# Patient Record
Sex: Female | Born: 1950 | Race: White | Hispanic: No
Health system: Midwestern US, Community
[De-identification: ages and names within clinical notes are randomized; demographics above are authoritative.]

## PROBLEM LIST (undated history)

## (undated) ENCOUNTER — Emergency Department: Disposition: A | Discharge status: Home / Self Care | Payer: Medicare HMO

## (undated) DIAGNOSIS — I10 Essential (primary) hypertension: Secondary | ICD-10-CM

## (undated) DIAGNOSIS — R2689 Other abnormalities of gait and mobility: Secondary | ICD-10-CM

## (undated) DIAGNOSIS — I779 Disorder of arteries and arterioles, unspecified: Secondary | ICD-10-CM

## (undated) DIAGNOSIS — F32A Depression, unspecified: Secondary | ICD-10-CM

## (undated) DIAGNOSIS — R42 Dizziness and giddiness: Secondary | ICD-10-CM

## (undated) DIAGNOSIS — I7090 Unspecified atherosclerosis: Secondary | ICD-10-CM

## (undated) DIAGNOSIS — I639 Cerebral infarction, unspecified: Secondary | ICD-10-CM

## (undated) DIAGNOSIS — K219 Gastro-esophageal reflux disease without esophagitis: Secondary | ICD-10-CM

## (undated) DIAGNOSIS — M1990 Unspecified osteoarthritis, unspecified site: Secondary | ICD-10-CM

## (undated) DIAGNOSIS — F419 Anxiety disorder, unspecified: Secondary | ICD-10-CM

## (undated) DIAGNOSIS — M810 Age-related osteoporosis without current pathological fracture: Secondary | ICD-10-CM

## (undated) DIAGNOSIS — G43909 Migraine, unspecified, not intractable, without status migrainosus: Secondary | ICD-10-CM

## (undated) DIAGNOSIS — K2270 Barrett's esophagus without dysplasia: Secondary | ICD-10-CM

## (undated) DIAGNOSIS — E785 Hyperlipidemia, unspecified: Secondary | ICD-10-CM

## (undated) DIAGNOSIS — I739 Peripheral vascular disease, unspecified: Secondary | ICD-10-CM

## (undated) DIAGNOSIS — F319 Bipolar disorder, unspecified: Secondary | ICD-10-CM

## (undated) DIAGNOSIS — D649 Anemia, unspecified: Secondary | ICD-10-CM

## (undated) DIAGNOSIS — F329 Major depressive disorder, single episode, unspecified: Secondary | ICD-10-CM

## (undated) DIAGNOSIS — M24529 Contracture, unspecified elbow: Secondary | ICD-10-CM

## (undated) DIAGNOSIS — C801 Malignant (primary) neoplasm, unspecified: Secondary | ICD-10-CM

## (undated) DIAGNOSIS — I8290 Acute embolism and thrombosis of unspecified vein: Secondary | ICD-10-CM

## (undated) DIAGNOSIS — R0981 Nasal congestion: Secondary | ICD-10-CM

## (undated) DIAGNOSIS — G459 Transient cerebral ischemic attack, unspecified: Secondary | ICD-10-CM

## (undated) HISTORY — DX: Dizziness and giddiness: R42

## (undated) HISTORY — PX: CHOLECYSTECTOMY: SHX55

## (undated) HISTORY — PX: BREAST BIOPSY: SHX20

## (undated) HISTORY — PX: ABDOMINAL HYSTERECTOMY: SHX81

## (undated) HISTORY — PX: CATARACT EXTRACTION W/ INTRAOCULAR LENS  IMPLANT, BILATERAL: SHX1307

## (undated) HISTORY — PX: EYE SURGERY: SHX253

## (undated) HISTORY — PX: JOINT REPLACEMENT: SHX530

## (undated) HISTORY — DX: Depression, unspecified: F32.A

## (undated) HISTORY — DX: Barrett's esophagus without dysplasia: K22.70

## (undated) HISTORY — PX: BACK SURGERY: SHX140

## (undated) HISTORY — DX: Migraine, unspecified, not intractable, without status migrainosus: G43.909

## (undated) HISTORY — PX: KYPHOPLASTY: SHX5884

## (undated) HISTORY — PX: OTHER SURGICAL HISTORY: SHX169

## (undated) HISTORY — PX: CARPAL TUNNEL RELEASE: SHX101

## (undated) HISTORY — PX: TONSILLECTOMY: SUR1361

## (undated) HISTORY — PX: FOOT SURGERY: SHX648

## (undated) HISTORY — PX: TOTAL KNEE ARTHROPLASTY: SHX125

## (undated) HISTORY — PX: KNEE SURGERY: SHX244

## (undated) HISTORY — DX: Anxiety disorder, unspecified: F41.9

## (undated) HISTORY — DX: Major depressive disorder, single episode, unspecified: F32.9

## (undated) MED FILL — Iron Sucrose Inj 20 MG/ML (Fe Equiv): INTRAVENOUS | Qty: 15 | Status: AC

---

## 2014-04-17 ENCOUNTER — Inpatient Hospital Stay
Admit: 2014-04-17 | Discharge: 2014-04-17 | Disposition: A | Discharge status: Home / Self Care | Payer: MEDICARE | Attending: Emergency Medicine

## 2014-04-17 ENCOUNTER — Emergency Department: Admit: 2014-04-17 | Payer: MEDICARE

## 2014-04-17 DIAGNOSIS — G43809 Other migraine, not intractable, without status migrainosus: Secondary | ICD-10-CM

## 2014-04-17 LAB — CBC WITH AUTOMATED DIFF
ABS. BASOPHILS: 0 10*3/uL (ref 0.0–0.2)
ABS. EOSINOPHILS: 0.1 10*3/uL (ref 0.0–0.8)
ABS. IMM. GRANS.: 0 10*3/uL (ref 0.0–0.5)
ABS. LYMPHOCYTES: 2.2 10*3/uL (ref 0.5–4.6)
ABS. MONOCYTES: 0.4 10*3/uL (ref 0.1–1.3)
ABS. NEUTROPHILS: 5.9 10*3/uL (ref 1.7–8.2)
BASOPHILS: 1 % (ref 0.0–2.0)
EOSINOPHILS: 1 % (ref 0.5–7.8)
HCT: 41.1 % (ref 35.8–46.3)
HGB: 13.4 g/dL (ref 11.7–15.4)
IMMATURE GRANULOCYTES: 0.2 % (ref 0.0–5.0)
LYMPHOCYTES: 26 % (ref 13–44)
MCH: 27.5 PG (ref 26.1–32.9)
MCHC: 32.6 g/dL (ref 31.4–35.0)
MCV: 84.4 FL (ref 79.6–97.8)
MONOCYTES: 4 % (ref 4.0–12.0)
MPV: 9.5 FL — ABNORMAL LOW (ref 10.8–14.1)
NEUTROPHILS: 68 % (ref 43–78)
PLATELET: 310 10*3/uL (ref 150–450)
RBC: 4.87 M/uL (ref 4.05–5.25)
RDW: 15.1 % — ABNORMAL HIGH (ref 11.9–14.6)
WBC: 8.7 10*3/uL (ref 4.3–11.1)

## 2014-04-17 LAB — METABOLIC PANEL, COMPREHENSIVE
A-G Ratio: 0.9 — ABNORMAL LOW (ref 1.2–3.5)
ALT (SGPT): 23 U/L (ref 12–65)
AST (SGOT): 23 U/L (ref 15–37)
Albumin: 3.6 g/dL (ref 3.2–4.6)
Alk. phosphatase: 100 U/L (ref 50–136)
Anion gap: 4 mmol/L — ABNORMAL LOW (ref 7–16)
BUN: 6 MG/DL — ABNORMAL LOW (ref 8–23)
Bilirubin, total: 0.3 MG/DL (ref 0.2–1.1)
CO2: 27 mmol/L (ref 21–32)
Calcium: 8.4 MG/DL (ref 8.3–10.4)
Chloride: 106 mmol/L (ref 98–107)
Creatinine: 0.93 MG/DL (ref 0.6–1.0)
GFR est AA: >60 mL/min/{1.73_m2} (ref >60)
GFR est non-AA: >60 mL/min/{1.73_m2} (ref >60)
Globulin: 3.8 g/dL — ABNORMAL HIGH (ref 2.3–3.5)
Glucose: 94 mg/dL (ref 65–100)
Potassium: 3.5 mmol/L (ref 3.5–5.1)
Protein, total: 7.4 g/dL (ref 6.3–8.2)
Sodium: 137 mmol/L (ref 136–145)

## 2014-04-17 LAB — TROPONIN I: Troponin-I, Qt.: <0.02 NG/ML — ABNORMAL LOW (ref 0.02–0.05)

## 2014-04-17 MED ORDER — SODIUM CHLORIDE 0.9 % IJ SYRG
INTRAMUSCULAR | Status: DC | PRN
Start: 2014-04-17 — End: 2014-04-17

## 2014-04-17 MED ORDER — METOCLOPRAMIDE 5 MG/ML IJ SOLN
5 mg/mL | INTRAMUSCULAR | Status: AC
Start: 2014-04-17 — End: 2014-04-17
  Administered 2014-04-17: 19:00:00 via INTRAVENOUS

## 2014-04-17 MED ORDER — BUTALBITAL-ACETAMINOPHEN-CAFFEINE 50 MG-325 MG-40 MG TAB
50-325-40 mg | ORAL_TABLET | Freq: Three times a day (TID) | ORAL | Status: AC
Start: 2014-04-17 — End: ?

## 2014-04-17 MED ORDER — SODIUM CHLORIDE 0.9 % IJ SYRG
Freq: Three times a day (TID) | INTRAMUSCULAR | Status: DC
Start: 2014-04-17 — End: 2014-04-17

## 2014-04-17 MED ORDER — DIPHENHYDRAMINE HCL 50 MG/ML IJ SOLN
50 mg/mL | INTRAMUSCULAR | Status: AC
Start: 2014-04-17 — End: 2014-04-17
  Administered 2014-04-17: 19:00:00 via INTRAVENOUS

## 2014-04-17 MED ORDER — SODIUM CHLORIDE 0.9% BOLUS IV
0.9 % | Freq: Once | INTRAVENOUS | Status: AC
Start: 2014-04-17 — End: 2014-04-17
  Administered 2014-04-17: 19:00:00 via INTRAVENOUS

## 2014-04-17 MED FILL — METOCLOPRAMIDE 5 MG/ML IJ SOLN: 5 mg/mL | INTRAMUSCULAR | Qty: 2

## 2014-04-17 MED FILL — DIPHENHYDRAMINE HCL 50 MG/ML IJ SOLN: 50 mg/mL | INTRAMUSCULAR | Qty: 1

## 2014-04-17 NOTE — ED Notes (Signed)
I have reviewed the discharge instructions with that patient. Patient verbalizes understanding. Patient ambulated out of ED in no apparent acute distress.

## 2014-04-17 NOTE — ED Notes (Signed)
Patient to radiology
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
## 2014-04-17 NOTE — ED Notes (Addendum)
Patient has a service dog with her. Her name is Denise Owens.    Patient medicated per orders. Lights dimmed. IV infusing.
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
## 2014-07-22 ENCOUNTER — Emergency Department: Payer: Self-pay | Admitting: Emergency Medicine

## 2014-09-29 DIAGNOSIS — M899 Disorder of bone, unspecified: Secondary | ICD-10-CM | POA: Insufficient documentation

## 2014-09-29 DIAGNOSIS — M949 Disorder of cartilage, unspecified: Secondary | ICD-10-CM

## 2014-09-29 HISTORY — DX: Disorder of bone, unspecified: M89.9

## 2014-11-24 DIAGNOSIS — M19019 Primary osteoarthritis, unspecified shoulder: Secondary | ICD-10-CM | POA: Insufficient documentation

## 2014-12-22 ENCOUNTER — Encounter: Payer: Self-pay | Admitting: Emergency Medicine

## 2014-12-22 ENCOUNTER — Inpatient Hospital Stay
Admission: EM | Admit: 2014-12-22 | Discharge: 2014-12-23 | DRG: 069 | Disposition: A | Discharge status: Home / Self Care | Payer: Medicare Other | Attending: Internal Medicine | Admitting: Internal Medicine

## 2014-12-22 ENCOUNTER — Emergency Department: Payer: Medicare Other

## 2014-12-22 ENCOUNTER — Inpatient Hospital Stay: Payer: Medicare Other

## 2014-12-22 DIAGNOSIS — K580 Irritable bowel syndrome with diarrhea: Secondary | ICD-10-CM | POA: Diagnosis present

## 2014-12-22 DIAGNOSIS — F419 Anxiety disorder, unspecified: Secondary | ICD-10-CM | POA: Diagnosis present

## 2014-12-22 DIAGNOSIS — F17200 Nicotine dependence, unspecified, uncomplicated: Secondary | ICD-10-CM | POA: Diagnosis present

## 2014-12-22 DIAGNOSIS — I639 Cerebral infarction, unspecified: Secondary | ICD-10-CM | POA: Diagnosis not present

## 2014-12-22 DIAGNOSIS — I10 Essential (primary) hypertension: Secondary | ICD-10-CM | POA: Diagnosis present

## 2014-12-22 DIAGNOSIS — Z8249 Family history of ischemic heart disease and other diseases of the circulatory system: Secondary | ICD-10-CM | POA: Diagnosis not present

## 2014-12-22 DIAGNOSIS — G459 Transient cerebral ischemic attack, unspecified: Secondary | ICD-10-CM | POA: Diagnosis present

## 2014-12-22 DIAGNOSIS — F418 Other specified anxiety disorders: Secondary | ICD-10-CM | POA: Diagnosis present

## 2014-12-22 DIAGNOSIS — Z886 Allergy status to analgesic agent status: Secondary | ICD-10-CM

## 2014-12-22 DIAGNOSIS — Z9049 Acquired absence of other specified parts of digestive tract: Secondary | ICD-10-CM | POA: Diagnosis present

## 2014-12-22 DIAGNOSIS — R42 Dizziness and giddiness: Secondary | ICD-10-CM | POA: Diagnosis present

## 2014-12-22 HISTORY — DX: Essential (primary) hypertension: I10

## 2014-12-22 HISTORY — DX: Disorder of arteries and arterioles, unspecified: I77.9

## 2014-12-22 HISTORY — DX: Peripheral vascular disease, unspecified: I73.9

## 2014-12-22 HISTORY — DX: Transient cerebral ischemic attack, unspecified: G45.9

## 2014-12-22 HISTORY — DX: Acute embolism and thrombosis of unspecified vein: I82.90

## 2014-12-22 LAB — COMPREHENSIVE METABOLIC PANEL
ALBUMIN: 3.7 g/dL (ref 3.5–5.0)
ALK PHOS: 119 U/L (ref 38–126)
ALT: 11 U/L — AB (ref 14–54)
ANION GAP: 7 (ref 5–15)
AST: 21 U/L (ref 15–41)
BUN: 10 mg/dL (ref 6–20)
CALCIUM: 8.9 mg/dL (ref 8.9–10.3)
CO2: 25 mmol/L (ref 22–32)
CREATININE: 1.01 mg/dL — AB (ref 0.44–1.00)
Chloride: 105 mmol/L (ref 101–111)
GFR calc Af Amer: >60 mL/min (ref >60)
GFR calc non Af Amer: 58 mL/min — ABNORMAL LOW (ref >60)
GLUCOSE: 94 mg/dL (ref 65–99)
Potassium: 3.6 mmol/L (ref 3.5–5.1)
SODIUM: 137 mmol/L (ref 135–145)
Total Bilirubin: 0.2 mg/dL — ABNORMAL LOW (ref 0.3–1.2)
Total Protein: 6.8 g/dL (ref 6.5–8.1)

## 2014-12-22 LAB — DIFFERENTIAL
Basophils Absolute: 0.2 10*3/uL — ABNORMAL HIGH (ref 0–0.1)
Basophils Relative: 3 %
Eosinophils Absolute: 0.3 10*3/uL (ref 0–0.7)
Eosinophils Relative: 4 %
LYMPHS PCT: 29 %
Lymphs Abs: 2 10*3/uL (ref 1.0–3.6)
Monocytes Absolute: 0.4 10*3/uL (ref 0.2–0.9)
Monocytes Relative: 5 %
NEUTROS ABS: 4.2 10*3/uL (ref 1.4–6.5)
NEUTROS PCT: 59 %

## 2014-12-22 LAB — CBC
HCT: 36.9 % (ref 35.0–47.0)
Hemoglobin: 12.1 g/dL (ref 12.0–16.0)
MCH: 27.9 pg (ref 26.0–34.0)
MCHC: 32.7 g/dL (ref 32.0–36.0)
MCV: 85.1 fL (ref 80.0–100.0)
PLATELETS: 350 10*3/uL (ref 150–440)
RBC: 4.33 MIL/uL (ref 3.80–5.20)
RDW: 14.7 % — ABNORMAL HIGH (ref 11.5–14.5)
WBC: 7 10*3/uL (ref 3.6–11.0)

## 2014-12-22 LAB — GLUCOSE, CAPILLARY: Glucose-Capillary: 121 mg/dL — ABNORMAL HIGH (ref 65–99)

## 2014-12-22 LAB — PROTIME-INR
INR: 0.89
PROTHROMBIN TIME: 12.3 s (ref 11.4–15.0)

## 2014-12-22 LAB — APTT: aPTT: 25 seconds (ref 24–36)

## 2014-12-22 MED ORDER — LORATADINE 10 MG PO TABS
10.0000 mg | ORAL_TABLET | Freq: Every day | ORAL | Status: DC
  Administered 2014-12-23: 11:00:00 10 mg via ORAL
  Filled 2014-12-22: qty 1

## 2014-12-22 MED ORDER — ASPIRIN 81 MG PO CHEW
324.0000 mg | CHEWABLE_TABLET | Freq: Once | ORAL | Status: AC
  Administered 2014-12-22: 324 mg via ORAL
  Filled 2014-12-22: qty 4

## 2014-12-22 MED ORDER — STROKE: EARLY STAGES OF RECOVERY BOOK
Freq: Once | Status: AC
  Administered 2014-12-22: 21:00:00

## 2014-12-22 MED ORDER — PANTOPRAZOLE SODIUM 40 MG PO TBEC
40.0000 mg | DELAYED_RELEASE_TABLET | Freq: Every day | ORAL | Status: DC
  Administered 2014-12-23: 40 mg via ORAL
  Filled 2014-12-22: qty 1

## 2014-12-22 MED ORDER — FAMOTIDINE 20 MG PO TABS
20.0000 mg | ORAL_TABLET | Freq: Two times a day (BID) | ORAL | Status: DC
  Administered 2014-12-22 – 2014-12-23 (×2): 20 mg via ORAL
  Filled 2014-12-22 (×2): qty 1

## 2014-12-22 MED ORDER — CITALOPRAM HYDROBROMIDE 20 MG PO TABS
40.0000 mg | ORAL_TABLET | Freq: Every day | ORAL | Status: DC
  Administered 2014-12-23: 40 mg via ORAL
  Filled 2014-12-22: qty 2

## 2014-12-22 MED ORDER — CALCIUM CARBONATE 600 MG PO TABS
600.0000 mg | ORAL_TABLET | Freq: Two times a day (BID) | ORAL | Status: DC
  Filled 2014-12-22 (×4): qty 1

## 2014-12-22 MED ORDER — SENNOSIDES-DOCUSATE SODIUM 8.6-50 MG PO TABS: 1.0000 | ORAL_TABLET | Freq: Every evening | ORAL | Status: DC | PRN

## 2014-12-22 MED ORDER — ATORVASTATIN CALCIUM 20 MG PO TABS
40.0000 mg | ORAL_TABLET | Freq: Every day | ORAL | Status: DC
  Administered 2014-12-22: 40 mg via ORAL
  Filled 2014-12-22 (×2): qty 2

## 2014-12-22 MED ORDER — ENOXAPARIN SODIUM 30 MG/0.3ML ~~LOC~~ SOLN
30.0000 mg | SUBCUTANEOUS | Status: DC
  Administered 2014-12-22: 21:00:00 30 mg via SUBCUTANEOUS
  Filled 2014-12-22: qty 0.3

## 2014-12-22 MED ORDER — CALCIUM CARBONATE-VITAMIN D 500-200 MG-UNIT PO TABS
1.0000 | ORAL_TABLET | Freq: Two times a day (BID) | ORAL | Status: DC
  Administered 2014-12-23: 1 via ORAL
  Filled 2014-12-22: qty 1

## 2014-12-22 MED ORDER — IBUPROFEN 400 MG PO TABS
800.0000 mg | ORAL_TABLET | Freq: Three times a day (TID) | ORAL | Status: DC | PRN
  Administered 2014-12-22: 19:00:00 800 mg via ORAL
  Filled 2014-12-22: qty 2

## 2014-12-22 MED ORDER — LISINOPRIL 20 MG PO TABS
20.0000 mg | ORAL_TABLET | Freq: Every day | ORAL | Status: DC
  Administered 2014-12-23: 11:00:00 20 mg via ORAL
  Filled 2014-12-22: qty 1

## 2014-12-22 MED ORDER — HYDRALAZINE HCL 20 MG/ML IJ SOLN
10.0000 mg | Freq: Four times a day (QID) | INTRAMUSCULAR | Status: DC | PRN
  Administered 2014-12-22: 10 mg via INTRAVENOUS
  Filled 2014-12-22: qty 1

## 2014-12-22 MED ORDER — DICLOFENAC SODIUM 25 MG PO TBEC
75.0000 mg | DELAYED_RELEASE_TABLET | Freq: Two times a day (BID) | ORAL | Status: DC
  Administered 2014-12-22 – 2014-12-23 (×2): 75 mg via ORAL
  Filled 2014-12-22 (×3): qty 1

## 2014-12-22 MED ORDER — LAMOTRIGINE 100 MG PO TABS
200.0000 mg | ORAL_TABLET | Freq: Two times a day (BID) | ORAL | Status: DC
  Administered 2014-12-22 – 2014-12-23 (×2): 200 mg via ORAL
  Filled 2014-12-22 (×2): qty 2

## 2014-12-22 NOTE — Progress Notes (Signed)
Per Dr Posey Pronto pt can have a 2gm sodium diet

## 2014-12-22 NOTE — ED Notes (Signed)
Pt comes into the ED c/o headache, dizziness, blurred vision, and headache.  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  Patient A&Ox4, no deficits present, EKG unremarkable.  Patient states the symptoms began around 9:00 this morning.  States "my previous TIA's never last this long".
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
## 2014-12-22 NOTE — H&P (Cosign Needed)
[place] [facility] [place] NAME: [facility]    MR#:  [id number]  DATE OF BIRTH:  [birth year]  DATE OF ADMISSION:  12/22/2014  PRIMARY CARE PHYSICIAN: Loistine Chance, MD   REQUESTING/REFERRING PHYSICIAN: [name] M.D  CHIEF COMPLAINT:   Chief Complaint  Patient presents with  . Dizziness  . Headache  . Hypertension    HISTORY OF PRESENT ILLNESS: [facility]  is a 64 y.o. female with a known history of  multiple TIAs, hypertension, right carotid artery stenosis who brought into the emergency room with complaint of feeling dizziness since this morning. She woke up with this. She also was having weakness in the left upper and left lower extremity. She also has been feeling off balance on her feet. She was also having difficulty with her speech having trouble finding words. She came to the emergency room had a CT scan which showed old stroke. Patient is not happy about being admitted to the hospital. She otherwise denies any chest pain shortness of breath or palpitations. She also reports that she was told that she has severe right-sided carotid stenosis 3 [place] and was told that she would only live 6 months. She states that she stopped taking aspirin because she thought that it would not do her any good.       PAST MEDICAL HISTORY:   Past Medical History  Diagnosis Date  . Hypertension   . TIA (transient ischemic attack)   . Blood clot in vein   . Carotid arterial disease     PAST SURGICAL HISTORY:  Past Surgical History  Procedure Laterality Date  . Cholecystectomy    . Appendectomy    . Foot surgery    . Knee surgery    . Femoral fx      SOCIAL HISTORY:  Social History  Substance Use Topics  . Smoking status: Current Every Day Smoker -- 1.00 packs/day  . Smokeless tobacco: Not on file  . Alcohol Use: No    FAMILY HISTORY:  Family History  Problem Relation Age of Onset  . CAD Mother      DRUG ALLERGIES:  Allergies  Allergen Reactions  . Percocet [Oxycodone-Acetaminophen] Itching and Rash    REVIEW OF SYSTEMS:   CONSTITUTIONAL: No fever, fatigue or weakness.  EYES: No blurred or double vision.  EARS, NOSE, AND THROAT: No tinnitus or ear pain.  RESPIRATORY: No cough, shortness of breath, wheezing or hemoptysis.  CARDIOVASCULAR: No chest pain, orthopnea, edema.  GASTROINTESTINAL: No nausea, vomiting, diarrhea or abdominal pain.  GENITOURINARY: No dysuria, hematuria.  ENDOCRINE: No polyuria, nocturia,  HEMATOLOGY: No anemia, easy bruising or bleeding SKIN: No rash or lesion. MUSCULOSKELETAL: No joint pain or arthritis.   NEUROLOGIC: Positive Left upper extremity left lower extremity weakness and speech difficulties dizziness and gait instability PSYCHIATRY: Positive anxiety  and depression.   MEDICATIONS AT HOME:  Prior to Admission medications   Medication Sig Start Date End Date Taking? Authorizing Provider  calcium carbonate (OS-CAL) 600 MG TABS tablet Take 600 mg by mouth 2 (two) times daily.   Yes Historical Provider, MD  cetirizine (ZYRTEC) 10 MG tablet Take 10 mg by mouth daily as needed for allergies.   Yes Historical Provider, MD  citalopram (CELEXA) 40 MG tablet Take 40 mg by mouth daily.   Yes Historical Provider, MD  diclofenac (VOLTAREN) 75 MG EC tablet Take 75 mg by mouth 2 (two) times daily.  Yes Historical Provider, MD  ibuprofen (ADVIL,MOTRIN) 800 MG tablet Take 800 mg by mouth 3 (three) times daily as needed for mild pain.   Yes Historical Provider, MD  lamoTRIgine (LAMICTAL) 200 MG tablet Take 200 mg by mouth 2 (two) times daily.   Yes Historical Provider, MD  lisinopril (PRINIVIL,ZESTRIL) 20 MG tablet Take 20 mg by mouth daily.   Yes Historical Provider, MD  omeprazole (PRILOSEC) 40 MG capsule Take 40 mg by mouth daily.   Yes Historical Provider, MD  ranitidine (ZANTAC) 150 MG tablet Take 150 mg by mouth daily as needed for heartburn.   Yes  Historical Provider, MD      PHYSICAL EXAMINATION:   VITAL SIGNS: Blood pressure 203/70, pulse 65, temperature 98.4 F (36.9 C), resp. rate 20, SpO2 97 %.  GENERAL:  64 y.o.-year-old patient lying in the bed with no acute distress.  EYES: Pupils equal, round, reactive to light and accommodation. No scleral icterus. Extraocular muscles intact.  HEENT: Head atraumatic, normocephalic. Oropharynx and nasopharynx clear.  NECK:  Supple, no jugular venous distention. No thyroid enlargement, no tenderness.  LUNGS: Normal breath sounds bilaterally, no wheezing, rales,rhonchi or crepitation. No use of accessory muscles of respiration.  CARDIOVASCULAR: S1, S2 normal. No murmurs, rubs, or gallops.  ABDOMEN: Soft, nontender, nondistended. Bowel sounds present. No organomegaly or mass.  EXTREMITIES: No pedal edema, cyanosis, or clubbing.  NEUROLOGIC: Cranial nerves II through XII are intact , strength is 4 out of 5 in the left upper extremity and left lower extremity. Babinski is downgoing reflexes 2+ sensation intact  PSYCHIATRIC: The patient is alert and oriented x 3. Appears little anxious SKIN: No obvious rash, lesion, or ulcer.   LABORATORY PANEL:   CBC  Recent Labs Lab 12/22/14 1357  WBC 7.0  HGB 12.1  HCT 36.9  PLT 350  MCV 85.1  MCH 27.9  MCHC 32.7  RDW 14.7*  LYMPHSABS 2.0  MONOABS 0.4  EOSABS 0.3  BASOSABS 0.2*   ------------------------------------------------------------------------------------------------------------------  Chemistries   Recent Labs Lab 12/22/14 1357  NA 137  K 3.6  CL 105  CO2 25  GLUCOSE 94  BUN 10  CREATININE 1.01*  CALCIUM 8.9  AST 21  ALT 11*  ALKPHOS 119  BILITOT 0.2*   ------------------------------------------------------------------------------------------------------------------ CrCl cannot be calculated (Unknown ideal  weight.). ------------------------------------------------------------------------------------------------------------------ No results for input(s): TSH, T4TOTAL, T3FREE, THYROIDAB in the last 72 hours.  Invalid input(s): FREET3   Coagulation profile  Recent Labs Lab 12/22/14 1357  INR 0.89   ------------------------------------------------------------------------------------------------------------------- No results for input(s): DDIMER in the last 72 hours. -------------------------------------------------------------------------------------------------------------------  Cardiac Enzymes No results for input(s): CKMB, TROPONINI, MYOGLOBIN in the last 168 hours.  Invalid input(s): CK ------------------------------------------------------------------------------------------------------------------ Invalid input(s): POCBNP  ---------------------------------------------------------------------------------------------------------------  Urinalysis No results found for: COLORURINE, APPEARANCEUR, LABSPEC, PHURINE, GLUCOSEU, HGBUR, BILIRUBINUR, KETONESUR, PROTEINUR, UROBILINOGEN, NITRITE, LEUKOCYTESUR   RADIOLOGY: Ct Head Wo Contrast  12/22/2014   CLINICAL DATA:  Dizziness and headache.  Code stroke.  Hypertension.  EXAM: CT HEAD WITHOUT CONTRAST  TECHNIQUE: Contiguous axial images were obtained from the base of the skull through the vertex without intravenous contrast.  COMPARISON:  None.  FINDINGS: 2.1 by 0.9 cm hypodensity in the right parietal vertex favoring old stroke.  Otherwise, the brainstem, cerebellum, cerebral peduncles, thalamus, basal ganglia, basilar cisterns, and ventricular system appear within normal limits. No intracranial hemorrhage, mass lesion, or acute CVA.  There is atherosclerotic calcification of the cavernous carotid arteries bilaterally.  IMPRESSION: 1. Remote appearing 2 cm right parietal vertex stroke.  No acute intracranial findings are identified. If the  These results were called by telephone at the time of interpretation on 12/22/2014 at 2:39 pm to Dr. Lavonia Drafts , who verbally acknowledged these results.   Electronically Signed   By: Van Clines M.D.   On: 12/22/2014 14:42    EKG: Orders placed or performed during the hospital encounter of 12/22/14  . ED EKG  . ED EKG    IMPRESSION AND PLAN: Patient is a 64 year old white female with history of TIA, carotid arteries stenosis presents with stroke like symptoms  1. Acute CVA; admit patient, obtain MRI of the brain, carotid Dopplers, start patient on aspirin, statin fasting lipid panel in the morning, PT evaluation  2. Accelerated hypertension: Continue lisinopril, I will add hydralazine for extremely elevated high blood pressure due to acute CVA  3. Depression and anxiety continue citalopram, and Lamictal  4. Nicotine addiction smoking cessation provided for minutes spent strongly recommended patient stop smoking offered her nicotine patch she states that absolutely not, she is not willing to quit smoking      All the records are reviewed and case discussed with ED provider. Management plans discussed with the patient, family and they are in agreement.  CODE STATUS: Advance Directive Documentation        Most Recent Value   Type of Advance Directive  Healthcare Power of Attorney, Living will, Out of facility DNR (pink MOST or yellow form)   Pre-existing out of facility DNR order (yellow form or pink MOST form)     "MOST" Form in Place?         TOTAL TIME TAKING CARE OF THIS PATIENT: 55 minutes.    Dustin Flock M.D on 12/22/2014 at 4:33 PM  Between 7am to 6pm - Pager - [phone]  After 6pm go to [email or web address] - password [email or web address] [facility]  Office  [phone]  CC: Primary care physician; Loistine Chance, MD

## 2014-12-23 ENCOUNTER — Inpatient Hospital Stay: Payer: Medicare Other

## 2014-12-23 ENCOUNTER — Inpatient Hospital Stay (HOSPITAL_COMMUNITY)
Admit: 2014-12-23 | Discharge: 2014-12-23 | Disposition: A | Discharge status: Home / Self Care | Payer: Medicare Other | Attending: Internal Medicine | Admitting: Internal Medicine

## 2014-12-23 DIAGNOSIS — I639 Cerebral infarction, unspecified: Secondary | ICD-10-CM

## 2014-12-23 DIAGNOSIS — G459 Transient cerebral ischemic attack, unspecified: Secondary | ICD-10-CM | POA: Diagnosis present

## 2014-12-23 DIAGNOSIS — I10 Essential (primary) hypertension: Secondary | ICD-10-CM

## 2014-12-23 LAB — C DIFFICILE QUICK SCREEN W PCR REFLEX
C Diff antigen: NEGATIVE
C Diff interpretation: NEGATIVE
C Diff toxin: NEGATIVE

## 2014-12-23 LAB — LIPID PANEL
Cholesterol: 236 mg/dL — ABNORMAL HIGH (ref 0–200)
HDL: 52 mg/dL (ref >40)
LDL Cholesterol: 154 mg/dL — ABNORMAL HIGH (ref 0–99)
Total CHOL/HDL Ratio: 4.5 RATIO
Triglycerides: 151 mg/dL — ABNORMAL HIGH (ref <150)
VLDL: 30 mg/dL (ref 0–40)

## 2014-12-23 LAB — HEMOGLOBIN A1C: HEMOGLOBIN A1C: 5.3 % (ref 4.0–6.0)

## 2014-12-23 MED ORDER — ACETAMINOPHEN 325 MG PO TABS
650.0000 mg | ORAL_TABLET | Freq: Four times a day (QID) | ORAL | Status: DC | PRN
Start: 2014-12-23 — End: 2014-12-23
  Administered 2014-12-23: 11:00:00 650 mg via ORAL
  Filled 2014-12-23: qty 2

## 2014-12-23 MED ORDER — ATORVASTATIN CALCIUM 40 MG PO TABS: 40.0000 mg | ORAL_TABLET | Freq: Every day | ORAL | Status: DC

## 2014-12-23 MED ORDER — METOPROLOL TARTRATE 25 MG PO TABS
25.0000 mg | ORAL_TABLET | Freq: Two times a day (BID) | ORAL | Status: DC
  Administered 2014-12-23: 15:00:00 25 mg via ORAL
  Filled 2014-12-23: qty 1

## 2014-12-23 MED ORDER — METOPROLOL TARTRATE 25 MG PO TABS: 25.0000 mg | ORAL_TABLET | Freq: Two times a day (BID) | ORAL | Status: DC

## 2014-12-23 MED ORDER — ASPIRIN EC 325 MG PO TBEC: 325.0000 mg | DELAYED_RELEASE_TABLET | Freq: Every day | ORAL | Status: DC

## 2014-12-23 MED ORDER — LISINOPRIL 20 MG PO TABS: 20.0000 mg | ORAL_TABLET | Freq: Every day | ORAL | Status: DC

## 2014-12-23 NOTE — Evaluation (Signed)
Occupational Therapy Evaluation Patient Details Name: Sydney Evans MRN: [id number] DOB: 02-May-1951 Today's Date: 12/23/2014    History of Present Illness Sydney Evans is a 64 y.o. female with a known history of multiple TIAs, hypertension, right carotid artery stenosis who brought into the emergency room with complaint of feeling dizziness since this morning. She woke up with this. She also was having weakness in the left upper and left lower extremity. She also has been feeling off balance on her feet. She was also having difficulty with her speech having trouble finding words. She came to the emergency room had a CT scan which showed old stroke. Patient is not happy about being admitted to the hospital. She otherwise denies any chest pain shortness of breath or palpitations. She also reports that she was told that she has severe right-sided carotid [place] and was told that she would only live 6 months. She lives with her daughter and her 3 children in a one story home.   Clinical Impression   Pt is 64 year old female who presents to [facility] hospital with dizziness and weakness in LUE and LLE. CT scan indicated an old CVA and no new infarct.  Pt is able to complete ADLs with supervision with slight balance issues with functional ambulation and rec use of FWW as rec by PT to prevent falls in the home.  She is able to complete UB and LB dressing from sitting with Supervision.  Also rec using a shower chair to prevent falls when washing her hair since dizziness increases with eyes closed.  No further OT needed and SW updated.    Follow Up Recommendations  No OT follow up    Equipment Recommendations       Recommendations for Other Services       Precautions / Restrictions Precautions Precautions:  (pt is a moderate fall risk) Restrictions Weight Bearing Restrictions: No      Mobility Bed Mobility                  Transfers                      Balance                                            ADL                                         General ADL Comments: Pt is able to complete ADLs with supervision only for balance and impulsivity.  Rec she use her FWW as rec by PT for functional mobility.  Also rec a shower chair to prevent falls in bathroom shower.  She has a grab bar to hold onto.     Vision     Perception     Praxis      Pertinent Vitals/Pain Pain Assessment: No/denies pain     Hand Dominance Right   Extremity/Trunk Assessment Upper Extremity Assessment Upper Extremity Assessment:  (Pt has decreased strength of 4-/5 in LUE, intact sensation, proprioception and fine motor coordination)   Lower Extremity Assessment Lower Extremity Assessment: Defer to PT evaluation       Communication Communication Communication: No difficulties   Cognition Arousal/Alertness: Awake/alert  Behavior During Therapy: WFL for tasks assessed/performed Overall Cognitive Status: Within Functional Limits for tasks assessed                     General Comments       Exercises       Shoulder Instructions      Home Living Family/patient expects to be discharged to:: Private residence Living Arrangements: Children Available Help at Discharge: Family Type of Home: House Home Access: Level entry     Home Layout: One level     Bathroom Shower/Tub: Occupational psychologist: Standard Bathroom Accessibility: No   Home Equipment: None   Additional Comments: rec pt use a shower chair to prevent falls esp when washing hair.      Prior Functioning/Environment Level of Independence: Independent             OT Diagnosis:     OT Problem List:     OT Treatment/Interventions:      OT Goals(Current goals can be found in the care plan section)    OT Frequency:     Barriers to D/C:            Co-evaluation              End of Session  Nurse Communication:  (pt left in chair without an alarm and NSG aware and OK with it.)  Activity Tolerance: Patient tolerated treatment well Patient left: in chair;with call bell/phone within reach   Time: 1355-1420 OT Time Calculation (min): 25 min Charges:  OT General Charges $OT Visit: 1 Procedure OT Evaluation $Initial OT Evaluation Tier I: 1 Procedure OT Treatments $Self Care/Home Management : 8-22 mins G-Codes:    Sydney Evans Dec 29, 2014, 3:02 PM    Chrys Racer, OTR/L ascom [phone]

## 2014-12-23 NOTE — Progress Notes (Signed)
PT Cancellation Note  Patient Details Name: Sydney Evans MRN: [id number] DOB: 01-23-1951   Cancelled Treatment:    Reason Eval/Treat Not Completed: Other (comment) (See PT note for further details). PT will hold this AM secondary to pt being out of the room for imaging. Will attempt at later time/date schedule depending.   Janyth Contes 12/23/2014, 9:27 AM  Janyth Contes, SPT. [phone]

## 2014-12-23 NOTE — Progress Notes (Signed)
Initial Nutrition Assessment   INTERVENTION:   Meals and Snacks: Cater to patient preferences Medical Food Supplement Therapy: will recommend on follow if intake poor.   NUTRITION DIAGNOSIS:   No nutrition diagnosis at this time  GOAL:   Patient will meet greater than or equal to 90% of their needs  MONITOR:    (Energy Intake, Anthropometrics, Digestive system)  REASON FOR ASSESSMENT:   Malnutrition Screening Tool    ASSESSMENT:   Sydney Evans admitted with acute CVA. Sydney Evans had just returned from MRI this am on visit.  Past Medical History  Diagnosis Date  . Hypertension   . TIA (transient ischemic attack)   . Blood clot in vein   . Carotid arterial disease      Diet Order:  Diet 2 gram sodium Room service appropriate?: Yes; Fluid consistency:: Thin Diet - low sodium heart healthy   Current Nutrition: Sydney Evans had been at  A procedure this am and had not eaten breakfast yet.   Food/Nutrition-Related History: Sydney Evans reports eating chicken and green beans last night for dinner. Sydney Evans reports appetite was good PTA eating usually 2 meals per day. Sydney Evans reports not drinking supplements PTA.   Medications: Protonix, calcium-vitamin D  Electrolyte/Renal Profile and Glucose Profile:   Recent Labs Lab 12/22/14 1357  NA 137  K 3.6  CL 105  CO2 25  BUN 10  CREATININE 1.01*  CALCIUM 8.9  GLUCOSE 94   Protein Profile:  Recent Labs Lab 12/22/14 1357  ALBUMIN 3.7    Gastrointestinal Profile: Last BM:  12/23/2014   Weight Change: Sydney Evans reports stable weight PTA. Per MST unsure of any weight decrease PTA. Sydney Evans reports UBW of 158-159lbs.  Height:   Ht Readings from Last 1 Encounters:  12/22/14 5\' 2"  (1.575 m)    Weight:   Wt Readings from Last 1 Encounters:  12/22/14 159 lb (72.122 kg)   BMI:  Body mass index is 29.07 kg/(m^2).  EDUCATION NEEDS:   No education needs identified at this time    [place], [place], LDN Pager [phone]

## 2014-12-23 NOTE — Care Management (Signed)
Admitted to [facility] with the diagnosis of CVA. Lives with daughter, Ted Mcalpine . See Dr. Ancil Boozer as primary care physician. Takes care of all activities of daily living herself. Home health in the past. Doesn't remember name of agency. No skiled facility. Physical therapy evaluation completed. Recommends home health and physical therapy in the home. Discussed home health agencies, [facility]. Dr. Tressia Miners updated. Discharge to home today, Shelbie Ammons RN MSN Care Management [phone]

## 2014-12-23 NOTE — Progress Notes (Signed)
*  PRELIMINARY RESULTS* Echocardiogram 2D Echocardiogram has been performed.  Sydney Evans Stills 12/23/2014, 9:17 AM

## 2014-12-23 NOTE — Plan of Care (Signed)
Problem: Discharge/Transitional Outcomes Goal: Other Discharge Outcomes/Goals Outcome: Progressing Plan of Care Progress to Goal:  Pt is improved from yesterday.  Deficits appear resolved. Still some facial sensation loss on L side of face.  Pt had MRI today - neg for acute infarcts but shows chronic R parietal lobe infarct.  U/S showed complete occlusion of R carotid artery.  Pt wked w/PT/OT - they recommend home w/PT/OT.  Metoprolol also added for BP.  Gave education on Ischemic Stroke and Prevention of stroke.  Emphasized being compliant w/her medication - particularly w/ aspirin and statin.  Had vascular surgical consult. Had been having loose stools - was negative for Cdiff. Tried to encourage pt to quit smoking - but she feels life is short and she's going to enjoy it.

## 2014-12-23 NOTE — Plan of Care (Signed)
Problem: Discharge/Transitional Outcomes Goal: Other Discharge Outcomes/Goals Outcome: Progressing Plan of care progress to goal: Pt continues to have slight drift in left arm and leg. Pt one assist to bathroom Diet - tolerating Stroke education reinforced with handout.

## 2014-12-23 NOTE — Progress Notes (Signed)
[place] [facility] [place] NAME: Sydney Evans    MR#:  [id number]  DATE OF BIRTH:  [birth year]  SUBJECTIVE:  CHIEF COMPLAINT:   Chief Complaint  Patient presents with  . Dizziness  . Headache  . Hypertension   - no dizziness or headache now. Left sided 4/5 weakness persists.  - MRI with no acute infarct - awaiting PT and OT consults - complained of diarrhea last night- stool studies ordered  REVIEW OF SYSTEMS:  Review of Systems  Constitutional: Negative for fever and chills.  HENT: Negative for ear pain.   Respiratory: Negative for cough, shortness of breath and wheezing.   Cardiovascular: Negative for chest pain and palpitations.  Gastrointestinal: Positive for diarrhea. Negative for nausea, vomiting, abdominal pain and constipation.  Genitourinary: Negative for dysuria.  Neurological: Positive for speech change, focal weakness and weakness. Negative for dizziness, tremors, sensory change, seizures and headaches.    DRUG ALLERGIES:   Allergies  Allergen Reactions  . Percocet [Oxycodone-Acetaminophen] Itching and Rash    VITALS:  Blood pressure 150/63, pulse 59, temperature 98 F (36.7 C), temperature source Oral, resp. rate 19, height 5\' 2"  (1.575 m), weight 72.122 kg (159 lb), SpO2 96 %.  PHYSICAL EXAMINATION:  Physical Exam  GENERAL:  64 y.o.-year-old patient lying in the bed with no acute distress.  EYES: Pupils equal, round, reactive to light and accommodation. No scleral icterus. Extraocular muscles intact.  HEENT: Head atraumatic, normocephalic. Oropharynx and nasopharynx clear.  NECK:  Supple, no jugular venous distention. No thyroid enlargement, no tenderness.  LUNGS: Normal breath sounds bilaterally, no wheezing, rales,rhonchi or crepitation. No use of accessory muscles of respiration.  CARDIOVASCULAR: S1, S2 normal. No murmurs, rubs, or gallops.  ABDOMEN: Soft, nontender, nondistended. Bowel sounds present.  No organomegaly or mass.  EXTREMITIES: No pedal edema, cyanosis, or clubbing.  NEUROLOGIC: Cranial nerves II through XII are intact. Muscle strength 5/5 in right sided extremities and 4/5 left upper and lower extremities. Sensation intact. Gait not checked.  PSYCHIATRIC: The patient is alert and oriented x 3.  SKIN: No obvious rash, lesion, or ulcer.    LABORATORY PANEL:   CBC  Recent Labs Lab 12/22/14 1357  WBC 7.0  HGB 12.1  HCT 36.9  PLT 350   ------------------------------------------------------------------------------------------------------------------  Chemistries   Recent Labs Lab 12/22/14 1357  NA 137  K 3.6  CL 105  CO2 25  GLUCOSE 94  BUN 10  CREATININE 1.01*  CALCIUM 8.9  AST 21  ALT 11*  ALKPHOS 119  BILITOT 0.2*   ------------------------------------------------------------------------------------------------------------------  Cardiac Enzymes No results for input(s): TROPONINI in the last 168 hours. ------------------------------------------------------------------------------------------------------------------  RADIOLOGY:  Ct Head Wo Contrast  12/22/2014   CLINICAL DATA:  Dizziness and headache.  Code stroke.  Hypertension.  EXAM: CT HEAD WITHOUT CONTRAST  TECHNIQUE: Contiguous axial images were obtained from the base of the skull through the vertex without intravenous contrast.  COMPARISON:  None.  FINDINGS: 2.1 by 0.9 cm hypodensity in the right parietal vertex favoring old stroke.  Otherwise, the brainstem, cerebellum, cerebral peduncles, thalamus, basal ganglia, basilar cisterns, and ventricular system appear within normal limits. No intracranial hemorrhage, mass lesion, or acute CVA.  There is atherosclerotic calcification of the cavernous carotid arteries bilaterally.  IMPRESSION: 1. Remote appearing 2 cm right parietal vertex stroke. No acute intracranial findings are identified. If the These results were called by telephone at the time of  interpretation on 12/22/2014 at 2:39 pm to Dr.  Lavonia Drafts , who verbally acknowledged these results.   Electronically Signed   By: Van Clines M.D.   On: 12/22/2014 14:42   Mr Jodene Nam Head Wo Contrast  12/23/2014   CLINICAL DATA:  Headache with blurred vision. Hypertension. Balance problems. History is of TIA.  EXAM: MRI HEAD WITHOUT CONTRAST  MRA HEAD WITHOUT CONTRAST  TECHNIQUE: Multiplanar, multiecho pulse sequences of the brain and surrounding structures were obtained without intravenous contrast. Angiographic images of the head were obtained using MRA technique without contrast.  COMPARISON:  Head CT 12/22/2014  FINDINGS: MRI HEAD FINDINGS  Some sequences are mildly to moderately motion degraded, particularly the FLAIR and coronal T2 images.  There is no evidence of acute infarct, intracranial hemorrhage, mass, midline shift, or extra-axial fluid collection. Ventricles and sulci are normal. A small, chronic right parietal cortical infarct is again noted. There are a few small foci of T2 hyperintensity in the cerebral white matter bilaterally, nonspecific. Ventricles and sulci are within normal limits for age.  Prior bilateral cataract extraction is noted. Minimal paranasal sinus mucosal thickening is noted. Mastoid air cells are clear. Absent distal right internal carotid artery flow void is more fully evaluated below.  MRA HEAD FINDINGS  Study is moderately motion degraded.  Visualized distal vertebral arteries are patent and codominant. Left PICA origin is patent. Right AICA is dominant and supplies the PICA. SCA origins are patent. Basilar artery is patent without stenosis. There is a patent right posterior communicating artery. PCAs are patent without evidence of significant stenosis.  The left internal carotid artery is patent from skullbase to carotid terminus without evidence of flow limiting stenosis and with evaluation for milder stenosis limited by motion artifact. No flow related enhancement  is seen within the intracranial or visualized distal cervical portions of the right ICA. There is reconstitution of flow at the right carotid terminus likely via the posterior communicating artery.  Gross flow is seen in the right A1 segment, however evaluation is limited by motion artifact. There is likely a patent anterior communicating artery providing cross flow. Left A1 segment is also grossly patent but not well evaluated due to motion. Both M1 segments are patent without evidence of high-grade stenosis. Proximal M2 trunks are grossly patent bilaterally. There is a mildly decreased number of smaller, more distal right MCA branch vessels likely related to the chronic parietal infarct. The A2 segments are patent with evaluation for stenosis limited by artifact.  IMPRESSION: 1. No acute intracranial abnormality. 2. Chronic right parietal cortical infarct. 3. Moderately motion degraded MRA, limiting evaluation for stenosis. Right internal carotid artery is occluded more proximally in the neck with reconstitution at the level of the carotid terminus.   Electronically Signed   By: Logan Bores   On: 12/23/2014 10:42   Mr Brain Wo Contrast  12/23/2014   CLINICAL DATA:  Headache with blurred vision. Hypertension. Balance problems. History is of TIA.  EXAM: MRI HEAD WITHOUT CONTRAST  MRA HEAD WITHOUT CONTRAST  TECHNIQUE: Multiplanar, multiecho pulse sequences of the brain and surrounding structures were obtained without intravenous contrast. Angiographic images of the head were obtained using MRA technique without contrast.  COMPARISON:  Head CT 12/22/2014  FINDINGS: MRI HEAD FINDINGS  Some sequences are mildly to moderately motion degraded, particularly the FLAIR and coronal T2 images.  There is no evidence of acute infarct, intracranial hemorrhage, mass, midline shift, or extra-axial fluid collection. Ventricles and sulci are normal. A small, chronic right parietal cortical infarct is again noted. There  are a few  small foci of T2 hyperintensity in the cerebral white matter bilaterally, nonspecific. Ventricles and sulci are within normal limits for age.  Prior bilateral cataract extraction is noted. Minimal paranasal sinus mucosal thickening is noted. Mastoid air cells are clear. Absent distal right internal carotid artery flow void is more fully evaluated below.  MRA HEAD FINDINGS  Study is moderately motion degraded.  Visualized distal vertebral arteries are patent and codominant. Left PICA origin is patent. Right AICA is dominant and supplies the PICA. SCA origins are patent. Basilar artery is patent without stenosis. There is a patent right posterior communicating artery. PCAs are patent without evidence of significant stenosis.  The left internal carotid artery is patent from skullbase to carotid terminus without evidence of flow limiting stenosis and with evaluation for milder stenosis limited by motion artifact. No flow related enhancement is seen within the intracranial or visualized distal cervical portions of the right ICA. There is reconstitution of flow at the right carotid terminus likely via the posterior communicating artery.  Gross flow is seen in the right A1 segment, however evaluation is limited by motion artifact. There is likely a patent anterior communicating artery providing cross flow. Left A1 segment is also grossly patent but not well evaluated due to motion. Both M1 segments are patent without evidence of high-grade stenosis. Proximal M2 trunks are grossly patent bilaterally. There is a mildly decreased number of smaller, more distal right MCA branch vessels likely related to the chronic parietal infarct. The A2 segments are patent with evaluation for stenosis limited by artifact.  IMPRESSION: 1. No acute intracranial abnormality. 2. Chronic right parietal cortical infarct. 3. Moderately motion degraded MRA, limiting evaluation for stenosis. Right internal carotid artery is occluded more proximally  in the neck with reconstitution at the level of the carotid terminus.   Electronically Signed   By: Logan Bores   On: 12/23/2014 10:42   US Carotid Bilateral  12/22/2014   CLINICAL DATA:  Acute onset of blurred vision, dizziness and confusion. Initial encounter.  EXAM: BILATERAL CAROTID DUPLEX ULTRASOUND  TECHNIQUE: Pearline Cables scale imaging, color Doppler and duplex ultrasound were performed of bilateral carotid and vertebral arteries in the neck.  COMPARISON:  None.  FINDINGS: Criteria: Quantification of carotid stenosis is based on velocity parameters that correlate the residual internal carotid diameter with NASCET-based stenosis levels, using the diameter of the distal internal carotid lumen as the denominator for stenosis measurement.  The following velocity measurements were obtained:  RIGHT  ICA:  0 cm/sec / 0 cm/sec  CCA:  92 cm/sec  SYSTOLIC ICA/CCA RATIO:  0.0  DIASTOLIC ICA/CCA RATIO:  0.0  ECA:  131 cm/sec  LEFT  ICA:  131 cm/sec / 38 cm/sec  CCA:  96 cm/sec  SYSTOLIC ICA/CCA RATIO:  1.4  DIASTOLIC ICA/CCA RATIO:  1.6  ECA:  289 cm/sec  RIGHT CAROTID ARTERY: Partially calcified plaque is noted along the right carotid bulb, with mild plaque along the proximal right external carotid artery. There is complete occlusion of the right internal carotid artery.  RIGHT VERTEBRAL ARTERY:  Normal antegrade flow noted.  LEFT CAROTID ARTERY: Partially calcified plaque is noted along the left carotid bulb, proximal external carotid artery and proximal internal carotid artery. Increased velocities at the left external carotid artery suggest some degree of narrowing.  LEFT VERTEBRAL ARTERY:  Normal antegrade flow noted.  IMPRESSION: 1. Complete occlusion of the right internal carotid artery. 2. Partially calcified plaque at both carotid bulbs. Plaque along the  external carotid arteries is more prominent on the left, with proximal luminal narrowing at the left external carotid artery. 3. Normal antegrade flow noted within  both vertebral arteries.  These results were called by telephone at the time of interpretation on 12/22/2014 at 7:24 pm to Dr. Dustin Flock, who verbally acknowledged these results.   Electronically Signed   By: Garald Balding M.D.   On: 12/22/2014 19:25    EKG:   Orders placed or performed during the hospital encounter of 12/22/14  . ED EKG  . ED EKG  . EKG 12-Lead  . EKG 12-Lead    ASSESSMENT AND PLAN:   64 year old female with past medical history significant for hypertension, prior TIAs, known right carotid artery occlusion extracranial presents to the hospital secondary to dizziness and headache.  #1 TIA-patient likely had worsening of her infarcted area in the right parietal lobe as she is not taking any aspirin or Plavix at home. -Dizziness and speech change her so much better. -MRI of the head is negative for any acute infarcts but showing chronic right parietal lobe infarct. MRA showing Possible extracranial right carotid occlusion and proximal region which is also confirmed by ultrasound. -This is chronic and patient said that she was notified about 3 years ago. Vascular consult as outpatient at this time. -Patient will be restarted back on aspirin and a statin will be added as her cholesterol is elevated. -PT and OT consults are pending at this time. 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 Stool studies are sent. -According to daughter patient was taking Imodium as outpatient as well. -If stool studies are negative will restart Imodium. Already much better now.  #3 hypertension-started on lisinopril, added metoprolol.  #4 DVT prophylaxis-on Lovenox.   All the records are reviewed and case discussed with Care Management/Social Workerr. Management plans discussed with the patient, family and they are in agreement.  CODE STATUS: FULL CODE  TOTAL TIME TAKING CARE OF THIS PATIENT: 40 minutes.   POSSIBLE D/C IN 1 DAY, DEPENDING ON CLINICAL  CONDITION.   Gladstone Lighter M.D on 12/23/2014 at 12:37 PM  Between 7am to 6pm - Pager - [phone]  After 6pm go to [email or web address] - password [email or web address] [facility]  Office  [phone]  CC: Primary care physician; Loistine Chance, MD

## 2014-12-23 NOTE — Progress Notes (Signed)
OT Cancellation Note  Patient Details Name: Sydney Evans MRN: [id number] DOB: 1950-09-11   Cancelled Treatment:    Received order for OT evaluation and chart reviewed. Unable to see patient due to being in MRI.  Will attempt again later.   [place], OTR/L ascom [phone] 12/23/2014, 9:24 AM

## 2014-12-23 NOTE — Consult Note (Signed)
[facility] Vascular Consult Note  MRN : [id number]  Sameeha Rockefeller is a 64 y.o. (27-Dec-1950) female who presents with chief complaint of  Chief Complaint  Patient presents with  . Dizziness  . Headache  . Hypertension   History of Present Illness:  64 year old female with past medical history significant for hypertension, prior TIAs, known right carotid artery occlusion extracranial presents to the hospital secondary to dizziness and headache. MRI of the head is negative for any acute infarcts but showing chronic right parietal lobe infarct. MRA showing possible extracranial right carotid occlusion and proximal region which is also confirmed by ultrasound. On duplex, complete occlusion of the right internal carotid artery, partially calcified plaque at both carotid bulbs, plaque along the external carotid arteries is more prominent on the left, with proximal luminal narrowing at the left external carotid artery and normal antegrade flow noted within both vertebral arteries.  Vascular surgery consulted for recommendation.  Current Facility-Administered Medications  Medication Dose Route Frequency Provider Last Rate Last Dose  . acetaminophen (TYLENOL) tablet 650 mg  650 mg Oral Q6H PRN Gladstone Lighter, MD   650 mg at 12/23/14 1047  . atorvastatin (LIPITOR) tablet 40 mg  40 mg Oral q1800 Dustin Flock, MD   40 mg at 12/22/14 1832  . calcium-vitamin D (OSCAL WITH D) 500-200 MG-UNIT per tablet 1 tablet  1 tablet Oral BID Dustin Flock, MD   1 tablet at 12/23/14 1048  . citalopram (CELEXA) tablet 40 mg  40 mg Oral Daily Dustin Flock, MD   40 mg at 12/23/14 1049  . diclofenac (VOLTAREN) EC tablet 75 mg  75 mg Oral BID Dustin Flock, MD   75 mg at 12/23/14 1051  . enoxaparin (LOVENOX) injection 30 mg  30 mg Subcutaneous Q24H Dustin Flock, MD   30 mg at 12/22/14 2128  . famotidine (PEPCID) tablet 20 mg  20 mg Oral BID Dustin Flock, MD   20 mg at 12/23/14 1049   . hydrALAZINE (APRESOLINE) injection 10 mg  10 mg Intravenous Q6H PRN Dustin Flock, MD   10 mg at 12/22/14 1645  . ibuprofen (ADVIL,MOTRIN) tablet 800 mg  800 mg Oral TID PRN Dustin Flock, MD   800 mg at 12/22/14 1832  . lamoTRIgine (LAMICTAL) tablet 200 mg  200 mg Oral BID Dustin Flock, MD   200 mg at 12/23/14 1048  . lisinopril (PRINIVIL,ZESTRIL) tablet 20 mg  20 mg Oral Daily Dustin Flock, MD   20 mg at 12/23/14 1047  . loratadine (CLARITIN) tablet 10 mg  10 mg Oral Daily Dustin Flock, MD   10 mg at 12/23/14 1049  . metoprolol tartrate (LOPRESSOR) tablet 25 mg  25 mg Oral BID Gladstone Lighter, MD   25 mg at 12/23/14 1509  . pantoprazole (PROTONIX) EC tablet 40 mg  40 mg Oral Daily Dustin Flock, MD   40 mg at 12/23/14 1049  . senna-docusate (Senokot-S) tablet 1 tablet  1 tablet Oral QHS PRN Dustin Flock, MD       Current Outpatient Prescriptions  Medication Sig Dispense Refill  . calcium carbonate (OS-CAL) 600 MG TABS tablet Take 600 mg by mouth 2 (two) times daily.    . cetirizine (ZYRTEC) 10 MG tablet Take 10 mg by mouth daily as needed for allergies.    . citalopram (CELEXA) 40 MG tablet Take 40 mg by mouth daily.    . diclofenac (VOLTAREN) 75 MG EC tablet Take 75 mg by mouth 2 (two) times daily.    [name]  lamoTRIgine (LAMICTAL) 200 MG tablet Take 200 mg by mouth 2 (two) times daily.    [name] omeprazole (PRILOSEC) 40 MG capsule Take 40 mg by mouth daily.    . ranitidine (ZANTAC) 150 MG tablet Take 150 mg by mouth daily as needed for heartburn.    [name] aspirin EC 325 MG tablet Take 1 tablet (325 mg total) by mouth daily. 30 tablet 3  . atorvastatin (LIPITOR) 40 MG tablet Take 1 tablet (40 mg total) by mouth daily at 6 PM. 30 tablet 2  . lisinopril (PRINIVIL,ZESTRIL) 20 MG tablet Take 1 tablet (20 mg total) by mouth daily. 30 tablet 2  . metoprolol tartrate (LOPRESSOR) 25 MG tablet Take 1 tablet (25 mg total) by mouth 2 (two) times daily. 60 tablet 2    Past Medical History   Diagnosis Date  . Hypertension   . TIA (transient ischemic attack)   . Blood clot in vein   . Carotid arterial disease     Past Surgical History  Procedure Laterality Date  . Cholecystectomy    . Appendectomy    . Foot surgery    . Knee surgery    . Femoral fx      Social History Social History  Substance Use Topics  . Smoking status: Current Every Day Smoker -- 1.00 packs/day  . Smokeless tobacco: None  . Alcohol Use: No    Family History Family History  Problem Relation Age of Onset  . CAD Mother     Allergies  Allergen Reactions  . Percocet [Oxycodone-Acetaminophen] Itching and Rash     REVIEW OF SYSTEMS (Negative unless checked)  Constitutional: [] Weight loss  [] Fever  [] Chills Cardiac: [] Chest pain   [] Chest pressure   [] Palpitations   [] Shortness of breath when laying flat   [] Shortness of breath at rest   [] Shortness of breath with exertion. Vascular:  [] Pain in legs with walking   [] Pain in legs at rest   [] Pain in legs when laying flat   [] Claudication   [] Pain in feet when walking  [] Pain in feet at rest  [] Pain in feet when laying flat   [] History of DVT   [] Phlebitis   [] Swelling in legs   [] Varicose veins   [] Non-healing ulcers Pulmonary:   [] Uses home oxygen   [] Productive cough   [] Hemoptysis   [] Wheeze  [] COPD   [] Asthma Neurologic:  [x] Dizziness  [] Blackouts   [] Seizures   [] History of stroke   [x] History of TIA  [] Aphasia   [] Temporary blindness   [] Dysphagia   [] Weakness or numbness in arms   [] Weakness or numbness in legs Musculoskeletal:  [] Arthritis   [] Joint swelling   [] Joint pain   [] Low back pain Hematologic:  [] Easy bruising  [] Easy bleeding   [] Hypercoagulable state   [] Anemic  [] Hepatitis Gastrointestinal:  [] Blood in stool   [] Vomiting blood  [] Gastroesophageal reflux/heartburn   [] Difficulty swallowing. Genitourinary:  [] Chronic kidney disease   [] Difficult urination  [] Frequent urination  [] Burning with urination   [] Blood in  urine Skin:  [] Rashes   [] Ulcers   [] Wounds Psychological:  [] History of anxiety   []  History of major depression.  Physical Examination  Filed Vitals:   12/23/14 0527 12/23/14 0658 12/23/14 1046 12/23/14 1223  BP: 165/72 168/75 141/112 150/63  Pulse: 54 58 51 59  Temp: 98.2 F (36.8 C) 98.3 F (36.8 C) 98.2 F (36.8 C) 98 F (36.7 C)  TempSrc: Oral Oral Oral Oral  Resp: 18 18  19   Height:  Weight:      SpO2: 98% 100% 91% 96%   Body mass index is 29.07 kg/(m^2). Gen:  WD/WN, NAD Head: [place]/AT, No temporalis wasting. Prominent temp pulse not noted. No facial droop.  Ear/Nose/Throat: Hearing grossly intact, nares w/o erythema or drainage, oropharynx w/o Erythema/Exudate Eyes: PERRLA, EOMI.  Neck: Supple, no nuchal rigidity.  Left sided bruit or JVD.  Pulmonary:  Good air movement, clear to auscultation bilaterally.  Cardiac: RRR, normal S1, S2, no Murmurs, rubs or gallops. Vascular:  Vessel Right Left  Radial Palpable Palpable  Ulnar Palpable Palpable  Brachial Palpable Palpable  Carotid Palpable, without bruit Palpable, without bruit  Aorta Not palpable N/A  Femoral Palpable Palpable  Popliteal Palpable Palpable  PT Palpable Palpable  DP Palpable Palpable   Gastrointestinal: soft, non-tender/non-distended. No guarding/reflex. No masses, surgical incisions, or scars. Musculoskeletal: M/S 5/5 throughout.  Extremities without ischemic changes.  No deformity or atrophy. No edema. Neurologic: CN 2-12 intact. Pain and light touch intact in extremities.  Symmetrical.  Speech is fluent. Motor exam as listed above. Psychiatric: Judgment intact, Mood & affect appropriate for pt's clinical situation. Dermatologic: No rashes or ulcers noted.  No cellulitis or open wounds. Lymph : No Cervical, Axillary, or Inguinal lymphadenopathy.  CBC Lab Results  Component Value Date   WBC 7.0 12/22/2014   HGB 12.1 12/22/2014   HCT 36.9 12/22/2014   MCV 85.1 12/22/2014   PLT 350  12/22/2014   BMET    Component Value Date/Time   NA 137 12/22/2014 1357   K 3.6 12/22/2014 1357   CL 105 12/22/2014 1357   CO2 25 12/22/2014 1357   GLUCOSE 94 12/22/2014 1357   BUN 10 12/22/2014 1357   CREATININE 1.01* 12/22/2014 1357   CALCIUM 8.9 12/22/2014 1357   GFRNONAA 58* 12/22/2014 1357   GFRAA >60 12/22/2014 1357   Estimated Creatinine Clearance: 53.7 mL/min (by C-G formula based on Cr of 1.01).  COAG Lab Results  Component Value Date   INR 0.89 12/22/2014   Radiology Ct Head Wo Contrast  12/22/2014   CLINICAL DATA:  Dizziness and headache.  Code stroke.  Hypertension.  EXAM: CT HEAD WITHOUT CONTRAST  TECHNIQUE: Contiguous axial images were obtained from the base of the skull through the vertex without intravenous contrast.  COMPARISON:  None.  FINDINGS: 2.1 by 0.9 cm hypodensity in the right parietal vertex favoring old stroke.  Otherwise, the brainstem, cerebellum, cerebral peduncles, thalamus, basal ganglia, basilar cisterns, and ventricular system appear within normal limits. No intracranial hemorrhage, mass lesion, or acute CVA.  There is atherosclerotic calcification of the cavernous carotid arteries bilaterally.  IMPRESSION: 1. Remote appearing 2 cm right parietal vertex stroke. No acute intracranial findings are identified. If the These results were called by telephone at the time of interpretation on 12/22/2014 at 2:39 pm to Dr. Lavonia Drafts , who verbally acknowledged these results.   Electronically Signed   By: Van Clines M.D.   On: 12/22/2014 14:42   Mr Jodene Nam Head Wo Contrast  12/23/2014   CLINICAL DATA:  Headache with blurred vision. Hypertension. Balance problems. History is of TIA.  EXAM: MRI HEAD WITHOUT CONTRAST  MRA HEAD WITHOUT CONTRAST  TECHNIQUE: Multiplanar, multiecho pulse sequences of the brain and surrounding structures were obtained without intravenous contrast. Angiographic images of the head were obtained using MRA technique without contrast.   COMPARISON:  Head CT 12/22/2014  FINDINGS: MRI HEAD FINDINGS  Some sequences are mildly to moderately motion degraded, particularly the FLAIR and coronal  T2 images.  There is no evidence of acute infarct, intracranial hemorrhage, mass, midline shift, or extra-axial fluid collection. Ventricles and sulci are normal. A small, chronic right parietal cortical infarct is again noted. There are a few small foci of T2 hyperintensity in the cerebral white matter bilaterally, nonspecific. Ventricles and sulci are within normal limits for age.  Prior bilateral cataract extraction is noted. Minimal paranasal sinus mucosal thickening is noted. Mastoid air cells are clear. Absent distal right internal carotid artery flow void is more fully evaluated below.  MRA HEAD FINDINGS  Study is moderately motion degraded.  Visualized distal vertebral arteries are patent and codominant. Left PICA origin is patent. Right AICA is dominant and supplies the PICA. SCA origins are patent. Basilar artery is patent without stenosis. There is a patent right posterior communicating artery. PCAs are patent without evidence of significant stenosis.  The left internal carotid artery is patent from skullbase to carotid terminus without evidence of flow limiting stenosis and with evaluation for milder stenosis limited by motion artifact. No flow related enhancement is seen within the intracranial or visualized distal cervical portions of the right ICA. There is reconstitution of flow at the right carotid terminus likely via the posterior communicating artery.  Gross flow is seen in the right A1 segment, however evaluation is limited by motion artifact. There is likely a patent anterior communicating artery providing cross flow. Left A1 segment is also grossly patent but not well evaluated due to motion. Both M1 segments are patent without evidence of high-grade stenosis. Proximal M2 trunks are grossly patent bilaterally. There is a mildly decreased  number of smaller, more distal right MCA branch vessels likely related to the chronic parietal infarct. The A2 segments are patent with evaluation for stenosis limited by artifact.  IMPRESSION: 1. No acute intracranial abnormality. 2. Chronic right parietal cortical infarct. 3. Moderately motion degraded MRA, limiting evaluation for stenosis. Right internal carotid artery is occluded more proximally in the neck with reconstitution at the level of the carotid terminus.   Electronically Signed   By: Logan Bores   On: 12/23/2014 10:42   Mr Brain Wo Contrast  12/23/2014   CLINICAL DATA:  Headache with blurred vision. Hypertension. Balance problems. History is of TIA.  EXAM: MRI HEAD WITHOUT CONTRAST  MRA HEAD WITHOUT CONTRAST  TECHNIQUE: Multiplanar, multiecho pulse sequences of the brain and surrounding structures were obtained without intravenous contrast. Angiographic images of the head were obtained using MRA technique without contrast.  COMPARISON:  Head CT 12/22/2014  FINDINGS: MRI HEAD FINDINGS  Some sequences are mildly to moderately motion degraded, particularly the FLAIR and coronal T2 images.  There is no evidence of acute infarct, intracranial hemorrhage, mass, midline shift, or extra-axial fluid collection. Ventricles and sulci are normal. A small, chronic right parietal cortical infarct is again noted. There are a few small foci of T2 hyperintensity in the cerebral white matter bilaterally, nonspecific. Ventricles and sulci are within normal limits for age.  Prior bilateral cataract extraction is noted. Minimal paranasal sinus mucosal thickening is noted. Mastoid air cells are clear. Absent distal right internal carotid artery flow void is more fully evaluated below.  MRA HEAD FINDINGS  Study is moderately motion degraded.  Visualized distal vertebral arteries are patent and codominant. Left PICA origin is patent. Right AICA is dominant and supplies the PICA. SCA origins are patent. Basilar artery is  patent without stenosis. There is a patent right posterior communicating artery. PCAs are patent without evidence of significant  stenosis.  The left internal carotid artery is patent from skullbase to carotid terminus without evidence of flow limiting stenosis and with evaluation for milder stenosis limited by motion artifact. No flow related enhancement is seen within the intracranial or visualized distal cervical portions of the right ICA. There is reconstitution of flow at the right carotid terminus likely via the posterior communicating artery.  Gross flow is seen in the right A1 segment, however evaluation is limited by motion artifact. There is likely a patent anterior communicating artery providing cross flow. Left A1 segment is also grossly patent but not well evaluated due to motion. Both M1 segments are patent without evidence of high-grade stenosis. Proximal M2 trunks are grossly patent bilaterally. There is a mildly decreased number of smaller, more distal right MCA branch vessels likely related to the chronic parietal infarct. The A2 segments are patent with evaluation for stenosis limited by artifact.  IMPRESSION: 1. No acute intracranial abnormality. 2. Chronic right parietal cortical infarct. 3. Moderately motion degraded MRA, limiting evaluation for stenosis. Right internal carotid artery is occluded more proximally in the neck with reconstitution at the level of the carotid terminus.   Electronically Signed   By: Logan Bores   On: 12/23/2014 10:42   US Carotid Bilateral  12/22/2014   CLINICAL DATA:  Acute onset of blurred vision, dizziness and confusion. Initial encounter.  EXAM: BILATERAL CAROTID DUPLEX ULTRASOUND  TECHNIQUE: Pearline Cables scale imaging, color Doppler and duplex ultrasound were performed of bilateral carotid and vertebral arteries in the neck.  COMPARISON:  None.  FINDINGS: Criteria: Quantification of carotid stenosis is based on velocity parameters that correlate the residual internal  carotid diameter with NASCET-based stenosis levels, using the diameter of the distal internal carotid lumen as the denominator for stenosis measurement.  The following velocity measurements were obtained:  RIGHT  ICA:  0 cm/sec / 0 cm/sec  CCA:  92 cm/sec  SYSTOLIC ICA/CCA RATIO:  0.0  DIASTOLIC ICA/CCA RATIO:  0.0  ECA:  131 cm/sec  LEFT  ICA:  131 cm/sec / 38 cm/sec  CCA:  96 cm/sec  SYSTOLIC ICA/CCA RATIO:  1.4  DIASTOLIC ICA/CCA RATIO:  1.6  ECA:  289 cm/sec  RIGHT CAROTID ARTERY: Partially calcified plaque is noted along the right carotid bulb, with mild plaque along the proximal right external carotid artery. There is complete occlusion of the right internal carotid artery.  RIGHT VERTEBRAL ARTERY:  Normal antegrade flow noted.  LEFT CAROTID ARTERY: Partially calcified plaque is noted along the left carotid bulb, proximal external carotid artery and proximal internal carotid artery. Increased velocities at the left external carotid artery suggest some degree of narrowing.  LEFT VERTEBRAL ARTERY:  Normal antegrade flow noted.  IMPRESSION: 1. Complete occlusion of the right internal carotid artery. 2. Partially calcified plaque at both carotid bulbs. Plaque along the external carotid arteries is more prominent on the left, with proximal luminal narrowing at the left external carotid artery. 3. Normal antegrade flow noted within both vertebral arteries.  These results were called by telephone at the time of interpretation on 12/22/2014 at 7:24 pm to Dr. Dustin Flock, who verbally acknowledged these results.   Electronically Signed   By: Garald Balding M.D.   On: 12/22/2014 19:25   Assessment/Plan 1) Bilateral Carotid Artery Disease  Right ICA occlusion - stable. No indication for surgery at this time.  Left ICA stenosis about 50-60% Patient encouraged to follow up as an outpatient to have CTA to confirm above.  Patient encouraged  to follow up in office every six months to follow left sided stenosis.    2) TIA Patient likely had worsening of her infarcted area in the right parietal lobe as she is not taking any aspirin or Plavix at home. Dizziness and speech change her so much better. MRI of the head is negative for any acute infarcts but showing chronic right parietal lobe infarct. MRA showing Possible extracranial right carotid occlusion and proximal region which is also confirmed by ultrasound. Patient will be restarted back on aspirin and a statin was added as her cholesterol is elevated.  3) Hypertension Started on lisinopril and metoprolol  4) Tobacco Dependence  Patient encouraged to quit smoking.  Discussed over the counter agents as well as prescription - she refused.      Sela Hua, PA-C  12/23/2014 4:27 PM

## 2014-12-23 NOTE — Progress Notes (Signed)
Speech Therapy Note: received order, consulted NSG re: pt's status and met w/ pt. Pt denied any trouble swallowing or w/ her speech. She conversed w/ NSG and SLP(briefly); NSG denied any deficits in pt's conversation/communication w/ her. Pt is on a regular diet eating a late breakfast meal. Rec. Continue w/ current diet as ordered w/ general aspiration precautions; pt agreed. ST will be available if any further needs. No skilled assessment indicated at this time as pt appears at her baseline. NSG will reconsult ST if nec.

## 2014-12-23 NOTE — Evaluation (Signed)
Physical Therapy Evaluation Patient Details Name: Sydney Evans MRN: [id number] DOB: [birth year] Today's Date: 12/23/2014   History of Present Illness  Sydney Evans is a 64 y.o. female with a known history of multiple TIAs, hypertension, right carotid artery stenosis who brought into the emergency room with complaint of feeling dizziness since this morning. She woke up with this. She also was having weakness in the left upper and left lower extremity. She also has been feeling off balance on her feet. She was also having difficulty with her speech having trouble finding words. She came to the emergency room had a CT scan which showed old stroke. Patient is not happy about being admitted to the hospital. She otherwise denies any chest pain shortness of breath or palpitations. She also reports that she was told that she has severe right-sided carotid [place] and was told that she would only live 6 months. She lives with her daughter and her 3 children in a one story home.  Clinical Impression  Currently pt demonstrates impairments with L UE and L LE strength, balance, and limitations with functional mobility.  Prior to admission, pt was not using any AD but reports h/o falls and stopped using RW about a week ago because it "slowed her down".  Pt has h/o balance issues and was told to use a RW by PT.  Pt lives with her daughter and 3 grandchildren in 1 level home with level entry.  Currently pt is supervision with functional mobility with RW; pt verbalizing that she would use the RW again and try to slow down (pt demonstrating appropriate safety during session).  Pt would benefit from skilled PT to address above noted impairments and functional limitations.  Recommend pt discharge to home with HHPT (for balance and L sided strengthening), support of family, and use of RW when medically appropriate.     Follow Up Recommendations Home health PT    Equipment  Recommendations  None recommended by PT    Recommendations for Other Services       Precautions / Restrictions Precautions Precautions: Fall Restrictions Weight Bearing Restrictions: No      Mobility  Bed Mobility Overal bed mobility: Independent                Transfers Overall transfer level: Needs assistance Equipment used: Rolling walker (2 wheeled) Transfers: Sit to/from [employer] Transfers Sit to Stand: Supervision (with and without RW) Stand pivot transfers: Supervision (transfer to toilet with no AD)          Ambulation/Gait Ambulation/Gait assistance: Supervision Ambulation Distance (Feet): 150 Feet (15 feet x2 no AD to/from bathroom; 150 feet with RW in room) Assistive device: Rolling walker (2 wheeled) Gait Pattern/deviations:  (unsteady without AD; steady and WFL with RW)        Stairs            Wheelchair Mobility    Modified Rankin (Stroke Patients Only)       Balance Overall balance assessment: Needs assistance Sitting-balance support: No upper extremity supported;Feet supported Sitting balance-Leahy Scale: Good     Standing balance support: No upper extremity supported Standing balance-Leahy Scale: Good Standing balance comment: posterior loss of balance with WBOS and eyes closed, sternal nudge (pt reports h/o falling backwards)                             Pertinent Vitals/Pain Pain Assessment: No/denies pain  Vitals stable and WFL throughout treatment session.    Home Living Family/patient expects to be discharged to:: Private residence Living Arrangements: Children (pt's daughter and 3 grandchildren) Available Help at Discharge: Family Type of Home: House Home Access: Level entry     Home Layout: One level Home Equipment: Environmental consultant - 2 wheels;Wheelchair - manual Additional Comments: .    Prior Function Level of Independence: Independent with assistive device(s)         Comments: Pt stopped  using RW about a week ago (pt with h/o PT for balance and was told to use RW but pt stopped using recently).  Pt with fall in December and s/p surgery for R femur fx.     Hand Dominance   Dominant Hand: Right    Extremity/Trunk Assessment   Upper Extremity Assessment: Defer to OT evaluation           Lower Extremity Assessment: RLE deficits/detail;LLE deficits/detail RLE Deficits / Details: WFL LLE Deficits / Details: L hip flexion 4/5; L knee extension and flexion 4/5; L DF 4+/5     Communication   Communication: No difficulties  Cognition Arousal/Alertness: Awake/alert Behavior During Therapy: WFL for tasks assessed/performed Overall Cognitive Status: Within Functional Limits for tasks assessed                      General Comments   Nursing cleared pt for participation in physical therapy.  Pt agreeable to PT session.    Exercises        Assessment/Plan    PT Assessment Patient needs continued PT services  PT Diagnosis Abnormality of gait   PT Problem List Decreased strength;Decreased balance;Decreased mobility  PT Treatment Interventions DME instruction;Gait training;Functional mobility training;Therapeutic activities;Therapeutic exercise;Balance training;Patient/family education   PT Goals (Current goals can be found in the Care Plan section) Acute Rehab PT Goals Patient Stated Goal: to go home PT Goal Formulation: With patient Time For Goal Achievement: 01/06/15 Potential to Achieve Goals: Good    Frequency Min 2X/week   Barriers to discharge        Co-evaluation               End of Session Equipment Utilized During Treatment: Gait belt Activity Tolerance: Patient tolerated treatment well Patient left:  (sitting on edge of bed with OT present) Nurse Communication: Mobility status         Time: 1340-1355 PT Time Calculation (min) (ACUTE ONLY): 15 min   Charges:   PT Evaluation $Initial PT Evaluation Tier I: 1 Procedure      PT G CodesLeitha Bleak 12/28/14, 3:39 PM Leitha Bleak, [place]

## 2014-12-23 NOTE — Discharge Summary (Signed)
[facility] [place] NAME: Sydney Evans    MR#:  [id number]  DATE OF BIRTH:  12-24-1950  DATE OF ADMISSION:  12/22/2014 ADMITTING PHYSICIAN: Dustin Flock, MD  DATE OF DISCHARGE: 12/23/2014  PRIMARY CARE PHYSICIAN: Loistine Chance, MD    ADMISSION DIAGNOSIS:  CVA (cerebral infarction) [I63.9] Ischemic stroke [I63.50]  DISCHARGE DIAGNOSIS:  Principal Problem:   TIA (transient ischemic attack)   SECONDARY DIAGNOSIS:   Past Medical History  Diagnosis Date  . Hypertension   . TIA (transient ischemic attack)   . Blood clot in vein   . Carotid arterial disease     HOSPITAL COURSE:    64 year old female with past medical history significant for hypertension, prior TIAs, known right carotid artery occlusion extracranial presents to the hospital secondary to dizziness and headache.  #1 TIA-patient likely had worsening of her infarcted area in the right parietal lobe as she is not taking any aspirin or Plavix at home. -Dizziness and speech change her so much better. -MRI of the head is negative for any acute infarcts but showing chronic right parietal lobe infarct. MRA showing Possible extracranial right carotid occlusion and proximal region which is also confirmed by ultrasound. -This is chronic and patient said that she was notified about 3 years ago. Vascular consult as outpatient at this time. -Patient will be restarted back on aspirin and a statin will be added as her cholesterol is elevated. LDL of 154. -PT and OT consults are pending at this time. -Neurology follow-up recommended - ECHO with no cardiac source of clot.  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 Stool studies are sent. -According to daughter patient was taking Imodium as outpatient as well. -If stool studies are negative will restart Imodium. Already much better now.  #3 hypertension-started on lisinopril, added metoprolol.  #4 Right carotid stenosis- likely  complete occlusion based on carotid US, will need CT angio- as recommended by vascular- but patient says that is near total occlusion and not operable based on previous follow up at a different place. - Already has right parietal chronic infarct - outpatient vascular f/u - Patient has to be compliant with her meds   DISCHARGE CONDITIONS:   Stable  CONSULTS OBTAINED:  Treatment Team:  Dustin Flock, MD Katha Cabal, MD  DRUG ALLERGIES:   Allergies  Allergen Reactions  . Percocet [Oxycodone-Acetaminophen] Itching and Rash    DISCHARGE MEDICATIONS:   Current Discharge Medication List    START taking these medications   Details  aspirin EC 325 MG tablet Take 1 tablet (325 mg total) by mouth daily. Qty: 30 tablet, Refills: 3    atorvastatin (LIPITOR) 40 MG tablet Take 1 tablet (40 mg total) by mouth daily at 6 PM. Qty: 30 tablet, Refills: 2    metoprolol tartrate (LOPRESSOR) 25 MG tablet Take 1 tablet (25 mg total) by mouth 2 (two) times daily. Qty: 60 tablet, Refills: 2      CONTINUE these medications which have CHANGED   Details  lisinopril (PRINIVIL,ZESTRIL) 20 MG tablet Take 1 tablet (20 mg total) by mouth daily. Qty: 30 tablet, Refills: 2      CONTINUE these medications which have NOT CHANGED   Details  calcium carbonate (OS-CAL) 600 MG TABS tablet Take 600 mg by mouth 2 (two) times daily.    cetirizine (ZYRTEC) 10 MG tablet Take 10 mg by mouth daily as needed for allergies.    citalopram (CELEXA) 40 MG tablet Take 40 mg by  mouth daily.    diclofenac (VOLTAREN) 75 MG EC tablet Take 75 mg by mouth 2 (two) times daily.    lamoTRIgine (LAMICTAL) 200 MG tablet Take 200 mg by mouth 2 (two) times daily.    omeprazole (PRILOSEC) 40 MG capsule Take 40 mg by mouth daily.    ranitidine (ZANTAC) 150 MG tablet Take 150 mg by mouth daily as needed for heartburn.      STOP taking these medications     ibuprofen (ADVIL,MOTRIN) 800 MG tablet           DISCHARGE INSTRUCTIONS:   1. F/u with Vascular in 1 week for right carotid stenosis 2. Neurology f/u in 2 weeks 3. PCP f/u in 1-2 weeks  If you experience worsening of your admission symptoms, develop shortness of breath, life threatening emergency, suicidal or homicidal thoughts you must seek medical attention immediately by calling 911 or calling your MD immediately  if symptoms less severe.  You Must read complete instructions/literature along with all the possible adverse reactions/side effects for all the Medicines you take and that have been prescribed to you. Take any new Medicines after you have completely understood and accept all the possible adverse reactions/side effects.   Please note  You were cared for by a hospitalist during your hospital stay. If you have any questions about your discharge medications or the care you received while you were in the hospital after you are discharged, you can call the unit and asked to speak with the hospitalist on call if the hospitalist that took care of you is not available. Once you are discharged, your primary care physician will handle any further medical issues. Please note that NO REFILLS for any discharge medications will be authorized once you are discharged, as it is imperative that you return to your primary care physician (or establish a relationship with a primary care physician if you do not have one) for your aftercare needs so that they can reassess your need for medications and monitor your lab values.    Today   CHIEF COMPLAINT:   Chief Complaint  Patient presents with  . Dizziness  . Headache  . Hypertension    VITAL SIGNS:  Blood pressure 150/63, pulse 59, temperature 98 F (36.7 C), temperature source Oral, resp. rate 19, height 5\' 2"  (1.575 m), weight 72.122 kg (159 lb), SpO2 96 %.  I/O:   Intake/Output Summary (Last 24 hours) at 12/23/14 1311 Last data filed at 12/23/14 1200  Gross per 24 hour  Intake       0 ml  Output   1050 ml  Net  -1050 ml    PHYSICAL EXAMINATION:   Physical Exam  GENERAL: 64 y.o.-year-old patient lying in the bed with no acute distress.  EYES: Pupils equal, round, reactive to light and accommodation. No scleral icterus. Extraocular muscles intact.  HEENT: Head atraumatic, normocephalic. Oropharynx and nasopharynx clear.  NECK: Supple, no jugular venous distention. No thyroid enlargement, no tenderness.  LUNGS: Normal breath sounds bilaterally, no wheezing, rales,rhonchi or crepitation. No use of accessory muscles of respiration.  CARDIOVASCULAR: S1, S2 normal. No murmurs, rubs, or gallops.  ABDOMEN: Soft, nontender, nondistended. Bowel sounds present. No organomegaly or mass.  EXTREMITIES: No pedal edema, cyanosis, or clubbing.  NEUROLOGIC: Cranial nerves II through XII are intact. Muscle strength 5/5 in right sided extremities and 4/5 left upper and lower extremities. Sensation intact. Gait not checked.  PSYCHIATRIC: The patient is alert and oriented x 3.  SKIN: No obvious  rash, lesion, or ulcer.    DATA REVIEW:   CBC  Recent Labs Lab 12/22/14 1357  WBC 7.0  HGB 12.1  HCT 36.9  PLT 350    Chemistries   Recent Labs Lab 12/22/14 1357  NA 137  K 3.6  CL 105  CO2 25  GLUCOSE 94  BUN 10  CREATININE 1.01*  CALCIUM 8.9  AST 21  ALT 11*  ALKPHOS 119  BILITOT 0.2*    Cardiac Enzymes No results for input(s): TROPONINI in the last 168 hours.  Microbiology Results  No results found for this or any previous visit.  RADIOLOGY:  Ct Head Wo Contrast  12/22/2014   CLINICAL DATA:  Dizziness and headache.  Code stroke.  Hypertension.  EXAM: CT HEAD WITHOUT CONTRAST  TECHNIQUE: Contiguous axial images were obtained from the base of the skull through the vertex without intravenous contrast.  COMPARISON:  None.  FINDINGS: 2.1 by 0.9 cm hypodensity in the right parietal vertex favoring old stroke.  Otherwise, the brainstem, cerebellum,  cerebral peduncles, thalamus, basal ganglia, basilar cisterns, and ventricular system appear within normal limits. No intracranial hemorrhage, mass lesion, or acute CVA.  There is atherosclerotic calcification of the cavernous carotid arteries bilaterally.  IMPRESSION: 1. Remote appearing 2 cm right parietal vertex stroke. No acute intracranial findings are identified. If the These results were called by telephone at the time of interpretation on 12/22/2014 at 2:39 pm to Dr. Lavonia Drafts , who verbally acknowledged these results.   Electronically Signed   By: Van Clines M.D.   On: 12/22/2014 14:42   Mr Jodene Nam Head Wo Contrast  12/23/2014   CLINICAL DATA:  Headache with blurred vision. Hypertension. Balance problems. History is of TIA.  EXAM: MRI HEAD WITHOUT CONTRAST  MRA HEAD WITHOUT CONTRAST  TECHNIQUE: Multiplanar, multiecho pulse sequences of the brain and surrounding structures were obtained without intravenous contrast. Angiographic images of the head were obtained using MRA technique without contrast.  COMPARISON:  Head CT 12/22/2014  FINDINGS: MRI HEAD FINDINGS  Some sequences are mildly to moderately motion degraded, particularly the FLAIR and coronal T2 images.  There is no evidence of acute infarct, intracranial hemorrhage, mass, midline shift, or extra-axial fluid collection. Ventricles and sulci are normal. A small, chronic right parietal cortical infarct is again noted. There are a few small foci of T2 hyperintensity in the cerebral white matter bilaterally, nonspecific. Ventricles and sulci are within normal limits for age.  Prior bilateral cataract extraction is noted. Minimal paranasal sinus mucosal thickening is noted. Mastoid air cells are clear. Absent distal right internal carotid artery flow void is more fully evaluated below.  MRA HEAD FINDINGS  Study is moderately motion degraded.  Visualized distal vertebral arteries are patent and codominant. Left PICA origin is patent. Right AICA  is dominant and supplies the PICA. SCA origins are patent. Basilar artery is patent without stenosis. There is a patent right posterior communicating artery. PCAs are patent without evidence of significant stenosis.  The left internal carotid artery is patent from skullbase to carotid terminus without evidence of flow limiting stenosis and with evaluation for milder stenosis limited by motion artifact. No flow related enhancement is seen within the intracranial or visualized distal cervical portions of the right ICA. There is reconstitution of flow at the right carotid terminus likely via the posterior communicating artery.  Gross flow is seen in the right A1 segment, however evaluation is limited by motion artifact. There is likely a patent anterior communicating artery providing  cross flow. Left A1 segment is also grossly patent but not well evaluated due to motion. Both M1 segments are patent without evidence of high-grade stenosis. Proximal M2 trunks are grossly patent bilaterally. There is a mildly decreased number of smaller, more distal right MCA branch vessels likely related to the chronic parietal infarct. The A2 segments are patent with evaluation for stenosis limited by artifact.  IMPRESSION: 1. No acute intracranial abnormality. 2. Chronic right parietal cortical infarct. 3. Moderately motion degraded MRA, limiting evaluation for stenosis. Right internal carotid artery is occluded more proximally in the neck with reconstitution at the level of the carotid terminus.   Electronically Signed   By: Logan Bores   On: 12/23/2014 10:42   Mr Brain Wo Contrast  12/23/2014   CLINICAL DATA:  Headache with blurred vision. Hypertension. Balance problems. History is of TIA.  EXAM: MRI HEAD WITHOUT CONTRAST  MRA HEAD WITHOUT CONTRAST  TECHNIQUE: Multiplanar, multiecho pulse sequences of the brain and surrounding structures were obtained without intravenous contrast. Angiographic images of the head were obtained  using MRA technique without contrast.  COMPARISON:  Head CT 12/22/2014  FINDINGS: MRI HEAD FINDINGS  Some sequences are mildly to moderately motion degraded, particularly the FLAIR and coronal T2 images.  There is no evidence of acute infarct, intracranial hemorrhage, mass, midline shift, or extra-axial fluid collection. Ventricles and sulci are normal. A small, chronic right parietal cortical infarct is again noted. There are a few small foci of T2 hyperintensity in the cerebral white matter bilaterally, nonspecific. Ventricles and sulci are within normal limits for age.  Prior bilateral cataract extraction is noted. Minimal paranasal sinus mucosal thickening is noted. Mastoid air cells are clear. Absent distal right internal carotid artery flow void is more fully evaluated below.  MRA HEAD FINDINGS  Study is moderately motion degraded.  Visualized distal vertebral arteries are patent and codominant. Left PICA origin is patent. Right AICA is dominant and supplies the PICA. SCA origins are patent. Basilar artery is patent without stenosis. There is a patent right posterior communicating artery. PCAs are patent without evidence of significant stenosis.  The left internal carotid artery is patent from skullbase to carotid terminus without evidence of flow limiting stenosis and with evaluation for milder stenosis limited by motion artifact. No flow related enhancement is seen within the intracranial or visualized distal cervical portions of the right ICA. There is reconstitution of flow at the right carotid terminus likely via the posterior communicating artery.  Gross flow is seen in the right A1 segment, however evaluation is limited by motion artifact. There is likely a patent anterior communicating artery providing cross flow. Left A1 segment is also grossly patent but not well evaluated due to motion. Both M1 segments are patent without evidence of high-grade stenosis. Proximal M2 trunks are grossly patent  bilaterally. There is a mildly decreased number of smaller, more distal right MCA branch vessels likely related to the chronic parietal infarct. The A2 segments are patent with evaluation for stenosis limited by artifact.  IMPRESSION: 1. No acute intracranial abnormality. 2. Chronic right parietal cortical infarct. 3. Moderately motion degraded MRA, limiting evaluation for stenosis. Right internal carotid artery is occluded more proximally in the neck with reconstitution at the level of the carotid terminus.   Electronically Signed   By: Logan Bores   On: 12/23/2014 10:42   US Carotid Bilateral  12/22/2014   CLINICAL DATA:  Acute onset of blurred vision, dizziness and confusion. Initial encounter.  EXAM: BILATERAL  CAROTID DUPLEX ULTRASOUND  TECHNIQUE: Pearline Cables scale imaging, color Doppler and duplex ultrasound were performed of bilateral carotid and vertebral arteries in the neck.  COMPARISON:  None.  FINDINGS: Criteria: Quantification of carotid stenosis is based on velocity parameters that correlate the residual internal carotid diameter with NASCET-based stenosis levels, using the diameter of the distal internal carotid lumen as the denominator for stenosis measurement.  The following velocity measurements were obtained:  RIGHT  ICA:  0 cm/sec / 0 cm/sec  CCA:  92 cm/sec  SYSTOLIC ICA/CCA RATIO:  0.0  DIASTOLIC ICA/CCA RATIO:  0.0  ECA:  131 cm/sec  LEFT  ICA:  131 cm/sec / 38 cm/sec  CCA:  96 cm/sec  SYSTOLIC ICA/CCA RATIO:  1.4  DIASTOLIC ICA/CCA RATIO:  1.6  ECA:  289 cm/sec  RIGHT CAROTID ARTERY: Partially calcified plaque is noted along the right carotid bulb, with mild plaque along the proximal right external carotid artery. There is complete occlusion of the right internal carotid artery.  RIGHT VERTEBRAL ARTERY:  Normal antegrade flow noted.  LEFT CAROTID ARTERY: Partially calcified plaque is noted along the left carotid bulb, proximal external carotid artery and proximal internal carotid artery.  Increased velocities at the left external carotid artery suggest some degree of narrowing.  LEFT VERTEBRAL ARTERY:  Normal antegrade flow noted.  IMPRESSION: 1. Complete occlusion of the right internal carotid artery. 2. Partially calcified plaque at both carotid bulbs. Plaque along the external carotid arteries is more prominent on the left, with proximal luminal narrowing at the left external carotid artery. 3. Normal antegrade flow noted within both vertebral arteries.  These results were called by telephone at the time of interpretation on 12/22/2014 at 7:24 pm to Dr. Dustin Flock, who verbally acknowledged these results.   Electronically Signed   By: Garald Balding M.D.   On: 12/22/2014 19:25    EKG:   Orders placed or performed during the hospital encounter of 12/22/14  . ED EKG  . ED EKG  . EKG 12-Lead  . EKG 12-Lead      Management plans discussed with the patient, family and they are in agreement.  CODE STATUS:     Code Status Orders        Start     Ordered   12/22/14 1809  Full code   Continuous     12/22/14 1808    Advance Directive Documentation        Most Recent Value   Type of Advance Directive  Healthcare Power of Attorney, Living will   Pre-existing out of facility DNR order (yellow form or pink MOST form)     "MOST" Form in Place?        TOTAL TIME TAKING CARE OF THIS PATIENT: 41 minutes.    Gladstone Lighter M.D on 12/23/2014 at 1:11 PM  Between 7am to 6pm - Pager - [phone]  After 6pm go to [email or web address] - password [email or web address] [facility]  Office  [phone]  CC: Primary care physician; Loistine Chance, MD

## 2014-12-25 LAB — STOOL CULTURE: Culture: NOT DETECTED

## 2015-01-03 LAB — WBCS, STOOL: WBCs, Stool: NONE SEEN

## 2015-05-31 DIAGNOSIS — M7502 Adhesive capsulitis of left shoulder: Secondary | ICD-10-CM | POA: Insufficient documentation

## 2015-05-31 HISTORY — DX: Adhesive capsulitis of left shoulder: M75.02

## 2015-06-12 ENCOUNTER — Other Ambulatory Visit: Payer: Self-pay | Admitting: Family Medicine

## 2015-06-12 DIAGNOSIS — Z8781 Personal history of (healed) traumatic fracture: Secondary | ICD-10-CM

## 2015-06-24 ENCOUNTER — Encounter: Payer: Self-pay | Admitting: Emergency Medicine

## 2015-06-24 ENCOUNTER — Emergency Department: Payer: Medicare Other

## 2015-06-24 ENCOUNTER — Emergency Department
Admission: EM | Admit: 2015-06-24 | Discharge: 2015-06-24 | Disposition: A | Discharge status: Home / Self Care | Payer: Medicare Other | Attending: Emergency Medicine | Admitting: Emergency Medicine

## 2015-06-24 DIAGNOSIS — M545 Low back pain: Secondary | ICD-10-CM | POA: Diagnosis present

## 2015-06-24 DIAGNOSIS — Z79899 Other long term (current) drug therapy: Secondary | ICD-10-CM | POA: Diagnosis not present

## 2015-06-24 DIAGNOSIS — M5431 Sciatica, right side: Secondary | ICD-10-CM | POA: Diagnosis not present

## 2015-06-24 DIAGNOSIS — Z7982 Long term (current) use of aspirin: Secondary | ICD-10-CM | POA: Diagnosis not present

## 2015-06-24 DIAGNOSIS — Z791 Long term (current) use of non-steroidal anti-inflammatories (NSAID): Secondary | ICD-10-CM | POA: Insufficient documentation

## 2015-06-24 DIAGNOSIS — R52 Pain, unspecified: Secondary | ICD-10-CM

## 2015-06-24 DIAGNOSIS — I10 Essential (primary) hypertension: Secondary | ICD-10-CM | POA: Insufficient documentation

## 2015-06-24 DIAGNOSIS — M5432 Sciatica, left side: Secondary | ICD-10-CM | POA: Insufficient documentation

## 2015-06-24 DIAGNOSIS — F17210 Nicotine dependence, cigarettes, uncomplicated: Secondary | ICD-10-CM | POA: Insufficient documentation

## 2015-06-24 NOTE — Discharge Instructions (Signed)
Heat Therapy Heat therapy can help ease sore, stiff, injured, and tight muscles and joints. Heat relaxes your muscles, which may help ease your pain.  RISKS AND COMPLICATIONS If you have any of the following conditions, do not use heat therapy unless your health care provider has approved:  Poor circulation.  Healing wounds or scarred skin in the area being treated.  Diabetes, heart disease, or high blood pressure.  Not being able to feel (numbness) the area being treated.  Unusual swelling of the area being treated.  Active infections.  Blood clots.  Cancer.  Inability to communicate pain. This may include young children and people who have problems with their brain function (dementia).  Pregnancy. Heat therapy should only be used on old, pre-existing, or long-lasting (chronic) injuries. Do not use heat therapy on new injuries unless directed by your health care provider. HOW TO USE HEAT THERAPY There are several different kinds of heat therapy, including:  Moist heat pack.  Warm water bath.  Hot water bottle.  Electric heating pad.  Heated gel pack.  Heated wrap.  Electric heating pad. Use the heat therapy method suggested by your health care provider. Follow your health care provider's instructions on when and how to use heat therapy. GENERAL HEAT THERAPY RECOMMENDATIONS  Do not sleep while using heat therapy. Only use heat therapy while you are awake.  Your skin may turn pink while using heat therapy. Do not use heat therapy if your skin turns red.  Do not use heat therapy if you have new pain.  High heat or long exposure to heat can cause burns. Be careful when using heat therapy to avoid burning your skin.  Do not use heat therapy on areas of your skin that are already irritated, such as with a rash or sunburn. SEEK MEDICAL CARE IF:  You have blisters, redness, swelling, or numbness.  You have new pain.  Your pain is worse. MAKE SURE  YOU:  Understand these instructions.  Will watch your condition.  Will get help right away if you are not doing well or get worse.   This information is not intended to replace advice given to you by your health care provider. Make sure you discuss any questions you have with your health care provider.   Document Released: 07/15/2011 Document Revised: 05/13/2014 Document Reviewed: 06/15/2013 Elsevier Interactive Patient Education 2016 Else[employer]  Musculoskeletal Pain Musculoskeletal pain is muscle and boney aches and pains. These pains can occur in any part of the body. Your caregiver may treat you without knowing the cause of the pain. They may treat you if blood or urine tests, X-rays, and other tests were normal.  CAUSES There is often not a definite cause or reason for these pains. These pains may be caused by a type of germ (virus). The discomfort may also come from overuse. Overuse includes working out too hard when your body is not fit. Boney aches also come from weather changes. Bone is sensitive to atmospheric pressure changes. HOME CARE INSTRUCTIONS   Ask when your test results will be ready. Make sure you get your test results.  Only take over-the-counter or prescription medicines for pain, discomfort, or fever as directed by your caregiver. If you were given medications for your condition, do not drive, operate machinery or power tools, or sign legal documents for 24 hours. Do not drink alcohol. Do not take sleeping pills or other medications that may interfere with treatment.  Continue all activities unless the activities cause  more pain. When the pain lessens, slowly resume normal activities. Gradually increase the intensity and duration of the activities or exercise.  During periods of severe pain, bed rest may be helpful. Lay or sit in any position that is comfortable.  Putting ice on the injured area.  Put ice in a bag.  Place a towel between your skin and the  bag.  Leave the ice on for 15 to 20 minutes, 3 to 4 times a day.  Follow up with your caregiver for continued problems and no reason can be found for the pain. If the pain becomes worse or does not go away, it may be necessary to repeat tests or do additional testing. Your caregiver may need to look further for a possible cause. SEEK IMMEDIATE MEDICAL CARE IF:  You have pain that is getting worse and is not relieved by medications.  You develop chest pain that is associated with shortness or breath, sweating, feeling sick to your stomach (nauseous), or throw up (vomit).  Your pain becomes localized to the abdomen.  You develop any new symptoms that seem different or that concern you. MAKE SURE YOU:   Understand these instructions.  Will watch your condition.  Will get help right away if you are not doing well or get worse.   This information is not intended to replace advice given to you by your health care provider. Make sure you discuss any questions you have with your health care provider.   Document Released: 04/22/2005 Document Revised: 07/15/2011 Document Reviewed: 12/25/2012 Elsevier Interactive Patient Education 2016 Elsevier Inc.  Sciatica Sciatica is pain, weakness, numbness, or tingling along your sciatic nerve. The nerve starts in the lower back and runs down the back of each leg. Nerve damage or certain conditions pinch or put pressure on the sciatic nerve. This causes the pain, weakness, and other discomforts of sciatica. HOME CARE   Only take medicine as told by your doctor.  Apply ice to the affected area for 20 minutes. Do this 3-4 times a day for the first 48-72 hours. Then try heat in the same way.  Exercise, stretch, or do your usual activities if these do not make your pain worse.  Go to physical therapy as told by your doctor.  Keep all doctor visits as told.  Do not wear high heels or shoes that are not supportive.  Get a firm mattress if your  mattress is too soft to lessen pain and discomfort. GET HELP RIGHT AWAY IF:   You cannot control when you poop (bowel movement) or pee (urinate).  You have more weakness in your lower back, lower belly (pelvis), butt (buttocks), or legs.  You have redness or puffiness (swelling) of your back.  You have a burning feeling when you pee.  You have pain that gets worse when you lie down.  You have pain that wakes you from your sleep.  Your pain is worse than past pain.  Your pain lasts longer than 4 weeks.  You are suddenly losing weight without reason. MAKE SURE YOU:   Understand these instructions.  Will watch this condition.  Will get help right away if you are not doing well or get worse.   This information is not intended to replace advice given to you by your health care provider. Make sure you discuss any questions you have with your health care provider.   Document Released: 01/30/2008 Document Revised: 01/11/2015 Document Reviewed: 09/01/2011 Elsevier Interactive Patient Education [employer].

## 2015-06-24 NOTE — ED Provider Notes (Signed)
[facility] Emergency Department Provider Note  ____________________________________________  Time seen: Approximately 11:17 AM  I have reviewed the triage vital signs and the nursing notes.   HISTORY  Chief Complaint Back Pain    HPI Sydney Evans is a 65 y.o. female , NAD, presents emergency Department with care of lower back pain and stinging over the last 24 hours. States she fell from standing on a chair earlier in January. States she followed up with her primary care physician 2 weeks later and declined imaging as her pain was improving. Notes she has been able to ambulate with moderate pain since that time. Has some bruising and swelling to lower back that has resolved. Note the stinging earlier this morning and felt she needed to come in for imaging. At this time the pain and radiation of pain has resolved. She denies loss of bowel or bladder function nor any saddle paresthesias. No open wounds or skin sores about the lower back. No fever, chills, headache, neck pain. Does have a remote history of a fracture of the thoracic spine which was repaired. Also notes she's had a remote history of TIAs in which she has an appointment with her neurologist in early March 2017.   Past Medical History  Diagnosis Date  . Hypertension   . TIA (transient ischemic attack)   . Blood clot in vein   . Carotid arterial disease [facility])     Patient Active Problem List   Diagnosis Date Noted  . TIA (transient ischemic attack) 12/23/2014    Past Surgical History  Procedure Laterality Date  . Cholecystectomy    . Appendectomy    . Foot surgery    . Knee surgery    . Femoral fx      Current Outpatient Rx  Name  Route  Sig  Dispense  Refill  . aspirin EC 325 MG tablet   Oral   Take 1 tablet (325 mg total) by mouth daily.   30 tablet   3   . atorvastatin (LIPITOR) 40 MG tablet   Oral   Take 1 tablet (40 mg total) by mouth daily at 6 PM.   30 tablet   2   .  calcium carbonate (OS-CAL) 600 MG TABS tablet   Oral   Take 600 mg by mouth 2 (two) times daily.         . cetirizine (ZYRTEC) 10 MG tablet   Oral   Take 10 mg by mouth daily as needed for allergies.         . citalopram (CELEXA) 40 MG tablet   Oral   Take 40 mg by mouth daily.         . diclofenac (VOLTAREN) 75 MG EC tablet   Oral   Take 75 mg by mouth 2 (two) times daily.         [name] lamoTRIgine (LAMICTAL) 200 MG tablet   Oral   Take 200 mg by mouth 2 (two) times daily.         [name] lisinopril (PRINIVIL,ZESTRIL) 20 MG tablet   Oral   Take 1 tablet (20 mg total) by mouth daily.   30 tablet   2   . metoprolol tartrate (LOPRESSOR) 25 MG tablet   Oral   Take 1 tablet (25 mg total) by mouth 2 (two) times daily.   60 tablet   2   . omeprazole (PRILOSEC) 40 MG capsule   Oral   Take 40 mg by mouth daily.         [name]  ranitidine (ZANTAC) 150 MG tablet   Oral   Take 150 mg by mouth daily as needed for heartburn.           Allergies Percocet  Family History  Problem Relation Age of Onset  . CAD Mother     Social History Social History  Substance Use Topics  . Smoking status: Current Every Day Smoker -- 0.50 packs/day    Types: Cigarettes  . Smokeless tobacco: None  . Alcohol Use: No     Review of Systems  Constitutional: No fever/chills, fatigue.  Eyes: No visual changes.  Cardiovascular: No chest pain. Respiratory:  No shortness of breath. No wheezing.  Gastrointestinal: No abdominal pain.  No nausea, vomiting.  No diarrhea, constipation. Genitourinary: Negative for dysuria, hematuria. No urinary hesitancy, urgency or increased frequency. No incontinence.  Musculoskeletal: Positive for lower back pain with radiation to legs (now resolved).  Skin: Negative for rash, bruising, lacerations. Neurological: Negative for headaches, focal weakness or numbness. Stinging in lower back. 10-point ROS otherwise  negative.  ____________________________________________   PHYSICAL EXAM:  VITAL SIGNS: ED Triage Vitals  Enc Vitals Group     BP 06/24/15 1016 190/87 mmHg     Pulse Rate 06/24/15 1016 62     Resp 06/24/15 1016 18     Temp 06/24/15 1016 98 F (36.7 C)     Temp Source 06/24/15 1016 Oral     SpO2 06/24/15 1016 99 %     Weight 06/24/15 1016 165 lb (74.844 kg)     Height 06/24/15 1016 5\' 3"  (1.6 m)     Head Cir --      Peak Flow --      Pain Score 06/24/15 1017 5     Pain Loc --      Pain Edu? --      Excl. in [place]? --     Constitutional: Alert and oriented. Well appearing and in no acute distress. Eyes: Conjunctivae are normal. PERRL. EOMI without pain.  Head: Atraumatic. Neck: No cervical spine tenderness to palpation. Supple with full range of motion. Hematological/Lymphatic/Immunilogical: No cervical lymphadenopathy. Cardiovascular: Normal rate, regular rhythm. Normal S1 and S2.  Good peripheral circulation. Respiratory: Normal respiratory effort without tachypnea or retractions. Lungs CTAB. Musculoskeletal:  No pain or deformities noted to palpation about the thoracic and upper lumbar spine. Pain to palpation of the lower lumbar spine without any palpable step-offs or deformities.  No lower extremity edema.  No joint effusions. Neurologic:  Normal speech and language. No gross focal neurologic deficits are appreciated. Sensation grossly intact about the upper and lower extremities including tactile sensation to the lower back. CN III-XII grossly in tact.  Skin:  Skin is warm, dry and intact. No rash noted. Psychiatric: Mood and affect are normal. Speech and behavior are normal. Patient exhibits appropriate insight and judgement.   ____________________________________________   LABS  None  ____________________________________________  EKG  None ____________________________________________  RADIOLOGY I have personally viewed and evaluated these images (plain  radiographs) as part of my medical decision making, as well as reviewing the written report by the radiologist.  [place] Thoracic Spine 2 View  06/24/2015  CLINICAL DATA:  [place] mid [id number], had pain at that time in both legs (resolved) but now has burning mid back to tailbone. Surgery (kyphopolasty?) done in [place] 4-5 yrs ago. EXAM: THORACIC SPINE 2 VIEWS COMPARISON:  None. FINDINGS: Previous fractures from T7 through T10 have been stabilized with vertebroplasty. No other convincing fractures. No bone lesions. Bones  are demineralized. There are mild disc degenerative changes along the mid and lower thoracic spine. IMPRESSION: 1. No acute fracture or acute finding. 2. Old compression fractures treated with vertebroplasty. Electronically Signed   By: Lajean Manes M.D.   On: 06/24/2015 12:00   Dg Lumbar Spine Complete  06/24/2015  CLINICAL DATA:  Recent fall several weeks ago with persistent back and leg pain. EXAM: LUMBAR SPINE - COMPLETE 4+ VIEW COMPARISON:  06/24/2015 FINDINGS: Normal lumbar spine alignment. Preserved vertebral body heights. Minor endplate degenerative changes. No compression fracture, wedge-shaped deformity or focal kyphosis. Previous T9 and T10 vertebral augmentations. Mild diffuse facet arthropathy. No pars defects noted. Normal pedicles and SI joints. Benign vascular pelvic calcifications. Aortic atherosclerosis present. Prior cholecystectomy noted. Negative for bowel obstruction. IMPRESSION: Chronic degenerative changes as above. No acute osseous finding or compression fracture Aortoiliac atherosclerosis Electronically Signed   By: Jerilynn Mages.  Shick M.D.   On: 06/24/2015 12:01   Dg Sacrum/coccyx  06/24/2015  CLINICAL DATA:  65 year old female with burning sensation in her tailbone. Had a fall in mid January of this year. EXAM: SACRUM AND COCCYX - 2+ VIEW COMPARISON:  Concurrently obtained radiographs of the lumbar spine. FINDINGS: There is no evidence of fracture or other focal bone lesions.  Atherosclerotic calcifications noted in the abdominal aorta. IMPRESSION: Negative. Aortic atherosclerosis. Electronically Signed   By: Jacqulynn Cadet M.D.   On: 06/24/2015 12:02    ____________________________________________    PROCEDURES  Procedure(s) performed: None   Medications - No data to display   ____________________________________________   INITIAL IMPRESSION / ASSESSMENT AND PLAN / ED COURSE  Pertinent imaging results that were available during my care of the patient were reviewed by me and considered in my medical decision making (see chart for details).  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 Patient will be discharged home with instructions for home care as she notes no pain or radiation of pain at this time. She should keep her appointment with her neurologist as scheduled in March. Advise that the patient follow up with her primary care provider on Monday to discuss her emergency department visit.  Patient is given ED precautions to return to the ED for any worsening or new symptoms.   ____________________________________________  FINAL CLINICAL IMPRESSION(S) / ED DIAGNOSES  Final diagnoses:  Bilateral sciatica      NEW MEDICATIONS STARTED DURING THIS VISIT:  New Prescriptions   No medications on file         Braxton Feathers, PA-C 06/24/15 1331  Earleen Newport, MD 06/24/15 [phone]

## 2015-06-24 NOTE — ED Notes (Signed)
Pt left prior to receiving D/C instructions. Per [place], [place] the person who was with the patient told her that they were going to leave. Pt not in room at this time. Pt ambulatory to the lobby.

## 2015-06-24 NOTE — ED Notes (Signed)
Pt to ed with c/o back pain that radiates down bilat legs. Pt states the pain started after she fell out of a chair on Jan 25th.  Pt ambulates slowly, reports pain is "stinging" in her back this am.  Pt reports decreased strength in bilat upper legs since fall.

## 2015-06-24 NOTE — ED Notes (Signed)
Back pain, states had fall in January. States now has low back pain intermittently radiating to both legs.

## 2015-06-26 ENCOUNTER — Other Ambulatory Visit: Payer: Self-pay | Admitting: Family Medicine

## 2015-06-26 DIAGNOSIS — Z8781 Personal history of (healed) traumatic fracture: Secondary | ICD-10-CM

## 2015-07-07 ENCOUNTER — Other Ambulatory Visit: Payer: Medicare Other

## 2015-07-17 DIAGNOSIS — Z8673 Personal history of transient ischemic attack (TIA), and cerebral infarction without residual deficits: Secondary | ICD-10-CM | POA: Insufficient documentation

## 2015-08-24 ENCOUNTER — Other Ambulatory Visit: Payer: Self-pay | Admitting: Vascular Surgery

## 2015-08-24 DIAGNOSIS — I6523 Occlusion and stenosis of bilateral carotid arteries: Secondary | ICD-10-CM

## 2015-08-25 ENCOUNTER — Ambulatory Visit: Payer: Medicare Other

## 2015-08-31 ENCOUNTER — Ambulatory Visit
Admission: RE | Admit: 2015-08-31 | Discharge: 2015-08-31 | Disposition: A | Discharge status: Home / Self Care | Payer: Medicare Other | Source: Ambulatory Visit | Attending: Vascular Surgery | Admitting: Vascular Surgery

## 2015-08-31 DIAGNOSIS — I6523 Occlusion and stenosis of bilateral carotid arteries: Secondary | ICD-10-CM | POA: Diagnosis not present

## 2015-08-31 DIAGNOSIS — M899 Disorder of bone, unspecified: Secondary | ICD-10-CM | POA: Diagnosis not present

## 2015-08-31 DIAGNOSIS — Z859 Personal history of malignant neoplasm, unspecified: Secondary | ICD-10-CM | POA: Diagnosis not present

## 2015-08-31 HISTORY — DX: Malignant (primary) neoplasm, unspecified: C80.1

## 2015-08-31 LAB — POCT I-STAT CREATININE: Creatinine, Ser: 1 mg/dL (ref 0.44–1.00)

## 2015-08-31 MED ORDER — IOPAMIDOL (ISOVUE-370) INJECTION 76%
80.0000 mL | Freq: Once | INTRAVENOUS | Status: AC | PRN
Start: 2015-08-31 — End: 2015-08-31
  Administered 2015-08-31: 80 mL via INTRAVENOUS

## 2015-09-07 ENCOUNTER — Other Ambulatory Visit (HOSPITAL_COMMUNITY): Payer: Self-pay | Admitting: Neurosurgery

## 2015-09-07 DIAGNOSIS — M5136 Other intervertebral disc degeneration, lumbar region: Secondary | ICD-10-CM

## 2015-10-11 ENCOUNTER — Ambulatory Visit
Admission: RE | Admit: 2015-10-11 | Discharge: 2015-10-11 | Disposition: A | Discharge status: Home / Self Care | Payer: Medicare Other | Source: Ambulatory Visit | Attending: Neurosurgery | Admitting: Neurosurgery

## 2015-10-11 DIAGNOSIS — M5126 Other intervertebral disc displacement, lumbar region: Secondary | ICD-10-CM | POA: Diagnosis not present

## 2015-10-11 DIAGNOSIS — M4806 Spinal stenosis, lumbar region: Secondary | ICD-10-CM | POA: Insufficient documentation

## 2015-10-11 DIAGNOSIS — M5136 Other intervertebral disc degeneration, lumbar region: Secondary | ICD-10-CM

## 2016-05-08 ENCOUNTER — Ambulatory Visit: Payer: Medicare Other | Admitting: Physical Therapy

## 2016-05-15 ENCOUNTER — Encounter: Payer: Medicare Other | Admitting: Physical Therapy

## 2016-05-15 ENCOUNTER — Ambulatory Visit: Payer: Medicare Other | Admitting: Physical Therapy

## 2016-05-20 ENCOUNTER — Encounter: Payer: Medicare Other | Admitting: Physical Therapy

## 2016-05-20 DIAGNOSIS — M7582 Other shoulder lesions, left shoulder: Secondary | ICD-10-CM

## 2016-05-20 HISTORY — DX: Other shoulder lesions, left shoulder: M75.82

## 2016-05-22 ENCOUNTER — Encounter: Payer: Medicare Other | Admitting: Physical Therapy

## 2016-05-22 ENCOUNTER — Ambulatory Visit: Payer: Medicare Other | Admitting: Physical Therapy

## 2016-05-27 ENCOUNTER — Encounter: Payer: Medicare Other | Admitting: Physical Therapy

## 2016-05-27 ENCOUNTER — Other Ambulatory Visit: Payer: Self-pay | Admitting: Surgery

## 2016-05-27 DIAGNOSIS — M7582 Other shoulder lesions, left shoulder: Secondary | ICD-10-CM

## 2016-05-27 DIAGNOSIS — M19012 Primary osteoarthritis, left shoulder: Secondary | ICD-10-CM

## 2016-05-28 ENCOUNTER — Ambulatory Visit: Payer: Medicare Other | Admitting: Physical Therapy

## 2016-05-29 ENCOUNTER — Encounter: Payer: Medicare Other | Admitting: Physical Therapy

## 2016-05-30 ENCOUNTER — Encounter: Payer: Medicare Other | Admitting: Physical Therapy

## 2016-06-03 ENCOUNTER — Encounter: Payer: Medicare Other | Admitting: Physical Therapy

## 2016-06-04 ENCOUNTER — Ambulatory Visit
Admission: RE | Admit: 2016-06-04 | Discharge: 2016-06-04 | Disposition: A | Discharge status: Home / Self Care | Payer: Medicare Other | Source: Ambulatory Visit | Attending: Surgery | Admitting: Surgery

## 2016-06-04 DIAGNOSIS — M7582 Other shoulder lesions, left shoulder: Secondary | ICD-10-CM | POA: Diagnosis present

## 2016-06-04 DIAGNOSIS — M19012 Primary osteoarthritis, left shoulder: Secondary | ICD-10-CM | POA: Diagnosis not present

## 2016-06-05 ENCOUNTER — Ambulatory Visit: Payer: Medicare Other | Admitting: Physical Therapy

## 2016-06-05 ENCOUNTER — Encounter: Payer: Medicare Other | Admitting: Physical Therapy

## 2016-06-10 ENCOUNTER — Encounter: Payer: Medicare Other | Admitting: Physical Therapy

## 2016-06-12 ENCOUNTER — Encounter: Payer: Medicare Other | Admitting: Physical Therapy

## 2016-06-14 ENCOUNTER — Ambulatory Visit: Payer: Medicare Other | Attending: Family Medicine

## 2016-06-14 ENCOUNTER — Other Ambulatory Visit: Payer: Self-pay | Admitting: Family Medicine

## 2016-06-14 DIAGNOSIS — R262 Difficulty in walking, not elsewhere classified: Secondary | ICD-10-CM

## 2016-06-14 DIAGNOSIS — Z1231 Encounter for screening mammogram for malignant neoplasm of breast: Secondary | ICD-10-CM

## 2016-06-14 DIAGNOSIS — R260 Ataxic gait: Secondary | ICD-10-CM | POA: Diagnosis not present

## 2016-06-14 NOTE — Therapy (Signed)
[place] MAIN [facility] SERVICES [address] [place], [place], [address] Phone: [phone]   Fax:  [phone]  Physical Therapy Evaluation  Patient Details  Name: Sydney Evans MRN: [id number] Date of Birth: Mar 26, 1951 Referring Provider: Dr. Lethea Killings  Encounter Date: 06/14/2016      PT End of Session - 06/14/16 1115    Visit Number 1   Number of Visits 16   Date for PT Re-Evaluation 08/02/16   Authorization Type 1 / 10 G code   PT Start Time 0920   PT Stop Time 1000   PT Time Calculation (min) 40 min   Equipment Utilized During Treatment Gait belt   Activity Tolerance Patient tolerated treatment well   Behavior During Therapy [facility] for tasks assessed/performed      Past Medical History:  Diagnosis Date  . Blood clot in vein   . Cancer ([place])    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  . Carotid arterial disease ([place])   . Hypertension   . TIA (transient ischemic attack)     Past Surgical History:  Procedure Laterality Date  . APPENDECTOMY    . CHOLECYSTECTOMY    . femoral fx    . FOOT SURGERY    . KNEE SURGERY      There were no vitals filed for this visit.       Subjective Assessment - 06/14/16 0936    Subjective Patient reports difficulty with walking (ataxia), ascending/desending up the the stairs, transfering in and out of the shower, closing eyes in the shower while washing hair, standing for long periods of time, and walking long distances. Patient states she "walks like she's drunk" and experiences on average more than 1 fall per week. Patient reports she does not own a cane, grab bars, or tub bench; but does own a walker and rollator. Patient states she does not use her walker for ambulation and refuses to .Patient reports she has an operation planned on the L shoulder (replacement) on Feb 27th. Patient reports increased LBP after standing and walking for long periods of time. Patient reports her balance has not improved in the past few  months.     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 Patient reports lumbar stenosis and hx of LBP   Limitations Lifting;Standing   How long can you stand comfortably? 38min -- secondary to increased LBP   How long can you walk comfortably? .75 miles --secondary to increased LBP   Patient Stated Goals Patient would like to be safer when ambulating    Currently in Pain? No/denies            [facility] PT Assessment - 06/14/16 0930      Assessment   Medical Diagnosis Gait instability and balance difficulties   Referring Provider Dr. Lethea Killings   Onset Date/Surgical Date 05/06/09   Hand Dominance Right   Next MD Visit unknown   Prior Therapy yes     Precautions   Precautions Fall     Balance Screen   Has the patient fallen in the past 6 months Yes   How many times? 30   Has the patient had a decrease in activity level because of a fear of falling?  Yes   Is the patient reluctant to leave their home because of a fear of falling?  No     Home Environment   Living Environment Private residence   Living Arrangements Children   Available Help at Discharge  Family   Type of [place] to enter   Entrance Stairs-Number of Steps 1   Entrance Stairs-Rails Right   Home Layout One level   [place] - 2 wheels  rollator     Prior Function   Level of Independence Independent with basic ADLs   Vocation On disability   Vocation Requirements N/A   Leisure Watching granddaughter     Coordination   Finger Nose Finger Test Highly inaccurate with eyes closed, mildly inaccurate with eyes opened     ROM / Strength   AROM / PROM / Strength Strength     Strength   Strength Assessment Site Hip;Knee;Ankle   Right/Left Hip Right;Left   Right Hip Flexion 4+/5   Right Hip ABduction 4+/5   Right Hip ADduction 4+/5   Left Hip Flexion 4+/5   Left Hip ABduction 4+/5   Left Hip ADduction 4+/5   Right/Left Knee Right;Left   Right  Knee Flexion 4-/5   Right Knee Extension 5/5   Left Knee Flexion 4-/5   Left Knee Extension 5/5   Right/Left Ankle Left;Right   Right Ankle Dorsiflexion 4/5   Right Ankle Plantar Flexion 4/5   Left Ankle Dorsiflexion 4/5   Left Ankle Plantar Flexion 4/5     Ambulation/Gait   Ambulation/Gait Yes   Ambulation/Gait Assistance 4: Min guard   Ambulation Distance (Feet) 200 Feet   Assistive device Straight cane   Gait Pattern --  Wide BOS, highly ataxic gait with lateral deviations B   Stairs Yes   Stairs Assistance 4: Min guard   Stair Management Technique One rail Right;Forwards   Number of Stairs 8   Gait Comments Highly ataxic Gait pattern; improved with use of SPC with decreased lateral deviations in gait. Although decreased lateral deviations still continue     Balance   Balance Assessed Yes     Standardized Balance Assessment   Standardized Balance Assessment Dynamic Gait Index;PASS;Timed Up and Go Test;Five Times Sit to Stand;10 meter walk test   Five times sit to stand comments  8.5     Dynamic Gait Index   Level Surface Moderate Impairment   Change in Gait Speed Moderate Impairment   Gait with Horizontal Head Turns Moderate Impairment   Gait with Vertical Head Turns Moderate Impairment   Gait and Pivot Turn Moderate Impairment   Step Over Obstacle Moderate Impairment   Step Around Obstacles Moderate Impairment   Steps Mild Impairment   Total Score 9     Timed Up and Go Test   Normal TUG (seconds) 9   TUG Comments Required min A for balance throughout      Observation: Eye pursuit with finger follow: Horizontally: Unsmooth eye movements, mild lag; Vertically: Unsmooth eyes movements with mild lag VOR: Unable to perform in vertical direction; moderate eye movement with horizontal direction Rhomberg: feet apart Eyes open: >20sec; Feet together Eyes open: 7 secs  TREATMENT: Ambulation with quad & SPC- 4 x 47ft with instruction on reciprocal gait pattern      PT  Education - 06/14/16 1114    Education provided Yes   Education Details Educated to use walker with ambulation; educated on use of cane with reciprocal gait pattern (Patient reports inability to use cane at home secondary to financial resources)   Person(s) Educated Patient   Methods Explanation;Demonstration   Comprehension Verbalized understanding;Returned demonstration             PT Long Term  Goals - 07-01-2016 1210      PT LONG TERM GOAL #1   Title Patient will improve DGI to >19/24 to demonstrate singificant improvement in walking ability and decreased fall risk   Baseline DGI: 10/24   Time 8   Period Weeks   Status New     PT LONG TERM GOAL #2   Title Patient will improve feet together, eyes open balance to >30sec to demonstrate improved ability to balance in the shower.   Baseline 7 secs   Time 8   Period Weeks   Status New     PT LONG TERM GOAL #3   Title Patient will be able to ambulate forward 80ft without lateralgait deviations without use of AD to demonstrate significant improvement in gait and decreased fall risk.    Baseline Multiple deviations throuhgout gait cycle   Time 8   Period Weeks   Status New     PT LONG TERM GOAL #4   Title Patient will be independent with HEP to continue benefits of therapy after discharge   Baseline dependent with exercise performance and technique   Time 8   Period Weeks   Status New               Plan - 07-01-2016 1142    Clinical Impression Statement Pt is a 66 yo right hand dominant female presenting with hx of falling and balance/gait abnormalities. Patient demonstrates decreased static and dynamic balance as indicated by decreased DGI scores, decreased sharpened rhomberg scores, and difficulty ambulating demonstrating an ataxic gait pattern. Patient requires frequent education on use of AD at home and refuses to use a walker. Educated patient on use of SPC for balance which improved her gait pattern. However, her  lack of financial resources makes her unable to use a cane at home. Patient will benefit from further skilled therapy focused on improving gait pattern and dynamic/static balance to decrease risk of falling. Patient also reports planned surgery on Feb 27th for a shoulder replacement of her L shoulder; increased fall risk may inhibit recovery after surgery is performed.     Rehab Potential Fair   Clinical Impairments Affecting Rehab Potential (-) Highly ataxic gait (+) highly motivated   PT Frequency 2x / week   PT Duration 8 weeks   PT Treatment/Interventions Manual techniques;Balance training;Therapeutic exercise;Therapeutic activities;Functional mobility training;Gait training;Stair training;Neuromuscular re-education;Patient/family education;Energy conservation;ADLs/Self Care Home Management   PT Next Visit Plan Follow up with cane/walker use at home; static and dynamic balancing; transfering from supine on floor to standing   PT Home Exercise Plan Ambulating with cane/walker at home   Consulted and Agree with Plan of Care Patient      Patient will benefit from skilled therapeutic intervention in order to improve the following deficits and impairments:  Abnormal gait, Decreased balance, Decreased coordination, Decreased endurance, Difficulty walking, Decreased strength, Impaired UE functional use, Decreased mobility, Pain  Visit Diagnosis: Ataxic gait  Difficulty in walking, not elsewhere classified      G-Codes - July 01, 2016 1216    Functional Assessment Tool Used DGI, Rhomberg balance tests, clinical judgement   Functional Limitation Mobility: Walking and moving around   Mobility: Walking and Moving Around Current Status [phone]) At least 60 percent but less than 80 percent impaired, limited or restricted   Mobility: Walking and Moving Around Goal Status [phone]) At least 20 percent but less than 40 percent impaired, limited or restricted       Problem List Patient Active Problem List  Diagnosis Date Noted  . TIA (transient ischemic attack) 12/23/2014    Blythe Stanford, PT DPT 06/14/2016, 12:17 PM  [place] MAIN [facility] SERVICES [address] [place], [place], [address] Phone: [phone]   Fax:  [phone]  Name: Sydney Evans MRN: [id number] Date of Birth: 06/11/1950

## 2016-06-17 ENCOUNTER — Encounter: Payer: Medicare Other | Admitting: Physical Therapy

## 2016-06-19 ENCOUNTER — Encounter: Payer: Medicare Other | Admitting: Physical Therapy

## 2016-06-19 ENCOUNTER — Ambulatory Visit: Payer: Medicare Other | Admitting: Physical Therapy

## 2016-06-19 ENCOUNTER — Encounter: Payer: Self-pay | Admitting: Physical Therapy

## 2016-06-19 DIAGNOSIS — R262 Difficulty in walking, not elsewhere classified: Secondary | ICD-10-CM

## 2016-06-19 DIAGNOSIS — R260 Ataxic gait: Secondary | ICD-10-CM | POA: Diagnosis not present

## 2016-06-19 NOTE — Patient Instructions (Addendum)
Feet Together, Head Motion - Eyes Closed    Start with standing in the corner with back supported, feet together on a firm surface with arms by side.  Start with eyes open looking at a specific object/color, hold for 15 sec- focus on feeling stable and secure, no sway. Then close eyes for 15 sec, visualizing the object and focusing on feeling secure against the wall.  Repeat 3-5 times each.  Then step slightly forward, keeping feet close together. You can put a chair in front of you for safety if needed. Start with eyes open and focus on feeling secure and stable. Try not to sway or lean, focus on object in front. Hold for 15 sec. Then close eyes and try to keep balance for at least 5-10 sec. Again, try not to sway or fall into corner.  Repeat 3-5 times each.

## 2016-06-19 NOTE — Therapy (Signed)
[place] MAIN [facility] SERVICES [address] [place], [place], [address] Phone: [phone]   Fax:  [phone]  Physical Therapy Treatment  Patient Details  Name: Sydney Evans MRN: [id number] Date of Birth: 1950/07/21 Referring Provider: Dr. Lethea Killings  Encounter Date: 06/19/2016      PT End of Session - 06/19/16 0835    Visit Number 2   Number of Visits 16   Date for PT Re-Evaluation 08/02/16   Authorization Type 2/ 10 G code   PT Start Time 0826   PT Stop Time 0910   PT Time Calculation (min) 44 min   Equipment Utilized During Treatment Gait belt   Activity Tolerance Patient tolerated treatment well   Behavior During Therapy [facility] for tasks assessed/performed      Past Medical History:  Diagnosis Date  . Blood clot in vein   . Cancer ([place])    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  . Carotid arterial disease ([place])   . Hypertension   . TIA (transient ischemic attack)     Past Surgical History:  Procedure Laterality Date  . APPENDECTOMY    . CHOLECYSTECTOMY    . femoral fx    . FOOT SURGERY    . KNEE SURGERY      There were no vitals filed for this visit.      Subjective Assessment - 06/19/16 0832    Subjective Patient reports getting [facility] and has done some walking with it. 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 Patient reports lumbar stenosis and hx of LBP   Limitations Lifting;Standing   How long can you stand comfortably? 6min -- secondary to increased LBP   How long can you walk comfortably? .75 miles --secondary to increased LBP   Diagnostic tests MRI- shows old infarct in cortical parietal lobe on right side;    Patient Stated Goals Patient would like to be safer when ambulating    Currently in Pain? 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   Patient's BP: Sitting: 167/89 Standing: 146/118  PT assessed BP manually but had difficulty assessing due to BP cuff malfunctioning;  Patient reports being asymptomatic. She does report not taking BP meds this morning due to being here early.   Gait in hallway x200 feet with quad cane with CGA and mod Vcs to slow down gait speed for better use of quad cane. Patient demonstrates narrow base of support with occasional scissoring but this lessons with better use of quad cane. Patient able to follow direction with better use of cane during instruction.  Initiated HEP: Standing in corner with feet together: Supported: Eyes open/closed 15 sec hold x3 each with cues to improve gaze stabilization for better balance control; Unsupported with feet together, eyes open/closed 15 sec hold x3 each with close supervision and cues to improve gaze stabilization and upper trunk control for better static balance. Patient demonstrates  better balance with eyes closed following 2 repetitions with better stance control;  Standing in parallel bars: Heel/toe raises on 1/2 bolster (flat side up) x15 with CGA and rail assist with cues to avoid excessive trunk lean to isolate ankle ROM; Balance with feet apart, with UE lift x10 with min A for balance control and mod VCs to improve upper trunk control and strategies for better stance;  Patient required min VCs for balance stability, including to increase trunk control for less loss of balance with smaller base of support                        PT Education - 06/19/16 0834    Education provided Yes   Education Details balance  exercise, posture, positioning;    Person(s) Educated Patient   Methods Explanation;Verbal cues   Comprehension Verbalized understanding;Returned demonstration;Verbal cues required             PT Long Term Goals - 06/14/16 1210      PT LONG TERM GOAL #1   Title Patient will improve DGI to >19/24 to demonstrate singificant improvement in walking ability and decreased fall risk   Baseline DGI: 10/24   Time 8   Period Weeks   Status New     PT LONG TERM GOAL #2   Title Patient will improve feet together, eyes open balance to >30sec to demonstrate improved ability to balance in the shower.   Baseline 7 secs   Time 8   Period Weeks   Status New     PT LONG TERM GOAL #3   Title Patient will be able to ambulate forward 85ft without lateralgait deviations without use of AD to demonstrate significant improvement in gait and decreased fall risk.    Baseline Multiple deviations throuhgout gait cycle   Time 8   Period Weeks   Status New     PT LONG TERM GOAL #4   Title Patient will be independent with HEP to continue benefits of therapy after discharge   Baseline dependent with exercise performance and technique   Time 8   Period Weeks   Status New               Plan - 06/19/16 [id number]    Clinical Impression Statement Patient instructed in gait safety with quad cane. She reports getting a quad cane at home but admits that she has not been using it because of her grandkids. Educated patient on how to adjust cane to correct height using teachback method. Patient instructed in static balance exercise for home to improve prioception and balance. She required min VCs for correct positioning. She continues to demonstrate unsteadiness and would benefit from additional skilled PT intervention to improve balance and safety;    Rehab Potential Fair   Clinical Impairments Affecting Rehab Potential (-) Highly ataxic gait (+) highly motivated   PT Frequency 2x / week   PT Duration 8 weeks    PT Treatment/Interventions Manual techniques;Balance training;Therapeutic exercise;Therapeutic activities;Functional mobility training;Gait training;Stair training;Neuromuscular re-education;Patient/family education;Energy conservation;ADLs/Self Care Home Management   PT Next Visit Plan Follow up with cane/walker use at home; static and dynamic balancing; transfering from supine on floor to standing   PT Home Exercise Plan Ambulating with cane/walker at home   Consulted and Agree with Plan of Care Patient      Patient will benefit from skilled therapeutic intervention in order to improve the following deficits and impairments:  Abnormal gait, Decreased balance,  Decreased coordination, Decreased endurance, Difficulty walking, Decreased strength, Impaired UE functional use, Decreased mobility, Pain  Visit Diagnosis: Ataxic gait  Difficulty in walking, not elsewhere classified     Problem List Patient Active Problem List   Diagnosis Date Noted  . TIA (transient ischemic attack) 12/23/2014    Caydin Yeatts PT, DPT 06/19/2016, 12:39 PM  [place] MAIN [facility] SERVICES [place], [place], [address] Phone: [phone]   Fax:  [phone]  Name: Riot Streitmatter MRN: [id number] Date of Birth: May 08, 1950

## 2016-06-21 ENCOUNTER — Ambulatory Visit: Payer: Medicare Other

## 2016-06-21 VITALS — BP 172/72

## 2016-06-21 DIAGNOSIS — R260 Ataxic gait: Secondary | ICD-10-CM

## 2016-06-21 DIAGNOSIS — R262 Difficulty in walking, not elsewhere classified: Secondary | ICD-10-CM

## 2016-06-21 NOTE — Therapy (Signed)
[place] MAIN [facility] SERVICES [address] [place], [place], [address] Phone: [phone]   Fax:  [phone]  Physical Therapy Treatment  Patient Details  Name: Sydney Evans MRN: [id number] Date of Birth: [birth year] Referring Provider: Dr. Lethea Killings  Encounter Date: 06/21/2016      PT End of Session - 06/21/16 1005    Visit Number 3   Number of Visits 16   Date for PT Re-Evaluation 08/02/16   Authorization Type 3/ 10 G code   PT Start Time 0903   PT Stop Time 0930   PT Time Calculation (min) 27 min   Equipment Utilized During Treatment Gait belt   Activity Tolerance Patient tolerated treatment well   Behavior During Therapy [facility] for tasks assessed/performed      Past Medical History:  Diagnosis Date  . Blood clot in vein   . Cancer ([place])    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  . Carotid arterial disease ([place])   . Hypertension   . TIA (transient ischemic attack)     Past Surgical History:  Procedure Laterality Date  . APPENDECTOMY    . CHOLECYSTECTOMY    . femoral fx    . FOOT SURGERY    . KNEE SURGERY      Vitals:   06/21/16 0936  BP: (!) 172/72        Subjective Assessment - 06/21/16 0936    Subjective Patient reports no falls since the previous visit and states she's been using her quad cane at home; patient reports she feels safer cane. Patient reports she's in a lot of pain and did not take her blood measure this morning.    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 Patient reports lumbar stenosis and hx of LBP   Limitations Lifting;Standing   How long can you stand comfortably? 90min -- secondary to increased LBP   How long can you walk comfortably? .75 miles --secondary to increased LBP   Diagnostic tests MRI- shows old infarct in cortical parietal lobe on right side;    Patient Stated Goals Patient would like to be safer when ambulating    Currently in Pain? Yes   Pain Score 9    Pain Location  Shoulder   Pain Orientation Left   Pain Descriptors / Indicators Throbbing   Pain Type Chronic pain      TREATMENT Therapeutic Exercise Feet together balance on half foam (round half down) - 2 x 27min Feet together rocking forward/backward (round half down) - 2 x 20  Shortened tandem stance on ground with intermittent UE support - 2 x 1 min Airex pad feet together balance - 2 x 1 min balancing Airex pad feet apart balance - head turns: up/down, left/right, EC, EO - 2 x 10 for head turns; 45 sec for head turns *Patient performs exercises with less need for UE support and less postural sway after cueing to anteriorly shift weight when balancing      PT Education - 06/21/16 1004    Education provided Yes   Education Details Education on importance on taking perscribed medication   Person(s) Educated Patient   Methods Explanation;Demonstration   Comprehension Verbalized understanding;Returned demonstration             PT Long Term Goals - 06/14/16 1210      PT LONG TERM GOAL #1   Title Patient will improve DGI to >19/24 to demonstrate singificant improvement in walking ability and decreased fall risk  Baseline DGI: 10/24   Time 8   Period Weeks   Status New     PT LONG TERM GOAL #2   Title Patient will improve feet together, eyes open balance to >30sec to demonstrate improved ability to balance in the shower.   Baseline 7 secs   Time 8   Period Weeks   Status New     PT LONG TERM GOAL #3   Title Patient will be able to ambulate forward 56ft without lateralgait deviations without use of AD to demonstrate significant improvement in gait and decreased fall risk.    Baseline Multiple deviations throuhgout gait cycle   Time 8   Period Weeks   Status New     PT LONG TERM GOAL #4   Title Patient will be independent with HEP to continue benefits of therapy after discharge   Baseline dependent with exercise performance and technique   Time 8   Period Weeks   Status New                Plan - 06/21/16 1006    Clinical Impression Statement Focused on performing standing balance exercises and avoided muscular strengthening exercises secondary to increased BP. Checked blood pressure throughout session and patient's BP did not exceed original measurement of 172/72 (other BP's were 168/78 and 170/80). Educated patient to anteriorly shift weight when balancing which afterwards, patient required less UE support to maintain balance. Patient continues to demonstrate significant decrease in balance and will benefit from further skilled therapy to return to prior level of function.    Rehab Potential Fair   Clinical Impairments Affecting Rehab Potential (-) Highly ataxic gait (+) highly motivated   PT Frequency 2x / week   PT Duration 8 weeks   PT Treatment/Interventions Manual techniques;Balance training;Therapeutic exercise;Therapeutic activities;Functional mobility training;Gait training;Stair training;Neuromuscular re-education;Patient/family education;Energy conservation;ADLs/Self Care Home Management   PT Next Visit Plan Follow up with cane/walker use at home; static and dynamic balancing; transfering from supine on floor to standing   PT Home Exercise Plan Ambulating with cane/walker at home   Consulted and Agree with Plan of Care Patient      Patient will benefit from skilled therapeutic intervention in order to improve the following deficits and impairments:  Abnormal gait, Decreased balance, Decreased coordination, Decreased endurance, Difficulty walking, Decreased strength, Impaired UE functional use, Decreased mobility, Pain  Visit Diagnosis: Ataxic gait  Difficulty in walking, not elsewhere classified     Problem List Patient Active Problem List   Diagnosis Date Noted  . TIA (transient ischemic attack) 12/23/2014    Sydney Evans, PT DPT 06/21/2016, 10:30 AM  [place] MAIN [facility] SERVICES [place], [place], [address] Phone: [phone]   Fax:  [phone]  Name: Sydney Evans MRN: [id number] Date of Birth: [birth year]

## 2016-06-24 ENCOUNTER — Encounter: Payer: Medicare Other | Admitting: Physical Therapy

## 2016-06-25 ENCOUNTER — Ambulatory Visit: Payer: Medicare Other | Admitting: Physical Therapy

## 2016-06-26 ENCOUNTER — Encounter
Admission: RE | Admit: 2016-06-26 | Discharge: 2016-06-26 | Disposition: A | Discharge status: Home / Self Care | Payer: Medicare Other | Source: Ambulatory Visit | Attending: Surgery | Admitting: Surgery

## 2016-06-26 ENCOUNTER — Encounter: Payer: Medicare Other | Admitting: Physical Therapy

## 2016-06-26 DIAGNOSIS — Z8673 Personal history of transient ischemic attack (TIA), and cerebral infarction without residual deficits: Secondary | ICD-10-CM | POA: Insufficient documentation

## 2016-06-26 DIAGNOSIS — R9431 Abnormal electrocardiogram [ECG] [EKG]: Secondary | ICD-10-CM | POA: Insufficient documentation

## 2016-06-26 DIAGNOSIS — M19012 Primary osteoarthritis, left shoulder: Secondary | ICD-10-CM | POA: Insufficient documentation

## 2016-06-26 DIAGNOSIS — Z01818 Encounter for other preprocedural examination: Secondary | ICD-10-CM | POA: Diagnosis present

## 2016-06-26 DIAGNOSIS — I10 Essential (primary) hypertension: Secondary | ICD-10-CM | POA: Diagnosis not present

## 2016-06-26 DIAGNOSIS — I639 Cerebral infarction, unspecified: Secondary | ICD-10-CM | POA: Diagnosis not present

## 2016-06-26 DIAGNOSIS — R001 Bradycardia, unspecified: Secondary | ICD-10-CM | POA: Insufficient documentation

## 2016-06-26 HISTORY — DX: Cerebral infarction, unspecified: I63.9

## 2016-06-26 HISTORY — DX: Contracture, unspecified elbow: M24.529

## 2016-06-26 HISTORY — DX: Age-related osteoporosis without current pathological fracture: M81.0

## 2016-06-26 HISTORY — DX: Bipolar disorder, unspecified: F31.9

## 2016-06-26 HISTORY — DX: Gastro-esophageal reflux disease without esophagitis: K21.9

## 2016-06-26 HISTORY — DX: Nasal congestion: R09.81

## 2016-06-26 HISTORY — DX: Unspecified osteoarthritis, unspecified site: M19.90

## 2016-06-26 HISTORY — DX: Unspecified atherosclerosis: I70.90

## 2016-06-26 HISTORY — DX: Hyperlipidemia, unspecified: E78.5

## 2016-06-26 HISTORY — DX: Other abnormalities of gait and mobility: R26.89

## 2016-06-26 LAB — PROTIME-INR
INR: 0.91
Prothrombin Time: 12.2 seconds (ref 11.4–15.2)

## 2016-06-26 LAB — CBC
HEMATOCRIT: 32.7 % — AB (ref 35.0–47.0)
Hemoglobin: 10.6 g/dL — ABNORMAL LOW (ref 12.0–16.0)
MCH: 24.8 pg — AB (ref 26.0–34.0)
MCHC: 32.3 g/dL (ref 32.0–36.0)
MCV: 76.8 fL — AB (ref 80.0–100.0)
Platelets: 368 10*3/uL (ref 150–440)
RBC: 4.25 MIL/uL (ref 3.80–5.20)
RDW: 17.4 % — ABNORMAL HIGH (ref 11.5–14.5)
WBC: 4.8 10*3/uL (ref 3.6–11.0)

## 2016-06-26 LAB — BASIC METABOLIC PANEL
ANION GAP: 7 (ref 5–15)
BUN: 9 mg/dL (ref 6–20)
CO2: 26 mmol/L (ref 22–32)
Calcium: 9 mg/dL (ref 8.9–10.3)
Chloride: 103 mmol/L (ref 101–111)
Creatinine, Ser: 0.78 mg/dL (ref 0.44–1.00)
GFR calc Af Amer: >60 mL/min (ref >60)
GFR calc non Af Amer: >60 mL/min (ref >60)
GLUCOSE: 97 mg/dL (ref 65–99)
POTASSIUM: 4.1 mmol/L (ref 3.5–5.1)
Sodium: 136 mmol/L (ref 135–145)

## 2016-06-26 LAB — TYPE AND SCREEN
ABO/RH(D): O POS
ANTIBODY SCREEN: NEGATIVE

## 2016-06-26 LAB — SURGICAL PCR SCREEN
MRSA, PCR: NEGATIVE
STAPHYLOCOCCUS AUREUS: POSITIVE — AB

## 2016-06-26 NOTE — Patient Instructions (Signed)
  Your procedure is scheduled on: 07/02/16 Tues Report to Same Day Surgery 2nd floor medical mall [facility] Entrance-take elevator on left to 2nd floor.  Check in with surgery information desk.) To find out your arrival time please call [phone] between 1PM - 3PM on 07/01/16 Mon  Remember: Instructions that are not followed completely may result in serious medical risk, up to and including death, or upon the discretion of your surgeon and anesthesiologist your surgery may need to be rescheduled.    _x___ 1. Do not eat food or drink liquids after midnight. No gum chewing or hard candies.     __x__ 2. No Alcohol for 24 hours before or after surgery.   __x__3. No Smoking for 24 prior to surgery.   ____  4. Bring all medications with you on the day of surgery if instructed.    __x__ 5. Notify your doctor if there is any change in your medical condition     (cold, fever, infections).     Do not wear jewelry, make-up, hairpins, clips or nail polish.  Do not wear lotions, powders, or perfumes. You may wear deodorant.  Do not shave 48 hours prior to surgery. Men may shave face and neck.  Do not bring valuables to the hospital.    [facility] is not responsible for any belongings or valuables.               Contacts, dentures or bridgework may not be worn into surgery.  Leave your suitcase in the car. After surgery it may be brought to your room.  For patients admitted to the hospital, discharge time is determined by your treatment team.   Patients discharged the day of surgery will not be allowed to drive home.  You will need someone to drive you home and stay with you the night of your procedure.    Please read over the following fact sheets that you were given:   [facility] Preparing for Surgery and or MRSA Information   _x___ Take these medicines the morning of surgery with A SIP OF WATER:    1. lisinopril (PRINIVIL,ZESTRIL  2.omeprazole  (PRILOSEC  3.  4.  5.  6.  ____Fleets enema or Magnesium Citrate as directed.   _x___ Use CHG Soap or sage wipes as directed on instruction sheet   ____ Use inhalers on the day of surgery and bring to hospital day of surgery  ____ Stop metformin 2 days prior to surgery    ____ Take 1/2 of usual insulin dose the night before surgery and none on the morning of           surgery.   ____ Stop Aspirin, Coumadin, Pllavix ,Eliquis, Effient, or Pradaxa  x__ Stop Anti-inflammatories such as Advil, Aleve, Ibuprofen, Motrin, Naproxen,          Naprosyn, Goodies powders or aspirin products. Ok to take Tylenol. Stop  diclofenac , VOLTAREN  And ibuprofen ADVIL,MOTRIN today.   ____ Stop supplements until after surgery.    ____ Bring C-Pap to the hospital.

## 2016-06-26 NOTE — Pre-Procedure Instructions (Signed)
MEDICAL CLEARANCE/ CT ANGIO OF NECK FROM 08/21/15 , AS INSTRUCTED BY DR [id number] AND CALLED TO DR Alfonse Ras.SPOKE WITH GAIL. ALSO CALLED AND FAXED TO TIFFANY AT DR [facility]

## 2016-06-27 ENCOUNTER — Inpatient Hospital Stay: Admission: RE | Admit: 2016-06-27 | Payer: Medicare Other | Source: Ambulatory Visit

## 2016-06-27 ENCOUNTER — Ambulatory Visit: Payer: Medicare Other

## 2016-06-27 DIAGNOSIS — R262 Difficulty in walking, not elsewhere classified: Secondary | ICD-10-CM

## 2016-06-27 DIAGNOSIS — R260 Ataxic gait: Secondary | ICD-10-CM | POA: Diagnosis not present

## 2016-06-27 NOTE — Therapy (Signed)
[place] MAIN [facility] SERVICES [address] [place], [place], [address] Phone: [phone]   Fax:  [phone]  Physical Therapy Treatment  Patient Details  Name: Sydney Evans MRN: [id number] Date of Birth: 08/16/1950 Referring Provider: Dr. Lethea Killings  Encounter Date: 06/27/2016      PT End of Session - 06/27/16 1028    Visit Number 4   Number of Visits 16   Date for PT Re-Evaluation 08/02/16   Authorization Type 4/ 10 G code   PT Start Time 0945   PT Stop Time 1030   PT Time Calculation (min) 45 min   Equipment Utilized During Treatment Gait belt   Activity Tolerance Patient tolerated treatment well   Behavior During Therapy [facility] for tasks assessed/performed      Past Medical History:  Diagnosis Date  . Arthritis   . Bipolar disorder ([place])   . Blocked artery ([place])    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  . Blood clot in vein   . Cancer ([place])    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  . Carotid arterial disease ([place])   . Contracture of joint of upper arm   . Elevated lipids   . GERD (gastroesophageal reflux disease)   . Hypertension   . Osteoporosis   . Poor balance   . Sinus congestion   . Stroke ([place])    x 2  . TIA (transient ischemic attack)     Past Surgical History:  Procedure Laterality Date  . APPENDECTOMY    . BREAST BIOPSY Left   . CATARACT EXTRACTION W/ INTRAOCULAR LENS  IMPLANT, BILATERAL Bilateral   . CHOLECYSTECTOMY    . femoral fx    . FOOT SURGERY    . KNEE SURGERY    . TONSILLECTOMY    . TOTAL KNEE ARTHROPLASTY Bilateral     There were no vitals filed for this visit.      Subjective Assessment - 06/27/16 0947    Subjective Patient reports no falls since the previous visits and reports not sure if she can continue balance physical therapy after her shoulder surgery which is planned to be performed on 07/02/16.   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 Patient reports lumbar stenosis and hx of LBP    Limitations Lifting;Standing   How long can you stand comfortably? 45min -- secondary to increased LBP   How long can you walk comfortably? .75 miles --secondary to increased LBP   Diagnostic tests MRI- shows old infarct in cortical parietal lobe on right side;    Patient Stated Goals Patient would like to be safer when ambulating    Currently in Pain? Yes   Pain Score 9    Pain Location Shoulder   Pain Orientation Left   Pain Descriptors / Indicators Throbbing   Pain Type Chronic pain      TREATMENT Therapeutic Exercise Feet together balance on half foam (round half down) - 2 x 38min Feet together rocking forward/backward (round half down) - 2 x 20  Tandem stance on ground with intermittent UE support - 2 x 1 min Ambulation over grass with quad cane - x336ft Side stepping over half foam roller - x 20 B Forward stepping over half foam roller - x20 B       PT Education - 06/27/16 0953    Education provided Yes   Education Details Form/technique exercise througout session.    Person(s) Educated Patient   Methods Explanation;Demonstration   Comprehension Verbalized understanding;Returned  demonstration             PT Long Term Goals - 06/14/16 1210      PT LONG TERM GOAL #1   Title Patient will improve DGI to >19/24 to demonstrate singificant improvement in walking ability and decreased fall risk   Baseline DGI: 10/24   Time 8   Period Weeks   Status New     PT LONG TERM GOAL #2   Title Patient will improve feet together, eyes open balance to >30sec to demonstrate improved ability to balance in the shower.   Baseline 7 secs   Time 8   Period Weeks   Status New     PT LONG TERM GOAL #3   Title Patient will be able to ambulate forward 62ft without lateralgait deviations without use of AD to demonstrate significant improvement in gait and decreased fall risk.    Baseline Multiple deviations throuhgout gait cycle   Time 8   Period Weeks   Status New     PT LONG  TERM GOAL #4   Title Patient will be independent with HEP to continue benefits of therapy after discharge   Baseline dependent with exercise performance and technique   Time 8   Period Weeks   Status New               Plan - 06/27/16 1104    Clinical Impression Statement Patient demonstrates improved balance with exercises today requiring less support from PT to perform activities. Focused on performing ambulation over varied surfaces as patient reports she almost fell outside. Patient demonstrates supervisoin assist to perform indicating functional carryover between visits and pt will benefit from further skilled therapy to return to prior level of function.    Rehab Potential Fair   Clinical Impairments Affecting Rehab Potential (-) Highly ataxic gait (+) highly motivated   PT Frequency 2x / week   PT Duration 8 weeks   PT Treatment/Interventions Manual techniques;Balance training;Therapeutic exercise;Therapeutic activities;Functional mobility training;Gait training;Stair training;Neuromuscular re-education;Patient/family education;Energy conservation;ADLs/Self Care Home Management   PT Next Visit Plan Follow up with cane/walker use at home; static and dynamic balancing; transfering from supine on floor to standing   PT Home Exercise Plan Ambulating with cane/walker at home   Consulted and Agree with Plan of Care Patient      Patient will benefit from skilled therapeutic intervention in order to improve the following deficits and impairments:  Abnormal gait, Decreased balance, Decreased coordination, Decreased endurance, Difficulty walking, Decreased strength, Impaired UE functional use, Decreased mobility, Pain  Visit Diagnosis: Ataxic gait  Difficulty in walking, not elsewhere classified     Problem List Patient Active Problem List   Diagnosis Date Noted  . TIA (transient ischemic attack) 12/23/2014    Blythe Stanford, PT DPT 06/27/2016, 11:08 AM  [place] MAIN [facility] SERVICES [place], [place], [address] Phone: [phone]   Fax:  [phone]  Name: Sydney Evans MRN: [id number] Date of Birth: 31-Oct-1950

## 2016-06-28 ENCOUNTER — Inpatient Hospital Stay: Admission: RE | Admit: 2016-06-28 | Payer: Medicare Other | Source: Ambulatory Visit

## 2016-06-28 NOTE — Pre-Procedure Instructions (Signed)
Missed collection container when voided.  Was to return today by 2:15 pm.  No show. Came by yesterday but refused to stay to register so urine sample could not be processed.

## 2016-07-01 ENCOUNTER — Encounter: Payer: Medicare Other | Admitting: Physical Therapy

## 2016-07-01 NOTE — Pre-Procedure Instructions (Signed)
UNABLE TO REACH PCP OFFICE. SPOKE WITH TIFFANY AT DR [facility] OFFICE. PATIENT IS CNL UNTIL AFTER VASCULAR CLEARANCE

## 2016-07-02 ENCOUNTER — Ambulatory Visit: Payer: Medicare Other

## 2016-07-02 ENCOUNTER — Inpatient Hospital Stay: Admission: RE | Admit: 2016-07-02 | Payer: Medicare Other | Source: Ambulatory Visit | Admitting: Surgery

## 2016-07-02 ENCOUNTER — Encounter: Admission: RE | Payer: Self-pay | Source: Ambulatory Visit

## 2016-07-02 SURGERY — ARTHROPLASTY, SHOULDER, TOTAL
Anesthesia: Choice | Laterality: Left

## 2016-07-03 ENCOUNTER — Encounter: Payer: Medicare Other | Admitting: Physical Therapy

## 2016-07-04 ENCOUNTER — Ambulatory Visit: Payer: Medicare Other | Attending: Family Medicine | Admitting: Physical Therapy

## 2016-07-04 ENCOUNTER — Other Ambulatory Visit (INDEPENDENT_AMBULATORY_CARE_PROVIDER_SITE_OTHER): Payer: Medicare Other

## 2016-07-04 ENCOUNTER — Other Ambulatory Visit (INDEPENDENT_AMBULATORY_CARE_PROVIDER_SITE_OTHER): Payer: Self-pay | Admitting: Vascular Surgery

## 2016-07-04 DIAGNOSIS — I6521 Occlusion and stenosis of right carotid artery: Secondary | ICD-10-CM | POA: Diagnosis not present

## 2016-07-04 DIAGNOSIS — I6522 Occlusion and stenosis of left carotid artery: Secondary | ICD-10-CM

## 2016-07-04 LAB — VAS US CAROTID
LCCADDIAS: -32 cm/s
LCCAPDIAS: 36 cm/s
LEFT ECA DIAS: -35 cm/s
LICADDIAS: -31 cm/s
LICADSYS: -93 cm/s
Left CCA dist sys: -84 cm/s
Left CCA prox sys: 115 cm/s
Left ICA prox dias: 34 cm/s
Left ICA prox sys: 107 cm/s
RCCAPDIAS: 10 cm/s
RCCAPSYS: 120 cm/s
RIGHT CCA MID DIAS: 6 cm/s
RIGHT ECA DIAS: -18 cm/s

## 2016-07-10 ENCOUNTER — Encounter: Payer: Medicare Other | Admitting: Physical Therapy

## 2016-07-10 NOTE — Pre-Procedure Instructions (Signed)
CLEARED BY DR [facility] MOD RISK AND CLEARANCE INFO CALLED TO TIFFANY AT DR [facility]

## 2016-07-18 NOTE — Pre-Procedure Instructions (Signed)
CLEARED BY DR [facility] 06/06/16. SPOKE WITH DR Alfonse Ras OFFICE AND REQUESTED MEDICAL CLEARANCE NOTE AGAIN.

## 2016-07-22 NOTE — Pre-Procedure Instructions (Signed)
[place] Caryn Bee 07/19/16

## 2016-07-23 NOTE — Pre-Procedure Instructions (Signed)
SPOKE WITH TIFFANY AT DR [facility] OFFICE. PATIENT DOES NOT NEED ANOTHER PREOP AND UA/AC/ANY OTHER LABS CAN BE DONE DAY OF SURGERY

## 2016-07-25 ENCOUNTER — Inpatient Hospital Stay: Admission: RE | Admit: 2016-07-25 | Payer: Medicare Other | Source: Ambulatory Visit

## 2016-08-01 ENCOUNTER — Encounter: Payer: Self-pay | Admitting: *Deleted

## 2016-08-01 ENCOUNTER — Encounter
Admission: RE | Disposition: A | Discharge status: Home Health | Payer: Self-pay | Source: Ambulatory Visit | Attending: Surgery

## 2016-08-01 ENCOUNTER — Inpatient Hospital Stay: Payer: Medicare Other

## 2016-08-01 ENCOUNTER — Inpatient Hospital Stay: Payer: Medicare Other | Admitting: Anesthesiology

## 2016-08-01 ENCOUNTER — Inpatient Hospital Stay
Admission: RE | Admit: 2016-08-01 | Discharge: 2016-08-02 | DRG: 483 | Disposition: A | Discharge status: Home Health | Payer: Medicare Other | Source: Ambulatory Visit | Attending: Surgery | Admitting: Surgery

## 2016-08-01 DIAGNOSIS — I10 Essential (primary) hypertension: Secondary | ICD-10-CM | POA: Diagnosis present

## 2016-08-01 DIAGNOSIS — Z9049 Acquired absence of other specified parts of digestive tract: Secondary | ICD-10-CM | POA: Diagnosis not present

## 2016-08-01 DIAGNOSIS — Z96659 Presence of unspecified artificial knee joint: Secondary | ICD-10-CM | POA: Diagnosis present

## 2016-08-01 DIAGNOSIS — Z9849 Cataract extraction status, unspecified eye: Secondary | ICD-10-CM | POA: Diagnosis not present

## 2016-08-01 DIAGNOSIS — Z8673 Personal history of transient ischemic attack (TIA), and cerebral infarction without residual deficits: Secondary | ICD-10-CM

## 2016-08-01 DIAGNOSIS — R110 Nausea: Secondary | ICD-10-CM | POA: Diagnosis not present

## 2016-08-01 DIAGNOSIS — Z7982 Long term (current) use of aspirin: Secondary | ICD-10-CM | POA: Diagnosis not present

## 2016-08-01 DIAGNOSIS — M19012 Primary osteoarthritis, left shoulder: Secondary | ICD-10-CM | POA: Diagnosis present

## 2016-08-01 DIAGNOSIS — Z888 Allergy status to other drugs, medicaments and biological substances status: Secondary | ICD-10-CM | POA: Diagnosis not present

## 2016-08-01 DIAGNOSIS — Z90710 Acquired absence of both cervix and uterus: Secondary | ICD-10-CM | POA: Diagnosis not present

## 2016-08-01 DIAGNOSIS — Z8541 Personal history of malignant neoplasm of cervix uteri: Secondary | ICD-10-CM

## 2016-08-01 DIAGNOSIS — Z96612 Presence of left artificial shoulder joint: Secondary | ICD-10-CM

## 2016-08-01 DIAGNOSIS — Z885 Allergy status to narcotic agent status: Secondary | ICD-10-CM | POA: Diagnosis not present

## 2016-08-01 DIAGNOSIS — Z8261 Family history of arthritis: Secondary | ICD-10-CM

## 2016-08-01 DIAGNOSIS — M810 Age-related osteoporosis without current pathological fracture: Secondary | ICD-10-CM | POA: Diagnosis present

## 2016-08-01 DIAGNOSIS — Z791 Long term (current) use of non-steroidal anti-inflammatories (NSAID): Secondary | ICD-10-CM | POA: Diagnosis not present

## 2016-08-01 DIAGNOSIS — I6521 Occlusion and stenosis of right carotid artery: Secondary | ICD-10-CM | POA: Diagnosis present

## 2016-08-01 DIAGNOSIS — K219 Gastro-esophageal reflux disease without esophagitis: Secondary | ICD-10-CM | POA: Diagnosis present

## 2016-08-01 DIAGNOSIS — M7592 Shoulder lesion, unspecified, left shoulder: Secondary | ICD-10-CM | POA: Diagnosis present

## 2016-08-01 DIAGNOSIS — Z79899 Other long term (current) drug therapy: Secondary | ICD-10-CM

## 2016-08-01 DIAGNOSIS — F319 Bipolar disorder, unspecified: Secondary | ICD-10-CM | POA: Diagnosis present

## 2016-08-01 DIAGNOSIS — F17200 Nicotine dependence, unspecified, uncomplicated: Secondary | ICD-10-CM | POA: Diagnosis present

## 2016-08-01 DIAGNOSIS — I739 Peripheral vascular disease, unspecified: Secondary | ICD-10-CM | POA: Diagnosis present

## 2016-08-01 DIAGNOSIS — Z972 Presence of dental prosthetic device (complete) (partial): Secondary | ICD-10-CM | POA: Diagnosis not present

## 2016-08-01 HISTORY — PX: TOTAL SHOULDER ARTHROPLASTY: SHX126

## 2016-08-01 HISTORY — DX: Presence of left artificial shoulder joint: Z96.612

## 2016-08-01 LAB — CREATININE, SERUM
CREATININE: 1.05 mg/dL — AB (ref 0.44–1.00)
GFR calc Af Amer: >60 mL/min (ref >60)
GFR calc non Af Amer: 55 mL/min — ABNORMAL LOW (ref >60)

## 2016-08-01 LAB — URINALYSIS, ROUTINE W REFLEX MICROSCOPIC
Bilirubin Urine: NEGATIVE
GLUCOSE, UA: NEGATIVE mg/dL
Hgb urine dipstick: NEGATIVE
KETONES UR: NEGATIVE mg/dL
LEUKOCYTES UA: NEGATIVE
NITRITE: NEGATIVE
PH: 7 (ref 5.0–8.0)
Protein, ur: NEGATIVE mg/dL
Specific Gravity, Urine: 1.011 (ref 1.005–1.030)

## 2016-08-01 LAB — ABO/RH: ABO/RH(D): O POS

## 2016-08-01 SURGERY — ARTHROPLASTY, SHOULDER, TOTAL
Anesthesia: General | Site: Shoulder | Laterality: Left | Wound class: Clean

## 2016-08-01 MED ORDER — FENTANYL CITRATE (PF) 100 MCG/2ML IJ SOLN
25.0000 ug | INTRAMUSCULAR | Status: DC | PRN
  Administered 2016-08-01 (×4): 25 ug via INTRAVENOUS

## 2016-08-01 MED ORDER — DIPHENHYDRAMINE HCL 12.5 MG/5ML PO ELIX: 12.5000 mg | ORAL_SOLUTION | ORAL | Status: DC | PRN

## 2016-08-01 MED ORDER — ROPIVACAINE HCL 5 MG/ML IJ SOLN
INTRAMUSCULAR | Status: AC
  Filled 2016-08-01: qty 40

## 2016-08-01 MED ORDER — SUGAMMADEX SODIUM 200 MG/2ML IV SOLN
INTRAVENOUS | Status: DC | PRN
  Administered 2016-08-01: 200 mg via INTRAVENOUS

## 2016-08-01 MED ORDER — PHENYLEPHRINE HCL 10 MG/ML IJ SOLN
INTRAMUSCULAR | Status: AC
  Filled 2016-08-01: qty 1

## 2016-08-01 MED ORDER — LACTATED RINGERS IV SOLN
INTRAVENOUS | Status: DC
  Administered 2016-08-01 (×2): via INTRAVENOUS

## 2016-08-01 MED ORDER — CARBOXYMETHYLCELLULOSE SODIUM 0.5 % OP SOLN: 1.0000 [drp] | Freq: Every day | OPHTHALMIC | Status: DC | PRN

## 2016-08-01 MED ORDER — LIDOCAINE HCL (PF) 1 % IJ SOLN
INTRAMUSCULAR | Status: DC | PRN
  Administered 2016-08-01: 3 mL

## 2016-08-01 MED ORDER — MAGNESIUM OXIDE 400 (241.3 MG) MG PO TABS
400.0000 mg | ORAL_TABLET | Freq: Every day | ORAL | Status: DC
  Administered 2016-08-02: 400 mg via ORAL
  Filled 2016-08-01: qty 1

## 2016-08-01 MED ORDER — SODIUM CHLORIDE 0.9 % IV SOLN
INTRAVENOUS | Status: DC | PRN
  Administered 2016-08-01: 60 mL

## 2016-08-01 MED ORDER — HYDROCHLOROTHIAZIDE 12.5 MG PO CAPS
12.5000 mg | ORAL_CAPSULE | Freq: Every day | ORAL | Status: DC
  Administered 2016-08-02: 12.5 mg via ORAL
  Filled 2016-08-01: qty 1

## 2016-08-01 MED ORDER — ONDANSETRON HCL 4 MG/2ML IJ SOLN
4.0000 mg | Freq: Once | INTRAMUSCULAR | Status: DC | PRN
Start: 2016-08-01 — End: 2016-08-01

## 2016-08-01 MED ORDER — SODIUM CHLORIDE 0.9 % IV SOLN
INTRAVENOUS | Status: DC | PRN
  Administered 2016-08-01: 17 ug/kg/min via INTRAVENOUS

## 2016-08-01 MED ORDER — FENTANYL CITRATE (PF) 250 MCG/5ML IJ SOLN
INTRAMUSCULAR | Status: AC
  Filled 2016-08-01: qty 5

## 2016-08-01 MED ORDER — KETAMINE HCL 50 MG/ML IJ SOLN
INTRAMUSCULAR | Status: DC | PRN
  Administered 2016-08-01: 35 mg via INTRAVENOUS

## 2016-08-01 MED ORDER — SODIUM CHLORIDE 0.9 % IJ SOLN
INTRAMUSCULAR | Status: AC
  Filled 2016-08-01: qty 50

## 2016-08-01 MED ORDER — ROCURONIUM BROMIDE 100 MG/10ML IV SOLN
INTRAVENOUS | Status: DC | PRN
  Administered 2016-08-01: 20 mg via INTRAVENOUS
  Administered 2016-08-01: 50 mg via INTRAVENOUS
  Administered 2016-08-01: 10 mg via INTRAVENOUS

## 2016-08-01 MED ORDER — PHENYLEPHRINE HCL 10 MG/ML IJ SOLN
INTRAVENOUS | Status: DC | PRN
  Administered 2016-08-01: 30 ug/min via INTRAVENOUS

## 2016-08-01 MED ORDER — MIDAZOLAM HCL 2 MG/2ML IJ SOLN
INTRAMUSCULAR | Status: DC | PRN
  Administered 2016-08-01: 2 mg via INTRAVENOUS

## 2016-08-01 MED ORDER — ACETAMINOPHEN 325 MG PO TABS
650.0000 mg | ORAL_TABLET | Freq: Four times a day (QID) | ORAL | Status: DC | PRN
Start: 2016-08-01 — End: 2016-08-02

## 2016-08-01 MED ORDER — BISACODYL 10 MG RE SUPP
10.0000 mg | Freq: Every day | RECTAL | Status: DC | PRN
Start: 2016-08-01 — End: 2016-08-02

## 2016-08-01 MED ORDER — CEFAZOLIN SODIUM-DEXTROSE 2-4 GM/100ML-% IV SOLN
2.0000 g | Freq: Once | INTRAVENOUS | Status: AC
  Administered 2016-08-01: 2 g via INTRAVENOUS

## 2016-08-01 MED ORDER — FLEET ENEMA 7-19 GM/118ML RE ENEM: 1.0000 | ENEMA | Freq: Once | RECTAL | Status: DC | PRN

## 2016-08-01 MED ORDER — KCL IN DEXTROSE-NACL 20-5-0.9 MEQ/L-%-% IV SOLN
INTRAVENOUS | Status: DC
  Administered 2016-08-01 – 2016-08-02 (×2): via INTRAVENOUS
  Filled 2016-08-01 (×4): qty 1000

## 2016-08-01 MED ORDER — ONDANSETRON HCL 4 MG/2ML IJ SOLN: 4.0000 mg | Freq: Four times a day (QID) | INTRAMUSCULAR | Status: DC | PRN

## 2016-08-01 MED ORDER — POLYVINYL ALCOHOL 1.4 % OP SOLN: 1.0000 [drp] | OPHTHALMIC | Status: DC | PRN

## 2016-08-01 MED ORDER — CEFAZOLIN SODIUM-DEXTROSE 2-4 GM/100ML-% IV SOLN: 2.0000 g | Freq: Four times a day (QID) | INTRAVENOUS | Status: DC

## 2016-08-01 MED ORDER — ADULT MULTIVITAMIN W/MINERALS CH
1.0000 | ORAL_TABLET | Freq: Every day | ORAL | Status: DC
  Administered 2016-08-02: 1 via ORAL
  Filled 2016-08-01: qty 1

## 2016-08-01 MED ORDER — MAGNESIUM HYDROXIDE 400 MG/5ML PO SUSP
30.0000 mL | Freq: Every day | ORAL | Status: DC | PRN
Start: 2016-08-01 — End: 2016-08-02

## 2016-08-01 MED ORDER — PROPOFOL 10 MG/ML IV BOLUS
INTRAVENOUS | Status: DC | PRN
  Administered 2016-08-01: 180 mg via INTRAVENOUS
  Administered 2016-08-01: 20 mg via INTRAVENOUS

## 2016-08-01 MED ORDER — DEXAMETHASONE SODIUM PHOSPHATE 4 MG/ML IJ SOLN
INTRAMUSCULAR | Status: DC | PRN
  Administered 2016-08-01: 6 mg via INTRAVENOUS

## 2016-08-01 MED ORDER — FLUTICASONE PROPIONATE 50 MCG/ACT NA SUSP: 2.0000 | Freq: Every day | NASAL | Status: DC | PRN

## 2016-08-01 MED ORDER — ROPIVACAINE HCL 5 MG/ML IJ SOLN
INTRAMUSCULAR | Status: DC | PRN
  Administered 2016-08-01: 30 mL via EPIDURAL

## 2016-08-01 MED ORDER — ONDANSETRON HCL 4 MG PO TABS: 4.0000 mg | ORAL_TABLET | Freq: Four times a day (QID) | ORAL | Status: DC | PRN

## 2016-08-01 MED ORDER — VITAMIN D 1000 UNITS PO TABS
2000.0000 [IU] | ORAL_TABLET | Freq: Two times a day (BID) | ORAL | Status: DC
  Administered 2016-08-01 – 2016-08-02 (×2): 2000 [IU] via ORAL
  Filled 2016-08-01 (×2): qty 2

## 2016-08-01 MED ORDER — LISINOPRIL 20 MG PO TABS
40.0000 mg | ORAL_TABLET | Freq: Every day | ORAL | Status: DC
Start: 2016-08-02 — End: 2016-08-02
  Administered 2016-08-02: 40 mg via ORAL
  Filled 2016-08-01: qty 2

## 2016-08-01 MED ORDER — FLUCONAZOLE 50 MG PO TABS
150.0000 mg | ORAL_TABLET | Freq: Every day | ORAL | Status: DC
  Administered 2016-08-02: 150 mg via ORAL
  Filled 2016-08-01 (×2): qty 3

## 2016-08-01 MED ORDER — HYDROMORPHONE HCL 1 MG/ML IJ SOLN
INTRAMUSCULAR | Status: AC
  Filled 2016-08-01: qty 1

## 2016-08-01 MED ORDER — METOCLOPRAMIDE HCL 10 MG PO TABS: 5.0000 mg | ORAL_TABLET | Freq: Three times a day (TID) | ORAL | Status: DC | PRN

## 2016-08-01 MED ORDER — NEOMYCIN-POLYMYXIN B GU 40-200000 IR SOLN
Status: DC | PRN
  Administered 2016-08-01: 16 mL

## 2016-08-01 MED ORDER — HYDROMORPHONE HCL 2 MG PO TABS
2.0000 mg | ORAL_TABLET | ORAL | Status: DC | PRN
  Administered 2016-08-02 (×2): 2 mg via ORAL
  Filled 2016-08-01 (×2): qty 1

## 2016-08-01 MED ORDER — DOCUSATE SODIUM 100 MG PO CAPS
100.0000 mg | ORAL_CAPSULE | Freq: Two times a day (BID) | ORAL | Status: DC
Start: 2016-08-01 — End: 2016-08-02
  Administered 2016-08-01 – 2016-08-02 (×2): 100 mg via ORAL
  Filled 2016-08-01 (×2): qty 1

## 2016-08-01 MED ORDER — ROCURONIUM BROMIDE 50 MG/5ML IV SOLN
INTRAVENOUS | Status: AC
  Filled 2016-08-01: qty 1

## 2016-08-01 MED ORDER — BUPIVACAINE-EPINEPHRINE (PF) 0.5% -1:200000 IJ SOLN
INTRAMUSCULAR | Status: DC | PRN
  Administered 2016-08-01: 30 mL via PERINEURAL

## 2016-08-01 MED ORDER — SODIUM CHLORIDE 0.9 % IV SOLN
INTRAVENOUS | Status: DC | PRN
  Administered 2016-08-01: .2 mL via TOPICAL

## 2016-08-01 MED ORDER — CEFAZOLIN SODIUM-DEXTROSE 2-3 GM-% IV SOLR
2.0000 g | Freq: Four times a day (QID) | INTRAVENOUS | Status: AC
  Administered 2016-08-01 – 2016-08-02 (×3): 2 g via INTRAVENOUS
  Filled 2016-08-01 (×3): qty 50

## 2016-08-01 MED ORDER — PANTOPRAZOLE SODIUM 40 MG PO TBEC
40.0000 mg | DELAYED_RELEASE_TABLET | Freq: Two times a day (BID) | ORAL | Status: DC
  Administered 2016-08-01 – 2016-08-02 (×2): 40 mg via ORAL
  Filled 2016-08-01 (×2): qty 1

## 2016-08-01 MED ORDER — PROPOFOL 10 MG/ML IV BOLUS
INTRAVENOUS | Status: AC
  Filled 2016-08-01: qty 20

## 2016-08-01 MED ORDER — KETOROLAC TROMETHAMINE 15 MG/ML IJ SOLN: 30.0000 mg | Freq: Once | INTRAMUSCULAR | Status: DC

## 2016-08-01 MED ORDER — KETOROLAC TROMETHAMINE 15 MG/ML IJ SOLN: 15.0000 mg | Freq: Four times a day (QID) | INTRAMUSCULAR | Status: DC

## 2016-08-01 MED ORDER — METOCLOPRAMIDE HCL 5 MG/ML IJ SOLN: 5.0000 mg | Freq: Three times a day (TID) | INTRAMUSCULAR | Status: DC | PRN

## 2016-08-01 MED ORDER — MIDAZOLAM HCL 2 MG/2ML IJ SOLN
INTRAMUSCULAR | Status: AC
  Filled 2016-08-01: qty 2

## 2016-08-01 MED ORDER — ACETAMINOPHEN 10 MG/ML IV SOLN
INTRAVENOUS | Status: AC
  Filled 2016-08-01: qty 100

## 2016-08-01 MED ORDER — KETOROLAC TROMETHAMINE 30 MG/ML IJ SOLN
INTRAMUSCULAR | Status: AC
  Administered 2016-08-01: 30 mg via INTRAVENOUS
  Filled 2016-08-01: qty 1

## 2016-08-01 MED ORDER — LIDOCAINE HCL (PF) 1 % IJ SOLN
INTRAMUSCULAR | Status: AC
  Filled 2016-08-01: qty 5

## 2016-08-01 MED ORDER — HYDROMORPHONE HCL 1 MG/ML IJ SOLN: 1.0000 mg | INTRAMUSCULAR | Status: DC | PRN

## 2016-08-01 MED ORDER — CITALOPRAM HYDROBROMIDE 20 MG PO TABS
40.0000 mg | ORAL_TABLET | Freq: Every day | ORAL | Status: DC
Start: 2016-08-01 — End: 2016-08-02
  Administered 2016-08-02: 40 mg via ORAL
  Filled 2016-08-01: qty 2

## 2016-08-01 MED ORDER — CALCIUM-MAGNESIUM-ZINC 333-133-5 MG PO TABS
ORAL_TABLET | Freq: Two times a day (BID) | ORAL | Status: DC
Start: 2016-08-01 — End: 2016-08-01

## 2016-08-01 MED ORDER — NEOMYCIN-POLYMYXIN B GU 40-200000 IR SOLN
Status: AC
  Filled 2016-08-01: qty 20

## 2016-08-01 MED ORDER — FENTANYL CITRATE (PF) 100 MCG/2ML IJ SOLN
INTRAMUSCULAR | Status: AC
  Administered 2016-08-01: 25 ug via INTRAVENOUS
  Filled 2016-08-01: qty 2

## 2016-08-01 MED ORDER — FENTANYL CITRATE (PF) 100 MCG/2ML IJ SOLN
INTRAMUSCULAR | Status: DC | PRN
  Administered 2016-08-01 (×2): 50 ug via INTRAVENOUS
  Administered 2016-08-01: 100 ug via INTRAVENOUS
  Administered 2016-08-01: 50 ug via INTRAVENOUS

## 2016-08-01 MED ORDER — KETOROLAC TROMETHAMINE 30 MG/ML IJ SOLN
30.0000 mg | Freq: Once | INTRAMUSCULAR | Status: AC
  Administered 2016-08-01: 30 mg via INTRAVENOUS

## 2016-08-01 MED ORDER — TRANEXAMIC ACID 1000 MG/10ML IV SOLN
INTRAVENOUS | Status: AC
  Filled 2016-08-01: qty 10

## 2016-08-01 MED ORDER — ACETAMINOPHEN 500 MG PO TABS
1000.0000 mg | ORAL_TABLET | Freq: Four times a day (QID) | ORAL | Status: AC
  Administered 2016-08-01 – 2016-08-02 (×3): 1000 mg via ORAL
  Filled 2016-08-01 (×3): qty 2

## 2016-08-01 MED ORDER — TRANEXAMIC ACID 1000 MG/10ML IV SOLN
INTRAVENOUS | Status: DC | PRN
  Administered 2016-08-01: 1000 mg via INTRAVENOUS

## 2016-08-01 MED ORDER — HYDROMORPHONE HCL 1 MG/ML IJ SOLN
INTRAMUSCULAR | Status: DC | PRN
  Administered 2016-08-01: 2 mg via INTRAVENOUS
  Administered 2016-08-01: 1 mg via INTRAVENOUS

## 2016-08-01 MED ORDER — LATANOPROST 0.005 % OP SOLN
2.0000 [drp] | Freq: Every day | OPHTHALMIC | Status: DC
  Administered 2016-08-01: 2 [drp] via OPHTHALMIC
  Filled 2016-08-01: qty 2.5

## 2016-08-01 MED ORDER — SUGAMMADEX SODIUM 200 MG/2ML IV SOLN
INTRAVENOUS | Status: AC
  Filled 2016-08-01: qty 2

## 2016-08-01 MED ORDER — ACETAMINOPHEN 650 MG RE SUPP: 650.0000 mg | Freq: Four times a day (QID) | RECTAL | Status: DC | PRN

## 2016-08-01 MED ORDER — ATORVASTATIN CALCIUM 20 MG PO TABS: 40.0000 mg | ORAL_TABLET | Freq: Every day | ORAL | Status: DC

## 2016-08-01 MED ORDER — LIDOCAINE HCL (PF) 2 % IJ SOLN
INTRAMUSCULAR | Status: AC
  Filled 2016-08-01: qty 2

## 2016-08-01 MED ORDER — LAMOTRIGINE 100 MG PO TABS
200.0000 mg | ORAL_TABLET | Freq: Two times a day (BID) | ORAL | Status: DC
  Administered 2016-08-01 – 2016-08-02 (×2): 200 mg via ORAL
  Filled 2016-08-01 (×2): qty 2

## 2016-08-01 MED ORDER — HYDROMORPHONE HCL 1 MG/ML IJ SOLN
INTRAMUSCULAR | Status: AC
  Filled 2016-08-01: qty 2

## 2016-08-01 MED ORDER — LIDOCAINE HCL (CARDIAC) 20 MG/ML IV SOLN
INTRAVENOUS | Status: DC | PRN
  Administered 2016-08-01: 100 mg via INTRAVENOUS

## 2016-08-01 MED ORDER — DIPHENHYDRAMINE HCL 25 MG PO TABS: 25.0000 mg | ORAL_TABLET | Freq: Four times a day (QID) | ORAL | Status: DC | PRN

## 2016-08-01 MED ORDER — BUPIVACAINE-EPINEPHRINE (PF) 0.5% -1:200000 IJ SOLN
INTRAMUSCULAR | Status: AC
  Filled 2016-08-01: qty 30

## 2016-08-01 MED ORDER — ASPIRIN EC 325 MG PO TBEC
325.0000 mg | DELAYED_RELEASE_TABLET | Freq: Every day | ORAL | Status: DC
  Administered 2016-08-02: 325 mg via ORAL
  Filled 2016-08-01: qty 1

## 2016-08-01 MED ORDER — EPHEDRINE SULFATE 50 MG/ML IJ SOLN
INTRAMUSCULAR | Status: DC | PRN
  Administered 2016-08-01 (×2): 10 mg via INTRAVENOUS

## 2016-08-01 MED ORDER — CEFAZOLIN SODIUM-DEXTROSE 2-4 GM/100ML-% IV SOLN
INTRAVENOUS | Status: AC
  Filled 2016-08-01: qty 100

## 2016-08-01 MED ORDER — BUPIVACAINE LIPOSOME 1.3 % IJ SUSP
INTRAMUSCULAR | Status: AC
  Filled 2016-08-01: qty 20

## 2016-08-01 MED ORDER — ACETAMINOPHEN 10 MG/ML IV SOLN
INTRAVENOUS | Status: DC | PRN
  Administered 2016-08-01: 1000 mg via INTRAVENOUS

## 2016-08-01 MED ORDER — KETAMINE HCL 10 MG/ML IJ SOLN
INTRAMUSCULAR | Status: AC
  Filled 2016-08-01: qty 1

## 2016-08-01 MED ORDER — VITAMIN C 500 MG PO TABS
1000.0000 mg | ORAL_TABLET | Freq: Four times a day (QID) | ORAL | Status: DC
  Administered 2016-08-01 – 2016-08-02 (×2): 1000 mg via ORAL
  Filled 2016-08-01 (×2): qty 2

## 2016-08-01 MED ORDER — ENOXAPARIN SODIUM 40 MG/0.4ML ~~LOC~~ SOLN: 40.0000 mg | SUBCUTANEOUS | Status: DC

## 2016-08-01 MED ORDER — GLYCOPYRROLATE 0.2 MG/ML IJ SOLN
INTRAMUSCULAR | Status: DC | PRN
  Administered 2016-08-01: 0.2 mg via INTRAVENOUS

## 2016-08-01 MED ORDER — DEXAMETHASONE SODIUM PHOSPHATE 10 MG/ML IJ SOLN
INTRAMUSCULAR | Status: AC
  Filled 2016-08-01: qty 1

## 2016-08-01 SURGICAL SUPPLY — 61 items
BAG DECANTER FOR FLEXI CONT (MISCELLANEOUS) ×2 IMPLANT
BLADE SAGITTAL WIDE XTHICK NO (BLADE) ×2 IMPLANT
BONE CEMENT PALACOSE (Cement) ×2 IMPLANT
BOWL CEMENT MIX W/ADAPTER (MISCELLANEOUS) ×2 IMPLANT
CANISTER SUCT 1200ML W/VALVE (MISCELLANEOUS) ×2 IMPLANT
CANISTER SUCT 3000ML PPV (MISCELLANEOUS) ×4 IMPLANT
CAPT SHLDR TOTAL 2 ×2 IMPLANT
CATH TRAY METER 16FR LF (MISCELLANEOUS) ×2 IMPLANT
CEMENT BONE PALACOSE (Cement) ×1 IMPLANT
CHLORAPREP W/TINT 26ML (MISCELLANEOUS) ×4 IMPLANT
COOLER POLAR GLACIER W/PUMP (MISCELLANEOUS) ×2 IMPLANT
DRAPE IMP U-DRAPE 54X76 (DRAPES) ×4 IMPLANT
DRAPE INCISE IOBAN 66X45 STRL (DRAPES) ×4 IMPLANT
DRAPE SHEET LG 3/4 BI-LAMINATE (DRAPES) ×4 IMPLANT
DRAPE TABLE BACK 80X90 (DRAPES) ×2 IMPLANT
DRSG OPSITE POSTOP 4X8 (GAUZE/BANDAGES/DRESSINGS) ×2 IMPLANT
ELECT CAUTERY BLADE 6.4 (BLADE) ×2 IMPLANT
GAUZE PACK 2X3YD (MISCELLANEOUS) ×2 IMPLANT
GLOVE BIO SURGEON STRL SZ7.5 (GLOVE) ×4 IMPLANT
GLOVE BIO SURGEON STRL SZ8 (GLOVE) ×4 IMPLANT
GLOVE BIOGEL PI IND STRL 8 (GLOVE) ×1 IMPLANT
GLOVE BIOGEL PI INDICATOR 8 (GLOVE) ×1
GLOVE INDICATOR 8.0 STRL GRN (GLOVE) ×2 IMPLANT
GOWN STRL REUS W/ TWL LRG LVL3 (GOWN DISPOSABLE) ×2 IMPLANT
GOWN STRL REUS W/ TWL XL LVL3 (GOWN DISPOSABLE) ×1 IMPLANT
GOWN STRL REUS W/TWL LRG LVL3 (GOWN DISPOSABLE) ×2
GOWN STRL REUS W/TWL XL LVL3 (GOWN DISPOSABLE) ×1
HANDPIECE INTERPULSE COAX TIP (DISPOSABLE) ×1
HOOD PEEL AWAY FLYTE STAYCOOL (MISCELLANEOUS) ×6 IMPLANT
KIT STABILIZATION SHOULDER (MISCELLANEOUS) ×2 IMPLANT
MASK FACE SPIDER DISP (MASK) ×2 IMPLANT
NDL MAYO CATGUT SZ1 (NEEDLE)
NDL MAYO CATGUT SZ5 (NEEDLE)
NDL SUT 5 .5 CRC TPR PNT MAYO (NEEDLE) IMPLANT
NEEDLE 18GX1X1/2 (RX/OR ONLY) (NEEDLE) ×4 IMPLANT
NEEDLE MAYO CATGUT SZ1 (NEEDLE) IMPLANT
NEEDLE MAYO CATGUT SZ4 (NEEDLE) IMPLANT
NEEDLE SPNL 20GX3.5 QUINCKE YW (NEEDLE) ×2 IMPLANT
NS IRRIG 1000ML POUR BTL (IV SOLUTION) ×2 IMPLANT
PACK ARTHROSCOPY SHOULDER (MISCELLANEOUS) ×2 IMPLANT
PAD WRAPON POLAR SHDR UNIV (MISCELLANEOUS) ×1 IMPLANT
PIN HUMERAL STMN 3.2MMX9IN (INSTRUMENTS) ×2 IMPLANT
SET HNDPC FAN SPRY TIP SCT (DISPOSABLE) ×1 IMPLANT
SLING ULTRA II M (MISCELLANEOUS) ×2 IMPLANT
SOL .9 NS 3000ML IRR  AL (IV SOLUTION) ×1
SOL .9 NS 3000ML IRR UROMATIC (IV SOLUTION) ×1 IMPLANT
SPONGE LAP 18X18 5 PK (GAUZE/BANDAGES/DRESSINGS) IMPLANT
STAPLER SKIN PROX 35W (STAPLE) ×2 IMPLANT
STRAP SAFETY BODY (MISCELLANEOUS) ×2 IMPLANT
SUT ETHIBOND 0 MO6 C/R (SUTURE) ×2 IMPLANT
SUT FIBERWIRE #2 38 BLUE 1/2 (SUTURE) ×8
SUT TICRON 2-0 30IN 311381 (SUTURE) IMPLANT
SUT VIC AB 0 CT1 36 (SUTURE) IMPLANT
SUT VIC AB 2-0 CT1 27 (SUTURE) ×2
SUT VIC AB 2-0 CT1 TAPERPNT 27 (SUTURE) ×2 IMPLANT
SUTURE FIBERWR #2 38 BLUE 1/2 (SUTURE) ×4 IMPLANT
SYR 30ML LL (SYRINGE) ×4 IMPLANT
SYR TB 1ML LUER SLIP (SYRINGE) ×2 IMPLANT
SYRINGE 10CC LL (SYRINGE) ×2 IMPLANT
SYRINGE IRR TOOMEY STRL 70CC (SYRINGE) ×2 IMPLANT
WRAPON POLAR PAD SHDR UNIV (MISCELLANEOUS) ×2

## 2016-08-01 NOTE — H&P (Signed)
Paper H&P to be scanned into permanent record. H&P reviewed and patient re-examined. No changes. 

## 2016-08-01 NOTE — Anesthesia Procedure Notes (Signed)
Anesthesia Regional Block: Interscalene brachial plexus block   Pre-Anesthetic Checklist: ,, timeout performed, Correct Patient, Correct Site, Correct Laterality, Correct Procedure, Correct Position, site marked, Risks and benefits discussed,  Surgical consent,  Pre-op evaluation,  At surgeon's request and post-op pain management  Laterality: Left  Prep: chloraprep, alcohol swabs       Needles:  Injection technique: Single-shot  Needle Type: Stimiplex     Needle Length: 5cm  Needle Gauge: 22     Additional Needles:   Procedures: ultrasound guided, nerve stimulator,,,,,,   Nerve Stimulator or Paresthesia:  Response: biceps flexion, 0.5 mA,   Additional Responses:   Narrative:  Start time: 08/01/2016 11:20 AM End time: 08/01/2016 11:30 AM Injection made incrementally with aspirations every 5 mL.  Performed by: Personally   Additional Notes: Functioning IV was confirmed and monitors were applied.  A 71mm 22ga Stimuplex needle was used. Sterile prep and drape,hand hygiene and sterile gloves were used.  Negative aspiration and negative test dose prior to incremental administration of local anesthetic. The patient tolerated the procedure well.  Good spread around target nerves.  Easy administration of a total of 30 ml of 0.5% Ropivacaine plain after skin wheal with 1% Lidocaine plain.

## 2016-08-01 NOTE — Anesthesia Preprocedure Evaluation (Addendum)
Anesthesia Evaluation  Patient identified by MRN, date of birth, ID band Patient awake    Reviewed: Allergy & Precautions, NPO status , Patient's Chart, lab work & pertinent test results  Airway Mallampati: III  TM Distance: <3 FB     Dental  (+) Upper Dentures, Lower Dentures   Pulmonary Current Smoker,    Pulmonary exam normal        Cardiovascular hypertension, Pt. on medications + Peripheral Vascular Disease  Normal cardiovascular exam     Neuro/Psych PSYCHIATRIC DISORDERS Bipolar Disorder TIACVA    GI/Hepatic Neg liver ROS, GERD  ,  Endo/Other  negative endocrine ROS  Renal/GU negative Renal ROS  negative genitourinary   Musculoskeletal  (+) Arthritis , Osteoarthritis,    Abdominal Normal abdominal exam  (+)   Peds negative pediatric ROS (+)  Hematology negative hematology ROS (+)   Anesthesia Other Findings   Reproductive/Obstetrics                            Anesthesia Physical Anesthesia Plan  ASA: III  Anesthesia Plan: General   Post-op Pain Management:  Regional for Post-op pain   Induction: Intravenous  Airway Management Planned: Oral ETT  Additional Equipment:   Intra-op Plan:   Post-operative Plan: Extubation in OR  Informed Consent: I have reviewed the patients History and Physical, chart, labs and discussed the procedure including the risks, benefits and alternatives for the proposed anesthesia with the patient or authorized representative who has indicated his/her understanding and acceptance.     Plan Discussed with: Surgeon and CRNA  Anesthesia Plan Comments: (Discussed with the patient for possible need in postop period for interscalene block for pain control.  Patient understands risks and benefits and agrees to the block if needed.)       Anesthesia Quick Evaluation

## 2016-08-01 NOTE — Anesthesia Postprocedure Evaluation (Signed)
Anesthesia Post Note  Patient: Sydney Evans  Procedure(s) Performed: Procedure(s) (LRB): TOTAL SHOULDER ARTHROPLASTY (Left)  Patient location during evaluation: PACU Anesthesia Type: General Level of consciousness: awake and alert and oriented Pain management: pain level controlled Vital Signs Assessment: post-procedure vital signs reviewed and stable Respiratory status: spontaneous breathing Cardiovascular status: blood pressure returned to baseline Anesthetic complications: no Comments: We placed a L interscalene nerve block under US guidance in Post op for increased pain .  The patient tolerated the procedure well and reported good pain relief.     Last Vitals:  Vitals:   08/01/16 1611 08/01/16 1702  BP: 129/63 (!) 134/58  Pulse: (!) 56 (!) 53  Resp:    Temp: 36.4 C     Last Pain:  Vitals:   08/01/16 1611  TempSrc: Oral  PainSc:                  Daphne Karrer

## 2016-08-01 NOTE — Evaluation (Signed)
Physical Therapy Evaluation Patient Details Name: Sydney Evans MRN: [id number] DOB: 1951/04/12 Today's Date: 08/01/2016   History of Present Illness  Pt admitted for L proximal humerus replacement.   Clinical Impression  Pt is a pleasant 66 year old female who was admitted for L proximal humerus replacement. Pt performs bed mobility, transfers, and ambulation with cga and HHA. Recommend using SPC for further mobility. WIll need to practice stair training prior to dc home. Pt demonstrates deficits with mobility/strength/pain. Pt motivated to perform therapy. Would benefit from skilled PT to address above deficits and promote optimal return to PLOF. Recommend transition to [place] upon discharge from acute hospitalization.       Follow Up Recommendations Home health PT    Equipment Recommendations  None recommended by PT    Recommendations for Other Services       Precautions / Restrictions Precautions Precautions: Fall;Shoulder Shoulder Interventions: Shoulder abduction pillow;Shoulder sling/immobilizer Precaution Booklet Issued: No Restrictions Weight Bearing Restrictions: Yes LUE Weight Bearing: Non weight bearing      Mobility  Bed Mobility Overal bed mobility: Needs Assistance Bed Mobility: Supine to Sit     Supine to sit: Min guard     General bed mobility comments: Safe technique performed with pt able to pull on railing. Once seated at EOB, able to sit with supervision  Transfers Overall transfer level: Needs assistance Equipment used: 1 person hand held assist Transfers: Sit to/from Stand Sit to Stand: Min guard         General transfer comment: Safe technique with slight unsteadiness noted. Slight forward flexed posture  Ambulation/Gait Ambulation/Gait assistance: Min guard Ambulation Distance (Feet): 2 Feet Assistive device: 1 person hand held assist Gait Pattern/deviations: Step-to pattern     General Gait Details: ambulated to recliner with safe  technique with slight unsteadiness. Needs cues for sequencing. Will try [facility] for further ambulation  Stairs            Wheelchair Mobility    Modified Rankin (Stroke Patients Only)       Balance Overall balance assessment: Needs assistance Sitting-balance support: Feet supported Sitting balance-Leahy Scale: Normal     Standing balance support: Single extremity supported Standing balance-Leahy Scale: Good                               Pertinent Vitals/Pain Pain Assessment: Faces Faces Pain Scale: Hurts little more Pain Descriptors / Indicators: Aching;Operative site guarding Pain Intervention(s): Limited activity within patient's tolerance;Ice applied    Home Living Family/patient expects to be discharged to:: Private residence Living Arrangements: Children Available Help at Discharge: Family Type of Home: House Home Access: Stairs to enter Entrance Stairs-Rails: None Technical brewer of Steps: 1 Home Layout: One level Home Equipment: Cane - single point      Prior Function Level of Independence: Independent with assistive device(s)         Comments: was using SPC for mobility, reports she is fairly active     Hand Dominance        Extremity/Trunk Assessment   Upper Extremity Assessment Upper Extremity Assessment:  (L UE not tested; R UE grossly WFL)    Lower Extremity Assessment Lower Extremity Assessment: Overall WFL for tasks assessed       Communication   Communication: No difficulties  Cognition Arousal/Alertness: Awake/alert Behavior During Therapy: WFL for tasks assessed/performed Overall Cognitive Status: Within Functional Limits for tasks assessed  General Comments      Exercises     Assessment/Plan    PT Assessment Patient needs continued PT services  PT Problem List Decreased strength;Decreased activity tolerance;Decreased balance;Decreased  mobility;Pain       PT Treatment Interventions DME instruction;Gait training;Stair training;Therapeutic exercise    PT Goals (Current goals can be found in the Care Plan section)  Acute Rehab PT Goals Patient Stated Goal: to go home PT Goal Formulation: With patient Time For Goal Achievement: 08/15/16 Potential to Achieve Goals: Good    Frequency BID   Barriers to discharge        Co-evaluation               End of Session   Activity Tolerance: Patient tolerated treatment well Patient left: in chair;with chair alarm set;with family/visitor present;with SCD's reapplied Nurse Communication: Mobility status PT Visit Diagnosis: Unsteadiness on feet (R26.81);Muscle weakness (generalized) (M62.81);Difficulty in walking, not elsewhere classified (R26.2);Pain Pain - Right/Left: Left Pain - part of body: Shoulder    Time: 1530-1600 PT Time Calculation (min) (ACUTE ONLY): 30 min   Charges:   PT Evaluation $PT Eval Low Complexity: 1 Procedure     PT G Codes:        Sydney Evans, PT, DPT [phone]   Sydney Evans 08/01/2016, 5:07 PM

## 2016-08-01 NOTE — NC FL2 (Signed)
[place] LEVEL OF CARE SCREENING TOOL     IDENTIFICATION  Patient Name: Sydney Evans Birthdate: 12-22-1950 Sex: female Admission Date (Current Location): 08/01/2016  [facility] [place] Number:  Selena Lesser  ([id number]) Facility and Address:  [facility], [address] [place],  [address]      Provider Number: [id number]  Attending Physician Name and Address:  Corky Mull, MD  Relative Name and Phone Number:       Current Level of Care: Hospital Recommended Level of Care: [place] Prior Approval Number:    Date Approved/Denied:   PASRR Number:  [id number])  Discharge Plan: SNF    Current Diagnoses: Patient Active Problem List   Diagnosis Date Noted  . 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 TIA (transient ischemic attack) 12/23/2014    Orientation RESPIRATION BLADDER Height & Weight     Self, Time, Situation, Place  O2 (Nasal Cannula 2L/min) Incontinent, Indwelling catheter Weight: 171 lb (77.6 kg) Height:  5\' 3"  (160 cm)  BEHAVIORAL SYMPTOMS/MOOD NEUROLOGICAL BOWEL NUTRITION STATUS   (None. )  (None. ) Incontinent Diet (Diet: Clear Liquid)  AMBULATORY STATUS COMMUNICATION OF NEEDS Skin   Extensive Assist Verbally Surgical wounds (Incision: Left Shoulder)                       Personal Care Assistance Level of Assistance  Bathing, Feeding, Dressing Bathing Assistance: Limited assistance Feeding assistance: Independent Dressing Assistance: Limited assistance     Functional Limitations Info  Sight, Hearing, Speech Sight Info: Adequate Hearing Info: Adequate Speech Info: Adequate    SPECIAL CARE FACTORS FREQUENCY  PT (By licensed PT), OT (By licensed OT)     PT Frequency:  (5) OT Frequency:  (5)            Contractures      Additional Factors Info  Code Status, Allergies Code Status Info:  (Full Code) Allergies Info:  ( Gabapentin, Hydrocodone,  Percocet Oxycodone-acetaminophen)           Current Medications (08/01/2016):  This is the current hospital active medication list Current Facility-Administered Medications  Medication Dose Route Frequency Provider Last Rate Last Dose  . acetaminophen (TYLENOL) tablet 650 mg  650 mg Oral Q6H PRN Corky Mull, MD       Or  . acetaminophen (TYLENOL) suppository 650 mg  650 mg Rectal Q6H PRN Corky Mull, MD      . acetaminophen (TYLENOL) tablet 1,000 mg  1,000 mg Oral Q6H Corky Mull, MD      . aspirin EC tablet 325 mg  325 mg Oral Daily Corky Mull, MD      . atorvastatin (LIPITOR) tablet 40 mg  40 mg Oral q1800 Corky Mull, MD      . bisacodyl (DULCOLAX) suppository 10 mg  10 mg Rectal Daily PRN Corky Mull, MD      . ceFAZolin (ANCEF) IVPB 2 g/50 mL premix  2 g Intravenous Q6H Corky Mull, MD   2 g at 08/01/16 1346  . cholecalciferol (VITAMIN D) tablet 2,000 Units  2,000 Units Oral BID Corky Mull, MD      . citalopram (CELEXA) tablet 40 mg  40 mg Oral Daily Marshall Cork Poggi, MD      . dextrose 5 % and 0.9 % NaCl with KCl 20 mEq/L infusion   Intravenous Continuous Corky Mull, MD 100 mL/hr at 08/01/16 1346    .  diphenhydrAMINE (BENADRYL) 12.5 MG/5ML elixir 12.5-25 mg  12.5-25 mg Oral Q4H PRN Corky Mull, MD      . diphenhydrAMINE (BENADRYL) tablet 25 mg  25 mg Oral Q6H PRN Corky Mull, MD      . docusate sodium (COLACE) capsule 100 mg  100 mg Oral BID Corky Mull, MD      . Derrill Memo ON 08/02/2016] enoxaparin (LOVENOX) injection 40 mg  40 mg Subcutaneous Q24H Corky Mull, MD      . fluconazole (DIFLUCAN) tablet 150 mg  150 mg Oral Daily Corky Mull, MD      . fluticasone (FLONASE) 50 MCG/ACT nasal spray 2 spray  2 spray Each Nare Daily PRN Corky Mull, MD      . Derrill Memo ON 08/02/2016] hydrochlorothiazide (MICROZIDE) capsule 12.5 mg  12.5 mg Oral Daily Corky Mull, MD      . HYDROmorphone (DILAUDID) 1 MG/ML injection           . HYDROmorphone (DILAUDID) injection 1-2 mg  1-2 mg  Intravenous Q2H PRN Corky Mull, MD      . HYDROmorphone (DILAUDID) tablet 2-4 mg  2-4 mg Oral Q4H PRN Corky Mull, MD      . ketorolac (TORADOL) 15 MG/ML injection 15 mg  15 mg Intravenous Q6H Corky Mull, MD      . lamoTRIgine (LAMICTAL) tablet 200 mg  200 mg Oral BID Corky Mull, MD      . latanoprost (XALATAN) 0.005 % ophthalmic solution 2 drop  2 drop Both Eyes QHS Corky Mull, MD      . Derrill Memo ON 08/02/2016] lisinopril (PRINIVIL,ZESTRIL) tablet 40 mg  40 mg Oral Daily Corky Mull, MD      . magnesium hydroxide (MILK OF MAGNESIA) suspension 30 mL  30 mL Oral Daily PRN Corky Mull, MD      . magnesium oxide (MAG-OX) tablet 400 mg  400 mg Oral Daily Corky Mull, MD      . metoCLOPramide (REGLAN) tablet 5-10 mg  5-10 mg Oral Q8H PRN Corky Mull, MD       Or  . metoCLOPramide (REGLAN) injection 5-10 mg  5-10 mg Intravenous Q8H PRN Corky Mull, MD      . multivitamin with minerals tablet 1 tablet  1 tablet Oral Daily Corky Mull, MD      . ondansetron [facility]) tablet 4 mg  4 mg Oral Q6H PRN Corky Mull, MD       Or  . ondansetron (ZOFRAN) injection 4 mg  4 mg Intravenous Q6H PRN Corky Mull, MD      . pantoprazole (PROTONIX) EC tablet 40 mg  40 mg Oral BID Corky Mull, MD      . polyvinyl alcohol (LIQUIFILM TEARS) 1.4 % ophthalmic solution 1 drop  1 drop Both Eyes PRN Corky Mull, MD      . sodium phosphate (FLEET) 7-19 GM/118ML enema 1 enema  1 enema Rectal Once PRN Corky Mull, MD      . vitamin C (ASCORBIC ACID) tablet 1,000 mg  1,000 mg Oral QID Corky Mull, MD         Discharge Medications: Please see discharge summary for a list of discharge medications.  Relevant Imaging Results:  Relevant Lab Results:   Additional Information  (SSN: [id number])  Danie Chandler, Student-Social Work

## 2016-08-01 NOTE — Progress Notes (Signed)
Patient is admitted to room 155 from surgery via room bed. Left arm in immobilizer with cryo cuff in place. A&O x4, but very sleepy. IV fluids started. Reviewed POC and orders. Bed alarm on for safety.

## 2016-08-01 NOTE — Anesthesia Post-op Follow-up Note (Cosign Needed)
Anesthesia QCDR form completed.        

## 2016-08-01 NOTE — Transfer of Care (Signed)
Immediate Anesthesia Transfer of Care Note  Patient: Sydney Evans  Procedure(s) Performed: Procedure(s): TOTAL SHOULDER ARTHROPLASTY (Left)  Patient Location: PACU  Anesthesia Type:General  Level of Consciousness: awake and patient cooperative  Airway & Oxygen Therapy: Patient Spontanous Breathing and Patient connected to nasal cannula oxygen  Post-op Assessment: Report given to RN and Post -op Vital signs reviewed and stable  Post vital signs: Reviewed and stable  Last Vitals:  Vitals:   08/01/16 0632  BP: (!) 149/63  Pulse: (!) 59  Resp: 18  Temp: 36.3 C    Last Pain:  Vitals:   08/01/16 0632  TempSrc: Oral  PainSc: 9          Complications: No apparent anesthesia complications

## 2016-08-01 NOTE — Op Note (Signed)
08/01/2016  10:24 AM  Patient:   Sydney Evans  Pre-Op Diagnosis:   Degenerative joint disease, left shoulder.  Post-Op Diagnosis:   Same.  Procedure:   Left total shoulder arthroplasty.  Surgeon:   Pascal Lux, MD  Assistant:   Cameron Proud, PA-C  Anesthesia:   General endotracheal intubation with an interscalene block.  Findings:   As above. The rotator cuff was in satisfactory condition.  Complications:   None  EBL:  150 cc  Fluids:   900 cc crystalloid  UOP:   100 cc  TT:   None  Drains:   None  Closure:   Staples  Implants:   Biomet Comprehensive system with a pressfit #10 mini-humeral stem, a 42 x 18 mm humeral head, and a small cemented glenoid component with a Regenerex post.  Brief Clinical Note:   The patient is a 66 year old female with a history of progressively worsening left shoulder pain. Her symptoms have progressed despite medications, activity modification, etc. Her history and examination consistent with advanced degenerative joint disease, confirmed by plain radiographs. An MRI scan has demonstrated at her rotator cuff appears to be intact. The patient presents at this time for a left total shoulder arthroplasty.  Procedure:   The patient was brought into the operating room and lain in the supine position on the OR table. The patient then underwent general endotracheal intubation and anesthesia before being repositioned in the beach chair position using the beach chair positioner. A Foley catheter was placed by the nurse prior to positioning the patient. The left shoulder and upper extremity were prepped with ChloraPrep solution before being draped sterilely. Preoperative antibiotics were administered. A standard anterior approach to the shoulder was made through an approximately 4-5 inch incision. The incision was carried down through the subcutaneous tissues to expose the deltopectoral fascia. The interval between the deltoid and pectoralis muscles  was identified and this plane developed, retracting the cephalic vein laterally with the deltoid muscle. The conjoined tendon was identified. The lateral margin was dissected and the Kolbel self-retraining retractor inserted. The "three sisters" were identified and cauterized. Bursal tissues were removed to improve visualization. The subscapularis tendon was released from its attachment to the lesser tuberosity 1 cm proximal to its insertion and several tagging sutures placed. The inferior capsule was released with care after identifying and protecting the axillary nerve. The proximal humeral cut was made at approximately 30 of retroversion using the extra-medullary guide.   Attention was directed to the glenoid. The labrum was debrided circumferentially before the center of the glenoid was marked with electrocautery. The small and medium sizers were positioned and it was elected to proceed with a small glenoid component. The guidewire was drilled into the glenoid neck using the appropriate guide. After verifying its position, the glenoid was lightly reamed with the butterfly reamer before the centralizing Regenerex post reamer was used. The small peripheral peg guide was positioned and each of the three pegs drilled sequentially, leaving the preceding drill bit in place so as to minimize shifting of the guide. The bony surfaces were prepared for cementing by irrigating them thoroughly with bacitracin saline solution using the jet lavage system, then packing the glenoid with a Neo-Synephrine soaked sponge. Meanwhile, cement was mixed on the back table. When it was ready, some cement was injected into each of the three peg holes using a Toomey syringe and additional cement applied to the posterior aspect of the glenoid component. The component was impacted into place and  the excess cement was removed using a Surveyor, quantity. Pressure was maintained on the glenoid until the cement hardened.  Attention was  directed to the humeral side. The humeral canal was reamed sequentially beginning with the end-cutting reamer then progressing from a 4 mm reamer up to a 10 mm reamer. This provided excellent circumferential chatter. The canal was broached beginning with a #8 broach and progressing to a #10 broach. This was left in place and a trial reduction performed using both the 42 x 18 mm and the 46 x 18 mm trial humeral heads. Utilizing the 42 x 18 mm head, the arm demonstrated excellent range of motion as the hand could be brought across the chest to the opposite shoulder and brought to the top of the patient's head and to the patient's ear. The shoulder remained stable throughout this range of motion, and was stable with abduction and external rotation. The joint was dislocated and the trial components removed. The permanent #10 mini-stem was impacted into place with care taken to maintain the appropriate version. The permanent 42 x 18 mm humeral head with the standard taper set in the "C" position was put together on the back table and impacted into place. Again, the [facility] taper locking mechanism was verified using manual distraction. The shoulder was relocated and again placed through a range of motion with the findings as described above.  The wound was copiously irrigated with bacitracin saline solution using the jet lavage system before a total of 20 cc of Exparel diluted out to 60 cc with normal saline and 30 cc of 0.5% Sensorcaine with epinephrine was injected into the pericapsular and peri-incisional tissues to help with postoperative analgesia. The subscapularis tendon was reapproximated using #2 FiberWire interrupted sutures. The deltopectoral interval was closed using #0 Vicryl interrupted sutures before the subcutaneous tissues were closed using 2-0 Vicryl interrupted sutures. The skin was closed using staples. Prior to closing the skin, 1 g of transexemic acid in 10 cc of normal saline was injected  intra-articularly to help with postoperative bleeding. A sterile occlusive dressing was applied to the wound before the arm was placed into a shoulder immobilizer with an abduction pillow. A polar care device also was applied to the shoulder. The patient was then transferred back to a hospital bed before being awakened, extubated, and returned to the recovery room in satisfactory condition after tolerating the procedure well.

## 2016-08-01 NOTE — Anesthesia Procedure Notes (Signed)
Procedure Name: Intubation Date/Time: 08/01/2016 7:49 AM Performed by: Rosaria Ferries, Calub Tarnow Pre-anesthesia Checklist: Patient identified, Emergency Drugs available, Suction available and Patient being monitored Patient Re-evaluated:Patient Re-evaluated prior to inductionOxygen Delivery Method: Circle system utilized Preoxygenation: Pre-oxygenation with 100% oxygen Intubation Type: IV induction Laryngoscope Size: Mac Grade View: Grade I Tube type: Oral Tube size: 7.0 mm Number of attempts: 1 Placement Confirmation: ETT inserted through vocal cords under direct vision,  positive ETCO2 and breath sounds checked- equal and bilateral Secured at: 21 cm Tube secured with: Tape Dental Injury: Teeth and Oropharynx as per pre-operative assessment

## 2016-08-02 LAB — URINE CULTURE

## 2016-08-02 LAB — BASIC METABOLIC PANEL
ANION GAP: 7 (ref 5–15)
BUN: 9 mg/dL (ref 6–20)
CALCIUM: 8.8 mg/dL — AB (ref 8.9–10.3)
CO2: 24 mmol/L (ref 22–32)
Chloride: 105 mmol/L (ref 101–111)
Creatinine, Ser: 0.74 mg/dL (ref 0.44–1.00)
GFR calc Af Amer: >60 mL/min (ref >60)
GLUCOSE: 113 mg/dL — AB (ref 65–99)
Potassium: 3.6 mmol/L (ref 3.5–5.1)
SODIUM: 136 mmol/L (ref 135–145)

## 2016-08-02 LAB — CBC WITH DIFFERENTIAL/PLATELET
BASOS ABS: 0 10*3/uL (ref 0–0.1)
BASOS PCT: 0 %
EOS ABS: 0 10*3/uL (ref 0–0.7)
EOS PCT: 0 %
HCT: 33.1 % — ABNORMAL LOW (ref 35.0–47.0)
Hemoglobin: 10.8 g/dL — ABNORMAL LOW (ref 12.0–16.0)
Lymphocytes Relative: 15 %
Lymphs Abs: 1.3 10*3/uL (ref 1.0–3.6)
MCH: 25.1 pg — ABNORMAL LOW (ref 26.0–34.0)
MCHC: 32.6 g/dL (ref 32.0–36.0)
MCV: 76.9 fL — ABNORMAL LOW (ref 80.0–100.0)
MONO ABS: 0.6 10*3/uL (ref 0.2–0.9)
Monocytes Relative: 6 %
Neutro Abs: 7.3 10*3/uL — ABNORMAL HIGH (ref 1.4–6.5)
Neutrophils Relative %: 79 %
PLATELETS: 418 10*3/uL (ref 150–440)
RBC: 4.31 MIL/uL (ref 3.80–5.20)
RDW: 17.6 % — AB (ref 11.5–14.5)
WBC: 9.2 10*3/uL (ref 3.6–11.0)

## 2016-08-02 MED ORDER — HYDROMORPHONE HCL 2 MG PO TABS: 2.0000 mg | ORAL_TABLET | ORAL | 0 refills | Status: DC | PRN

## 2016-08-02 NOTE — Discharge Summary (Signed)
Physician Discharge Summary  Patient ID: Sydney Evans MRN: [id number] DOB/AGE: Oct 30, 1950 66 y.o.  Admit date: 08/01/2016 Discharge date: 08/02/2016  Admission Diagnoses:  PRIMARY OSTEOARTHRITIS,ROTATOR CUFF TENDINITIS LEFT  Discharge Diagnoses: Patient Active Problem List   Diagnosis Date Noted  . 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 TIA (transient ischemic attack) 12/23/2014  Left shoulder degenerative joint disease  Past Medical History:  Diagnosis Date  . Arthritis   . Bipolar disorder ([place])   . 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 Blood clot in vein   . Cancer ([place])    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  . Carotid arterial disease ([place])   . Contracture of joint of upper arm   . Elevated lipids   . GERD (gastroesophageal reflux disease)   . Hypertension   . Osteoporosis   . Poor balance   . Sinus congestion   . 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 TIA (transient ischemic attack)      Transfusion: None   Consultants (if any):   Discharged Condition: Improved  Hospital Course: Sydney Evans is an 66 y.o. female who was admitted 08/01/2016 with a diagnosis of left shoulder degenerative joint disease and went to the operating room on 08/01/2016 and underwent the above named procedures.    Surgeries: Procedure(s): TOTAL SHOULDER ARTHROPLASTY on 08/01/2016 Patient tolerated the surgery well. Taken to PACU where she was stabilized and then transferred to the orthopedic floor.  Started on Lovenox 40mg  q 24 hrs. Foot pumps applied bilaterally at 80 mm. Heels elevated on bed with rolled towels. No evidence of DVT. Negative Homan. Physical therapy started on day #1 for gait training and transfer. OT started day #1 for ADL and assisted devices.  Patient's IV and Foley were removed on POD1.  Implants: Biomet Comprehensive system with a pressfit #10 mini-humeral stem, a 42 x 18 mm humeral head, and a small cemented glenoid component with a Regenerex post.  She was given  perioperative antibiotics:  Anti-infectives    Start     Dose/Rate Route Frequency Ordered Stop   08/01/16 1400  fluconazole (DIFLUCAN) tablet 150 mg     150 mg Oral Daily 08/01/16 1235     08/01/16 1400  ceFAZolin (ANCEF) IVPB 2 g/50 mL premix     2 g 100 mL/hr over 30 Minutes Intravenous Every 6 hours 08/01/16 1257 08/02/16 0232   08/01/16 1245  ceFAZolin (ANCEF) IVPB 2g/100 mL premix  Status:  Discontinued     2 g 200 mL/hr over 30 Minutes Intravenous Every 6 hours 08/01/16 1235 08/01/16 1257   08/01/16 0700  ceFAZolin (ANCEF) IVPB 2g/100 mL premix     2 g 200 mL/hr over 30 Minutes Intravenous  Once 08/01/16 0124 08/01/16 0819   08/01/16 0553  ceFAZolin (ANCEF) 2-4 GM/100ML-% IVPB    Comments:  Ronnell Freshwater: cabinet override      08/01/16 0553 08/01/16 0804    .  She was given sequential compression devices, early ambulation, and Lovenox for DVT prophylaxis.  She benefited maximally from the hospital stay and there were no complications.    Recent vital signs:  Vitals:   08/02/16 0000 08/02/16 0352  BP: (!) 165/63 (!) 166/73  Pulse: 66 64  Resp: 16 18  Temp: 98.1 F (36.7 C) 98.1 F (36.7 C)    Recent laboratory studies:  Lab Results  Component Value Date   HGB 10.8 (L) 08/02/2016   HGB 10.6 (L) 06/26/2016   HGB  12.1 12/22/2014   Lab Results  Component Value Date   WBC 9.2 08/02/2016   PLT 418 08/02/2016   Lab Results  Component Value Date   INR 0.91 06/26/2016   Lab Results  Component Value Date   NA 136 08/02/2016   K 3.6 08/02/2016   CL 105 08/02/2016   CO2 24 08/02/2016   BUN 9 08/02/2016   CREATININE 0.74 08/02/2016   GLUCOSE 113 (H) 08/02/2016    Discharge Medications:   Allergies as of 08/02/2016      Reactions   Gabapentin    Dizzy and confusion   Hydrocodone Hives, Rash   "terrible scratching"    Percocet [oxycodone-acetaminophen] Itching, Rash   Toradol [ketorolac Tromethamine] Rash      Medication List    TAKE these medications    aspirin EC 325 MG tablet Take 1 tablet (325 mg total) by mouth daily.   atorvastatin 40 MG tablet Commonly known as:  LIPITOR Take 1 tablet (40 mg total) by mouth daily at 6 PM.   CALCIUM-MAGNESIUM-ZINC PO Take 1 tablet by mouth 2 (two) times daily. For bone & muscle health.   citalopram 40 MG tablet Commonly known as:  CELEXA Take 40 mg by mouth daily.   diclofenac 75 MG EC tablet Commonly known as:  VOLTAREN Take 75 mg by mouth 2 (two) times daily.   diphenhydrAMINE 25 MG tablet Commonly known as:  BENADRYL Take 25 mg by mouth every 6 (six) hours as needed for itching.   fluconazole 150 MG tablet Commonly known as:  DIFLUCAN Take 150 mg by mouth daily.   fluticasone 50 MCG/ACT nasal spray Commonly known as:  FLONASE Place 2 sprays into both nostrils daily as needed for allergies or rhinitis.   hydrochlorothiazide 12.5 MG capsule Commonly known as:  MICROZIDE Take 12.5 mg by mouth daily.   HYDROcodone-acetaminophen 5-325 MG tablet Commonly known as:  NORCO/VICODIN Take 1 tablet by mouth every 6 (six) hours as needed for moderate pain.   HYDROmorphone 2 MG tablet Commonly known as:  DILAUDID Take 1-2 tablets (2-4 mg total) by mouth every 4 (four) hours as needed for severe pain.   lamoTRIgine 200 MG tablet Commonly known as:  LAMICTAL Take 200 mg by mouth 2 (two) times daily.   latanoprost 0.005 % ophthalmic solution Commonly known as:  XALATAN Place 2 drops into both eyes at bedtime.   lisinopril 40 MG tablet Commonly known as:  PRINIVIL,ZESTRIL Take 40 mg by mouth daily.   LUBRICANT EYE DROPS OP Place 1 drop into both eyes daily.   Magnesium 250 MG Tabs Take 250 mg by mouth daily.   meloxicam 15 MG tablet Commonly known as:  MOBIC Take 15 mg by mouth daily as needed for pain.   multivitamin with minerals Tabs tablet Take 1 tablet by mouth daily. One-A-Day Multivitamin for Women 50+   omeprazole 40 MG capsule Commonly known as:  PRILOSEC Take  40 mg by mouth daily.   ranitidine 150 MG tablet Commonly known as:  ZANTAC Take 150 mg by mouth daily as needed for heartburn.   vitamin C with rose hips 1000 MG tablet Take 1,000 mg by mouth 4 (four) times daily.   Vitamin D 2000 units tablet Take 2,000 Units by mouth 2 (two) times daily.       Diagnostic Studies: Dg Shoulder Left Port  Result Date: 08/01/2016 CLINICAL DATA:  Status post left shoulder replacement EXAM: LEFT SHOULDER - 1 VIEW COMPARISON:  None. FINDINGS: Humeral  prosthesis is noted in place. No acute bony abnormality is seen. No soft tissue changes are noted. IMPRESSION: Status post left proximal humeral replacement Electronically Signed   By: Inez Catalina M.D.   On: 08/01/2016 11:15   Disposition: Plan will be for discharge home later this afternoon following sessions with PT.  Follow-up Information    Judson Roch, PA-C Follow up in 14 day(s).   Specialty:  Physician Assistant Why:  Electa Sniff information: [place] [id number] [phone]          Signed: Judson Roch PA-C 08/02/2016, 7:43 AM

## 2016-08-02 NOTE — Discharge Instructions (Signed)
Diet: As you were doing prior to hospitalization   Shower:  May shower but keep the wounds dry, use an occlusive plastic wrap, NO SOAKING IN TUB.  If the bandage gets wet, change with a clean dry gauze.  Dressing:  You may change your dressing as needed. Change the dressing with sterile gauze dressing.    Activity:  Increase activity slowly as tolerated, but follow the weight bearing instructions below.  No lifting or driving for 6 weeks.  Weight Bearing:   Non-weightbearing to the left arm.  Blood Clot Prevention: Instead of taking 1 aspirin daily, take 1 tablet in the morning and one tablet at night.  To prevent constipation: you may use a stool softener such as -  Colace (over the counter) 100 mg by mouth twice a day  Drink plenty of fluids (prune juice may be helpful) and high fiber foods Miralax (over the counter) for constipation as needed.    Itching:  If you experience itching with your medications, try taking only a single pain pill, or even half a pain pill at a time.  You may take up to 10 pain pills per day, and you can also use benadryl over the counter for itching or also to help with sleep.   Precautions:  If you experience chest pain or shortness of breath - call 911 immediately for transfer to the hospital emergency department!!  If you develop a fever greater that 101 F, purulent drainage from wound, increased redness or drainage from wound, or calf pain-Call [place]                                              Follow- Up Appointment:  Please call for an appointment to be seen in 2 weeks at [facility]

## 2016-08-02 NOTE — Progress Notes (Signed)
qPhysical Therapy Treatment Patient Details Name: Sydney Evans MRN: [id number] DOB: 07-16-1950 Today's Date: 08/02/2016    History of Present Illness Pt admitted for L proximal humerus replacement.     PT Comments    Pt is making good progress towards goals with ability to ambulate in hallway with [facility] as well as perform stair training correctly. Pt given written HEP and reviewed all questions. Pt has met goals for discharge this date. Appears motivated to perform therapy, slightly limited by pain. RN aware.    Follow Up Recommendations  Home health PT     Equipment Recommendations  None recommended by PT    Recommendations for Other Services       Precautions / Restrictions Precautions Precautions: Fall;Shoulder Shoulder Interventions: Shoulder abduction pillow;Shoulder sling/immobilizer Precaution Booklet Issued: Yes (comment) Restrictions Weight Bearing Restrictions: Yes LUE Weight Bearing: Non weight bearing    Mobility  Bed Mobility               General bed mobility comments: not performed as pt received in recliner  Transfers Overall transfer level: Needs assistance Equipment used: Straight cane Transfers: Sit to/from Stand Sit to Stand: Supervision         General transfer comment: Safe technique with upright posture noted. Pt able to use SPC in R hand  Ambulation/Gait Ambulation/Gait assistance: Min guard Ambulation Distance (Feet): 275 Feet Assistive device: Straight cane Gait Pattern/deviations: Step-through pattern     General Gait Details: ambulated in hallway with reciprocal gait pattern. Pt able to carry conversation during ambulation without LOB. Used SPC correctly. Pt reports increased pain with mobility.   Stairs Stairs: Yes   Stair Management: Step to pattern;With cane Number of Stairs: 1 General stair comments: Pt performed stair training 2 x 1 step with SPC and step to gait pattern. Safe technique performed with cues for [facility]  sequencing.  Wheelchair Mobility    Modified Rankin (Stroke Patients Only)       Balance                                            Cognition Arousal/Alertness: Awake/alert Behavior During Therapy: WFL for tasks assessed/performed Overall Cognitive Status: Within Functional Limits for tasks assessed                                        Exercises Other Exercises Other Exercises: Pt performed written HEP including grip squeezes, wrist flexion/extension, and elbow flexion/extension. Pt also performed 1 rep of lateral neck flexion. All ther-ex performed x 5 reps. All questions answered and written HEP reviewed. Supervision given for performance.    General Comments        Pertinent Vitals/Pain Pain Assessment: 0-10 Pain Score: 9  Pain Location: L shoulder Pain Descriptors / Indicators: Aching;Operative site guarding Pain Intervention(s): Limited activity within patient's tolerance;RN gave pain meds during session;Ice applied    Home Living                      Prior Function            PT Goals (current goals can now be found in the care plan section) Acute Rehab PT Goals Patient Stated Goal: to go home PT Goal Formulation: With patient Time For Goal Achievement: 08/15/16 Potential  to Achieve Goals: Good Progress towards PT goals: Progressing toward goals    Frequency    BID      PT Plan Current plan remains appropriate    Co-evaluation             End of Session Equipment Utilized During Treatment: Gait belt Activity Tolerance: Patient tolerated treatment well Patient left: in chair;with chair alarm set;with family/visitor present;with SCD's reapplied Nurse Communication: Mobility status PT Visit Diagnosis: Unsteadiness on feet (R26.81);Muscle weakness (generalized) (M62.81);Difficulty in walking, not elsewhere classified (R26.2);Pain Pain - Right/Left: Left Pain - part of body: Shoulder     Time:  4707-6151 PT Time Calculation (min) (ACUTE ONLY): 22 min  Charges:  $Gait Training: 8-22 mins                    G Codes:       Greggory Stallion, PT, DPT [phone]    Sydney Evans 08/02/2016, 10:32 AM

## 2016-08-02 NOTE — Care Management Note (Signed)
Case Management Note  Patient Details  Name: Sydney Evans MRN: [id number] Date of Birth: 08-30-1950  Subjective/Objective:  Met with patient at bedside to discuss discharge planning. Patient will be discharging home to daughters house. Verified address on face sheet. Patient agreeable to home health. [facility] as she has used [facility] in the past. Referral to [place] for PT/OT. Will discharge on [facility]. No DME needs. Case Closed                  Action/Plan:   Expected Discharge Date:  08/02/16               Expected Discharge Plan:  [place]  In-House Referral:     Discharge planning Services  CM Consult  Post Acute Care Choice:  Home Health Choice offered to:  Patient  DME Arranged:    DME Agency:     HH Arranged:  OT, PT [place] Agency:  [facility]  Status of Service:  Completed, signed off  If discussed at [place] of Stay Meetings, dates discussed:    Additional Comments:  Jolly Mango, RN 08/02/2016, 10:41 AM

## 2016-08-02 NOTE — Progress Notes (Signed)
Clinical Social Worker (CSW) received SNF consult. PT is recommending home health. RN case manager aware of above. Please reconsult if future social work needs arise. CSW signing off.   Elias Bordner, LCSW [phone] 

## 2016-08-02 NOTE — Progress Notes (Signed)
Patient is discharged home with family. Cryo cuff and clean dressing in place. IV removed with cath intact. Reviewed discharge instruction, meds, scripts, and last dose given. Allowed time for questions.

## 2016-08-02 NOTE — Progress Notes (Signed)
Subjective: 1 Day Post-Op Procedure(s) (LRB): TOTAL SHOULDER ARTHROPLASTY (Left) Patient reports pain as severe, only took tylenol yesterday for pain but now requesting narcotic pain medication. Patient is well, and has had no acute complaints or problems Plan is to go Home after hospital stay. Negative for chest pain and shortness of breath Fever: no Gastrointestinal:Negative for nausea and vomiting this AM, nausea yesterday.  Objective: Vital signs in last 24 hours: Temp:  [97.5 F (36.4 C)-98.6 F (37 C)] 98.1 F (36.7 C) (03/30 0352) Pulse Rate:  [53-97] 64 (03/30 0352) Resp:  [9-22] 18 (03/30 0352) BP: (93-171)/(46-99) 166/73 (03/30 0352) SpO2:  [90 %-99 %] 98 % (03/30 0352) Weight:  [77.6 kg (171 lb)] 77.6 kg (171 lb) (03/29 1246)  Intake/Output from previous day:  Intake/Output Summary (Last 24 hours) at 08/02/16 0729 Last data filed at 08/02/16 0626  Gross per 24 hour  Intake          2866.67 ml  Output             2050 ml  Net           816.67 ml    Intake/Output this shift: No intake/output data recorded.  Labs:  Recent Labs  08/02/16 0523  HGB 10.8*    Recent Labs  08/02/16 0523  WBC 9.2  RBC 4.31  HCT 33.1*  PLT 418    Recent Labs  08/01/16 1637 08/02/16 0523  NA  --  136  K  --  3.6  CL  --  105  CO2  --  24  BUN  --  9  CREATININE 1.05* 0.74  GLUCOSE  --  113*  CALCIUM  --  8.8*   No results for input(s): LABPT, INR in the last 72 hours.   EXAM General - Patient is Alert, Appropriate and Oriented Extremity - ABD soft Sensation intact distally Intact pulses distally Dorsiflexion/Plantar flexion intact Incision: dressing C/D/I No cellulitis present Dressing/Incision - clean, dry, no drainage Motor Function - intact, moving foot and toes well on exam.  Pt is intact to light touch over the left deltoid muscle.  Past Medical History:  Diagnosis Date  . Arthritis   . Bipolar disorder ([place])   . 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 Blood clot in vein   . Cancer ([place])    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  . Carotid arterial disease ([place])   . Contracture of joint of upper arm   . Elevated lipids   . GERD (gastroesophageal reflux disease)   . Hypertension   . Osteoporosis   . Poor balance   . Sinus congestion   . 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 TIA (transient ischemic attack)     Assessment/Plan: 1 Day Post-Op Procedure(s) (LRB): TOTAL SHOULDER ARTHROPLASTY (Left) Active Problems:   Status post total shoulder replacement, left  Estimated body mass index is 30.29 kg/m as calculated from the following:   Height as of this encounter: 5\' 3"  (1.6 m).   Weight as of this encounter: 77.6 kg (171 lb). [facility] diet Up with therapy D/C IV fluids when tolerating po intake.  Labs reviewed. Foley to be removed today, will need to urinate prior to discharge. Up with therapy, plan is for discharge home with HHPT. Plan will be for discharge today pending progress with PT and pain control.  DVT Prophylaxis - Lovenox, Foot Pumps and TED hose Non-weightbearing to the left arm.  Raquel Tatum Massman, PA-C [facility] 08/02/2016, 7:29 AM

## 2016-08-02 NOTE — Evaluation (Signed)
Occupational Therapy Evaluation Patient Details Name: Sydney Evans MRN: [id number] DOB: 22-Apr-1951 Today's Date: 08/02/2016    History of Present Illness Pt. was admitted to [facility] for a Left Total Shoulder Replacement.    Clinical Impression   Pt. is a 66 y.o. female who was admitted to [facility] for a Left Total Shoulder Replacement. Pt. 9/10 pain, left shoulder immobilization, and decreased functional mobility which hinder her ability to complete ADL, and IADL functioning. Pt. Education was provided about sling care, application, and removal. Pt. could benefit from skilled OT services for ADL training, A/E training, work simplification techniques, and pt./family education about home modification, and DME.  Pt. Plans to go to her daughters home upon discharge, prior to returning to her own apartment at the end of April.    Follow Up Recommendations  Home health OT    Equipment Recommendations       Recommendations for Other Services       Precautions / Restrictions Precautions Precautions: Fall Shoulder Interventions: Shoulder abduction pillow;Shoulder sling/immobilizer Precaution Booklet Issued: Yes (comment) Restrictions Weight Bearing Restrictions: Yes LUE Weight Bearing: Non weight bearing                                                    ADL either performed or assessed with clinical judgement   ADL Overall ADL's : Needs assistance/impaired Eating/Feeding: Set up   Grooming: Set up;Minimal assistance           Upper Body Dressing : Maximal assistance   Lower Body Dressing: Set up          Functional Mobility: [place] Patient Visual Report: No change from baseline       Perception     Praxis      Pertinent Vitals/Pain Pain Assessment: 0-10 Pain Score: 9  Faces Pain Scale: Hurts little more Pain Location: Left shoulder Pain Descriptors / Indicators: Aching Pain Intervention(s): Limited activity within  patient's tolerance;RN gave pain meds during session     Hand Dominance Right   Extremity/Trunk Assessment Upper Extremity Assessment Upper Extremity Assessment:  (L UE is immobilized. )           Communication Communication Communication: No difficulties   Cognition Arousal/Alertness: Awake/alert Behavior During Therapy: WFL for tasks assessed/performed Overall Cognitive Status: Within Functional Limits for tasks assessed                                     General Comments       Exercises   Shoulder Instructions      Home Living Family/patient expects to be discharged to:: Private residence Living Arrangements: Alone Available Help at Discharge: Family Type of Home: Scientist, physiological of Steps: None Entrance Stairs-Rails: Right Home Layout: One level     Bathroom Shower/Tub: Walk-in Hydrologist: Standard     Home Equipment: Cane - single point          Prior Functioning/Environment Level of Independence: Independent with assistive device(s)        Comments: Pt. was independent with ADLs, IADLs, driving, meal prep, medication management.        OT Problem List: Impaired UE functional  use;Decreased knowledge of use of DME or AE;Pain;Decreased range of motion;Decreased activity tolerance;Decreased coordination      OT Treatment/Interventions: Self-care/ADL training;Therapeutic exercise;Therapeutic activities;Energy conservation;Cognitive remediation/compensation;DME and/or AE instruction;Patient/family education    OT Goals(Current goals can be found in the care plan section) Acute Rehab OT Goals Patient Stated Goal: To return home OT Goal Formulation: With patient Potential to Achieve Goals: Good  OT Frequency: Min 1X/week   Barriers to D/C: Decreased caregiver support          Co-evaluation              End of Session    Activity Tolerance: Patient tolerated treatment  well Patient left: in chair  OT Visit Diagnosis: Muscle weakness (generalized) (M62.81)                Time: 4742-5956 OT Time Calculation (min): 26 min Charges:  OT General Charges $OT Visit: 1 Procedure OT Evaluation $OT Eval Moderate Complexity: 1 Procedure G-Codes:    Harrel Carina, MS, OTR/L   Harrel Carina, MS, OTR/L 08/02/2016, 11:36 AM

## 2016-08-05 LAB — SURGICAL PATHOLOGY

## 2016-08-08 ENCOUNTER — Emergency Department
Admission: EM | Admit: 2016-08-08 | Discharge: 2016-08-08 | Disposition: A | Discharge status: Home / Self Care | Payer: Medicare Other | Attending: Emergency Medicine | Admitting: Emergency Medicine

## 2016-08-08 DIAGNOSIS — F17210 Nicotine dependence, cigarettes, uncomplicated: Secondary | ICD-10-CM | POA: Diagnosis not present

## 2016-08-08 DIAGNOSIS — R55 Syncope and collapse: Secondary | ICD-10-CM | POA: Insufficient documentation

## 2016-08-08 DIAGNOSIS — Z7982 Long term (current) use of aspirin: Secondary | ICD-10-CM | POA: Insufficient documentation

## 2016-08-08 DIAGNOSIS — I10 Essential (primary) hypertension: Secondary | ICD-10-CM | POA: Insufficient documentation

## 2016-08-08 DIAGNOSIS — Z5321 Procedure and treatment not carried out due to patient leaving prior to being seen by health care provider: Secondary | ICD-10-CM | POA: Insufficient documentation

## 2016-08-08 NOTE — ED Notes (Signed)
Arrives via [facility] for complaint of syncope yesterday.  While in Triage, patient states "I'm not staying here".  And got up and left ED.  Patient was accompanied by daughter. Ambulated with easy and steady gait.

## 2016-09-05 ENCOUNTER — Other Ambulatory Visit (INDEPENDENT_AMBULATORY_CARE_PROVIDER_SITE_OTHER): Payer: Self-pay | Admitting: Vascular Surgery

## 2016-09-05 DIAGNOSIS — I6521 Occlusion and stenosis of right carotid artery: Secondary | ICD-10-CM

## 2016-09-09 ENCOUNTER — Encounter (INDEPENDENT_AMBULATORY_CARE_PROVIDER_SITE_OTHER): Payer: Medicare Other

## 2016-09-09 ENCOUNTER — Ambulatory Visit (INDEPENDENT_AMBULATORY_CARE_PROVIDER_SITE_OTHER): Payer: Self-pay | Admitting: Vascular Surgery

## 2016-10-14 ENCOUNTER — Ambulatory Visit (INDEPENDENT_AMBULATORY_CARE_PROVIDER_SITE_OTHER): Payer: Self-pay | Admitting: Vascular Surgery

## 2016-10-14 ENCOUNTER — Encounter (INDEPENDENT_AMBULATORY_CARE_PROVIDER_SITE_OTHER): Payer: Medicare Other

## 2016-10-28 ENCOUNTER — Other Ambulatory Visit: Payer: Self-pay | Admitting: Surgery

## 2016-10-28 DIAGNOSIS — Z96612 Presence of left artificial shoulder joint: Secondary | ICD-10-CM

## 2016-10-31 ENCOUNTER — Ambulatory Visit: Payer: Medicare Other

## 2016-11-01 ENCOUNTER — Ambulatory Visit
Admission: RE | Admit: 2016-11-01 | Discharge: 2016-11-01 | Disposition: A | Discharge status: Home / Self Care | Payer: Medicare Other | Source: Ambulatory Visit | Attending: Surgery | Admitting: Surgery

## 2016-11-01 DIAGNOSIS — M7592 Shoulder lesion, unspecified, left shoulder: Secondary | ICD-10-CM | POA: Insufficient documentation

## 2016-11-01 DIAGNOSIS — Z96612 Presence of left artificial shoulder joint: Secondary | ICD-10-CM | POA: Insufficient documentation

## 2016-11-14 ENCOUNTER — Ambulatory Visit (INDEPENDENT_AMBULATORY_CARE_PROVIDER_SITE_OTHER): Payer: Medicare Other

## 2016-11-14 ENCOUNTER — Ambulatory Visit (INDEPENDENT_AMBULATORY_CARE_PROVIDER_SITE_OTHER): Payer: Medicare Other | Admitting: Vascular Surgery

## 2016-11-14 ENCOUNTER — Encounter (INDEPENDENT_AMBULATORY_CARE_PROVIDER_SITE_OTHER): Payer: Self-pay | Admitting: Vascular Surgery

## 2016-11-14 DIAGNOSIS — E782 Mixed hyperlipidemia: Secondary | ICD-10-CM | POA: Diagnosis not present

## 2016-11-14 DIAGNOSIS — I6523 Occlusion and stenosis of bilateral carotid arteries: Secondary | ICD-10-CM

## 2016-11-14 DIAGNOSIS — I10 Essential (primary) hypertension: Secondary | ICD-10-CM | POA: Diagnosis not present

## 2016-11-14 DIAGNOSIS — K219 Gastro-esophageal reflux disease without esophagitis: Secondary | ICD-10-CM | POA: Diagnosis not present

## 2016-11-14 DIAGNOSIS — I6521 Occlusion and stenosis of right carotid artery: Secondary | ICD-10-CM

## 2016-11-15 ENCOUNTER — Emergency Department: Payer: Medicare Other

## 2016-11-15 ENCOUNTER — Emergency Department
Admission: EM | Admit: 2016-11-15 | Discharge: 2016-11-15 | Disposition: A | Discharge status: Home / Self Care | Payer: Medicare Other | Attending: Emergency Medicine | Admitting: Emergency Medicine

## 2016-11-15 DIAGNOSIS — Z79899 Other long term (current) drug therapy: Secondary | ICD-10-CM | POA: Insufficient documentation

## 2016-11-15 DIAGNOSIS — Z8673 Personal history of transient ischemic attack (TIA), and cerebral infarction without residual deficits: Secondary | ICD-10-CM | POA: Diagnosis not present

## 2016-11-15 DIAGNOSIS — Z7982 Long term (current) use of aspirin: Secondary | ICD-10-CM | POA: Diagnosis not present

## 2016-11-15 DIAGNOSIS — I2510 Atherosclerotic heart disease of native coronary artery without angina pectoris: Secondary | ICD-10-CM | POA: Diagnosis not present

## 2016-11-15 DIAGNOSIS — R42 Dizziness and giddiness: Secondary | ICD-10-CM

## 2016-11-15 DIAGNOSIS — Z96653 Presence of artificial knee joint, bilateral: Secondary | ICD-10-CM | POA: Diagnosis not present

## 2016-11-15 DIAGNOSIS — R2681 Unsteadiness on feet: Secondary | ICD-10-CM | POA: Diagnosis not present

## 2016-11-15 DIAGNOSIS — I10 Essential (primary) hypertension: Secondary | ICD-10-CM | POA: Diagnosis not present

## 2016-11-15 DIAGNOSIS — F17210 Nicotine dependence, cigarettes, uncomplicated: Secondary | ICD-10-CM | POA: Insufficient documentation

## 2016-11-15 DIAGNOSIS — Z9181 History of falling: Secondary | ICD-10-CM | POA: Insufficient documentation

## 2016-11-15 LAB — COMPREHENSIVE METABOLIC PANEL
ALT: 11 U/L — AB (ref 14–54)
AST: 19 U/L (ref 15–41)
Albumin: 3.8 g/dL (ref 3.5–5.0)
Alkaline Phosphatase: 90 U/L (ref 38–126)
Anion gap: 7 (ref 5–15)
BUN: 11 mg/dL (ref 6–20)
CHLORIDE: 103 mmol/L (ref 101–111)
CO2: 27 mmol/L (ref 22–32)
CREATININE: 0.99 mg/dL (ref 0.44–1.00)
Calcium: 9.4 mg/dL (ref 8.9–10.3)
GFR calc Af Amer: >60 mL/min (ref >60)
GFR, EST NON AFRICAN AMERICAN: 59 mL/min — AB (ref >60)
Glucose, Bld: 109 mg/dL — ABNORMAL HIGH (ref 65–99)
Potassium: 3.4 mmol/L — ABNORMAL LOW (ref 3.5–5.1)
Sodium: 137 mmol/L (ref 135–145)
Total Bilirubin: 0.5 mg/dL (ref 0.3–1.2)
Total Protein: 6.9 g/dL (ref 6.5–8.1)

## 2016-11-15 LAB — CBC
HCT: 31 % — ABNORMAL LOW (ref 35.0–47.0)
HEMOGLOBIN: 9.9 g/dL — AB (ref 12.0–16.0)
MCH: 22.9 pg — AB (ref 26.0–34.0)
MCHC: 31.9 g/dL — AB (ref 32.0–36.0)
MCV: 71.6 fL — ABNORMAL LOW (ref 80.0–100.0)
Platelets: 500 10*3/uL — ABNORMAL HIGH (ref 150–440)
RBC: 4.32 MIL/uL (ref 3.80–5.20)
RDW: 17.7 % — AB (ref 11.5–14.5)
WBC: 10 10*3/uL (ref 3.6–11.0)

## 2016-11-15 LAB — DIFFERENTIAL
BASOS ABS: 0.1 10*3/uL (ref 0–0.1)
BASOS PCT: 1 %
Eosinophils Absolute: 0.2 10*3/uL (ref 0–0.7)
Eosinophils Relative: 2 %
LYMPHS ABS: 2.3 10*3/uL (ref 1.0–3.6)
LYMPHS PCT: 23 %
MONOS PCT: 6 %
Monocytes Absolute: 0.5 10*3/uL (ref 0.2–0.9)
NEUTROS ABS: 6.9 10*3/uL — AB (ref 1.4–6.5)
Neutrophils Relative %: 68 %

## 2016-11-15 LAB — PROTIME-INR
INR: 0.92
PROTHROMBIN TIME: 12.4 s (ref 11.4–15.2)

## 2016-11-15 LAB — APTT: aPTT: 26 seconds (ref 24–36)

## 2016-11-15 LAB — ETHANOL

## 2016-11-15 LAB — TROPONIN I

## 2016-11-15 MED ORDER — MECLIZINE HCL 25 MG PO TABS: 25.0000 mg | ORAL_TABLET | Freq: Three times a day (TID) | ORAL | 0 refills | Status: DC | PRN

## 2016-11-15 MED ORDER — DIAZEPAM 5 MG PO TABS
5.0000 mg | ORAL_TABLET | Freq: Once | ORAL | Status: AC
  Administered 2016-11-15: 5 mg via ORAL

## 2016-11-15 MED ORDER — MECLIZINE HCL 25 MG PO TABS
25.0000 mg | ORAL_TABLET | Freq: Once | ORAL | Status: AC
  Administered 2016-11-15: 25 mg via ORAL
  Filled 2016-11-15: qty 1

## 2016-11-15 MED ORDER — DIAZEPAM 5 MG/ML IJ SOLN: 5.0000 mg | Freq: Once | INTRAMUSCULAR | Status: DC

## 2016-11-15 MED ORDER — DIAZEPAM 5 MG PO TABS
ORAL_TABLET | ORAL | Status: AC
  Administered 2016-11-15: 5 mg via ORAL
  Filled 2016-11-15: qty 1

## 2016-11-15 NOTE — ED Triage Notes (Signed)
Per EMS pt called out due to increasing dizziness for the last few days.  Pt fell at home outside today and hit her back.  Pt has a service dog "Dominica".  Jodi Mourning is a service dog due to previous stroke and she can alert pt when she is about to have a stroke.

## 2016-11-15 NOTE — ED Provider Notes (Signed)
[facility] Provider Note   CSN: [id number] Arrival date & time: 11/15/16  1356     History   Chief Complaint Chief Complaint  Patient presents with  . Dizziness    HPI Sydney Evans is a 66 y.o. female history of CAD, bipolar, hypertension, previous stroke with left-sided weakness here presenting with dizziness, trouble walking. Patient noticed progressive dizziness for the last 3 days. She states that she feels lightheaded and she also has been falling. She states that when she walks she did run into the wall and she feels very unsteady. Today she accidentally fell backwards and hit her head on the grass. She denies any chest pain or abdominal pain or vomiting or fevers. Patient states that she had a stroke several years ago and currently has a Neurosurgeon. She has a history of seizures but no recent seizures. She is not currently on blood thinners.  The history is provided by the patient.    Past Medical History:  Diagnosis Date  . Arthritis   . Bipolar disorder ([place])   . 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 Blood clot in vein   . Cancer ([place])    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  . Carotid arterial disease ([place])   . Contracture of joint of upper arm   . Elevated lipids   . GERD (gastroesophageal reflux disease)   . Hypertension   . Osteoporosis   . Poor balance   . Sinus congestion   . 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 TIA (transient ischemic attack)     Patient Active Problem List   Diagnosis Date Noted  . 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 TIA (transient ischemic attack) 12/23/2014    Past Surgical History:  Procedure Laterality Date  . APPENDECTOMY    . BREAST BIOPSY Left   . CATARACT EXTRACTION W/ INTRAOCULAR LENS  IMPLANT, BILATERAL Bilateral   . CHOLECYSTECTOMY    . femoral fx    . FOOT SURGERY    . KNEE SURGERY    . TONSILLECTOMY    . TOTAL KNEE ARTHROPLASTY Bilateral   . TOTAL SHOULDER ARTHROPLASTY Left 08/01/2016   Procedure: TOTAL  SHOULDER ARTHROPLASTY;  Surgeon: Corky Mull, MD;  Location: [facility] ORS;  Service: Orthopedics;  Laterality: Left;    OB History    No data available       Home Medications    Prior to Admission medications   Medication Sig Start Date End Date Taking? Authorizing Provider  Ascorbic Acid (VITAMIN C WITH ROSE HIPS) 1000 MG tablet Take 1,000 mg by mouth 4 (four) times daily.    [provider]  aspirin EC 325 MG tablet Take 1 tablet (325 mg total) by mouth daily. 12/23/14   Gladstone Lighter, MD  atorvastatin (LIPITOR) 40 MG tablet Take 1 tablet (40 mg total) by mouth daily at 6 PM. 12/23/14   Gladstone Lighter, MD  CALCIUM-MAGNESIUM-ZINC PO Take 1 tablet by mouth 2 (two) times daily. For bone & muscle health.    [provider]  Carboxymethylcellulose Sodium (LUBRICANT EYE DROPS OP) Place 1 drop into both eyes daily.    [provider]  Cholecalciferol (VITAMIN D) 2000 units tablet Take 2,000 Units by mouth 2 (two) times daily.    [provider]  citalopram (CELEXA) 40 MG tablet Take 40 mg by mouth daily.    [provider]  diclofenac (VOLTAREN) 75 MG EC tablet Take 75 mg by mouth 2 (  two) times daily.     [provider]  diphenhydrAMINE (BENADRYL) 25 MG tablet Take 25 mg by mouth every 6 (six) hours as needed for itching.    [provider]  fluconazole (DIFLUCAN) 150 MG tablet Take 150 mg by mouth daily.    [provider]  fluticasone (FLONASE) 50 MCG/ACT nasal spray Place 2 sprays into both nostrils daily as needed for allergies or rhinitis.    [provider]  hydrochlorothiazide (MICROZIDE) 12.5 MG capsule Take 12.5 mg by mouth daily.    [provider]  HYDROcodone-acetaminophen (NORCO/VICODIN) 5-325 MG tablet Take 1 tablet by mouth every 6 (six) hours as needed for moderate pain.    [provider]  HYDROmorphone (DILAUDID) 2 MG tablet Take 1-2 tablets (2-4 mg total) by mouth every 4  (four) hours as needed for severe pain. Patient not taking: Reported on 11/14/2016 08/02/16   Lattie Corns, PA-C  lamoTRIgine (LAMICTAL) 200 MG tablet Take 200 mg by mouth 2 (two) times daily.    [provider]  latanoprost (XALATAN) 0.005 % ophthalmic solution Place 2 drops into both eyes at bedtime.     [provider]  lisinopril (PRINIVIL,ZESTRIL) 40 MG tablet Take 40 mg by mouth daily.    [provider]  Magnesium 250 MG TABS Take 250 mg by mouth daily.     [provider]  meclizine (ANTIVERT) 25 MG tablet Take 1 tablet (25 mg total) by mouth 3 (three) times daily as needed for dizziness. 11/15/16   Drenda Freeze, MD  Melatonin 5 MG CAPS Take by mouth daily.    [provider]  meloxicam (MOBIC) 15 MG tablet Take 15 mg by mouth daily as needed for pain.     [provider]  Multiple Vitamin (MULTIVITAMIN WITH MINERALS) TABS tablet Take 1 tablet by mouth daily. One-A-Day Multivitamin for Women 50+    [provider]  omeprazole (PRILOSEC) 40 MG capsule Take 40 mg by mouth daily.    [provider]  ranitidine (ZANTAC) 150 MG tablet Take 150 mg by mouth daily as needed for heartburn.    [provider]    Family History Family History  Problem Relation Age of Onset  . CAD Mother     Social History Social History  Substance Use Topics  . Smoking status: Current Every Day Smoker    Packs/day: 0.25    Types: Cigarettes  . Smokeless tobacco: Never Used  . Alcohol use No     Allergies   Gabapentin; Ketorolac; Hydrocodone; Percocet [oxycodone-acetaminophen]; and Toradol [ketorolac tromethamine]   Review of Systems Review of Systems  Neurological: Positive for dizziness.  All other systems reviewed and are negative.    Physical Exam Updated Vital Signs BP (!) 190/83   Pulse (!) 54   Temp 98.5 F (36.9 C) (Oral)   Resp 18   Ht 5\' 2"  (1.575 m)   Wt 76.2 kg (168 lb)   SpO2 98%    BMI 30.73 kg/m   Physical Exam  Constitutional: She is oriented to person, place, and time. She appears well-developed and well-nourished.  HENT:  Head: Normocephalic.  Mouth/Throat: Oropharynx is clear and moist.  Eyes: Pupils are equal, round, and reactive to light. Conjunctivae and EOM are normal.  No obvious nystagmus   Neck: Normal range of motion. Neck supple.  Cardiovascular: Normal rate, regular rhythm and normal heart sounds.   Pulmonary/Chest: Effort normal and breath sounds normal. No respiratory distress. She  has no wheezes. She has no rales.  Abdominal: Soft. Bowel sounds are normal. She exhibits no distension. There is no tenderness.  Musculoskeletal: Normal range of motion. She exhibits no edema.  Neurological: She is alert and oriented to person, place, and time.  CN 2-12 intact. No facial droop. Strength 5/5 throughout. Abnormal finger to nose R side. She falls to the right side when ambulating and requires 2 people assistance with ambulation   Skin: Skin is warm.  Psychiatric: She has a normal mood and affect.  Nursing note and vitals reviewed.    ED Treatments / Results  Labs (all labs ordered are listed, but only abnormal results are displayed) Labs Reviewed  CBC - Abnormal; Notable for the following:       Result Value   Hemoglobin 9.9 (*)    HCT 31.0 (*)    MCV 71.6 (*)    MCH 22.9 (*)    MCHC 31.9 (*)    RDW 17.7 (*)    Platelets 500 (*)    All other components within normal limits  DIFFERENTIAL - Abnormal; Notable for the following:    Neutro Abs 6.9 (*)    All other components within normal limits  COMPREHENSIVE METABOLIC PANEL - Abnormal; Notable for the following:    Potassium 3.4 (*)    Glucose, Bld 109 (*)    ALT 11 (*)    GFR calc non Af Amer 59 (*)    All other components within normal limits  ETHANOL  PROTIME-INR  APTT  TROPONIN I    EKG  EKG Interpretation None       Radiology Mr Brain Wo Contrast  Result Date:  11/15/2016 CLINICAL DATA:  Worsening dizziness over the last few days. Fell today. EXAM: MRI HEAD WITHOUT CONTRAST TECHNIQUE: Multiplanar, multiecho pulse sequences of the brain and surrounding structures were obtained without intravenous contrast. COMPARISON:  12/23/2014 FINDINGS: Brain: Diffusion imaging does not show any acute or subacute infarction. The brainstem and cerebellum are normal. Cerebral hemispheres show old infarctions in the right posterolateral temporal lobe and right parietal lobe consistent with old right MCA territory embolic infarctions. Few scattered foci of T2 and FLAIR signal in the hemispheric white matter. No mass lesion, hemorrhage, hydrocephalus or extra-axial collection. Vascular: No flow in the right internal carotid artery. Left internal carotid artery shows flow. Posterior circulation shows flow. Skull and upper cervical spine: Negative Sinuses/Orbits: Clear/normal Other: None IMPRESSION: No acute finding. No specific cause of worsening dizziness identified. Old cortical and subcortical infarctions in the right posterior temporal lobe and right parietal lobe. No flow in the right internal carotid artery, a chronic finding. Electronically Signed   By: Nelson Chimes M.D.   On: 11/15/2016 15:07    Procedures Procedures (including critical care time)  Medications Ordered in ED Medications  meclizine (ANTIVERT) tablet 25 mg (25 mg Oral Given 11/15/16 1413)  diazepam (VALIUM) tablet 5 mg (5 mg Oral Given 11/15/16 1544)     Initial Impression / Assessment and Plan / ED Course  I have reviewed the triage vital signs and the nursing notes.  Pertinent labs & imaging results that were available during my care of the patient were reviewed by me and considered in my medical decision making (see chart for details).     Ryian Lynde is a 66 y.o. female here with dizziness, trouble walking. Has dysmetria on right and falling to the right with ambulation. I am concerned for  possible posterior stroke. She has chronic carotid  obstruction and has no neck pain so I doubt carotid dissection. Symptoms for 3 days and she is outside window for TPA. Will get MRI brain, labs. Will give meclizine for symptomatic relief.   3:18 pm  Labs unremarkable. MRI showed no stroke. Given meclizine but still unsteady. Dr. Doy Mince saw patient, recommend valium and likely need vestibular rehab. I talked to Dr. Bridgett Larsson from hospitalist, who request that I talk to social work for placement. I talked to social work and case management. Patient will need to pay out of pocket for placement. She states that she has only medicaid/medicare and doesn't want to be admitted and can't afford to pay out of pocket. She lives in [place] and wants to go home. I ordered home health PT/OT/aide. Will dc home with meclizine prn.    Final Clinical Impressions(s) / ED Diagnoses   Final diagnoses:  Dizziness    New Prescriptions Discharge Medication List as of 11/15/2016  4:54 PM    START taking these medications   Details  meclizine (ANTIVERT) 25 MG tablet Take 1 tablet (25 mg total) by mouth 3 (three) times daily as needed for dizziness., Starting Fri 11/15/2016, Print         Drenda Freeze, MD 11/15/16 2021

## 2016-11-15 NOTE — Consult Note (Signed)
Referring Physician: Darl Householder    Chief Complaint: Dizziness  HPI: Sydney Evans is an 66 y.o. female with a history of CAD, bipolar, hypertension, seizure disorder and previous stroke with left-sided weakness who presents with dizziness and trouble walking. Patient noticed progressive dizziness for the last 3 days. She states that she feels lightheaded and she also has been falling. She states that when she walks she did run into the wall and she feels very unsteady. Lightheaded sensation is present in the lying and standing positions with no change in severity based on position.  Does not describe vertigoToday she accidentally fell backwards and hit her head on the grass. Patient reports no recent history of medications changes.  No recent URI symptoms.  Initial NIHSS of 0.   Date last known well: Unable to determine Time last known well: Unable to determine tPA Given: No: Unable to determine LKW  Past Medical History:  Diagnosis Date  . Arthritis   . Bipolar disorder ([place])   . 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 Blood clot in vein   . Cancer ([place])    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  . Carotid arterial disease ([place])   . Contracture of joint of upper arm   . Elevated lipids   . GERD (gastroesophageal reflux disease)   . Hypertension   . Osteoporosis   . Poor balance   . Sinus congestion   . Stroke ([place])    x 2  . TIA (transient ischemic attack)     Past Surgical History:  Procedure Laterality Date  . APPENDECTOMY    . BREAST BIOPSY Left   . CATARACT EXTRACTION W/ INTRAOCULAR LENS  IMPLANT, BILATERAL Bilateral   . CHOLECYSTECTOMY    . femoral fx    . FOOT SURGERY    . KNEE SURGERY    . TONSILLECTOMY    . TOTAL KNEE ARTHROPLASTY Bilateral   . TOTAL SHOULDER ARTHROPLASTY Left 08/01/2016   Procedure: TOTAL SHOULDER ARTHROPLASTY;  Surgeon: Corky Mull, MD;  Location: [facility] ORS;  Service: Orthopedics;  Laterality: Left;    Family History  Problem Relation Age of Onset  . CAD Mother    Social  History:  reports that she has been smoking Cigarettes.  She has been smoking about 0.25 packs per day. She has never used smokeless tobacco. She reports that she does not drink alcohol or use drugs.  Allergies:  Allergies  Allergen Reactions  . Gabapentin     Dizzy and confusion  . Ketorolac Other (See Comments)  . Hydrocodone Hives and Rash    "terrible scratching"   . Percocet [Oxycodone-Acetaminophen] Itching and Rash  . Toradol [Ketorolac Tromethamine] Rash    Medications: I have reviewed the patient's current medications. Prior to Admission:  Prior to Admission medications   Medication Sig Start Date End Date Taking? Authorizing Provider  Ascorbic Acid (VITAMIN C WITH ROSE HIPS) 1000 MG tablet Take 1,000 mg by mouth 4 (four) times daily.    [provider]  aspirin EC 325 MG tablet Take 1 tablet (325 mg total) by mouth daily. 12/23/14   Gladstone Lighter, MD  atorvastatin (LIPITOR) 40 MG tablet Take 1 tablet (40 mg total) by mouth daily at 6 PM. 12/23/14   Gladstone Lighter, MD  CALCIUM-MAGNESIUM-ZINC PO Take 1 tablet by mouth 2 (two) times daily. For bone & muscle health.    [provider]  Carboxymethylcellulose Sodium (LUBRICANT EYE DROPS OP) Place 1 drop into both eyes daily.  [provider]  Cholecalciferol (VITAMIN D) 2000 units tablet Take 2,000 Units by mouth 2 (two) times daily.    [provider]  citalopram (CELEXA) 40 MG tablet Take 40 mg by mouth daily.    [provider]  diclofenac (VOLTAREN) 75 MG EC tablet Take 75 mg by mouth 2 (two) times daily.     [provider]  diphenhydrAMINE (BENADRYL) 25 MG tablet Take 25 mg by mouth every 6 (six) hours as needed for itching.    [provider]  fluconazole (DIFLUCAN) 150 MG tablet Take 150 mg by mouth daily.    [provider]  fluticasone (FLONASE) 50 MCG/ACT nasal spray Place 2 sprays into both nostrils daily as needed for allergies or rhinitis.     [provider]  hydrochlorothiazide (MICROZIDE) 12.5 MG capsule Take 12.5 mg by mouth daily.    [provider]  HYDROcodone-acetaminophen (NORCO/VICODIN) 5-325 MG tablet Take 1 tablet by mouth every 6 (six) hours as needed for moderate pain.    [provider]  HYDROmorphone (DILAUDID) 2 MG tablet Take 1-2 tablets (2-4 mg total) by mouth every 4 (four) hours as needed for severe pain. Patient not taking: Reported on 11/14/2016 08/02/16   Lattie Corns, PA-C  lamoTRIgine (LAMICTAL) 200 MG tablet Take 200 mg by mouth 2 (two) times daily.    [provider]  latanoprost (XALATAN) 0.005 % ophthalmic solution Place 2 drops into both eyes at bedtime.     [provider]  lisinopril (PRINIVIL,ZESTRIL) 40 MG tablet Take 40 mg by mouth daily.    [provider]  Magnesium 250 MG TABS Take 250 mg by mouth daily.     [provider]  meclizine (ANTIVERT) 25 MG tablet Take 1 tablet (25 mg total) by mouth 3 (three) times daily as needed for dizziness. 11/15/16   Drenda Freeze, MD  Melatonin 5 MG CAPS Take by mouth daily.    [provider]  meloxicam (MOBIC) 15 MG tablet Take 15 mg by mouth daily as needed for pain.     [provider]  Multiple Vitamin (MULTIVITAMIN WITH MINERALS) TABS tablet Take 1 tablet by mouth daily. One-A-Day Multivitamin for Women 50+    [provider]  omeprazole (PRILOSEC) 40 MG capsule Take 40 mg by mouth daily.    [provider]  ranitidine (ZANTAC) 150 MG tablet Take 150 mg by mouth daily as needed for heartburn.    [provider]     ROS: History obtained from the patient  General ROS: negative for - chills, fatigue, fever, night sweats, weight gain or weight loss Psychological ROS: negative for - behavioral disorder, hallucinations, memory difficulties, mood swings or suicidal ideation Ophthalmic ROS: negative for - blurry vision, double vision, eye pain  or loss of vision ENT ROS: negative for - epistaxis, nasal discharge, oral lesions, sore throat, tinnitus or vertigo Allergy and Immunology ROS: negative for - hives or itchy/watery eyes Hematological and Lymphatic ROS: negative for - bleeding problems, bruising or swollen lymph nodes Endocrine ROS: negative for - galactorrhea, hair pattern changes, polydipsia/polyuria or temperature intolerance Respiratory ROS: negative for - cough, hemoptysis, shortness of breath or wheezing Cardiovascular ROS: negative for - chest pain, dyspnea on exertion, edema or irregular heartbeat Gastrointestinal ROS: negative for - abdominal pain, diarrhea, hematemesis, nausea/vomiting or stool incontinence Genito-Urinary ROS: negative for - dysuria, hematuria, incontinence or urinary frequency/urgency Musculoskeletal ROS: left shoulder pain Neurological ROS: as noted in HPI Dermatological ROS:  negative for rash and skin lesion changes  Physical Examination: Blood pressure (!) 190/83, pulse (!) 54, temperature 98.5 F (36.9 C), temperature source Oral, resp. rate 18, height 5\' 2"  (1.575 m), weight 76.2 kg (168 lb), SpO2 98 %.  HEENT-  Normocephalic, no lesions, without obvious abnormality, no hematomas.  Normal external eye and conjunctiva.  Normal TM's bilaterally.  Normal auditory canals and external ears. Normal external nose, mucus membranes and septum.  Normal pharynx. Cardiovascular- S1, S2 normal, pulses palpable throughout   Lungs- chest clear, no wheezing, rales, normal symmetric air entry Abdomen- soft, non-tender; bowel sounds normal; no masses,  no organomegaly Extremities- no edema Lymph-no adenopathy palpable Musculoskeletal-no joint tenderness, deformity or swelling Skin-warm and dry, no hyperpigmentation, vitiligo, or suspicious lesions  Neurological Examination   Mental Status: Alert, oriented, thought content appropriate.  Speech fluent without evidence of aphasia.  Able to follow 3 step  commands without difficulty. Cranial Nerves: II: Discs flat bilaterally; Visual fields grossly normal, pupils equal, round, reactive to light and accommodation III,IV, VI: ptosis not present, extra-ocular motions intact bilaterally V,VII: smile symmetric, facial light touch sensation normal bilaterally with sustained nystagmus on right lateral gaze VIII: hearing normal bilaterally IX,X: gag reflex present XI: bilateral shoulder shrug XII: midline tongue extension Motor: Right : Upper extremity   5/5    Left:     Upper extremity   Decreased effort due to pain  Lower extremity   5/5     Lower extremity   5-/5 Tone and bulk:normal tone throughout; no atrophy noted Sensory: Pinprick and light touch intact throughout, bilaterally Deep Tendon Reflexes: 2+ and symmetric with absent KJ's bilateraelly Plantars: Right: mute   Left: mute Cerebellar: Normal finger-to-nose and normal heel-to-shin testing bilaterally Gait: gait resembles an astasia abasia     Laboratory Studies:  Basic Metabolic Panel:  Recent Labs Lab 11/15/16 1404  NA 137  K 3.4*  CL 103  CO2 27  GLUCOSE 109*  BUN 11  CREATININE 0.99  CALCIUM 9.4    Liver Function Tests:  Recent Labs Lab 11/15/16 1404  AST 19  ALT 11*  ALKPHOS 90  BILITOT 0.5  PROT 6.9  ALBUMIN 3.8   No results for input(s): LIPASE, AMYLASE in the last 168 hours. No results for input(s): AMMONIA in the last 168 hours.  CBC:  Recent Labs Lab 11/15/16 1404  WBC 10.0  NEUTROABS 6.9*  HGB 9.9*  HCT 31.0*  MCV 71.6*  PLT 500*    Cardiac Enzymes:  Recent Labs Lab 11/15/16 1404  TROPONINI <0.03    BNP: Invalid input(s): POCBNP  CBG: No results for input(s): GLUCAP in the last 168 hours.  Microbiology: Results for orders placed or performed during the hospital encounter of 08/01/16  Urine culture     Status: Abnormal   Collection Time: 08/01/16  6:23 AM  Result Value Ref Range Status   Specimen Description URINE,  RANDOM  Final   Special Requests NONE  Final   Culture MULTIPLE SPECIES PRESENT, SUGGEST RECOLLECTION (A)  Final   Report Status 08/02/2016 FINAL  Final    Coagulation Studies:  Recent Labs  11/15/16 1404  LABPROT 12.4  INR 0.92    Urinalysis: No results for input(s): COLORURINE, LABSPEC, PHURINE, GLUCOSEU, HGBUR, BILIRUBINUR, KETONESUR, PROTEINUR, UROBILINOGEN, NITRITE, LEUKOCYTESUR in the last 168 hours.  Invalid input(s): APPERANCEUR  Lipid Panel:    Component Value Date/Time   CHOL 236 (H) 12/23/2014 0456   TRIG 151 (H) 12/23/2014 0456   HDL  52 12/23/2014 0456   CHOLHDL 4.5 12/23/2014 0456   VLDL 30 12/23/2014 0456   LDLCALC 154 (H) 12/23/2014 0456    HgbA1C:  Lab Results  Component Value Date   HGBA1C 5.3 12/23/2014    Urine Drug Screen:  No results found for: LABOPIA, COCAINSCRNUR, LABBENZ, AMPHETMU, THCU, LABBARB  Alcohol Level:  Recent Labs Lab 11/15/16 1404  ETH <5    Imaging: Mr Brain Wo Contrast  Result Date: 11/15/2016 CLINICAL DATA:  Worsening dizziness over the last few days. Fell today. EXAM: MRI HEAD WITHOUT CONTRAST TECHNIQUE: Multiplanar, multiecho pulse sequences of the brain and surrounding structures were obtained without intravenous contrast. COMPARISON:  12/23/2014 FINDINGS: Brain: Diffusion imaging does not show any acute or subacute infarction. The brainstem and cerebellum are normal. Cerebral hemispheres show old infarctions in the right posterolateral temporal lobe and right parietal lobe consistent with old right MCA territory embolic infarctions. Few scattered foci of T2 and FLAIR signal in the hemispheric white matter. No mass lesion, hemorrhage, hydrocephalus or extra-axial collection. Vascular: No flow in the right internal carotid artery. Left internal carotid artery shows flow. Posterior circulation shows flow. Skull and upper cervical spine: Negative Sinuses/Orbits: Clear/normal Other: None IMPRESSION: No acute finding. No specific  cause of worsening dizziness identified. Old cortical and subcortical infarctions in the right posterior temporal lobe and right parietal lobe. No flow in the right internal carotid artery, a chronic finding. Electronically Signed   By: Nelson Chimes M.D.   On: 11/15/2016 15:07    Assessment: 66 y.o. female presenting with progressive worsening of lightheadedness.  Does not describe vertigo.  MRI of the brain reviewed and shows no acute changes.  Do not suspect acute infarct.  Patient on ASA.  Patient does have nystagmus.  Would rule out inner ear etiology.    Stroke Risk Factors - hyperlipidemia, hypertension and smoking  Plan: 1. Continue ASA 2. Meclizine prn 3. ENT evaluation 4. Smoking cessation counseling   Case discussed with Dr. Dene Gentry, MD Neurology [phone] 11/15/2016, 5:16 PM

## 2016-11-15 NOTE — Discharge Instructions (Signed)
Take meclizine as needed for dizziness.   Stay hydrated.   Home health will follow up with you   See your doctor  Return to ER if you have worse dizziness, passing out, vertigo, falling

## 2016-11-15 NOTE — Care Management Note (Signed)
Case Management Note  Patient Details  Name: Sydney Evans MRN: [id number] Date of Birth: [birth year]  Subjective/Objective: Contacted by MD to set up [facility] -PT, OT and aide, as well as request CSW for the patient , as the family are having a hard time caring for the patient. She has frequently been falling.   Referral has been completed to [place] by [facility]. He has accepted the referral and will contact the family. Dr Darl Householder is completing the face to face,  and the order at this time.Nurse for the patient has been notified referral is in, and company will contact the family for a time.                Action/Plan:   Expected Discharge Date:                  Expected Discharge Plan:     In-House Referral:     Discharge planning Services     Post Acute Care Choice:    Choice offered to:     DME Arranged:    DME Agency:     HH Arranged:    HH Agency:     Status of Service:     If discussed at [employer] of Stay Meetings, dates discussed:    Additional Comments:  Beau Fanny, RN 11/15/2016, 4:14 PM

## 2016-11-17 DIAGNOSIS — I6529 Occlusion and stenosis of unspecified carotid artery: Secondary | ICD-10-CM | POA: Insufficient documentation

## 2016-11-17 DIAGNOSIS — K219 Gastro-esophageal reflux disease without esophagitis: Secondary | ICD-10-CM | POA: Insufficient documentation

## 2016-11-17 DIAGNOSIS — I10 Essential (primary) hypertension: Secondary | ICD-10-CM | POA: Insufficient documentation

## 2016-11-17 DIAGNOSIS — E785 Hyperlipidemia, unspecified: Secondary | ICD-10-CM | POA: Insufficient documentation

## 2016-11-17 NOTE — Progress Notes (Signed)
MRN : [id number]  Sydney Evans is a 66 y.o. ([birth year]) female who presents with chief complaint of  Chief Complaint  Patient presents with  . Re-evaluation  .  History of Present Illness: The patient is seen for follow up evaluation of carotid stenosis. The carotid stenosis followed by ultrasound.   The patient denies amaurosis fugax. There is no recent history of TIA symptoms or focal motor deficits. There is no prior documented CVA.  The patient is taking enteric-coated aspirin 81 mg daily.  There is no history of migraine headaches. There is no history of seizures.  The patient has a history of coronary artery disease, no recent episodes of angina or shortness of breath. The patient denies PAD or claudication symptoms. There is a history of hyperlipidemia which is being treated with a statin.     Current Meds  Medication Sig  . aspirin EC 325 MG tablet Take 1 tablet (325 mg total) by mouth daily.  [name] atorvastatin (LIPITOR) 40 MG tablet Take 1 tablet (40 mg total) by mouth daily at 6 PM.  . CALCIUM-MAGNESIUM-ZINC PO Take 1 tablet by mouth 2 (two) times daily. For bone & muscle health.  . Cholecalciferol (VITAMIN D) 2000 units tablet Take 2,000 Units by mouth 2 (two) times daily.  . diphenhydrAMINE (BENADRYL) 25 MG tablet Take 25 mg by mouth every 6 (six) hours as needed for itching.  . fluticasone (FLONASE) 50 MCG/ACT nasal spray Place 2 sprays into both nostrils daily as needed for allergies or rhinitis.  . hydrochlorothiazide (MICROZIDE) 12.5 MG capsule Take 12.5 mg by mouth daily.  [name] lamoTRIgine (LAMICTAL) 200 MG tablet Take 200 mg by mouth 2 (two) times daily.  [name] latanoprost (XALATAN) 0.005 % ophthalmic solution Place 2 drops into both eyes at bedtime.   [name] lisinopril (PRINIVIL,ZESTRIL) 40 MG tablet Take 40 mg by mouth daily.  . Melatonin 5 MG CAPS Take by mouth daily.  . meloxicam (MOBIC) 15 MG tablet Take 15 mg by mouth daily as needed for pain.   . Multiple Vitamin  (MULTIVITAMIN WITH MINERALS) TABS tablet Take 1 tablet by mouth daily. One-A-Day Multivitamin for Women 50+  . omeprazole (PRILOSEC) 40 MG capsule Take 40 mg by mouth daily.    Past Medical History:  Diagnosis Date  . Arthritis   . Bipolar disorder ([place])   . 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 Blood clot in vein   . Cancer ([place])    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  . Carotid arterial disease ([place])   . Contracture of joint of upper arm   . Elevated lipids   . GERD (gastroesophageal reflux disease)   . Hypertension   . Osteoporosis   . Poor balance   . Sinus congestion   . Stroke ([place])    x 2  . TIA (transient ischemic attack)     Past Surgical History:  Procedure Laterality Date  . APPENDECTOMY    . BREAST BIOPSY Left   . CATARACT EXTRACTION W/ INTRAOCULAR LENS  IMPLANT, BILATERAL Bilateral   . CHOLECYSTECTOMY    . femoral fx    . FOOT SURGERY    . KNEE SURGERY    . TONSILLECTOMY    . TOTAL KNEE ARTHROPLASTY Bilateral   . TOTAL SHOULDER ARTHROPLASTY Left 08/01/2016   Procedure: TOTAL SHOULDER ARTHROPLASTY;  Surgeon: Corky Mull, MD;  Location: [facility] ORS;  Service: Orthopedics;  Laterality: Left;    Social History Social History  Substance Use Topics  .  Smoking status: Current Every Day Smoker    Packs/day: 0.25    Types: Cigarettes  . Smokeless tobacco: Never Used  . Alcohol use No    Family History Family History  Problem Relation Age of Onset  . CAD Mother     Allergies  Allergen Reactions  . Gabapentin     Dizzy and confusion  . Ketorolac Other (See Comments)  . Hydrocodone Hives and Rash    "terrible scratching"   . Percocet [Oxycodone-Acetaminophen] Itching and Rash  . Toradol [Ketorolac Tromethamine] Rash     REVIEW OF SYSTEMS (Negative unless checked)  Constitutional: [] Weight loss  [] Fever  [] Chills Cardiac: [] Chest pain   [] Chest pressure   [] Palpitations   [] Shortness of breath when laying flat   [] Shortness of breath with exertion. Vascular:  [] Pain in  legs with walking   [] Pain in legs at rest  [] History of DVT   [] Phlebitis   [] Swelling in legs   [] Varicose veins   [] Non-healing ulcers Pulmonary:   [] Uses home oxygen   [] Productive cough   [] Hemoptysis   [] Wheeze  [] COPD   [] Asthma Neurologic:  [] Dizziness   [] Seizures   [] History of stroke   [] History of TIA  [] Aphasia   [] Vissual changes   [] Weakness or numbness in arm   [] Weakness or numbness in leg Musculoskeletal:   [] Joint swelling   [] Joint pain   [] Low back pain Hematologic:  [] Easy bruising  [] Easy bleeding   [] Hypercoagulable state   [] Anemic Gastrointestinal:  [] Diarrhea   [] Vomiting  [] Gastroesophageal reflux/heartburn   [] Difficulty swallowing. Genitourinary:  [] Chronic kidney disease   [] Difficult urination  [] Frequent urination   [] Blood in urine Skin:  [] Rashes   [] Ulcers  Psychological:  [] History of anxiety   []  History of major depression.  Physical Examination  Vitals:   11/14/16 1634  BP: (!) 142/75  Pulse: (!) 56  Resp: 16  Weight: 168 lb (76.2 kg)   Body mass index is 29.76 kg/m. Gen: WD/WN, NAD Head: [place]/AT, No temporalis wasting.  Ear/Nose/Throat: Hearing grossly intact, nares w/o erythema or drainage Eyes: PER, EOMI, sclera nonicteric.  Neck: Supple, no large masses.   Pulmonary:  Good air movement, no audible wheezing bilaterally, no use of accessory muscles.  Cardiac: RRR, no JVD Vascular: bilateral carotid bruits Vessel Right Left  Radial Palpable Palpable  Brachial Palpable Palpable  Carotid Palpable Palpable  Gastrointestinal: Non-distended. No guarding/no peritoneal signs.  Musculoskeletal: M/S 5/5 throughout.  No deformity or atrophy.  Neurologic: CN 2-12 intact. Symmetrical.  Speech is fluent. Motor exam as listed above. Psychiatric: Judgment intact, Mood & affect appropriate for pt's clinical situation. Dermatologic: No rashes or ulcers noted.  No changes consistent with cellulitis. Lymph : No lichenification or skin changes of chronic  lymphedema.  CBC Lab Results  Component Value Date   WBC 10.0 11/15/2016   HGB 9.9 (L) 11/15/2016   HCT 31.0 (L) 11/15/2016   MCV 71.6 (L) 11/15/2016   PLT 500 (H) 11/15/2016    BMET    Component Value Date/Time   NA 137 11/15/2016 1404   K 3.4 (L) 11/15/2016 1404   CL 103 11/15/2016 1404   CO2 27 11/15/2016 1404   GLUCOSE 109 (H) 11/15/2016 1404   BUN 11 11/15/2016 1404   CREATININE 0.99 11/15/2016 1404   CALCIUM 9.4 11/15/2016 1404   GFRNONAA 59 (L) 11/15/2016 1404   GFRAA >60 11/15/2016 1404   Estimated Creatinine Clearance: 54.8 mL/min (by C-G formula based on SCr of 0.99 mg/dL).  COAG Lab Results  Component Value Date   INR 0.92 11/15/2016   INR 0.91 06/26/2016   INR 0.89 12/22/2014    Radiology Mr Brain Wo Contrast  Result Date: 11/15/2016 CLINICAL DATA:  Worsening dizziness over the last few days. Fell today. EXAM: MRI HEAD WITHOUT CONTRAST TECHNIQUE: Multiplanar, multiecho pulse sequences of the brain and surrounding structures were obtained without intravenous contrast. COMPARISON:  12/23/2014 FINDINGS: Brain: Diffusion imaging does not show any acute or subacute infarction. The brainstem and cerebellum are normal. Cerebral hemispheres show old infarctions in the right posterolateral temporal lobe and right parietal lobe consistent with old right MCA territory embolic infarctions. Few scattered foci of T2 and FLAIR signal in the hemispheric white matter. No mass lesion, hemorrhage, hydrocephalus or extra-axial collection. Vascular: No flow in the right internal carotid artery. Left internal carotid artery shows flow. Posterior circulation shows flow. Skull and upper cervical spine: Negative Sinuses/Orbits: Clear/normal Other: None IMPRESSION: No acute finding. No specific cause of worsening dizziness identified. Old cortical and subcortical infarctions in the right posterior temporal lobe and right parietal lobe. No flow in the right internal carotid artery, a chronic  finding. Electronically Signed   By: Nelson Chimes M.D.   On: 11/15/2016 15:07   [place] Limited Joint Space Structures Up Left  Result Date: 11/01/2016 CLINICAL DATA:  Status post shoulder replacement. EXAM: ULTRASOUND LEFT UPPER EXTREMITY LIMITED TECHNIQUE: Ultrasound examination of the upper extremity soft tissues was performed in the area of clinical concern. COMPARISON:  None FINDINGS: Real-time sonography of the left shoulder was performed with a high-frequency linear transducer. Proximal extra-articular portion of the long head of the biceps tendon is severely thickened and hypoechoic concerning for severe tendinosis. No fluid in the long head of the biceps tendon sheath. Supraspinatus tendon is intact. Infraspinatus tendon is intact. Teres minor tendon is intact. Subscapularis tendon is intact. No muscle atrophy. No subacromial/subdeltoid bursal fluid. Mild arthropathy of the acromioclavicular joint. No significant joint effusion.  Left shoulder arthroplasty is noted. IMPRESSION: 1. 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 2. Rotator cuff of the left shoulder is intact. Electronically Signed   By: Kathreen Devoid   On: 11/01/2016 12:32     Assessment/Plan 1. Bilateral carotid artery stenosis Recommend:  Given the patient's asymptomatic subcritical stenosis no further invasive testing or surgery at this time.  Duplex ultrasound shows <50% stenosis bilaterally.  Continue antiplatelet therapy as prescribed Continue management of CAD, HTN and Hyperlipidemia Healthy heart diet,  encouraged exercise at least 4 times per week Follow up in 12 months with duplex ultrasound and physical exam   - VAS US CAROTID; Future  2. Gastroesophageal reflux disease without esophagitis Continue PPI as already ordered, these medications have been reviewed and there are no changes at this time.   3. Essential hypertension Continue antihypertensive medications  as already ordered, these medications have been reviewed and there are no changes at this time.   4. Mixed hyperlipidemia Continue statin as ordered and reviewed, no changes at this time     Hortencia Pilar, MD  11/17/2016 7:14 PM

## 2016-11-29 NOTE — Progress Notes (Signed)
[facility]  Patient Status: not taken under care, staffing coordinator with [facility] contacted patient and she was unsure about [facility] services and requested call back to let her think about services. Since then unable to contact patient. Notified Marshell Garfinkel, CM.    Sydney Evans 11/29/2016, 9:19 AM

## 2016-12-23 ENCOUNTER — Other Ambulatory Visit: Payer: Self-pay | Admitting: Family Medicine

## 2016-12-23 ENCOUNTER — Ambulatory Visit
Admission: RE | Admit: 2016-12-23 | Discharge: 2016-12-23 | Disposition: A | Discharge status: Home / Self Care | Payer: Medicare Other | Source: Ambulatory Visit | Attending: Family Medicine | Admitting: Family Medicine

## 2016-12-23 DIAGNOSIS — M79671 Pain in right foot: Secondary | ICD-10-CM | POA: Insufficient documentation

## 2017-01-20 ENCOUNTER — Ambulatory Visit: Payer: Medicare Other | Admitting: Cardiovascular Disease

## 2017-02-11 ENCOUNTER — Other Ambulatory Visit: Payer: Self-pay | Admitting: Orthopedic Surgery

## 2017-02-11 DIAGNOSIS — M5412 Radiculopathy, cervical region: Secondary | ICD-10-CM

## 2017-02-15 ENCOUNTER — Emergency Department
Admission: EM | Admit: 2017-02-15 | Discharge: 2017-02-15 | Disposition: A | Discharge status: Home / Self Care | Payer: Medicare Other | Attending: Emergency Medicine | Admitting: Emergency Medicine

## 2017-02-15 ENCOUNTER — Emergency Department: Payer: Medicare Other

## 2017-02-15 DIAGNOSIS — R42 Dizziness and giddiness: Secondary | ICD-10-CM

## 2017-02-15 DIAGNOSIS — I2510 Atherosclerotic heart disease of native coronary artery without angina pectoris: Secondary | ICD-10-CM | POA: Insufficient documentation

## 2017-02-15 DIAGNOSIS — F17210 Nicotine dependence, cigarettes, uncomplicated: Secondary | ICD-10-CM | POA: Diagnosis not present

## 2017-02-15 DIAGNOSIS — Z79899 Other long term (current) drug therapy: Secondary | ICD-10-CM | POA: Insufficient documentation

## 2017-02-15 DIAGNOSIS — R112 Nausea with vomiting, unspecified: Secondary | ICD-10-CM | POA: Insufficient documentation

## 2017-02-15 DIAGNOSIS — I10 Essential (primary) hypertension: Secondary | ICD-10-CM | POA: Insufficient documentation

## 2017-02-15 LAB — URINALYSIS, COMPLETE (UACMP) WITH MICROSCOPIC
Bacteria, UA: NONE SEEN
Bilirubin Urine: NEGATIVE
Glucose, UA: NEGATIVE mg/dL
Hgb urine dipstick: NEGATIVE
KETONES UR: NEGATIVE mg/dL
Leukocytes, UA: NEGATIVE
Nitrite: NEGATIVE
PH: 6 (ref 5.0–8.0)
Protein, ur: NEGATIVE mg/dL
SPECIFIC GRAVITY, URINE: 1.011 (ref 1.005–1.030)

## 2017-02-15 LAB — CBC
HCT: 31.9 % — ABNORMAL LOW (ref 35.0–47.0)
Hemoglobin: 10.3 g/dL — ABNORMAL LOW (ref 12.0–16.0)
MCH: 23.3 pg — AB (ref 26.0–34.0)
MCHC: 32.1 g/dL (ref 32.0–36.0)
MCV: 72.5 fL — AB (ref 80.0–100.0)
PLATELETS: 440 10*3/uL (ref 150–440)
RBC: 4.4 MIL/uL (ref 3.80–5.20)
RDW: 18.2 % — AB (ref 11.5–14.5)
WBC: 10.3 10*3/uL (ref 3.6–11.0)

## 2017-02-15 LAB — BASIC METABOLIC PANEL
Anion gap: 10 (ref 5–15)
BUN: 10 mg/dL (ref 6–20)
CALCIUM: 8.9 mg/dL (ref 8.9–10.3)
CHLORIDE: 102 mmol/L (ref 101–111)
CO2: 23 mmol/L (ref 22–32)
CREATININE: 0.89 mg/dL (ref 0.44–1.00)
GFR calc Af Amer: >60 mL/min (ref >60)
GFR calc non Af Amer: >60 mL/min (ref >60)
GLUCOSE: 133 mg/dL — AB (ref 65–99)
Potassium: 3.4 mmol/L — ABNORMAL LOW (ref 3.5–5.1)
Sodium: 135 mmol/L (ref 135–145)

## 2017-02-15 LAB — TROPONIN I

## 2017-02-15 MED ORDER — ONDANSETRON HCL 4 MG/2ML IJ SOLN
4.0000 mg | Freq: Once | INTRAMUSCULAR | Status: AC
  Administered 2017-02-15: 4 mg via INTRAVENOUS
  Filled 2017-02-15: qty 2

## 2017-02-15 NOTE — ED Provider Notes (Addendum)
[facility] Emergency Department Provider Note  ____________________________________________   First MD Initiated Contact with Patient 02/15/17 1303     (approximate)  I have reviewed the triage vital signs and the nursing notes.   HISTORY  Chief Complaint Dizziness and Fall   HPI Sydney Evans is a 66 y.o. female With a history of bipolar disorder as well as vertiginous episodes that occur 3-4 times per month who is presenting to the emergency department today with dizziness, nausea vomiting and a fall. The patient says that she was standing when she began to have these symptoms. She fell backwards, hitting her head and back. She reports hitting her head against a log. Denies any loss of consciousness. Says that she has no longer dizzy but still feels mildly nauseous. She has had multiple episodes of vomiting in route. Also complaining of some mild to moderate low back pain which she says is also a chronic issue. Denies any ear pressure or ringing in her ears. Says that she has meclizine that she is occasionally at home with minimal relief. Says that she has similar episodes 3-4 times per month. However, this episode was different because she is now having vomiting.   Past Medical History:  Diagnosis Date  . Arthritis   . Bipolar disorder ([place])   . 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 Blood clot in vein   . Cancer ([place])    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  . Carotid arterial disease ([place])   . Contracture of joint of upper arm   . Elevated lipids   . GERD (gastroesophageal reflux disease)   . Hypertension   . Osteoporosis   . Poor balance   . Sinus congestion   . 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 TIA (transient ischemic attack)     Patient Active Problem List   Diagnosis Date Noted  . Carotid stenosis 11/17/2016  . GERD (gastroesophageal reflux disease) 11/17/2016  . Essential hypertension 11/17/2016  . Hyperlipidemia 11/17/2016  . 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 TIA (transient ischemic attack) 12/23/2014    Past Surgical History:  Procedure Laterality Date  . APPENDECTOMY    . BREAST BIOPSY Left   . CATARACT EXTRACTION W/ INTRAOCULAR LENS  IMPLANT, BILATERAL Bilateral   . CHOLECYSTECTOMY    . femoral fx    . FOOT SURGERY    . KNEE SURGERY    . TONSILLECTOMY    . TOTAL KNEE ARTHROPLASTY Bilateral   . TOTAL SHOULDER ARTHROPLASTY Left 08/01/2016   Procedure: TOTAL SHOULDER ARTHROPLASTY;  Surgeon: Corky Mull, MD;  Location: [facility] ORS;  Service: Orthopedics;  Laterality: Left;    Prior to Admission medications   Medication Sig Start Date End Date Taking? Authorizing Provider  Ascorbic Acid (VITAMIN C WITH ROSE HIPS) 1000 MG tablet Take 1,000 mg by mouth 4 (four) times daily.    [provider]  aspirin EC 325 MG tablet Take 1 tablet (325 mg total) by mouth daily. 12/23/14   Gladstone Lighter, MD  atorvastatin (LIPITOR) 40 MG tablet Take 1 tablet (40 mg total) by mouth daily at 6 PM. 12/23/14   Gladstone Lighter, MD  CALCIUM-MAGNESIUM-ZINC PO Take 1 tablet by mouth 2 (two) times daily. For bone & muscle health.    [provider]  Carboxymethylcellulose Sodium (LUBRICANT EYE DROPS OP) Place 1 drop into both eyes daily.    [provider]  Cholecalciferol (VITAMIN D) 2000 units tablet Take  2,000 Units by mouth 2 (two) times daily.    [provider]  citalopram (CELEXA) 40 MG tablet Take 40 mg by mouth daily.    [provider]  diclofenac (VOLTAREN) 75 MG EC tablet Take 75 mg by mouth 2 (two) times daily.     [provider]  diphenhydrAMINE (BENADRYL) 25 MG tablet Take 25 mg by mouth every 6 (six) hours as needed for itching.    [provider]  fluconazole (DIFLUCAN) 150 MG tablet Take 150 mg by mouth daily.    [provider]  fluticasone (FLONASE) 50 MCG/ACT nasal spray Place 2 sprays into both nostrils daily as needed for allergies or rhinitis.     [provider]  hydrochlorothiazide (MICROZIDE) 12.5 MG capsule Take 12.5 mg by mouth daily.    [provider]  HYDROcodone-acetaminophen (NORCO/VICODIN) 5-325 MG tablet Take 1 tablet by mouth every 6 (six) hours as needed for moderate pain.    [provider]  HYDROmorphone (DILAUDID) 2 MG tablet Take 1-2 tablets (2-4 mg total) by mouth every 4 (four) hours as needed for severe pain. Patient not taking: Reported on 11/14/2016 08/02/16   Lattie Corns, PA-C  lamoTRIgine (LAMICTAL) 200 MG tablet Take 200 mg by mouth 2 (two) times daily.    [provider]  latanoprost (XALATAN) 0.005 % ophthalmic solution Place 2 drops into both eyes at bedtime.     [provider]  lisinopril (PRINIVIL,ZESTRIL) 40 MG tablet Take 40 mg by mouth daily.    [provider]  Magnesium 250 MG TABS Take 250 mg by mouth daily.     [provider]  meclizine (ANTIVERT) 25 MG tablet Take 1 tablet (25 mg total) by mouth 3 (three) times daily as needed for dizziness. 11/15/16   Drenda Freeze, MD  Melatonin 5 MG CAPS Take by mouth daily.    [provider]  meloxicam (MOBIC) 15 MG tablet Take 15 mg by mouth daily as needed for pain.     [provider]  Multiple Vitamin (MULTIVITAMIN WITH MINERALS) TABS tablet Take 1 tablet by mouth daily. One-A-Day Multivitamin for Women 50+    [provider]  omeprazole (PRILOSEC) 40 MG capsule Take 40 mg by mouth daily.    [provider]  ranitidine (ZANTAC) 150 MG tablet Take 150 mg by mouth daily as needed for heartburn.    [provider]    Allergies Gabapentin; Ketorolac; Hydrocodone; Percocet [oxycodone-acetaminophen]; and Toradol [ketorolac tromethamine]  Family History  Problem Relation Age of Onset  . CAD Mother     Social History Social History  Substance Use Topics  . Smoking status: Current Every Day Smoker    Packs/day: 0.25    Types: Cigarettes  .  Smokeless tobacco: Never Used  . Alcohol use No    Review of Systems  Constitutional: No fever/chills Eyes: No visual changes. ENT: No sore throat. Cardiovascular: Denies chest pain. Respiratory: Denies shortness of breath. Gastrointestinal: No abdominal pain.   No diarrhea.  No constipation. Genitourinary: Negative for dysuria. Musculoskeletal: as above Skin: Negative for rash. Neurological: Negative for headaches, focal weakness or numbness.   ____________________________________________   PHYSICAL EXAM:  VITAL SIGNS: ED Triage Vitals  Enc Vitals Group     BP 02/15/17 1308 (!) 175/74     Pulse Rate 02/15/17 1308 60     Resp 02/15/17 1308 18     Temp 02/15/17 1308 97.9 F (36.6 C)     Temp  Source 02/15/17 1308 Oral     SpO2 02/15/17 1308 97 %     Weight 02/15/17 1310 166 lb (75.3 kg)     Height 02/15/17 1310 5\' 2"  (1.575 m)     Head Circumference --      Peak Flow --      Pain Score --      Pain Loc --      Pain Edu? --      Excl. in [place]? --     Constitutional: Alert and oriented. Well appearing and in no acute distress. Eyes: Conjunctivae are normal. PERRLA. No nystagmus. Head: Atraumatic.normal TMs bilaterally. Nose: No congestion/rhinnorhea. Mouth/Throat: Mucous membranes are moist.  Neck: No stridor.   Cardiovascular: Normal rate, regular rhythm. Grossly normal heart sounds.   Respiratory: Normal respiratory effort.  No retractions. Lungs CTAB. Gastrointestinal: Soft and nontender. No distention. No CVA tenderness. Musculoskeletal: No lower extremity tenderness nor edema.  No joint effusions.5 out of 5 strength to bilateral lower extremity is pretty there is no tenderness palpation over the lumbar spine at the midline or laterally. No ecchymosis. Neurologic:  Normal speech and language. No gross focal neurologic deficits are appreciated.no ataxia on finger to nose testing. No nystagmus. Skin:  Skin is warm, dry and intact. No rash noted. Psychiatric: Mood  and affect are normal. Speech and behavior are normal.  ____________________________________________   LABS (all labs ordered are listed, but only abnormal results are displayed)  Labs Reviewed  CBC - Abnormal; Notable for the following:       Result Value   Hemoglobin 10.3 (*)    HCT 31.9 (*)    MCV 72.5 (*)    MCH 23.3 (*)    RDW 18.2 (*)    All other components within normal limits  BASIC METABOLIC PANEL  URINALYSIS, COMPLETE (UACMP) WITH MICROSCOPIC  CBG MONITORING, ED   ____________________________________________  EKG  ED ECG REPORT I, Doran Stabler, the attending physician, personally viewed and interpreted this ECG.   Date: 02/15/2017  EKG Time: 1316  Rate: 55  Rhythm: normal sinus rhythm  Axis: normal  Intervals:none  ST&T Change: no ST segment elevation or depression. No abnormal T-wave inversion.  ____________________________________________  RADIOLOGY  no acute abnormality on head CT. Degenerative disc disease on the spinal x-ray without acute normality. ____________________________________________   PROCEDURES  Procedure(s) performed: None  Procedures  Critical Care performed: No  ____________________________________________   INITIAL IMPRESSION / ASSESSMENT AND PLAN / ED COURSE  Pertinent labs & imaging results that were available during my care of the patient were reviewed by me and considered in my medical decision making (see chart for details).  DDX: Vertigo, nausea and vomiting, CVA  As part of my medical decision making, I reviewed the following data within the [place] Notes from prior ED visits  ----------------------------------------- 3:17 PM on 02/15/2017 -----------------------------------------  Patient at this timewithout any complaints. Tolerating by mouth fluids. Patient also on appointment yesterday with ENT. Has appointment scheduled in November with neurology. Has also seen cardiology. Patient  without acute appearing pathology today. Daughter also reports that these episodes happen when standing up and that the patient had still strayed from a bleeding for position earlier today just before the episode began. Possible orthostatic issue. Blood pressure remained slightly elevated here in the 160F systolic at this time. pending urinalysis. patient without any neurologic findings upon arrival and continues to have a baseline neurologic exam without any focal deficits.  Signed out to  Dr. Burlene Arnt.      ____________________________________________   FINAL CLINICAL IMPRESSION(S) / ED DIAGNOSES  nausea and vomiting. Vertigo.    NEW MEDICATIONS STARTED DURING THIS VISIT:  New Prescriptions   No medications on file     Note:  This document was prepared using Dragon voice recognition software and may include unintentional dictation errors.     Orbie Pyo, MD 02/15/17 [place], Randall An, MD 02/15/17 [phone]

## 2017-02-15 NOTE — ED Triage Notes (Signed)
Pt came to ED via EMS.. Reports fall after dizziness, reports dizziness is normal for her. Also c/o nausea and 1 episode of vomiting. Vs stale at this time

## 2017-02-18 ENCOUNTER — Ambulatory Visit
Admission: RE | Admit: 2017-02-18 | Discharge: 2017-02-18 | Disposition: A | Discharge status: Home / Self Care | Payer: Medicare Other | Source: Ambulatory Visit | Attending: Orthopedic Surgery | Admitting: Orthopedic Surgery

## 2017-02-18 DIAGNOSIS — M50121 Cervical disc disorder at C4-C5 level with radiculopathy: Secondary | ICD-10-CM | POA: Diagnosis not present

## 2017-02-18 DIAGNOSIS — M5412 Radiculopathy, cervical region: Secondary | ICD-10-CM | POA: Diagnosis present

## 2017-02-18 DIAGNOSIS — M4722 Other spondylosis with radiculopathy, cervical region: Secondary | ICD-10-CM | POA: Insufficient documentation

## 2017-05-30 ENCOUNTER — Emergency Department
Admission: EM | Admit: 2017-05-30 | Discharge: 2017-05-30 | Disposition: A | Discharge status: Home / Self Care | Payer: Medicare Other | Attending: Emergency Medicine | Admitting: Emergency Medicine

## 2017-05-30 ENCOUNTER — Emergency Department: Payer: Medicare Other

## 2017-05-30 ENCOUNTER — Encounter: Payer: Self-pay | Admitting: Emergency Medicine

## 2017-05-30 DIAGNOSIS — Z8673 Personal history of transient ischemic attack (TIA), and cerebral infarction without residual deficits: Secondary | ICD-10-CM | POA: Diagnosis not present

## 2017-05-30 DIAGNOSIS — R51 Headache: Secondary | ICD-10-CM | POA: Insufficient documentation

## 2017-05-30 DIAGNOSIS — F17210 Nicotine dependence, cigarettes, uncomplicated: Secondary | ICD-10-CM | POA: Insufficient documentation

## 2017-05-30 DIAGNOSIS — I10 Essential (primary) hypertension: Secondary | ICD-10-CM | POA: Insufficient documentation

## 2017-05-30 DIAGNOSIS — S93601A Unspecified sprain of right foot, initial encounter: Secondary | ICD-10-CM | POA: Insufficient documentation

## 2017-05-30 DIAGNOSIS — Z9989 Dependence on other enabling machines and devices: Secondary | ICD-10-CM | POA: Diagnosis not present

## 2017-05-30 DIAGNOSIS — W01198A Fall on same level from slipping, tripping and stumbling with subsequent striking against other object, initial encounter: Secondary | ICD-10-CM | POA: Diagnosis not present

## 2017-05-30 DIAGNOSIS — S0990XA Unspecified injury of head, initial encounter: Secondary | ICD-10-CM

## 2017-05-30 DIAGNOSIS — Z79899 Other long term (current) drug therapy: Secondary | ICD-10-CM | POA: Diagnosis not present

## 2017-05-30 DIAGNOSIS — I2510 Atherosclerotic heart disease of native coronary artery without angina pectoris: Secondary | ICD-10-CM | POA: Diagnosis not present

## 2017-05-30 DIAGNOSIS — Y998 Other external cause status: Secondary | ICD-10-CM | POA: Insufficient documentation

## 2017-05-30 DIAGNOSIS — Y929 Unspecified place or not applicable: Secondary | ICD-10-CM | POA: Diagnosis not present

## 2017-05-30 DIAGNOSIS — Y939 Activity, unspecified: Secondary | ICD-10-CM | POA: Diagnosis not present

## 2017-05-30 DIAGNOSIS — Z7982 Long term (current) use of aspirin: Secondary | ICD-10-CM | POA: Diagnosis not present

## 2017-05-30 NOTE — ED Triage Notes (Signed)
Pt in via [facility] from home; reports falling this morning, injuring left ankle.  Pt with difficulty bearing weight due to pain.  Pt A/Ox4, vitals WDL, NAD noted at this time.  Pt with service dog at bedside.

## 2017-05-30 NOTE — ED Notes (Signed)
Patient transported to CT 

## 2017-05-30 NOTE — ED Notes (Signed)
Ace wrap applied to left foot.

## 2017-05-30 NOTE — ED Provider Notes (Signed)
[facility] Emergency Department Provider Note  ____________________________________________   First MD Initiated Contact with Patient 05/30/17 1923     (approximate)  I have reviewed the triage vital signs and the nursing notes.   HISTORY  Chief Complaint Fall and Ankle Pain   HPI Sydney Evans is a 67 y.o. female with a history of amatory dysfunction, stroke who walks with a walker who is presenting to the emergency department with 2 falls this morning.  She says that she fell backward hitting her head against a heater.  She is not reporting any loss of consciousness.  However, she is reporting mild and diffuse headache that is been constant since she had a concussion several months ago.  She is also reporting pain to her left foot over the medial most metatarsal where she says she moved the foot awkwardly while getting into her car this morning.  She says that she has been able to ambulate but with only putting weight on the heel.    Past Medical History:  Diagnosis Date  . Arthritis   . Bipolar disorder ([place])   . 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 Blood clot in vein   . Cancer ([place])    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  . Carotid arterial disease ([place])   . Contracture of joint of upper arm   . Elevated lipids   . GERD (gastroesophageal reflux disease)   . Hypertension   . Osteoporosis   . Poor balance   . Sinus congestion   . 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 TIA (transient ischemic attack)     Patient Active Problem List   Diagnosis Date Noted  . Carotid stenosis 11/17/2016  . GERD (gastroesophageal reflux disease) 11/17/2016  . Essential hypertension 11/17/2016  . Hyperlipidemia 11/17/2016  . 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 TIA (transient ischemic attack) 12/23/2014    Past Surgical History:  Procedure Laterality Date  . APPENDECTOMY    . BREAST BIOPSY Left   . CATARACT EXTRACTION W/ INTRAOCULAR LENS  IMPLANT, BILATERAL Bilateral     . CHOLECYSTECTOMY    . femoral fx    . FOOT SURGERY    . KNEE SURGERY    . TONSILLECTOMY    . TOTAL KNEE ARTHROPLASTY Bilateral   . TOTAL SHOULDER ARTHROPLASTY Left 08/01/2016   Procedure: TOTAL SHOULDER ARTHROPLASTY;  Surgeon: Corky Mull, MD;  Location: [facility] ORS;  Service: Orthopedics;  Laterality: Left;    Prior to Admission medications   Medication Sig Start Date End Date Taking? Authorizing Provider  Ascorbic Acid (VITAMIN C WITH ROSE HIPS) 1000 MG tablet Take 1,000 mg by mouth 4 (four) times daily.    [provider]  aspirin EC 325 MG tablet Take 1 tablet (325 mg total) by mouth daily. 12/23/14   Gladstone Lighter, MD  atorvastatin (LIPITOR) 40 MG tablet Take 1 tablet (40 mg total) by mouth daily at 6 PM. 12/23/14   Gladstone Lighter, MD  CALCIUM-MAGNESIUM-ZINC PO Take 1 tablet by mouth 2 (two) times daily. For bone & muscle health.    [provider]  Carboxymethylcellulose Sodium (LUBRICANT EYE DROPS OP) Place 1 drop into both eyes daily.    [provider]  Cholecalciferol (VITAMIN D) 2000 units tablet Take 2,000 Units by mouth 2 (two) times daily.    [provider]  citalopram (CELEXA) 40 MG tablet Take 40 mg by mouth daily.    [provider]  diclofenac (VOLTAREN) 75 MG EC tablet Take 75 mg by mouth 2 (two) times daily.     [provider]  diphenhydrAMINE (BENADRYL) 25 MG tablet Take 25 mg by mouth every 6 (six) hours as needed for itching.    [provider]  fluconazole (DIFLUCAN) 150 MG tablet Take 150 mg by mouth daily.    [provider]  fluticasone (FLONASE) 50 MCG/ACT nasal spray Place 2 sprays into both nostrils daily as needed for allergies or rhinitis.    [provider]  hydrochlorothiazide (MICROZIDE) 12.5 MG capsule Take 12.5 mg by mouth daily.    [provider]  HYDROcodone-acetaminophen (NORCO/VICODIN) 5-325 MG tablet Take 1 tablet by mouth every 6 (six) hours as needed  for moderate pain.    [provider]  HYDROmorphone (DILAUDID) 2 MG tablet Take 1-2 tablets (2-4 mg total) by mouth every 4 (four) hours as needed for severe pain. Patient not taking: Reported on 11/14/2016 08/02/16   Lattie Corns, PA-C  lamoTRIgine (LAMICTAL) 200 MG tablet Take 200 mg by mouth 2 (two) times daily.    [provider]  latanoprost (XALATAN) 0.005 % ophthalmic solution Place 2 drops into both eyes at bedtime.     [provider]  lisinopril (PRINIVIL,ZESTRIL) 40 MG tablet Take 40 mg by mouth daily.    [provider]  Magnesium 250 MG TABS Take 250 mg by mouth daily.     [provider]  meclizine (ANTIVERT) 25 MG tablet Take 1 tablet (25 mg total) by mouth 3 (three) times daily as needed for dizziness. 11/15/16   Drenda Freeze, MD  Melatonin 5 MG CAPS Take by mouth daily.    [provider]  meloxicam (MOBIC) 15 MG tablet Take 15 mg by mouth daily as needed for pain.     [provider]  Multiple Vitamin (MULTIVITAMIN WITH MINERALS) TABS tablet Take 1 tablet by mouth daily. One-A-Day Multivitamin for Women 50+    [provider]  omeprazole (PRILOSEC) 40 MG capsule Take 40 mg by mouth daily.    [provider]  ranitidine (ZANTAC) 150 MG tablet Take 150 mg by mouth daily as needed for heartburn.    [provider]    Allergies Gabapentin; Ketorolac; Hydrocodone; Percocet [oxycodone-acetaminophen]; and Toradol [ketorolac tromethamine]  Family History  Problem Relation Age of Onset  . CAD Mother     Social History Social History   Tobacco Use  . Smoking status: Current Every Day Smoker    Packs/day: 0.25    Types: Cigarettes  . Smokeless tobacco: Never Used  Substance Use Topics  . Alcohol use: No  . Drug use: No    Review of Systems  Constitutional: No fever/chills Eyes: No visual changes. ENT: No sore throat. Cardiovascular: Denies chest pain. Respiratory:  Denies shortness of breath. Gastrointestinal: No abdominal pain.  No nausea, no vomiting.  No diarrhea.  No constipation. Genitourinary: Negative for dysuria. Musculoskeletal: Negative for back pain. Skin: Negative for rash. Neurological: Negative for headaches, focal weakness or numbness.   ____________________________________________   PHYSICAL EXAM:  VITAL SIGNS: ED Triage Vitals  Enc Vitals Group     BP 05/30/17 1932 (!) 168/80     Pulse Rate 05/30/17 1932 70     Resp 05/30/17 1932 16     Temp 05/30/17 1932 98.2 F (36.8 C)     Temp Source 05/30/17 1932 Oral     SpO2 05/30/17 1932 98 %     Weight  05/30/17 1933 165 lb (74.8 kg)     Height 05/30/17 1933 5\' 2"  (1.575 m)     Head Circumference --      Peak Flow --      Pain Score 05/30/17 1933 8     Pain Loc --      Pain Edu? --      Excl. in [place]? --     Constitutional: Alert and oriented. Well appearing and in no acute distress. Eyes: Conjunctivae are normal.  Head: Atraumatic. Nose: No congestion/rhinnorhea. Mouth/Throat: Mucous membranes are moist.  Neck: No stridor.  No tenderness to palpation.  No deformity or step-off. Cardiovascular: Normal rate, regular rhythm. Grossly normal heart sounds.  Good peripheral circulation with dorsalis pedis pulse palpated on the dorsum of the left foot  respiratory: Normal respiratory effort.  No retractions. Lungs CTAB. Gastrointestinal: Soft and nontender. No distention.  Musculoskeletal: Tenderness to palpation over the left foot first metatarsal on the dorsum of the foot.  No tenderness to the medial or lateral malleoli.  There is no swelling, no deformity to the foot or ankle.  Patient is sensate and able to move her toes. Neurologic:  Normal speech and language. No gross focal neurologic deficits are appreciated. Skin:  Skin is warm, dry and intact. No rash noted. Psychiatric: Mood and affect are normal. Speech and behavior are  normal.  ____________________________________________   LABS (all labs ordered are listed, but only abnormal results are displayed)  Labs Reviewed - No data to display ____________________________________________  EKG   ____________________________________________  RADIOLOGY  No acute findings on the head CT.  Arthritis on the fracture. ____________________________________________   PROCEDURES  Procedure(s) performed:   Procedures  Critical Care performed:   ____________________________________________   INITIAL IMPRESSION / ASSESSMENT AND PLAN / ED COURSE  Pertinent labs & imaging results that were available during my care of the patient were reviewed by me and considered in my medical decision making (see chart for details).  DDX: Foot fracture, foot contusion, ligamentous injury, ankle fracture, ankle sprain, skull fracture, intercranial hemorrhage, concussion As part of my medical decision making, I reviewed the following data within the electronic MEDICAL RECORD NUMBER Notes from prior ED visits     ----------------------------------------- 9:11 PM on 05/30/2017 -----------------------------------------  Patient at this time without any acute distress.  Likely head injury with foot sprain to the right side.  Will Ace wrap.  Patient will be discharged.  I updated the patient is to the imaging results.  She is understanding and willing to comply.  We discussed rice care.  ____________________________________________   FINAL CLINICAL IMPRESSION(S) / ED DIAGNOSES  Foot sprain.  Head injury.    NEW MEDICATIONS STARTED DURING THIS VISIT:  New Prescriptions   No medications on file     Note:  This document was prepared using Dragon voice recognition software and may include unintentional dictation errors.     Orbie Pyo, MD 05/30/17 2112

## 2017-10-03 ENCOUNTER — Encounter (INDEPENDENT_AMBULATORY_CARE_PROVIDER_SITE_OTHER): Payer: Self-pay

## 2017-10-03 ENCOUNTER — Inpatient Hospital Stay: Payer: Medicare Other

## 2017-10-03 ENCOUNTER — Inpatient Hospital Stay: Payer: Medicare Other | Attending: Oncology | Admitting: Oncology

## 2017-10-03 ENCOUNTER — Encounter: Payer: Self-pay | Admitting: Oncology

## 2017-10-03 VITALS — BP 150/68 | HR 48 | Temp 98.1°F | Resp 18 | Ht 62.0 in | Wt 169.4 lb

## 2017-10-03 DIAGNOSIS — D472 Monoclonal gammopathy: Secondary | ICD-10-CM | POA: Diagnosis not present

## 2017-10-03 DIAGNOSIS — D649 Anemia, unspecified: Secondary | ICD-10-CM | POA: Insufficient documentation

## 2017-10-03 LAB — COMPREHENSIVE METABOLIC PANEL
ALBUMIN: 3.6 g/dL (ref 3.5–5.0)
ALK PHOS: 81 U/L (ref 38–126)
ALT: 7 U/L — ABNORMAL LOW (ref 14–54)
ANION GAP: 8 (ref 5–15)
AST: 14 U/L — AB (ref 15–41)
BILIRUBIN TOTAL: 0.2 mg/dL — AB (ref 0.3–1.2)
BUN: 10 mg/dL (ref 6–20)
CALCIUM: 8.8 mg/dL — AB (ref 8.9–10.3)
CO2: 23 mmol/L (ref 22–32)
Chloride: 102 mmol/L (ref 101–111)
Creatinine, Ser: 0.87 mg/dL (ref 0.44–1.00)
GFR calc Af Amer: >60 mL/min (ref >60)
GFR calc non Af Amer: >60 mL/min (ref >60)
GLUCOSE: 95 mg/dL (ref 65–99)
POTASSIUM: 3.8 mmol/L (ref 3.5–5.1)
SODIUM: 133 mmol/L — AB (ref 135–145)
TOTAL PROTEIN: 6.7 g/dL (ref 6.5–8.1)

## 2017-10-03 LAB — CBC WITH DIFFERENTIAL/PLATELET
BASOS ABS: 0.1 10*3/uL (ref 0–0.1)
BASOS PCT: 2 %
EOS ABS: 0.1 10*3/uL (ref 0–0.7)
Eosinophils Relative: 2 %
HEMATOCRIT: 27.4 % — AB (ref 35.0–47.0)
HEMOGLOBIN: 8.5 g/dL — AB (ref 12.0–16.0)
Lymphocytes Relative: 30 %
Lymphs Abs: 1.4 10*3/uL (ref 1.0–3.6)
MCH: 20.5 pg — ABNORMAL LOW (ref 26.0–34.0)
MCHC: 31.3 g/dL — AB (ref 32.0–36.0)
MCV: 65.5 fL — ABNORMAL LOW (ref 80.0–100.0)
MONOS PCT: 7 %
Monocytes Absolute: 0.3 10*3/uL (ref 0.2–0.9)
NEUTROS ABS: 2.9 10*3/uL (ref 1.4–6.5)
NEUTROS PCT: 59 %
Platelets: 487 10*3/uL — ABNORMAL HIGH (ref 150–440)
RBC: 4.18 MIL/uL (ref 3.80–5.20)
RDW: 19.8 % — ABNORMAL HIGH (ref 11.5–14.5)
WBC: 4.8 10*3/uL (ref 3.6–11.0)

## 2017-10-03 LAB — IRON AND TIBC
Iron: 19 ug/dL — ABNORMAL LOW (ref 28–170)
SATURATION RATIOS: 4 % — AB (ref 10.4–31.8)
TIBC: 489 ug/dL — ABNORMAL HIGH (ref 250–450)
UIBC: 470 ug/dL

## 2017-10-03 LAB — FERRITIN: Ferritin: 3 ng/mL — ABNORMAL LOW (ref 11–307)

## 2017-10-03 LAB — FOLATE: FOLATE: 20.9 ng/mL (ref >5.9)

## 2017-10-03 NOTE — Progress Notes (Signed)
Hematology/Oncology Consult note [facility] Telephone:([phone] [phone]   Patient Care Team: Danelle Berry, NP as PCP - General (Nurse Practitioner)  REFERRING PROVIDER: Danelle Berry, NP CHIEF COMPLAINTS/PURPOSE OF CONSULTATION:  Evaluation of monoclonal gammopathy.  HISTORY OF PRESENTING ILLNESS:  Sydney Evans is a  67 y.o.  female with PMH listed below who was referred to me for evaluation of monoclonal gammopathy.  Patient has a history of MGUS which was previously worked up extensively at [facility]. Extensive medical record review was performed. 04/10/2017 SPEP showed monoclonal gammopathy detected with IgG-lambda.  Free light chain kappa 2.34, light chain lambda 1.74, ratio 1.34. 05/07/2017 patient underwent bone marrow biopsy.  Pathology showed normocellular 40% bone marrow with trilineage hematopoiesis.  Lambda prominent plasma cells suspicious for low level plasma cell dyscrasia 2%.  No significant increase in blast.  Absent storage iron.  Bone marrow karyotype showed no clonal abnormality.  MDS FISH panel showed no abnormality involving chromosome of 5, 7, 8, 20. Myeloma FISH panel was attempted unable to result due to separation of CD 138+ plasma cells from this marrow specimen failed to yield an adequate number of specificity of cells for FISH. Marrow flow cytometry immunophenotyping showed negative for phenotypically abnormal asthma cell population.  Negative for monoclonal B-cell population.  Patient want to transfer her care to local and establish care with our cancer center.  She lives by herself.  She brought in therapy service dog. Overall she feels well at baseline.  She follows up with neurologist for chronic balancing problem/gait instability and going to have EEG done. Reports feeling tired.  Denies shortness of breath, chest pain, abdominal pain, cough, lower extremity swelling.   Review of Systems  Constitutional: Positive  for malaise/fatigue. Negative for chills, fever and weight loss.  HENT: Negative for congestion, ear discharge, ear pain, nosebleeds, sinus pain and sore throat.   Eyes: Negative for double vision, photophobia, pain, discharge and redness.  Respiratory: Negative for cough, hemoptysis, sputum production, shortness of breath and wheezing.   Cardiovascular: Negative for chest pain, palpitations, orthopnea, claudication and leg swelling.  Gastrointestinal: Negative for abdominal pain, blood in stool, constipation, diarrhea, heartburn, melena, nausea and vomiting.  Genitourinary: Negative for dysuria, flank pain, frequency and hematuria.  Musculoskeletal: Negative for back pain, myalgias and neck pain.  Skin: Negative for itching and rash.  Neurological: Negative for dizziness, tingling, tremors, focal weakness, weakness and headaches.  Endo/Heme/Allergies: Negative for environmental allergies. Does not bruise/bleed easily.  Psychiatric/Behavioral: Negative for depression and hallucinations. The patient is not nervous/anxious.     MEDICAL HISTORY:  Past Medical History:  Diagnosis Date  . Arthritis   . Bipolar disorder ([place])   . 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 Blood clot in vein   . Cancer ([place])    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  . Carotid arterial disease ([place])   . Contracture of joint of upper arm   . Elevated lipids   . GERD (gastroesophageal reflux disease)   . Hypertension   . Osteoporosis   . Poor balance   . Sinus congestion   . 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 TIA (transient ischemic attack)     SURGICAL HISTORY: Past Surgical History:  Procedure Laterality Date  . ABDOMINAL HYSTERECTOMY    . APPENDECTOMY    . BACK SURGERY    . BREAST BIOPSY Left   . CARPAL TUNNEL RELEASE Bilateral   . CATARACT EXTRACTION W/ INTRAOCULAR LENS  IMPLANT,  BILATERAL Bilateral   . CHOLECYSTECTOMY    . crystal cyst removed on left foot    . femoral fx    . FOOT SURGERY    . KNEE SURGERY    . TONSILLECTOMY    .  TOTAL KNEE ARTHROPLASTY Bilateral   . TOTAL SHOULDER ARTHROPLASTY Left 08/01/2016   Procedure: TOTAL SHOULDER ARTHROPLASTY;  Surgeon: Corky Mull, MD;  Location: [facility] ORS;  Service: Orthopedics;  Laterality: Left;    SOCIAL HISTORY: Social History   Socioeconomic History  . Marital status: Single    Spouse name: Not on file  . Number of children: Not on file  . Years of education: Not on file  . Highest education level: Not on file  Occupational History  . Not on file  Social Needs  . Financial resource strain: Not on file  . Food insecurity:    Worry: Not on file    Inability: Not on file  . Transportation needs:    Medical: Not on file    Non-medical: Not on file  Tobacco Use  . Smoking status: Former Smoker    Packs/day: 0.25    Types: Cigarettes    Last attempt to quit: 08/04/2016    Years since quitting: 1.1  . Smokeless tobacco: Never Used  Substance and Sexual Activity  . Alcohol use: No  . Drug use: No  . Sexual activity: Not on file  Lifestyle  . Physical activity:    Days per week: Not on file    Minutes per session: Not on file  . Stress: Not on file  Relationships  . Social connections:    Talks on phone: Not on file    Gets together: Not on file    Attends religious service: Not on file    Active member of club or organization: Not on file    Attends meetings of clubs or organizations: Not on file    Relationship status: Not on file  . Intimate partner violence:    Fear of current or ex partner: Not on file    Emotionally abused: Not on file    Physically abused: Not on file    Forced sexual activity: Not on file  Other Topics Concern  . Not on file  Social History Narrative  . Not on file    FAMILY HISTORY: Family History  Problem Relation Age of Onset  . CAD Mother     ALLERGIES:  is allergic to gabapentin; ketorolac; hydrocodone; percocet [oxycodone-acetaminophen]; and toradol [ketorolac tromethamine].  MEDICATIONS:  Current Outpatient  Medications  Medication Sig Dispense Refill  . aspirin EC 325 MG tablet Take 1 tablet (325 mg total) by mouth daily. 30 tablet 3  . Carboxymethylcellulose Sodium (LUBRICANT EYE DROPS OP) Place 1 drop into both eyes daily.    . citalopram (CELEXA) 40 MG tablet Take 40 mg by mouth daily.    . diphenhydrAMINE (BENADRYL) 25 MG tablet Take 25 mg by mouth every 6 (six) hours as needed for itching.    . fluticasone (FLONASE) 50 MCG/ACT nasal spray Place 2 sprays into both nostrils daily as needed for allergies or rhinitis.    . hydrochlorothiazide (MICROZIDE) 12.5 MG capsule Take 12.5 mg by mouth daily.    [name] lamoTRIgine (LAMICTAL) 200 MG tablet Take 200 mg by mouth 2 (two) times daily.    [name] latanoprost (XALATAN) 0.005 % ophthalmic solution Place 2 drops into both eyes at bedtime.     [name] lisinopril (PRINIVIL,ZESTRIL) 40 MG tablet Take  40 mg by mouth daily.    . Magnesium 250 MG TABS Take 250 mg by mouth daily.     [name] omeprazole (PRILOSEC) 40 MG capsule Take 40 mg by mouth daily.    . ranitidine (ZANTAC) 150 MG tablet Take 150 mg by mouth daily as needed for heartburn.    . Ascorbic Acid (VITAMIN C WITH ROSE HIPS) 1000 MG tablet Take 1,000 mg by mouth 4 (four) times daily.    [name] atorvastatin (LIPITOR) 40 MG tablet Take 1 tablet (40 mg total) by mouth daily at 6 PM. (Patient not taking: Reported on 10/03/2017) 30 tablet 2  . CALCIUM-MAGNESIUM-ZINC PO Take 1 tablet by mouth 2 (two) times daily. For bone & muscle health.    . Cholecalciferol (VITAMIN D) 2000 units tablet Take 2,000 Units by mouth 2 (two) times daily.    . diclofenac (VOLTAREN) 75 MG EC tablet Take 75 mg by mouth 2 (two) times daily.     . fluconazole (DIFLUCAN) 150 MG tablet Take 150 mg by mouth daily.    [name] HYDROcodone-acetaminophen (NORCO/VICODIN) 5-325 MG tablet Take 1 tablet by mouth every 6 (six) hours as needed for moderate pain.    [name] HYDROmorphone (DILAUDID) 2 MG tablet Take 1-2 tablets (2-4 mg total) by mouth every 4 (four) hours as  needed for severe pain. (Patient not taking: Reported on 11/14/2016) 60 tablet 0  . meclizine (ANTIVERT) 25 MG tablet Take 1 tablet (25 mg total) by mouth 3 (three) times daily as needed for dizziness. 20 tablet 0  . Melatonin 5 MG CAPS Take by mouth daily.    . meloxicam (MOBIC) 15 MG tablet Take 15 mg by mouth daily as needed for pain.     . Multiple Vitamin (MULTIVITAMIN WITH MINERALS) TABS tablet Take 1 tablet by mouth daily. One-A-Day Multivitamin for Women 50+     No current facility-administered medications for this visit.      PHYSICAL EXAMINATION: ECOG PERFORMANCE STATUS: 1 - Symptomatic but completely ambulatory Vitals:   10/03/17 1122  BP: (!) 150/68  Pulse: (!) 48  Resp: 18  Temp: 98.1 F (36.7 C)   Filed Weights   10/03/17 1122  Weight: 169 lb 6.4 oz (76.8 kg)    Physical Exam  Constitutional: She is oriented to person, place, and time. She appears well-developed and well-nourished. No distress.  HENT:  Head: Normocephalic and atraumatic.  Right Ear: External ear normal.  Left Ear: External ear normal.  Mouth/Throat: Oropharynx is clear and moist.  Eyes: Pupils are equal, round, and reactive to light. Conjunctivae and EOM are normal. No scleral icterus.  Neck: Normal range of motion. Neck supple.  Cardiovascular: Normal rate, regular rhythm and normal heart sounds.  Pulmonary/Chest: Effort normal and breath sounds normal. No respiratory distress. She has no wheezes. She has no rales. She exhibits no tenderness.  Abdominal: Soft. Bowel sounds are normal. She exhibits no distension and no mass. There is no tenderness.  Musculoskeletal: Normal range of motion. She exhibits no edema or deformity.  Lymphadenopathy:    She has no cervical adenopathy.  Neurological: She is alert and oriented to person, place, and time. No cranial nerve deficit. Coordination normal.  Skin: Skin is warm and dry. No rash noted.  Psychiatric: She has a normal mood and affect. Her behavior  is normal.     LABORATORY DATA:  I have reviewed the data as listed Lab Results  Component Value Date   WBC 4.8 10/03/2017   HGB 8.5 (L)  10/03/2017   HCT 27.4 (L) 10/03/2017   MCV 65.5 (L) 10/03/2017   PLT 487 (H) 10/03/2017   Recent Labs    11/15/16 1404 02/15/17 1311 10/03/17 1214  NA 137 135 133*  K 3.4* 3.4* 3.8  CL 103 102 102  CO2 27 23 23   GLUCOSE 109* 133* 95  BUN 11 10 10   CREATININE 0.99 0.89 0.87  CALCIUM 9.4 8.9 8.8*  GFRNONAA 59* >60 >60  GFRAA >60 >60 >60  PROT 6.9  --  6.7  ALBUMIN 3.8  --  3.6  AST 19  --  14*  ALT 11*  --  7*  ALKPHOS 90  --  81  BILITOT 0.5  --  0.2*       ASSESSMENT & PLAN:  1. MGUS (monoclonal gammopathy of unknown significance)   2. Anemia, unspecified type   Ig G lambda MGUS Patient MGUS diagnosis was discussed with patient and I explained to patient that she has a pre-myeloma state.   I recommend her to follow-up with me twice a year.  We will check her SPEP, immunofixation and free light chain ratio beta, microglobulin  #Anemia. Will check CBC w differential, CMP, vitamin B12, Folate, iron/TIBC, ferritin, reticulocytes.  Bone marrow report from [facility] showed absent bone marrow iron store.  Likely she has iron deficiency anemia.  Iron panel pending.  Will discuss with patient if she needs iron treatments.  All questions were answered. The patient knows to call the clinic with any problems questions or concerns.  Return of visit: 6 months. Thank you for this kind referral and the opportunity to participate in the care of this patient. A copy of today's note is routed to referring provider  Total face to face encounter time for this patient visit was 45 min. >50% of the time was  spent in counseling and coordination of care.    Earlie Server, MD, PhD Hematology Oncology [facility] Pager- [phone] 10/03/2017

## 2017-10-04 LAB — VITAMIN B12: Vitamin B-12: 206 pg/mL (ref 180–914)

## 2017-10-05 ENCOUNTER — Other Ambulatory Visit: Payer: Self-pay | Admitting: Oncology

## 2017-10-05 DIAGNOSIS — D509 Iron deficiency anemia, unspecified: Secondary | ICD-10-CM

## 2017-10-06 LAB — KAPPA/LAMBDA LIGHT CHAINS
KAPPA, LAMDA LIGHT CHAIN RATIO: 1.53 (ref 0.26–1.65)
Kappa free light chain: 21.9 mg/L — ABNORMAL HIGH (ref 3.3–19.4)
LAMDA FREE LIGHT CHAINS: 14.3 mg/L (ref 5.7–26.3)

## 2017-10-06 LAB — BETA 2 MICROGLOBULIN, SERUM: Beta-2 Microglobulin: 1.7 mg/L (ref 0.6–2.4)

## 2017-10-07 ENCOUNTER — Encounter: Payer: Self-pay | Admitting: Podiatry

## 2017-10-07 ENCOUNTER — Ambulatory Visit (INDEPENDENT_AMBULATORY_CARE_PROVIDER_SITE_OTHER): Payer: Medicare Other

## 2017-10-07 ENCOUNTER — Ambulatory Visit (INDEPENDENT_AMBULATORY_CARE_PROVIDER_SITE_OTHER): Payer: Medicare Other | Admitting: Podiatry

## 2017-10-07 DIAGNOSIS — M2042 Other hammer toe(s) (acquired), left foot: Secondary | ICD-10-CM

## 2017-10-07 DIAGNOSIS — Z9181 History of falling: Secondary | ICD-10-CM

## 2017-10-07 DIAGNOSIS — M2041 Other hammer toe(s) (acquired), right foot: Secondary | ICD-10-CM

## 2017-10-08 LAB — MULTIPLE MYELOMA PANEL, SERUM
ALBUMIN SERPL ELPH-MCNC: 3.3 g/dL (ref 2.9–4.4)
ALBUMIN/GLOB SERPL: 1.2 (ref 0.7–1.7)
ALPHA 1: 0.3 g/dL (ref 0.0–0.4)
ALPHA2 GLOB SERPL ELPH-MCNC: 0.8 g/dL (ref 0.4–1.0)
B-GLOBULIN SERPL ELPH-MCNC: 1.1 g/dL (ref 0.7–1.3)
GAMMA GLOB SERPL ELPH-MCNC: 0.7 g/dL (ref 0.4–1.8)
GLOBULIN, TOTAL: 2.9 g/dL (ref 2.2–3.9)
IGG (IMMUNOGLOBIN G), SERUM: 747 mg/dL (ref 700–1600)
IgA: 253 mg/dL (ref 87–352)
IgM (Immunoglobulin M), Srm: 107 mg/dL (ref 26–217)
M PROTEIN SERPL ELPH-MCNC: 0.2 g/dL — AB
Total Protein ELP: 6.2 g/dL (ref 6.0–8.5)

## 2017-10-09 NOTE — Progress Notes (Signed)
   HPI: 67 year old female presenting as a new patient with a chief complaint of pain to the bilateral feet, right worse than left, that has been ongoing for years. She reports hammertoes of the right foot, callus lesions and fungus to the toenails. There are no modifying factors noted. She has not done anything for treatment. Patient is here for further evaluation and treatment.   Past Medical History:  Diagnosis Date  . Arthritis   . Bipolar disorder ([place])   . 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 Blood clot in vein   . Cancer ([place])    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  . Carotid arterial disease ([place])   . Contracture of joint of upper arm   . Elevated lipids   . GERD (gastroesophageal reflux disease)   . Hypertension   . Osteoporosis   . Poor balance   . Sinus congestion   . 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 TIA (transient ischemic attack)       Objective: Physical Exam General: The patient is alert and oriented x3 in no acute distress.  Dermatology: Skin is cool, dry and supple bilateral lower extremities. Negative for open lesions or macerations.  Vascular: Palpable pedal pulses bilaterally. No edema or erythema noted. Capillary refill within normal limits.  Neurological: Epicritic and protective threshold grossly intact bilaterally.   Musculoskeletal Exam: All pedal and ankle joints range of motion within normal limits bilateral. Muscle strength 5/5 in all groups bilateral. Hammertoe contracture deformity noted to digits 2-5 of the bilateral feet.  Radiographic Exam: Hammertoe contracture deformity noted to the interphalangeal joints and MPJ of the respective hammertoe digits mentioned on clinical musculoskeletal exam.     Assessment: 1. H/o multiple falls 2. 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 Patient evaluated. X-Rays reviewed.  2. Appointment with Benjie Karvonen for [place]. 3. Recommended good shoe gear.  4. Return to clinic as needed.    Edrick Kins, DPM [facility]  Dr. Edrick Kins, DPM    [address], [place] [address]                Office [phone]  Fax [phone]

## 2017-10-14 ENCOUNTER — Inpatient Hospital Stay: Payer: Medicare Other | Attending: Oncology | Admitting: Oncology

## 2017-10-14 ENCOUNTER — Encounter: Payer: Self-pay | Admitting: Oncology

## 2017-10-14 ENCOUNTER — Ambulatory Visit: Payer: Medicare Other | Admitting: Oncology

## 2017-10-14 ENCOUNTER — Ambulatory Visit: Payer: Medicare Other

## 2017-10-14 ENCOUNTER — Inpatient Hospital Stay: Payer: Medicare Other

## 2017-10-14 ENCOUNTER — Other Ambulatory Visit: Payer: Self-pay

## 2017-10-14 VITALS — BP 123/67 | HR 50 | Resp 18

## 2017-10-14 VITALS — BP 137/67 | HR 54 | Temp 97.8°F | Resp 18 | Wt 173.5 lb

## 2017-10-14 DIAGNOSIS — R5383 Other fatigue: Secondary | ICD-10-CM | POA: Insufficient documentation

## 2017-10-14 DIAGNOSIS — D75839 Thrombocytosis, unspecified: Secondary | ICD-10-CM

## 2017-10-14 DIAGNOSIS — D509 Iron deficiency anemia, unspecified: Secondary | ICD-10-CM | POA: Diagnosis present

## 2017-10-14 DIAGNOSIS — D472 Monoclonal gammopathy: Secondary | ICD-10-CM | POA: Diagnosis not present

## 2017-10-14 DIAGNOSIS — D473 Essential (hemorrhagic) thrombocythemia: Secondary | ICD-10-CM

## 2017-10-14 MED ORDER — IRON SUCROSE 20 MG/ML IV SOLN
200.0000 mg | Freq: Once | INTRAVENOUS | Status: AC
Start: 2017-10-14 — End: 2017-10-14
  Administered 2017-10-14: 200 mg via INTRAVENOUS
  Filled 2017-10-14: qty 10

## 2017-10-14 MED ORDER — SODIUM CHLORIDE 0.9 % IV SOLN
Freq: Once | INTRAVENOUS | Status: AC
  Administered 2017-10-14: 15:00:00 via INTRAVENOUS
  Filled 2017-10-14: qty 1000

## 2017-10-14 NOTE — Progress Notes (Signed)
Patient here today for follow up. No concerns voiced.  °

## 2017-10-14 NOTE — Progress Notes (Signed)
Hematology/Oncology follow up note [facility] [phone] [phone]   Patient Care Team: Danelle Berry, NP as PCP - General (Nurse Practitioner)  REFERRING PROVIDER: Danelle Berry, NP  REASON FOR VISIT Follow up for treatment of iron deficiency anemia and management of monoclonal gammopathy.  HISTORY OF PRESENTING ILLNESS:  Sydney Evans is a  67 y.o.  female with PMH listed below who was referred to me for evaluation of monoclonal gammopathy.  Patient has a history of MGUS which was previously worked up extensively at [facility]. Extensive medical record review was performed. 04/10/2017 SPEP showed monoclonal gammopathy detected with IgG-lambda.  Free light chain kappa 2.34, light chain lambda 1.74, ratio 1.34. 05/07/2017 patient underwent bone marrow biopsy.  Pathology showed normocellular 40% bone marrow with trilineage hematopoiesis.  Lambda prominent plasma cells suspicious for low level plasma cell dyscrasia 2%.  No significant increase in blast.  Absent storage iron.  Bone marrow karyotype showed no clonal abnormality.  MDS FISH panel showed no abnormality involving chromosome of 5, 7, 8, 20. Myeloma FISH panel was attempted unable to result due to separation of CD 138+ plasma cells from this marrow specimen failed to yield an adequate number of specificity of cells for FISH. Marrow flow cytometry immunophenotyping showed negative for phenotypically abnormal asthma cell population.  Negative for monoclonal B-cell population.  Patient want to transfer her care to local and establish care with our cancer center.  She lives by herself.  She brought in therapy service dog. Overall she feels well at baseline.  She follows up with neurologist for chronic balancing problem/gait instability and going to have EEG done. Reports feeling tired.  Denies shortness of breath, chest pain, abdominal pain, cough, lower extremity swelling.   INTERVAL  HISTORY [name] is a 67 y.o. female who has above history reviewed by me today presents for follow up visit for management of iron deficiency anemia and monoclonal gammopathy. Problems and complaints are listed below: #Anemia, stable. #Fatigue: reports worsening fatigue. Chronic onset, perisistent, no aggravating or improving factors, no associated symptoms.  #Chronic balancing issue, stable.   Review of Systems  Constitutional: Positive for malaise/fatigue. Negative for chills, fever and weight loss.  HENT: Negative for congestion, ear discharge, ear pain, nosebleeds, sinus pain and sore throat.   Eyes: Negative for double vision, photophobia, pain, discharge and redness.  Respiratory: Negative for cough, hemoptysis, sputum production, shortness of breath and wheezing.   Cardiovascular: Negative for chest pain, palpitations, orthopnea, claudication and leg swelling.  Gastrointestinal: Negative for abdominal pain, blood in stool, constipation, diarrhea, heartburn, melena, nausea and vomiting.  Genitourinary: Negative for dysuria, flank pain, frequency and hematuria.  Musculoskeletal: Negative for back pain, myalgias and neck pain.  Skin: Negative for itching and rash.  Neurological: Negative for dizziness, tingling, tremors, focal weakness, weakness and headaches.  Endo/Heme/Allergies: Negative for environmental allergies. Does not bruise/bleed easily.  Psychiatric/Behavioral: Negative for depression and hallucinations. The patient is not nervous/anxious.   Chronic balancing problem chronic balancing problem  MEDICAL HISTORY:  Past Medical History:  Diagnosis Date  . Arthritis   . Bipolar disorder ([place])   . 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 Blood clot in vein   . Cancer ([place])    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  . Carotid arterial disease ([place])   . Contracture of joint of upper arm   . Elevated lipids   . GERD (gastroesophageal reflux disease)   . Hypertension   . Osteoporosis   .  Poor  balance   . Sinus congestion   . 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 TIA (transient ischemic attack)     SURGICAL HISTORY: Past Surgical History:  Procedure Laterality Date  . ABDOMINAL HYSTERECTOMY    . APPENDECTOMY    . BACK SURGERY    . BREAST BIOPSY Left   . CARPAL TUNNEL RELEASE Bilateral   . CATARACT EXTRACTION W/ INTRAOCULAR LENS  IMPLANT, BILATERAL Bilateral   . CHOLECYSTECTOMY    . crystal cyst removed on left foot    . femoral fx    . FOOT SURGERY    . KNEE SURGERY    . TONSILLECTOMY    . TOTAL KNEE ARTHROPLASTY Bilateral   . TOTAL SHOULDER ARTHROPLASTY Left 08/01/2016   Procedure: TOTAL SHOULDER ARTHROPLASTY;  Surgeon: Corky Mull, MD;  Location: [facility] ORS;  Service: Orthopedics;  Laterality: Left;    SOCIAL HISTORY: Social History   Socioeconomic History  . Marital status: Single    Spouse name: Not on file  . Number of children: Not on file  . Years of education: Not on file  . Highest education level: Not on file  Occupational History  . Not on file  Social Needs  . Financial resource strain: Not on file  . Food insecurity:    Worry: Not on file    Inability: Not on file  . Transportation needs:    Medical: Not on file    Non-medical: Not on file  Tobacco Use  . Smoking status: Former Smoker    Packs/day: 0.25    Types: Cigarettes    Last attempt to quit: 08/04/2016    Years since quitting: 1.1  . Smokeless tobacco: Never Used  Substance and Sexual Activity  . Alcohol use: No  . Drug use: No  . Sexual activity: Not on file  Lifestyle  . Physical activity:    Days per week: Not on file    Minutes per session: Not on file  . Stress: Not on file  Relationships  . Social connections:    Talks on phone: Not on file    Gets together: Not on file    Attends religious service: Not on file    Active member of club or organization: Not on file    Attends meetings of clubs or organizations: Not on file    Relationship status: Not on file  . Intimate partner  violence:    Fear of current or ex partner: Not on file    Emotionally abused: Not on file    Physically abused: Not on file    Forced sexual activity: Not on file  Other Topics Concern  . Not on file  Social History Narrative  . Not on file    FAMILY HISTORY: Family History  Problem Relation Age of Onset  . CAD Mother     ALLERGIES:  is allergic to gabapentin; ketorolac; hydrocodone; percocet [oxycodone-acetaminophen]; and toradol [ketorolac tromethamine].  MEDICATIONS:  Current Outpatient Medications  Medication Sig Dispense Refill  . Ascorbic Acid (VITAMIN C WITH ROSE HIPS) 1000 MG tablet Take 1,000 mg by mouth 4 (four) times daily.    . Ascorbic Acid (VITAMIN C) 1000 MG tablet Take by mouth.    [name] aspirin EC 325 MG tablet Take 1 tablet (325 mg total) by mouth daily. 30 tablet 3  . CALCIUM-MAGNESIUM-ZINC PO Take 1 tablet by mouth 2 (two) times daily. For bone & muscle health.    . Carboxymethylcellulose Sodium (LUBRICANT  EYE DROPS OP) Place 1 drop into both eyes daily.    . Cholecalciferol (VITAMIN D) 2000 units tablet Take 2,000 Units by mouth 2 (two) times daily.    . citalopram (CELEXA) 40 MG tablet Take 40 mg by mouth daily.    . diphenhydrAMINE (BENADRYL) 25 MG tablet Take 25 mg by mouth every 6 (six) hours as needed for itching.    . fluticasone (FLONASE) 50 MCG/ACT nasal spray Place 2 sprays into both nostrils daily as needed for allergies or rhinitis.    . hydrochlorothiazide (MICROZIDE) 12.5 MG capsule Take 12.5 mg by mouth daily.    . IBU 800 MG tablet     . lamoTRIgine (LAMICTAL) 200 MG tablet Take 200 mg by mouth 2 (two) times daily.    [name] latanoprost (XALATAN) 0.005 % ophthalmic solution Place 2 drops into both eyes at bedtime.     [name] lisinopril (PRINIVIL,ZESTRIL) 40 MG tablet Take 40 mg by mouth daily.    . meclizine (ANTIVERT) 25 MG tablet Take 1 tablet (25 mg total) by mouth 3 (three) times daily as needed for dizziness. 20 tablet 0  . Multiple Vitamin  (MULTIVITAMIN WITH MINERALS) TABS tablet Take 1 tablet by mouth daily. One-A-Day Multivitamin for Women 50+    . omeprazole (PRILOSEC) 40 MG capsule Take 40 mg by mouth daily.    [name] atorvastatin (LIPITOR) 40 MG tablet Take 1 tablet (40 mg total) by mouth daily at 6 PM. (Patient not taking: Reported on 10/03/2017) 30 tablet 2  . atorvastatin (LIPITOR) 40 MG tablet atorvastatin 40 mg tablet    . diazepam (VALIUM) 2 MG tablet     . diclofenac (VOLTAREN) 75 MG EC tablet Take 75 mg by mouth 2 (two) times daily.     . fluconazole (DIFLUCAN) 150 MG tablet Take 150 mg by mouth daily.    [name] HYDROcodone-acetaminophen (NORCO/VICODIN) 5-325 MG tablet Take 1 tablet by mouth every 6 (six) hours as needed for moderate pain.    [name] HYDROmorphone (DILAUDID) 2 MG tablet Take 1-2 tablets (2-4 mg total) by mouth every 4 (four) hours as needed for severe pain. (Patient not taking: Reported on 11/14/2016) 60 tablet 0  . Magnesium 250 MG TABS Take 250 mg by mouth daily.     . Melatonin 5 MG CAPS Take by mouth daily.    . meloxicam (MOBIC) 15 MG tablet Take 15 mg by mouth daily as needed for pain.     . ranitidine (ZANTAC) 150 MG tablet Take 150 mg by mouth daily as needed for heartburn.     No current facility-administered medications for this visit.      PHYSICAL EXAMINATION: ECOG PERFORMANCE STATUS: 1 - Symptomatic but completely ambulatory Vitals:   10/14/17 1423  BP: 137/67  Pulse: (!) 54  Resp: 18  Temp: 97.8 F (36.6 C)   Filed Weights   10/14/17 1423  Weight: 173 lb 8 oz (78.7 kg)    Physical Exam  Constitutional: She is oriented to person, place, and time. She appears well-developed and well-nourished. No distress.  HENT:  Head: Normocephalic and atraumatic.  Right Ear: External ear normal.  Left Ear: External ear normal.  Mouth/Throat: Oropharynx is clear and moist.  Eyes: Pupils are equal, round, and reactive to light. EOM are normal. No scleral icterus.  Pale conjunctivae  Neck: Normal range  of motion. Neck supple.  Cardiovascular: Normal rate, regular rhythm and normal heart sounds.  Pulmonary/Chest: Effort normal and breath sounds normal. No respiratory distress. She has  no wheezes. She has no rales. She exhibits no tenderness.  Abdominal: Soft. Bowel sounds are normal. She exhibits no distension and no mass. There is no tenderness.  Musculoskeletal: Normal range of motion. She exhibits no edema or deformity.  Lymphadenopathy:    She has no cervical adenopathy.  Neurological: She is alert and oriented to person, place, and time. No cranial nerve deficit. Coordination normal.  Skin: Skin is warm and dry. No rash noted.  Psychiatric: She has a normal mood and affect. Her behavior is normal. Thought content normal.     LABORATORY DATA:  I have reviewed the data as listed Lab Results  Component Value Date   WBC 4.8 10/03/2017   HGB 8.5 (L) 10/03/2017   HCT 27.4 (L) 10/03/2017   MCV 65.5 (L) 10/03/2017   PLT 487 (H) 10/03/2017   Recent Labs    11/15/16 1404 02/15/17 1311 10/03/17 1214  NA 137 135 133*  K 3.4* 3.4* 3.8  CL 103 102 102  CO2 _0 GLUCOSE 109* 133* 95  BUN _1 CREATININE 0.99 0.89 0.87  CALCIUM 9.4 8.9 8.8*  GFRNONAA 59* >60 >60  GFRAA >60 >60 >60  PROT 6.9  --  6.7  ALBUMIN 3.8  --  3.6  AST 19  --  14*  ALT 11*  --  7*  ALKPHOS 90  --  81  BILITOT 0.5  --  0.2*       ASSESSMENT & PLAN:  1. Iron deficiency anemia, unspecified iron deficiency anemia type   2. MGUS (monoclonal gammopathy of unknown significance)   3. Thrombocytosis (HCC)    #Iron deficiency anemia: Lab results discussed with patient.  Consistent with iron deficient anemia. Plan IV iron with Venofer 269m weekly for 4 doses. Allergy reactions/infusion reaction including anaphylactic reaction discussed with patient. Patient voices understanding and willing to proceed.  #MGUS: IgG lambda Follow-up twice a year.  SPEP  M spike 0.2, normal light chain ratio   #  Thrombocytosis, this is a new problem.  I think most likely secondary to iron deficiency.  Continue monitor.  If thrombocytosis persist after iron replete, will order additional work-up.  All questions were answered. The patient knows to call the clinic with any problems questions or concerns.  Return of visit: 5 weeks for reassessment for need of additional IV iron.   ZEarlie Server MD, PhD Hematology Oncology CCoven[facility] Centerat AThe [facility] Sure[facility] Of Oak Ridge LLCPager- [phone]/03/2018

## 2017-10-15 ENCOUNTER — Ambulatory Visit: Payer: Medicare Other

## 2017-10-15 ENCOUNTER — Ambulatory Visit: Payer: Medicare Other | Admitting: Oncology

## 2017-10-21 ENCOUNTER — Inpatient Hospital Stay: Payer: Medicare Other

## 2017-10-21 VITALS — BP 115/71 | HR 57 | Temp 97.9°F | Resp 18

## 2017-10-21 DIAGNOSIS — D509 Iron deficiency anemia, unspecified: Secondary | ICD-10-CM

## 2017-10-21 MED ORDER — IRON SUCROSE 20 MG/ML IV SOLN
200.0000 mg | Freq: Once | INTRAVENOUS | Status: AC
  Administered 2017-10-21: 200 mg via INTRAVENOUS
  Filled 2017-10-21: qty 10

## 2017-10-21 MED ORDER — SODIUM CHLORIDE 0.9 % IV SOLN
Freq: Once | INTRAVENOUS | Status: AC
  Administered 2017-10-21: 14:00:00 via INTRAVENOUS
  Filled 2017-10-21: qty 1000

## 2017-10-28 ENCOUNTER — Inpatient Hospital Stay: Payer: Medicare Other

## 2017-10-28 VITALS — BP 122/60 | HR 50 | Temp 96.4°F | Resp 18

## 2017-10-28 DIAGNOSIS — D509 Iron deficiency anemia, unspecified: Secondary | ICD-10-CM

## 2017-10-28 MED ORDER — SODIUM CHLORIDE 0.9 % IV SOLN
Freq: Once | INTRAVENOUS | Status: AC
  Administered 2017-10-28: 14:00:00 via INTRAVENOUS
  Filled 2017-10-28: qty 1000

## 2017-10-28 MED ORDER — IRON SUCROSE 20 MG/ML IV SOLN
200.0000 mg | Freq: Once | INTRAVENOUS | Status: AC
  Administered 2017-10-28: 200 mg via INTRAVENOUS
  Filled 2017-10-28: qty 10

## 2017-11-04 ENCOUNTER — Inpatient Hospital Stay: Payer: Medicare Other | Attending: Oncology

## 2017-11-04 VITALS — BP 136/75 | HR 47 | Temp 96.7°F | Resp 18

## 2017-11-04 DIAGNOSIS — D509 Iron deficiency anemia, unspecified: Secondary | ICD-10-CM | POA: Insufficient documentation

## 2017-11-04 DIAGNOSIS — R5383 Other fatigue: Secondary | ICD-10-CM | POA: Insufficient documentation

## 2017-11-04 DIAGNOSIS — D472 Monoclonal gammopathy: Secondary | ICD-10-CM | POA: Diagnosis not present

## 2017-11-04 MED ORDER — SODIUM CHLORIDE 0.9 % IV SOLN
Freq: Once | INTRAVENOUS | Status: AC
  Administered 2017-11-04: 14:00:00 via INTRAVENOUS
  Filled 2017-11-04: qty 1000

## 2017-11-04 MED ORDER — IRON SUCROSE 20 MG/ML IV SOLN
200.0000 mg | Freq: Once | INTRAVENOUS | Status: AC
  Administered 2017-11-04: 200 mg via INTRAVENOUS
  Filled 2017-11-04: qty 10

## 2017-11-12 ENCOUNTER — Ambulatory Visit: Payer: Medicare Other | Admitting: Orthotics

## 2017-11-12 DIAGNOSIS — F40298 Other specified phobia: Secondary | ICD-10-CM

## 2017-11-12 DIAGNOSIS — Z9181 History of falling: Secondary | ICD-10-CM

## 2017-11-12 DIAGNOSIS — M2041 Other hammer toe(s) (acquired), right foot: Secondary | ICD-10-CM

## 2017-11-12 DIAGNOSIS — Z8673 Personal history of transient ischemic attack (TIA), and cerebral infarction without residual deficits: Secondary | ICD-10-CM

## 2017-11-12 DIAGNOSIS — M2042 Other hammer toe(s) (acquired), left foot: Secondary | ICD-10-CM

## 2017-11-12 DIAGNOSIS — R2681 Unsteadiness on feet: Secondary | ICD-10-CM

## 2017-11-12 NOTE — Progress Notes (Signed)
Patient came in today for Eval/assessment for [place].  Patient presents with a history of falling, and also fearful of falling.  Balance tests performed and patient does have issues keeping balance especially in foot to heel test.  Patient has week dorsiflexors and well as inverters/everters muscle group.  Demonstrants ankle instability.   Patient also has history of CVA events and mini strokes which contribute to muscle weakness; she is assisted by a service dog.  She was cast and tolerated procedure well.

## 2017-11-14 ENCOUNTER — Inpatient Hospital Stay: Payer: Medicare Other

## 2017-11-14 DIAGNOSIS — D509 Iron deficiency anemia, unspecified: Secondary | ICD-10-CM

## 2017-11-14 LAB — IRON AND TIBC
IRON: 62 ug/dL (ref 28–170)
Saturation Ratios: 17 % (ref 10.4–31.8)
TIBC: 370 ug/dL (ref 250–450)
UIBC: 308 ug/dL

## 2017-11-14 LAB — CBC WITH DIFFERENTIAL/PLATELET
Basophils Absolute: 0.1 10*3/uL (ref 0–0.1)
Basophils Relative: 1 %
EOS ABS: 0.2 10*3/uL (ref 0–0.7)
Eosinophils Relative: 3 %
HCT: 32.3 % — ABNORMAL LOW (ref 35.0–47.0)
HEMOGLOBIN: 10.3 g/dL — AB (ref 12.0–16.0)
LYMPHS ABS: 1.7 10*3/uL (ref 1.0–3.6)
Lymphocytes Relative: 29 %
MCH: 23.8 pg — AB (ref 26.0–34.0)
MCHC: 31.9 g/dL — AB (ref 32.0–36.0)
MCV: 74.4 fL — ABNORMAL LOW (ref 80.0–100.0)
MONO ABS: 0.4 10*3/uL (ref 0.2–0.9)
MONOS PCT: 6 %
NEUTROS PCT: 61 %
Neutro Abs: 3.4 10*3/uL (ref 1.4–6.5)
Platelets: 344 10*3/uL (ref 150–440)
RBC: 4.34 MIL/uL (ref 3.80–5.20)
RDW: 27.6 % — ABNORMAL HIGH (ref 11.5–14.5)
WBC: 5.7 10*3/uL (ref 3.6–11.0)

## 2017-11-14 LAB — FERRITIN: FERRITIN: 68 ng/mL (ref 11–307)

## 2017-11-19 ENCOUNTER — Inpatient Hospital Stay: Payer: Medicare Other

## 2017-11-19 ENCOUNTER — Inpatient Hospital Stay (HOSPITAL_BASED_OUTPATIENT_CLINIC_OR_DEPARTMENT_OTHER): Payer: Medicare Other | Admitting: Oncology

## 2017-11-19 ENCOUNTER — Encounter: Payer: Self-pay | Admitting: Oncology

## 2017-11-19 ENCOUNTER — Other Ambulatory Visit: Payer: Self-pay

## 2017-11-19 VITALS — BP 117/70 | HR 51 | Temp 97.6°F | Resp 18 | Wt 175.1 lb

## 2017-11-19 DIAGNOSIS — R5383 Other fatigue: Secondary | ICD-10-CM | POA: Diagnosis not present

## 2017-11-19 DIAGNOSIS — D472 Monoclonal gammopathy: Secondary | ICD-10-CM | POA: Diagnosis not present

## 2017-11-19 DIAGNOSIS — D509 Iron deficiency anemia, unspecified: Secondary | ICD-10-CM

## 2017-11-19 MED ORDER — FERROUS SULFATE 325 (65 FE) MG PO TBEC: 325.0000 mg | DELAYED_RELEASE_TABLET | Freq: Two times a day (BID) | ORAL | 3 refills | Status: DC

## 2017-11-19 NOTE — Progress Notes (Signed)
Hematology/Oncology follow up note [facility] [phone] [phone]   Patient Care Team: Danelle Berry, NP as PCP - General (Nurse Practitioner)  REFERRING PROVIDER: Danelle Berry, NP  REASON FOR VISIT Follow up for treatment of iron deficiency anemia and management of monoclonal gammopathy.  HISTORY OF PRESENTING ILLNESS:  Sydney Evans is a  67 y.o.  female with PMH listed below who was referred to me for evaluation of monoclonal gammopathy.  Patient has a history of MGUS which was previously worked up extensively at [facility]. Extensive medical record review was performed. 04/10/2017 SPEP showed monoclonal gammopathy detected with IgG-lambda.  Free light chain kappa 2.34, light chain lambda 1.74, ratio 1.34. 05/07/2017 patient underwent bone marrow biopsy.  Pathology showed normocellular 40% bone marrow with trilineage hematopoiesis.  Lambda prominent plasma cells suspicious for low level plasma cell dyscrasia 2%.  No significant increase in blast.  Absent storage iron.  Bone marrow karyotype showed no clonal abnormality.  MDS FISH panel showed no abnormality involving chromosome of 5, 7, 8, 20. Myeloma FISH panel was attempted unable to result due to separation of CD 138+ plasma cells from this marrow specimen failed to yield an adequate number of specificity of cells for FISH. Marrow flow cytometry immunophenotyping showed negative for phenotypically abnormal asthma cell population.  Negative for monoclonal B-cell population.  Patient want to transfer her care to local and establish care with our cancer center.  She lives by herself.  She brought in therapy service dog. Overall she feels well at baseline.  She follows up with neurologist for chronic balancing problem/gait instability and going to have EEG done. Reports feeling tired.  Denies shortness of breath, chest pain, abdominal pain, cough, lower extremity swelling.   INTERVAL  HISTORY Sydney Evans is a 67 y.o. female who has above history reviewed by me today presents for follow up visit for management of iron deficiency anemia and a monoclonal gammopathy. During the interval she has received IV Venofer weekly x4 Problems and complaints are listed below: #Anemia, hemoglobin has improved 2 g during interval. #Fatigue: Reports not feeling any improvement after IV iron infusion.  Continue to feel fatigue, no aggravating or improving factors.  Not associate any other symptoms. #Chronic balancing issue, not better or worse. Patient reports not feeling good today, quite emotional.  Mild nausea Service dog in the room.  Review of Systems  Constitutional: Positive for malaise/fatigue. Negative for chills, fever and weight loss.  HENT: Negative for congestion, ear discharge, ear pain, nosebleeds, sinus pain and sore throat.   Eyes: Negative for double vision, photophobia and pain.  Respiratory: Negative for cough, hemoptysis and sputum production.   Cardiovascular: Negative for chest pain, palpitations and orthopnea.  Gastrointestinal: Negative for abdominal pain, blood in stool, constipation, diarrhea, heartburn, melena, nausea and vomiting.  Genitourinary: Negative for dysuria, flank pain, frequency and hematuria.  Musculoskeletal: Negative for back pain, myalgias and neck pain.  Skin: Negative for itching and rash.  Neurological: Negative for dizziness, tingling, tremors, focal weakness, weakness and headaches.       Chronic balancing problem  Endo/Heme/Allergies: Negative for environmental allergies. Does not bruise/bleed easily.  Psychiatric/Behavioral: Positive for depression. Negative for hallucinations. The patient is not nervous/anxious.     MEDICAL HISTORY:  Past Medical History:  Diagnosis Date  . Arthritis   . Bipolar disorder ([place])   . 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 Blood clot in vein   . Cancer ([place])  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  . 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 Contracture of joint of upper arm   . Elevated lipids   . GERD (gastroesophageal reflux disease)   . Hypertension   . Osteoporosis   . Poor balance   . Sinus congestion   . 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 TIA (transient ischemic attack)     SURGICAL HISTORY: Past Surgical History:  Procedure Laterality Date  . ABDOMINAL HYSTERECTOMY    . APPENDECTOMY    . BACK SURGERY    . BREAST BIOPSY Left   . CARPAL TUNNEL RELEASE Bilateral   . CATARACT EXTRACTION W/ INTRAOCULAR LENS  IMPLANT, BILATERAL Bilateral   . CHOLECYSTECTOMY    . crystal cyst removed on left foot    . femoral fx    . FOOT SURGERY    . KNEE SURGERY    . TONSILLECTOMY    . TOTAL KNEE ARTHROPLASTY Bilateral   . TOTAL SHOULDER ARTHROPLASTY Left 08/01/2016   Procedure: TOTAL SHOULDER ARTHROPLASTY;  Surgeon: Corky Mull, MD;  Location: [facility] ORS;  Service: Orthopedics;  Laterality: Left;    SOCIAL HISTORY: Social History   Socioeconomic History  . Marital status: Single    Spouse name: Not on file  . Number of children: Not on file  . Years of education: Not on file  . Highest education level: Not on file  Occupational History  . Not on file  Social Needs  . Financial resource strain: Not on file  . Food insecurity:    Worry: Not on file    Inability: Not on file  . Transportation needs:    Medical: Not on file    Non-medical: Not on file  Tobacco Use  . Smoking status: Former Smoker    Packs/day: 0.25    Types: Cigarettes    Last attempt to quit: 08/04/2016    Years since quitting: 1.2  . Smokeless tobacco: Never Used  Substance and Sexual Activity  . Alcohol use: No  . Drug use: No  . Sexual activity: Not on file  Lifestyle  . Physical activity:    Days per week: Not on file    Minutes per session: Not on file  . Stress: Not on file  Relationships  . Social connections:    Talks on phone: Not on file    Gets together: Not on file    Attends religious service: Not on file    Active  member of club or organization: Not on file    Attends meetings of clubs or organizations: Not on file    Relationship status: Not on file  . Intimate partner violence:    Fear of current or ex partner: Not on file    Emotionally abused: Not on file    Physically abused: Not on file    Forced sexual activity: Not on file  Other Topics Concern  . Not on file  Social History Narrative  . Not on file    FAMILY HISTORY: Family History  Problem Relation Age of Onset  . CAD Mother     ALLERGIES:  is allergic to gabapentin; ketorolac; hydrocodone; percocet [oxycodone-acetaminophen]; and toradol [ketorolac tromethamine].  MEDICATIONS:  Current Outpatient Medications  Medication Sig Dispense Refill  . aspirin EC 325 MG tablet Take 1 tablet (325 mg total) by mouth daily. 30 tablet 3  . CALCIUM-MAGNESIUM-ZINC PO Take 1 tablet by mouth 2 (two) times daily. For bone & muscle health.    [name]  Carboxymethylcellulose Sodium (LUBRICANT EYE DROPS OP) Place 1 drop into both eyes daily.    . Cholecalciferol (VITAMIN D) 2000 units tablet Take 2,000 Units by mouth 2 (two) times daily.    . citalopram (CELEXA) 40 MG tablet Take 40 mg by mouth daily.    . fluticasone (FLONASE) 50 MCG/ACT nasal spray Place 2 sprays into both nostrils daily as needed for allergies or rhinitis.    . hydrochlorothiazide (MICROZIDE) 12.5 MG capsule Take 12.5 mg by mouth daily.    . IBU 800 MG tablet     . lamoTRIgine (LAMICTAL) 200 MG tablet Take 200 mg by mouth 2 (two) times daily.    [name] latanoprost (XALATAN) 0.005 % ophthalmic solution Place 2 drops into both eyes at bedtime.     [name] lisinopril (PRINIVIL,ZESTRIL) 40 MG tablet Take 40 mg by mouth daily.    . Magnesium 250 MG TABS Take 250 mg by mouth daily.     . meloxicam (MOBIC) 15 MG tablet Take 15 mg by mouth daily as needed for pain.     . Multiple Vitamin (MULTIVITAMIN WITH MINERALS) TABS tablet Take 1 tablet by mouth daily. One-A-Day Multivitamin for Women 50+    .  omeprazole (PRILOSEC) 40 MG capsule Take 40 mg by mouth daily.    . Ascorbic Acid (VITAMIN C WITH ROSE HIPS) 1000 MG tablet Take 1,000 mg by mouth 4 (four) times daily.    . Ascorbic Acid (VITAMIN C) 1000 MG tablet Take by mouth.    [name] atorvastatin (LIPITOR) 40 MG tablet Take 1 tablet (40 mg total) by mouth daily at 6 PM. (Patient not taking: Reported on 10/03/2017) 30 tablet 2  . atorvastatin (LIPITOR) 40 MG tablet atorvastatin 40 mg tablet    . diazepam (VALIUM) 2 MG tablet     . diclofenac (VOLTAREN) 75 MG EC tablet Take 75 mg by mouth 2 (two) times daily.     . diphenhydrAMINE (BENADRYL) 25 MG tablet Take 25 mg by mouth every 6 (six) hours as needed for itching.    . fluconazole (DIFLUCAN) 150 MG tablet Take 150 mg by mouth daily.    [name] HYDROcodone-acetaminophen (NORCO/VICODIN) 5-325 MG tablet Take 1 tablet by mouth every 6 (six) hours as needed for moderate pain.    [name] HYDROmorphone (DILAUDID) 2 MG tablet Take 1-2 tablets (2-4 mg total) by mouth every 4 (four) hours as needed for severe pain. (Patient not taking: Reported on 11/14/2016) 60 tablet 0  . meclizine (ANTIVERT) 25 MG tablet Take 1 tablet (25 mg total) by mouth 3 (three) times daily as needed for dizziness. (Patient not taking: Reported on 11/19/2017) 20 tablet 0  . Melatonin 5 MG CAPS Take by mouth daily.    . ranitidine (ZANTAC) 150 MG tablet Take 150 mg by mouth daily as needed for heartburn.     No current facility-administered medications for this visit.      PHYSICAL EXAMINATION: ECOG PERFORMANCE STATUS: 2 - Symptomatic, <50% confined to bed Vitals:   11/19/17 1334  BP: 117/70  Pulse: (!) 51  Resp: 18  Temp: 97.6 F (36.4 C)  SpO2: 98%   Filed Weights   11/19/17 1334  Weight: 175 lb 1.6 oz (79.4 kg)    Physical Exam  Constitutional: She is oriented to person, place, and time. She appears well-developed and well-nourished. No distress.  HENT:  Head: Normocephalic and atraumatic.  Right Ear: External ear normal.   Left Ear: External ear normal.  Mouth/Throat: Oropharynx is clear and moist.  Eyes: Pupils are equal, round, and reactive to light. Conjunctivae and EOM are normal. No scleral icterus.  Neck: Normal range of motion. Neck supple.  Cardiovascular: Normal rate, regular rhythm and normal heart sounds.  Pulmonary/Chest: Effort normal and breath sounds normal. No respiratory distress. She has no wheezes. She has no rales. She exhibits no tenderness.  Abdominal: Soft. Bowel sounds are normal. She exhibits no distension and no mass. There is no tenderness.  Musculoskeletal: Normal range of motion. She exhibits no edema or deformity.  Lymphadenopathy:    She has no cervical adenopathy.  Neurological: She is alert and oriented to person, place, and time. No cranial nerve deficit. Coordination normal.  Skin: Skin is warm and dry. No rash noted.  Psychiatric: Her behavior is normal.     LABORATORY DATA:  I have reviewed the data as listed Lab Results  Component Value Date   WBC 5.7 11/14/2017   HGB 10.3 (L) 11/14/2017   HCT 32.3 (L) 11/14/2017   MCV 74.4 (L) 11/14/2017   PLT 344 11/14/2017   Recent Labs    02/15/17 1311 10/03/17 1214  NA 135 133*  K 3.4* 3.8  CL 102 102  CO2 23 23  GLUCOSE 133* 95  BUN 10 10  CREATININE 0.89 0.87  CALCIUM 8.9 8.8*  GFRNONAA >60 >60  GFRAA >60 >60  PROT  --  6.7  ALBUMIN  --  3.6  AST  --  14*  ALT  --  7*  ALKPHOS  --  81  BILITOT  --  0.2*       ASSESSMENT & PLAN:  1. Iron deficiency anemia, unspecified iron deficiency anemia type   2. MGUS (monoclonal gammopathy of unknown significance)    #Iron deficiency anemia: Labs reviewed and discussed with patient. Despite improvement of her iron store and hemoglobin, patient continued to feel profound fatigue. She also appears to be quite emotional today.  She tells me that she is overwhelmed and she is frustrated with all her problems. I discussed with patient that we can hold additional IV  iron for now, start her on oral ferrous sulfate 325 twice a day with meal Repeat CBC, iron TIBC ferritin in 3 months Patient feels relieved that she does not have to have another IV iron infusion today and willing to try oral iron supplementation. Discussed with patient that fatigue can be caused by multiple conditions.  Advised patient to closely follow-up with primary care physician and other specialist for further evaluation.   #MGUS: IgG lambda, stable.  Will repeat multiple myeloma labs twice a year. Recent SPEP  M spike 0.2, normal light chain ratio   # Thrombocytosis, resolved after iron panel improved.  Likely related to iron deficiency.  All questions were answered. The patient knows to call the clinic with any problems questions or concerns.  Return of visit: 3 months   Earlie Server, MD, PhD Hematology Oncology [facility] [place] Pager- [phone] 11/19/2017

## 2017-11-19 NOTE — Progress Notes (Signed)
No venofer needed today per MD.

## 2017-11-19 NOTE — Progress Notes (Signed)
Patient here for follow up. No concerns voiced.  °

## 2017-12-17 ENCOUNTER — Ambulatory Visit (INDEPENDENT_AMBULATORY_CARE_PROVIDER_SITE_OTHER): Payer: Medicare Other | Admitting: Orthotics

## 2017-12-17 DIAGNOSIS — F40298 Other specified phobia: Secondary | ICD-10-CM | POA: Diagnosis not present

## 2017-12-17 DIAGNOSIS — Z8673 Personal history of transient ischemic attack (TIA), and cerebral infarction without residual deficits: Secondary | ICD-10-CM

## 2017-12-17 DIAGNOSIS — M2042 Other hammer toe(s) (acquired), left foot: Secondary | ICD-10-CM

## 2017-12-17 DIAGNOSIS — M2041 Other hammer toe(s) (acquired), right foot: Secondary | ICD-10-CM

## 2017-12-17 DIAGNOSIS — Z9181 History of falling: Secondary | ICD-10-CM | POA: Diagnosis not present

## 2017-12-30 ENCOUNTER — Ambulatory Visit (INDEPENDENT_AMBULATORY_CARE_PROVIDER_SITE_OTHER): Payer: Medicare Other | Admitting: Podiatry

## 2017-12-30 ENCOUNTER — Encounter: Payer: Self-pay | Admitting: Podiatry

## 2017-12-30 DIAGNOSIS — B351 Tinea unguium: Secondary | ICD-10-CM

## 2017-12-30 DIAGNOSIS — L989 Disorder of the skin and subcutaneous tissue, unspecified: Secondary | ICD-10-CM

## 2017-12-30 DIAGNOSIS — M79676 Pain in unspecified toe(s): Secondary | ICD-10-CM

## 2018-01-01 NOTE — Progress Notes (Signed)
    Subjective: Patient is a 67 y.o. female presenting to the office today with a chief complaint of a painful callus lesion to the bilateral third toes that have been present for the past several years. Walking and bearing weight increases the pain. She has not done anything at home for treatment.   Patient also complains of elongated, thickened nails that cause pain while ambulating in shoes. She is unable to trim her own nails. Patient presents today for further treatment and evaluation.  Past Medical History:  Diagnosis Date  . Arthritis   . Bipolar disorder ([place])   . 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 Blood clot in vein   . Cancer ([place])    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  . Carotid arterial disease ([place])   . Contracture of joint of upper arm   . Elevated lipids   . GERD (gastroesophageal reflux disease)   . Hypertension   . Osteoporosis   . Poor balance   . Sinus congestion   . 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 TIA (transient ischemic attack)     Objective:  Physical Exam General: Alert and oriented x3 in no acute distress  Dermatology: Hyperkeratotic lesion present on the bilateral third toes. Pain on palpation with a central nucleated core noted. Skin is warm, dry and supple bilateral lower extremities. Negative for open lesions or macerations. Nails are tender, long, thickened and dystrophic with subungual debris, consistent with onychomycosis, 1-5 bilateral. No signs of infection noted.  Vascular: Palpable pedal pulses bilaterally. No edema or erythema noted. Capillary refill within normal limits.  Neurological: Epicritic and protective threshold grossly intact bilaterally.   Musculoskeletal Exam: Pain on palpation at the keratotic lesion noted. Range of motion within normal limits bilateral. Muscle strength 5/5 in all groups bilateral.  Assessment: 1. Onychodystrophic nails 1-5 bilateral with hyperkeratosis of nails.  2. Onychomycosis of nail due to dermatophyte bilateral 3. Pre-ulcerative  callus lesions noted to the bilateral 3rd toes   Plan of Care:  1. Patient evaluated. 2. Excisional debridement of keratoic lesion using a chisel blade was performed without incident.  3. Dressed with light dressing. 4. Mechanical debridement of nails 1-5 bilaterally performed using a nail nipper. Filed with dremel without incident.  5. Patient is to return to the clinic in 3 months.   Edrick Kins, DPM [facility]  Dr. Edrick Kins, [place] [address]                                        [place], [place] [address]                Office [phone]  Fax [phone]

## 2018-01-20 ENCOUNTER — Encounter

## 2018-01-20 ENCOUNTER — Encounter: Payer: Self-pay | Admitting: Neurology

## 2018-01-20 ENCOUNTER — Ambulatory Visit (INDEPENDENT_AMBULATORY_CARE_PROVIDER_SITE_OTHER): Payer: Medicare Other | Admitting: Neurology

## 2018-01-20 DIAGNOSIS — G44309 Post-traumatic headache, unspecified, not intractable: Secondary | ICD-10-CM

## 2018-01-20 MED ORDER — METOCLOPRAMIDE HCL 5 MG PO TABS: 5.0000 mg | ORAL_TABLET | Freq: Four times a day (QID) | ORAL | 6 refills | Status: DC | PRN

## 2018-01-20 NOTE — Progress Notes (Addendum)
[facility]    Provider:  Dr Jaynee Eagles Referring Provider: Danelle Berry, NP Primary Care Physician:  Danelle Berry, NP  CC:  Head pain  HPI:  Sydney Evans is a 67 y.o. female here as requested by Dr. Charlynn Grimes for migraines.  She has a past medical history of anxiety, Barrett's esophagus, hypertension, migraine, MGUS, persistent insomnia, cerebrovascular accident, vertigo, vitamin B12 deficiency,. Very poor historian. She has episodes of soreness and pressure all over the head since concussion 8 months ago. Sh ehas a history of migraines as a child. She gets a sharp pain that shoots in her head various different spots more on the left side of the head. Ca be all over on the sides in the back and it hurts. It can last days. It can be moderate to severe. She denies increased light, sound sensitivity with the headaches (she has glaucoma and light in general hurts her). Some nausea. No vomiting. She claims she has 20 headache days a month. Can be unilateral. She takes ibuprofen 2x a day almost every day.  No vision changes, no signs of GCA. She declines any further imaging.   Reviewed notes, labs and imaging from outside physicians, which showed:  CT head 05/30/2017:  Reviewed imaging and agree with the following 1. No evidence of intracranial injury. 2. Small remote right cerebral infarcts.  Reviewed referring physician's notes Margurite Auerbach.  Patient's on Fioricet and ibuprofen it appears for acute management also meclizine.  She takes citalopram hydrochlorothiazide lisinopril and Lamictal.  She has been diagnosed with B12 deficiency and follows with her primary care, she is been to endocrinology for elevated TSH, she has a neurologist at [facility], she would like to have balance therapy, migraines are worsening in frequency and intensity she has elevated blood pressure 170/92.  Reviewed notes from [place] it appears that she sees neurology for imbalance which is the primary  diagnosis.  She was evaluated for peripheral neuropathy with blood test, EMG nerve conduction study was performed.  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  She was sent to physical therapy for this.  She also had a series of tests there including can profile, CBC, diabetes, immunoglobulins, autoimmune panel and other hematology.  Sed rate was 13.  ANA was negative.  Beta-2 microglobulin 2.15, CRP in the past normal, folic acid, vitamin E, copper.  Vitamin B12 291 in November 2018.  She was diagnosed with MGUS.  Apparently she had an EEG completed with [facility] for 120 hours.  She has new headaches that are not migraines in nature that feels sharp, last head CT done in the office was normal.  She sees hematology for iron transfusion.  Reviewed labs CMP Sep 17, 2017 showed BUN 7 and creatinine is 0.79 otherwise unremarkable, CBC also unremarkable, LDL was elevated at 138, TSH 4.79, vitamin D hydroxy 2538.7, vitamin B12 314.   Review of Systems: Patient complains of symptoms per HPI as well as the following symptoms imbalance, headache. Pertinent negatives and positives per HPI. All others negative.   Social History   Socioeconomic History  . Marital status: Single    Spouse name: Not on file  . Number of children: 3  . Years of education: Not on file  . Highest education  level: Some college, no degree  Occupational History  . Not on file  Social Needs  . Financial resource strain: Not on file  . Food insecurity:    Worry: Not on file    Inability: Not on file  . Transportation needs:    Medical: Not on file    Non-medical: Not on file  Tobacco Use  . Smoking status: Former Smoker     Packs/day: 0.25    Types: Cigarettes    Last attempt to quit: 08/04/2016    Years since quitting: 1.4  . Smokeless tobacco: Never Used  Substance and Sexual Activity  . Alcohol use: No  . Drug use: No  . Sexual activity: Not on file  Lifestyle  . Physical activity:    Days per week: Not on file    Minutes per session: Not on file  . Stress: Not on file  Relationships  . Social connections:    Talks on phone: Not on file    Gets together: Not on file    Attends religious service: Not on file    Active member of club or organization: Not on file    Attends meetings of clubs or organizations: Not on file    Relationship status: Not on file  . Intimate partner violence:    Fear of current or ex partner: Not on file    Emotionally abused: Not on file    Physically abused: Not on file    Forced sexual activity: Not on file  Other Topics Concern  . Not on file  Social History Narrative   Lives at home alone in an apt   Right handed   Disabled since 1998   Caffeine: about 30 oz daily    Family History  Problem Relation Age of Onset  . Other Mother        ?lupus   . Cancer Mother   . High Cholesterol Mother   . CAD Father        CABG  . High Cholesterol Father   . Arthritis Father   . Cancer Sister   . Heart murmur Sister   . Heart murmur Brother   . High blood pressure Other        "for everybody"  . High Cholesterol Other        "for everybody"    Past Medical History:  Diagnosis Date  . Anxiety   . Arthritis   . Barrett's esophagus   . Bipolar disorder ([place])   . 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 Blood clot in vein   . Cancer ([place])    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  . Carotid arterial disease ([place])   . Contracture of joint of upper arm   . Depression   . Elevated lipids   . GERD (gastroesophageal reflux disease)   . Hypertension   . Migraine   . Osteoporosis   . Poor balance   . Sinus congestion   . Stroke ([place])    x 2  . TIA (transient ischemic attack)   .  Vertigo     Past Surgical History:  Procedure Laterality Date  . ABDOMINAL HYSTERECTOMY    . BACK SURGERY    . BREAST BIOPSY Left   . CARPAL TUNNEL RELEASE Bilateral   . CATARACT EXTRACTION W/ INTRAOCULAR LENS  IMPLANT, BILATERAL Bilateral   . CHOLECYSTECTOMY    . crystal cyst removed on left foot    . femoral fx    .  FOOT SURGERY    . KNEE SURGERY    . TONSILLECTOMY    . TOTAL KNEE ARTHROPLASTY Bilateral   . TOTAL SHOULDER ARTHROPLASTY Left 08/01/2016   Procedure: TOTAL SHOULDER ARTHROPLASTY;  Surgeon: Corky Mull, MD;  Location: [facility] ORS;  Service: Orthopedics;  Laterality: Left;    Current Outpatient Medications  Medication Sig Dispense Refill  . aspirin EC 325 MG tablet Take 1 tablet (325 mg total) by mouth daily. 30 tablet 3  . atorvastatin (LIPITOR) 40 MG tablet Take 1 tablet (40 mg total) by mouth daily at 6 PM. 30 tablet 2  . Carboxymethylcellulose Sodium (LUBRICANT EYE DROPS OP) Place 1 drop into both eyes daily.    . citalopram (CELEXA) 40 MG tablet Take 40 mg by mouth daily.    . fluticasone (FLONASE) 50 MCG/ACT nasal spray Place 2 sprays into both nostrils daily as needed for allergies or rhinitis.    . hydrochlorothiazide (HYDRODIURIL) 25 MG tablet Take 25 mg by mouth daily.    . IBU 800 MG tablet     . lamoTRIgine (LAMICTAL) 200 MG tablet Take 200 mg by mouth 2 (two) times daily.    [name] latanoprost (XALATAN) 0.005 % ophthalmic solution Place 2 drops into both eyes at bedtime.     [name] lisinopril (PRINIVIL,ZESTRIL) 40 MG tablet Take 40 mg by mouth daily.    . Multiple Vitamin (MULTIVITAMIN WITH MINERALS) TABS tablet Take 1 tablet by mouth daily. One-A-Day Multivitamin for Women 50+    . omeprazole (PRILOSEC) 40 MG capsule Take 40 mg by mouth daily.    . diphenhydrAMINE (BENADRYL) 25 MG tablet Take 25 mg by mouth every 6 (six) hours as needed for itching.    . metoCLOPramide (REGLAN) 5 MG tablet Take 1 tablet (5 mg total) by mouth 4 (four) times daily as needed for nausea.  May also take for headache. 30 tablet 6   No current facility-administered medications for this visit.     Allergies as of 01/20/2018 - Review Complete 01/20/2018  Allergen Reaction Noted  . Hydroxyzine Hives and Rash 01/20/2018  . Gabapentin  08/31/2015  . Ketorolac Other (See Comments)   . Hydrocodone Hives and Rash 07/22/2016  . Percocet [oxycodone-acetaminophen] Itching and Rash 12/22/2014  . Toradol [ketorolac tromethamine] Rash 08/01/2016    Vitals: BP (!) 158/76 (BP Location: Right Arm, Patient Position: Sitting)   Pulse (!) 44   Ht 5\' 2"  (1.575 m)   Wt 168 lb (76.2 kg)   BMI 30.73 kg/m  Last Weight:  Wt Readings from Last 1 Encounters:  01/20/18 168 lb (76.2 kg)   Last Height:   Ht Readings from Last 1 Encounters:  01/20/18 5\' 2"  (1.575 m)    Physical exam: Exam: Gen: NAD, conversant, well nourised, obese                 CV: RRR, no MRG. No Carotid Bruits. No peripheral edema, warm, nontender Eyes: Conjunctivae clear without exudates or hemorrhage  Neuro: Detailed Neurologic Exam  Speech:    Speech is normal; fluent and spontaneous with impaired comprehension.  Cognition:    The patient is oriented to person, place, and time;     recent and remote memory impaired;     language fluent;     Impaired attention, concentration, fund of knowledge Cranial Nerves:    The pupils are equal, round, and reactive to light. Attempted fundoscopic exam could not visualize. . Visual fields decreased slightly left periphery. Extraocular movements are intact. Trigeminal  sensation is intact and the muscles of mastication are normal. The face is symmetric. The palate elevates in the midline. Hearing intact. Voice is normal. Shoulder shrug is normal. The tongue has normal motion without fasciculations.   Coordination:    No dysmetria  Gait:no ataxia      Motor Observation:    No asymmetry, no atrophy, and no involuntary movements noted. Tone:    Normal muscle tone.     Posture:    Posture is normal. normal erect    Strength: Left deltoid 3/5 otherwise strength is intact in the upper and lower limbs.      Sensation: decreased distally to all modalities moreso on the left and distally in the LE, +Romberg     Reflex Exam:  DTR's:    Deep tendon reflexes in the upper and lower extremities are symmetric bilaterally.   Toes:    The toes are equiv bilaterally.   Clonus:    Clonus is absent.      Assessment/Plan: 67 year old patient here for headaches after concussion.    - Headaches started after a concussion, post-concussive headache.   - Medication overuse: She takes ibuprofen 2x a day up to 20 days a month. Discussed sequelae of taking ibuprofen at this frequency including medication rebound/overuse.  - She has an allergy to gabapentin, can;t take propranolol due to HR 44, triptans contraindicated, can try Reglan for headache discussed side effects especially Tardive Dyskinesias stop for this only take as needed for pain will also help with the nausea of a headache.   -It appears as though she has B12 deficiency I do recommend following up with PCP or [facility] physician as they have been the ones testing her to see if she needs supplementation discussed sequelae of untreated B12 deficiency. She takes injections per notes.   - She has a neurologist at [facility] for which she sees for imbalance and peripheral neuropathy will not examine these disorder she is just here for headaches she can follow-up with her [place] for imbalance  To prevent or relieve headaches, try the following: Cool Compress. Lie down and place a cool compress on your head.  Avoid headache triggers. If certain foods or odors seem to have triggered your migraines in the past, avoid them. A headache diary might help you identify triggers.  Include physical activity in your daily routine. Try a daily walk or other moderate aerobic exercise.  Manage stress. Find healthy ways to cope  with the stressors, such as delegating tasks on your to-do list.  Practice relaxation techniques. Try deep breathing, yoga, massage and visualization.  Eat regularly. Eating regularly scheduled meals and maintaining a healthy diet might help prevent headaches. Also, drink plenty of fluids.  Follow a regular sleep schedule. Sleep deprivation might contribute to headaches Consider biofeedback. With this mind-body technique, you learn to control certain bodily functions - such as muscle tension, heart rate and blood pressure - to prevent headaches or reduce headache pain.    Proceed to emergency room if you experience new or worsening symptoms or symptoms do not resolve, if you have new neurologic symptoms or if headache is severe, or for any concerning symptom.   Provided education and documentation from American headache Society toolbox including articles on: chronic migraine medication overuse headache, chronic migraines, prevention of migraines, behavioral and other nonpharmacologic treatments for headache.    Sarina Ill, MD  [facility] [address] [place], [place] [address]  Phone [phone] Fax [phone]

## 2018-01-20 NOTE — Patient Instructions (Signed)
Metoclopramide tablets What is this medicine? METOCLOPRAMIDE (met oh kloe PRA mide) is used to treat the symptoms of gastroesophageal reflux disease (GERD) like heartburn. It is also used to treat people with slow emptying of the stomach and intestinal tract. This medicine may be used for other purposes; ask your health care provider or pharmacist if you have questions. COMMON BRAND NAME(S): Reglan What should I tell my health care provider before I take this medicine? They need to know if you have any of these conditions: -breast cancer -depression -diabetes -heart failure -high blood pressure -kidney disease -liver disease -Parkinson's disease or a movement disorder -pheochromocytoma -seizures -stomach obstruction, bleeding, or perforation -an unusual or allergic reaction to metoclopramide, procainamide, sulfites, other medicines, foods, dyes, or preservatives -pregnant or trying to get pregnant -breast-feeding How should I use this medicine? Take this medicine by mouth with a glass of water. Follow the directions on the prescription label. Take this medicine on an empty stomach, about 30 minutes before eating. Take your doses at regular intervals. Do not take your medicine more often than directed. Do not stop taking except on the advice of your doctor or health care professional. A special MedGuide will be given to you by the pharmacist with each prescription and refill. Be sure to read this information carefully each time. Talk to your pediatrician regarding the use of this medicine in children. Special care may be needed. Overdosage: If you think you have taken too much of this medicine contact a poison control center or emergency room at once. NOTE: This medicine is only for you. Do not share this medicine with others. What if I miss a dose? If you miss a dose, take it as soon as you can. If it is almost time for your next dose, take only that dose. Do not take double or extra  doses. What may interact with this medicine? -acetaminophen -cyclosporine -digoxin -medicines for blood pressure -medicines for diabetes, including insulin -medicines for hay fever and other allergies -medicines for depression, especially a Monoamine Oxidase Inhibitor (MAOI) -medicines for Parkinson's disease, like levodopa -medicines for sleep or for pain -quinidine -tetracycline This list may not describe all possible interactions. Give your health care provider a list of all the medicines, herbs, non-prescription drugs, or dietary supplements you use. Also tell them if you smoke, drink alcohol, or use illegal drugs. Some items may interact with your medicine. What should I watch for while using this medicine? It may take a few weeks for your stomach condition to start to get better. However, do not take this medicine for longer than 12 weeks. The longer you take this medicine, and the more you take it, the greater your chances are of developing serious side effects. If you are an elderly patient, a female patient, or you have diabetes, you may be at an increased risk for side effects from this medicine. Contact your doctor immediately if you start having movements you cannot control such as lip smacking, rapid movements of the tongue, involuntary or uncontrollable movements of the eyes, head, arms and legs, or muscle twitches and spasms. Patients and their families should watch out for worsening depression or thoughts of suicide. Also watch out for any sudden or severe changes in feelings such as feeling anxious, agitated, panicky, irritable, hostile, aggressive, impulsive, severely restless, overly excited and hyperactive, or not being able to sleep. If this happens, especially at the beginning of treatment or after a change in dose, call your doctor. Do not treat   yourself for high fever. Ask your doctor or health care professional for advice. You may get drowsy or dizzy. Do not drive, use  machinery, or do anything that needs mental alertness until you know how this drug affects you. Do not stand or sit up quickly, especially if you are an older patient. This reduces the risk of dizzy or fainting spells. Alcohol can make you more drowsy and dizzy. Avoid alcoholic drinks. What side effects may I notice from receiving this medicine? Side effects that you should report to your doctor or health care professional as soon as possible: -allergic reactions like skin rash, itching or hives, swelling of the face, lips, or tongue -abnormal production of milk in females -breast enlargement in both males and females -change in the way you walk -difficulty moving, speaking or swallowing -drooling, lip smacking, or rapid movements of the tongue -excessive sweating -fever -involuntary or uncontrollable movements of the eyes, head, arms and legs -irregular heartbeat or palpitations -muscle twitches and spasms -unusually weak or tired Side effects that usually do not require medical attention (report to your doctor or health care professional if they continue or are bothersome): -change in sex drive or performance -depressed mood -diarrhea -difficulty sleeping -headache -menstrual changes -restless or nervous This list may not describe all possible side effects. Call your doctor for medical advice about side effects. You may report side effects to FDA at 1-800-FDA-1088. Where should I keep my medicine? Keep out of the reach of children. Store at room temperature between 20 and 25 degrees C (68 and 77 degrees F). Protect from light. Keep container tightly closed. Throw away any unused medicine after the expiration date. NOTE: This sheet is a summary. It may not cover all possible information. If you have questions about this medicine, talk to your doctor, pharmacist, or health care provider.  2018 Elsevier/Gold Standard (2016-02-07 15:13:45)

## 2018-01-26 ENCOUNTER — Telehealth: Payer: Self-pay | Admitting: Neurology

## 2018-01-26 NOTE — Telephone Encounter (Signed)
Pt states she was advised to be seen by Neurologist @ [facility] but pt states she is not satisfied with  Neurologist @ [place].  Pt states she's like to stay with Dr Jaynee Eagles.  Please call

## 2018-01-26 NOTE — Telephone Encounter (Signed)
I reviewed her notes from [place] and unfortunately I don't have anymore to add to her peripheral neuropathy and imbalance symptoms, she has been evalauated with imaging and labs and emg/ncs already at [facility]. Sorry, I really don;t have anymore to do for her. [facility] sent her to PT and also sent her to Onology for MGUS. I will continue to treat her headaches but she can see her pcp for her imbalance as I have no more to add than the [place]. Primary care can take it from here and she likely needs therapy and balance training. thanks

## 2018-01-27 NOTE — Telephone Encounter (Addendum)
Spoke with patient and gave her Dr. Cathren Laine full message. She verbalized understanding and stated that imbalance and peripheral neuropathy are under control. She stated she was sent to another doctor by per PCP and now has been given braces and special shoes. She stated she has her balance back. She can walk up stairs, bend, etc. She stated she has pressure in different areas of her head. She has had concussions in the past. She said her PCP also sent her to psychiatry just in case that had anything to do with her migraines. She has not seen them yet. She said she has taken maybe 4 Reglan since Friday, last taken last night. Pt was advised if she has headache and nausea right now to go ahead and take one and then repeat at bedtime if needed. Discussed with pt the instructions and frequency that she can take as needed. Pt verbalized understanding. She wanted to know when Dr. Jaynee Eagles wants her to follow up. RN advised that she would check with MD and call her back. Pt verbalized appreciation.

## 2018-01-27 NOTE — Telephone Encounter (Signed)
She has to stop taking the daily ibuprofen, as discussed this is what may be causing her headaches. She should follow up with me 4-8 weeks after stopping all Ibuprofen (or tylenol, goodys, alleve, any OTC pain med) and then we can see how her headaches are and what we can do to treat them. Right now we can't tell what is medication overuse/reboud or what is migraines. Her headaches may significantly improve 4-6 weeks after stopping all OTC meds including ibuprofen which she takes daily 2x a day. thanks

## 2018-01-28 NOTE — Telephone Encounter (Addendum)
Spoke with patient and advised her of Dr. Cathren Laine entire message. She stated that she takes the Ibuprofen 800 mg for her arthritis pain in multiple areas of her body but has used it for her head.  She said the only time she takes it is when she can't stand it anymore which can be 1-2 times day. She said she has gone days without taking the Ibuprofen. She said she uses a golfball for her feet, playdough for her hands and tries these alternate therapies for her arthritis, relaxation tapes, and see if these will help. She said she will not take anymore Ibuprofen starting tomorrow. Pt was strongly advised to talk to her PCP about other options for arthritic pain rather than the Ibuprofen as this may be what is causing the headaches. Pt verbalized understanding and appreciation. She was scheduled for a follow up on wed 03/18/18 @ 09:30 arrival 09:00. Pt was given the option of 11/7 but she was unable to come at available time.

## 2018-02-16 ENCOUNTER — Encounter: Payer: Self-pay | Admitting: Emergency Medicine

## 2018-02-16 ENCOUNTER — Emergency Department
Admission: EM | Admit: 2018-02-16 | Discharge: 2018-02-16 | Disposition: A | Discharge status: Home / Self Care | Payer: Medicare Other | Attending: Emergency Medicine | Admitting: Emergency Medicine

## 2018-02-16 ENCOUNTER — Other Ambulatory Visit: Payer: Self-pay

## 2018-02-16 DIAGNOSIS — Z96653 Presence of artificial knee joint, bilateral: Secondary | ICD-10-CM | POA: Diagnosis not present

## 2018-02-16 DIAGNOSIS — I10 Essential (primary) hypertension: Secondary | ICD-10-CM | POA: Diagnosis not present

## 2018-02-16 DIAGNOSIS — Z96612 Presence of left artificial shoulder joint: Secondary | ICD-10-CM | POA: Insufficient documentation

## 2018-02-16 DIAGNOSIS — Z79899 Other long term (current) drug therapy: Secondary | ICD-10-CM | POA: Insufficient documentation

## 2018-02-16 DIAGNOSIS — R51 Headache: Secondary | ICD-10-CM | POA: Insufficient documentation

## 2018-02-16 DIAGNOSIS — Z7982 Long term (current) use of aspirin: Secondary | ICD-10-CM | POA: Diagnosis not present

## 2018-02-16 DIAGNOSIS — Z87891 Personal history of nicotine dependence: Secondary | ICD-10-CM | POA: Diagnosis not present

## 2018-02-16 DIAGNOSIS — R519 Headache, unspecified: Secondary | ICD-10-CM

## 2018-02-16 MED ORDER — METOCLOPRAMIDE HCL 5 MG/ML IJ SOLN
10.0000 mg | Freq: Once | INTRAMUSCULAR | Status: AC
  Administered 2018-02-16: 10 mg via INTRAMUSCULAR

## 2018-02-16 MED ORDER — METOCLOPRAMIDE HCL 5 MG/ML IJ SOLN
10.0000 mg | Freq: Once | INTRAMUSCULAR | Status: DC
  Filled 2018-02-16: qty 2

## 2018-02-16 MED ORDER — DIPHENHYDRAMINE HCL 25 MG PO CAPS
50.0000 mg | ORAL_CAPSULE | Freq: Once | ORAL | Status: AC
  Administered 2018-02-16: 50 mg via ORAL
  Filled 2018-02-16: qty 2

## 2018-02-16 MED ORDER — PREDNISONE 20 MG PO TABS
40.0000 mg | ORAL_TABLET | ORAL | Status: AC
  Administered 2018-02-16: 40 mg via ORAL
  Filled 2018-02-16: qty 2

## 2018-02-16 NOTE — ED Provider Notes (Addendum)
[facility] Emergency Department Provider Note  ____________________________________________  Time seen: Approximately 4:59 PM  I have reviewed the triage vital signs and the nursing notes.   HISTORY  Chief Complaint Headache    HPI Sydney Evans is a 67 y.o. female who complains of recurrent headache for the past 8 months.  She is been getting this headache ever since a mechanical fall caused her to have a concussion in January 2019.  Since that time she has had multiple tests and seen multiple specialists, including having a CT scan of her head, EMG, EEG, follow-up with neurology, ophthalmology, and ENT.  Her results have been nondiagnostic thus far.  Her most recent neurology visit 1 month ago noted that they are worried this might be rebound headache due to her daily ibuprofen use.  Patient states that she was not able to discontinue the ibuprofen due to her severe arthritis pain and so she resumed taking it just a few days  after that clinic visit.  She also reports she has not been taking the metoclopramide that her neurologist prescribed for her because they said to take it "as needed," so she does not take it unless she has severe pain at which point she feels like it does not help.  Denies any new falls or trauma, weakness, paresthesias, or vision changes.  No vomiting or other new symptoms.  Headaches are intermittent, sharp, migratory and different areas of the head, severe, lasting a few seconds at a time, nonradiating.   Past Medical History:  Diagnosis Date  . Anxiety   . Arthritis   . Barrett's esophagus   . Bipolar disorder ([place])   . 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 Blood clot in vein   . Cancer ([place])    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  . Carotid arterial disease ([place])   . Contracture of joint of upper arm   . Depression   . Elevated lipids   . GERD (gastroesophageal reflux disease)   . Hypertension   . Migraine   . Osteoporosis   . Poor balance   .  Sinus congestion   . 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 TIA (transient ischemic attack)   . Vertigo      Patient Active Problem List   Diagnosis Date Noted  . Post-concussion headache 01/20/2018  . Iron deficiency anemia 10/05/2017  . Carotid stenosis 11/17/2016  . GERD (gastroesophageal reflux disease) 11/17/2016  . Essential hypertension 11/17/2016  . Hyperlipidemia 11/17/2016  . 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 Rotator cuff tendinitis, left 05/20/2016  . History of TIAs 07/17/2015  . 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 TIA (transient ischemic attack) 12/23/2014  . 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 Disorder of bone and articular cartilage 09/29/2014     Past Surgical History:  Procedure Laterality Date  . ABDOMINAL HYSTERECTOMY    . BACK SURGERY    . BREAST BIOPSY Left   . CARPAL TUNNEL RELEASE Bilateral   . CATARACT EXTRACTION W/ INTRAOCULAR LENS  IMPLANT, BILATERAL Bilateral   . CHOLECYSTECTOMY    . crystal cyst removed on left foot    . femoral fx    . FOOT SURGERY    . KNEE SURGERY    . TONSILLECTOMY    . TOTAL KNEE ARTHROPLASTY Bilateral   . TOTAL SHOULDER ARTHROPLASTY Left 08/01/2016   Procedure: TOTAL SHOULDER ARTHROPLASTY;  Surgeon: Corky Mull, MD;  Location:  [facility] ORS;  Service: Orthopedics;  Laterality: Left;     Prior to Admission medications   Medication Sig Start Date End Date Taking? Authorizing Provider  aspirin EC 325 MG tablet Take 1 tablet (325 mg total) by mouth daily. 12/23/14   Gladstone Lighter, MD  atorvastatin (LIPITOR) 40 MG tablet Take 1 tablet (40 mg total) by mouth daily at 6 PM. 12/23/14   Gladstone Lighter, MD  Carboxymethylcellulose Sodium (LUBRICANT EYE DROPS OP) Place 1 drop into both eyes daily.    [provider]  citalopram (CELEXA) 40 MG tablet Take 40 mg by mouth daily.    [provider]  diphenhydrAMINE (BENADRYL) 25 MG tablet Take 25 mg by mouth  every 6 (six) hours as needed for itching.    [provider]  fluticasone (FLONASE) 50 MCG/ACT nasal spray Place 2 sprays into both nostrils daily as needed for allergies or rhinitis.    [provider]  hydrochlorothiazide (HYDRODIURIL) 25 MG tablet Take 25 mg by mouth daily. 01/16/18   [provider]  IBU 800 MG tablet  09/25/17   [provider]  lamoTRIgine (LAMICTAL) 200 MG tablet Take 200 mg by mouth 2 (two) times daily.    [provider]  latanoprost (XALATAN) 0.005 % ophthalmic solution Place 2 drops into both eyes at bedtime.     [provider]  lisinopril (PRINIVIL,ZESTRIL) 40 MG tablet Take 40 mg by mouth daily.    [provider]  metoCLOPramide (REGLAN) 5 MG tablet Take 1 tablet (5 mg total) by mouth 4 (four) times daily as needed for nausea. May also take for headache. 01/20/18   Melvenia Beam, MD  Multiple Vitamin (MULTIVITAMIN WITH MINERALS) TABS tablet Take 1 tablet by mouth daily. One-A-Day Multivitamin for Women 50+    [provider]  omeprazole (PRILOSEC) 40 MG capsule Take 40 mg by mouth daily.    [provider]     Allergies Hydroxyzine; Gabapentin; Ketorolac; Hydrocodone; Percocet [oxycodone-acetaminophen]; and Toradol [ketorolac tromethamine]   Family History  Problem Relation Age of Onset  . Other Mother        ?lupus   . Cancer Mother   . High Cholesterol Mother   . CAD Father        CABG  . High Cholesterol Father   . Arthritis Father   . Cancer Sister   . Heart murmur Sister   . Heart murmur Brother   . High blood pressure Other        "for everybody"  . High Cholesterol Other        "for everybody"    Social History Social History   Tobacco Use  . Smoking status: Former Smoker    Packs/day: 0.25    Types: Cigarettes    Last attempt to quit: 08/04/2016    Years since quitting: 1.5  . Smokeless tobacco: Never Used  Substance Use Topics  . Alcohol use: No  .  Drug use: No    Review of Systems  Constitutional:   No fever or chills.  ENT:   No sore throat. No rhinorrhea. Cardiovascular:   No chest pain or syncope. Respiratory:   No dyspnea or cough. Gastrointestinal:   Negative for abdominal pain, vomiting and diarrhea.  Musculoskeletal:   Negative for focal pain or swelling All other systems reviewed and are negative except as documented above in ROS and HPI.  ____________________________________________   PHYSICAL EXAM:  VITAL SIGNS: ED Triage Vitals  Enc Vitals Group  BP 02/16/18 1406 (!) 197/82     Pulse Rate 02/16/18 1406 80     Resp 02/16/18 1406 20     Temp 02/16/18 1406 97.8 F (36.6 C)     Temp Source 02/16/18 1406 Oral     SpO2 02/16/18 1406 95 %     Weight 02/16/18 1407 166 lb (75.3 kg)     Height 02/16/18 1407 5\' 2"  (1.575 m)     Head Circumference --      Peak Flow --      Pain Score 02/16/18 1415 8     Pain Loc --      Pain Edu? --      Excl. in [place]? --     Vital signs reviewed, nursing assessments reviewed.   Constitutional:   Alert and oriented. Non-toxic appearance. Eyes:   Conjunctivae are normal. EOMI. PERRL. ENT      Head:   Normocephalic and atraumatic.  Normal temporal artery pulsations, nontender      Nose:   No congestion/rhinnorhea.       Mouth/Throat:   MMM, no pharyngeal erythema. No peritonsillar mass.       Neck:   No meningismus. Full ROM. Hematological/Lymphatic/Immunilogical:   No cervical lymphadenopathy. Cardiovascular:   RRR. Symmetric bilateral radial and DP pulses.  No murmurs. Cap refill less than 2 seconds. Respiratory:   Normal respiratory effort without tachypnea/retractions. Breath sounds are clear and equal bilaterally. No wheezes/rales/rhonchi. Gastrointestinal:   Soft and nontender. Non distended. There is no CVA tenderness.  No rebound, rigidity, or guarding. Musculoskeletal:   Normal range of motion in all extremities. No joint effusions.  No lower extremity tenderness.  No  edema. Neurologic:   Normal speech and language.  Motor grossly intact. No acute focal neurologic deficits are appreciated.  Skin:    Skin is warm, dry and intact. No rash noted.  No petechiae, purpura, or bullae.  ____________________________________________    LABS (pertinent positives/negatives) (all labs ordered are listed, but only abnormal results are displayed) Labs Reviewed - No data to display ____________________________________________   EKG    ____________________________________________    RADIOLOGY  No results found.  ____________________________________________   PROCEDURES Procedures  ____________________________________________    CLINICAL IMPRESSION / ASSESSMENT AND PLAN / ED COURSE  Pertinent labs & imaging results that were available during my care of the patient were reviewed by me and considered in my medical decision making (see chart for details).    Patient presents with recurrence of her chronic headache syndrome.  It appears she has not been compliant with the recommendations of her neurologist clinic from her visit 1 month ago.  I will give a migraine cocktail today, reinforced the recommendations provided to her by neurology to see if her symptoms can be managed by removal of NSAIDs from her daily regimen and adding Reglan with or without Benadryl..  No acute symptoms today.Considering the patient's symptoms, medical history, and physical examination today, I have low suspicion for ischemic stroke, intracranial hemorrhage, meningitis, encephalitis, carotid or vertebral dissection, venous sinus thrombosis, MS, intracranial hypertension, glaucoma, CRAO, CRVO, or temporal arteritis.    ----------------------------------------- 6:00 PM on 02/16/2018 -----------------------------------------  Headache resolved, eager to go home.  Blood pressure is improved with pain control.  Recommended she adhere to her doctors advised her medication  regimen and follow-up.   ____________________________________________   FINAL CLINICAL IMPRESSION(S) / ED DIAGNOSES    Final diagnoses:  Chronic daily headache     ED Discharge Orders  None      Portions of this note were generated with dragon dictation software. Dictation errors may occur despite best attempts at proofreading.    Carrie Mew, MD 02/16/18 1703    Carrie Mew, MD 02/16/18 [phone]

## 2018-02-16 NOTE — ED Notes (Signed)
First nurse Note: Pt reports that she has had a headache for the last several months, states that it is getting worse. Pt has service dog with her.

## 2018-02-16 NOTE — ED Triage Notes (Signed)
Pt reports that she has had a headache for the last several months, she reports that the light and sound make it worse. She states that the pain is getting worse as the time goes on. She reports that she has had to wear her sunglasses all the time. She has gone to her PMD, ENT and they have found nothing wrong. She has service dog with her.

## 2018-02-16 NOTE — Discharge Instructions (Addendum)
Take the metoclopramide that your doctor prescribed for you every 6 hours when you are having headaches.  It can be helpful to take 25 mg of Benadryl with this medication as well.  Avoid ibuprofen use, as this may be worsening your headache syndrome.

## 2018-02-16 NOTE — ED Notes (Signed)
Pt ambulatory to toilet without difficulty. 

## 2018-02-19 ENCOUNTER — Inpatient Hospital Stay: Payer: Medicare Other | Admitting: Oncology

## 2018-02-19 ENCOUNTER — Inpatient Hospital Stay: Payer: Medicare Other

## 2018-02-27 ENCOUNTER — Emergency Department
Admission: EM | Admit: 2018-02-27 | Discharge: 2018-02-27 | Disposition: A | Discharge status: Home / Self Care | Payer: Medicare Other | Attending: Emergency Medicine | Admitting: Emergency Medicine

## 2018-02-27 ENCOUNTER — Other Ambulatory Visit: Payer: Self-pay

## 2018-02-27 ENCOUNTER — Emergency Department: Payer: Medicare Other

## 2018-02-27 DIAGNOSIS — K529 Noninfective gastroenteritis and colitis, unspecified: Secondary | ICD-10-CM | POA: Diagnosis not present

## 2018-02-27 DIAGNOSIS — Z87891 Personal history of nicotine dependence: Secondary | ICD-10-CM | POA: Insufficient documentation

## 2018-02-27 DIAGNOSIS — Z8542 Personal history of malignant neoplasm of other parts of uterus: Secondary | ICD-10-CM | POA: Insufficient documentation

## 2018-02-27 DIAGNOSIS — Z7982 Long term (current) use of aspirin: Secondary | ICD-10-CM | POA: Insufficient documentation

## 2018-02-27 DIAGNOSIS — R51 Headache: Secondary | ICD-10-CM | POA: Diagnosis not present

## 2018-02-27 DIAGNOSIS — Z96653 Presence of artificial knee joint, bilateral: Secondary | ICD-10-CM | POA: Diagnosis not present

## 2018-02-27 DIAGNOSIS — I10 Essential (primary) hypertension: Secondary | ICD-10-CM | POA: Diagnosis not present

## 2018-02-27 DIAGNOSIS — Z96612 Presence of left artificial shoulder joint: Secondary | ICD-10-CM | POA: Diagnosis not present

## 2018-02-27 DIAGNOSIS — Z79899 Other long term (current) drug therapy: Secondary | ICD-10-CM | POA: Insufficient documentation

## 2018-02-27 DIAGNOSIS — R1032 Left lower quadrant pain: Secondary | ICD-10-CM | POA: Diagnosis present

## 2018-02-27 LAB — CBC WITH DIFFERENTIAL/PLATELET
ABS IMMATURE GRANULOCYTES: 0.06 10*3/uL (ref 0.00–0.07)
BASOS ABS: 0.1 10*3/uL (ref 0.0–0.1)
BASOS PCT: 1 %
EOS ABS: 0.2 10*3/uL (ref 0.0–0.5)
Eosinophils Relative: 2 %
HCT: 35.9 % — ABNORMAL LOW (ref 36.0–46.0)
Hemoglobin: 11.5 g/dL — ABNORMAL LOW (ref 12.0–15.0)
IMMATURE GRANULOCYTES: 1 %
Lymphocytes Relative: 25 %
Lymphs Abs: 1.7 10*3/uL (ref 0.7–4.0)
MCH: 27.3 pg (ref 26.0–34.0)
MCHC: 32 g/dL (ref 30.0–36.0)
MCV: 85.3 fL (ref 80.0–100.0)
Monocytes Absolute: 0.4 10*3/uL (ref 0.1–1.0)
Monocytes Relative: 6 %
NEUTROS ABS: 4.6 10*3/uL (ref 1.7–7.7)
NEUTROS PCT: 65 %
NRBC: 0 % (ref 0.0–0.2)
PLATELETS: 412 10*3/uL — AB (ref 150–400)
RBC: 4.21 MIL/uL (ref 3.87–5.11)
RDW: 14.6 % (ref 11.5–15.5)
WBC: 7.1 10*3/uL (ref 4.0–10.5)

## 2018-02-27 LAB — URINALYSIS, COMPLETE (UACMP) WITH MICROSCOPIC
BILIRUBIN URINE: NEGATIVE
Bacteria, UA: NONE SEEN
GLUCOSE, UA: NEGATIVE mg/dL
Hgb urine dipstick: NEGATIVE
KETONES UR: NEGATIVE mg/dL
LEUKOCYTES UA: NEGATIVE
Nitrite: NEGATIVE
PH: 7 (ref 5.0–8.0)
Protein, ur: NEGATIVE mg/dL
SPECIFIC GRAVITY, URINE: 1.003 — AB (ref 1.005–1.030)

## 2018-02-27 LAB — COMPREHENSIVE METABOLIC PANEL
ALT: 11 U/L (ref 0–44)
AST: 25 U/L (ref 15–41)
Albumin: 3.8 g/dL (ref 3.5–5.0)
Alkaline Phosphatase: 96 U/L (ref 38–126)
Anion gap: 10 (ref 5–15)
BUN: 8 mg/dL (ref 8–23)
CHLORIDE: 101 mmol/L (ref 98–111)
CO2: 25 mmol/L (ref 22–32)
Calcium: 8.7 mg/dL — ABNORMAL LOW (ref 8.9–10.3)
Creatinine, Ser: 0.69 mg/dL (ref 0.44–1.00)
GFR calc Af Amer: >60 mL/min (ref >60)
Glucose, Bld: 106 mg/dL — ABNORMAL HIGH (ref 70–99)
Potassium: 4.1 mmol/L (ref 3.5–5.1)
Sodium: 136 mmol/L (ref 135–145)
TOTAL PROTEIN: 7 g/dL (ref 6.5–8.1)
Total Bilirubin: 0.6 mg/dL (ref 0.3–1.2)

## 2018-02-27 LAB — LIPASE, BLOOD: LIPASE: 27 U/L (ref 11–51)

## 2018-02-27 MED ORDER — DIPHENHYDRAMINE HCL 25 MG PO CAPS
25.0000 mg | ORAL_CAPSULE | Freq: Once | ORAL | Status: AC
  Administered 2018-02-27: 25 mg via ORAL

## 2018-02-27 MED ORDER — HYDROMORPHONE HCL 2 MG PO TABS: 1.0000 mg | ORAL_TABLET | Freq: Two times a day (BID) | ORAL | 0 refills | Status: AC | PRN

## 2018-02-27 MED ORDER — ONDANSETRON HCL 4 MG PO TABS: 4.0000 mg | ORAL_TABLET | Freq: Every day | ORAL | 1 refills | Status: AC | PRN

## 2018-02-27 MED ORDER — IOPAMIDOL (ISOVUE-300) INJECTION 61%: 30.0000 mL | Freq: Once | INTRAVENOUS | Status: DC | PRN

## 2018-02-27 MED ORDER — IOPAMIDOL (ISOVUE-300) INJECTION 61%
100.0000 mL | Freq: Once | INTRAVENOUS | Status: AC | PRN
  Administered 2018-02-27: 100 mL via INTRAVENOUS

## 2018-02-27 MED ORDER — DIPHENHYDRAMINE HCL 25 MG PO CAPS
ORAL_CAPSULE | ORAL | Status: AC
  Administered 2018-02-27: 25 mg via ORAL
  Filled 2018-02-27: qty 1

## 2018-02-27 MED ORDER — ONDANSETRON HCL 4 MG/2ML IJ SOLN
4.0000 mg | Freq: Once | INTRAMUSCULAR | Status: AC
  Administered 2018-02-27: 4 mg via INTRAVENOUS
  Filled 2018-02-27: qty 2

## 2018-02-27 MED ORDER — IOHEXOL 300 MG/ML  SOLN
30.0000 mL | Freq: Once | INTRAMUSCULAR | Status: AC | PRN
  Administered 2018-02-27: 30 mL via ORAL

## 2018-02-27 MED ORDER — FENTANYL CITRATE (PF) 100 MCG/2ML IJ SOLN
50.0000 ug | Freq: Once | INTRAMUSCULAR | Status: AC
  Administered 2018-02-27: 50 ug via INTRAVENOUS
  Filled 2018-02-27: qty 2

## 2018-02-27 MED ORDER — FENTANYL CITRATE (PF) 100 MCG/2ML IJ SOLN
25.0000 ug | Freq: Once | INTRAMUSCULAR | Status: AC
  Administered 2018-02-27: 25 ug via INTRAVENOUS
  Filled 2018-02-27: qty 2

## 2018-02-27 MED ORDER — DIPHENHYDRAMINE HCL 50 MG/ML IJ SOLN
25.0000 mg | Freq: Once | INTRAMUSCULAR | Status: AC
  Administered 2018-02-27: 25 mg via INTRAVENOUS
  Filled 2018-02-27: qty 1

## 2018-02-27 NOTE — ED Provider Notes (Addendum)
[facility] Emergency Department Provider Note ____________________________________________   First MD Initiated Contact with Patient 02/27/18 1113     (approximate)  I have reviewed the triage vital signs and the nursing notes.   HISTORY  Chief Complaint Abdominal Pain   HPI Sydney Evans is a 67 y.o. female presents for evaluation of left lower abdominal pain  Patient reports that this morning when she got up she started experiencing a severe pain in her left lower abdomen, had 3 formed bowel movements and is continued to have persistent pain in the left lower abdomen and left flank.  She reports there is a little nausea but no vomiting  No fevers or chills.  Ports she is never had pain like this before.  Denies history of kidney stones or diverticulitis.  Has not seen any black or bloody stool.  Has otherwise been in her normal health except she has had some headaches and was seen for that about a week ago which are better now.  She does report she has a lot of trouble with pain medicines causing hives and itching, she has to be treated with Benadryl prior to receiving them in order not to have a reaction   Past Medical History:  Diagnosis Date  . Anxiety   . Arthritis   . Barrett's esophagus   . Bipolar disorder ([place])   . 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 Blood clot in vein   . Cancer ([place])    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  . Carotid arterial disease ([employer])   . Contracture of joint of upper arm   . Depression   . Elevated lipids   . GERD (gastroesophageal reflux disease)   . Hypertension   . Migraine   . Osteoporosis   . Poor balance   . Sinus congestion   . 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 TIA (transient ischemic attack)   . Vertigo     Patient Active Problem List   Diagnosis Date Noted  . Post-concussion headache 01/20/2018  . Iron deficiency anemia 10/05/2017  . Carotid stenosis 11/17/2016  . GERD (gastroesophageal reflux disease) 11/17/2016  .  Essential hypertension 11/17/2016  . Hyperlipidemia 11/17/2016  . 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 Rotator cuff tendinitis, left 05/20/2016  . History of TIAs 07/17/2015  . 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 TIA (transient ischemic attack) 12/23/2014  . 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 Disorder of bone and articular cartilage 09/29/2014    Past Surgical History:  Procedure Laterality Date  . ABDOMINAL HYSTERECTOMY    . BACK SURGERY    . BREAST BIOPSY Left   . CARPAL TUNNEL RELEASE Bilateral   . CATARACT EXTRACTION W/ INTRAOCULAR LENS  IMPLANT, BILATERAL Bilateral   . CHOLECYSTECTOMY    . crystal cyst removed on left foot    . femoral fx    . FOOT SURGERY    . KNEE SURGERY    . TONSILLECTOMY    . TOTAL KNEE ARTHROPLASTY Bilateral   . TOTAL SHOULDER ARTHROPLASTY Left 08/01/2016   Procedure: TOTAL SHOULDER ARTHROPLASTY;  Surgeon: Corky Mull, MD;  Location: [facility] ORS;  Service: Orthopedics;  Laterality: Left;    Prior to Admission medications   Medication Sig Start Date End Date Taking? Authorizing Provider  aspirin EC 325 MG tablet Take 1 tablet (325 mg total) by mouth daily. 12/23/14   Gladstone Lighter, MD  atorvastatin (LIPITOR) 40  MG tablet Take 1 tablet (40 mg total) by mouth daily at 6 PM. 12/23/14   Gladstone Lighter, MD  Carboxymethylcellulose Sodium (LUBRICANT EYE DROPS OP) Place 1 drop into both eyes daily.    [provider]  citalopram (CELEXA) 40 MG tablet Take 40 mg by mouth daily.    [provider]  diphenhydrAMINE (BENADRYL) 25 MG tablet Take 25 mg by mouth every 6 (six) hours as needed for itching.    [provider]  fluticasone (FLONASE) 50 MCG/ACT nasal spray Place 2 sprays into both nostrils daily as needed for allergies or rhinitis.    [provider]  hydrochlorothiazide (HYDRODIURIL) 25 MG tablet Take 25 mg by mouth daily. 01/16/18   [provider]  HYDROmorphone (DILAUDID) 2 MG tablet Take 0.5 tablets (1 mg total) by mouth every 12 (twelve) hours as needed for severe pain. 02/27/18 02/27/19  Delman Kitten, MD  IBU 800 MG tablet  09/25/17   [provider]  lamoTRIgine (LAMICTAL) 200 MG tablet Take 200 mg by mouth 2 (two) times daily.    [provider]  latanoprost (XALATAN) 0.005 % ophthalmic solution Place 2 drops into both eyes at bedtime.     [provider]  lisinopril (PRINIVIL,ZESTRIL) 40 MG tablet Take 40 mg by mouth daily.    [provider]  metoCLOPramide (REGLAN) 5 MG tablet Take 1 tablet (5 mg total) by mouth 4 (four) times daily as needed for nausea. May also take for headache. 01/20/18   Melvenia Beam, MD  Multiple Vitamin (MULTIVITAMIN WITH MINERALS) TABS tablet Take 1 tablet by mouth daily. One-A-Day Multivitamin for Women 50+    [provider]  omeprazole (PRILOSEC) 40 MG capsule Take 40 mg by mouth daily.    [provider]  ondansetron (ZOFRAN) 4 MG tablet Take 1 tablet (4 mg total) by mouth daily as needed for nausea or vomiting. 02/27/18 02/27/19  Delman Kitten, MD    Allergies Hydroxyzine; Gabapentin; Ketorolac; Hydrocodone; Percocet [oxycodone-acetaminophen]; and Toradol [ketorolac tromethamine]  Family History  Problem Relation Age of Onset  . Other Mother        ?lupus   . Cancer Mother   . High Cholesterol Mother   . CAD Father        CABG  . High Cholesterol Father   . Arthritis Father   . Cancer Sister   . Heart murmur Sister   . Heart murmur Brother   . High blood pressure Other        "for everybody"  . High Cholesterol Other        "for everybody"    Social History Social History   Tobacco Use  . Smoking status: Former Smoker    Packs/day: 0.25    Types: Cigarettes    Last attempt to quit: 08/04/2016    Years since quitting: 1.5  . Smokeless tobacco: Never Used  Substance Use Topics  . Alcohol use: No  . Drug use: No      Review of Systems Constitutional: No fever/chills Eyes: No visual changes. ENT: No sore throat. Cardiovascular: Denies chest pain. Respiratory: Denies shortness of breath. Gastrointestinal: See HPI. Genitourinary: Negative for dysuria.  Does report it feels like she has to empty her bladder frequently since the pain started. Musculoskeletal: Negative for back pain. Skin: Negative for rash. Neurological: Negative for headaches, areas of focal weakness or numbness.    ____________________________________________   PHYSICAL EXAM:  VITAL SIGNS: ED Triage Vitals  Enc Vitals  Group     BP 02/27/18 1030 (!) 166/78     Pulse Rate 02/27/18 1030 73     Resp 02/27/18 1030 16     Temp 02/27/18 1030 98.3 F (36.8 C)     Temp Source 02/27/18 1030 Oral     SpO2 02/27/18 1030 100 %     Weight 02/27/18 1032 160 lb (72.6 kg)     Height 02/27/18 1032 5\' 2"  (1.575 m)     Head Circumference --      Peak Flow --      Pain Score 02/27/18 1032 10     Pain Loc --      Pain Edu? --      Excl. in [place]? --     Constitutional: Alert and oriented. Well appearing overall but sitting upright, wincing forward complaining of severe pain in her left lower abdomen.  She appears quite uncomfortable. Eyes: Conjunctivae are normal. Head: Atraumatic. Nose: No congestion/rhinnorhea. Mouth/Throat: Mucous membranes are moist. Neck: No stridor.  Cardiovascular: Normal rate, regular rhythm. Grossly normal heart sounds.  Good peripheral circulation. Respiratory: Normal respiratory effort.  No retractions. Lungs CTAB. Gastrointestinal: Soft and nontender over the right side of the abdomen right upper quadrant, left upper quadrant no tenderness, the left lower quadrant she reports discomfort and pain particularly around the left flank.  There is no rebound guarding or peritonitis. No distention. Musculoskeletal: No lower extremity tenderness nor edema. Neurologic:  Normal speech and language. No gross focal  neurologic deficits are appreciated.  Skin:  Skin is warm, dry and intact. No rash noted. Psychiatric: Mood and affect are normal. Speech and behavior are normal.  ____________________________________________   LABS (all labs ordered are listed, but only abnormal results are displayed)  Labs Reviewed  COMPREHENSIVE METABOLIC PANEL - Abnormal; Notable for the following components:      Result Value   Glucose, Bld 106 (*)    Calcium 8.7 (*)    All other components within normal limits  CBC WITH DIFFERENTIAL/PLATELET - Abnormal; Notable for the following components:   Hemoglobin 11.5 (*)    HCT 35.9 (*)    Platelets 412 (*)    All other components within normal limits  URINALYSIS, COMPLETE (UACMP) WITH MICROSCOPIC - Abnormal; Notable for the following components:   Color, Urine COLORLESS (*)    APPearance CLEAR (*)    Specific Gravity, Urine 1.003 (*)    All other components within normal limits  LIPASE, BLOOD   ____________________________________________  EKG  ED ECG REPORT I, Delman Kitten, the attending physician, personally viewed and interpreted this ECG.  Date: 02/27/2018 EKG Time: 1046 Rate: 60 Rhythm: normal sinus rhythm QRS Axis: normal Intervals: normal ST/T Wave abnormalities: normal Narrative Interpretation: no evidence of acute ischemia  ____________________________________________  RADIOLOGY  Ct Abdomen Pelvis W Contrast  Result Date: 02/27/2018 CLINICAL DATA:  Flank pain. Evaluate for kidney stones. History of left breast cancer and uterine cancer. Status post hysterectomy and cholecystectomy. EXAM: CT ABDOMEN AND PELVIS WITH CONTRAST TECHNIQUE: Multidetector CT imaging of the abdomen and pelvis was performed using the standard protocol following bolus administration of intravenous contrast. CONTRAST:  136mL ISOVUE-300 IOPAMIDOL (ISOVUE-300) INJECTION 61% COMPARISON:  None. FINDINGS: Lower chest: No acute abnormality. Hepatobiliary: Previous  cholecystectomy. Mild intrahepatic biliary dilatation. Common bile duct measures up to 7 mm. Pancreas: Unremarkable. No pancreatic ductal dilatation or surrounding inflammatory changes. Spleen: Normal in size without focal abnormality. Adrenals/Urinary Tract: Normal appearance of the adrenal glands. No kidney stones are identified  bilaterally. No ureteral calculi or bladder calculi noted. No kidney mass or hydronephrosis. The urinary bladder appears normal. Stomach/Bowel: Stomach is within normal limits. Within the left lower quadrant of the abdomen there is a loop of small bowel which exhibits abnormal wall thickening measuring up to 9 mm in thickness. No surrounding inflammation or free fluid. Appendix appears normal. No evidence of bowel wall thickening, distention, or inflammatory changes. Scattered colonic diverticula. No acute inflammation. Vascular/Lymphatic: Aortic atherosclerosis. No aneurysm. No abdominopelvic adenopathy identified. Reproductive: Status post hysterectomy. No adnexal mass. Within the posterior pelvis there is a small well-circumscribed fluid density structure measuring 1.9 cm, image 68/2. Nonspecific. Other: No free fluid or fluid collections. 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 Status post vertebroplasty at T8, T9 and T10. IMPRESSION: 1. Left lower quadrant small bowel loops has abnormal wall thickening without surrounding inflammation. Favor nonspecific enteritis. If symptoms worsen or persist consider further evaluation with CT or MR enterography. 2. No kidney stones identified. 3. Colonic diverticula noted without acute inflammation. Electronically Signed   By: Kerby Moors M.D.   On: 02/27/2018 13:37    CT scan reviewed by me.  No acute obstructive changes are noted.  Discussed with gastroenterology, advises conservative management and against use of antibiotic at this  time. ____________________________________________   PROCEDURES  Procedure(s) performed: None  Procedures  Critical Care performed: No  ____________________________________________   INITIAL IMPRESSION / ASSESSMENT AND PLAN / ED COURSE  Pertinent labs & imaging results that were available during my care of the patient were reviewed by me and considered in my medical decision making (see chart for details).   Differential diagnosis includes but is not limited to, abdominal perforation, aortic dissection, cholecystitis, appendicitis, diverticulitis, colitis, esophagitis/gastritis, kidney stone, pyelonephritis, urinary tract infection, aortic aneurysm. All are considered in decision and treatment plan. Based upon the patient's presentation and risk factors, I would be most concerned about the possibility of diverticulitis, kidney stone, renal pathology.  No evidence of acute vascular pathology.  We will proceed with CT scan  No associated cardiac or neurologic symptoms. Clinical Course as of Feb 27 1617  Fri Feb 27, 2018  1233 Patient reports pain is improving, but still ongoing mostly in the left lower abdomen.  No adverse reaction of fentanyl, will give additional dose at this time.  Patient fully awake and alert.   [MQ]  1505 Clinical history and presentation discussed with Dr. Francis Gaines no antibiotics and conservative outpatient threatment if able to take by mouth well (which she can.)  Follow-up with GI and PCP advises by Dr. Vicente Males.    [MQ]    Clinical Course User Index [MQ] Delman Kitten, MD   ----------------------------------------- 4:17 PM on 02/27/2018 -----------------------------------------  Patient's pain is improved, she is ambulatory and feeling improved now.  Discussed with the patient will prescribe short course of hydromorphone and discussed precautions around its use.  She reports she has not needed diazepam, and has not picked up a recent prescription at the  pharmacy.  PMP aware reviewed, no other narcotics to noted.  She is in agreement with careful use and not taking within 12 hours of her diazepam. I will prescribe the patient a narcotic pain medicine due to their condition which I anticipate will cause at least moderate pain short term. I discussed with the patient safe use of narcotic pain medicines, and that they are not to drive, work in dangerous areas, or ever take more than prescribed (no more than 1 pill  every 6 hours). We discussed that this is the type of medication that can be  overdosed on and the risks of this type of medicine. Patient is very agreeable to only use as prescribed and to never use more than prescribed.  Patient does report that hydromorphone 1 of the few pain medication she can tolerate without side effect.  Return precautions and treatment recommendations and follow-up discussed with the patient who is agreeable with the plan.  Patient arranging for someone to pick her up and drive her home.  She will follow-up with GI and her primary doctor.  ____________________________________________   FINAL CLINICAL IMPRESSION(S) / ED DIAGNOSES  Final diagnoses:  Enteritis        Note:  This document was prepared using Dragon voice recognition software and may include unintentional dictation errors       Delman Kitten, MD 02/27/18 8502    Delman Kitten, MD 03/22/18 1523

## 2018-02-27 NOTE — ED Notes (Signed)
[employer], pt's friend, will come transport pt home.

## 2018-02-27 NOTE — ED Triage Notes (Signed)
Pt brought in by [facility] due to LLQ abdominal pain since this morning. States pain started suddenly. States she has 3 normal bowel movements this morning. Denies diarrhea. Denies nausea or vomiting. Has had urinary frequency but denies pain upon urination.

## 2018-02-27 NOTE — Discharge Instructions (Signed)
You were seen in the emergency room for abdominal pain. It is important that you follow up closely with your primary care doctor in the next couple of days as well as with gastroenterology (Dr. Vicente Males).  Do not use diazepam (Valium) within 12 hours of using hydromorphone for pain. Use hydromorphone only as prescribed.  Please return to the emergency room right away if you are to develop a fever, severe nausea, your pain becomes severe or worsens, you are unable to keep food down, begin vomiting any dark or bloody fluid, you develop any dark or bloody stools, feel dehydrated, or other new concerns or symptoms arise.

## 2018-03-12 ENCOUNTER — Other Ambulatory Visit: Payer: Self-pay

## 2018-03-12 ENCOUNTER — Other Ambulatory Visit
Admission: RE | Admit: 2018-03-12 | Discharge: 2018-03-12 | Disposition: A | Discharge status: Home / Self Care | Payer: Medicare Other | Source: Ambulatory Visit | Attending: Gastroenterology | Admitting: Gastroenterology

## 2018-03-12 ENCOUNTER — Ambulatory Visit (INDEPENDENT_AMBULATORY_CARE_PROVIDER_SITE_OTHER): Payer: Medicare Other | Admitting: Gastroenterology

## 2018-03-12 ENCOUNTER — Encounter: Payer: Self-pay | Admitting: Gastroenterology

## 2018-03-12 VITALS — BP 117/79 | HR 59 | Ht 62.0 in | Wt 166.2 lb

## 2018-03-12 DIAGNOSIS — R197 Diarrhea, unspecified: Secondary | ICD-10-CM

## 2018-03-12 DIAGNOSIS — D509 Iron deficiency anemia, unspecified: Secondary | ICD-10-CM | POA: Diagnosis not present

## 2018-03-12 DIAGNOSIS — R933 Abnormal findings on diagnostic imaging of other parts of digestive tract: Secondary | ICD-10-CM

## 2018-03-12 DIAGNOSIS — E538 Deficiency of other specified B group vitamins: Secondary | ICD-10-CM

## 2018-03-12 MED ORDER — VITAMIN B-12 1000 MCG PO TABS: 1000.0000 ug | ORAL_TABLET | Freq: Every day | ORAL | 1 refills | Status: DC

## 2018-03-12 NOTE — Progress Notes (Signed)
Jonathon Bellows MD, MRCP(U.K) [address]  [place]  [place], [place] [address]  Main: [phone]  Fax: [phone]   Gastroenterology Consultation  Referring Provider:     Danelle Berry, NP Primary Care Physician:  Danelle Berry, NP Primary Gastroenterologist:  Dr. Jonathon Bellows  Reason for Consultation:     Enteritis         HPI:   Sydney Evans is a 67 y.o. y/o female referred for consultation & management  by Dr. Danelle Berry, NP.    She has a history of iron deficiency anemia and sees Dr Tasia Catchings in Hematology.   She was seen at the ER on 02/27/18 for abdominal pain.  A Ct scan of the abdomen was performed and it showed left lower quadrant small bowel loops with abnormal thickening non specific enteritis.   I was discussed with over the phone and suggested conservative management and outpatient follow up.    She has had a microcytic anemia which has gradually improved. Unclear etiology so far for the anemia. I do not see any GI work up . No blood in the urine. B12 206 -low normal.   Family history of thyroid issues.   She says that she has had the pain for about a day prior to her ER visit. Since then it has resolved. She has had diarrhea for a few months. 3-4 times a day , with mucus, no blood.   She says she takes ibuprofen as needed - not taken any for 3 weeks, before that she used to take 2-3 times a day , for many years.   She has had an EGD x 2 when she was 20 and 4-5 years back- recalls she had barretts esophagus. She suffers from reflux.   Last colonoscopy was 4 years and was normal - says may be had 1 polyp. Her dad had and uncles had colon cancer.   Denies any blood in urine, no blood in her stool, no hematemesis or melena,presently has stool is yellow in color.    Past Medical History:  Diagnosis Date  . Anxiety   . Arthritis   . Barrett's esophagus   . Bipolar disorder ([place])   . 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 Blood clot in vein   .  Cancer ([place])    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  . Carotid arterial disease ([place])   . Contracture of joint of upper arm   . Depression   . Elevated lipids   . GERD (gastroesophageal reflux disease)   . Hypertension   . Migraine   . Osteoporosis   . Poor balance   . Sinus congestion   . 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 TIA (transient ischemic attack)   . Vertigo     Past Surgical History:  Procedure Laterality Date  . ABDOMINAL HYSTERECTOMY    . BACK SURGERY    . BREAST BIOPSY Left   . CARPAL TUNNEL RELEASE Bilateral   . CATARACT EXTRACTION W/ INTRAOCULAR LENS  IMPLANT, BILATERAL Bilateral   . CHOLECYSTECTOMY    . crystal cyst removed on left foot    . femoral fx    . FOOT SURGERY    . KNEE SURGERY    . TONSILLECTOMY    . TOTAL KNEE ARTHROPLASTY Bilateral   . TOTAL SHOULDER ARTHROPLASTY Left 08/01/2016   Procedure: TOTAL SHOULDER ARTHROPLASTY;  Surgeon: Corky Mull, MD;  Location: [facility] ORS;  Service: Orthopedics;  Laterality:  Left;    Prior to Admission medications   Medication Sig Start Date End Date Taking? Authorizing Provider  aspirin EC 325 MG tablet Take 1 tablet (325 mg total) by mouth daily. 12/23/14   Gladstone Lighter, MD  atorvastatin (LIPITOR) 40 MG tablet Take 1 tablet (40 mg total) by mouth daily at 6 PM. 12/23/14   Gladstone Lighter, MD  Carboxymethylcellulose Sodium (LUBRICANT EYE DROPS OP) Place 1 drop into both eyes daily.    [provider]  citalopram (CELEXA) 40 MG tablet Take 40 mg by mouth daily.    [provider]  diphenhydrAMINE (BENADRYL) 25 MG tablet Take 25 mg by mouth every 6 (six) hours as needed for itching.    [provider]  fluticasone (FLONASE) 50 MCG/ACT nasal spray Place 2 sprays into both nostrils daily as needed for allergies or rhinitis.    [provider]  hydrochlorothiazide (HYDRODIURIL) 25 MG tablet Take 25 mg by mouth daily. 01/16/18   [provider]  HYDROmorphone (DILAUDID) 2 MG tablet Take 0.5 tablets (1 mg  total) by mouth every 12 (twelve) hours as needed for severe pain. 02/27/18 02/27/19  Delman Kitten, MD  IBU 800 MG tablet  09/25/17   [provider]  lamoTRIgine (LAMICTAL) 200 MG tablet Take 200 mg by mouth 2 (two) times daily.    [provider]  latanoprost (XALATAN) 0.005 % ophthalmic solution Place 2 drops into both eyes at bedtime.     [provider]  lisinopril (PRINIVIL,ZESTRIL) 40 MG tablet Take 40 mg by mouth daily.    [provider]  metoCLOPramide (REGLAN) 5 MG tablet Take 1 tablet (5 mg total) by mouth 4 (four) times daily as needed for nausea. May also take for headache. 01/20/18   Melvenia Beam, MD  Multiple Vitamin (MULTIVITAMIN WITH MINERALS) TABS tablet Take 1 tablet by mouth daily. One-A-Day Multivitamin for Women 50+    [provider]  omeprazole (PRILOSEC) 40 MG capsule Take 40 mg by mouth daily.    [provider]  ondansetron (ZOFRAN) 4 MG tablet Take 1 tablet (4 mg total) by mouth daily as needed for nausea or vomiting. 02/27/18 02/27/19  Delman Kitten, MD  vitamin B-12 (CYANOCOBALAMIN) 1000 MCG tablet Take 1 tablet (1,000 mcg total) by mouth daily. 03/12/18 07/11/18  Jonathon Bellows, MD    Family History  Problem Relation Age of Onset  . Other Mother        ?lupus   . Cancer Mother   . High Cholesterol Mother   . CAD Father        CABG  . High Cholesterol Father   . Arthritis Father   . Cancer Sister   . Heart murmur Sister   . Heart murmur Brother   . High blood pressure Other        "for everybody"  . High Cholesterol Other        "for everybody"     Social History   Tobacco Use  . Smoking status: Former Smoker    Packs/day: 0.25    Types: Cigarettes    Last attempt to quit: 08/04/2016    Years since quitting: 1.6  . Smokeless tobacco: Never Used  Substance Use Topics  . Alcohol use: No  . Drug use: No    Allergies as of 03/12/2018 - Review Complete 02/27/2018  Allergen Reaction Noted  .  Hydroxyzine Hives and Rash 01/20/2018  . Gabapentin  08/31/2015  . Ketorolac Other (See Comments)   .  Hydrocodone Hives and Rash 07/22/2016  . Percocet [oxycodone-acetaminophen] Itching and Rash 12/22/2014  . Toradol [ketorolac tromethamine] Rash 08/01/2016    Review of Systems:    All systems reviewed and negative except where noted in HPI.   Physical Exam:  There were no vitals taken for this visit. No LMP recorded. Patient has had a hysterectomy. Psych:  Alert and cooperative. Normal mood and affect. General:   Alert,  Well-developed, well-nourished, pleasant and cooperative in NAD Head:  Normocephalic and atraumatic. Eyes:  Sclera clear, no icterus.   Conjunctiva pink. Ears:  Normal auditory acuity. Nose:  No deformity, discharge, or lesions. Mouth:  No deformity or lesions,oropharynx pink & moist. Neck:  Supple; no masses or thyromegaly. Lungs:  Respirations even and unlabored.  Clear throughout to auscultation.   No wheezes, crackles, or rhonchi. No acute distress. Heart:  Regular rate and rhythm; no murmurs, clicks, rubs, or gallops. Abdomen:  Normal bowel sounds.  No bruits.  Soft, non-tender and non-distended without masses, hepatosplenomegaly or hernias noted.  No guarding or rebound tenderness.    Msk:  Symmetrical without gross deformities. Good, equal movement & strength bilaterally. Pulses:  Normal pulses noted. Extremities:  No clubbing or edema.  No cyanosis. Neurologic:  Alert and oriented x3;  grossly normal neurologically. Skin:  Intact without significant lesions or rashes. No jaundice. Lymph Nodes:  No significant cervical adenopathy. Psych:  Alert and cooperative. Normal mood and affect.  Imaging Studies: Ct Abdomen Pelvis W Contrast  Result Date: 02/27/2018 CLINICAL DATA:  Flank pain. Evaluate for kidney stones. History of left breast cancer and uterine cancer. Status post hysterectomy and cholecystectomy. EXAM: CT ABDOMEN AND PELVIS WITH CONTRAST  TECHNIQUE: Multidetector CT imaging of the abdomen and pelvis was performed using the standard protocol following bolus administration of intravenous contrast. CONTRAST:  174mL ISOVUE-300 IOPAMIDOL (ISOVUE-300) INJECTION 61% COMPARISON:  None. FINDINGS: Lower chest: No acute abnormality. Hepatobiliary: Previous cholecystectomy. Mild intrahepatic biliary dilatation. Common bile duct measures up to 7 mm. Pancreas: Unremarkable. No pancreatic ductal dilatation or surrounding inflammatory changes. Spleen: Normal in size without focal abnormality. Adrenals/Urinary Tract: Normal appearance of the adrenal glands. No kidney stones are identified bilaterally. No ureteral calculi or bladder calculi noted. No kidney mass or hydronephrosis. The urinary bladder appears normal. Stomach/Bowel: Stomach is within normal limits. Within the left lower quadrant of the abdomen there is a loop of small bowel which exhibits abnormal wall thickening measuring up to 9 mm in thickness. No surrounding inflammation or free fluid. Appendix appears normal. No evidence of bowel wall thickening, distention, or inflammatory changes. Scattered colonic diverticula. No acute inflammation. Vascular/Lymphatic: Aortic atherosclerosis. No aneurysm. No abdominopelvic adenopathy identified. Reproductive: Status post hysterectomy. No adnexal mass. Within the posterior pelvis there is a small well-circumscribed fluid density structure measuring 1.9 cm, image 68/2. Nonspecific. Other: No free fluid or fluid collections. 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 Status post vertebroplasty at T8, T9 and T10. IMPRESSION: 1. Left lower quadrant small bowel loops has abnormal wall thickening without surrounding inflammation. Favor nonspecific enteritis. If symptoms worsen or persist consider further evaluation with CT or MR enterography. 2. No kidney stones identified. 3. Colonic diverticula noted without acute inflammation.  Electronically Signed   By: Kerby Moors M.D.   On: 02/27/2018 13:37    Assessment and Plan:   Lucely Leard is a 67 y.o. y/o female has been referred for abdominal pain after an ER visit. CT scan showed non specific small bowel enteritis.Differentials include infectious  which are usually self limiting but in setting of anemia and iron deficiency can also be seen in Crohns disease, can also be secondary to chronic NSAID use which she just stopped.  On IV iron for iron deficiency anemia- no GI work up has been initiated so far . She has a history of barrettes esophagus, last EGD 4 years back , family history of colon cancer and last colonoscopy was atleast 4 years back. She is a smoker .    Plan  1. Low b12 at 206- continue replacement  2. No NSAID's 3. EGD+colonoscopy with intubation of the TI and biopsies to rule out ileitis. If negative will need capsule study of  The small bowel  4. MR enterogram to determine if small bowel inflammation has resolved.,  5. IV iron with Dr Tasia Catchings 6. No NSAID's 7. Continue PPI benefits exceed risks with history of Barrettes esophagus.  8. Diarrhea - r/o infection and if negative trial of Questran as she has no gall bladder  I have discussed alternative options, risks & benefits,  which include, but are not limited to, bleeding, infection, perforation,respiratory complication & drug reaction.  The patient agrees with this plan & written consent will be obtained.     Follow up in 6 weeks   Dr Jonathon Bellows MD,MRCP(U.K)

## 2018-03-13 LAB — ANTI-PARIETAL ANTIBODY: Parietal Cell Antibody-IgG: 2.2 Units (ref 0.0–20.0)

## 2018-03-13 LAB — H. PYLORI BREATH TEST: H. pylori UBiT: NEGATIVE

## 2018-03-13 LAB — INTRINSIC FACTOR ANTIBODIES: INTRINSIC FACTOR: 1 [AU]/ml (ref 0.0–1.1)

## 2018-03-16 ENCOUNTER — Other Ambulatory Visit
Admission: RE | Admit: 2018-03-16 | Discharge: 2018-03-16 | Disposition: A | Discharge status: Home / Self Care | Payer: Medicare Other | Source: Ambulatory Visit | Attending: Gastroenterology | Admitting: Gastroenterology

## 2018-03-16 DIAGNOSIS — R197 Diarrhea, unspecified: Secondary | ICD-10-CM | POA: Insufficient documentation

## 2018-03-16 LAB — C DIFFICILE QUICK SCREEN W PCR REFLEX
C Diff antigen: NEGATIVE
C Diff interpretation: NOT DETECTED
C Diff toxin: NEGATIVE

## 2018-03-17 LAB — CALPROTECTIN, FECAL: CALPROTECTIN, FECAL: 39 ug/g (ref 0–120)

## 2018-03-18 ENCOUNTER — Ambulatory Visit: Payer: Self-pay | Admitting: Neurology

## 2018-03-18 ENCOUNTER — Telehealth: Payer: Self-pay

## 2018-03-18 NOTE — Telephone Encounter (Signed)
Spoke with pt regarding her MR enterogram appointment. I have informed of her appointment information and prior prep instructions.

## 2018-03-20 ENCOUNTER — Telehealth: Payer: Self-pay

## 2018-03-20 NOTE — Telephone Encounter (Signed)
Spoke with pt and informed her that we have received her blood thinner clearance from her PCP for the Aspirin 325mg . Pt was instructed to stop Aspirin 5 days prior to Endoscopy procedure and resume taking 1 day after procedure.

## 2018-03-22 ENCOUNTER — Encounter: Payer: Self-pay | Admitting: Gastroenterology

## 2018-03-24 DIAGNOSIS — M51369 Other intervertebral disc degeneration, lumbar region without mention of lumbar back pain or lower extremity pain: Secondary | ICD-10-CM | POA: Insufficient documentation

## 2018-03-24 DIAGNOSIS — M5136 Other intervertebral disc degeneration, lumbar region: Secondary | ICD-10-CM | POA: Insufficient documentation

## 2018-03-27 ENCOUNTER — Ambulatory Visit (INDEPENDENT_AMBULATORY_CARE_PROVIDER_SITE_OTHER): Payer: Medicare Other | Admitting: Podiatry

## 2018-03-27 ENCOUNTER — Encounter: Payer: Self-pay | Admitting: Podiatry

## 2018-03-27 DIAGNOSIS — L989 Disorder of the skin and subcutaneous tissue, unspecified: Secondary | ICD-10-CM | POA: Diagnosis not present

## 2018-03-27 DIAGNOSIS — M79676 Pain in unspecified toe(s): Secondary | ICD-10-CM | POA: Diagnosis not present

## 2018-03-27 DIAGNOSIS — B351 Tinea unguium: Secondary | ICD-10-CM

## 2018-03-30 ENCOUNTER — Other Ambulatory Visit: Payer: Medicare Other

## 2018-03-30 NOTE — Progress Notes (Signed)
    Subjective: Patient is a 67 y.o. female presenting to the office today with a chief complaint of painful callus lesions to the bilateral third toes that have been present for the past several years. Walking and bearing weight increases the pain. She has not done anything at home for treatment.   Patient also complains of elongated, thickened nails that cause pain while ambulating in shoes. She is unable to trim her own nails. Patient presents today for further treatment and evaluation.  Past Medical History:  Diagnosis Date  . Anxiety   . Arthritis   . Barrett's esophagus   . Bipolar disorder ([place])   . 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 Blood clot in vein   . Cancer ([place])    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  . Carotid arterial disease ([place])   . Contracture of joint of upper arm   . Depression   . Elevated lipids   . GERD (gastroesophageal reflux disease)   . Hypertension   . Migraine   . Osteoporosis   . Poor balance   . Sinus congestion   . 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 TIA (transient ischemic attack)   . Vertigo     Objective:  Physical Exam General: Alert and oriented x3 in no acute distress  Dermatology: Hyperkeratotic lesion present on the bilateral third toes. Pain on palpation with a central nucleated core noted. Skin is warm, dry and supple bilateral lower extremities. Negative for open lesions or macerations. Nails are tender, long, thickened and dystrophic with subungual debris, consistent with onychomycosis, 1-5 bilateral. No signs of infection noted.  Vascular: Palpable pedal pulses bilaterally. No edema or erythema noted. Capillary refill within normal limits.  Neurological: Epicritic and protective threshold grossly intact bilaterally.   Musculoskeletal Exam: Pain on palpation at the keratotic lesion noted. Range of motion within normal limits bilateral. Muscle strength 5/5 in all groups bilateral.  Assessment: 1. Onychodystrophic nails 1-5 bilateral with hyperkeratosis of nails.   2. Onychomycosis of nail due to dermatophyte bilateral 3. Pre-ulcerative callus lesions noted to the bilateral 3rd toes   Plan of Care:  1. Patient evaluated. 2. Excisional debridement of keratoic lesion using a chisel blade was performed without incident.  3. Dressed with light dressing. 4. Mechanical debridement of nails 1-5 bilaterally performed using a nail nipper. Filed with dremel without incident.  5. Patient is to return to the clinic in 3 months.   Edrick Kins, DPM [facility]  Dr. Edrick Kins, [address]                                        [place], [place] [address]                Office [phone]  Fax [phone]

## 2018-04-01 ENCOUNTER — Ambulatory Visit: Payer: Medicare Other | Admitting: Gastroenterology

## 2018-04-01 ENCOUNTER — Ambulatory Visit: Payer: Medicare Other | Admitting: Orthotics

## 2018-04-01 ENCOUNTER — Encounter

## 2018-04-01 DIAGNOSIS — L989 Disorder of the skin and subcutaneous tissue, unspecified: Secondary | ICD-10-CM

## 2018-04-01 DIAGNOSIS — Z9181 History of falling: Secondary | ICD-10-CM

## 2018-04-01 DIAGNOSIS — M2041 Other hammer toe(s) (acquired), right foot: Secondary | ICD-10-CM

## 2018-04-01 DIAGNOSIS — F40298 Other specified phobia: Secondary | ICD-10-CM

## 2018-04-01 NOTE — Progress Notes (Signed)
Reorder shoes as these are falling apart:   MBS 8 Wide.

## 2018-04-03 ENCOUNTER — Other Ambulatory Visit: Payer: Medicare Other

## 2018-04-09 ENCOUNTER — Encounter
Admission: RE | Disposition: A | Discharge status: Home / Self Care | Payer: Self-pay | Source: Ambulatory Visit | Attending: Gastroenterology

## 2018-04-09 ENCOUNTER — Ambulatory Visit: Payer: Medicare Other | Admitting: Anesthesiology

## 2018-04-09 ENCOUNTER — Ambulatory Visit
Admission: RE | Admit: 2018-04-09 | Discharge: 2018-04-09 | Disposition: A | Discharge status: Home / Self Care | Payer: Medicare Other | Source: Ambulatory Visit | Attending: Gastroenterology | Admitting: Gastroenterology

## 2018-04-09 ENCOUNTER — Encounter: Payer: Self-pay | Admitting: Emergency Medicine

## 2018-04-09 DIAGNOSIS — R933 Abnormal findings on diagnostic imaging of other parts of digestive tract: Secondary | ICD-10-CM

## 2018-04-09 DIAGNOSIS — K31819 Angiodysplasia of stomach and duodenum without bleeding: Secondary | ICD-10-CM | POA: Diagnosis not present

## 2018-04-09 DIAGNOSIS — F319 Bipolar disorder, unspecified: Secondary | ICD-10-CM | POA: Diagnosis not present

## 2018-04-09 DIAGNOSIS — M810 Age-related osteoporosis without current pathological fracture: Secondary | ICD-10-CM | POA: Insufficient documentation

## 2018-04-09 DIAGNOSIS — I10 Essential (primary) hypertension: Secondary | ICD-10-CM | POA: Insufficient documentation

## 2018-04-09 DIAGNOSIS — G43909 Migraine, unspecified, not intractable, without status migrainosus: Secondary | ICD-10-CM | POA: Diagnosis not present

## 2018-04-09 DIAGNOSIS — F419 Anxiety disorder, unspecified: Secondary | ICD-10-CM | POA: Diagnosis not present

## 2018-04-09 DIAGNOSIS — Z7951 Long term (current) use of inhaled steroids: Secondary | ICD-10-CM | POA: Insufficient documentation

## 2018-04-09 DIAGNOSIS — R197 Diarrhea, unspecified: Secondary | ICD-10-CM | POA: Diagnosis not present

## 2018-04-09 DIAGNOSIS — Z87891 Personal history of nicotine dependence: Secondary | ICD-10-CM | POA: Insufficient documentation

## 2018-04-09 DIAGNOSIS — D509 Iron deficiency anemia, unspecified: Secondary | ICD-10-CM | POA: Insufficient documentation

## 2018-04-09 DIAGNOSIS — M1990 Unspecified osteoarthritis, unspecified site: Secondary | ICD-10-CM | POA: Diagnosis not present

## 2018-04-09 DIAGNOSIS — I739 Peripheral vascular disease, unspecified: Secondary | ICD-10-CM | POA: Diagnosis not present

## 2018-04-09 DIAGNOSIS — Z7982 Long term (current) use of aspirin: Secondary | ICD-10-CM | POA: Insufficient documentation

## 2018-04-09 DIAGNOSIS — Z8673 Personal history of transient ischemic attack (TIA), and cerebral infarction without residual deficits: Secondary | ICD-10-CM | POA: Insufficient documentation

## 2018-04-09 DIAGNOSIS — K219 Gastro-esophageal reflux disease without esophagitis: Secondary | ICD-10-CM | POA: Insufficient documentation

## 2018-04-09 HISTORY — PX: ESOPHAGOGASTRODUODENOSCOPY (EGD) WITH PROPOFOL: SHX5813

## 2018-04-09 HISTORY — PX: COLONOSCOPY WITH PROPOFOL: SHX5780

## 2018-04-09 SURGERY — COLONOSCOPY WITH PROPOFOL
Anesthesia: General

## 2018-04-09 MED ORDER — GLYCOPYRROLATE 0.2 MG/ML IJ SOLN
INTRAMUSCULAR | Status: DC | PRN
  Administered 2018-04-09: 0.2 mg via INTRAVENOUS

## 2018-04-09 MED ORDER — PROPOFOL 10 MG/ML IV BOLUS
INTRAVENOUS | Status: DC | PRN
  Administered 2018-04-09: 80 mg via INTRAVENOUS

## 2018-04-09 MED ORDER — SODIUM CHLORIDE 0.9 % IV SOLN
INTRAVENOUS | Status: DC
  Administered 2018-04-09: 1000 mL via INTRAVENOUS

## 2018-04-09 MED ORDER — PROPOFOL 500 MG/50ML IV EMUL
INTRAVENOUS | Status: DC | PRN
  Administered 2018-04-09: 150 ug/kg/min via INTRAVENOUS

## 2018-04-09 MED ORDER — LIDOCAINE HCL (CARDIAC) PF 100 MG/5ML IV SOSY
PREFILLED_SYRINGE | INTRAVENOUS | Status: DC | PRN
  Administered 2018-04-09: 100 mg via INTRAVENOUS

## 2018-04-09 NOTE — Anesthesia Post-op Follow-up Note (Signed)
Anesthesia QCDR form completed.        

## 2018-04-09 NOTE — H&P (Signed)
Jonathon Bellows, MD [address] [place], [place], [place], [address] [address], [place], [place], [place], [address] Phone: [phone]  Fax: [phone]  Primary Care Physician:  [facility]   Pre-Procedure History & Physical: HPI:  Sydney Evans is a 67 y.o. female is here for an endoscopy and colonoscopy    Past Medical History:  Diagnosis Date  . Anxiety   . Arthritis   . Barrett's esophagus   . Bipolar disorder ([place])   . 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 Blood clot in vein   . Cancer ([place])    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  . Carotid arterial disease ([place])   . Contracture of joint of upper arm   . Depression   . Elevated lipids   . GERD (gastroesophageal reflux disease)   . Hypertension   . Migraine   . Osteoporosis   . Poor balance   . Sinus congestion   . 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 TIA (transient ischemic attack)   . Vertigo     Past Surgical History:  Procedure Laterality Date  . ABDOMINAL HYSTERECTOMY    . BACK SURGERY    . BREAST BIOPSY Left   . CARPAL TUNNEL RELEASE Bilateral   . CATARACT EXTRACTION W/ INTRAOCULAR LENS  IMPLANT, BILATERAL Bilateral   . CHOLECYSTECTOMY    . crystal cyst removed on left foot    . femoral fx    . FOOT SURGERY    . KNEE SURGERY    . TONSILLECTOMY    . TOTAL KNEE ARTHROPLASTY Bilateral   . TOTAL SHOULDER ARTHROPLASTY Left 08/01/2016   Procedure: TOTAL SHOULDER ARTHROPLASTY;  Surgeon: Corky Mull, MD;  Location: [facility] ORS;  Service: Orthopedics;  Laterality: Left;    Prior to Admission medications   Medication Sig Start Date End Date Taking? Authorizing Provider  aspirin EC 325 MG tablet Take 1 tablet (325 mg total) by mouth daily. 12/23/14  Yes Gladstone Lighter, MD  Carboxymethylcellulose Sodium (LUBRICANT EYE DROPS OP) Place 1 drop into both eyes daily.   Yes [provider]  citalopram (CELEXA) 40 MG tablet Take 40 mg by mouth daily.   Yes [provider]  diphenhydrAMINE  (BENADRYL) 25 MG tablet Take 25 mg by mouth every 6 (six) hours as needed for itching.   Yes [provider]  fluticasone (FLONASE) 50 MCG/ACT nasal spray Place 2 sprays into both nostrils daily as needed for allergies or rhinitis.   Yes [provider]  hydrochlorothiazide (HYDRODIURIL) 25 MG tablet Take 25 mg by mouth daily. 01/16/18  Yes [provider]  IBU 800 MG tablet  09/25/17  Yes [provider]  lamoTRIgine (LAMICTAL) 200 MG tablet Take 200 mg by mouth 2 (two) times daily.   Yes [provider]  latanoprost (XALATAN) 0.005 % ophthalmic solution Place 2 drops into both eyes at bedtime.    Yes [provider]  lisinopril (PRINIVIL,ZESTRIL) 40 MG tablet Take 40 mg by mouth daily.   Yes [provider]  Multiple Vitamin (MULTIVITAMIN WITH MINERALS) TABS tablet Take 1 tablet by mouth daily. One-A-Day Multivitamin for Women 50+   Yes [provider]  omeprazole (PRILOSEC) 40 MG capsule Take 40 mg by mouth daily.   Yes [provider]  ondansetron (ZOFRAN) 4 MG tablet Take 1 tablet (4 mg total) by mouth daily as needed for nausea or vomiting. 02/27/18 02/27/19 Yes Delman Kitten, MD  atorvastatin (  LIPITOR) 40 MG tablet Take 1 tablet (40 mg total) by mouth daily at 6 PM. Patient not taking: Reported on 03/12/2018 12/23/14   Gladstone Lighter, MD  HYDROmorphone (DILAUDID) 2 MG tablet Take 0.5 tablets (1 mg total) by mouth every 12 (twelve) hours as needed for severe pain. Patient not taking: Reported on 03/12/2018 02/27/18 02/27/19  Delman Kitten, MD  metoCLOPramide (REGLAN) 5 MG tablet Take 1 tablet (5 mg total) by mouth 4 (four) times daily as needed for nausea. May also take for headache. Patient not taking: Reported on 03/12/2018 01/20/18   Melvenia Beam, MD    Allergies as of 03/12/2018 - Review Complete 03/12/2018  Allergen Reaction Noted  . Hydroxyzine Hives and Rash 01/20/2018  . Gabapentin  08/31/2015  .  Ketorolac Other (See Comments)   . Hydrocodone Hives and Rash 07/22/2016  . Percocet [oxycodone-acetaminophen] Itching and Rash 12/22/2014  . Toradol [ketorolac tromethamine] Rash 08/01/2016    Family History  Problem Relation Age of Onset  . Other Mother        ?lupus   . Cancer Mother   . High Cholesterol Mother   . CAD Father        CABG  . High Cholesterol Father   . Arthritis Father   . Cancer Sister   . Heart murmur Sister   . Heart murmur Brother   . High blood pressure Other        "for everybody"  . High Cholesterol Other        "for everybody"    Social History   Socioeconomic History  . Marital status: Single    Spouse name: Not on file  . Number of children: 3  . Years of education: Not on file  . Highest education level: Some college, no degree  Occupational History  . Not on file  Social Needs  . Financial resource strain: Not on file  . Food insecurity:    Worry: Not on file    Inability: Not on file  . Transportation needs:    Medical: Not on file    Non-medical: Not on file  Tobacco Use  . Smoking status: Former Smoker    Packs/day: 0.25    Types: Cigarettes    Last attempt to quit: 08/04/2016    Years since quitting: 1.6  . Smokeless tobacco: Never Used  Substance and Sexual Activity  . Alcohol use: No  . Drug use: No  . Sexual activity: Not on file  Lifestyle  . Physical activity:    Days per week: Not on file    Minutes per session: Not on file  . Stress: Not on file  Relationships  . Social connections:    Talks on phone: Not on file    Gets together: Not on file    Attends religious service: Not on file    Active member of club or organization: Not on file    Attends meetings of clubs or organizations: Not on file    Relationship status: Not on file  . Intimate partner violence:    Fear of current or ex partner: Not on file    Emotionally abused: Not on file    Physically abused: Not on file    Forced sexual activity: Not on  file  Other Topics Concern  . Not on file  Social History Narrative   Lives at home alone in an apt   Right handed   Disabled since 1998   Caffeine: about 30 oz  daily    Review of Systems: See HPI, otherwise negative ROS  Physical Exam: BP (!) 155/68   Pulse (!) 58   Temp (!) 96.9 F (36.1 C) (Tympanic)   Resp 18   Ht 5\' 2"  (1.575 m)   Wt 74.8 kg   SpO2 99%   BMI 30.18 kg/m  General:   Alert,  pleasant and cooperative in NAD Head:  Normocephalic and atraumatic. Neck:  Supple; no masses or thyromegaly. Lungs:  Clear throughout to auscultation, normal respiratory effort.    Heart:  +S1, +S2, Regular rate and rhythm, No edema. Abdomen:  Soft, nontender and nondistended. Normal bowel sounds, without guarding, and without rebound.   Neurologic:  Alert and  oriented x4;  grossly normal neurologically.  Impression/Plan: Sydney Evans is here for an endoscopy and colonoscopy  to be performed for  evaluation of iron deficiency anemia    Risks, benefits, limitations, and alternatives regarding endoscopy have been reviewed with the patient.  Questions have been answered.  All parties agreeable.   Jonathon Bellows, MD  04/09/2018, 8:14 AM

## 2018-04-09 NOTE — Op Note (Signed)
[facility] Gastroenterology Patient Name: Sydney Evans Procedure Date: 04/09/2018 8:24 AM MRN: [id number] Account #: [id number] Date of Birth: Nov 15, 1950 Admit Type: Outpatient Age: 67 Room: [facility] ENDO ROOM 4 Gender: Female Note Status: Finalized Procedure:            Upper GI endoscopy Indications:          Iron deficiency anemia Providers:            Jonathon Bellows MD, MD Referring MD:         [facility]Barton Dubois (Referring MD) Medicines:            Monitored Anesthesia Care Complications:        No immediate complications. Procedure:            Pre-Anesthesia Assessment:                       - Prior to the procedure, a History and Physical was                        performed, and patient medications, allergies and                        sensitivities were reviewed. The patient's tolerance of                        previous anesthesia was reviewed.                       - The risks and benefits of the procedure and the                        sedation options and risks were discussed with the                        patient. All questions were answered and informed                        consent was obtained.                       - ASA Grade Assessment: III - A patient with severe                        systemic disease.                       After obtaining informed consent, the endoscope was                        passed under direct vision. Throughout the procedure,                        the patient's blood pressure, pulse, and oxygen                        saturations were monitored continuously. The Endoscope                        was introduced through the mouth, and advanced to the  third part of duodenum. The upper GI endoscopy was                        accomplished with ease. The patient tolerated the                        procedure well. Findings:      The esophagus was normal.      The stomach was normal.  The cardia and gastric fundus were normal on retroflexion.      Three small angioectasias without bleeding were found in the duodenal       bulb and in the third portion of the duodenum. Coagulation for bleeding       prevention using argon plasma at 0.5 liters/minute and 20 watts was       successful. Impression:           - Normal esophagus.                       - Normal stomach.                       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                        Treated with argon plasma coagulation (APC).                       - No specimens collected. Recommendation:       - Perform a colonoscopy today. 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 All rights reserved. The codes documented in this report are preliminary and upon coder review may  be revised to meet current compliance requirements. Jonathon Bellows, MD Jonathon Bellows MD, MD 04/09/2018 8:40:37 AM This report has been signed electronically. Number of Addenda: 0 Note Initiated On: 04/09/2018 8:24 AM      [facility]

## 2018-04-09 NOTE — Op Note (Signed)
[facility] Gastroenterology Patient Name: Sydney Evans Procedure Date: 04/09/2018 8:22 AM MRN: [id number] Account #: [id number] Date of Birth: 11-12-1950 Admit Type: Outpatient Age: 67 Room: [facility] ENDO ROOM 4 Gender: Female Note Status: Finalized Procedure:            Colonoscopy Indications:          Iron deficiency anemia Providers:            Jonathon Bellows MD, MD Referring MD:         [facility] ***Barton Dubois (Referring MD) Medicines:            Monitored Anesthesia Care Complications:        No immediate complications. Procedure:            Pre-Anesthesia Assessment:                       - Prior to the procedure, a History and Physical was                        performed, and patient medications, allergies and                        sensitivities were reviewed. The patient's tolerance of                        previous anesthesia was reviewed.                       - The risks and benefits of the procedure and the                        sedation options and risks were discussed with the                        patient. All questions were answered and informed                        consent was obtained.                       - ASA Grade Assessment: III - A patient with severe                        systemic disease.                       After obtaining informed consent, the colonoscope was                        passed under direct vision. Throughout the procedure,                        the patient's blood pressure, pulse, and oxygen                        saturations were monitored continuously. The                        Colonoscope was introduced through the anus and  advanced to the the cecum, identified by the                        appendiceal orifice, IC valve and transillumination.                        The colonoscopy was performed with ease. The patient                        tolerated the procedure well. The quality  of the bowel                        preparation was good. Findings:      The perianal and digital rectal examinations were normal.      The exam was otherwise without abnormality on direct and retroflexion       views.      Multiple small-mouthed diverticula were found in the entire colon. Impression:           - The examination was otherwise normal on direct and                        retroflexion views.                       - No specimens collected. Recommendation:       - Discharge patient to home (with escort).                       - Resume previous diet.                       - Continue present medications.                       - Repeat colonoscopy in 5 years for surveillance. Procedure Code(s):    --- Professional ---                       [phone], Colonoscopy, flexible; diagnostic, including                        collection of specimen(s) by brushing or washing, when                        performed (separate procedure) Diagnosis Code(s):    --- Professional ---                       D50.9, Iron deficiency anemia, unspecified CPT copyright 2018 American Medical Association. All rights reserved. The codes documented in this report are preliminary and upon coder review may  be revised to meet current compliance requirements. Jonathon Bellows, MD Jonathon Bellows MD, MD 04/09/2018 9:01:36 AM This report has been signed electronically. Number of Addenda: 0 Note Initiated On: 04/09/2018 8:22 AM Scope Withdrawal Time: 0 hours 13 minutes 7 seconds  Total Procedure Duration: 0 hours 14 minutes 52 seconds       [facility]

## 2018-04-09 NOTE — Anesthesia Postprocedure Evaluation (Signed)
Anesthesia Post Note  Patient: Sydney Evans  Procedure(s) Performed: COLONOSCOPY WITH PROPOFOL (N/A ) ESOPHAGOGASTRODUODENOSCOPY (EGD) WITH PROPOFOL (N/A )  Patient location during evaluation: Endoscopy Anesthesia Type: General Level of consciousness: awake and alert Pain management: pain level controlled Vital Signs Assessment: post-procedure vital signs reviewed and stable Respiratory status: spontaneous breathing, nonlabored ventilation, respiratory function stable and patient connected to nasal cannula oxygen Cardiovascular status: blood pressure returned to baseline and stable Postop Assessment: no apparent nausea or vomiting Anesthetic complications: no     Last Vitals:  Vitals:   04/09/18 0920 04/09/18 0930  BP: (!) 113/59 127/89  Pulse: 62 62  Resp: 15 14  Temp:    SpO2: 98% 98%    Last Pain:  Vitals:   04/09/18 0900  TempSrc: Tympanic  PainSc:                  [name]

## 2018-04-09 NOTE — Anesthesia Preprocedure Evaluation (Signed)
Anesthesia Evaluation  Patient identified by MRN, date of birth, ID band Patient awake    Reviewed: Allergy & Precautions, NPO status , Patient's Chart, lab work & pertinent test results  History of Anesthesia Complications Negative for: history of anesthetic complications  Airway Mallampati: III  TM Distance: <3 FB     Dental  (+) Upper Dentures, Lower Dentures, Dental Advidsory Given   Pulmonary neg shortness of breath, neg sleep apnea, neg COPD, neg recent URI, Current Smoker, former smoker,           Cardiovascular Exercise Tolerance: Poor hypertension, Pt. on medications (-) angina+ Peripheral Vascular Disease  (-) CAD, (-) Past MI, (-) Cardiac Stents and (-) CABG (-) dysrhythmias (-) Valvular Problems/Murmurs     Neuro/Psych neg Seizures PSYCHIATRIC DISORDERS Anxiety Depression Bipolar Disorder TIACVA, Residual Symptoms    GI/Hepatic Neg liver ROS, GERD  ,  Endo/Other  negative endocrine ROS  Renal/GU negative Renal ROS  negative genitourinary   Musculoskeletal  (+) Arthritis , Osteoarthritis,    Abdominal Normal abdominal exam  (+)   Peds negative pediatric ROS (+)  Hematology negative hematology ROS (+)   Anesthesia Other Findings Past Medical History: No date: Anxiety No date: Arthritis No date: Barrett's esophagus No date: Bipolar disorder (HCC) No date: Blocked artery     Comment:  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 No date: Blood clot in vein No date: Cancer [facility])     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 No date: Carotid arterial disease (HCC) 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 No date: Depression No date: Elevated lipids No date: GERD (gastroesophageal reflux disease) No date: Hypertension No date: Migraine No date: Osteoporosis No date: Poor balance No date: Sinus congestion No date: Stroke [facility])     Comment:  x 2 No date: TIA (transient ischemic attack) No date: Vertigo   Reproductive/Obstetrics                              Anesthesia Physical  Anesthesia Plan  ASA: III  Anesthesia Plan: General   Post-op Pain Management:    Induction: Intravenous  PONV Risk Score and Plan: 3 and Propofol infusion and TIVA  Airway Management Planned: Natural Airway and Nasal Cannula  Additional Equipment:   Intra-op Plan:   Post-operative Plan:   Informed Consent: I have reviewed the patients History and Physical, chart, labs and discussed the procedure including the risks, benefits and alternatives for the proposed anesthesia with the patient or authorized representative who has indicated his/her understanding and acceptance.     Plan Discussed with: Surgeon and CRNA  Anesthesia Plan Comments: (Discussed with the patient for possible need in postop period for interscalene block for pain control.  Patient understands risks and benefits and agrees to the block if needed.)        Anesthesia Quick Evaluation

## 2018-04-09 NOTE — Transfer of Care (Signed)
Immediate Anesthesia Transfer of Care Note  Patient: Sydney Evans  Procedure(s) Performed: COLONOSCOPY WITH PROPOFOL (N/A ) ESOPHAGOGASTRODUODENOSCOPY (EGD) WITH PROPOFOL (N/A )  Patient Location: Endoscopy Unit  Anesthesia Type:General  Level of Consciousness: sedated  Airway & Oxygen Therapy: Patient Spontanous Breathing and Patient connected to nasal cannula oxygen  Post-op Assessment: Report given to RN and Post -op Vital signs reviewed and stable  Post vital signs: Reviewed and stable  Last Vitals:  Vitals Value Taken Time  BP 95/47 04/09/2018  9:04 AM  Temp 36.1 C 04/09/2018  9:00 AM  Pulse 51 04/09/2018  9:06 AM  Resp 13 04/09/2018  9:06 AM  SpO2 99 % 04/09/2018  9:06 AM  Vitals shown include unvalidated device data.  Last Pain:  Vitals:   04/09/18 0900  TempSrc: Tympanic  PainSc:          Complications: No apparent anesthesia complications

## 2018-04-10 ENCOUNTER — Encounter: Payer: Self-pay | Admitting: Gastroenterology

## 2018-04-10 ENCOUNTER — Ambulatory Visit
Admission: RE | Admit: 2018-04-10 | Discharge: 2018-04-10 | Disposition: A | Discharge status: Home / Self Care | Payer: Medicare Other | Source: Ambulatory Visit | Attending: Gastroenterology | Admitting: Gastroenterology

## 2018-04-10 DIAGNOSIS — R933 Abnormal findings on diagnostic imaging of other parts of digestive tract: Secondary | ICD-10-CM | POA: Diagnosis present

## 2018-04-10 DIAGNOSIS — K5730 Diverticulosis of large intestine without perforation or abscess without bleeding: Secondary | ICD-10-CM | POA: Insufficient documentation

## 2018-04-10 LAB — SURGICAL PATHOLOGY

## 2018-04-10 MED ORDER — GADOBUTROL 1 MMOL/ML IV SOLN
7.0000 mL | Freq: Once | INTRAVENOUS | Status: AC | PRN
  Administered 2018-04-10: 7 mL via INTRAVENOUS

## 2018-04-13 ENCOUNTER — Ambulatory Visit: Payer: Medicare Other | Admitting: Oncology

## 2018-04-16 ENCOUNTER — Telehealth: Payer: Self-pay

## 2018-04-16 NOTE — Telephone Encounter (Signed)
Spoke with pt and informed her of MRI results. 

## 2018-04-16 NOTE — Telephone Encounter (Signed)
-----   Message from Jonathon Bellows, MD sent at 04/13/2018  8:00 AM EST ----- Inform normal MRE

## 2018-04-19 ENCOUNTER — Encounter: Payer: Self-pay | Admitting: Gastroenterology

## 2018-04-21 ENCOUNTER — Other Ambulatory Visit: Payer: Self-pay

## 2018-04-21 ENCOUNTER — Encounter: Payer: Self-pay | Admitting: Gastroenterology

## 2018-04-21 ENCOUNTER — Ambulatory Visit (INDEPENDENT_AMBULATORY_CARE_PROVIDER_SITE_OTHER): Payer: Medicare Other | Admitting: Gastroenterology

## 2018-04-21 VITALS — BP 137/63 | HR 57 | Ht 62.0 in | Wt 169.2 lb

## 2018-04-21 DIAGNOSIS — D509 Iron deficiency anemia, unspecified: Secondary | ICD-10-CM

## 2018-04-21 DIAGNOSIS — R933 Abnormal findings on diagnostic imaging of other parts of digestive tract: Secondary | ICD-10-CM

## 2018-04-21 NOTE — Progress Notes (Signed)
Jonathon Bellows MD, MRCP(U.K) [address]  [place], [place] [address]  Main: [phone]  Fax: [phone]   Primary Care Physician: [facility]  Primary Gastroenterologist:  Dr. Jonathon Bellows   No chief complaint on file.   HPI: Sydney Evans is a 67 y.o. female   Summary of history :  She is here today to follow up for iron deficiency anemia,diarrhea  .She was seen at the ER on 02/27/18 for abdominal pain.  A Ct scan of the abdomen was performed and it showed left lower quadrant small bowel loops with abnormal thickening non specific enteritis.    She has had a microcytic anemia which has gradually improved. Unclear etiology so far for the anemia. I do not see any GI work up . No blood in the urine. B12 206 -low normal.   Family history of thyroid issues.  She has had diarrhea for a few months. 3-4 times a day , with mucus, no blood.   She says she takes ibuprofen  2-3 times a day , for many years. She has had an EGD x 2 when she was 20 and 4-5 years back- recalls she had barretts esophagus. She suffers from reflux.   Last colonoscopy was 4 years and was normal - says may be had 1 polyp. Her dad had and uncles had colon cancer. Denies any blood in urine, no blood in her stool, no hematemesis or melena,presently has stool is yellow in color.     Interval history   03/12/2018-  04/21/2018  04/10/18 : MR enterogram, sigmoid diverticulosis.  03/16/18 : C diff negative,normal fecal calprotectin, H pylori breath test, Anti parietal cell ab, IF ab-negative  04/09/18 : WSF:KCLEX small angioectasias without bleeding were found in the duodenal bulb and in the third portion of the duodenum. Coagulation for bleeding prevention using argon plasma . Colonosocpy was normal  Normal duodenal bx.    Some diarrhea only in the morning. Tried some fiber and imodium which help. Takes as needed. Diarrhea not all the time.  No further abdominal pain.  Certain foods like lettuce "do not digest", cause diarrhea. Also occurs for brocolli and brussels sprouts. On b12 shots.   Current Outpatient Medications  Medication Sig Dispense Refill  . aspirin EC 325 MG tablet Take 1 tablet (325 mg total) by mouth daily. 30 tablet 3  . atorvastatin (LIPITOR) 40 MG tablet Take 1 tablet (40 mg total) by mouth daily at 6 PM. (Patient not taking: Reported on 03/12/2018) 30 tablet 2  . Carboxymethylcellulose Sodium (LUBRICANT EYE DROPS OP) Place 1 drop into both eyes daily.    . citalopram (CELEXA) 40 MG tablet Take 40 mg by mouth daily.    . diphenhydrAMINE (BENADRYL) 25 MG tablet Take 25 mg by mouth every 6 (six) hours as needed for itching.    . fluticasone (FLONASE) 50 MCG/ACT nasal spray Place 2 sprays into both nostrils daily as needed for allergies or rhinitis.    . hydrochlorothiazide (HYDRODIURIL) 25 MG tablet Take 25 mg by mouth daily.    [name] HYDROmorphone (DILAUDID) 2 MG tablet Take 0.5 tablets (1 mg total) by mouth every 12 (twelve) hours as needed for severe pain. (Patient not taking: Reported on 03/12/2018) 4 tablet 0  . IBU 800 MG tablet     . lamoTRIgine (LAMICTAL) 200 MG tablet Take 200 mg by mouth 2 (two) times daily.    [name] latanoprost (XALATAN) 0.005 % ophthalmic solution Place 2 drops into  both eyes at bedtime.     [name] lisinopril (PRINIVIL,ZESTRIL) 40 MG tablet Take 40 mg by mouth daily.    . metoCLOPramide (REGLAN) 5 MG tablet Take 1 tablet (5 mg total) by mouth 4 (four) times daily as needed for nausea. May also take for headache. (Patient not taking: Reported on 03/12/2018) 30 tablet 6  . Multiple Vitamin (MULTIVITAMIN WITH MINERALS) TABS tablet Take 1 tablet by mouth daily. One-A-Day Multivitamin for Women 50+    . omeprazole (PRILOSEC) 40 MG capsule Take 40 mg by mouth daily.    . ondansetron (ZOFRAN) 4 MG tablet Take 1 tablet (4 mg total) by mouth daily as needed for nausea or vomiting. 30 tablet 1   No current facility-administered medications  for this visit.     Allergies as of 04/21/2018 - Review Complete 04/09/2018  Allergen Reaction Noted  . Hydroxyzine Hives and Rash 01/20/2018  . Gabapentin  08/31/2015  . Ketorolac Other (See Comments)   . Hydrocodone Hives and Rash 07/22/2016  . Percocet [oxycodone-acetaminophen] Itching and Rash 12/22/2014  . Toradol [ketorolac tromethamine] Rash 08/01/2016    ROS:  General: Negative for anorexia, weight loss, fever, chills, fatigue, weakness. ENT: Negative for hoarseness, difficulty swallowing , nasal congestion. CV: Negative for chest pain, angina, palpitations, dyspnea on exertion, peripheral edema.  Respiratory: Negative for dyspnea at rest, dyspnea on exertion, cough, sputum, wheezing.  GI: See history of present illness. GU:  Negative for dysuria, hematuria, urinary incontinence, urinary frequency, nocturnal urination.  Endo: Negative for unusual weight change.    Physical Examination:   BP 137/63   Pulse (!) 57   Ht 5\' 2"  (1.575 m)   Wt 169 lb 3.2 oz (76.7 kg)   BMI 30.95 kg/m   General: Well-nourished, well-developed in no acute distress.  Eyes: No icterus. Conjunctivae pink. Mouth: Oropharyngeal mucosa moist and pink , no lesions erythema or exudate. Lungs: Clear to auscultation bilaterally. Non-labored. Heart: Regular rate and rhythm, no murmurs rubs or gallops.  Abdomen: Bowel sounds are normal, nontender, nondistended, no hepatosplenomegaly or masses, no abdominal bruits or hernia , no rebound or guarding.   Extremities: No lower extremity edema. No clubbing or deformities. Neuro: Alert and oriented x 3.  Grossly intact. Skin: Warm and dry, no jaundice.   Psych: Alert and cooperative, normal mood and affect.   Imaging Studies: Mr [place] Abdomen W Wo Contrast  Result Date: 04/10/2018 CLINICAL DATA:  Postprandial abdominal pain x1 month EXAM: MR ABDOMEN AND PELVIS WITHOUT AND WITH CONTRAST (MR ENTEROGRAPHY) TECHNIQUE: Multiplanar, multisequence MRI of the  abdomen and pelvis was performed both before and during bolus administration of intravenous contrast. Negative oral contrast VoLumen was given. CONTRAST:  7 mL Gadovist IV COMPARISON:  CT abdomen/pelvis dated 02/27/2018 FINDINGS: Lower chest: Lung bases are clear. Hepatobiliary: Liver is within normal limits. No suspicious/enhancing hepatic lesions. Status post cholecystectomy. No intrahepatic or extrahepatic ductal dilatation. Pancreas:  Within normal limits. Spleen:  Within normal limits. Adrenals/Urinary Tract:  Adrenal glands are within normal limits. Kidneys are within normal limits.  No hydronephrosis. Bladder is within normal limits. Stomach/Bowel: Stomach is within normal limits. No small bowel wall thickening or inflammatory changes. No evidence of bowel obstruction. Normal appendix (series 4/image 4). Mild sigmoid diverticulosis. No colonic wall thickening or inflammatory changes. Vascular/Lymphatic:  No evidence of abdominal aortic aneurysm. No suspicious abdominopelvic lymphadenopathy Reproductive: Status post hysterectomy. No adnexal masses. Other:  No abdominopelvic ascites. Musculoskeletal: Mild degenerative changes of the lower lumbar spine. IMPRESSION: No bowel wall  thickening or inflammatory changes. No evidence of bowel obstruction.  Normal appendix. Sigmoid diverticulosis, without evidence of diverticulitis. Electronically Signed   By: Julian Hy M.D.   On: 04/10/2018 14:23   Mr [place] Pelvis W Wo Contrast  Result Date: 04/10/2018 CLINICAL DATA:  Postprandial abdominal pain x1 month EXAM: MR ABDOMEN AND PELVIS WITHOUT AND WITH CONTRAST (MR ENTEROGRAPHY) TECHNIQUE: Multiplanar, multisequence MRI of the abdomen and pelvis was performed both before and during bolus administration of intravenous contrast. Negative oral contrast VoLumen was given. CONTRAST:  7 mL Gadovist IV COMPARISON:  CT abdomen/pelvis dated 02/27/2018 FINDINGS: Lower chest: Lung bases are clear. Hepatobiliary: Liver is  within normal limits. No suspicious/enhancing hepatic lesions. Status post cholecystectomy. No intrahepatic or extrahepatic ductal dilatation. Pancreas:  Within normal limits. Spleen:  Within normal limits. Adrenals/Urinary Tract:  Adrenal glands are within normal limits. Kidneys are within normal limits.  No hydronephrosis. Bladder is within normal limits. Stomach/Bowel: Stomach is within normal limits. No small bowel wall thickening or inflammatory changes. No evidence of bowel obstruction. Normal appendix (series 4/image 4). Mild sigmoid diverticulosis. No colonic wall thickening or inflammatory changes. Vascular/Lymphatic:  No evidence of abdominal aortic aneurysm. No suspicious abdominopelvic lymphadenopathy Reproductive: Status post hysterectomy. No adnexal masses. Other:  No abdominopelvic ascites. Musculoskeletal: Mild degenerative changes of the lower lumbar spine. IMPRESSION: No bowel wall thickening or inflammatory changes. No evidence of bowel obstruction.  Normal appendix. Sigmoid diverticulosis, without evidence of diverticulitis. Electronically Signed   By: Julian Hy M.D.   On: 04/10/2018 14:23    Assessment and Plan:   Chari Parmenter is a 67 y.o. 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 Also has borderline low B12.   Plan  1. Low b12 at 206- continue replacement  2. No NSAID's 3. IV iron with Dr Tasia Catchings 4. . No NSAID's 5. Can stop PPI- if has symptoms then restart  6. Capsule study of the small bowel   Risks, benefits, alternatives of Givens capsule discussed with patient to include but not limited to the rare risk of Given's capsule becoming lodged in the GI tract requiring surgical removal.  The patient agrees with this plan & consent will be obtained.   Dr Jonathon Bellows  MD,MRCP [facility] Follow up in 6 weeks .

## 2018-04-28 ENCOUNTER — Ambulatory Visit: Payer: Medicare Other | Admitting: Gastroenterology

## 2018-05-07 ENCOUNTER — Telehealth: Payer: Self-pay

## 2018-05-07 NOTE — Telephone Encounter (Signed)
Pt called to cancel upcoming Capsule Study exam due to financial reasons. Pt states she plans to contact our office to reschedule in a few weeks.

## 2018-05-12 ENCOUNTER — Ambulatory Visit: Admit: 2018-05-12 | Payer: Medicare Other | Admitting: Gastroenterology

## 2018-05-12 SURGERY — IMAGING PROCEDURE, GI TRACT, INTRALUMINAL, VIA CAPSULE

## 2018-06-02 ENCOUNTER — Ambulatory Visit (INDEPENDENT_AMBULATORY_CARE_PROVIDER_SITE_OTHER): Payer: Medicare Other | Admitting: Gastroenterology

## 2018-06-02 ENCOUNTER — Encounter: Payer: Self-pay | Admitting: Gastroenterology

## 2018-06-02 ENCOUNTER — Other Ambulatory Visit: Payer: Self-pay

## 2018-06-02 VITALS — BP 173/81 | HR 55 | Ht 62.0 in | Wt 174.6 lb

## 2018-06-02 DIAGNOSIS — D509 Iron deficiency anemia, unspecified: Secondary | ICD-10-CM | POA: Diagnosis not present

## 2018-06-02 NOTE — Progress Notes (Signed)
Jonathon Bellows MD, MRCP(U.K) [address]  [place]  [place], [place] [address]  Main: [phone]  Fax: [phone]   Primary Care Physician: [facility]  Primary Gastroenterologist:  Dr. Jonathon Bellows   Chief Complaint  Patient presents with  . Follow-up Iron Def. Anemia    HPI: Sydney Evans is a 68 y.o. female    Summary of history :  She is here today to follow up for iron deficiency anemia,diarrhea  She was seen at the ER on 02/27/18 for abdominal pain.  A Ct scan of the abdomen was performed and it showed left lower quadrant small bowel loops with abnormal thickening non specific enteritis.   She has had a microcytic anemia which has gradually improved.She says she took  ibuprofen  2-3 times a day , for many years.  Denies any blood in urine, no blood in her stool, no hematemesis or melena,presently has stool is yellow in color.  No blood in the urine. B12 206 -low normal.  Family history of thyroid issues.  She has had diarrhea for a few months. 3-4 times a day , with mucus, no blood.   04/10/18 : MR enterogram, sigmoid diverticulosis.  03/16/18 : C diff negative,normal fecal calprotectin, H pylori breath test, Anti parietal cell ab, IF ab-negative  04/09/18 : ZPH:XTAVW small angioectasias without bleeding were found in the duodenal bulb and in the third portion of the duodenum. Coagulation for bleeding prevention using argon plasma . Colonosocpy was normal  Normal duodenal bx.   H pylori, parietal cell ab, IF ab , stool C dioff, PCR- negative.   Interval history   03/12/2018-  04/21/2018  Did not obtain capsule study of the small bowel for financial reasons. Says says she is ready for the capsule study now, No diarrhea.  On b12 shots.No other complaints.      Current Outpatient Medications  Medication Sig Dispense Refill  . aspirin EC 325 MG tablet Take 1 tablet (325 mg total) by mouth daily. 30 tablet 3  . atorvastatin  (LIPITOR) 40 MG tablet Take 1 tablet (40 mg total) by mouth daily at 6 PM. 30 tablet 2  . Carboxymethylcellulose Sodium (LUBRICANT EYE DROPS OP) Place 1 drop into both eyes daily.    . citalopram (CELEXA) 40 MG tablet Take 40 mg by mouth daily.    . fluticasone (FLONASE) 50 MCG/ACT nasal spray Place 2 sprays into both nostrils daily as needed for allergies or rhinitis.    . hydrochlorothiazide (HYDRODIURIL) 25 MG tablet Take 25 mg by mouth daily.    [name] HYDROmorphone (DILAUDID) 2 MG tablet Take 0.5 tablets (1 mg total) by mouth every 12 (twelve) hours as needed for severe pain. 4 tablet 0  . IBU 800 MG tablet     . lamoTRIgine (LAMICTAL) 200 MG tablet Take 200 mg by mouth 2 (two) times daily.    [name] latanoprost (XALATAN) 0.005 % ophthalmic solution Place 2 drops into both eyes at bedtime.     [name] lisinopril (PRINIVIL,ZESTRIL) 40 MG tablet Take 40 mg by mouth daily.    . metoCLOPramide (REGLAN) 5 MG tablet Take 1 tablet (5 mg total) by mouth 4 (four) times daily as needed for nausea. May also take for headache. 30 tablet 6  . Multiple Vitamin (MULTIVITAMIN WITH MINERALS) TABS tablet Take 1 tablet by mouth daily. One-A-Day Multivitamin for Women 50+    . omeprazole (PRILOSEC) 40 MG capsule Take 40 mg by mouth daily.    [name]  ondansetron (ZOFRAN) 4 MG tablet Take 1 tablet (4 mg total) by mouth daily as needed for nausea or vomiting. 30 tablet 1  . diphenhydrAMINE (BENADRYL) 25 MG tablet Take 25 mg by mouth every 6 (six) hours as needed for itching.     No current facility-administered medications for this visit.     Allergies as of 06/02/2018 - Review Complete 06/02/2018  Allergen Reaction Noted  . Hydroxyzine Hives and Rash 01/20/2018  . Gabapentin  08/31/2015  . Ketorolac Other (See Comments)   . Hydrocodone Hives and Rash 07/22/2016  . Percocet [oxycodone-acetaminophen] Itching and Rash 12/22/2014  . Toradol [ketorolac tromethamine] Rash 08/01/2016    ROS:  General: Negative for anorexia, weight  loss, fever, chills, fatigue, weakness. ENT: Negative for hoarseness, difficulty swallowing , nasal congestion. CV: Negative for chest pain, angina, palpitations, dyspnea on exertion, peripheral edema.  Respiratory: Negative for dyspnea at rest, dyspnea on exertion, cough, sputum, wheezing.  GI: See history of present illness. GU:  Negative for dysuria, hematuria, urinary incontinence, urinary frequency, nocturnal urination.  Endo: Negative for unusual weight change.    Physical Examination:   BP (!) 201/77   Ht 5\' 2"  (1.575 m)   Wt 174 lb 9.6 oz (79.2 kg)   BMI 31.93 kg/m   General: Well-nourished, well-developed in no acute distress.  Eyes: No icterus. Conjunctivae pink. Mouth: Oropharyngeal mucosa moist and pink , no lesions erythema or exudate. Lungs: Clear to auscultation bilaterally. Non-labored. Heart: Regular rate and rhythm, no murmurs rubs or gallops.  Abdomen: Bowel sounds are normal, nontender, nondistended, no hepatosplenomegaly or masses, no abdominal bruits or hernia , no rebound or guarding.   Extremities: No lower extremity edema. No clubbing or deformities. Neuro: Alert and oriented x 3.  Grossly intact. Skin: Warm and dry, no jaundice.   Psych: Alert and cooperative, normal mood and affect.   Imaging Studies: No results found.  Assessment and Plan:   Sydney Evans is a 68 y.o. 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 Also has borderline low B12. Likely anemia from a combination of AVM's and NSAID use. Doing well . Elevated BP , suggested she see her PCP today, will need more medications, denies any chest pain, headaches . Rechecked BP was 161 systolic.   Plan  1. Low b12 at 206- continue replacement  2. No NSAID's 3. IV iron with Dr Tasia Catchings 4. No NSAID's 5. Can stop PPI- if has symptoms then restart  6. Capsule study of the small bowel  7. CBCD today    Risks, benefits, alternatives of Givens capsule discussed with patient to include but not limited to the rare risk of Given's capsule becoming lodged in the GI tract requiring surgical removal.  The patient agrees with this plan & consent will be obtained.  Dr Jonathon Bellows  MD,MRCP [facility]) Follow up in 6 weeks

## 2018-06-03 LAB — CBC
Hematocrit: 34 % (ref 34.0–46.6)
Hemoglobin: 10.8 g/dL — ABNORMAL LOW (ref 11.1–15.9)
MCH: 25.8 pg — ABNORMAL LOW (ref 26.6–33.0)
MCHC: 31.8 g/dL (ref 31.5–35.7)
MCV: 81 fL (ref 79–97)
Platelets: 352 10*3/uL (ref 150–450)
RBC: 4.18 x10E6/uL (ref 3.77–5.28)
RDW: 14.1 % (ref 11.7–15.4)
WBC: 4.7 10*3/uL (ref 3.4–10.8)

## 2018-06-05 ENCOUNTER — Encounter: Payer: Self-pay | Admitting: Gastroenterology

## 2018-06-15 ENCOUNTER — Telehealth: Payer: Self-pay | Admitting: Gastroenterology

## 2018-06-15 ENCOUNTER — Encounter: Payer: Self-pay | Admitting: *Deleted

## 2018-06-15 NOTE — Telephone Encounter (Signed)
Patient called & is schedule for Capsule Study with Dr Vicente Males for 06-16-2018 has questions about prep. Please call

## 2018-06-16 ENCOUNTER — Encounter
Admission: RE | Disposition: A | Discharge status: Home / Self Care | Payer: Self-pay | Source: Ambulatory Visit | Attending: Gastroenterology

## 2018-06-16 ENCOUNTER — Ambulatory Visit
Admission: RE | Admit: 2018-06-16 | Discharge: 2018-06-16 | Disposition: A | Discharge status: Home / Self Care | Payer: Medicare Other | Source: Ambulatory Visit | Attending: Gastroenterology | Admitting: Gastroenterology

## 2018-06-16 DIAGNOSIS — K319 Disease of stomach and duodenum, unspecified: Secondary | ICD-10-CM | POA: Diagnosis not present

## 2018-06-16 DIAGNOSIS — D509 Iron deficiency anemia, unspecified: Secondary | ICD-10-CM | POA: Diagnosis present

## 2018-06-16 HISTORY — PX: GIVENS CAPSULE STUDY: SHX5432

## 2018-06-16 SURGERY — IMAGING PROCEDURE, GI TRACT, INTRALUMINAL, VIA CAPSULE

## 2018-06-16 NOTE — OR Nursing (Signed)
Pt presents for givens capsule study, identified pt, consent signed, this RN went over instructions and gave pt written instructions,  Pt to return at 4:00 pm to return equipment.

## 2018-06-17 ENCOUNTER — Encounter: Payer: Self-pay | Admitting: Gastroenterology

## 2018-06-30 ENCOUNTER — Ambulatory Visit (INDEPENDENT_AMBULATORY_CARE_PROVIDER_SITE_OTHER): Payer: Medicare Other | Admitting: Podiatry

## 2018-06-30 ENCOUNTER — Encounter: Payer: Self-pay | Admitting: Podiatry

## 2018-06-30 DIAGNOSIS — M79676 Pain in unspecified toe(s): Secondary | ICD-10-CM

## 2018-06-30 DIAGNOSIS — B351 Tinea unguium: Secondary | ICD-10-CM | POA: Diagnosis not present

## 2018-06-30 DIAGNOSIS — L989 Disorder of the skin and subcutaneous tissue, unspecified: Secondary | ICD-10-CM | POA: Diagnosis not present

## 2018-07-03 NOTE — Progress Notes (Signed)
    Subjective: Patient is a 68 y.o. female presenting to the office today with a chief complaint of painful callus lesions to the bilateral third toes that have been present for the past several years. Walking and bearing weight increases the pain. She has not had any recent treatment.   Patient also complains of elongated, thickened nails that cause pain while ambulating in shoes. She is unable to trim her own nails. Patient presents today for further treatment and evaluation.  Past Medical History:  Diagnosis Date  . Anxiety   . Arthritis   . Barrett's esophagus   . Bipolar disorder ([place])   . 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 Blood clot in vein   . Cancer ([place])    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  . Carotid arterial disease ([place])   . Contracture of joint of upper arm   . Depression   . Elevated lipids   . GERD (gastroesophageal reflux disease)   . Hypertension   . Migraine   . Osteoporosis   . Poor balance   . Sinus congestion   . 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 TIA (transient ischemic attack)   . Vertigo     Objective:  Physical Exam General: Alert and oriented x3 in no acute distress  Dermatology: Hyperkeratotic lesion present on the bilateral third toes. Pain on palpation with a central nucleated core noted. Skin is warm, dry and supple bilateral lower extremities. Negative for open lesions or macerations. Nails are tender, long, thickened and dystrophic with subungual debris, consistent with onychomycosis, 1-5 bilateral. No signs of infection noted.  Vascular: Palpable pedal pulses bilaterally. No edema or erythema noted. Capillary refill within normal limits.  Neurological: Epicritic and protective threshold grossly intact bilaterally.   Musculoskeletal Exam: Pain on palpation at the keratotic lesion noted. Range of motion within normal limits bilateral. Muscle strength 5/5 in all groups bilateral.  Assessment: 1. Onychodystrophic nails 1-5 bilateral with hyperkeratosis of nails.  2.  Onychomycosis of nail due to dermatophyte bilateral 3. Pre-ulcerative callus lesions noted to the bilateral 3rd toes   Plan of Care:  1. Patient evaluated. 2. Excisional debridement of keratoic lesion using a chisel blade was performed without incident.  3. Dressed with light dressing. 4. Mechanical debridement of nails 1-5 bilaterally performed using a nail nipper. Filed with dremel without incident.  5. Patient is to return to the clinic in 3 months.   Service dog's name is Dominica.   Edrick Kins, DPM [facility]  Dr. Edrick Kins, [place]                                        [place], [place] [address]                Office [phone]  Fax [phone]

## 2018-07-09 ENCOUNTER — Other Ambulatory Visit: Payer: Self-pay

## 2018-07-09 ENCOUNTER — Encounter (INDEPENDENT_AMBULATORY_CARE_PROVIDER_SITE_OTHER): Payer: Medicare Other | Admitting: Gastroenterology

## 2018-07-09 DIAGNOSIS — D509 Iron deficiency anemia, unspecified: Secondary | ICD-10-CM

## 2018-07-29 ENCOUNTER — Other Ambulatory Visit: Payer: Medicare Other | Admitting: Orthotics

## 2018-08-01 ENCOUNTER — Other Ambulatory Visit: Payer: Self-pay

## 2018-08-01 ENCOUNTER — Emergency Department: Payer: Medicare Other

## 2018-08-01 ENCOUNTER — Emergency Department
Admission: EM | Admit: 2018-08-01 | Discharge: 2018-08-01 | Disposition: A | Discharge status: Home / Self Care | Payer: Medicare Other | Attending: Emergency Medicine | Admitting: Emergency Medicine

## 2018-08-01 DIAGNOSIS — Y9389 Activity, other specified: Secondary | ICD-10-CM | POA: Insufficient documentation

## 2018-08-01 DIAGNOSIS — Z96612 Presence of left artificial shoulder joint: Secondary | ICD-10-CM | POA: Insufficient documentation

## 2018-08-01 DIAGNOSIS — Y92009 Unspecified place in unspecified non-institutional (private) residence as the place of occurrence of the external cause: Secondary | ICD-10-CM

## 2018-08-01 DIAGNOSIS — W19XXXA Unspecified fall, initial encounter: Secondary | ICD-10-CM | POA: Diagnosis not present

## 2018-08-01 DIAGNOSIS — S62647A Nondisplaced fracture of proximal phalanx of left little finger, initial encounter for closed fracture: Secondary | ICD-10-CM

## 2018-08-01 DIAGNOSIS — Y92018 Other place in single-family (private) house as the place of occurrence of the external cause: Secondary | ICD-10-CM | POA: Diagnosis not present

## 2018-08-01 DIAGNOSIS — S6992XA Unspecified injury of left wrist, hand and finger(s), initial encounter: Secondary | ICD-10-CM | POA: Diagnosis present

## 2018-08-01 DIAGNOSIS — Y998 Other external cause status: Secondary | ICD-10-CM | POA: Diagnosis not present

## 2018-08-01 DIAGNOSIS — T07XXXA Unspecified multiple injuries, initial encounter: Secondary | ICD-10-CM | POA: Diagnosis not present

## 2018-08-01 DIAGNOSIS — Z7982 Long term (current) use of aspirin: Secondary | ICD-10-CM | POA: Diagnosis not present

## 2018-08-01 DIAGNOSIS — Z87891 Personal history of nicotine dependence: Secondary | ICD-10-CM | POA: Insufficient documentation

## 2018-08-01 DIAGNOSIS — S0003XA Contusion of scalp, initial encounter: Secondary | ICD-10-CM | POA: Diagnosis not present

## 2018-08-01 DIAGNOSIS — Z96652 Presence of left artificial knee joint: Secondary | ICD-10-CM | POA: Insufficient documentation

## 2018-08-01 DIAGNOSIS — Z8542 Personal history of malignant neoplasm of other parts of uterus: Secondary | ICD-10-CM | POA: Insufficient documentation

## 2018-08-01 DIAGNOSIS — Z8673 Personal history of transient ischemic attack (TIA), and cerebral infarction without residual deficits: Secondary | ICD-10-CM | POA: Diagnosis not present

## 2018-08-01 DIAGNOSIS — Z96651 Presence of right artificial knee joint: Secondary | ICD-10-CM | POA: Insufficient documentation

## 2018-08-01 DIAGNOSIS — R52 Pain, unspecified: Secondary | ICD-10-CM

## 2018-08-01 DIAGNOSIS — Z79899 Other long term (current) drug therapy: Secondary | ICD-10-CM | POA: Diagnosis not present

## 2018-08-01 NOTE — ED Provider Notes (Signed)
[facility] Emergency Department Provider Note   ____________________________________________   First MD Initiated Contact with Patient 08/01/18 1047     (approximate)  I have reviewed the triage vital signs and the nursing notes.   HISTORY  Chief Complaint Fall   HPI Sydney Evans is a 68 y.o. female presents to the ED with complaint of left hand, forearm and shoulder pain after she fell approximately 5 days ago.  Patient states she also hit the side of her head.  Patient states that her young granddaughter was the only person in the house and she is not aware of any LOC, she denies any nausea, vomiting or visual changes.  Patient continues to have pain with her hand despite over-the-counter medication.  She states that her scalp continues to be sore.  She states that she lost her balance causing her to fall, and not a syncopal episode.  She rates her pain as a 4 out of 10.     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   TIA (transient ischemic attack)    Vertigo     Patient Active Problem List   Diagnosis Date Noted   Post-concussion headache 01/20/2018   Iron deficiency anemia 10/05/2017   Carotid stenosis 11/17/2016   GERD (gastroesophageal reflux disease) 11/17/2016   Essential hypertension 11/17/2016   Hyperlipidemia 11/17/2016   Status post total shoulder replacement, 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   Rotator cuff tendinitis, left 05/20/2016   History of TIAs 07/17/2015   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/2015   TIA (transient ischemic attack) 12/23/2014   Localized, 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   Disorder of bone and articular cartilage 09/29/2014    Past Surgical History:  Procedure Laterality Date   ABDOMINAL HYSTERECTOMY     BACK SURGERY     BREAST BIOPSY Left    CARPAL TUNNEL RELEASE Bilateral    CATARACT EXTRACTION W/ INTRAOCULAR LENS  IMPLANT, BILATERAL Bilateral    CHOLECYSTECTOMY     COLONOSCOPY WITH PROPOFOL N/A 04/09/2018   Procedure: COLONOSCOPY WITH PROPOFOL;  Surgeon: Jonathon Bellows, MD;  Location: [facility] ENDOSCOPY;  Service: Gastroenterology;  Laterality: N/A;   crystal cyst removed on left foot     ESOPHAGOGASTRODUODENOSCOPY (EGD) WITH PROPOFOL N/A 04/09/2018   Procedure: ESOPHAGOGASTRODUODENOSCOPY (EGD) WITH PROPOFOL;  Surgeon: Jonathon Bellows, MD;  Location: [facility] ENDOSCOPY;  Service: Gastroenterology;  Laterality: N/A;   femoral fx     FOOT SURGERY     GIVENS CAPSULE STUDY N/A 06/16/2018   Procedure: GIVENS CAPSULE STUDY;  Surgeon: Jonathon Bellows, MD;  Location: [facility] ENDOSCOPY;  Service: Gastroenterology;  Laterality: N/A;   KNEE SURGERY     TONSILLECTOMY     TOTAL KNEE ARTHROPLASTY Bilateral    TOTAL SHOULDER ARTHROPLASTY Left 08/01/2016   Procedure: TOTAL SHOULDER ARTHROPLASTY;  Surgeon: Corky Mull, MD;  Location: [facility] ORS;  Service: Orthopedics;  Laterality: Left;    Prior to Admission medications   Medication Sig Start Date End Date Taking? Authorizing Provider  aspirin EC 325 MG tablet Take 1 tablet (325 mg total) by mouth daily. 12/23/14   Gladstone Lighter, MD  atorvastatin (LIPITOR) 40 MG tablet Take 1 tablet (40 mg total) by mouth daily at 6 PM. 12/23/14   Gladstone Lighter, MD  Carboxymethylcellulose Sodium (LUBRICANT EYE DROPS OP) Place 1 drop into both eyes daily.    [provider]  citalopram (CELEXA) 40 MG tablet Take 40 mg by mouth daily.    [provider]  diphenhydrAMINE (BENADRYL) 25 MG  tablet Take 25 mg by mouth every 6 (six) hours as needed for itching.    [provider]  fluticasone (FLONASE) 50 MCG/ACT nasal spray Place 2 sprays into both nostrils daily as needed for allergies or rhinitis.    [provider]  hydrochlorothiazide (HYDRODIURIL) 25 MG tablet Take 25 mg by mouth daily. 01/16/18   [provider]  HYDROmorphone (DILAUDID) 2 MG tablet Take 0.5 tablets (1 mg total) by mouth every 12 (twelve) hours as needed for severe pain. 02/27/18 02/27/19  Delman Kitten, MD  IBU 800 MG tablet  09/25/17   [provider]  lamoTRIgine (LAMICTAL) 200 MG tablet Take 200 mg by mouth 2 (two) times daily.    [provider]  latanoprost (XALATAN) 0.005 % ophthalmic solution Place 2 drops into both eyes at bedtime.     [provider]  lisinopril (PRINIVIL,ZESTRIL) 40 MG tablet Take 40 mg by mouth daily.    [provider]  metoCLOPramide (REGLAN) 5 MG tablet Take 1 tablet (5 mg total) by mouth 4 (four) times daily as needed for nausea. May also take for headache. 01/20/18   Melvenia Beam, MD  Multiple Vitamin (MULTIVITAMIN WITH MINERALS) TABS tablet Take 1 tablet by mouth daily. One-A-Day Multivitamin for Women 50+    [provider]  omeprazole (PRILOSEC) 40 MG capsule Take 40 mg by mouth daily.    [provider]  ondansetron (ZOFRAN) 4 MG tablet Take 1 tablet (4 mg total) by mouth daily as needed for nausea or vomiting. 02/27/18 02/27/19  Delman Kitten, MD    Allergies Hydroxyzine; Gabapentin; Ketorolac; Hydrocodone; Percocet [oxycodone-acetaminophen]; and Toradol [ketorolac tromethamine]  Family History  Problem Relation Age of Onset   Other Mother        ?lupus    Cancer Mother    High Cholesterol Mother    CAD Father        CABG   High Cholesterol Father    Arthritis Father    Cancer Sister    Heart murmur Sister    Heart murmur Brother    High blood pressure Other        "for  everybody"   High Cholesterol Other        "for everybody"    Social History Social History   Tobacco Use   Smoking status: Former Smoker    Packs/day: 0.25    Types: Cigarettes    Last attempt to quit: 08/04/2016    Years since quitting: 1.9   Smokeless tobacco: Never Used  Substance Use Topics   Alcohol use: No   Drug use: No    Review of Systems Constitutional: No fever/chills Eyes: No visual changes. ENT: No trauma. Cardiovascular: Denies chest pain. Respiratory: Denies shortness of breath. Gastrointestinal: No abdominal pain.  No nausea, no vomiting. Musculoskeletal: Positive for left hand, left forearm and  left arm pain.  Negative for back pain. Skin: Negative for rash. Neurological: Negative for headaches, focal weakness or numbness. ___________________________________________   PHYSICAL EXAM:  VITAL SIGNS: ED Triage Vitals  Enc Vitals Group     BP 08/01/18 1035 (!) 188/78     Pulse Rate 08/01/18 1035 (!) 57     Resp 08/01/18 1035 14     Temp 08/01/18 1035 98.2 F (36.8 C)     Temp Source 08/01/18 1035 Oral     SpO2 08/01/18 1035 100 %     Weight 08/01/18 1034 167 lb (75.8 kg)     Height --      Head Circumference --      Peak Flow --      Pain Score 08/01/18 1034 4     Pain Loc --      Pain Edu? --      Excl. in [place]? --    Constitutional: Alert and oriented. Well appearing and in no acute distress.  Patient is ambulatory without assistance. Eyes: Conjunctivae are normal. PERRL. EOMI. Head: On examination there is some  tenderness on palpation of the left lateral scalp area without ecchymosis noted.  No skin disruption. Nose: No trauma. Neck: No stridor.  No cervical tenderness on palpation posteriorly.  Range of motion without restriction. Cardiovascular: Normal rate, regular rhythm. Grossly normal heart sounds.  Good peripheral circulation. Respiratory: Normal respiratory effort.  No retractions. Lungs CTAB. Gastrointestinal: Soft and nontender.  No distention. Musculoskeletal: On examination of the left upper extremity there is moderate tenderness on palpation of the digits and left hand.  No gross deformity is noted.  No deformity is noted of the wrist and forearm.  Minimal tenderness is noted on palpation of the left humerus without soft tissue injury or edema.  Range of motion is restricted secondary to patient's pain.  Nontender thoracic or lumbar spine.  Nontender lower extremities. Neurologic:  Normal speech and language. No gross focal neurologic deficits are appreciated. No gait instability. Skin:  Skin is warm, dry and intact. No rash noted. Psychiatric: Mood and affect are normal. Speech and behavior are normal.  ____________________________________________   LABS (all labs ordered are listed, but only abnormal results are displayed)  Labs Reviewed - No data to display  RADIOLOGY  Official radiology report(s): Dg Forearm Left  Result Date: 08/01/2018 CLINICAL DATA:  Fall with bruising to left elbow and shoulder. History of left shoulder arthroplasty. EXAM: LEFT FOREARM - 2 VIEW COMPARISON:  None. FINDINGS: There is no evidence of fracture or other focal bone lesions. No significant arthropathy visualized. Soft tissues are unremarkable. IMPRESSION: Negative. Electronically Signed   By: Aletta Edouard M.D.   On: 08/01/2018 12:12   Ct Head Wo Contrast  Result Date: 08/01/2018 CLINICAL DATA:  Fall, dizziness, headache EXAM: CT HEAD WITHOUT CONTRAST TECHNIQUE: Contiguous axial images were obtained from the base of the skull through the vertex without intravenous contrast. COMPARISON:  05/30/2017 FINDINGS: Brain: No evidence of acute infarction, hemorrhage, hydrocephalus, extra-axial collection or mass lesion/mass effect. Vascular: Intracranial atherosclerosis. Skull: Normal. Negative for fracture or focal lesion. Sinuses/Orbits: The visualized paranasal sinuses are essentially clear. The mastoid air cells are unopacified. Other:  None. IMPRESSION: No evidence of acute intracranial abnormality. Small vessel ischemic changes. Electronically Signed   By: Julian Hy M.D.   On: 08/01/2018 11:39   Dg Humerus Left  Result Date: 08/01/2018 CLINICAL DATA:  Fall with bruising to left elbow and shoulder. History of left shoulder arthroplasty. EXAM:  LEFT HUMERUS - 2+ VIEW COMPARISON:  08/01/2016 FINDINGS: Stable appearance and positioning of left shoulder arthroplasty without evidence of acute fracture, dislocation or abnormal lucency surrounding hardware. Soft tissues are unremarkable. No bony lesions identified. IMPRESSION: No acute fracture identified. Stable appearance of left shoulder arthroplasty. Electronically Signed   By: Aletta Edouard M.D.   On: 08/01/2018 12:13   Dg Hand Complete Left  Result Date: 08/01/2018 CLINICAL DATA:  Fall 5 days ago, pain and swelling along the left hand EXAM: LEFT HAND - COMPLETE 3+ VIEW COMPARISON:  None. FINDINGS: Oblique nondisplaced fracture of the proximal metaphysis of the proximal phalanx small finger probably extending into the base of the proximal phalanx. Degenerative loss of articular space and subcortical sclerosis at the articulation of the scaphoid with the trapezium and trapezoid. Associated spurring noted. There is likewise spurring at the first carpometacarpal articulation. IMPRESSION: 1. Acute oblique nondisplaced fracture of the proximal metaphysis proximal phalanx small finger probably extending into the base of the proximal phalanx. 2. Degenerative findings in the lateral carpus. Electronically Signed   By: Van Clines M.D.   On: 08/01/2018 12:16    ____________________________________________   PROCEDURES  Procedure(s) performed (including Critical Care):  Procedures   ____________________________________________   INITIAL IMPRESSION / ASSESSMENT AND PLAN / ED COURSE  As part of my medical decision making, I reviewed the following data within the  electronic MEDICAL RECORD NUMBER Notes from prior ED visits and [place] Controlled Substance Database  Patient presents to the ED with complaint of left upper extremity pain and some scalp discomfort after a fall that she had 5 days ago.  Patient states that she lives alone but that her young granddaughter was present at the time.  She denies any loss of consciousness and has had no photophobia, nausea or vomiting.  She continues to have left upper extremity pain.  X-ray did show a nondisplaced fracture of the proximal left fifth digit.  This was placed in a splint and patient was made aware to ice and elevate as needed for swelling and to help with pain.  All other x-rays including her CT scan of her head was negative.  Patient is to follow-up with her PCP if any continued problems and to consider make an appointment with Dr. Roland Rack who is the orthopedist on call if she continues to have problems with her left fifth finger. ____________________________________________   FINAL CLINICAL IMPRESSION(S) / ED DIAGNOSES  Final diagnoses:  Closed nondisplaced fracture of proximal phalanx of left little finger, initial encounter  Multiple contusions  Contusion of scalp, initial encounter  Fall in home, initial encounter     ED Discharge Orders    None       Note:  This document was prepared using Dragon voice recognition software and may include unintentional dictation errors.    Johnn Hai, PA-C 08/01/18 1308    Lavonia Drafts, MD 08/01/18 1308

## 2018-08-01 NOTE — Discharge Instructions (Signed)
Follow-up with your primary care provider if any continued problems.  Also follow-up with Dr. Roland Rack who is the orthopedist on call if any continued problems with your left fifth finger.  Wear the finger splint for protection and support.  You will need to wear this for approximately 4 to 6 weeks.  Ice and elevation as needed for swelling and for pain.  Continue taking your medication as you have done.  Also you will be sore and stiff for 5 to 7 days.  The remainder of your x-rays were negative for any acute bony changes and your CT scan was negative.

## 2018-08-01 NOTE — ED Notes (Signed)
See triage note  Presents s/p fall  States she fell on Monday in the house   Having pain with swelling to left hand and 5 th finger  Bruising noted to left elbow and shoulder  Also hit her head on table  Hematoma noted  Unsure of LOC or what happened   Good pulses noted

## 2018-08-01 NOTE — ED Triage Notes (Signed)
Pt presents via POV c/o left hand and headache since falling on Monday. Doesn't remember losing consciousness.

## 2018-09-04 IMAGING — MR MR SHOULDER*L* W/O CM
6 series · 40 of 40 positions shown · non-contrast
Comparison: None.

CLINICAL DATA: Left shoulder pain and weakness for 6 years. Patient
reports history of left shoulder fracture 2 years ago.

EXAM:
MRI OF THE LEFT SHOULDER WITHOUT CONTRAST
TECHNIQUE: Multiplanar, multisequence MR imaging of the shoulder was performed.
No intravenous contrast was administered.

[Series 3: T2 fat-sat · axial · 4.0mm · 0.47mm/px · z∈[-40,+66]mm · 6 of 25 slices shown (1 of 3)]
[im 1/25]
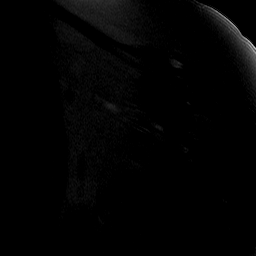
[im 5/25]
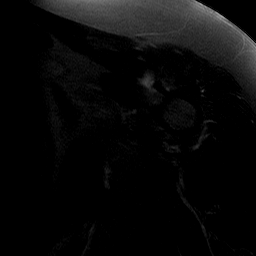
[im 10/25]
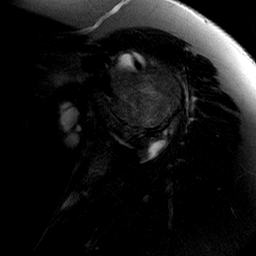
[im 15/25]
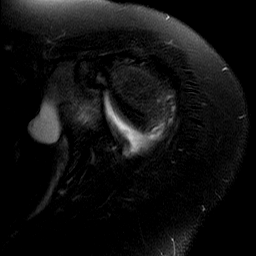
[im 20/25]
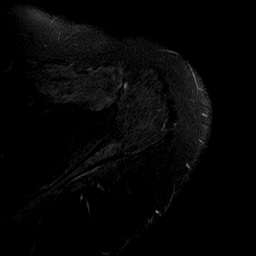
[im 25/25]
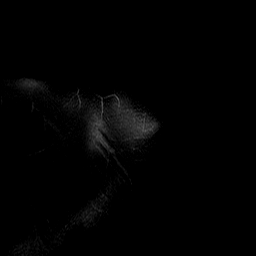

[Series 4: T2 fat-sat · oblique · 4.0mm · 0.62mm/px · 6 of 23 slices shown (2 of 3)]
[im 1/23]
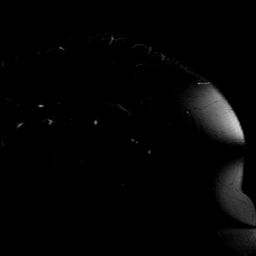
[im 5/23]
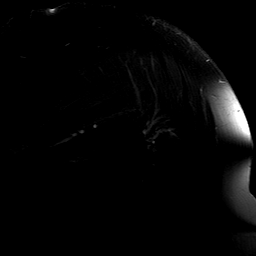
[im 9/23]
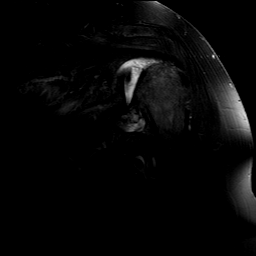
[im 14/23]
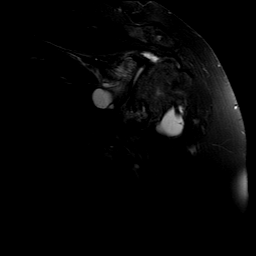
[im 18/23]
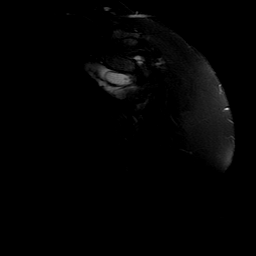
[im 23/23]
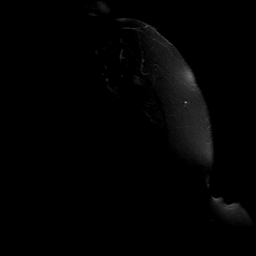

[Series 5: PD · oblique · 4.0mm · 0.62mm/px · 7 of 23 slices shown]
[im 1/23]
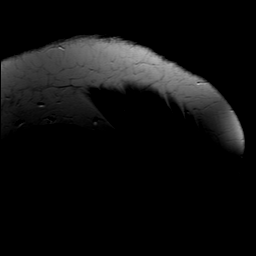
[im 4/23]
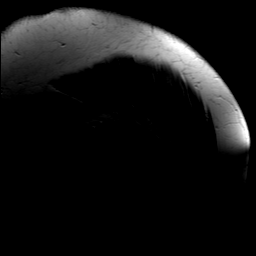
[im 8/23]
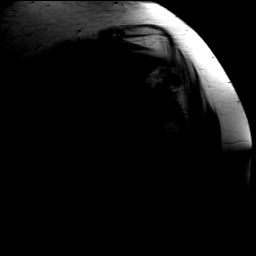
[im 12/23]
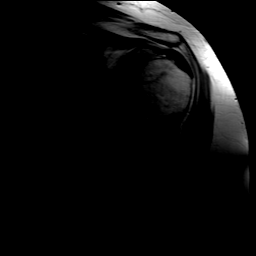
[im 15/23]
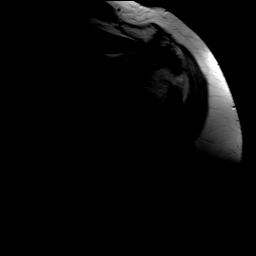
[im 19/23]
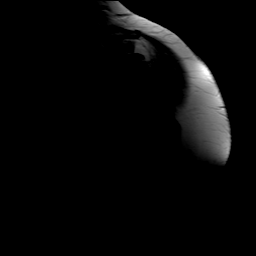
[im 23/23]
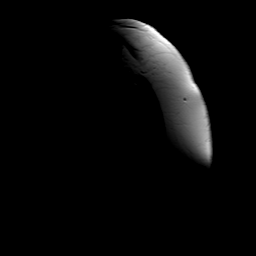

[Series 6: T1 · oblique · 4.0mm · 0.62mm/px · 7 of 23 slices shown]
[im 1/23]
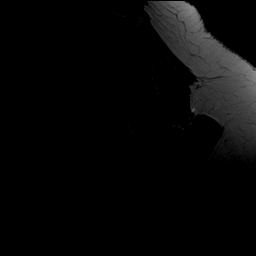
[im 4/23]
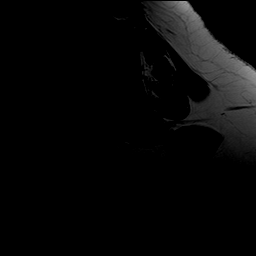
[im 8/23]
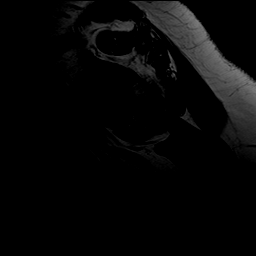
[im 12/23]
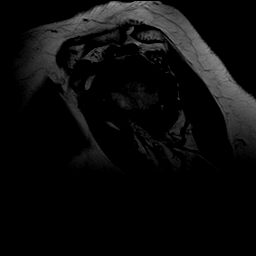
[im 15/23]
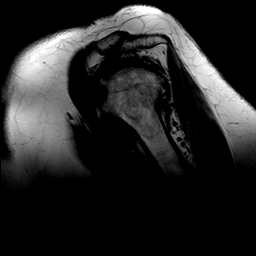
[im 19/23]
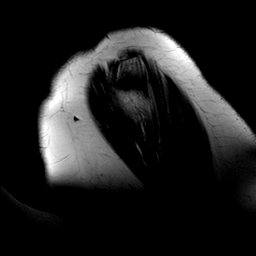
[im 23/23]
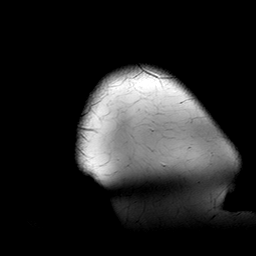

[Series 7: T2 fat-sat · oblique · 4.0mm · 0.62mm/px · 7 of 23 slices shown (3 of 3)]
[im 1/23]
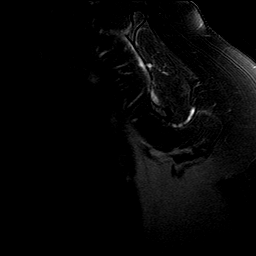
[im 4/23]
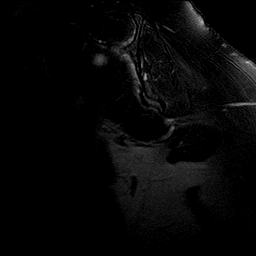
[im 8/23]
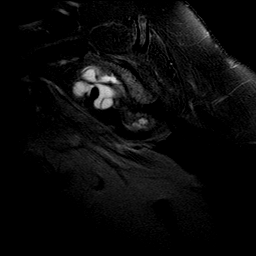
[im 12/23]
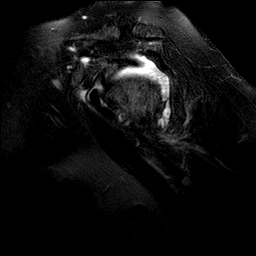
[im 15/23]
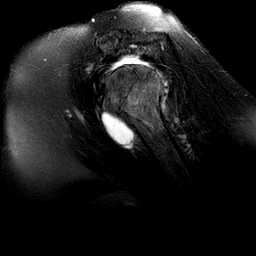
[im 19/23]
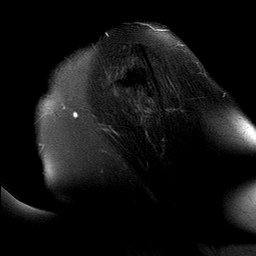
[im 23/23]
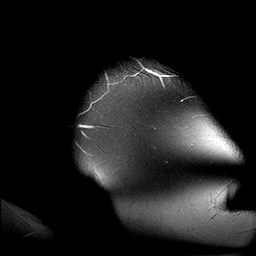

[Series 8: STIR · oblique · 4.0mm · 0.62mm/px · 7 of 23 slices shown]
[im 1/23]
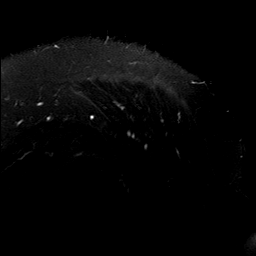
[im 4/23]
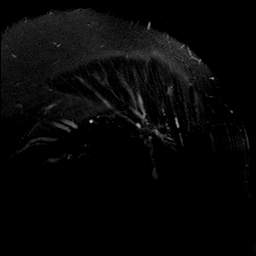
[im 8/23]
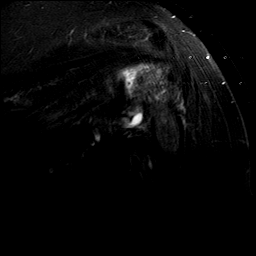
[im 12/23]
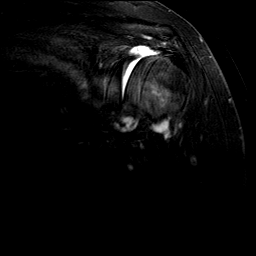
[im 15/23]
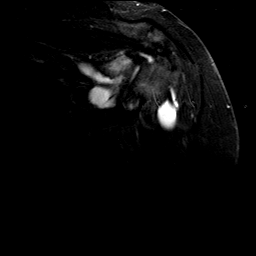
[im 19/23]
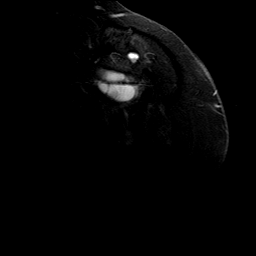
[im 23/23]
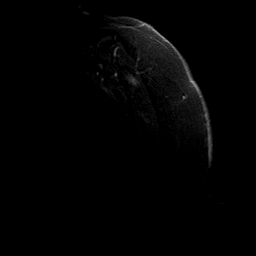

[40 of 40 positions shown; findings below may reference images not displayed]

FINDINGS: Rotator cuff: Intact. Mild supraspinatus and infraspinatus
tendinopathy noted.

Muscles:  No atrophy or focal lesion.

Biceps long head:  Intact.

Acromioclavicular Joint: Mild to moderate degenerative change is
seen. Type 1 acromion. No subacromial/subdeltoid bursal fluid.

Glenohumeral Joint: There is severe degenerative change with
complete denuding of hyaline cartilage. Both the humeral head and
glenoid are remodeled. Mild anterior subluxation of the humeral head
is noted. Large osteophyte off the inferior aspect of the humeral
head is also noted.

Labrum:  Diffusely degenerated.

Bones:  No fracture or worrisome lesion.

Other: None.
IMPRESSION: Dominant finding is severe glenohumeral osteoarthritis.

Intact rotator cuff with mild supraspinatus and infraspinatus
tendinopathy noted.

Mild acromioclavicular degenerative change.

## 2018-09-09 ENCOUNTER — Other Ambulatory Visit: Payer: Medicare Other | Admitting: Orthotics

## 2018-09-29 ENCOUNTER — Ambulatory Visit: Payer: Medicare Other | Admitting: Podiatry

## 2018-11-18 ENCOUNTER — Encounter: Payer: Self-pay | Admitting: Gastroenterology

## 2018-12-09 ENCOUNTER — Other Ambulatory Visit: Payer: Self-pay

## 2018-12-09 DIAGNOSIS — D509 Iron deficiency anemia, unspecified: Secondary | ICD-10-CM

## 2018-12-09 DIAGNOSIS — R933 Abnormal findings on diagnostic imaging of other parts of digestive tract: Secondary | ICD-10-CM

## 2018-12-17 ENCOUNTER — Telehealth: Payer: Self-pay

## 2018-12-17 NOTE — Telephone Encounter (Signed)
Pt called to cancel endoscopy procedure due to developing cold/cough symptoms. Pt states she'll contact our office to reschedule.

## 2018-12-18 ENCOUNTER — Other Ambulatory Visit: Admission: RE | Admit: 2018-12-18 | Payer: Medicare Other | Source: Ambulatory Visit

## 2018-12-23 ENCOUNTER — Ambulatory Visit: Admit: 2018-12-23 | Payer: Medicare Other | Admitting: Gastroenterology

## 2018-12-23 SURGERY — ENTEROSCOPY
Anesthesia: General

## 2019-01-20 ENCOUNTER — Other Ambulatory Visit: Payer: Self-pay

## 2019-01-20 ENCOUNTER — Ambulatory Visit: Payer: Medicare Other | Admitting: Orthotics

## 2019-01-20 DIAGNOSIS — M2041 Other hammer toe(s) (acquired), right foot: Secondary | ICD-10-CM

## 2019-01-20 DIAGNOSIS — B351 Tinea unguium: Secondary | ICD-10-CM

## 2019-01-20 DIAGNOSIS — L989 Disorder of the skin and subcutaneous tissue, unspecified: Secondary | ICD-10-CM

## 2019-01-20 NOTE — Progress Notes (Signed)
Exchanged for an an [place]

## 2019-01-22 ENCOUNTER — Encounter: Payer: Self-pay | Admitting: Podiatry

## 2019-01-22 ENCOUNTER — Other Ambulatory Visit: Payer: Self-pay

## 2019-01-22 ENCOUNTER — Ambulatory Visit (INDEPENDENT_AMBULATORY_CARE_PROVIDER_SITE_OTHER): Payer: Medicare Other | Admitting: Podiatry

## 2019-01-22 DIAGNOSIS — B351 Tinea unguium: Secondary | ICD-10-CM

## 2019-01-22 DIAGNOSIS — M79676 Pain in unspecified toe(s): Secondary | ICD-10-CM

## 2019-01-22 DIAGNOSIS — L989 Disorder of the skin and subcutaneous tissue, unspecified: Secondary | ICD-10-CM

## 2019-01-24 NOTE — Progress Notes (Signed)
    Subjective: Patient is a 68 y.o. female presenting to the office today for follow up evaluation of painful callus lesions to the bilateral third toes. Walking and bearing weight increases the pain. She has not had any recent treatment.   Patient also complains of elongated, thickened nails that cause pain while ambulating in shoes. She is unable to trim her own nails. Patient presents today for further treatment and evaluation.  Past Medical History:  Diagnosis Date  . Anxiety   . Arthritis   . Barrett's esophagus   . Bipolar disorder ([place])   . 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 Blood clot in vein   . Cancer ([place])    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  . Carotid arterial disease ([place])   . Contracture of joint of upper arm   . Depression   . Elevated lipids   . GERD (gastroesophageal reflux disease)   . Hypertension   . Migraine   . Osteoporosis   . Poor balance   . Sinus congestion   . Stroke ([place])    x 2  . TIA (transient ischemic attack)   . Vertigo     Objective:  Physical Exam General: Alert and oriented x3 in no acute distress  Dermatology: Hyperkeratotic lesion present on the bilateral third toes. Pain on palpation with a central nucleated core noted. Skin is warm, dry and supple bilateral lower extremities. Negative for open lesions or macerations. Nails are tender, long, thickened and dystrophic with subungual debris, consistent with onychomycosis, 1-5 bilateral. No signs of infection noted.  Vascular: Palpable pedal pulses bilaterally. No edema or erythema noted. Capillary refill within normal limits.  Neurological: Epicritic and protective threshold grossly intact bilaterally.   Musculoskeletal Exam: Pain on palpation at the keratotic lesion noted. Range of motion within normal limits bilateral. Muscle strength 5/5 in all groups bilateral.  Assessment: 1. Onychodystrophic nails 1-5 bilateral with hyperkeratosis of nails.  2. Onychomycosis of nail due to dermatophyte bilateral  3. Pre-ulcerative callus lesions noted to the bilateral 3rd toes   Plan of Care:  1. Patient evaluated. 2. Excisional debridement of keratoic lesion using a chisel blade was performed without incident.  3. Dressed with light dressing. 4. Mechanical debridement of nails 1-5 bilaterally performed using a nail nipper. Filed with dremel without incident.  5. Patient is to return to the clinic in 3 months.   Service dog's name is Dominica.   Edrick Kins, DPM [facility]  Dr. Edrick Kins, [address]                                        [place], [place] [address]                Office [phone]  Fax [phone]

## 2019-03-02 ENCOUNTER — Other Ambulatory Visit (HOSPITAL_COMMUNITY): Payer: Self-pay | Admitting: Surgery

## 2019-03-02 ENCOUNTER — Other Ambulatory Visit: Payer: Self-pay | Admitting: Surgery

## 2019-03-02 DIAGNOSIS — M19011 Primary osteoarthritis, right shoulder: Secondary | ICD-10-CM

## 2019-03-02 DIAGNOSIS — M7581 Other shoulder lesions, right shoulder: Secondary | ICD-10-CM

## 2019-03-15 ENCOUNTER — Ambulatory Visit
Admission: RE | Admit: 2019-03-15 | Discharge: 2019-03-15 | Disposition: A | Discharge status: Home / Self Care | Payer: Medicare Other | Source: Ambulatory Visit | Attending: Surgery | Admitting: Surgery

## 2019-03-15 ENCOUNTER — Other Ambulatory Visit: Payer: Self-pay

## 2019-03-15 DIAGNOSIS — M19011 Primary osteoarthritis, right shoulder: Secondary | ICD-10-CM | POA: Diagnosis present

## 2019-03-15 DIAGNOSIS — M7581 Other shoulder lesions, right shoulder: Secondary | ICD-10-CM

## 2019-03-24 ENCOUNTER — Ambulatory Visit: Payer: Medicare Other | Admitting: Orthotics

## 2019-03-24 ENCOUNTER — Telehealth: Payer: Self-pay | Admitting: Podiatry

## 2019-03-24 ENCOUNTER — Other Ambulatory Visit: Payer: Self-pay

## 2019-03-24 DIAGNOSIS — M2041 Other hammer toe(s) (acquired), right foot: Secondary | ICD-10-CM

## 2019-03-24 DIAGNOSIS — L989 Disorder of the skin and subcutaneous tissue, unspecified: Secondary | ICD-10-CM

## 2019-03-24 NOTE — Telephone Encounter (Signed)
Pt returned call from yesterday and stated she is very annoyed. Her appt to puo was scheduled in the [place] office by accident and I called her yesterday to let her know and possibly change the appt to that office. She said she did not schedule her appt in [place] and she has never been to the office in [place].She said she did not get my message until today that her phone must be messing up.[name]

## 2019-03-24 NOTE — Progress Notes (Signed)
Had to reorder a shoe 1/2 size larger; width is now right, but comes to end of toes with brace.

## 2019-04-07 ENCOUNTER — Other Ambulatory Visit: Payer: Self-pay | Admitting: Surgery

## 2019-04-16 ENCOUNTER — Ambulatory Visit: Payer: Medicare Other | Admitting: Orthotics

## 2019-04-16 ENCOUNTER — Other Ambulatory Visit: Payer: Self-pay

## 2019-04-16 DIAGNOSIS — M2042 Other hammer toe(s) (acquired), left foot: Secondary | ICD-10-CM

## 2019-04-16 DIAGNOSIS — L989 Disorder of the skin and subcutaneous tissue, unspecified: Secondary | ICD-10-CM

## 2019-04-16 DIAGNOSIS — M2041 Other hammer toe(s) (acquired), right foot: Secondary | ICD-10-CM

## 2019-04-23 ENCOUNTER — Other Ambulatory Visit: Payer: Self-pay | Admitting: Podiatry

## 2019-04-23 ENCOUNTER — Ambulatory Visit (INDEPENDENT_AMBULATORY_CARE_PROVIDER_SITE_OTHER): Payer: Medicare Other | Admitting: Podiatry

## 2019-04-23 ENCOUNTER — Other Ambulatory Visit: Payer: Self-pay

## 2019-04-23 ENCOUNTER — Ambulatory Visit (INDEPENDENT_AMBULATORY_CARE_PROVIDER_SITE_OTHER): Payer: Medicare Other

## 2019-04-23 ENCOUNTER — Encounter: Payer: Self-pay | Admitting: Podiatry

## 2019-04-23 DIAGNOSIS — L989 Disorder of the skin and subcutaneous tissue, unspecified: Secondary | ICD-10-CM

## 2019-04-23 DIAGNOSIS — M778 Other enthesopathies, not elsewhere classified: Secondary | ICD-10-CM

## 2019-04-23 DIAGNOSIS — M79671 Pain in right foot: Secondary | ICD-10-CM

## 2019-04-23 DIAGNOSIS — B351 Tinea unguium: Secondary | ICD-10-CM | POA: Diagnosis not present

## 2019-04-23 DIAGNOSIS — M79676 Pain in unspecified toe(s): Secondary | ICD-10-CM

## 2019-04-27 ENCOUNTER — Other Ambulatory Visit: Payer: Medicare Other

## 2019-04-27 NOTE — Progress Notes (Signed)
    Subjective: Patient is a 68 y.o. female presenting to the office today for follow up evaluation of painful callus lesions to the bilateral third toes. Walking and bearing weight increases the pain. She has not had any recent treatment.   Patient also complains of elongated, thickened nails that cause pain while ambulating in shoes. She is unable to trim her own nails.  She also reports pain to the dorsal right foot that began about one month ago. Touching the foot increases the pain. She has not had any treatment for the symptoms. Patient presents today for further treatment and evaluation.  Past Medical History:  Diagnosis Date  . Anxiety   . Arthritis   . Barrett's esophagus   . Bipolar disorder ([place])   . 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 Blood clot in vein   . Cancer ([place])    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  . Carotid arterial disease ([place])   . Contracture of joint of upper arm   . Depression   . Elevated lipids   . GERD (gastroesophageal reflux disease)   . Hypertension   . Migraine   . Osteoporosis   . Poor balance   . Sinus congestion   . 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 TIA (transient ischemic attack)   . Vertigo     Objective:  Physical Exam General: Alert and oriented x3 in no acute distress  Dermatology: Hyperkeratotic lesion present on the bilateral third toes. Pain on palpation with a central nucleated core noted. Skin is warm, dry and supple bilateral lower extremities. Negative for open lesions or macerations. Nails are tender, long, thickened and dystrophic with subungual debris, consistent with onychomycosis, 1-5 bilateral. No signs of infection noted.  Vascular: Palpable pedal pulses bilaterally. No edema or erythema noted. Capillary refill within normal limits.  Neurological: Epicritic and protective threshold grossly intact bilaterally.   Musculoskeletal Exam: Pain on palpation at the keratotic lesion noted as well as to the right midfoot. Range of motion within normal limits  bilateral. Muscle strength 5/5 in all groups bilateral.  Radiographic Exam: Degenerative changes with joint space narrowing noted throughout the right midfoot.   Assessment: 1. Onychodystrophic nails 1-5 bilateral with hyperkeratosis of nails.  2. Onychomycosis of nail due to dermatophyte bilateral 3. Pre-ulcerative callus lesions noted to the bilateral 3rd toes 4. Right midfoot capsulitis    Plan of Care:  1. Patient evaluated. 2. Excisional debridement of keratoic lesion using a chisel blade was performed without incident.  3. Dressed with light dressing. 4. Mechanical debridement of nails 1-5 bilaterally performed using a nail nipper. Filed with dremel without incident.  5. Injection of 0.5 mLs Celestone Soluspan injected into the right midfoot.  6. Patient is to return to the clinic in 3 months.   Service dog's name is Dominica.   Edrick Kins, DPM [facility]  Dr. Edrick Kins, [address]                                        [place], [place] [address]                Office [phone]  Fax [phone]

## 2019-04-29 ENCOUNTER — Other Ambulatory Visit: Payer: Medicare Other

## 2019-05-04 ENCOUNTER — Inpatient Hospital Stay: Admit: 2019-05-04 | Payer: Medicare Other | Admitting: Surgery

## 2019-05-04 SURGERY — ARTHROPLASTY, SHOULDER, TOTAL, REVERSE
Anesthesia: Choice | Site: Shoulder | Laterality: Right

## 2019-07-01 ENCOUNTER — Emergency Department: Payer: Medicare Other

## 2019-07-01 ENCOUNTER — Encounter: Payer: Self-pay | Admitting: Emergency Medicine

## 2019-07-01 ENCOUNTER — Other Ambulatory Visit: Payer: Self-pay

## 2019-07-01 ENCOUNTER — Emergency Department
Admission: EM | Admit: 2019-07-01 | Discharge: 2019-07-01 | Disposition: A | Discharge status: Home / Self Care | Payer: Medicare Other | Attending: Student | Admitting: Student

## 2019-07-01 DIAGNOSIS — Y929 Unspecified place or not applicable: Secondary | ICD-10-CM | POA: Insufficient documentation

## 2019-07-01 DIAGNOSIS — F17210 Nicotine dependence, cigarettes, uncomplicated: Secondary | ICD-10-CM | POA: Insufficient documentation

## 2019-07-01 DIAGNOSIS — Z7982 Long term (current) use of aspirin: Secondary | ICD-10-CM | POA: Diagnosis not present

## 2019-07-01 DIAGNOSIS — R1031 Right lower quadrant pain: Secondary | ICD-10-CM | POA: Diagnosis not present

## 2019-07-01 DIAGNOSIS — Y999 Unspecified external cause status: Secondary | ICD-10-CM | POA: Diagnosis not present

## 2019-07-01 DIAGNOSIS — Z8673 Personal history of transient ischemic attack (TIA), and cerebral infarction without residual deficits: Secondary | ICD-10-CM | POA: Insufficient documentation

## 2019-07-01 DIAGNOSIS — Y939 Activity, unspecified: Secondary | ICD-10-CM | POA: Diagnosis not present

## 2019-07-01 DIAGNOSIS — Z79899 Other long term (current) drug therapy: Secondary | ICD-10-CM | POA: Insufficient documentation

## 2019-07-01 DIAGNOSIS — W0110XA Fall on same level from slipping, tripping and stumbling with subsequent striking against unspecified object, initial encounter: Secondary | ICD-10-CM | POA: Insufficient documentation

## 2019-07-01 DIAGNOSIS — R079 Chest pain, unspecified: Secondary | ICD-10-CM | POA: Diagnosis present

## 2019-07-01 DIAGNOSIS — I10 Essential (primary) hypertension: Secondary | ICD-10-CM | POA: Insufficient documentation

## 2019-07-01 DIAGNOSIS — W19XXXA Unspecified fall, initial encounter: Secondary | ICD-10-CM

## 2019-07-01 DIAGNOSIS — S301XXA Contusion of abdominal wall, initial encounter: Secondary | ICD-10-CM

## 2019-07-01 LAB — TROPONIN I (HIGH SENSITIVITY)
Troponin I (High Sensitivity): 15 ng/L (ref <18)
Troponin I (High Sensitivity): 14 ng/L (ref <18)

## 2019-07-01 LAB — BASIC METABOLIC PANEL
Anion gap: 7 (ref 5–15)
BUN: 14 mg/dL (ref 8–23)
CO2: 21 mmol/L — ABNORMAL LOW (ref 22–32)
Calcium: 8.9 mg/dL (ref 8.9–10.3)
Chloride: 102 mmol/L (ref 98–111)
Creatinine, Ser: 0.99 mg/dL (ref 0.44–1.00)
GFR calc Af Amer: >60 mL/min (ref >60)
GFR calc non Af Amer: 59 mL/min — ABNORMAL LOW (ref >60)
Glucose, Bld: 105 mg/dL — ABNORMAL HIGH (ref 70–99)
Potassium: 3.5 mmol/L (ref 3.5–5.1)
Sodium: 130 mmol/L — ABNORMAL LOW (ref 135–145)

## 2019-07-01 LAB — URINALYSIS, COMPLETE (UACMP) WITH MICROSCOPIC
Bacteria, UA: NONE SEEN
Bilirubin Urine: NEGATIVE
Glucose, UA: NEGATIVE mg/dL
Hgb urine dipstick: NEGATIVE
Ketones, ur: NEGATIVE mg/dL
Leukocytes,Ua: NEGATIVE
Nitrite: NEGATIVE
Protein, ur: NEGATIVE mg/dL
Specific Gravity, Urine: 1.027 (ref 1.005–1.030)
pH: 7 (ref 5.0–8.0)

## 2019-07-01 LAB — CBC
HCT: 32 % — ABNORMAL LOW (ref 36.0–46.0)
Hemoglobin: 9.9 g/dL — ABNORMAL LOW (ref 12.0–15.0)
MCH: 23.2 pg — ABNORMAL LOW (ref 26.0–34.0)
MCHC: 30.9 g/dL (ref 30.0–36.0)
MCV: 75.1 fL — ABNORMAL LOW (ref 80.0–100.0)
Platelets: 402 10*3/uL — ABNORMAL HIGH (ref 150–400)
RBC: 4.26 MIL/uL (ref 3.87–5.11)
RDW: 16.6 % — ABNORMAL HIGH (ref 11.5–15.5)
WBC: 7.5 10*3/uL (ref 4.0–10.5)
nRBC: 0 % (ref 0.0–0.2)

## 2019-07-01 LAB — HEPATIC FUNCTION PANEL
ALT: 16 U/L (ref 0–44)
AST: 23 U/L (ref 15–41)
Albumin: 3.9 g/dL (ref 3.5–5.0)
Alkaline Phosphatase: 80 U/L (ref 38–126)
Bilirubin, Direct: <0.1 mg/dL (ref 0.0–0.2)
Total Bilirubin: 0.5 mg/dL (ref 0.3–1.2)
Total Protein: 6.9 g/dL (ref 6.5–8.1)

## 2019-07-01 LAB — LIPASE, BLOOD: Lipase: 32 U/L (ref 11–51)

## 2019-07-01 MED ORDER — SODIUM CHLORIDE 0.9 % IV BOLUS
500.0000 mL | Freq: Once | INTRAVENOUS | Status: AC
  Administered 2019-07-01: 500 mL via INTRAVENOUS

## 2019-07-01 MED ORDER — IOHEXOL 350 MG/ML SOLN
75.0000 mL | Freq: Once | INTRAVENOUS | Status: AC | PRN
  Administered 2019-07-01: 18:00:00 75 mL via INTRAVENOUS

## 2019-07-01 NOTE — Discharge Instructions (Addendum)
Thank you for letting us take care of you in the emergency department today.  Please continue to take over-the-counter ibuprofen and Tylenol as directed to help with your pain.  Please follow-up with your regular doctor to review your ER visit and follow-up on your symptoms.  Please follow-up with them regarding your CT scan incidental findings, as we discussed.  A copy of those findings as below.  Return to the ER for any new or worsening symptoms.  CT: -There is a 1.5 cm ground-glass airspace opacity in the right middle lobe. Initial follow-up with CT at 6-12 months is recommended to confirm persistence. If persistent, repeat CT is recommended every 2 years until 5 years of stability has been established.  -There is a 5 mm pulmonary nodule in the right upper lobe. No follow-up needed if patient is low-risk. Non-contrast chest CT can be considered in 12 months if patient is high-risk.

## 2019-07-01 NOTE — ED Triage Notes (Signed)
Pt states low rt abd pain x 3 days then chest pain today. Not on blood thinner.

## 2019-07-01 NOTE — ED Provider Notes (Addendum)
[facility] Emergency Department Provider Note  ____________________________________________   First MD Initiated Contact with Patient 07/01/19 1706     (approximate)  I have reviewed the triage vital signs and the nursing notes.  History  Chief Complaint Chest Pain    HPI Sydney Evans is a 69 y.o. female with past medical history as below who presents to the ER for chest pain and abdominal pain.  Patient states over the last several days she has developed both chest pain and right lower quadrant abdominal pain. Patient states symptoms first started with right lower quadrant abdominal pain.  She describes it as a sharp, stabbing, ripping type pain.  This is worsened with coughing, movement, walking.  Over the last day she has also started to develop the same type of pain in her central and left-sided chest area.  Again, this pain is sharp, stabbing, ripping.  Worsened with palpation and movement as well.  Both moderate to severe.  She feels like the right lower quadrant pain grips at her chest pain and that they are connected.  No alleviating components identified.  She denies any associated shortness of breath, nausea, back pain, diaphoresis.  No vomiting or diarrhea.  No urinary symptoms.  She does states she got tripped up in her dog's leash and fell on Sunday, and then again tripped over the dog and fell a few days later, and wonders if this could be contributing to her symptoms.  She denies any preceding presyncopal symptoms prior to falling, specifically states they were mechanical and do to her tripping over her dog.  She has not tried anything for her symptoms at home.    Past Medical Hx Past Medical History:  Diagnosis Date  . Anxiety   . Arthritis   . Barrett's esophagus   . Bipolar disorder ([place])   . 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 Blood clot in vein   . Cancer ([place])    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  . Carotid arterial disease ([place])   . Contracture of joint  of upper arm   . Depression   . Elevated lipids   . GERD (gastroesophageal reflux disease)   . Hypertension   . Migraine   . Osteoporosis   . Poor balance   . Sinus congestion   . 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 TIA (transient ischemic attack)   . Vertigo     Problem List Patient Active Problem List   Diagnosis Date Noted  . Post-concussion headache 01/20/2018  . Iron deficiency anemia 10/05/2017  . Carotid stenosis 11/17/2016  . GERD (gastroesophageal reflux disease) 11/17/2016  . Essential hypertension 11/17/2016  . Hyperlipidemia 11/17/2016  . 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 Rotator cuff tendinitis, left 05/20/2016  . History of TIAs 07/17/2015  . 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 TIA (transient ischemic attack) 12/23/2014  . 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 Disorder of bone and articular cartilage 09/29/2014    Past Surgical Hx Past Surgical History:  Procedure Laterality Date  . ABDOMINAL HYSTERECTOMY    . BACK SURGERY    . BREAST BIOPSY Left   . CARPAL TUNNEL RELEASE Bilateral   . CATARACT EXTRACTION W/ INTRAOCULAR LENS  IMPLANT, BILATERAL Bilateral   . CHOLECYSTECTOMY    . COLONOSCOPY WITH PROPOFOL N/A 04/09/2018   Procedure: COLONOSCOPY WITH PROPOFOL;  Surgeon: Jonathon Bellows, MD;  Location: [facility] ENDOSCOPY;  Service: Gastroenterology;  Laterality:  N/A;  . crystal cyst removed on left foot    . ESOPHAGOGASTRODUODENOSCOPY (EGD) WITH PROPOFOL N/A 04/09/2018   Procedure: ESOPHAGOGASTRODUODENOSCOPY (EGD) WITH PROPOFOL;  Surgeon: Jonathon Bellows, MD;  Location: [facility] ENDOSCOPY;  Service: Gastroenterology;  Laterality: N/A;  . femoral fx    . FOOT SURGERY    . GIVENS CAPSULE STUDY N/A 06/16/2018   Procedure: GIVENS CAPSULE STUDY;  Surgeon: Jonathon Bellows, MD;  Location: [facility] ENDOSCOPY;  Service: Gastroenterology;  Laterality: N/A;  . KNEE SURGERY    . TONSILLECTOMY    . TOTAL KNEE ARTHROPLASTY Bilateral   .  TOTAL SHOULDER ARTHROPLASTY Left 08/01/2016   Procedure: TOTAL SHOULDER ARTHROPLASTY;  Surgeon: Corky Mull, MD;  Location: [facility] ORS;  Service: Orthopedics;  Laterality: Left;    Medications Prior to Admission medications   Medication Sig Start Date End Date Taking? Authorizing Provider  aspirin EC 325 MG tablet Take 1 tablet (325 mg total) by mouth daily. 12/23/14   Gladstone Lighter, MD  atorvastatin (LIPITOR) 40 MG tablet Take 1 tablet (40 mg total) by mouth daily at 6 PM. 12/23/14   Gladstone Lighter, MD  Carboxymethylcellulose Sodium (LUBRICANT EYE DROPS OP) Place 1 drop into both eyes daily.    [provider]  citalopram (CELEXA) 40 MG tablet Take 40 mg by mouth daily.    [provider]  diphenhydrAMINE (BENADRYL) 25 MG tablet Take 25 mg by mouth every 6 (six) hours as needed for itching.    [provider]  fluticasone (FLONASE) 50 MCG/ACT nasal spray Place 2 sprays into both nostrils daily as needed for allergies or rhinitis.    [provider]  hydrochlorothiazide (HYDRODIURIL) 25 MG tablet Take 25 mg by mouth daily. 01/16/18   [provider]  IBU 800 MG tablet  09/25/17   [provider]  lamoTRIgine (LAMICTAL) 200 MG tablet Take 200 mg by mouth 2 (two) times daily.    [provider]  latanoprost (XALATAN) 0.005 % ophthalmic solution Place 2 drops into both eyes at bedtime.     [provider]  lisinopril (PRINIVIL,ZESTRIL) 40 MG tablet Take 40 mg by mouth daily.    [provider]  metoCLOPramide (REGLAN) 5 MG tablet Take 1 tablet (5 mg total) by mouth 4 (four) times daily as needed for nausea. May also take for headache. 01/20/18   Melvenia Beam, MD  Multiple Vitamin (MULTIVITAMIN WITH MINERALS) TABS tablet Take 1 tablet by mouth daily. One-A-Day Multivitamin for Women 50+    [provider]  omeprazole (PRILOSEC) 40 MG capsule Take 40 mg by mouth daily.    [provider]     Allergies Hydroxyzine, Gabapentin, Ketorolac, Hydrocodone, Percocet [oxycodone-acetaminophen], and Toradol [ketorolac tromethamine]  Family Hx Family History  Problem Relation Age of Onset  . Other Mother        ?lupus   . Cancer Mother   . High Cholesterol Mother   . CAD Father        CABG  . High Cholesterol Father   . Arthritis Father   . Cancer Sister   . Heart murmur Sister   . Heart murmur Brother   . High blood pressure Other        "for everybody"  . High Cholesterol Other        "for everybody"    Social Hx Social History   Tobacco Use  . Smoking status: Current Every Day Smoker    Packs/day: 0.50    Types: Cigarettes  Last attempt to quit: 08/04/2016    Years since quitting: 2.9  . Smokeless tobacco: Never Used  Substance Use Topics  . Alcohol use: No  . Drug use: No     Review of Systems  Constitutional: Negative for fever, chills. Eyes: Negative for visual changes. ENT: Negative for sore throat. Cardiovascular: + for chest pain. Respiratory: Negative for shortness of breath. Gastrointestinal: Negative for nausea, vomiting. + abdominal pain Genitourinary: Negative for dysuria. Musculoskeletal: Negative for leg swelling. Skin: Negative for rash. Neurological: Negative for headaches.   Physical Exam  Vital Signs: ED Triage Vitals  Enc Vitals Group     BP 07/01/19 1534 137/67     Pulse Rate 07/01/19 1534 62     Resp 07/01/19 1534 20     Temp 07/01/19 1534 98.7 F (37.1 C)     Temp src --      SpO2 07/01/19 1534 95 %     Weight 07/01/19 1530 167 lb (75.8 kg)     Height 07/01/19 1530 5\' 2"  (1.575 m)     Head Circumference --      Peak Flow --      Pain Score 07/01/19 1530 10     Pain Loc --      Pain Edu? --      Excl. in [place]? --     Constitutional: Alert and oriented.  Head: Normocephalic. Atraumatic. Eyes: Conjunctivae clear. Sclera anicteric. Nose: No congestion. No rhinorrhea. Mouth/Throat: Wearing mask.  Neck: No stridor.    Cardiovascular: Normal rate, regular rhythm. Extremities well perfused. Chest: Chest wall stable, no deformities, no crepitance. Pain reproducible with palpation along the mid left costochondral junction and left anterior chest wall.  No step-offs or deformities appreciated. No flail chest. Respiratory: Normal respiratory effort.  Lungs CTAB. Gastrointestinal: Soft.  Non-distended.  Tender to palpation in the right lower quadrant.  No appreciable or palpable hernias. Musculoskeletal: No lower extremity edema. No deformities. Neurologic:  Normal speech and language. No gross focal neurologic deficits are appreciated.  Skin: Skin is warm, dry and intact. No rash noted. Psychiatric: Mood and affect are appropriate for situation.  EKG  Personally reviewed.   Rate: 64 Rhythm: sinus Axis: normal Intervals: WNL No acute ischemic changes No STEMI    Radiology  CXR: IMPRESSION:  1. No acute cardiopulmonary disease.   CT: IMPRESSION:  1. Right rectus sheath hematoma. No evidence for active arterial  extravasation.  2. No aortic dissection or aneurysm.  3. Scattered areas of embolized kyphoplasty cement are noted in the  segmental and subsegmental pulmonary arteries bilaterally. This is  likely a chronic finding.  4. There is a 1.5 cm ground-glass airspace opacity in the right  middle lobe. Initial follow-up with CT at 6-12 months is recommended  to confirm persistence. If persistent, repeat CT is recommended  every 2 years until 5 years of stability has been established. This  recommendation follows the consensus statement: Guidelines for  Management of Incidental Pulmonary Nodules Detected on CT Images:  From the Fleischner Society 2017; Radiology 2017; 284:228-243.  5. There is a 5 mm pulmonary nodule in the right upper lobe. No  follow-up needed if patient is low-risk. Non-contrast chest CT can  be considered in 12 months if patient is high-risk. This  recommendation follows  the consensus statement: Guidelines for  Management of Incidental Pulmonary Nodules Detected on CT Images:  From the Fleischner Society 2017; Radiology 2017; 284:228-243.  6. Mild fat stranding about the urinary bladder. Correlation  with  urinalysis is recommended to help exclude an underlying cystitis.  7. Rectosigmoid diverticulosis without evidence of acute  diverticulitis.  8. Aortic Atherosclerosis (ICD10-I70.0).    Procedures  Procedure(s) performed (including critical care):  Procedures   Initial Impression / Assessment and Plan / ED Course  69 y.o. female who presents to the ED for chest pain and abdominal pain, as above  With regards to her concomitant symptoms, consider MSK, muscle strain related to her recent fall versus dissection.  Separately, with regards to her chest pain consider ACS, costochondritis, MSK, pleurisy.  Regards to her abdominal pain consider MSK, hernia (though none palpated on exam but somewhat limited due to adipose tissue), atypical appendicitis.  Will evaluate with labs, EKG, imaging.  Troponin x 2 negative. CT as above, right sided rectus sheath hematoma may be etiology of her abdominal pain.  Not on any anticoagulation.  Hemoglobin stable compared to prior, patient reports a long history of anemia, which she is aware of.  Suspect MSK related with regards to her chest symptoms as well in setting of her fall. No fractures seen.   As such, patient stable for discharge with outpatient follow-up.  Did update her on the incidental lung findings of her CT as noted above, and the need for PCP follow-up and likely repeat imaging for this.  She voices understanding of this.  Advised over-the-counter pain control as needed, given return precautions.  Stable for discharge.   Final Clinical Impression(s) / ED Diagnosis  Final diagnoses:  Chest pain in adult  Right lower quadrant abdominal pain  Hematoma of rectus sheath, initial encounter  Fall, initial  encounter       Note:  This document was prepared using Dragon voice recognition software and may include unintentional dictation errors.     Lilia Pro., MD 07/01/19 2029

## 2019-07-08 NOTE — Progress Notes (Signed)
Patient picked up reorder shoes

## 2019-07-23 ENCOUNTER — Ambulatory Visit: Payer: Medicare Other | Admitting: Podiatry

## 2019-08-30 ENCOUNTER — Other Ambulatory Visit: Payer: Self-pay | Admitting: Surgery

## 2019-08-30 DIAGNOSIS — M19011 Primary osteoarthritis, right shoulder: Secondary | ICD-10-CM

## 2019-08-30 DIAGNOSIS — M7581 Other shoulder lesions, right shoulder: Secondary | ICD-10-CM

## 2019-09-09 ENCOUNTER — Other Ambulatory Visit: Payer: Self-pay

## 2019-09-09 ENCOUNTER — Ambulatory Visit
Admission: RE | Admit: 2019-09-09 | Discharge: 2019-09-09 | Disposition: A | Discharge status: Home / Self Care | Payer: Medicare Other | Source: Ambulatory Visit | Attending: Surgery | Admitting: Surgery

## 2019-09-09 DIAGNOSIS — M7581 Other shoulder lesions, right shoulder: Secondary | ICD-10-CM

## 2019-09-09 DIAGNOSIS — M19011 Primary osteoarthritis, right shoulder: Secondary | ICD-10-CM | POA: Diagnosis present

## 2019-09-15 ENCOUNTER — Other Ambulatory Visit: Payer: Self-pay | Admitting: Surgery

## 2019-09-21 ENCOUNTER — Other Ambulatory Visit: Payer: Self-pay

## 2019-09-21 ENCOUNTER — Encounter
Admission: RE | Admit: 2019-09-21 | Discharge: 2019-09-21 | Disposition: A | Discharge status: Home / Self Care | Payer: Medicare Other | Source: Ambulatory Visit | Attending: Surgery | Admitting: Surgery

## 2019-09-21 HISTORY — DX: Anemia, unspecified: D64.9

## 2019-09-21 NOTE — Patient Instructions (Addendum)
Your procedure is scheduled on: Thursday Sep 30, 2019 Report to Day Surgery. To find out your arrival time please call [phone] between 1PM - 3PM on Wednesday Sep 29, 2019.  Remember: Instructions that are not followed completely may result in serious medical risk,  up to and including death, or upon the discretion of your surgeon and anesthesiologist your  surgery may need to be rescheduled.     _X__ 1. Do not eat food after midnight the night before your procedure.                 No gum chewing or hard candies. You may drink clear liquids up to 2 hours                 before you are scheduled to arrive for your surgery- DO not drink clear                 liquids within 2 hours of the start of your surgery.                 Clear Liquids include:  water, apple juice without pulp, clear Gatorade, G2 or                  Gatorade Zero (avoid Red/Purple/Blue), Black Coffee or Tea (Do not add                 anything to coffee or tea).  __X__2.   Complete the carbohydrate drink provided to you, 2 hours before arrival.  __X__3.  On the morning of surgery brush your teeth with toothpaste and water, you                may rinse your mouth with mouthwash if you wish.  Do not swallow any toothpaste of mouthwash.     _X__ 4.  No Alcohol for 24 hours before or after surgery.   _X__ 5.  Do Not Smoke or use e-cigarettes For 24 Hours Prior to Your Surgery.                 Do not use any chewable tobacco products for at least 6 hours prior to                 Surgery.  _X__  6.  Do not use any recreational drugs (marijuana, cocaine, heroin, ecstacy, MDMA or other)                For at least one week prior to your surgery.  Combination of these drugs with anesthesia                May have life threatening results.  __x__  7.  Notify your doctor if there is any change in your medical condition      (cold, fever, infections).     Do not wear jewelry, make-up, hairpins, clips  or nail polish. Do not wear lotions, powders, or perfumes. You may wear deodorant. Do not shave 48 hours prior to surgery. Men may shave face and neck. Do not bring valuables to the hospital.    [facility] is not responsible for any belongings or valuables.  Contacts, dentures or bridgework may not be worn into surgery. Leave your suitcase in the car. After surgery it may be brought to your room. For patients admitted to the hospital, discharge time is determined by your treatment team.   Patients discharged the day of surgery will not be allowed  to drive home.   Make arrangements for someone to be with you for the first 24 hours of your Same Day Discharge.    Please read over the following fact sheets that you were given:   Total Joint Packet   __X__ Take these medicines the morning of surgery with A SIP OF WATER:    1. citalopram (CELEXA) 40 MG  2. lamoTRIgine (LAMICTAL) 200 MG   3. omeprazole (PRILOSEC) 40 MG  4. tiZANidine (ZANAFLEX) 2 MG   __x__ Use CHG Soap (or wipes) as directed  __x__ Use Benzoyl Peroxide Gel as instructed  __x__ Stop Anti-inflammatories such as Ibuprofen, Aleve, naproxen, aspirin and or BC powders.    __x__ Stop supplements until after surgery.    __x__ Do not start any herbal supplement before your surgery.

## 2019-09-24 ENCOUNTER — Encounter
Admission: RE | Admit: 2019-09-24 | Discharge: 2019-09-24 | Disposition: A | Discharge status: Home / Self Care | Payer: Medicare Other | Source: Ambulatory Visit | Attending: Surgery | Admitting: Surgery

## 2019-09-24 DIAGNOSIS — R001 Bradycardia, unspecified: Secondary | ICD-10-CM | POA: Diagnosis not present

## 2019-09-24 DIAGNOSIS — Z01818 Encounter for other preprocedural examination: Secondary | ICD-10-CM | POA: Insufficient documentation

## 2019-09-24 DIAGNOSIS — R9431 Abnormal electrocardiogram [ECG] [EKG]: Secondary | ICD-10-CM | POA: Diagnosis not present

## 2019-09-24 LAB — URINALYSIS, ROUTINE W REFLEX MICROSCOPIC
Bilirubin Urine: NEGATIVE
Glucose, UA: NEGATIVE mg/dL
Hgb urine dipstick: NEGATIVE
Ketones, ur: NEGATIVE mg/dL
Leukocytes,Ua: NEGATIVE
Nitrite: NEGATIVE
Protein, ur: NEGATIVE mg/dL
Specific Gravity, Urine: 1.009 (ref 1.005–1.030)
pH: 7 (ref 5.0–8.0)

## 2019-09-24 LAB — CBC WITH DIFFERENTIAL/PLATELET
Abs Immature Granulocytes: 0.03 10*3/uL (ref 0.00–0.07)
Basophils Absolute: 0.1 10*3/uL (ref 0.0–0.1)
Basophils Relative: 1 %
Eosinophils Absolute: 0.1 10*3/uL (ref 0.0–0.5)
Eosinophils Relative: 1 %
HCT: 30.8 % — ABNORMAL LOW (ref 36.0–46.0)
Hemoglobin: 9.6 g/dL — ABNORMAL LOW (ref 12.0–15.0)
Immature Granulocytes: 1 %
Lymphocytes Relative: 27 %
Lymphs Abs: 1.5 10*3/uL (ref 0.7–4.0)
MCH: 22.5 pg — ABNORMAL LOW (ref 26.0–34.0)
MCHC: 31.2 g/dL (ref 30.0–36.0)
MCV: 72.3 fL — ABNORMAL LOW (ref 80.0–100.0)
Monocytes Absolute: 0.3 10*3/uL (ref 0.1–1.0)
Monocytes Relative: 6 %
Neutro Abs: 3.5 10*3/uL (ref 1.7–7.7)
Neutrophils Relative %: 64 %
Platelets: 436 10*3/uL — ABNORMAL HIGH (ref 150–400)
RBC: 4.26 MIL/uL (ref 3.87–5.11)
RDW: 17.3 % — ABNORMAL HIGH (ref 11.5–15.5)
WBC: 5.4 10*3/uL (ref 4.0–10.5)
nRBC: 0 % (ref 0.0–0.2)

## 2019-09-24 LAB — COMPREHENSIVE METABOLIC PANEL
ALT: 12 U/L (ref 0–44)
AST: 17 U/L (ref 15–41)
Albumin: 3.8 g/dL (ref 3.5–5.0)
Alkaline Phosphatase: 69 U/L (ref 38–126)
Anion gap: 9 (ref 5–15)
BUN: 12 mg/dL (ref 8–23)
CO2: 22 mmol/L (ref 22–32)
Calcium: 9.1 mg/dL (ref 8.9–10.3)
Chloride: 96 mmol/L — ABNORMAL LOW (ref 98–111)
Creatinine, Ser: 0.78 mg/dL (ref 0.44–1.00)
GFR calc Af Amer: >60 mL/min (ref >60)
GFR calc non Af Amer: >60 mL/min (ref >60)
Glucose, Bld: 96 mg/dL (ref 70–99)
Potassium: 3.5 mmol/L (ref 3.5–5.1)
Sodium: 127 mmol/L — ABNORMAL LOW (ref 135–145)
Total Bilirubin: 0.6 mg/dL (ref 0.3–1.2)
Total Protein: 6.9 g/dL (ref 6.5–8.1)

## 2019-09-24 LAB — SURGICAL PCR SCREEN
MRSA, PCR: NEGATIVE
Staphylococcus aureus: NEGATIVE

## 2019-09-24 NOTE — Pre-Procedure Instructions (Addendum)
SECURE CHAT WITH DR Randa Lynn:  Pt having total shoulder arthroplasty with Poggi on 5-27. Pt with h/o htn and cva. Ekg shows sb @ 43. Pt is not on a beta blocker. Normal heart rate in the 50's-60's from what i can tell. Pt was asymptomatic here today while getting ekg. Are we ok to proceed?   Her sodium result just came back and is 127. Do we need to recheck the morning of surgery. I am thinking the cut off for anesthesia is 125?  Yes we should plan to recheck it morning of. The cutoff is 125.  Ok will do. Do we need to address the low hr?   If she's asymptomatic there probably isn't anything to address. Who is her PCP?  It does not look like she has a PCP   That's problematic. It's concerning that her sodium is so low. I wouldn't say anything is preventing her from having surgery but seems like she has several issues that need to be addressed.  What do I need to do? Get Dr Poggi's office to get her set up with a PCP before she can have the surgery? Or are you ok with proceeding even though she has some issues that need to be addressed?  I don't think she has to see a PCP prior to surgery.

## 2019-09-24 NOTE — Pre-Procedure Instructions (Signed)
SECURE CHAT WITH DR Randa Lynn:  Pt having total shoulder arthroplasty with Poggi on 5-27. Pt with h/o htn and cva. Ekg shows sb @ 43. Pt is not on a beta blocker. Normal heart rate in the 50's-60's from what i can tell. Pt was asymptomatic here today while getting ekg. Are we ok to proceed?

## 2019-09-25 LAB — TYPE AND SCREEN
ABO/RH(D): O POS
Antibody Screen: NEGATIVE

## 2019-09-28 ENCOUNTER — Other Ambulatory Visit
Admission: RE | Admit: 2019-09-28 | Discharge: 2019-09-28 | Disposition: A | Discharge status: Home / Self Care | Payer: Medicare Other | Source: Ambulatory Visit | Attending: Surgery | Admitting: Surgery

## 2019-09-28 ENCOUNTER — Other Ambulatory Visit: Payer: Self-pay

## 2019-09-28 LAB — SARS CORONAVIRUS 2 (TAT 6-24 HRS): SARS Coronavirus 2: NEGATIVE

## 2019-09-30 ENCOUNTER — Other Ambulatory Visit: Payer: Self-pay

## 2019-09-30 ENCOUNTER — Observation Stay: Payer: Medicare Other

## 2019-09-30 ENCOUNTER — Inpatient Hospital Stay: Payer: Medicare Other | Admitting: Anesthesiology

## 2019-09-30 ENCOUNTER — Encounter: Payer: Self-pay | Admitting: Surgery

## 2019-09-30 ENCOUNTER — Encounter
Admission: RE | Disposition: A | Discharge status: Home / Self Care | Payer: Self-pay | Source: Ambulatory Visit | Attending: Surgery

## 2019-09-30 ENCOUNTER — Inpatient Hospital Stay
Admission: RE | Admit: 2019-09-30 | Discharge: 2019-10-01 | DRG: 483 | Disposition: A | Discharge status: Home Health | Payer: Medicare Other | Source: Ambulatory Visit | Attending: Surgery | Admitting: Surgery

## 2019-09-30 ENCOUNTER — Ambulatory Visit: Payer: Self-pay

## 2019-09-30 DIAGNOSIS — Z833 Family history of diabetes mellitus: Secondary | ICD-10-CM | POA: Diagnosis not present

## 2019-09-30 DIAGNOSIS — F319 Bipolar disorder, unspecified: Secondary | ICD-10-CM | POA: Diagnosis present

## 2019-09-30 DIAGNOSIS — Z20822 Contact with and (suspected) exposure to covid-19: Secondary | ICD-10-CM | POA: Diagnosis present

## 2019-09-30 DIAGNOSIS — G43909 Migraine, unspecified, not intractable, without status migrainosus: Secondary | ICD-10-CM | POA: Diagnosis present

## 2019-09-30 DIAGNOSIS — Z96659 Presence of unspecified artificial knee joint: Secondary | ICD-10-CM | POA: Diagnosis present

## 2019-09-30 DIAGNOSIS — E538 Deficiency of other specified B group vitamins: Secondary | ICD-10-CM | POA: Diagnosis present

## 2019-09-30 DIAGNOSIS — M810 Age-related osteoporosis without current pathological fracture: Secondary | ICD-10-CM | POA: Diagnosis present

## 2019-09-30 DIAGNOSIS — Z8261 Family history of arthritis: Secondary | ICD-10-CM

## 2019-09-30 DIAGNOSIS — Z791 Long term (current) use of non-steroidal anti-inflammatories (NSAID): Secondary | ICD-10-CM | POA: Diagnosis not present

## 2019-09-30 DIAGNOSIS — D472 Monoclonal gammopathy: Secondary | ICD-10-CM | POA: Diagnosis present

## 2019-09-30 DIAGNOSIS — I10 Essential (primary) hypertension: Secondary | ICD-10-CM | POA: Diagnosis present

## 2019-09-30 DIAGNOSIS — K219 Gastro-esophageal reflux disease without esophagitis: Secondary | ICD-10-CM | POA: Diagnosis present

## 2019-09-30 DIAGNOSIS — Z809 Family history of malignant neoplasm, unspecified: Secondary | ICD-10-CM | POA: Diagnosis not present

## 2019-09-30 DIAGNOSIS — Z79899 Other long term (current) drug therapy: Secondary | ICD-10-CM | POA: Diagnosis not present

## 2019-09-30 DIAGNOSIS — M19011 Primary osteoarthritis, right shoulder: Principal | ICD-10-CM | POA: Diagnosis present

## 2019-09-30 DIAGNOSIS — E785 Hyperlipidemia, unspecified: Secondary | ICD-10-CM | POA: Diagnosis present

## 2019-09-30 DIAGNOSIS — D509 Iron deficiency anemia, unspecified: Secondary | ICD-10-CM | POA: Diagnosis present

## 2019-09-30 DIAGNOSIS — Z8673 Personal history of transient ischemic attack (TIA), and cerebral infarction without residual deficits: Secondary | ICD-10-CM

## 2019-09-30 DIAGNOSIS — Z96611 Presence of right artificial shoulder joint: Secondary | ICD-10-CM

## 2019-09-30 DIAGNOSIS — Z96612 Presence of left artificial shoulder joint: Secondary | ICD-10-CM | POA: Diagnosis present

## 2019-09-30 DIAGNOSIS — Z419 Encounter for procedure for purposes other than remedying health state, unspecified: Secondary | ICD-10-CM

## 2019-09-30 HISTORY — PX: TOTAL SHOULDER ARTHROPLASTY: SHX126

## 2019-09-30 HISTORY — DX: Presence of right artificial shoulder joint: Z96.611

## 2019-09-30 LAB — POCT I-STAT, CHEM 8
BUN: 8 mg/dL (ref 8–23)
Calcium, Ion: 1.2 mmol/L (ref 1.15–1.40)
Chloride: 102 mmol/L (ref 98–111)
Creatinine, Ser: 0.7 mg/dL (ref 0.44–1.00)
Glucose, Bld: 77 mg/dL (ref 70–99)
HCT: 32 % — ABNORMAL LOW (ref 36.0–46.0)
Hemoglobin: 10.9 g/dL — ABNORMAL LOW (ref 12.0–15.0)
Potassium: 3.7 mmol/L (ref 3.5–5.1)
Sodium: 136 mmol/L (ref 135–145)
TCO2: 20 mmol/L — ABNORMAL LOW (ref 22–32)

## 2019-09-30 SURGERY — ARTHROPLASTY, SHOULDER, TOTAL
Anesthesia: General | Site: Shoulder | Laterality: Right

## 2019-09-30 MED ORDER — ACETAMINOPHEN 500 MG PO TABS
ORAL_TABLET | ORAL | Status: AC
  Filled 2019-09-30: qty 2

## 2019-09-30 MED ORDER — FENTANYL CITRATE (PF) 100 MCG/2ML IJ SOLN: 50.0000 ug | Freq: Once | INTRAMUSCULAR | Status: AC

## 2019-09-30 MED ORDER — BUPIVACAINE HCL (PF) 0.5 % IJ SOLN
INTRAMUSCULAR | Status: AC
  Filled 2019-09-30: qty 10

## 2019-09-30 MED ORDER — CITALOPRAM HYDROBROMIDE 20 MG PO TABS
40.0000 mg | ORAL_TABLET | Freq: Every day | ORAL | Status: DC
  Administered 2019-09-30 – 2019-10-01 (×2): 40 mg via ORAL
  Filled 2019-09-30 (×2): qty 2

## 2019-09-30 MED ORDER — DIPHENHYDRAMINE HCL 12.5 MG/5ML PO ELIX
12.5000 mg | ORAL_SOLUTION | ORAL | Status: DC | PRN
  Filled 2019-09-30: qty 10

## 2019-09-30 MED ORDER — ASPIRIN EC 325 MG PO TBEC
325.0000 mg | DELAYED_RELEASE_TABLET | Freq: Every day | ORAL | 0 refills | Status: DC
Start: 2019-09-30 — End: 2019-11-11

## 2019-09-30 MED ORDER — LATANOPROST 0.005 % OP SOLN
1.0000 [drp] | Freq: Every day | OPHTHALMIC | Status: DC
  Administered 2019-09-30: 1 [drp] via OPHTHALMIC
  Filled 2019-09-30: qty 2.5

## 2019-09-30 MED ORDER — FLEET ENEMA 7-19 GM/118ML RE ENEM: 1.0000 | ENEMA | Freq: Once | RECTAL | Status: DC | PRN

## 2019-09-30 MED ORDER — ONDANSETRON HCL 4 MG/2ML IJ SOLN: 4.0000 mg | Freq: Once | INTRAMUSCULAR | Status: DC | PRN

## 2019-09-30 MED ORDER — LAMOTRIGINE 100 MG PO TABS
200.0000 mg | ORAL_TABLET | Freq: Every day | ORAL | Status: DC
  Administered 2019-09-30 – 2019-10-01 (×2): 200 mg via ORAL
  Filled 2019-09-30 (×2): qty 2

## 2019-09-30 MED ORDER — SODIUM CHLORIDE 0.9 % IV SOLN
INTRAVENOUS | Status: DC | PRN
  Administered 2019-09-30: 100 mL via TOPICAL

## 2019-09-30 MED ORDER — BUPIVACAINE LIPOSOME 1.3 % IJ SUSP
INTRAMUSCULAR | Status: AC
  Filled 2019-09-30: qty 20

## 2019-09-30 MED ORDER — OXYCODONE HCL 5 MG PO TABS: 5.0000 mg | ORAL_TABLET | ORAL | Status: DC | PRN

## 2019-09-30 MED ORDER — BUPIVACAINE-EPINEPHRINE (PF) 0.5% -1:200000 IJ SOLN
INTRAMUSCULAR | Status: AC
  Filled 2019-09-30: qty 30

## 2019-09-30 MED ORDER — TRAMADOL HCL 50 MG PO TABS
ORAL_TABLET | ORAL | Status: AC
  Filled 2019-09-30: qty 1

## 2019-09-30 MED ORDER — FENTANYL CITRATE (PF) 100 MCG/2ML IJ SOLN
INTRAMUSCULAR | Status: DC | PRN
  Administered 2019-09-30: 25 ug via INTRAVENOUS

## 2019-09-30 MED ORDER — OXYCODONE HCL 5 MG PO TABS: 5.0000 mg | ORAL_TABLET | ORAL | 0 refills | Status: DC | PRN

## 2019-09-30 MED ORDER — TRAMADOL HCL 50 MG PO TABS
50.0000 mg | ORAL_TABLET | Freq: Four times a day (QID) | ORAL | Status: DC
  Administered 2019-09-30: 50 mg via ORAL

## 2019-09-30 MED ORDER — PANTOPRAZOLE SODIUM 40 MG PO TBEC
40.0000 mg | DELAYED_RELEASE_TABLET | Freq: Every day | ORAL | Status: DC
  Administered 2019-09-30 – 2019-10-01 (×2): 40 mg via ORAL
  Filled 2019-09-30 (×2): qty 1

## 2019-09-30 MED ORDER — CEFAZOLIN SODIUM-DEXTROSE 2-4 GM/100ML-% IV SOLN
INTRAVENOUS | Status: AC
  Filled 2019-09-30: qty 100

## 2019-09-30 MED ORDER — ACETAMINOPHEN 325 MG PO TABS: 325.0000 mg | ORAL_TABLET | Freq: Four times a day (QID) | ORAL | Status: DC | PRN

## 2019-09-30 MED ORDER — HYDRALAZINE HCL 10 MG PO TABS
10.0000 mg | ORAL_TABLET | Freq: Once | ORAL | Status: AC
  Administered 2019-09-30: 10 mg via ORAL
  Filled 2019-09-30: qty 1

## 2019-09-30 MED ORDER — METOCLOPRAMIDE HCL 10 MG PO TABS: 5.0000 mg | ORAL_TABLET | Freq: Three times a day (TID) | ORAL | Status: DC | PRN

## 2019-09-30 MED ORDER — LIDOCAINE HCL (PF) 1 % IJ SOLN
INTRAMUSCULAR | Status: AC
  Filled 2019-09-30: qty 5

## 2019-09-30 MED ORDER — DOCUSATE SODIUM 100 MG PO CAPS
100.0000 mg | ORAL_CAPSULE | Freq: Two times a day (BID) | ORAL | Status: DC
  Administered 2019-09-30 – 2019-10-01 (×2): 100 mg via ORAL
  Filled 2019-09-30 (×3): qty 1

## 2019-09-30 MED ORDER — EPHEDRINE 5 MG/ML INJ
INTRAVENOUS | Status: AC
  Filled 2019-09-30: qty 10

## 2019-09-30 MED ORDER — PROPOFOL 10 MG/ML IV BOLUS
INTRAVENOUS | Status: DC | PRN
  Administered 2019-09-30: 30 mg via INTRAVENOUS
  Administered 2019-09-30: 120 mg via INTRAVENOUS

## 2019-09-30 MED ORDER — HYDROCHLOROTHIAZIDE 25 MG PO TABS
25.0000 mg | ORAL_TABLET | Freq: Every day | ORAL | Status: DC
  Administered 2019-10-01: 25 mg via ORAL
  Filled 2019-09-30 (×2): qty 1

## 2019-09-30 MED ORDER — FAMOTIDINE 20 MG PO TABS
ORAL_TABLET | ORAL | Status: AC
  Filled 2019-09-30: qty 1

## 2019-09-30 MED ORDER — PHENYLEPHRINE HCL-NACL 20-0.9 MG/250ML-% IV SOLN
INTRAVENOUS | Status: DC | PRN
  Administered 2019-09-30: 50 ug/min via INTRAVENOUS

## 2019-09-30 MED ORDER — CEFAZOLIN SODIUM-DEXTROSE 2-4 GM/100ML-% IV SOLN
2.0000 g | Freq: Four times a day (QID) | INTRAVENOUS | Status: AC
  Administered 2019-09-30: 2 g via INTRAVENOUS

## 2019-09-30 MED ORDER — FAMOTIDINE 20 MG PO TABS
20.0000 mg | ORAL_TABLET | Freq: Once | ORAL | Status: AC
  Administered 2019-09-30: 20 mg via ORAL

## 2019-09-30 MED ORDER — PHENYLEPHRINE HCL (PRESSORS) 10 MG/ML IV SOLN
INTRAVENOUS | Status: AC
  Filled 2019-09-30: qty 1

## 2019-09-30 MED ORDER — ONDANSETRON HCL 4 MG/2ML IJ SOLN: 4.0000 mg | Freq: Four times a day (QID) | INTRAMUSCULAR | Status: DC | PRN

## 2019-09-30 MED ORDER — LACTATED RINGERS IV SOLN: INTRAVENOUS | Status: DC

## 2019-09-30 MED ORDER — EPHEDRINE SULFATE 50 MG/ML IJ SOLN
INTRAMUSCULAR | Status: DC | PRN
  Administered 2019-09-30 (×2): 10 mg via INTRAVENOUS

## 2019-09-30 MED ORDER — TRAMADOL HCL 50 MG PO TABS: 50.0000 mg | ORAL_TABLET | Freq: Four times a day (QID) | ORAL | 0 refills | Status: DC

## 2019-09-30 MED ORDER — POLYVINYL ALCOHOL 1.4 % OP SOLN
Freq: Every day | OPHTHALMIC | Status: DC | PRN
  Filled 2019-09-30: qty 15

## 2019-09-30 MED ORDER — BUPIVACAINE LIPOSOME 1.3 % IJ SUSP
INTRAMUSCULAR | Status: DC | PRN
  Administered 2019-09-30: 15 mL via PERINEURAL

## 2019-09-30 MED ORDER — LIDOCAINE HCL (CARDIAC) PF 100 MG/5ML IV SOSY
PREFILLED_SYRINGE | INTRAVENOUS | Status: DC | PRN
  Administered 2019-09-30: 70 mg via INTRAVENOUS

## 2019-09-30 MED ORDER — CEFAZOLIN SODIUM-DEXTROSE 2-4 GM/100ML-% IV SOLN
INTRAVENOUS | Status: AC
  Administered 2019-09-30: 2 g via INTRAVENOUS
  Filled 2019-09-30: qty 100

## 2019-09-30 MED ORDER — BUPIVACAINE-EPINEPHRINE (PF) 0.5% -1:200000 IJ SOLN
INTRAMUSCULAR | Status: DC | PRN
  Administered 2019-09-30: 30 mL via PERINEURAL

## 2019-09-30 MED ORDER — GLYCOPYRROLATE 0.2 MG/ML IJ SOLN
INTRAMUSCULAR | Status: DC | PRN
  Administered 2019-09-30: .2 mg via INTRAVENOUS

## 2019-09-30 MED ORDER — TIZANIDINE HCL 2 MG PO TABS
2.0000 mg | ORAL_TABLET | Freq: Two times a day (BID) | ORAL | Status: DC | PRN
  Filled 2019-09-30: qty 1

## 2019-09-30 MED ORDER — LISINOPRIL 20 MG PO TABS
40.0000 mg | ORAL_TABLET | Freq: Every day | ORAL | Status: DC
  Administered 2019-10-01: 40 mg via ORAL
  Filled 2019-09-30 (×2): qty 2

## 2019-09-30 MED ORDER — FENTANYL CITRATE (PF) 100 MCG/2ML IJ SOLN
INTRAMUSCULAR | Status: AC
  Administered 2019-09-30: 50 ug via INTRAVENOUS
  Filled 2019-09-30: qty 2

## 2019-09-30 MED ORDER — SODIUM CHLORIDE FLUSH 0.9 % IV SOLN
INTRAVENOUS | Status: AC
  Filled 2019-09-30: qty 40

## 2019-09-30 MED ORDER — BUPIVACAINE HCL (PF) 0.5 % IJ SOLN
INTRAMUSCULAR | Status: DC | PRN
  Administered 2019-09-30: 5 mL via PERINEURAL

## 2019-09-30 MED ORDER — PROPOFOL 10 MG/ML IV BOLUS
INTRAVENOUS | Status: AC
  Filled 2019-09-30: qty 20

## 2019-09-30 MED ORDER — TRANEXAMIC ACID 1000 MG/10ML IV SOLN
INTRAVENOUS | Status: AC
  Filled 2019-09-30: qty 10

## 2019-09-30 MED ORDER — BISACODYL 10 MG RE SUPP
10.0000 mg | Freq: Every day | RECTAL | Status: DC | PRN
  Filled 2019-09-30: qty 1

## 2019-09-30 MED ORDER — MIDAZOLAM HCL 2 MG/2ML IJ SOLN
INTRAMUSCULAR | Status: AC
  Administered 2019-09-30: 1 mg via INTRAVENOUS
  Filled 2019-09-30: qty 2

## 2019-09-30 MED ORDER — ENOXAPARIN SODIUM 40 MG/0.4ML ~~LOC~~ SOLN: 40.0000 mg | SUBCUTANEOUS | Status: DC

## 2019-09-30 MED ORDER — MAGNESIUM HYDROXIDE 400 MG/5ML PO SUSP
30.0000 mL | Freq: Every day | ORAL | Status: DC | PRN
  Filled 2019-09-30: qty 30

## 2019-09-30 MED ORDER — ACETAMINOPHEN 500 MG PO TABS
1000.0000 mg | ORAL_TABLET | Freq: Four times a day (QID) | ORAL | Status: AC
  Administered 2019-09-30: 1000 mg via ORAL

## 2019-09-30 MED ORDER — FLUTICASONE PROPIONATE 50 MCG/ACT NA SUSP
2.0000 | Freq: Every day | NASAL | Status: DC | PRN
  Filled 2019-09-30: qty 16

## 2019-09-30 MED ORDER — MIDAZOLAM HCL 2 MG/2ML IJ SOLN: 1.0000 mg | Freq: Once | INTRAMUSCULAR | Status: AC

## 2019-09-30 MED ORDER — CHLORHEXIDINE GLUCONATE 0.12 % MT SOLN
OROMUCOSAL | Status: AC
  Administered 2019-09-30: 15 mL
  Filled 2019-09-30: qty 15

## 2019-09-30 MED ORDER — HYDROMORPHONE HCL 1 MG/ML IJ SOLN: 0.2500 mg | INTRAMUSCULAR | Status: DC | PRN

## 2019-09-30 MED ORDER — FENTANYL CITRATE (PF) 100 MCG/2ML IJ SOLN
INTRAMUSCULAR | Status: AC
  Filled 2019-09-30: qty 2

## 2019-09-30 MED ORDER — DIPHENHYDRAMINE HCL 25 MG PO TABS
25.0000 mg | ORAL_TABLET | Freq: Four times a day (QID) | ORAL | Status: DC | PRN
  Filled 2019-09-30: qty 1

## 2019-09-30 MED ORDER — CEFAZOLIN SODIUM-DEXTROSE 2-4 GM/100ML-% IV SOLN
2.0000 g | INTRAVENOUS | Status: AC
  Administered 2019-09-30: 2 g via INTRAVENOUS

## 2019-09-30 MED ORDER — TRANEXAMIC ACID 1000 MG/10ML IV SOLN
INTRAVENOUS | Status: DC | PRN
  Administered 2019-09-30: 1000 mg via INTRAVENOUS

## 2019-09-30 MED ORDER — SODIUM CHLORIDE 0.9 % IV SOLN: INTRAVENOUS | Status: DC

## 2019-09-30 MED ORDER — ROCURONIUM BROMIDE 100 MG/10ML IV SOLN
INTRAVENOUS | Status: DC | PRN
  Administered 2019-09-30: 50 mg via INTRAVENOUS

## 2019-09-30 MED ORDER — ONDANSETRON HCL 4 MG PO TABS: 4.0000 mg | ORAL_TABLET | Freq: Four times a day (QID) | ORAL | Status: DC | PRN

## 2019-09-30 MED ORDER — METOCLOPRAMIDE HCL 5 MG/ML IJ SOLN: 5.0000 mg | Freq: Three times a day (TID) | INTRAMUSCULAR | Status: DC | PRN

## 2019-09-30 SURGICAL SUPPLY — 68 items
BAG DECANTER FOR FLEXI CONT (MISCELLANEOUS) ×2 IMPLANT
BLADE SAGITTAL WIDE XTHICK NO (BLADE) ×2 IMPLANT
BOWL CEMENT MIX W/ADAPTER (MISCELLANEOUS) ×2 IMPLANT
CANISTER SUCT 1200ML W/VALVE (MISCELLANEOUS) ×2 IMPLANT
CANISTER SUCT 3000ML PPV (MISCELLANEOUS) ×4 IMPLANT
CEMENT BONE R 1X40 (Cement) ×2 IMPLANT
CEMENT VACUUM MIXING SYSTEM (MISCELLANEOUS) ×2 IMPLANT
CHLORAPREP W/TINT 26 (MISCELLANEOUS) ×2 IMPLANT
COOLER POLAR GLACIER W/PUMP (MISCELLANEOUS) ×2 IMPLANT
COVER BACK TABLE REUSABLE LG (DRAPES) ×2 IMPLANT
COVER WAND RF STERILE (DRAPES) ×2 IMPLANT
DRAPE 3/4 80X56 (DRAPES) ×4 IMPLANT
DRAPE INCISE IOBAN 66X45 STRL (DRAPES) ×4 IMPLANT
DRSG OPSITE POSTOP 4X8 (GAUZE/BANDAGES/DRESSINGS) ×2 IMPLANT
ELECT BLADE 6.5 EXT (BLADE) ×2 IMPLANT
ELECT CAUTERY BLADE 6.4 (BLADE) ×2 IMPLANT
GAUZE PACK 2X3YD (GAUZE/BANDAGES/DRESSINGS) ×2 IMPLANT
GLENOID PEGGED HYBRID SM 4MM (Orthopedic Implant) ×2 IMPLANT
GLENOID POST REGENREX HYBRID (Orthopedic Implant) ×2 IMPLANT
GLOVE BIO SURGEON STRL SZ7.5 (GLOVE) ×8 IMPLANT
GLOVE BIO SURGEON STRL SZ8 (GLOVE) ×8 IMPLANT
GLOVE BIOGEL PI IND STRL 8 (GLOVE) ×1 IMPLANT
GLOVE BIOGEL PI INDICATOR 8 (GLOVE) ×1
GLOVE INDICATOR 8.0 STRL GRN (GLOVE) ×2 IMPLANT
GOWN STRL REUS W/ TWL LRG LVL3 (GOWN DISPOSABLE) ×1 IMPLANT
GOWN STRL REUS W/ TWL XL LVL3 (GOWN DISPOSABLE) ×1 IMPLANT
GOWN STRL REUS W/TWL LRG LVL3 (GOWN DISPOSABLE) ×1
GOWN STRL REUS W/TWL XL LVL3 (GOWN DISPOSABLE) ×1
HEAD HUMERAL BIPOLAR 42X18X46 (Orthopedic Implant) ×1 IMPLANT
HEAD HUMERAL COMP STD (Orthopedic Implant) ×1 IMPLANT
HOOD PEEL AWAY FLYTE STAYCOOL (MISCELLANEOUS) ×6 IMPLANT
HUMERAL HEAD BIPOLAR 42X18X46 (Orthopedic Implant) ×2 IMPLANT
HUMERAL HEAD COMP STD (Orthopedic Implant) ×2 IMPLANT
ILLUMINATOR WAVEGUIDE N/F (MISCELLANEOUS) ×2 IMPLANT
IV NS 100ML SINGLE PACK (IV SOLUTION) ×2 IMPLANT
KIT STABILIZATION SHOULDER (MISCELLANEOUS) ×2 IMPLANT
MASK FACE SPIDER DISP (MASK) ×2 IMPLANT
MAT ABSORB  FLUID 56X50 GRAY (MISCELLANEOUS) ×1
MAT ABSORB FLUID 56X50 GRAY (MISCELLANEOUS) ×1 IMPLANT
NDL SAFETY ECLIPSE 18X1.5 (NEEDLE) ×1 IMPLANT
NEEDLE HYPO 18GX1.5 SHARP (NEEDLE) ×1
NEEDLE SPNL 20GX3.5 QUINCKE YW (NEEDLE) ×2 IMPLANT
NS IRRIG 500ML POUR BTL (IV SOLUTION) ×2 IMPLANT
PACK SHDR ARTHRO (MISCELLANEOUS) ×2 IMPLANT
PAD WRAPON POLAR SHDR UNIV (MISCELLANEOUS) ×1 IMPLANT
PENCIL SMOKE EVACUATOR (MISCELLANEOUS) ×2 IMPLANT
PIN THREADED REVERSE (PIN) ×2 IMPLANT
PULSAVAC PLUS IRRIG FAN TIP (DISPOSABLE) ×2
SLING ULTRA II M (MISCELLANEOUS) ×2 IMPLANT
SOL .9 NS 3000ML IRR  AL (IV SOLUTION) ×1
SOL .9 NS 3000ML IRR UROMATIC (IV SOLUTION) ×1 IMPLANT
SPONGE LAP 18X18 RF (DISPOSABLE) ×2 IMPLANT
STAPLER SKIN PROX 35W (STAPLE) ×2 IMPLANT
STEM HUMERAL STRL 10MMX55MM (Stem) ×2 IMPLANT
STRAP SAFETY 5IN WIDE (MISCELLANEOUS) ×2 IMPLANT
SUT ETHIBOND 0 MO6 C/R (SUTURE) ×2 IMPLANT
SUT FIBERWIRE #2 38 BLUE 1/2 (SUTURE) ×4
SUT VIC AB 0 CT1 36 (SUTURE) ×4 IMPLANT
SUT VIC AB 2-0 CT1 27 (SUTURE) ×2
SUT VIC AB 2-0 CT1 TAPERPNT 27 (SUTURE) ×2 IMPLANT
SUTURE FIBERWR #2 38 BLUE 1/2 (SUTURE) ×2 IMPLANT
SYR 10ML LL (SYRINGE) ×2 IMPLANT
SYR 30ML LL (SYRINGE) ×4 IMPLANT
SYRINGE IRR TOOMEY STRL 70CC (SYRINGE) ×2 IMPLANT
TAPE TRANSPORE STRL 2 31045 (GAUZE/BANDAGES/DRESSINGS) ×2 IMPLANT
TIP FAN IRRIG PULSAVAC PLUS (DISPOSABLE) ×1 IMPLANT
TRAY FOLEY MTR SLVR 16FR STAT (SET/KITS/TRAYS/PACK) ×2 IMPLANT
WRAPON POLAR PAD SHDR UNIV (MISCELLANEOUS) ×2

## 2019-09-30 NOTE — H&P (Signed)
History of Present Illness:  Sydney Evans is a 69 y.o. female that presents to clinic today for her preoperative history and evaluation. Patient presents unaccompanied. The patient is scheduled to undergo a right total shoulder arthroplasty on 09/30/19 by Dr. Roland Rack. The patient reports a long history of right shoulder pain. Patient denies numbness, tingling in the hand.  The patient's symptoms have progressed to the point that they decrease her quality of life. The patient has previously undergone conservative treatment including NSAIDS, activity modification, and intra-articular cortisone injection without adequate control of her symptoms.  Patient denies history of blood clots.  Past Medical, Surgical, Family, Social History, Allergies, Medications:   Past Medical History:  . Arthritis  . B12 deficiency  . Bilateral carpal tunnel syndrome  . Cancer (CMS-HCC)  cervix and uterus  . Concussion 2019  after fall  . Hypertension  . Iron deficiency anemia  . MGUS (monoclonal gammopathy of unknown significance)  . Migraine headache  . Plantar fasciitis  . Stroke (CMS-HCC)   Past Surgical History:  . BREAST EXCISIONAL BIOPSY  . CARPAL TUNNEL RELEASE  . CATARACT EXTRACTION  . CHOLECYSTECTOMY OPEN  . EXTRACTION CATARACT EXTRACAPSULAR W/INSERTION INTRAOCULAR PROSTHESIS Bilateral 2002  . femur fracture with hardware placement Right 2016  . foot surgery Bilateral  . Left total shoulder arthroplasty Left 08/01/2016  Dr.Poggi  . REPLACEMENT TOTAL KNEE  . TONSILLECTOMY  . TOTAL ABDOMINAL HYSTERECTOMY W/COLPO-URETHROCYSTOPEXY   Current Medications:  . travoprost (TRAVATAN Z) 0.004 % Ophth ophthalmic solution travoprost 0.004 % eye drops  . aspirin 325 MG tablet aspirin 325 mg tablet take 1 tablet by mouth once daily  . carboxymethylcellulose (REFRESH PLUS) 0.5 % ophthalmic solution Apply 1 drop to eye once daily as needed  . citalopram (CELEXA) 40 MG tablet Take 40 mg by mouth once  daily. 0  . diclofenac (VOLTAREN) 1 % topical gel Apply 2 g topically 3 (three) times daily  . diphenhydrAMINE (BENADRYL) 25 mg tablet Take by mouth. Take 25 mg by mouth every 6 (six) hours as needed for itching  . fluticasone (FLONASE) 50 mcg/actuation nasal spray instill 2 sprays into each nostril once daily if needed 0  . hydroCHLOROthiazide (MICROZIDE) 12.5 mg capsule Take 12.5 mg by mouth once daily.  [name] ibuprofen (ADVIL,MOTRIN) 800 MG tablet Take 1 tablet (800 mg total) by mouth every 8 (eight) hours as needed for Pain 3-4 times a day as needed for pain 30 tablet 1  . lamoTRIgine (LAMICTAL) 200 MG tablet 200 mg once daily  . lidocaine (LIDODERM) 5 % patch Place 1 patch onto the skin daily Apply patch to the most painful area for up to 12 hours in a 24 hour period. 30 patch 2  . lisinopril (PRINIVIL,ZESTRIL) 40 MG tablet Take 40 mg by mouth once daily.  . meloxicam (MOBIC) 15 MG tablet meloxicam 15 mg tablet  . omeprazole (PRILOSEC) 40 MG DR capsule take 1 capsule by mouth once daily for REFLUX 0  . timolol hemihydrate (BETIMOL) 0.5 % ophthalmic solution Place 1 drop into both eyes 2 (two) times daily  . tiZANidine (ZANAFLEX) 2 MG tablet Take 1 tablet (2 mg total) by mouth 3 (three) times daily as needed for Muscle spasms 60 tablet 0   No current facility-administered medications for this visit.   Allergies:  . Hydroxyzine Hives and Rash  . Dilaudid [Hydromorphone (Bulk)] Itching  . Gabapentin Unknown  "it is like she is going under anesthesia"  . Hydrocodone Hives and Rash  "terrible scratching"  "  terrible scratching"  Make take with benadryl  . Ketorolac Unknown and Rash  May take with benadryl May take with benadryl . Oxycodone-Acetaminophen Itching and Rash  May take with benadryl   Social History:  Social History   Socioeconomic History  . Marital status: Single  Spouse name: Not on file  . Number of children: Not on file  . Years of education: Not on file  . Highest  education level: Not on file  Occupational History  . Not on file  Tobacco Use  . Smoking status: Current Every Day Smoker  Packs/day: 0.50  Years: 30.00  Pack years: 15.00  Types: Cigarettes  Last attempt to quit: 08/04/2016  Years since quitting: 3.1  . Smokeless tobacco: Never Used  Vaping Use  . Vaping Use: Never used  Substance and Sexual Activity  . Alcohol use: No  . Drug use: No  . Sexual activity: Defer  Other Topics Concern  . Not on file  Social History Narrative  . Not on file   Social Determinants of Health   Financial Resource Strain:  . Difficulty of Paying Living Expenses:  Food Insecurity:  . Worried About Charity fundraiser in the Last Year:  . Arboriculturist in the Last Year:  Transportation Needs:  . Film/video editor (Medical):  [name] Lack of Transportation (Non-Medical):  Physical Activity:  . Days of Exercise per Week:  . Minutes of Exercise per Session:  Stress:  . Feeling of Stress :  Social Connections:  . Frequency of Communication with Friends and Family:  . Frequency of Social Gatherings with Friends and Family:  . Attends Religious Services:  . Active Member of Clubs or Organizations:  . Attends Archivist Meetings:  [name] Marital Status:   Family History:  . Cancer Mother  . Arthritis Father  . Diabetes Father  . Cancer Father  . Irregular Heart Beat (Arrhythmia) Sister  . Irregular Heart Beat (Arrhythmia) Brother   Review of Systems:  A 10+ ROS was performed, reviewed, and the pertinent orthopaedic findings are documented in the HPI.   Physical Examination:  BP 130/80  Ht 160 cm (5\' 3" )  Wt 72.8 kg (160 lb 6.4 oz)  BMI 28.41 kg/m   Patient is a well-developed, well-nourished female in no acute distress. Patient has normal mood and affect. Patient is alert and oriented to person, place, and time.   HEENT: Atraumatic, normocephalic. Pupils equal and reactive to light. Extraocular motion intact. Noninjected  sclera.  Cardiovascular: Regular rate and rhythm, with no murmurs, rubs, or gallops. Radial pulse 2+.  Respiratory: Lungs clear to auscultation bilaterally.   Right shoulder: SKIN:Normal SWELLING:Mild swelling diffusely around shoulder without erythema WARMTH:None LYMPH NODES:No adenopathy palpable CREPITUS:Mildintermittent glenohumeral crepitance TENDERNESS: Moderate diffuse tendernessto palpation over anterior and lateral aspects of shoulder ROM (active):  Forward flexion:30 degrees Abduction:25 degrees Internal rotation: Right iliac crest ROM (passive):  Forward flexion: 60 degrees Abduction:40 degrees ER/IR at 90 abd: Not evaluated  She has significant pain with all motions.  STRENGTH: Forward flexion: 3/5 Abduction: 3/5 External rotation: 3+/5 Internal rotation: 3+-4/5 Pain with RC testing: Moderate to severe pain with resisted strength testing  Sensation intact of the median, radial, ulnar, and axillary nerve distributions. Patient able to make a thumbs up sign, crisscross the second and third digits, and make an okay sign.  X-rays: No new radiographs were obtained today. Previous radiographs of the right shoulder reveal moderate narrowing of the glenohumeral joint space with osteophyte  formation noted at the inferior humeral head. Mild loss of subacromial space. No fracture or dislocation noted.  Impression:  1. Primary osteoarthritis of right shoulder  2. Rotator cuff tendinitis, right   Plan:  The patient will benefit from a right total shoulder arthroplasty. The risks of surgery, including infection and blood clots, were discussed with the patient. Having failed conservative treatment, the patient has elected to proceed with surgery. The risks of surgery, including blood clots and infection, were discussed with the patient. Measures  taken to reduce these risks were also discussed with the patient. The postoperative course was discussed with the patient. Patient was instructed to stop all blood thinners prior to surgery. Patient understands and elects to proceed with surgery. Patient will undergo a right total shoulder arthroplasty by Dr. Roland Rack on 09/30/2019.   H&P reviewed and patient re-examined. No changes.

## 2019-09-30 NOTE — Op Note (Signed)
09/30/2019  4:40 PM  Patient:   Sydney Evans  Pre-Op Diagnosis:   Degenerative joint disease, right shoulder.  Post-Op Diagnosis:   Same.  Procedure:   Right total shoulder arthroplasty.  Surgeon:   Pascal Lux, MD  Assistant:   Cameron Proud, PA-C  Anesthesia:   GET with an interscalene block using Exparel placed by the anesthesiologist.  Findings:   As above. The rotator cuff was in satisfactory condition.  Complications:   None  EBL:  200 cc  Fluids:   700 cc crystalloid  UOP:   None  TT:   None  Drains:   None  Closure:   Staples  Implants:   Biomet Comprehensive system with a pressfit #10 mini-humeral stem, a 42 x 18 mm humeral head, and a small cemented glenoid component with a Regenerex post.  Brief Clinical Note:   The patient is a 69 year old female with a long history of gradually worsening right shoulder pain. Her symptoms have progressed despite medications, activity modification, etc. Her history and examination are consistent with advanced degenerative joint disease. Her preoperative MRI demonstrated a an intact rotator cuff. The patient presents at this time for a right total shoulder arthroplasty.  Procedure:   The patient underwent placement of an interscalene block using Exparel in the preoperative holding area by the anesthesiologist before she was brought into the operating room and lain in the supine position. After satisfactory general endotracheal intubation and anesthesia was achieved, the patient was repositioned in the beach chair position using the beach chair positioner. The right shoulder and upper extremity were prepped with ChloraPrep solution before being draped sterilely. Preoperative antibiotics were administered.   A timeout was performed to verify the appropriate surgical site before a standard anterior approach to the shoulder was made through an approximately 4-5 inch incision. The incision was carried down through the subcutaneous  tissues to expose the deltopectoral fascia. The interval between the deltoid and pectoralis muscles was identified and this plane developed, retracting the cephalic vein laterally with the deltoid muscle. The conjoined tendon was identified. The lateral margin was dissected and the Kolbel self-retraining retractor inserted. The "three sisters" were identified and cauterized. Bursal tissues were removed to improve visualization. The biceps tendon was identified in the bicipital groove, and a soft tissue biceps tenodesis performed using 2 #0 Ethibond interrupted sutures, tacking the distal portion of the biceps tendon to the adjacent pectoralis major tendon tissues. The subscapularis tendon was released from its attachment to the lesser tuberosity 1 cm proximal to its insertion and several tagging sutures placed. The inferior capsule was released with care after identifying and protecting the axillary nerve. The proximal humeral cut was made at approximately 30 of retroversion using the extra-medullary guide.   Attention was directed to the glenoid. The labrum was debrided circumferentially before the center of the glenoid was marked with electrocautery. The small and medium sizers were positioned and it was elected to proceed with a small glenoid component. The guidewire was drilled into the glenoid neck using the appropriate guide. After verifying its position, the glenoid was lightly reamed with the butterfly reamer before the centralizing Regenerex post reamer was used. The small peripheral peg guide was positioned and each of the three pegs drilled sequentially, leaving the preceding drill bit in place so as to minimize shifting of the guide. The bony surfaces were prepared for cementing by irrigating them thoroughly with bacitracin saline solution using the jet lavage system, then packing the glenoid with a  Neo-Synephrine soaked sponge. Meanwhile, cement was mixed on the back table. When it was ready, some  cement was injected into each of the three peg holes using a Toomey syringe and additional cement applied to the posterior aspect of the glenoid component. The component was impacted into place and the excess cement was removed using a Surveyor, quantity. Pressure was maintained on the glenoid until the cement hardened.  Attention was directed to the humeral side. The humeral canal was reamed sequentially beginning with the end-cutting reamer then progressing from a 4 mm reamer up to a 10 mm reamer. This provided excellent circumferential chatter. The canal was broached beginning with a #8 broach and progressing to a #10 broach. This was left in place and a trial reduction performed using the 42 x 18 mm trial humeral head. The arm demonstrated excellent range of motion as the hand could be brought across the chest to the opposite shoulder and brought to the top of the patient's head and to the patient's ear. The shoulder remained stable throughout this range of motion, and was stable with abduction and external rotation. The joint was dislocated and the trial components removed. The permanent #10 mini-stem was impacted into place with care taken to maintain the appropriate version. The permanent 42 x 18 mm humeral head with the standard taper set in the "B" position was put together on the back table and impacted into place. Again, the [facility] taper locking mechanism was verified using manual distraction. The shoulder was relocated and again placed through a range of motion with the findings as described above.  The wound was copiously irrigated with sterile saline solution using the jet lavage system before a total of 30 cc of 0.5% Sensorcaine with epinephrine was injected into the pericapsular and peri-incisional tissues to help with postoperative analgesia. The subscapularis tendon was reapproximated using #2 FiberWire interrupted sutures. The deltopectoral interval was closed using #0 Vicryl interrupted sutures  before the subcutaneous tissues were closed using 2-0 Vicryl interrupted sutures. The skin was closed using staples. Prior to closing the skin, 1 g of transexemic acid in 10 cc of normal saline was injected intra-articularly to help with postoperative bleeding. A sterile occlusive dressing was applied to the wound before the arm was placed into a shoulder immobilizer with an abduction pillow. A polar care device also was applied to the shoulder. The patient was then transferred back to a hospital bed before being awakened, extubated, and returned to the recovery room in satisfactory condition after tolerating the procedure well.

## 2019-09-30 NOTE — Evaluation (Signed)
Physical Therapy Evaluation Patient Details Name: Sydney Evans MRN: [id number] DOB: November 14, 1950 Today's Date: 09/30/2019   History of Present Illness  69 y/o female s/p R total shoulder replacement 5/27.    Clinical Impression  Pt seen POD0 and did very well with all PT metrics.  She still lacked any R finger/hand movement but showed great mobility to get to EOB, in getting to standing and very easily and confidently was able to ambulate >100 ft with only minimal use of cane (holding it up off the ground much of the time.) Pt lives alone and reports that her niece is going to be helping her out, PT did reiterate that she will be in a sling for a while and will need help with even simple tasks for a while - encouraged her to insure niece is planning on being quite available, at least initially.  Pt had partial L shoulder replacement ~3 years ago and assures me she knows what she is going to be limited with and feels confident about her discharge plan.      Follow Up Recommendations Follow surgeon's recommendation for DC plan and follow-up therapies, intermittent supervision    Equipment Recommendations  None recommended by PT    Recommendations for Other Services       Precautions / Restrictions Precautions Precautions: Shoulder;Fall Type of Shoulder Precautions: total Shoulder Interventions: Shoulder sling/immobilizer Required Braces or Orthoses: Sling Restrictions Weight Bearing Restrictions: Yes RUE Weight Bearing: Non weight bearing      Mobility  Bed Mobility Overal bed mobility: Modified Independent             General bed mobility comments: Pt was able to get herself to sitting EOB (HOB ~30 deg) w/o assist or hesitation  Transfers Overall transfer level: Modified independent Equipment used: Straight cane             General transfer comment: Pt was able to rise and return to sitting (post ambulation) without assist, safety issues or other concerns.  Good  overall confidence and safety  Ambulation/Gait Ambulation/Gait assistance: Supervision Gait Distance (Feet): 150 Feet Assistive device: Straight cane;None       General Gait Details: Pt started out with only light use of the [facility] and legitimately did not use it for ~half the bout of ambulation with no LOBs or other ill effect.  Vitals stable t/o the effort.  Stairs            Wheelchair Mobility    Modified Rankin (Stroke Patients Only)       Balance Overall balance assessment: Modified Independent(good balance with SPC, fair w/o UE)                                           Pertinent Vitals/Pain Pain Assessment: No/denies pain(R UE still numb post sx)    Home Living Family/patient expects to be discharged to:: Private residence Living Arrangements: Alone Available Help at Discharge: Family(neice is going to be helping, lives very close)   Home Access: Level entry     Home Layout: One level Home Equipment: Environmental consultant - 4 wheels;Cane - single point      Prior Function Level of Independence: Independent with assistive device(s)         Comments: Pt reports that she uses 4WW much of the time, but will use cane or no AD depending on the day and how she  feels     Hand Dominance        Extremity/Trunk Assessment   Upper Extremity Assessment Upper Extremity Assessment: (R in sling, L (h/o partial replacement) elevation to ~120)    Lower Extremity Assessment Lower Extremity Assessment: Overall WFL for tasks assessed       Communication   Communication: No difficulties  Cognition Arousal/Alertness: Awake/alert Behavior During Therapy: WFL for tasks assessed/performed Overall Cognitive Status: Within Functional Limits for tasks assessed                                        General Comments      Exercises     Assessment/Plan    PT Assessment Patient needs continued PT services  PT Problem List Decreased range of  motion;Decreased strength;Decreased balance;Decreased activity tolerance;Decreased mobility;Decreased coordination;Decreased knowledge of use of DME;Decreased safety awareness;Decreased knowledge of precautions;Pain       PT Treatment Interventions DME instruction;Gait training;Stair training;Functional mobility training;Therapeutic activities;Therapeutic exercise;Balance training;Neuromuscular re-education;Patient/family education    PT Goals (Current goals can be found in the Care Plan section)  Acute Rehab PT Goals Patient Stated Goal: get back to normal  PT Goal Formulation: With patient Time For Goal Achievement: 10/14/19 Potential to Achieve Goals: Good    Frequency BID   Barriers to discharge        Co-evaluation               AM-PAC PT "6 Clicks" Mobility  Outcome Measure Help needed turning from your back to your side while in a flat bed without using bedrails?: A Little Help needed moving from lying on your back to sitting on the side of a flat bed without using bedrails?: A Little Help needed moving to and from a bed to a chair (including a wheelchair)?: None Help needed standing up from a chair using your arms (e.g., wheelchair or bedside chair)?: None Help needed to walk in hospital room?: None Help needed climbing 3-5 steps with a railing? : A Little 6 Click Score: 21    End of Session Equipment Utilized During Treatment: Gait belt Activity Tolerance: Patient tolerated treatment well Patient left: in chair;with call bell/phone within reach;with nursing/sitter in room Nurse Communication: Mobility status PT Visit Diagnosis: Muscle weakness (generalized) (M62.81);Difficulty in walking, not elsewhere classified (R26.2);Pain Pain - Right/Left: Right Pain - part of body: Shoulder    Time: [id number] PT Time Calculation (min) (ACUTE ONLY): 23 min   Charges:   PT Evaluation $PT Eval Low Complexity: 1 Low PT Treatments $Gait Training: 8-22 mins         Kreg Shropshire, DPT 09/30/2019, 4:58 PM

## 2019-09-30 NOTE — Discharge Instructions (Signed)
Diet: As you were doing prior to hospitalization  ? ?Shower:  May shower but keep the wounds dry, use an occlusive plastic wrap, NO SOAKING IN TUB.  If the bandage gets wet, change with a clean dry gauze. ? ?Dressing:  You may change your dressing as needed. Change the dressing with sterile gauze dressing.   ? ?Activity:  Increase activity slowly as tolerated, but follow the weight bearing instructions below.  No lifting or driving for 6 weeks. ? ?Weight Bearing:   Non-weightbearing to the right arm. ? ?To prevent constipation: you may use a stool softener such as - ? ?Colace (over the counter) 100 mg by mouth twice a day  ?Drink plenty of fluids (prune juice may be helpful) and high fiber foods ?Miralax (over the counter) for constipation as needed.   ? ?Itching:  If you experience itching with your medications, try taking only a single pain pill, or even half a pain pill at a time.  You may take up to 10 pain pills per day, and you can also use benadryl over the counter for itching or also to help with sleep.  ? ?Precautions:  If you experience chest pain or shortness of breath - call 911 immediately for transfer to the hospital emergency department!! ? ?If you develop a fever greater that 101 F, purulent drainage from wound, increased redness or drainage from wound, or calf pain-Call [facility]                                              ?Follow- Up Appointment:  Please call for an appointment to be seen in 2 weeks at [facility]  ?

## 2019-09-30 NOTE — Anesthesia Procedure Notes (Signed)
Procedure Name: Intubation Date/Time: 09/30/2019 11:10 AM Performed by: Natasha Mead, CRNA Pre-anesthesia Checklist: Patient identified, Emergency Drugs available, Suction available, Patient being monitored and Timeout performed Patient Re-evaluated:Patient Re-evaluated prior to induction Oxygen Delivery Method: Circle system utilized Preoxygenation: Pre-oxygenation with 100% oxygen Induction Type: IV induction Ventilation: Mask ventilation without difficulty Laryngoscope Size: Miller and 2 Grade View: Grade I Tube type: Oral Tube size: 7.0 mm Number of attempts: 1 Airway Equipment and Method: Stylet Placement Confirmation: ETT inserted through vocal cords under direct vision,  positive ETCO2 and breath sounds checked- equal and bilateral Secured at: 21 cm Tube secured with: Tape Dental Injury: Teeth and Oropharynx as per pre-operative assessment

## 2019-09-30 NOTE — Anesthesia Postprocedure Evaluation (Signed)
Anesthesia Post Note  Patient: Sydney Evans  Procedure(s) Performed: TOTAL SHOULDER ARTHROPLASTY (Right Shoulder)  Patient location during evaluation: PACU Anesthesia Type: General Level of consciousness: awake and alert Pain management: pain level controlled (Good block fxn) Vital Signs Assessment: post-procedure vital signs reviewed and stable Respiratory status: spontaneous breathing, nonlabored ventilation, respiratory function stable and patient connected to nasal cannula oxygen Cardiovascular status: blood pressure returned to baseline and stable Postop Assessment: no apparent nausea or vomiting Anesthetic complications: no     Last Vitals:  Vitals:   09/30/19 1413 09/30/19 1428  BP: 122/65 128/67  Pulse: 60 (!) 55  Resp: 19 17  Temp:  36.6 C  SpO2: 99% 97%    Last Pain:  Vitals:   09/30/19 1428  TempSrc:   PainSc: 0-No pain                 Alphonsus Sias

## 2019-09-30 NOTE — Transfer of Care (Signed)
Immediate Anesthesia Transfer of Care Note  Patient: Sydney Evans  Procedure(s) Performed: TOTAL SHOULDER ARTHROPLASTY (Right Shoulder)  Patient Location: PACU  Anesthesia Type:General  Level of Consciousness: awake, alert  and oriented  Airway & Oxygen Therapy: Patient Spontanous Breathing  Post-op Assessment: Report given to RN  Post vital signs: stable  Last Vitals:  Vitals Value Taken Time  BP    Temp    Pulse 57 09/30/19 1443  Resp 15 09/30/19 1443  SpO2 98 % 09/30/19 1443  Vitals shown include unvalidated device data.  Last Pain:  Vitals:   09/30/19 1428  TempSrc:   PainSc: 0-No pain      Patients Stated Pain Goal: 0 ([id number])  Complications: No apparent anesthesia complications

## 2019-09-30 NOTE — Anesthesia Procedure Notes (Signed)
Anesthesia Regional Block: Interscalene brachial plexus block   Pre-Anesthetic Checklist: ,, timeout performed, Correct Patient, Correct Site, Correct Laterality, Correct Procedure, Correct Position, site marked, Risks and benefits discussed,  Surgical consent,  Pre-op evaluation,  At surgeon's request and post-op pain management  Laterality: Upper and Right  Prep: chloraprep       Needles:  Injection technique: Single-shot  Needle Type: Echogenic Stimulator Needle     Needle Length: 10cm  Needle Gauge: 21     Additional Needles:   Procedures: Doppler guided,,,, ultrasound used (permanent image in chart),,,,  Motor weakness within 4 minutes.  Narrative:  Start time: 09/30/2019 9:05 AM End time: 09/30/2019 9:14 AM Injection made incrementally with aspirations every 5 mL.  Performed by: Personally  Anesthesiologist: Alphonsus Sias, MD  Additional Notes: Functioning IV was confirmed and O2 [place]/monitors were applied. Light sedation administered as required, patient responsive throughout. A 154mm 21ga EchoStim needle was used. Sterile prep and drape,hand hygiene and sterile gloves were used.  Negative aspiration and negative test dose prior to incremental administration of local anesthetic. 1% Lidocaine for skin wheal, 4 ml. Total LA: 57ml - Exparel 55ml & 0.5% Bupivicaine 54ml. U/S images stored in chart. The patient tolerated the procedure well.

## 2019-09-30 NOTE — Anesthesia Preprocedure Evaluation (Addendum)
Anesthesia Evaluation  Patient identified by MRN, date of birth, ID band Patient awake    Reviewed: Allergy & Precautions, H&P , NPO status , reviewed documented beta blocker date and time   Airway Mallampati: II  TM Distance: <3 FB Neck ROM: limited    Dental  (+) Upper Dentures, Lower Dentures, Dental Advidsory Given   Pulmonary Current Smoker and Patient abstained from smoking.,    Pulmonary exam normal        Cardiovascular hypertension, Normal cardiovascular exam  2016 ECHO Study Conclusions   - Left ventricle: The cavity size was normal. Wall thickness was  normal. Systolic function was normal. The estimated ejection  fraction was in the range of 55% to 60%. Wall motion was normal;  there were no regional wall motion abnormalities. Doppler  parameters are consistent with abnormal left ventricular  relaxation (grade 1 diastolic dysfunction).  - Left atrium: The atrium was mildly dilated.    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 No date: Carotid arterial disease (HCC) 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 No date: Depression No date: Elevated lipids No date: GERD (gastroesophageal reflux disease) No date: Hypertension No date: Migraine No date: Osteoporosis No date: Poor balance No date: Sinus congestion No date: Stroke [facility])     Comment:  x 2 No date:  TIA (transient ischemic attack) No date: Vertigo Past Surgical History: No date: ABDOMINAL HYSTERECTOMY No date: BACK SURGERY No date: BREAST BIOPSY; Left No date: CARPAL TUNNEL RELEASE; Bilateral No date: CATARACT EXTRACTION W/ INTRAOCULAR LENS  IMPLANT, BILATERAL;  Bilateral No date: CHOLECYSTECTOMY 04/09/2018: COLONOSCOPY WITH PROPOFOL; N/A     Comment:  Procedure: COLONOSCOPY WITH PROPOFOL;  Surgeon: Jonathon Bellows, MD;  Location: [facility] ENDOSCOPY;  Service:               Gastroenterology;  Laterality: N/A; No date: crystal cyst removed on left foot 04/09/2018: ESOPHAGOGASTRODUODENOSCOPY (EGD) WITH PROPOFOL; N/A     Comment:  Procedure: ESOPHAGOGASTRODUODENOSCOPY (EGD) WITH               PROPOFOL;  Surgeon: Jonathon Bellows, MD;  Location: [facility]               ENDOSCOPY;  Service: Gastroenterology;  Laterality: N/A; No date: EYE SURGERY No date: femoral fx No date: FOOT SURGERY 06/16/2018: GIVENS CAPSULE STUDY; N/A     Comment:  Procedure: GIVENS CAPSULE STUDY;  Surgeon: Jonathon Bellows,               MD;  Location: [facility] ENDOSCOPY;  Service:               Gastroenterology;  Laterality: N/A; No date: JOINT REPLACEMENT No date: KNEE SURGERY No date: TONSILLECTOMY No date: TOTAL KNEE ARTHROPLASTY; Bilateral 08/01/2016: TOTAL SHOULDER ARTHROPLASTY; Left     Comment:  Procedure:  TOTAL SHOULDER ARTHROPLASTY;  Surgeon: Corky Mull, MD;  Location: [facility] ORS;  Service: Orthopedics;                Laterality: Left;   Reproductive/Obstetrics                           Anesthesia Physical Anesthesia Plan  ASA: III  Anesthesia Plan: General   Post-op Pain Management:  Regional for Post-op pain   Induction: Intravenous  PONV Risk Score and Plan: 2 and Ondansetron, Dexamethasone, Midazolam and Treatment may vary due to age or medical condition  Airway Management Planned: Oral ETT  Additional Equipment:   Intra-op Plan:   Post-operative Plan:  Extubation in OR  Informed Consent: I have reviewed the patients History and Physical, chart, labs and discussed the procedure including the risks, benefits and alternatives for the proposed anesthesia with the patient or authorized representative who has indicated his/her understanding and acceptance.     Dental Advisory Given  Plan Discussed with: CRNA  Anesthesia Plan Comments:         Anesthesia Quick Evaluation

## 2019-10-01 LAB — BASIC METABOLIC PANEL
Anion gap: 5 (ref 5–15)
BUN: 9 mg/dL (ref 8–23)
CO2: 23 mmol/L (ref 22–32)
Calcium: 8.1 mg/dL — ABNORMAL LOW (ref 8.9–10.3)
Chloride: 106 mmol/L (ref 98–111)
Creatinine, Ser: 0.63 mg/dL (ref 0.44–1.00)
GFR calc Af Amer: >60 mL/min (ref >60)
GFR calc non Af Amer: >60 mL/min (ref >60)
Glucose, Bld: 100 mg/dL — ABNORMAL HIGH (ref 70–99)
Potassium: 3.4 mmol/L — ABNORMAL LOW (ref 3.5–5.1)
Sodium: 134 mmol/L — ABNORMAL LOW (ref 135–145)

## 2019-10-01 LAB — CBC
HCT: 29.6 % — ABNORMAL LOW (ref 36.0–46.0)
Hemoglobin: 8.9 g/dL — ABNORMAL LOW (ref 12.0–15.0)
MCH: 22.6 pg — ABNORMAL LOW (ref 26.0–34.0)
MCHC: 30.1 g/dL (ref 30.0–36.0)
MCV: 75.1 fL — ABNORMAL LOW (ref 80.0–100.0)
Platelets: 380 10*3/uL (ref 150–400)
RBC: 3.94 MIL/uL (ref 3.87–5.11)
RDW: 17.2 % — ABNORMAL HIGH (ref 11.5–15.5)
WBC: 6.1 10*3/uL (ref 4.0–10.5)
nRBC: 0 % (ref 0.0–0.2)

## 2019-10-01 MED ORDER — POTASSIUM CHLORIDE 20 MEQ PO PACK
20.0000 meq | PACK | ORAL | Status: AC
  Administered 2019-10-01: 20 meq via ORAL
  Filled 2019-10-01 (×2): qty 1

## 2019-10-01 MED ORDER — TRAMADOL HCL 50 MG PO TABS: 50.0000 mg | ORAL_TABLET | Freq: Four times a day (QID) | ORAL | Status: DC

## 2019-10-01 MED ORDER — TRAMADOL HCL 50 MG PO TABS
ORAL_TABLET | ORAL | Status: AC
  Administered 2019-10-01: 50 mg via ORAL
  Filled 2019-10-01: qty 1

## 2019-10-01 MED ORDER — ACETAMINOPHEN 500 MG PO TABS
ORAL_TABLET | ORAL | Status: AC
  Administered 2019-10-01: 1000 mg via ORAL
  Filled 2019-10-01: qty 2

## 2019-10-01 MED ORDER — ENOXAPARIN SODIUM 40 MG/0.4ML ~~LOC~~ SOLN
SUBCUTANEOUS | Status: AC
  Filled 2019-10-01: qty 0.4

## 2019-10-01 MED ORDER — CEFAZOLIN SODIUM-DEXTROSE 2-4 GM/100ML-% IV SOLN
INTRAVENOUS | Status: AC
  Filled 2019-10-01: qty 100

## 2019-10-01 MED ORDER — HYDRALAZINE HCL 10 MG PO TABS
10.0000 mg | ORAL_TABLET | Freq: Once | ORAL | Status: AC
  Administered 2019-10-01: 10 mg via ORAL
  Filled 2019-10-01: qty 1

## 2019-10-01 NOTE — Evaluation (Signed)
Occupational Therapy Evaluation Patient Details Name: Sydney Evans MRN: [id number] DOB: 1951/01/13 Today's Date: 10/01/2019    History of Present Illness 69 y/o female s/p R total shoulder replacement 5/27. PMH includes arthritis, b/l carpal tunnel, cancer (cervix and uterus), concussion (s/p 2019 fall), HTN, anemia, migrains, stroke, vertigo and current smoker.   Clinical Impression   Sydney Evans was seen for an OT evaluation this date. Pt lives alone in a 1 story home with level entry, tub/shower with grab bars and shower chair, an SPC and 4WW. Prior to surgery, pt was independent in ADL/IADL, used a 4WW for functional mobility and not driving (does not have a car, but niece lives nearby). Pt has orders for RUE to be immobilized and will be NWBing per MD. Patient presents with impaired strength/ROM and sensation to RUE with block not completely resolved in forearm, elbow and shoulder yet. These impairments result in a decreased ability to perform self care tasks requiring Mod A for UB dressing and bathing, Min A for LB dressing and Max A for application of polar care, compression stockings, and sling/immobilizer. Pt instructed in polar care mgt, compression stockings mgt, sling/immobilizer mgt, RUE precautions, adaptive strategies for bathing/dressing/toileting, positioning and considerations for sleep, and home/routines modifications to maximize falls prevention, safety, and independence. Handout provided. OT adjusted sling/immobilizer and polar care to improve comfort, optimize positioning, and to maximize skin integrity/safety. Pt verbalized understanding of all education/training provided. Pt will benefit from skilled OT services to address these limitations and improve independence in daily tasks. Recommend HHOT services to continue therapy to maximize return to PLOF, address home/routines modifications and safety, minimize falls risk, and minimize caregiver burden.       Follow Up  Recommendations  Home health OT    Equipment Recommendations  None recommended by OT    Recommendations for Other Services       Precautions / Restrictions Precautions Precautions: Shoulder;Fall Type of Shoulder Precautions: total Shoulder Interventions: Shoulder sling/immobilizer;Off for dressing/bathing/exercises Required Braces or Orthoses: Sling Restrictions Weight Bearing Restrictions: Yes RUE Weight Bearing: Non weight bearing      Mobility Bed Mobility Overal bed mobility: Modified Independent             General bed mobility comments: Pt sat up to EOB with HOB elevated without assist  Transfers Overall transfer level: Modified independent Equipment used: None             General transfer comment: Pt stood up from EOB and ambulated to bedside recliner with steady balance, no concerns. Did take increased time for standing/sitting for RUE mgt.    Balance Overall balance assessment: Modified Independent(overall steady static and dynamic sitting and standing balance, would benefit from increased time and supervision for ensured safety, no noted unsteadiness or LOB)                                         ADL either performed or assessed with clinical judgement   ADL Overall ADL's : Needs assistance/impaired                 Upper Body Dressing : Cueing for compensatory techniques;Moderate assistance;Adhering to UE precautions;Sitting;Cueing for UE precautions;Cueing for sequencing Upper Body Dressing Details (indicate cue type and reason): Pt requires Mod A for managing UB dressing, with Max A for managing polar care and shoulder sling in preparation. Physical assist required for threading RUE, pt  used LUE to complete dressing with Min A. Multimodal Mod cueing throughout. Lower Body Dressing: Minimal assistance;Sit to/from stand Lower Body Dressing Details (indicate cue type and reason): Min A needed to readjust R side of  underwear/shorts; independently donned sock with one hand Toilet Transfer: Ambulation;Regular Toilet;Supervision/safety           Functional mobility during ADLs: Supervision/safety General ADL Comments: Mod cueing throughout ADL for relaxing shoulder and maintaining shoulder precautions, as well as sequencing for new compensatory strategies.     Vision         Perception     Praxis      Pertinent Vitals/Pain Pain Assessment: No/denies pain     Hand Dominance Right   Extremity/Trunk Assessment Upper Extremity Assessment Upper Extremity Assessment: RUE deficits/detail RUE Deficits / Details: Immobilized in shoulder sling with total shoulder precautions RUE: Unable to fully assess due to immobilization RUE Sensation: (decreased sensation in RUE forearm, elbow and upper arm)   Lower Extremity Assessment Lower Extremity Assessment: Overall WFL for tasks assessed       Communication Communication Communication: No difficulties   Cognition Arousal/Alertness: Awake/alert Behavior During Therapy: WFL for tasks assessed/performed Overall Cognitive Status: Within Functional Limits for tasks assessed                                     General Comments       Exercises Other Exercises Other Exercises: Pt educated on compression stocking mgt, polar care mgt, shoulder sling/immobilizer mgt, total shoulder precautions in the context of functional activity and falls prevention strategies. Pt was receptive to all information, would benefit from additional practice. Handout provided for reinforcement.   Shoulder Instructions      Home Living Family/patient expects to be discharged to:: Private residence   Available Help at Discharge: Family(niece lives very close, will be helping)   Home Access: Level entry     Home Layout: One level     Bathroom Shower/Tub: Teacher, early years/pre: Standard     Home Equipment: Environmental consultant - 4 wheels;Cane -  single point;Hand held shower head          Prior Functioning/Environment Level of Independence: Independent with assistive device(s)        Comments: Per PT, pt reports that she uses 4WW much of the time, but will use cane or no AD depending on the day and how she feels. Pt still drives, but does not have a car (niece transports her). Enjoys puzzles.        OT Problem List: Decreased strength;Decreased range of motion;Decreased knowledge of precautions;Decreased knowledge of use of DME or AE;Impaired UE functional use      OT Treatment/Interventions: Self-care/ADL training;Therapeutic exercise;Therapeutic activities;DME and/or AE instruction;Patient/family education    OT Goals(Current goals can be found in the care plan section) Acute Rehab OT Goals Patient Stated Goal: To go back home OT Goal Formulation: With patient Time For Goal Achievement: 10/15/19 Potential to Achieve Goals: Good ADL Goals Pt Will Perform Upper Body Dressing: with set-up;sitting(while maintaining shoulder precautions) Additional ADL Goal #1: Pt will independently instruct caregiver in shoulder polar care management to maximize independence Additional ADL Goal #2: Pt will independently instruct caregiver in shoulder sling management to optimize recovery and adhere to safety precautions  OT Frequency: Min 2X/week   Barriers to D/C:            Co-evaluation  AM-PAC OT "6 Clicks" Daily Activity     Outcome Measure Help from another person eating meals?: None Help from another person taking care of personal grooming?: A Little Help from another person toileting, which includes using toliet, bedpan, or urinal?: None Help from another person bathing (including washing, rinsing, drying)?: A Lot Help from another person to put on and taking off regular upper body clothing?: A Lot Help from another person to put on and taking off regular lower body clothing?: A Little 6 Click Score: 18    End of Session Equipment Utilized During Treatment: Gait belt Nurse Communication: Mobility status  Activity Tolerance: Patient tolerated treatment well Patient left: in chair;with call bell/phone within reach;with nursing/sitter in room  OT Visit Diagnosis: Other abnormalities of gait and mobility (R26.89);Other (comment)(post-op shoulder deficits)                Time: [id number] OT Time Calculation (min): 48 min Charges:  OT General Charges $OT Visit: 1 Visit OT Evaluation $OT Eval Moderate Complexity: 1 Mod OT Treatments $Self Care/Home Management : 38-52 mins  Jerilynn Birkenhead, OTS 10/01/19, 11:19 AM

## 2019-10-01 NOTE — Progress Notes (Signed)
Physical Therapy Treatment Patient Details Name: Sydney Evans MRN: [id number] DOB: Jun 23, 1950 Today's Date: 10/01/2019    History of Present Illness 69 y/o female s/p R total shoulder replacement 5/27. PMH includes arthritis, b/l carpal tunnel, cancer (cervix and uterus), concussion (s/p 2019 fall), HTN, anemia, migrains, stroke, vertigo and current smoker.    PT Comments    Pt doing very well with mobility w/o AD.  She has been able to walk around pre-op area ad lib and reports feel very good about being able to manage at home.  Spent much of session trouble shooting polar care management, HEP and insuring that she does not "over do it" and that her niece is around enough to help with what she needs.  Pt reports she is "gung ho" to get gong and PT insured that she respects the "no active motion" in the shoulder and she assures me she remembers from last time and is aware.  Pt is ready to go home from PT stand-point.  Follow Up Recommendations  Follow surgeon's recommendation for DC plan and follow-up therapies     Equipment Recommendations  None recommended by PT    Recommendations for Other Services       Precautions / Restrictions Precautions Precautions: Shoulder;Fall Type of Shoulder Precautions: total Shoulder Interventions: Shoulder sling/immobilizer;Off for dressing/bathing/exercises Precaution Booklet Issued: Yes (comment) Precaution Comments: HEP  Required Braces or Orthoses: Sling Restrictions Weight Bearing Restrictions: Yes RUE Weight Bearing: Non weight bearing    Mobility  Bed Mobility Overal bed mobility: Modified Independent             General bed mobility comments: in recliner on arrival  Transfers Overall transfer level: Independent Equipment used: None             General transfer comment: easily, confidently and safely transitions sit to stand w/o UEs  Ambulation/Gait Ambulation/Gait assistance: Independent           General Gait  Details: PT saw pt walking back from bathroom on arrival, has been able to be up and going to the bathroom all night w/o concerns (confirmed with nursing)   Stairs             Wheelchair Mobility    Modified Rankin (Stroke Patients Only)       Balance Overall balance assessment: Independent                                          Cognition Arousal/Alertness: Awake/alert Behavior During Therapy: WFL for tasks assessed/performed Overall Cognitive Status: Within Functional Limits for tasks assessed                                        Exercises Other Exercises Other Exercises: Pt educated on compression stocking mgt, polar care mgt, shoulder sling/immobilizer mgt, total shoulder precautions in the context of functional activity and falls prevention strategies. Pt was receptive to all information, would benefit from additional practice. Handout provided for reinforcement.    General Comments        Pertinent Vitals/Pain Pain Assessment: No/denies pain    Home Living Family/patient expects to be discharged to:: Private residence   Available Help at Discharge: Family(niece lives very close, will be helping)   Home Access: Level entry   Home Layout: One level Home  Equipment: Gilford Rile - 4 wheels;Cane - single point;Hand held shower head      Prior Function Level of Independence: Independent with assistive device(s)      Comments: Per PT, pt reports that she uses 4WW much of the time, but will use cane or no AD depending on the day and how she feels. Pt still drives, but does not have a car (niece transports her). Enjoys puzzles.   PT Goals (current goals can now be found in the care plan section) Acute Rehab PT Goals Patient Stated Goal: To go back home Progress towards PT goals: Progressing toward goals    Frequency    BID      PT Plan Current plan remains appropriate    Co-evaluation              AM-PAC PT "6  Clicks" Mobility   Outcome Measure  Help needed turning from your back to your side while in a flat bed without using bedrails?: None Help needed moving from lying on your back to sitting on the side of a flat bed without using bedrails?: None Help needed moving to and from a bed to a chair (including a wheelchair)?: None Help needed standing up from a chair using your arms (e.g., wheelchair or bedside chair)?: None Help needed to walk in hospital room?: None Help needed climbing 3-5 steps with a railing? : None 6 Click Score: 24    End of Session   Activity Tolerance: Patient tolerated treatment well Patient left: in chair;with call bell/phone within reach;with nursing/sitter in room Nurse Communication: (ready for d/c from PT stand point) PT Visit Diagnosis: Muscle weakness (generalized) (M62.81);Difficulty in walking, not elsewhere classified (R26.2);Pain Pain - Right/Left: Right Pain - part of body: Shoulder     Time: [id number] PT Time Calculation (min) (ACUTE ONLY): 17 min  Charges:  $Therapeutic Exercise: 8-22 mins                     Kreg Shropshire, DPT 10/01/2019, 12:33 PM

## 2019-10-01 NOTE — Progress Notes (Signed)
  Subjective: 1 Day Post-Op Procedure(s) (LRB): TOTAL SHOULDER ARTHROPLASTY (Right) Patient reports pain as mild.   Patient is well, and has had no acute complaints or problems Plan is to go Home after hospital stay. Negative for chest pain and shortness of breath Fever: no Gastrointestinal:negative for nausea and vomiting  Objective: Vital signs in last 24 hours: Temp:  [97.4 F (36.3 C)-98.1 F (36.7 C)] 98 F (36.7 C) (05/28 0525) Pulse Rate:  [48-75] 56 (05/28 0525) Resp:  [16-19] 18 (05/28 0525) BP: (116-172)/(54-77) 172/68 (05/28 0525) SpO2:  [92 %-99 %] 99 % (05/28 0525) Weight:  [72.6 kg] 72.6 kg (05/27 0825)  Intake/Output from previous day:  Intake/Output Summary (Last 24 hours) at 10/01/2019 0725 Last data filed at 10/01/2019 0400 Gross per 24 hour  Intake 1025 ml  Output 200 ml  Net 825 ml    Intake/Output this shift: No intake/output data recorded.  Labs: Recent Labs    09/30/19 0852 10/01/19 0604  HGB 10.9* 8.9*   Recent Labs    09/30/19 0852 10/01/19 0604  WBC  --  6.1  RBC  --  3.94  HCT 32.0* 29.6*  PLT  --  380   Recent Labs    09/30/19 0852 10/01/19 0604  NA 136 134*  K 3.7 3.4*  CL 102 106  CO2  --  23  BUN 8 9  CREATININE 0.70 0.63  GLUCOSE 77 100*  CALCIUM  --  8.1*   No results for input(s): LABPT, INR in the last 72 hours.   EXAM General - Patient is Alert and Oriented Extremity - Neurovascular intact Sensation intact distally Dorsiflexion/Plantar flexion intact Dressing/Incision - clean, dry, no drainage Motor Function - intact, moving fingers and wrist well on exam.   Past Medical History:  Diagnosis Date  . Anemia   . Anxiety   . Arthritis   . Barrett's esophagus   . Bipolar disorder ([place])   . 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 Blood clot in vein   . Cancer ([place])    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  . Carotid arterial disease ([place])   . Contracture of joint of upper arm   . Depression   . Elevated lipids   . GERD  (gastroesophageal reflux disease)   . Hypertension   . Migraine   . Osteoporosis   . Poor balance   . Sinus congestion   . Stroke ([place])    x 2  . TIA (transient ischemic attack)   . Vertigo     Assessment/Plan: 1 Day Post-Op Procedure(s) (LRB): TOTAL SHOULDER ARTHROPLASTY (Right) Active Problems:   Status post total shoulder arthroplasty, right  Estimated body mass index is 28.35 kg/m as calculated from the following:   Height as of this encounter: 5\' 3"  (1.6 m).   Weight as of this encounter: 72.6 kg. Advance diet Discharge home  DVT Prophylaxis - Lovenox Shoulder immobilizer to Right arm  Reche Dixon, PA-C Orthopaedic Surgery 10/01/2019, 7:25 AM

## 2019-10-01 NOTE — Progress Notes (Signed)
All belongings returned, instructions given regarding exparel and polar care in addition to the AVS. PRINTED RX also given to the patient. She verbalized understanding of all discharge instruction.s .

## 2019-10-01 NOTE — TOC Initial Note (Signed)
Transition of Care [facility]) - Initial/Assessment Note    Patient Details  Name: Sydney Evans MRN: [id number] Date of Birth: May 15, 1950  Transition of Care [facility]) CM/SW Contact:    Anselm Pancoast, RN Phone Number: 10/01/2019, 11:28 AM  Clinical Narrative:                 Notified by MD patient is needing 2 weeks of home PT prior to starting OP PT 10/15/19. Patient will be starting new insurance of [employer] 10/05/19 and is hoping to be able to wait until 10/05/19 to start services to avoid extra copay. RN CM confirmed with Dr. Roland Rack that start of service 10/05/19 was fine. RN CM confirmed ability for [facility] to start services and notified patient. No further questions or concerns. Patient states she has no problems getting her medications as she has Medicaid as a backup.   Notified by Helene Kelp @ [facility] patient was already set up for [facility] as a preop and no further needs.         Patient Goals and CMS Choice        Expected Discharge Plan and Services           Expected Discharge Date: 10/01/19                                    Prior Living Arrangements/Services                       Activities of Daily Living Home Assistive Devices/Equipment: Gilford Rile (specify type), Shower chair with back, Hearing aid, Dentures (specify type), Blood pressure cuff ADL Screening (condition at time of admission) Patient's cognitive ability adequate to safely complete daily activities?: Yes Is the patient deaf or have difficulty hearing?: Yes(deaf in left ear hearing aids at home) Does the patient have difficulty seeing, even when wearing glasses/contacts?: No Does the patient have difficulty concentrating, remembering, or making decisions?: No Patient able to express need for assistance with ADLs?: No Does the patient have difficulty dressing or bathing?: No Independently performs ADLs?: Yes (appropriate for developmental age) Does the patient have difficulty walking or climbing stairs?:  No Weakness of Legs: None Weakness of Arms/Hands: None  Permission Sought/Granted                  Emotional Assessment              Admission diagnosis:  Status post total shoulder arthroplasty, right [Z96.611] Patient Active Problem List   Diagnosis Date Noted  . 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 Post-concussion headache 01/20/2018  . Iron deficiency anemia 10/05/2017  . Carotid stenosis 11/17/2016  . GERD (gastroesophageal reflux disease) 11/17/2016  . Essential hypertension 11/17/2016  . Hyperlipidemia 11/17/2016  . 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 Rotator cuff tendinitis, left 05/20/2016  . History of TIAs 07/17/2015  . 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 TIA (transient ischemic attack) 12/23/2014  . 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 Disorder of bone and articular cartilage 09/29/2014   PCP:  Patient, No Pcp Per Pharmacy:   [facility], [place] [address] Phone: [phone] Fax: [phone]     Social Determinants of Health (SDOH) Interventions    Readmission Risk Interventions No flowsheet data found.

## 2019-10-01 NOTE — Discharge Summary (Signed)
Physician Discharge Summary  Patient ID: Sydney Evans MRN: [id number] DOB/AGE: [birth year] 67 y.o.  Admit date: 09/30/2019 Discharge date: 10/01/2019  Admission Diagnoses:  Status post total shoulder arthroplasty, right [Z96.611]  Discharge Diagnoses: Patient Active Problem List   Diagnosis Date Noted  . 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 Post-concussion headache 01/20/2018  . Iron deficiency anemia 10/05/2017  . Carotid stenosis 11/17/2016  . GERD (gastroesophageal reflux disease) 11/17/2016  . Essential hypertension 11/17/2016  . Hyperlipidemia 11/17/2016  . 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 Rotator cuff tendinitis, left 05/20/2016  . History of TIAs 07/17/2015  . 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 TIA (transient ischemic attack) 12/23/2014  . 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 Disorder of bone and articular cartilage 09/29/2014    Past Medical History:  Diagnosis Date  . Anemia   . Anxiety   . Arthritis   . Barrett's esophagus   . Bipolar disorder ([place])   . 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 Blood clot in vein   . Cancer ([place])    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  . Carotid arterial disease ([place])   . Contracture of joint of upper arm   . Depression   . Elevated lipids   . GERD (gastroesophageal reflux disease)   . Hypertension   . Migraine   . Osteoporosis   . Poor balance   . Sinus congestion   . Stroke ([place])    x 2  . TIA (transient ischemic attack)   . Vertigo      Transfusion: None.   Consultants (if any):   Discharged Condition: Improved  Hospital Course: Sydney Evans is an 69 y.o. female who was admitted 09/30/2019 with a diagnosis of degenerative joint disease of the right shoulder and went to the operating room on 09/30/2019 and underwent the above named procedures.    Surgeries: Procedure(s): TOTAL SHOULDER ARTHROPLASTY on 09/30/2019 Patient tolerated the  surgery well. Taken to PACU where she was stabilized and then transferred to the orthopedic floor.  Started on Lovenox 40mg  q 24 hrs. Foot pumps applied bilaterally at 80 mm. Heels elevated on bed with rolled towels. No evidence of DVT. Negative [place]. Physical therapy started on day #1 for gait training and transfer. OT started day #1 for ADL and assisted devices.  Patient's IV was removed on POD1  Implants: [employer] Comprehensive system with a pressfit #10 mini-humeral stem, a 42 x 18 mm humeral head, and a small cemented glenoid component with a Regenerex post.  She was given perioperative antibiotics:  Anti-infectives (From admission, onward)   Start     Dose/Rate Route Frequency Ordered Stop   10/01/19 0503  ceFAZolin (ANCEF) 2-4 GM/100ML-% IVPB  Status:  Discontinued    Note to Pharmacy: Lorenza [place]   : cabinet override      10/01/19 0503 10/01/19 0539   09/30/19 1700  ceFAZolin (ANCEF) IVPB 2g/100 mL premix     2 g 200 mL/hr over 30 Minutes Intravenous Every 6 hours 09/30/19 1536 10/01/19 0012   09/30/19 0715  ceFAZolin (ANCEF) IVPB 2g/100 mL premix     2 g 200 mL/hr over 30 Minutes Intravenous On call to O.R. 09/30/19 0710 09/30/19 1114    .  She was given sequential compression devices, early ambulation, and Lovenox for DVT prophylaxis.  She benefited maximally from the hospital stay and there were no complications.    Recent vital signs:  Vitals:  10/01/19 0036 10/01/19 0525  BP: (!) 155/76 (!) 172/68  Pulse: (!) 58 (!) 56  Resp: 18 18  Temp: 97.8 F (36.6 C) 98 F (36.7 C)  SpO2: 99% 99%    Recent laboratory studies:  Lab Results  Component Value Date   HGB 8.9 (L) 10/01/2019   HGB 10.9 (L) 09/30/2019   HGB 9.6 (L) 09/24/2019   Lab Results  Component Value Date   WBC 6.1 10/01/2019   PLT 380 10/01/2019   Lab Results  Component Value Date   INR 0.92 11/15/2016   Lab Results  Component Value Date   NA 134 (L) 10/01/2019   K 3.4 (L) 10/01/2019    CL 106 10/01/2019   CO2 23 10/01/2019   BUN 9 10/01/2019   CREATININE 0.63 10/01/2019   GLUCOSE 100 (H) 10/01/2019    Discharge Medications:   Allergies as of 10/01/2019      Reactions   Hydroxyzine Hives, Rash   Gabapentin    Dizzy and confusion   Hydrocodone Hives, Rash   "terrible scratching"  Make take with benadryl   Ketorolac Rash    May take with benadryl   Percocet [oxycodone-acetaminophen] Itching, Rash   May take with benadryl     Toradol [ketorolac Tromethamine] Rash   May take with benadryl      Medication List    TAKE these medications   aspirin EC 325 MG tablet Take 1 tablet (325 mg total) by mouth daily.   citalopram 40 MG tablet Commonly known as: CELEXA Take 40 mg by mouth daily.   diclofenac Sodium 1 % Gel Commonly known as: VOLTAREN Apply 1 application topically daily as needed.   diphenhydrAMINE 25 MG tablet Commonly known as: BENADRYL Take 25 mg by mouth every 6 (six) hours as needed for itching.   fluticasone 50 MCG/ACT nasal spray Commonly known as: FLONASE Place 2 sprays into both nostrils daily as needed for allergies or rhinitis.   hydrochlorothiazide 25 MG tablet Commonly known as: HYDRODIURIL Take 25 mg by mouth daily.   IBU 800 MG tablet Generic drug: ibuprofen Take 800 mg by mouth daily as needed for mild pain or moderate pain.   lamoTRIgine 200 MG tablet Commonly known as: LAMICTAL Take 200 mg by mouth daily.   lidocaine 5 % Commonly known as: LIDODERM Place 1 patch onto the skin daily as needed (arthritis).   lisinopril 40 MG tablet Commonly known as: ZESTRIL Take 40 mg by mouth daily.   LUBRICANT EYE DROPS OP Place 1 drop into both eyes daily as needed (Dry eye). Systane   omeprazole 40 MG capsule Commonly known as: PRILOSEC Take 40 mg by mouth daily.   oxyCODONE 5 MG immediate release tablet Commonly known as: Oxy IR/ROXICODONE Take 1-2 tablets (5-10 mg total) by mouth every 4 (four) hours as needed for  moderate pain (pain score 4-6).   tiZANidine 2 MG tablet Commonly known as: ZANAFLEX Take 2 mg by mouth 2 (two) times daily as needed for muscle spasms.   traMADol 50 MG tablet Commonly known as: ULTRAM Take 1 tablet (50 mg total) by mouth every 6 (six) hours.   Travoprost (BAK Free) 0.004 % Soln ophthalmic solution Commonly known as: TRAVATAN Place 1 drop into both eyes at bedtime.       Diagnostic Studies: MR SHOULDER RIGHT WO CONTRAST  Result Date: 09/10/2019 CLINICAL DATA:  Right shoulder pain and limited range of motion for 5 months EXAM: MRI OF THE RIGHT SHOULDER WITHOUT CONTRAST  TECHNIQUE: Multiplanar, multisequence MR imaging of the shoulder was performed. No intravenous contrast was administered. COMPARISON:  MRI 03/15/2019 FINDINGS: Technical note: Examination is mildly degraded by patient motion. Rotator cuff: Moderate supraspinatus and infraspinatus tendinosis without tear. Intrasubstance tear of the mid insertional fibers of the subscapularis tendon (series 8, images 13-14). Intact teres minor. Muscles: No atrophy or abnormal signal of the muscles of the rotator cuff. Biceps long head: Intra-articular biceps tendinosis. Moderate tenosynovial fluid within the biceps tendon sheath. Acromioclavicular Joint: Mild arthropathy of the acromioclavicular joint. No subacromial/subdeltoid bursal fluid. Glenohumeral Joint: Small-moderate-sized glenohumeral joint effusion with internal heterogeneity suggesting synovitis. Moderate to severe osteoarthritis with areas of full-thickness chondral loss involving both the humeral head and glenoid. New chondral delamination with flap component involving the posterior aspect of the humeral head (series 7, image 12). Labrum:  Circumferentially degenerated. Bones: No acute fracture or dislocation. Large humeral head marginal osteophyte formation. Other: None. IMPRESSION: 1. 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 2. New  cartilage delamination of the posterior aspect of the humeral head. 3. Moderate supraspinatus and infraspinatus tendinosis without discrete tear. 4. Intrasubstance tear of the mid insertional fibers of the subscapularis tendon, similar to prior. 5. Intra-articular biceps tendinosis. Moderate biceps tenosynovitis. 6. Small-to-moderate glenohumeral joint effusion with synovitis. Electronically Signed   By: Davina Poke D.O.   On: 09/10/2019 08:50   DG Shoulder Right Port  Result Date: 09/30/2019 CLINICAL DATA:  Right shoulder arthroplasty EXAM: PORTABLE RIGHT SHOULDER COMPARISON:  None. FINDINGS: Frontal and transscapular views of the right shoulder demonstrate expected position of right shoulder arthroplasty without complication. Postsurgical changes are seen in the overlying soft tissues. The right chest is clear. IMPRESSION: 1. Unremarkable right shoulder arthroplasty. Electronically Signed   By: Randa Ngo M.D.   On: 09/30/2019 17:00   [place] OR NERVE BLOCK-IMAGE ONLY [facility])  Result Date: 09/30/2019 There is no interpretation for this exam.  This order is for images obtained during a surgical procedure.  Please See "Surgeries" Tab for more information regarding the procedure.   Disposition: Discharge disposition: 01-Home or Self Care     Plan is for discharge home on 10/01/19 pending progress with PT.  Follow-up Information    Lattie Corns, PA-C Follow up in 14 day(s).   Specialty: Physician Assistant Why: Electa Sniff information: [place] [address] [phone]          Signed: [place] Parma, Nonna Renninger PA-C 10/01/2019, 7:28 AM

## 2019-10-02 ENCOUNTER — Emergency Department: Payer: Medicare Other

## 2019-10-02 ENCOUNTER — Other Ambulatory Visit: Payer: Self-pay

## 2019-10-02 ENCOUNTER — Inpatient Hospital Stay
Admission: EM | Admit: 2019-10-02 | Discharge: 2019-10-11 | DRG: 982 | Disposition: A | Discharge status: Home Health | Payer: Medicare Other | Attending: Internal Medicine | Admitting: Internal Medicine

## 2019-10-02 DIAGNOSIS — Z83438 Family history of other disorder of lipoprotein metabolism and other lipidemia: Secondary | ICD-10-CM

## 2019-10-02 DIAGNOSIS — Z7982 Long term (current) use of aspirin: Secondary | ICD-10-CM

## 2019-10-02 DIAGNOSIS — K9189 Other postprocedural complications and disorders of digestive system: Secondary | ICD-10-CM | POA: Diagnosis not present

## 2019-10-02 DIAGNOSIS — Z888 Allergy status to other drugs, medicaments and biological substances status: Secondary | ICD-10-CM

## 2019-10-02 DIAGNOSIS — Z809 Family history of malignant neoplasm, unspecified: Secondary | ICD-10-CM

## 2019-10-02 DIAGNOSIS — K567 Ileus, unspecified: Secondary | ICD-10-CM | POA: Diagnosis present

## 2019-10-02 DIAGNOSIS — Y838 Other surgical procedures as the cause of abnormal reaction of the patient, or of later complication, without mention of misadventure at the time of the procedure: Secondary | ICD-10-CM | POA: Diagnosis present

## 2019-10-02 DIAGNOSIS — Z96653 Presence of artificial knee joint, bilateral: Secondary | ICD-10-CM | POA: Diagnosis present

## 2019-10-02 DIAGNOSIS — I472 Ventricular tachycardia: Secondary | ICD-10-CM

## 2019-10-02 DIAGNOSIS — Z96612 Presence of left artificial shoulder joint: Secondary | ICD-10-CM | POA: Diagnosis present

## 2019-10-02 DIAGNOSIS — Z8673 Personal history of transient ischemic attack (TIA), and cerebral infarction without residual deficits: Secondary | ICD-10-CM

## 2019-10-02 DIAGNOSIS — S46911A Strain of unspecified muscle, fascia and tendon at shoulder and upper arm level, right arm, initial encounter: Secondary | ICD-10-CM | POA: Diagnosis present

## 2019-10-02 DIAGNOSIS — I10 Essential (primary) hypertension: Secondary | ICD-10-CM | POA: Diagnosis present

## 2019-10-02 DIAGNOSIS — K219 Gastro-esophageal reflux disease without esophagitis: Secondary | ICD-10-CM | POA: Diagnosis present

## 2019-10-02 DIAGNOSIS — R9389 Abnormal findings on diagnostic imaging of other specified body structures: Secondary | ICD-10-CM

## 2019-10-02 DIAGNOSIS — Z20822 Contact with and (suspected) exposure to covid-19: Secondary | ICD-10-CM | POA: Diagnosis present

## 2019-10-02 DIAGNOSIS — M1990 Unspecified osteoarthritis, unspecified site: Secondary | ICD-10-CM | POA: Diagnosis present

## 2019-10-02 DIAGNOSIS — R579 Shock, unspecified: Secondary | ICD-10-CM

## 2019-10-02 DIAGNOSIS — R109 Unspecified abdominal pain: Secondary | ICD-10-CM

## 2019-10-02 DIAGNOSIS — Z419 Encounter for procedure for purposes other than remedying health state, unspecified: Secondary | ICD-10-CM

## 2019-10-02 DIAGNOSIS — I959 Hypotension, unspecified: Secondary | ICD-10-CM | POA: Diagnosis present

## 2019-10-02 DIAGNOSIS — D638 Anemia in other chronic diseases classified elsewhere: Secondary | ICD-10-CM | POA: Diagnosis present

## 2019-10-02 DIAGNOSIS — F17210 Nicotine dependence, cigarettes, uncomplicated: Secondary | ICD-10-CM | POA: Diagnosis present

## 2019-10-02 DIAGNOSIS — Y92019 Unspecified place in single-family (private) house as the place of occurrence of the external cause: Secondary | ICD-10-CM

## 2019-10-02 DIAGNOSIS — N179 Acute kidney failure, unspecified: Secondary | ICD-10-CM | POA: Diagnosis present

## 2019-10-02 DIAGNOSIS — E876 Hypokalemia: Secondary | ICD-10-CM | POA: Diagnosis present

## 2019-10-02 DIAGNOSIS — R1084 Generalized abdominal pain: Secondary | ICD-10-CM

## 2019-10-02 DIAGNOSIS — M810 Age-related osteoporosis without current pathological fracture: Secondary | ICD-10-CM | POA: Diagnosis present

## 2019-10-02 DIAGNOSIS — Z66 Do not resuscitate: Secondary | ICD-10-CM | POA: Diagnosis not present

## 2019-10-02 DIAGNOSIS — E785 Hyperlipidemia, unspecified: Secondary | ICD-10-CM | POA: Diagnosis present

## 2019-10-02 DIAGNOSIS — F319 Bipolar disorder, unspecified: Secondary | ICD-10-CM | POA: Diagnosis present

## 2019-10-02 DIAGNOSIS — G43909 Migraine, unspecified, not intractable, without status migrainosus: Secondary | ICD-10-CM | POA: Diagnosis present

## 2019-10-02 DIAGNOSIS — Z885 Allergy status to narcotic agent status: Secondary | ICD-10-CM

## 2019-10-02 DIAGNOSIS — E872 Acidosis: Secondary | ICD-10-CM | POA: Diagnosis present

## 2019-10-02 DIAGNOSIS — Z0189 Encounter for other specified special examinations: Secondary | ICD-10-CM

## 2019-10-02 DIAGNOSIS — D62 Acute posthemorrhagic anemia: Secondary | ICD-10-CM | POA: Diagnosis present

## 2019-10-02 DIAGNOSIS — I4729 Other ventricular tachycardia: Secondary | ICD-10-CM

## 2019-10-02 DIAGNOSIS — R001 Bradycardia, unspecified: Secondary | ICD-10-CM | POA: Diagnosis not present

## 2019-10-02 DIAGNOSIS — T84028A Dislocation of other internal joint prosthesis, initial encounter: Secondary | ICD-10-CM | POA: Diagnosis present

## 2019-10-02 DIAGNOSIS — Z79899 Other long term (current) drug therapy: Secondary | ICD-10-CM

## 2019-10-02 DIAGNOSIS — Z8261 Family history of arthritis: Secondary | ICD-10-CM

## 2019-10-02 DIAGNOSIS — Z8249 Family history of ischemic heart disease and other diseases of the circulatory system: Secondary | ICD-10-CM

## 2019-10-02 DIAGNOSIS — S43004A Unspecified dislocation of right shoulder joint, initial encounter: Secondary | ICD-10-CM | POA: Diagnosis present

## 2019-10-02 DIAGNOSIS — Z96611 Presence of right artificial shoulder joint: Secondary | ICD-10-CM | POA: Diagnosis present

## 2019-10-02 DIAGNOSIS — E860 Dehydration: Secondary | ICD-10-CM | POA: Diagnosis present

## 2019-10-02 DIAGNOSIS — W1830XA Fall on same level, unspecified, initial encounter: Secondary | ICD-10-CM | POA: Diagnosis present

## 2019-10-02 DIAGNOSIS — E871 Hypo-osmolality and hyponatremia: Secondary | ICD-10-CM | POA: Diagnosis present

## 2019-10-02 LAB — COMPREHENSIVE METABOLIC PANEL
ALT: 22 U/L (ref 0–44)
AST: 78 U/L — ABNORMAL HIGH (ref 15–41)
Albumin: 2.6 g/dL — ABNORMAL LOW (ref 3.5–5.0)
Alkaline Phosphatase: 66 U/L (ref 38–126)
Anion gap: 11 (ref 5–15)
BUN: 15 mg/dL (ref 8–23)
CO2: 14 mmol/L — ABNORMAL LOW (ref 22–32)
Calcium: 7.1 mg/dL — ABNORMAL LOW (ref 8.9–10.3)
Chloride: 106 mmol/L (ref 98–111)
Creatinine, Ser: 1.37 mg/dL — ABNORMAL HIGH (ref 0.44–1.00)
GFR calc Af Amer: 46 mL/min — ABNORMAL LOW (ref >60)
GFR calc non Af Amer: 40 mL/min — ABNORMAL LOW (ref >60)
Glucose, Bld: 167 mg/dL — ABNORMAL HIGH (ref 70–99)
Potassium: 2.2 mmol/L — CL (ref 3.5–5.1)
Sodium: 131 mmol/L — ABNORMAL LOW (ref 135–145)
Total Bilirubin: 0.7 mg/dL (ref 0.3–1.2)
Total Protein: 5.4 g/dL — ABNORMAL LOW (ref 6.5–8.1)

## 2019-10-02 LAB — CBC WITH DIFFERENTIAL/PLATELET
Abs Immature Granulocytes: 0.17 10*3/uL — ABNORMAL HIGH (ref 0.00–0.07)
Basophils Absolute: 0.1 10*3/uL (ref 0.0–0.1)
Basophils Relative: 1 %
Eosinophils Absolute: 0.1 10*3/uL (ref 0.0–0.5)
Eosinophils Relative: 1 %
HCT: 29.5 % — ABNORMAL LOW (ref 36.0–46.0)
Hemoglobin: 8.9 g/dL — ABNORMAL LOW (ref 12.0–15.0)
Immature Granulocytes: 1 %
Lymphocytes Relative: 5 %
Lymphs Abs: 0.7 10*3/uL (ref 0.7–4.0)
MCH: 22.6 pg — ABNORMAL LOW (ref 26.0–34.0)
MCHC: 30.2 g/dL (ref 30.0–36.0)
MCV: 74.9 fL — ABNORMAL LOW (ref 80.0–100.0)
Monocytes Absolute: 0.3 10*3/uL (ref 0.1–1.0)
Monocytes Relative: 2 %
Neutro Abs: 12.6 10*3/uL — ABNORMAL HIGH (ref 1.7–7.7)
Neutrophils Relative %: 90 %
Platelets: 402 10*3/uL — ABNORMAL HIGH (ref 150–400)
RBC: 3.94 MIL/uL (ref 3.87–5.11)
RDW: 17.2 % — ABNORMAL HIGH (ref 11.5–15.5)
WBC: 14 10*3/uL — ABNORMAL HIGH (ref 4.0–10.5)
nRBC: 0 % (ref 0.0–0.2)

## 2019-10-02 LAB — LIPASE, BLOOD: Lipase: 145 U/L — ABNORMAL HIGH (ref 11–51)

## 2019-10-02 LAB — TYPE AND SCREEN
ABO/RH(D): O POS
Antibody Screen: NEGATIVE

## 2019-10-02 LAB — MAGNESIUM: Magnesium: 1.7 mg/dL (ref 1.7–2.4)

## 2019-10-02 LAB — LACTIC ACID, PLASMA: Lactic Acid, Venous: 4.4 mmol/L (ref 0.5–1.9)

## 2019-10-02 LAB — TROPONIN I (HIGH SENSITIVITY): Troponin I (High Sensitivity): 76 ng/L — ABNORMAL HIGH (ref <18)

## 2019-10-02 MED ORDER — ONDANSETRON HCL 4 MG/2ML IJ SOLN
INTRAMUSCULAR | Status: AC
  Filled 2019-10-02: qty 2

## 2019-10-02 MED ORDER — POTASSIUM CHLORIDE 10 MEQ/100ML IV SOLN
10.0000 meq | INTRAVENOUS | Status: AC
  Administered 2019-10-02: 10 meq via INTRAVENOUS
  Filled 2019-10-02 (×2): qty 100

## 2019-10-02 MED ORDER — SODIUM CHLORIDE 0.9 % IV BOLUS
1000.0000 mL | Freq: Once | INTRAVENOUS | Status: AC
  Administered 2019-10-02: 1000 mL via INTRAVENOUS

## 2019-10-02 MED ORDER — SODIUM CHLORIDE 0.9 % IV SOLN
2.0000 g | Freq: Once | INTRAVENOUS | Status: AC
  Administered 2019-10-02: 2 g via INTRAVENOUS

## 2019-10-02 MED ORDER — IOHEXOL 300 MG/ML  SOLN
75.0000 mL | Freq: Once | INTRAMUSCULAR | Status: AC | PRN
  Administered 2019-10-02: 75 mL via INTRAVENOUS
  Filled 2019-10-02: qty 75

## 2019-10-02 MED ORDER — ONDANSETRON HCL 4 MG/2ML IJ SOLN
4.0000 mg | Freq: Once | INTRAMUSCULAR | Status: AC
  Administered 2019-10-02: 4 mg via INTRAVENOUS

## 2019-10-02 MED ORDER — VANCOMYCIN HCL IN DEXTROSE 1-5 GM/200ML-% IV SOLN
1000.0000 mg | Freq: Once | INTRAVENOUS | Status: AC
  Administered 2019-10-02: 1000 mg via INTRAVENOUS
  Filled 2019-10-02: qty 200

## 2019-10-02 NOTE — ED Notes (Signed)
Pt cleansed of vomit, sling pt has is covered in vomit and removed. Pillow support under right arm at this time.

## 2019-10-02 NOTE — ED Provider Notes (Signed)
[facility] Emergency Department Provider Note   ____________________________________________   First MD Initiated Contact with Patient 10/02/19 2143     (approximate)  I have reviewed the triage vital signs and the nursing notes.   HISTORY  Chief Complaint Hypotension  EM caveat: Severe hypotension, acuity of condition limit ability to obtain some elements of history and review of systems  HPI Sydney Evans is a 69 y.o. female nursing notified me that she is presented with severe abdominal pain and vomiting and a blood pressure of 50 systolic.  Nursing advises that patient looks critically ill   Saw and evaluated the patient, she appears very fatigued and weak, she reports that she had shoulder surgery 2 days ago and has been having vomiting no bowel movements and severe increasing abdominal pain since then, much worse over the last 12 hours vomiting and numerous times.  No black or bloody.  No bowel movement for about 3 days  She denies fever.  Shoulder minimal discomfort.  None no chest pain or shortness of breath.   Past Medical History:  Diagnosis Date  . Anemia   . Anxiety   . Arthritis   . Barrett's esophagus   . Bipolar disorder ([place])   . 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 Blood clot in vein   . Cancer ([place])    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  . Carotid arterial disease ([place])   . Contracture of joint of upper arm   . Depression   . Elevated lipids   . GERD (gastroesophageal reflux disease)   . Hypertension   . Migraine   . Osteoporosis   . Poor balance   . Sinus congestion   . 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 TIA (transient ischemic attack)   . Vertigo     Patient Active Problem List   Diagnosis Date Noted  . Ileus ([place]) 10/03/2019  . 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 Post-concussion headache 01/20/2018  . Iron deficiency anemia 10/05/2017  . Carotid stenosis 11/17/2016  . GERD (gastroesophageal reflux disease) 11/17/2016   . Essential hypertension 11/17/2016  . Hyperlipidemia 11/17/2016  . 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 Rotator cuff tendinitis, left 05/20/2016  . History of TIAs 07/17/2015  . 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 TIA (transient ischemic attack) 12/23/2014  . 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 Disorder of bone and articular cartilage 09/29/2014    Past Surgical History:  Procedure Laterality Date  . ABDOMINAL HYSTERECTOMY    . BACK SURGERY    . BREAST BIOPSY Left   . CARPAL TUNNEL RELEASE Bilateral   . CATARACT EXTRACTION W/ INTRAOCULAR LENS  IMPLANT, BILATERAL Bilateral   . CHOLECYSTECTOMY    . COLONOSCOPY WITH PROPOFOL N/A 04/09/2018   Procedure: COLONOSCOPY WITH PROPOFOL;  Surgeon: Jonathon Bellows, MD;  Location: [facility] ENDOSCOPY;  Service: Gastroenterology;  Laterality: N/A;  . crystal cyst removed on left foot    . ESOPHAGOGASTRODUODENOSCOPY (EGD) WITH PROPOFOL N/A 04/09/2018   Procedure: ESOPHAGOGASTRODUODENOSCOPY (EGD) WITH PROPOFOL;  Surgeon: Jonathon Bellows, MD;  Location[facility] ENDOSCOPY;  Service: Gastroenterology;  Laterality: N/A;  . EYE SURGERY    . femoral fx    . FOOT SURGERY    . GIVENS CAPSULE STUDY N/A 06/16/2018   Procedure: GIVENS CAPSULE STUDY;  Surgeon: Jonathon Bellows, MD;  Location: [facility] ENDOSCOPY;  Service: Gastroenterology;  Laterality: N/A;  . JOINT REPLACEMENT    .  KNEE SURGERY    . TONSILLECTOMY    . TOTAL KNEE ARTHROPLASTY Bilateral   . TOTAL SHOULDER ARTHROPLASTY Left 08/01/2016   Procedure: TOTAL SHOULDER ARTHROPLASTY;  Surgeon: Corky Mull, MD;  Location: [facility] ORS;  Service: Orthopedics;  Laterality: Left;  . TOTAL SHOULDER ARTHROPLASTY Right 09/30/2019   Procedure: TOTAL SHOULDER ARTHROPLASTY;  Surgeon: Corky Mull, MD;  Location: [facility] ORS;  Service: Orthopedics;  Laterality: Right;    Prior to Admission medications   Medication Sig Start Date End Date Taking? Authorizing Provider   aspirin EC 325 MG tablet Take 1 tablet (325 mg total) by mouth daily. 09/30/19   Lattie Corns, PA-C  Carboxymethylcellulose Sodium (LUBRICANT EYE DROPS OP) Place 1 drop into both eyes daily as needed (Dry eye). Systane    [provider]  citalopram (CELEXA) 40 MG tablet Take 40 mg by mouth daily.    [provider]  diclofenac Sodium (VOLTAREN) 1 % GEL Apply 1 application topically daily as needed. 09/13/19   [provider]  diphenhydrAMINE (BENADRYL) 25 MG tablet Take 25 mg by mouth every 6 (six) hours as needed for itching.    [provider]  fluticasone (FLONASE) 50 MCG/ACT nasal spray Place 2 sprays into both nostrils daily as needed for allergies or rhinitis.    [provider]  hydrochlorothiazide (HYDRODIURIL) 25 MG tablet Take 25 mg by mouth daily. 01/16/18   [provider]  IBU 800 MG tablet Take 800 mg by mouth daily as needed for mild pain or moderate pain.  09/25/17   [provider]  lamoTRIgine (LAMICTAL) 200 MG tablet Take 200 mg by mouth daily.    [provider]  lidocaine (LIDODERM) 5 % Place 1 patch onto the skin daily as needed (arthritis).  09/13/19   [provider]  lisinopril (PRINIVIL,ZESTRIL) 40 MG tablet Take 40 mg by mouth daily.    [provider]  omeprazole (PRILOSEC) 40 MG capsule Take 40 mg by mouth daily.    [provider]  oxyCODONE (OXY IR/ROXICODONE) 5 MG immediate release tablet Take 1-2 tablets (5-10 mg total) by mouth every 4 (four) hours as needed for moderate pain (pain score 4-6). 09/30/19   Lattie Corns, PA-C  tiZANidine (ZANAFLEX) 2 MG tablet Take 2 mg by mouth 2 (two) times daily as needed for muscle spasms.  09/10/19   [provider]  traMADol (ULTRAM) 50 MG tablet Take 1 tablet (50 mg total) by mouth every 6 (six) hours. 09/30/19   Lattie Corns, PA-C  Travoprost, BAK Free, (TRAVATAN) 0.004 % SOLN ophthalmic solution Place 1 drop  into both eyes at bedtime. 08/10/19   [provider]    Allergies Hydroxyzine, Gabapentin, Hydrocodone, Ketorolac, Percocet [oxycodone-acetaminophen], and Toradol [ketorolac tromethamine]  Family History  Problem Relation Age of Onset  . Other Mother        ?lupus   . Cancer Mother   . High Cholesterol Mother   . CAD Father        CABG  . High Cholesterol Father   . Arthritis Father   . Cancer Sister   . Heart murmur Sister   . Heart murmur Brother   . High blood pressure Other        "for everybody"  . High Cholesterol Other        "for everybody"    Social History Social History   Tobacco Use  . Smoking status: Current Every Day Smoker  Packs/day: 0.50    Types: Cigarettes    Last attempt to quit: 08/04/2016    Years since quitting: 3.1  . Smokeless tobacco: Never Used  Substance Use Topics  . Alcohol use: No  . Drug use: No    Review of Systems  See HPI EM caveat   ____________________________________________   PHYSICAL EXAM:  VITAL SIGNS: ED Triage Vitals  Enc Vitals Group     BP 10/02/19 2114 (!) 52/27     Pulse Rate 10/02/19 2114 (!) 50     Resp 10/02/19 2114 (!) 24     Temp 10/02/19 2114 97.7 F (36.5 C)     Temp src --      SpO2 10/02/19 2114 100 %     Weight --      Height --      Head Circumference --      Peak Flow --      Pain Score 10/02/19 2118 10     Pain Loc --      Pain Edu? --      Excl. in [place]? --     Constitutional: Alert and oriented.  Appears very pale, critically ill in appearance.  Has an appearance almost as though she is in a periarrest state but she is alert oriented and respirating Eyes: Conjunctivae are normal. Head: Atraumatic. Nose: No congestion/rhinnorhea. Mouth/Throat: Mucous membranes are truly dry, having occasional active vomiting of what appear to be gastric contents that do not appear bloody or black nonbilious. Neck: No stridor.  Cardiovascular: Normal rate, regular rhythm. Grossly normal heart  sounds.  Good peripheral circulation. Respiratory: Normal respiratory effort.  No retractions. Lungs CTAB. Gastrointestinal: Soft and nondistended but diffusely tender throughout. musculoskeletal: No lower extremity tenderness nor edema. Neurologic:  Normal speech and language. No gross focal neurologic deficits are appreciated.  Skin:  Skin is warm, diaphoretic and intact. No rash noted. Psychiatric: Mood and affect are anxious  ____________________________________________   LABS (all labs ordered are listed, but only abnormal results are displayed)  Labs Reviewed  CBC WITH DIFFERENTIAL/PLATELET - Abnormal; Notable for the following components:      Result Value   WBC 14.0 (*)    Hemoglobin 8.9 (*)    HCT 29.5 (*)    MCV 74.9 (*)    MCH 22.6 (*)    RDW 17.2 (*)    Platelets 402 (*)    Neutro Abs 12.6 (*)    Abs Immature Granulocytes 0.17 (*)    All other components within normal limits  COMPREHENSIVE METABOLIC PANEL - Abnormal; Notable for the following components:   Sodium 131 (*)    Potassium 2.2 (*)    CO2 14 (*)    Glucose, Bld 167 (*)    Creatinine, Ser 1.37 (*)    Calcium 7.1 (*)    Total Protein 5.4 (*)    Albumin 2.6 (*)    AST 78 (*)    GFR calc non Af Amer 40 (*)    GFR calc Af Amer 46 (*)    All other components within normal limits  LACTIC ACID, PLASMA - Abnormal; Notable for the following components:   Lactic Acid, Venous 4.4 (*)    All other components within normal limits  LIPASE, BLOOD - Abnormal; Notable for the following components:   Lipase 145 (*)    All other components within normal limits  TROPONIN I (HIGH SENSITIVITY) - Abnormal; Notable for the following components:   Troponin I (High Sensitivity) 76 (*)  All other components within normal limits  SARS CORONAVIRUS 2 BY RT PCR (HOSPITAL ORDER, [facility])  CULTURE, BLOOD (ROUTINE X 2)  CULTURE, BLOOD (ROUTINE X 2)  MAGNESIUM  URINALYSIS, COMPLETE (UACMP) WITH  MICROSCOPIC  LACTIC ACID, PLASMA  HIV ANTIBODY (ROUTINE TESTING W REFLEX)  BASIC METABOLIC PANEL  CBC  TYPE AND SCREEN  TROPONIN I (HIGH SENSITIVITY)   ____________________________________________  EKG  Reviewed inter by me at 2130 Heart rate 75 QRS 70 QTc 450 Normal sinus rhythm, some nonspecific T wave abnormality.  No STEMI ____________________________________________  RADIOLOGY  DG Chest 1 View  Result Date: 10/02/2019 CLINICAL DATA:  Altered mental status. Vomiting. Right shoulder surgery 2 days ago. EXAM: CHEST  1 VIEW COMPARISON:  Chest radiograph 07/01/2019. Shoulder radiograph 09/30/2019 FINDINGS: Low lung volumes. Unchanged heart size and mediastinal contours. Streaky atelectasis in the lung bases. No confluent airspace disease. No pleural fluid or pneumothorax. Right shoulder arthroplasty, alignment not well assessed on this supine chest radiograph, however the humeral component is not definitively aligned with the glenoid. Right shoulder skin staples are noted. IMPRESSION: 1. Low lung volumes with streaky atelectasis in the lung bases. 2. Recent right shoulder arthroplasty. Glenohumeral alignment is not well assessed on this supine chest radiograph, however the humeral arthroplasty is not definitively aligned with the glenoid. Recommend dedicated shoulder radiograph for further evaluation. Electronically Signed   By: Keith Rake M.D.   On: 10/02/2019 22:11   CT ABDOMEN PELVIS W CONTRAST  Result Date: 10/02/2019 CLINICAL DATA:  Abdominal pain and vomiting. Right shoulder surgery 2 days ago. EXAM: CT ABDOMEN AND PELVIS WITH CONTRAST TECHNIQUE: Multidetector CT imaging of the abdomen and pelvis was performed using the standard protocol following bolus administration of intravenous contrast. CONTRAST:  54mL OMNIPAQUE IOHEXOL 300 MG/ML  SOLN COMPARISON:  Radiograph earlier this day. Abdominal CT 02/27/2018, chest abdomen pelvis CTA 07/01/2019 FINDINGS: Lower chest: Lung bases  are clear. Hepatobiliary: Post cholecystectomy. There is intra and extrahepatic biliary ductal dilatation. The common bile duct measures approximately 13 mm at the porta hepatis. No obvious choledocholithiasis. No focal hepatic lesion. Pancreas: No ductal dilatation or inflammation. Spleen: Normal in size without focal abnormality. Adrenals/Urinary Tract: Mild left adrenal thickening. No evidence of adrenal nodule. No hydronephrosis or perinephric edema. Homogeneous renal enhancement. There is absent excretion from both kidneys on delayed phase imaging. Urinary bladder is physiologically distended without wall thickening. Stomach/Bowel: Fluid within the distal esophagus. Fluid-filled stomach. Mild Peri pyloric gastric wall thickening. Normal positioning of the ligament of Treitz. Fluid-filled small bowel that is not abnormally dilated. Small amount of mesenteric edema in the right lower quadrant. Air and liquid stool within the cecum. The ascending, transverse, and descending colon are dilated and fluid-filled. Gradual transition to less dilated sigmoid colon with liquid and solid stool. There is solid stool within the distal sigmoid colon. No colonic wall thickening or inflammation. There is no bowel pneumatosis. Vascular/Lymphatic: Aorto bi-iliac atherosclerosis. No aortic aneurysm. The portal vein is patent. No adenopathy. Reproductive: Uterus not seen, presumably surgically absent. Ovaries are quiescent. Other: Trace free fluid in the pelvis.  No free intra-abdominal air. 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 Facet hypertrophy throughout the lumbar spine. Kyphoplasty within lower thoracic vertebral compression fractures. There are no acute or suspicious osseous abnormalities. IMPRESSION: 1. Findings consistent with generalized ileus and slow transit. Fluid/liquid stool throughout the majority of the colon with gradual transition to formed stool in the sigmoid.  Fluid-filled small bowel  and stomach. No transition point or obstruction. There is minimal mesenteric edema but no definite wall thickening or bowel inflammation. 2. Intra and extrahepatic biliary ductal dilatation postcholecystectomy, progressed from February 2021 exam. Recommend correlation with LFTs. If LFTs are normal, no further imaging follow-up is needed. If LFTs are elevated, recommend further evaluation with ERCP or MRCP, MRCP should only be considered if patient is able to tolerate breath hold technique. 3. Absent excretion from both kidneys on delayed phase imaging suggest underlying renal dysfunction. Aortic Atherosclerosis (ICD10-I70.0). Electronically Signed   By: Keith Rake M.D.   On: 10/02/2019 23:38   DG Abd Portable 1 View  Result Date: 10/02/2019 CLINICAL DATA:  Hypotension and vomiting EXAM: PORTABLE ABDOMEN - 1 VIEW COMPARISON:  None. FINDINGS: There is a mildly dilated air-filled loop of probable cecum seen within the right lower quadrant. There is air seen down to the level of the rectum. IMPRESSION: Mildly dilated air-filled loop of bowel in the right lower quadrant, likely cecum which could be due to ileus versus partial bowel obstruction. Electronically Signed   By: Prudencio Pair M.D.   On: 10/02/2019 22:35     Imaging studies including CT abdomen pelvis discussed with Dr. Joelyn Oms of radiology.  Also discussed with Dr. Hampton Abbot.  Both radiology and surgery seem to agree there is no evidence of an acute operative bowel obstruction or evidence of acute mesenteric ischemia, both indicating it appears most consistent with a significant ileus.  Discussed chest x-ray findings (SHOULDER) with Dr. Marry Guan, advises Ortho will see patient in consult tomorrow ____________________________________________   PROCEDURES  Procedure(s) performed: Angiocatheter insertion by me  Procedures  Critical Care performed: Yes, see critical care note(s)  CRITICAL CARE Performed by: Delman Kitten   Total critical care time: 65 minutes  Critical care time was exclusive of separately billable procedures and treating other patients.  Critical care was necessary to treat or prevent imminent or life-threatening deterioration.  Critical care was time spent personally by me on the following activities: development of treatment plan with patient and/or surrogate as well as nursing, discussions with consultants, evaluation of patient's response to treatment, examination of patient, obtaining history from patient or surrogate, ordering and performing treatments and interventions, ordering and review of laboratory studies, ordering and review of radiographic studies, pulse oximetry and re-evaluation of patient's condition.  ____________________________________________   INITIAL IMPRESSION / ASSESSMENT AND PLAN / ED COURSE  Pertinent labs & imaging results that were available during my care of the patient were reviewed by me and considered in my medical decision making (see chart for details).   Severe hypotension in the setting of severe abdominal pain and vomiting.  No cardiac or pulmonary symptoms.  Right shoulder appears to be healing without evidence of acute infection overlying surgical area, orthopedics to consult on tomorrow  Minimal nonspecific T wave abnormality no chest pain does not appear to be acutely cardiac in nature.  Rather appears to have an acute abdominal process with severe nausea and vomiting I suspect is indicative of an ileus, but have also placed consultation with general surgery discussed findings with Dr. Hampton Abbot  Clinical Course as of Oct 03 51  Sat Oct 02, 2019  2144 Angiocath insertion Performed by: Delman Kitten  Consent: Verbal consent obtained.  Emergent situation with severe hypotension and lack of IV access Risks and benefits: risks, benefits   Time out: verify the correct patient, procedure, equipment, support staff and site/side marked as  required.  Preparation: Patient was prepped  and draped in the usual sterile fashion.  Vein Location: Left antecubital  Ultrasound Guided  Gauge: 20  Normal blood return and flush without difficulty Patient tolerance: Patient tolerated the procedure well with no immediate complications.      [MQ]  2240 Patient with elevated lactic acid, ongoing vomiting.  She reports she very poorly tolerates narcotic pain medicines.  At this point, discussed with the patient we will proceed with attempt at CT imaging to further characterize abdominal pathology.  I suspect possible bowel obstruction, consider potential for ischemia, colitis, also infection is on her differential but she lacks fever or preceding obvious infectious symptoms based on her history, though recent hospital stay and surgery increases this risk. Will start CODE sepsis and cover empirically   [MQ]  2356 Discussed with Dr. Hampton Abbot, advises aggressive hydration and decompression.    [MQ]    Clinical Course User Index [MQ] Delman Kitten, MD    ----------------------------------------- 12:50 AM on 10/03/2019 -----------------------------------------  Vitals:   10/02/19 2230 10/02/19 2330  BP: (!) 93/54 (!) 102/57  Pulse:    Resp: (!) 24 (!) 27  Temp:    SpO2:      ED Sepsis - Repeat Assessment   Performed at:    12:15 AM  Last Vitals:    Blood pressure (!) 102/57, pulse (!) 50, temperature 97.7 F (36.5 C), resp. rate (!) 27, SpO2 98 %.  Heart:      Heart rate 50  Lungs:     Clear  Capillary Refill:   Normal  Peripheral Pulse (include location): Right radial   Skin (include color):   Warm, appears much improved  Patient appearing much improved from the time presentation. ____________________________________________  [name] was evaluated in Emergency Department on 10/03/2019 for the symptoms described in the history of present illness. She was evaluated in the context of the global COVID-19 pandemic,  which necessitated consideration that the patient might be at risk for infection with the SARS-CoV-2 virus that causes COVID-19. Institutional protocols and algorithms that pertain to the evaluation of patients at risk for COVID-19 are in a state of rapid change based on information released by regulatory bodies including the CDC and federal and state organizations. These policies and algorithms were followed during the patient's care in the ED.  Admission discussed with Dr. Sidney Ace accepting the hospitalist service  FINAL CLINICAL IMPRESSION(S) / ED DIAGNOSES  Final diagnoses:  Ileus ([place])  Generalized abdominal pain  Shock ([place])  Hypokalemia        Note:  This document was prepared using Dragon voice recognition software and may include unintentional dictation errors       Delman Kitten, MD 10/03/19 [phone]

## 2019-10-02 NOTE — ED Notes (Signed)
Pt cleansed of more vomit, urine. Warm blankets applied. Pt has removed iv to hand, catheter intact.

## 2019-10-02 NOTE — ED Notes (Signed)
Dr. Jacqualine Code notified of critical potassium of 2.2 called from lab. No new verbal orders received.

## 2019-10-02 NOTE — ED Notes (Signed)
Pt to ct. Will hang potassium when pt returns. Only one line at this time and cefepime and potassium are not compatible in liner per pharmacy.

## 2019-10-02 NOTE — ED Notes (Signed)
Pt returned from ct

## 2019-10-02 NOTE — ED Triage Notes (Signed)
Patient with right shoulder surgery today. C/o pain, actively vomiting, BP 52/27 in triage

## 2019-10-02 NOTE — ED Notes (Signed)
Pt urinating in bed.

## 2019-10-02 NOTE — ED Notes (Signed)
Critical lactic acid of 4.4 called from lab. Dr. Jacqualine Code notified, no new verbal orders received.

## 2019-10-03 ENCOUNTER — Inpatient Hospital Stay: Payer: Medicare Other

## 2019-10-03 ENCOUNTER — Encounter: Payer: Self-pay | Admitting: Family Medicine

## 2019-10-03 DIAGNOSIS — Z20822 Contact with and (suspected) exposure to covid-19: Secondary | ICD-10-CM | POA: Diagnosis present

## 2019-10-03 DIAGNOSIS — D62 Acute posthemorrhagic anemia: Secondary | ICD-10-CM | POA: Diagnosis present

## 2019-10-03 DIAGNOSIS — E871 Hypo-osmolality and hyponatremia: Secondary | ICD-10-CM | POA: Diagnosis present

## 2019-10-03 DIAGNOSIS — F17210 Nicotine dependence, cigarettes, uncomplicated: Secondary | ICD-10-CM | POA: Diagnosis present

## 2019-10-03 DIAGNOSIS — E876 Hypokalemia: Secondary | ICD-10-CM | POA: Diagnosis not present

## 2019-10-03 DIAGNOSIS — I10 Essential (primary) hypertension: Secondary | ICD-10-CM | POA: Diagnosis present

## 2019-10-03 DIAGNOSIS — R001 Bradycardia, unspecified: Secondary | ICD-10-CM | POA: Diagnosis not present

## 2019-10-03 DIAGNOSIS — E785 Hyperlipidemia, unspecified: Secondary | ICD-10-CM | POA: Diagnosis present

## 2019-10-03 DIAGNOSIS — I951 Orthostatic hypotension: Secondary | ICD-10-CM | POA: Diagnosis not present

## 2019-10-03 DIAGNOSIS — I959 Hypotension, unspecified: Secondary | ICD-10-CM | POA: Diagnosis present

## 2019-10-03 DIAGNOSIS — R1084 Generalized abdominal pain: Secondary | ICD-10-CM

## 2019-10-03 DIAGNOSIS — R7989 Other specified abnormal findings of blood chemistry: Secondary | ICD-10-CM

## 2019-10-03 DIAGNOSIS — K567 Ileus, unspecified: Secondary | ICD-10-CM | POA: Diagnosis present

## 2019-10-03 DIAGNOSIS — I472 Ventricular tachycardia: Secondary | ICD-10-CM | POA: Diagnosis not present

## 2019-10-03 DIAGNOSIS — K9189 Other postprocedural complications and disorders of digestive system: Secondary | ICD-10-CM | POA: Diagnosis present

## 2019-10-03 DIAGNOSIS — F319 Bipolar disorder, unspecified: Secondary | ICD-10-CM | POA: Diagnosis present

## 2019-10-03 DIAGNOSIS — R579 Shock, unspecified: Secondary | ICD-10-CM | POA: Diagnosis present

## 2019-10-03 DIAGNOSIS — E872 Acidosis: Secondary | ICD-10-CM | POA: Diagnosis present

## 2019-10-03 DIAGNOSIS — K219 Gastro-esophageal reflux disease without esophagitis: Secondary | ICD-10-CM | POA: Diagnosis present

## 2019-10-03 DIAGNOSIS — I9589 Other hypotension: Secondary | ICD-10-CM | POA: Diagnosis not present

## 2019-10-03 DIAGNOSIS — T84028A Dislocation of other internal joint prosthesis, initial encounter: Secondary | ICD-10-CM | POA: Diagnosis present

## 2019-10-03 DIAGNOSIS — N179 Acute kidney failure, unspecified: Secondary | ICD-10-CM | POA: Diagnosis present

## 2019-10-03 DIAGNOSIS — G43909 Migraine, unspecified, not intractable, without status migrainosus: Secondary | ICD-10-CM | POA: Diagnosis present

## 2019-10-03 DIAGNOSIS — Z66 Do not resuscitate: Secondary | ICD-10-CM | POA: Diagnosis not present

## 2019-10-03 DIAGNOSIS — W1830XA Fall on same level, unspecified, initial encounter: Secondary | ICD-10-CM | POA: Diagnosis present

## 2019-10-03 DIAGNOSIS — M810 Age-related osteoporosis without current pathological fracture: Secondary | ICD-10-CM | POA: Diagnosis present

## 2019-10-03 DIAGNOSIS — D638 Anemia in other chronic diseases classified elsewhere: Secondary | ICD-10-CM | POA: Diagnosis present

## 2019-10-03 DIAGNOSIS — Y838 Other surgical procedures as the cause of abnormal reaction of the patient, or of later complication, without mention of misadventure at the time of the procedure: Secondary | ICD-10-CM | POA: Diagnosis present

## 2019-10-03 DIAGNOSIS — Z8261 Family history of arthritis: Secondary | ICD-10-CM | POA: Diagnosis not present

## 2019-10-03 DIAGNOSIS — M1990 Unspecified osteoarthritis, unspecified site: Secondary | ICD-10-CM | POA: Diagnosis present

## 2019-10-03 DIAGNOSIS — Y92019 Unspecified place in single-family (private) house as the place of occurrence of the external cause: Secondary | ICD-10-CM | POA: Diagnosis not present

## 2019-10-03 DIAGNOSIS — S46911A Strain of unspecified muscle, fascia and tendon at shoulder and upper arm level, right arm, initial encounter: Secondary | ICD-10-CM | POA: Diagnosis present

## 2019-10-03 LAB — BASIC METABOLIC PANEL
Anion gap: 12 (ref 5–15)
Anion gap: 12 (ref 5–15)
BUN: 21 mg/dL (ref 8–23)
BUN: 25 mg/dL — ABNORMAL HIGH (ref 8–23)
CO2: 17 mmol/L — ABNORMAL LOW (ref 22–32)
CO2: 14 mmol/L — ABNORMAL LOW (ref 22–32)
Calcium: 7.6 mg/dL — ABNORMAL LOW (ref 8.9–10.3)
Calcium: 7.7 mg/dL — ABNORMAL LOW (ref 8.9–10.3)
Chloride: 100 mmol/L (ref 98–111)
Chloride: 105 mmol/L (ref 98–111)
Creatinine, Ser: 1.66 mg/dL — ABNORMAL HIGH (ref 0.44–1.00)
Creatinine, Ser: 1.39 mg/dL — ABNORMAL HIGH (ref 0.44–1.00)
GFR calc Af Amer: 37 mL/min — ABNORMAL LOW (ref >60)
GFR calc Af Amer: 45 mL/min — ABNORMAL LOW (ref >60)
GFR calc non Af Amer: 32 mL/min — ABNORMAL LOW (ref >60)
GFR calc non Af Amer: 39 mL/min — ABNORMAL LOW (ref >60)
Glucose, Bld: 167 mg/dL — ABNORMAL HIGH (ref 70–99)
Glucose, Bld: 143 mg/dL — ABNORMAL HIGH (ref 70–99)
Potassium: 3.5 mmol/L (ref 3.5–5.1)
Potassium: 4.2 mmol/L (ref 3.5–5.1)
Sodium: 129 mmol/L — ABNORMAL LOW (ref 135–145)
Sodium: 131 mmol/L — ABNORMAL LOW (ref 135–145)

## 2019-10-03 LAB — HIV ANTIBODY (ROUTINE TESTING W REFLEX): HIV Screen 4th Generation wRfx: NONREACTIVE

## 2019-10-03 LAB — C DIFFICILE QUICK SCREEN W PCR REFLEX
C Diff antigen: NEGATIVE
C Diff interpretation: NOT DETECTED
C Diff toxin: NEGATIVE

## 2019-10-03 LAB — TROPONIN I (HIGH SENSITIVITY): Troponin I (High Sensitivity): 69 ng/L — ABNORMAL HIGH (ref <18)

## 2019-10-03 LAB — GASTROINTESTINAL PANEL BY PCR, STOOL (REPLACES STOOL CULTURE)

## 2019-10-03 LAB — CBC
HCT: 33.7 % — ABNORMAL LOW (ref 36.0–46.0)
HCT: 35.9 % — ABNORMAL LOW (ref 36.0–46.0)
Hemoglobin: 10 g/dL — ABNORMAL LOW (ref 12.0–15.0)
Hemoglobin: 11.2 g/dL — ABNORMAL LOW (ref 12.0–15.0)
MCH: 22.4 pg — ABNORMAL LOW (ref 26.0–34.0)
MCH: 22.3 pg — ABNORMAL LOW (ref 26.0–34.0)
MCHC: 29.7 g/dL — ABNORMAL LOW (ref 30.0–36.0)
MCHC: 31.2 g/dL (ref 30.0–36.0)
MCV: 75.4 fL — ABNORMAL LOW (ref 80.0–100.0)
MCV: 71.5 fL — ABNORMAL LOW (ref 80.0–100.0)
Platelets: 447 10*3/uL — ABNORMAL HIGH (ref 150–400)
Platelets: 421 10*3/uL — ABNORMAL HIGH (ref 150–400)
RBC: 4.47 MIL/uL (ref 3.87–5.11)
RBC: 5.02 MIL/uL (ref 3.87–5.11)
RDW: 17.5 % — ABNORMAL HIGH (ref 11.5–15.5)
RDW: 17.8 % — ABNORMAL HIGH (ref 11.5–15.5)
WBC: 14.4 10*3/uL — ABNORMAL HIGH (ref 4.0–10.5)
WBC: 14.1 10*3/uL — ABNORMAL HIGH (ref 4.0–10.5)
nRBC: 0 % (ref 0.0–0.2)
nRBC: 0 % (ref 0.0–0.2)

## 2019-10-03 LAB — LACTIC ACID, PLASMA
Lactic Acid, Venous: 4 mmol/L (ref 0.5–1.9)
Lactic Acid, Venous: 4.4 mmol/L (ref 0.5–1.9)
Lactic Acid, Venous: 2.7 mmol/L (ref 0.5–1.9)

## 2019-10-03 LAB — PROTIME-INR
INR: 1.2 (ref 0.8–1.2)
Prothrombin Time: 15 seconds (ref 11.4–15.2)

## 2019-10-03 LAB — SARS CORONAVIRUS 2 BY RT PCR (HOSPITAL ORDER, PERFORMED IN ~~LOC~~ HOSPITAL LAB): SARS Coronavirus 2: NEGATIVE

## 2019-10-03 LAB — MAGNESIUM: Magnesium: 2.4 mg/dL (ref 1.7–2.4)

## 2019-10-03 LAB — APTT: aPTT: 29 seconds (ref 24–36)

## 2019-10-03 LAB — OCCULT BLOOD X 1 CARD TO LAB, STOOL: Fecal Occult Bld: POSITIVE — AB

## 2019-10-03 MED ORDER — PANTOPRAZOLE SODIUM 40 MG PO TBEC
40.0000 mg | DELAYED_RELEASE_TABLET | Freq: Every day | ORAL | Status: DC
  Administered 2019-10-03 – 2019-10-05 (×3): 40 mg via ORAL
  Filled 2019-10-03 (×3): qty 1

## 2019-10-03 MED ORDER — VANCOMYCIN HCL IN DEXTROSE 1-5 GM/200ML-% IV SOLN: 1000.0000 mg | Freq: Once | INTRAVENOUS | Status: DC

## 2019-10-03 MED ORDER — ONDANSETRON HCL 4 MG PO TABS: 4.0000 mg | ORAL_TABLET | Freq: Four times a day (QID) | ORAL | Status: DC | PRN

## 2019-10-03 MED ORDER — MAGNESIUM HYDROXIDE 400 MG/5ML PO SUSP: 30.0000 mL | Freq: Every day | ORAL | Status: DC | PRN

## 2019-10-03 MED ORDER — FENTANYL CITRATE (PF) 100 MCG/2ML IJ SOLN
12.5000 ug | INTRAMUSCULAR | Status: DC | PRN
  Administered 2019-10-03: 12.5 ug via INTRAVENOUS
  Administered 2019-10-06 – 2019-10-07 (×2): 25 ug via INTRAVENOUS
  Filled 2019-10-03 (×3): qty 2

## 2019-10-03 MED ORDER — DIPHENHYDRAMINE HCL 25 MG PO CAPS: 25.0000 mg | ORAL_CAPSULE | Freq: Four times a day (QID) | ORAL | Status: DC | PRN

## 2019-10-03 MED ORDER — LATANOPROST 0.005 % OP SOLN
1.0000 [drp] | Freq: Every day | OPHTHALMIC | Status: DC
  Administered 2019-10-03 – 2019-10-10 (×8): 1 [drp] via OPHTHALMIC
  Filled 2019-10-03: qty 2.5

## 2019-10-03 MED ORDER — ENOXAPARIN SODIUM 40 MG/0.4ML ~~LOC~~ SOLN
40.0000 mg | SUBCUTANEOUS | Status: DC
  Administered 2019-10-03 – 2019-10-04 (×2): 40 mg via SUBCUTANEOUS
  Filled 2019-10-03 (×2): qty 0.4

## 2019-10-03 MED ORDER — POLYVINYL ALCOHOL 1.4 % OP SOLN
Freq: Every day | OPHTHALMIC | Status: DC | PRN
  Filled 2019-10-03: qty 15

## 2019-10-03 MED ORDER — ONDANSETRON HCL 4 MG/2ML IJ SOLN: 4.0000 mg | Freq: Four times a day (QID) | INTRAMUSCULAR | Status: DC | PRN

## 2019-10-03 MED ORDER — TIZANIDINE HCL 2 MG PO TABS
2.0000 mg | ORAL_TABLET | Freq: Two times a day (BID) | ORAL | Status: DC | PRN
  Filled 2019-10-03: qty 1

## 2019-10-03 MED ORDER — SODIUM CHLORIDE 0.9 % IV SOLN: INTRAVENOUS | Status: DC

## 2019-10-03 MED ORDER — FLUTICASONE PROPIONATE 50 MCG/ACT NA SUSP
2.0000 | Freq: Every day | NASAL | Status: DC | PRN
  Filled 2019-10-03: qty 16

## 2019-10-03 MED ORDER — METRONIDAZOLE IN NACL 5-0.79 MG/ML-% IV SOLN
500.0000 mg | Freq: Three times a day (TID) | INTRAVENOUS | Status: DC
  Administered 2019-10-03 – 2019-10-06 (×11): 500 mg via INTRAVENOUS
  Filled 2019-10-03 (×15): qty 100

## 2019-10-03 MED ORDER — POTASSIUM CHLORIDE 10 MEQ/100ML IV SOLN
10.0000 meq | INTRAVENOUS | Status: AC
  Administered 2019-10-03 (×4): 10 meq via INTRAVENOUS
  Filled 2019-10-03: qty 100

## 2019-10-03 MED ORDER — SODIUM CHLORIDE 0.9 % IV SOLN
2.0000 g | Freq: Two times a day (BID) | INTRAVENOUS | Status: DC
  Administered 2019-10-03 – 2019-10-04 (×4): 2 g via INTRAVENOUS
  Filled 2019-10-03 (×6): qty 2

## 2019-10-03 MED ORDER — ACETAMINOPHEN 325 MG PO TABS: 650.0000 mg | ORAL_TABLET | Freq: Four times a day (QID) | ORAL | Status: DC | PRN

## 2019-10-03 MED ORDER — CHLORHEXIDINE GLUCONATE CLOTH 2 % EX PADS
6.0000 | MEDICATED_PAD | Freq: Every day | CUTANEOUS | Status: DC
  Administered 2019-10-03 – 2019-10-10 (×7): 6 via TOPICAL

## 2019-10-03 MED ORDER — SODIUM CHLORIDE 0.9 % IV BOLUS
1000.0000 mL | Freq: Once | INTRAVENOUS | Status: AC
  Administered 2019-10-03: 1000 mL via INTRAVENOUS

## 2019-10-03 MED ORDER — FENTANYL CITRATE (PF) 100 MCG/2ML IJ SOLN: 25.0000 ug | Freq: Once | INTRAMUSCULAR | Status: DC

## 2019-10-03 MED ORDER — CITALOPRAM HYDROBROMIDE 20 MG PO TABS
40.0000 mg | ORAL_TABLET | Freq: Every day | ORAL | Status: DC
  Administered 2019-10-03 – 2019-10-11 (×9): 40 mg via ORAL
  Filled 2019-10-03 (×9): qty 2

## 2019-10-03 MED ORDER — SODIUM CHLORIDE 0.9 % IV SOLN: 2.0000 g | Freq: Once | INTRAVENOUS | Status: DC

## 2019-10-03 MED ORDER — DIPHENHYDRAMINE HCL 50 MG/ML IJ SOLN
12.5000 mg | Freq: Once | INTRAMUSCULAR | Status: AC
  Administered 2019-10-03: 12.5 mg via INTRAVENOUS
  Filled 2019-10-03: qty 1

## 2019-10-03 MED ORDER — FENTANYL CITRATE (PF) 100 MCG/2ML IJ SOLN
25.0000 ug | INTRAMUSCULAR | Status: DC | PRN
  Administered 2019-10-03: 25 ug via INTRAVENOUS
  Filled 2019-10-03: qty 2

## 2019-10-03 MED ORDER — VANCOMYCIN HCL IN DEXTROSE 1-5 GM/200ML-% IV SOLN
1000.0000 mg | INTRAVENOUS | Status: DC
  Administered 2019-10-03: 1000 mg via INTRAVENOUS
  Filled 2019-10-03: qty 200

## 2019-10-03 MED ORDER — POTASSIUM CHLORIDE 20 MEQ PO PACK: 40.0000 meq | PACK | Freq: Once | ORAL | Status: DC

## 2019-10-03 MED ORDER — ACETAMINOPHEN 650 MG RE SUPP: 650.0000 mg | Freq: Four times a day (QID) | RECTAL | Status: DC | PRN

## 2019-10-03 MED ORDER — LAMOTRIGINE 100 MG PO TABS
200.0000 mg | ORAL_TABLET | Freq: Every day | ORAL | Status: DC
  Administered 2019-10-03 – 2019-10-11 (×9): 200 mg via ORAL
  Filled 2019-10-03 (×9): qty 2

## 2019-10-03 MED ORDER — TRAZODONE HCL 50 MG PO TABS: 50.0000 mg | ORAL_TABLET | Freq: Every evening | ORAL | Status: DC | PRN

## 2019-10-03 MED ORDER — MAGNESIUM SULFATE 2 GM/50ML IV SOLN
2.0000 g | Freq: Once | INTRAVENOUS | Status: AC
  Administered 2019-10-03: 2 g via INTRAVENOUS
  Filled 2019-10-03: qty 50

## 2019-10-03 NOTE — ED Notes (Signed)
Waiting on iv team to complete mid line insertion due to sterile field, then will collect troponin and lactic. At this time only allowed per pt to access left arm and left arm is covered in sterile field.

## 2019-10-03 NOTE — ED Notes (Signed)
Pt with large liquid brown stool with clumps dripping off strecher onto floor. Pt cleansed, new sheets applied.

## 2019-10-03 NOTE — Progress Notes (Signed)
Po medications given. NG tube clamped. Will returned to low intermittent suction in one hour. Will continue to monitor

## 2019-10-03 NOTE — Consult Note (Signed)
ORTHOPAEDIC CONSULTATION  PATIENT NAME: Sydney Evans DOB: 03-Oct-1950  MRN: [id number]  REQUESTING PHYSICIAN: Oren Binet*  Chief Complaint: Right shoulder pain  HPI: Sydney Evans is a 69 y.o. female who underwent right total shoulder arthroplasty 3 days ago as per Dr. Elenore Rota.  She had apparently been maintained in her shoulder sling and was having only minimal shoulder pain.  She was admitted last night with complaints of nausea, vomiting, and increasing abdominal pain.  She was noted to be hypotensive with episodes of bradycardia.  She was subsequently admitted for an ileus and treatment of the hypotension.  Initial chest x-ray demonstrated findings concerning for the right shoulder and subsequent dedicated shoulder films demonstrated an anterior dislocation of the right total shoulder arthroplasty.  Past Medical History:  Diagnosis Date  . Anemia   . Anxiety   . Arthritis   . Barrett's esophagus   . Bipolar disorder ([place])   . 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 Blood clot in vein   . Cancer ([place])    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  . Carotid arterial disease ([place])   . Contracture of joint of upper arm   . Depression   . Elevated lipids   . GERD (gastroesophageal reflux disease)   . Hypertension   . Migraine   . Osteoporosis   . Poor balance   . Sinus congestion   . Stroke ([place])    x 2  . TIA (transient ischemic attack)   . Vertigo    Past Surgical History:  Procedure Laterality Date  . ABDOMINAL HYSTERECTOMY    . BACK SURGERY    . BREAST BIOPSY Left   . CARPAL TUNNEL RELEASE Bilateral   . CATARACT EXTRACTION W/ INTRAOCULAR LENS  IMPLANT, BILATERAL Bilateral   . CHOLECYSTECTOMY    . COLONOSCOPY WITH PROPOFOL N/A 04/09/2018   Procedure: COLONOSCOPY WITH PROPOFOL;  Surgeon: Jonathon Bellows, MD;  Location: [facility] ENDOSCOPY;  Service: Gastroenterology;  Laterality: N/A;  . crystal cyst removed on left foot    . ESOPHAGOGASTRODUODENOSCOPY (EGD) WITH PROPOFOL N/A 04/09/2018    Procedure: ESOPHAGOGASTRODUODENOSCOPY (EGD) WITH PROPOFOL;  Surgeon: Jonathon Bellows, MD;  Location: [facility] ENDOSCOPY;  Service: Gastroenterology;  Laterality: N/A;  . EYE SURGERY    . femoral fx    . FOOT SURGERY    . GIVENS CAPSULE STUDY N/A 06/16/2018   Procedure: GIVENS CAPSULE STUDY;  Surgeon: Jonathon Bellows, MD;  Location: [facility] ENDOSCOPY;  Service: Gastroenterology;  Laterality: N/A;  . JOINT REPLACEMENT    . KNEE SURGERY    . TONSILLECTOMY    . TOTAL KNEE ARTHROPLASTY Bilateral   . TOTAL SHOULDER ARTHROPLASTY Left 08/01/2016   Procedure: TOTAL SHOULDER ARTHROPLASTY;  Surgeon: Corky Mull, MD;  Location: [facility] ORS;  Service: Orthopedics;  Laterality: Left;  . TOTAL SHOULDER ARTHROPLASTY Right 09/30/2019   Procedure: TOTAL SHOULDER ARTHROPLASTY;  Surgeon: Corky Mull, MD;  Location: [facility] ORS;  Service: Orthopedics;  Laterality: Right;   Social History   Socioeconomic History  . Marital status: Single    Spouse name: Not on file  . Number of children: 3  . Years of education: Not on file  . Highest education level: Some college, no degree  Occupational History  . Not on file  Tobacco Use  . Smoking status: Current Every Day Smoker    Packs/day: 0.50    Types: Cigarettes    Last attempt to quit: 08/04/2016    Years since quitting: 3.1  . Smokeless tobacco: Never  Used  Substance and Sexual Activity  . Alcohol use: No  . Drug use: No  . Sexual activity: Not on file  Other Topics Concern  . Not on file  Social History Narrative   Lives at home alone in an apt   Right handed   Disabled since 1998   Caffeine: about 30 oz daily   Social Determinants of Health   Financial Resource Strain:   . Difficulty of Paying Living Expenses:   Food Insecurity:   . Worried About Charity fundraiser in the Last Year:   . Arboriculturist in the Last Year:   Transportation Needs:   . Film/video editor (Medical):   [name] Lack of Transportation (Non-Medical):   Physical Activity:   . Days of  Exercise per Week:   . Minutes of Exercise per Session:   Stress:   . Feeling of Stress :   Social Connections:   . Frequency of Communication with Friends and Family:   . Frequency of Social Gatherings with Friends and Family:   . Attends Religious Services:   . Active Member of Clubs or Organizations:   . Attends Archivist Meetings:   [name] Marital Status:    Family History  Problem Relation Age of Onset  . Other Mother        ?lupus   . Cancer Mother   . High Cholesterol Mother   . CAD Father        CABG  . High Cholesterol Father   . Arthritis Father   . Cancer Sister   . Heart murmur Sister   . Heart murmur Brother   . High blood pressure Other        "for everybody"  . High Cholesterol Other        "for everybody"   Allergies  Allergen Reactions  . Hydroxyzine Hives and Rash  . Gabapentin     Dizzy and confusion  . Hydrocodone Hives and Rash    "terrible scratching"  Make take with benadryl  . Ketorolac Rash     May take with benadryl  . Percocet [Oxycodone-Acetaminophen] Itching and Rash    May take with benadryl    . Toradol [Ketorolac Tromethamine] Rash    May take with benadryl   Prior to Admission medications   Medication Sig Start Date End Date Taking? Authorizing Provider  aspirin EC 325 MG tablet Take 1 tablet (325 mg total) by mouth daily. 09/30/19   Lattie Corns, PA-C  Carboxymethylcellulose Sodium (LUBRICANT EYE DROPS OP) Place 1 drop into both eyes daily as needed (Dry eye). Systane    [provider]  citalopram (CELEXA) 40 MG tablet Take 40 mg by mouth daily.    [provider]  diclofenac Sodium (VOLTAREN) 1 % GEL Apply 1 application topically daily as needed. 09/13/19   [provider]  diphenhydrAMINE (BENADRYL) 25 MG tablet Take 25 mg by mouth every 6 (six) hours as needed for itching.    [provider]  fluticasone (FLONASE) 50 MCG/ACT nasal spray Place 2 sprays into both nostrils daily as  needed for allergies or rhinitis.    [provider]  hydrochlorothiazide (HYDRODIURIL) 25 MG tablet Take 25 mg by mouth daily. 01/16/18   [provider]  IBU 800 MG tablet Take 800 mg by mouth daily as needed for mild pain or moderate pain.  09/25/17   [provider]  lamoTRIgine (LAMICTAL) 200 MG tablet Take 200 mg  by mouth daily.    [provider]  lidocaine (LIDODERM) 5 % Place 1 patch onto the skin daily as needed (arthritis).  09/13/19   [provider]  lisinopril (PRINIVIL,ZESTRIL) 40 MG tablet Take 40 mg by mouth daily.    [provider]  omeprazole (PRILOSEC) 40 MG capsule Take 40 mg by mouth daily.    [provider]  oxyCODONE (OXY IR/ROXICODONE) 5 MG immediate release tablet Take 1-2 tablets (5-10 mg total) by mouth every 4 (four) hours as needed for moderate pain (pain score 4-6). 09/30/19   Lattie Corns, PA-C  tiZANidine (ZANAFLEX) 2 MG tablet Take 2 mg by mouth 2 (two) times daily as needed for muscle spasms.  09/10/19   [provider]  traMADol (ULTRAM) 50 MG tablet Take 1 tablet (50 mg total) by mouth every 6 (six) hours. 09/30/19   Lattie Corns, PA-C  Travoprost, BAK Free, (TRAVATAN) 0.004 % SOLN ophthalmic solution Place 1 drop into both eyes at bedtime. 08/10/19   [provider]   DG Chest 1 View  Result Date: 10/02/2019 CLINICAL DATA:  Altered mental status. Vomiting. Right shoulder surgery 2 days ago. EXAM: CHEST  1 VIEW COMPARISON:  Chest radiograph 07/01/2019. Shoulder radiograph 09/30/2019 FINDINGS: Low lung volumes. Unchanged heart size and mediastinal contours. Streaky atelectasis in the lung bases. No confluent airspace disease. No pleural fluid or pneumothorax. Right shoulder arthroplasty, alignment not well assessed on this supine chest radiograph, however the humeral component is not definitively aligned with the glenoid. Right shoulder skin staples are noted. IMPRESSION: 1. Low  lung volumes with streaky atelectasis in the lung bases. 2. Recent right shoulder arthroplasty. Glenohumeral alignment is not well assessed on this supine chest radiograph, however the humeral arthroplasty is not definitively aligned with the glenoid. Recommend dedicated shoulder radiograph for further evaluation. Electronically Signed   By: Keith Rake M.D.   On: 10/02/2019 22:11   DG Shoulder Right  Result Date: 10/03/2019 CLINICAL DATA:  Right shoulder dislocation EXAM: RIGHT SHOULDER - 2+ VIEW COMPARISON:  Sep 30, 2019 FINDINGS: The patient is status post right shoulder arthroplasty. There is an anterior dislocation of the humeral head. Overlying soft tissue swelling seen. Surgical staples are noted. IMPRESSION: Anterior dislocation of right shoulder arthroplasty. Electronically Signed   By: Prudencio Pair M.D.   On: 10/03/2019 01:50   DG Abdomen 1 View  Result Date: 10/03/2019 CLINICAL DATA:  Abdominal pain EXAM: ABDOMEN - 1 VIEW COMPARISON:  Oct 03, 2019 FINDINGS: Tip of the NG tube is seen within the upper portion of the film with the tip just entering the GE junction. A mildly dilated air-filled loop of bowel seen within the mid abdomen. Stool seen down to the level of the rectum. IMPRESSION: Tip the NG tube just seen entering the GE junction. Electronically Signed   By: Prudencio Pair M.D.   On: 10/03/2019 01:29   CT ABDOMEN PELVIS W CONTRAST  Result Date: 10/02/2019 CLINICAL DATA:  Abdominal pain and vomiting. Right shoulder surgery 2 days ago. EXAM: CT ABDOMEN AND PELVIS WITH CONTRAST TECHNIQUE: Multidetector CT imaging of the abdomen and pelvis was performed using the standard protocol following bolus administration of intravenous contrast. CONTRAST:  16mL OMNIPAQUE IOHEXOL 300 MG/ML  SOLN COMPARISON:  Radiograph earlier this day. Abdominal CT 02/27/2018, chest abdomen pelvis CTA 07/01/2019 FINDINGS: Lower chest: Lung bases are clear. Hepatobiliary: Post cholecystectomy. There is intra and  extrahepatic biliary ductal dilatation. The common bile duct measures approximately 13 mm  at the porta hepatis. No obvious choledocholithiasis. No focal hepatic lesion. Pancreas: No ductal dilatation or inflammation. Spleen: Normal in size without focal abnormality. Adrenals/Urinary Tract: Mild left adrenal thickening. No evidence of adrenal nodule. No hydronephrosis or perinephric edema. Homogeneous renal enhancement. There is absent excretion from both kidneys on delayed phase imaging. Urinary bladder is physiologically distended without wall thickening. Stomach/Bowel: Fluid within the distal esophagus. Fluid-filled stomach. Mild Peri pyloric gastric wall thickening. Normal positioning of the ligament of Treitz. Fluid-filled small bowel that is not abnormally dilated. Small amount of mesenteric edema in the right lower quadrant. Air and liquid stool within the cecum. The ascending, transverse, and descending colon are dilated and fluid-filled. Gradual transition to less dilated sigmoid colon with liquid and solid stool. There is solid stool within the distal sigmoid colon. No colonic wall thickening or inflammation. There is no bowel pneumatosis. Vascular/Lymphatic: Aorto bi-iliac atherosclerosis. No aortic aneurysm. The portal vein is patent. No adenopathy. Reproductive: Uterus not seen, presumably surgically absent. Ovaries are quiescent. Other: Trace free fluid in the pelvis.  No free intra-abdominal air. 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 Facet hypertrophy throughout the lumbar spine. Kyphoplasty within lower thoracic vertebral compression fractures. There are no acute or suspicious osseous abnormalities. IMPRESSION: 1. Findings consistent with generalized ileus and slow transit. Fluid/liquid stool throughout the majority of the colon with gradual transition to formed stool in the sigmoid. Fluid-filled small bowel and stomach. No transition point or obstruction.  There is minimal mesenteric edema but no definite wall thickening or bowel inflammation. 2. Intra and extrahepatic biliary ductal dilatation postcholecystectomy, progressed from February 2021 exam. Recommend correlation with LFTs. If LFTs are normal, no further imaging follow-up is needed. If LFTs are elevated, recommend further evaluation with ERCP or MRCP, MRCP should only be considered if patient is able to tolerate breath hold technique. 3. Absent excretion from both kidneys on delayed phase imaging suggest underlying renal dysfunction. Aortic Atherosclerosis (ICD10-I70.0). Electronically Signed   By: Keith Rake M.D.   On: 10/02/2019 23:38   DG Abd Portable 1 View  Result Date: 10/02/2019 CLINICAL DATA:  Hypotension and vomiting EXAM: PORTABLE ABDOMEN - 1 VIEW COMPARISON:  None. FINDINGS: There is a mildly dilated air-filled loop of probable cecum seen within the right lower quadrant. There is air seen down to the level of the rectum. IMPRESSION: Mildly dilated air-filled loop of bowel in the right lower quadrant, likely cecum which could be due to ileus versus partial bowel obstruction. Electronically Signed   By: Prudencio Pair M.D.   On: 10/02/2019 22:35    Positive ROS: All other systems have been reviewed and were otherwise negative with the exception of those mentioned in the HPI and as above.  Physical Exam: General: Well developed, well nourished ill-appearing female seen in obvious discomfort. HEENT: Atraumatic and normocephalic. Sclera are clear. Extraocular motion is intact.  NG tube is in place.  Neck: Supple, nontender, good range of motion. No JVD or carotid bruits. Lungs: Clear to auscultation bilaterally. Cardiovascular: Regular rate and rhythm with normal S1 and S2. No murmurs. No gallops or rubs.  Abdomen: Soft, mildly distended.  Generalized tenderness to palpation. Skin: No lesions in the area of chief complaint Neurologic: Awake, alert, and oriented. Sensory function is  grossly intact. Motor strength is grossly intact with exception of the right upper extremity that was not assessed due to the injury.. No clonus or tremor. Good motor coordination. Lymphatic: No axillary or cervical lymphadenopathy  MUSCULOSKELETAL:  Emanation of the right shoulder demonstrates anterior shoulder dressing with scant old bloody drainage.  No significant ecchymosis or swelling to the shoulder.  There is significant pain elicited with any type of motion of the shoulder with obvious muscle spasm.  Assessment: Anterior dislocation of the right total shoulder arthroplasty  Plan: In review of the radiographs, I do not appreciate any evidence of fracture or hardware failure.  The patient demonstrates significant guarding and muscle spasm, so that any attempt at closed reduction without sedation would be extremely difficult.  I also have concern that she may have torn her subscapularis (that may need surgical repair).  I have communicated this information with Dr. Roland Rack.  Return to the OR for possible closed reduction versus open reduction with repair of the subscapularis could possibly be planned for Tuesday if the patient is deemed medically stable.  I have contacted both Medicine and General Surgery to work on a reasonable treatment plan.  Zadaya Cuadra P. Holley Bouche M.D.

## 2019-10-03 NOTE — ED Notes (Signed)
Iv team here for midline insertion.

## 2019-10-03 NOTE — ED Provider Notes (Signed)
Confirmed again with the patient, she is DO NOT RESUSCITATE.  The policeman is alert and well oriented and appears to have the capacity to make this decision.  Also discussed with [name] who patient advises her point of contact, [name] tells me the patient has also expressed that she would not wish to be on a ventilator and would not want wish to be resuscitated telling me that she has a DO NOT RESUSCITATE on her refrigerator at home as well.  Discussed this with the patient in the presence of Dr. Sidney Ace as well and patient again reaffirms NO CPR, DO NOT RESUSCITATE, DO NOT INTUBATE.  Patient had a brief episode of bradycardia, this is once again resolved.  Heart rate now 60 appears to be sinus.  Her condition with her episode of bradycardia does escalate her level of care and hospitalist discussing plan to admit to ICU level care instead. SBP 101, HR 60. Alert, oriented. Continues to have abdominal pain. NG tub being advanced 10 cm by RN.  Dr. Alfred Levins continue ER care at this time, Dr. Sidney Ace coordinating with ICU team for ICU level admit.    Delman Kitten, MD 10/03/19 [phone]

## 2019-10-03 NOTE — ED Notes (Signed)
Pt rolled onto side, pox monitor off at this time, cleansing pt of stool.

## 2019-10-03 NOTE — Progress Notes (Signed)
Patient was given po mediation. NG intermittent suction was stopped and clamped, will restarted in one hour. Will continue to monitor closely

## 2019-10-03 NOTE — ED Notes (Signed)
Pt more alert, md at bedside updating.

## 2019-10-03 NOTE — H&P (Addendum)
Sydney Evans NAME: Sydney Evans    MR#:  [id number]  DATE OF BIRTH:   11, 1952  DATE OF ADMISSION:  10/02/2019  PRIMARY CARE PHYSICIAN: Patient, No Pcp Per   REQUESTING/REFERRING PHYSICIAN: Delman Kitten, MD  CHIEF COMPLAINT:   Chief Complaint  Patient presents with  . Hypotension    HISTORY OF PRESENT ILLNESS:  Sydney Evans  is a 69 y.o. Caucasian female with a known history of hypertension bipolar disorder, migraine, CVA and TIA, and anxiety and anemia, who is status post right total shoulder replacement on 5/27, who presented to the emergency room with acute onset of generalized abdominal pain with associated nausea and vomiting and significant constipation and therefore diminished appetite and associated lethargy.  She denies any reported fever or chills.  No cough or wheezing or dyspnea.  No chest pain or palpitations.  Upon presentation to the emergency room, initial blood pressure was 52/27 with a pulse of 50 and respiratory to 24 but responded to 2 L bolus of IV normal saline with blood pressure coming up to 116/63 with pulse of 73 and temperature was 97.7 approximately 100% on room air.  Labs were remarkable for hyponatremia hypokalemia and hypokalemia with potassium of 2.2.  BUN and creatinine were 15/1.37 up from 9/0.63 yesterday and calcium 7.1 compared to 8.1 yesterday.  Magnesium was 1.7 and lipase 145 total protein 5.4 and albumin of 2.6.  High-sensitivity troponin I was 76.  CBC showed leukocytosis of 14 with anemia close to yesterday.  Lactic acid was elevated at 4.4.   COVID-19 PCR came back negative.  Blood cultures were drawn.  Abdominal x-ray revealed mildly dilated air-filled loops of bowel in the right lower quadrant that could be due to ileus versus partial bowel obstruction.  Chest x-ray showed low lung volumes with streaky atelectasis in the lung bases her right recent shoulder arthroplasty with glenohumeral alignment not well  assessed on the supine chest x-ray however the humeral arthroplasty is not definitely aligned with the glenoid with recommendation for dedicated shoulder radiograph for further evaluation.  Abdominal and pelvic CT scan revealed findings consistent with generalized ileus and slow transit.  There were fluid stools throughout the majority of the colon with gradual transition to formed stool in the sigmoid as well as fluid-filled small bowel and stomach with no transition point or obstruction and minimal mesenteric edema with no definite wall thickening or bowel inflammation.  It also showed intra and extra hepatic biliary ductal dilatation postcholecystectomy that has progressed from February 2021 exam.  It showed absent excretion from both kidneys on delayed phase imaging suggesting underlying renal dysfunction as well as aortic atherosclerosis.  The patient was empirically given IV cefepime and vancomycin as well as 12.5 mg IV Benadryl, 25 mcg of IV fentanyl, 3 L bolus of IV normal saline and 10 mEq IV potassium chloride.  She will be admitted to a progressive unit bed for further evaluation and management.   PAST MEDICAL HISTORY:   Past Medical History:  Diagnosis Date  . Anemia   . Anxiety   . Arthritis   . Barrett's esophagus   . Bipolar disorder ([place])   . 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 Blood clot in vein   . Cancer ([place])    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  . Carotid arterial disease ([place])   . Contracture of joint of upper arm   . Depression   . Elevated lipids   . GERD (  gastroesophageal reflux disease)   . Hypertension   . Migraine   . Osteoporosis   . Poor balance   . Sinus congestion   . Stroke ([place])    x 2  . TIA (transient ischemic attack)   . Vertigo     PAST SURGICAL HISTORY:   Past Surgical History:  Procedure Laterality Date  . ABDOMINAL HYSTERECTOMY    . BACK SURGERY    . BREAST BIOPSY Left   . CARPAL TUNNEL RELEASE Bilateral   . CATARACT EXTRACTION W/ INTRAOCULAR LENS   IMPLANT, BILATERAL Bilateral   . CHOLECYSTECTOMY    . COLONOSCOPY WITH PROPOFOL N/A 04/09/2018   Procedure: COLONOSCOPY WITH PROPOFOL;  Surgeon: Jonathon Bellows, MD;  Location: [facility] ENDOSCOPY;  Service: Gastroenterology;  Laterality: N/A;  . crystal cyst removed on left foot    . ESOPHAGOGASTRODUODENOSCOPY (EGD) WITH PROPOFOL N/A 04/09/2018   Procedure: ESOPHAGOGASTRODUODENOSCOPY (EGD) WITH PROPOFOL;  Surgeon: Jonathon Bellows, MD;  Location: [facility] ENDOSCOPY;  Service: Gastroenterology;  Laterality: N/A;  . EYE SURGERY    . femoral fx    . FOOT SURGERY    . GIVENS CAPSULE STUDY N/A 06/16/2018   Procedure: GIVENS CAPSULE STUDY;  Surgeon: Jonathon Bellows, MD;  Location: [facility] ENDOSCOPY;  Service: Gastroenterology;  Laterality: N/A;  . JOINT REPLACEMENT    . KNEE SURGERY    . TONSILLECTOMY    . TOTAL KNEE ARTHROPLASTY Bilateral   . TOTAL SHOULDER ARTHROPLASTY Left 08/01/2016   Procedure: TOTAL SHOULDER ARTHROPLASTY;  Surgeon: Corky Mull, MD;  Location: [facility] ORS;  Service: Orthopedics;  Laterality: Left;  . TOTAL SHOULDER ARTHROPLASTY Right 09/30/2019   Procedure: TOTAL SHOULDER ARTHROPLASTY;  Surgeon: Corky Mull, MD;  Location: [facility] ORS;  Service: Orthopedics;  Laterality: Right;    SOCIAL HISTORY:   Social History   Tobacco Use  . Smoking status: Current Every Day Smoker    Packs/day: 0.50    Types: Cigarettes    Last attempt to quit: 08/04/2016    Years since quitting: 3.1  . Smokeless tobacco: Never Used  Substance Use Topics  . Alcohol use: No    FAMILY HISTORY:   Family History  Problem Relation Age of Onset  . Other Mother        ?lupus   . Cancer Mother   . High Cholesterol Mother   . CAD Father        CABG  . High Cholesterol Father   . Arthritis Father   . Cancer Sister   . Heart murmur Sister   . Heart murmur Brother   . High blood pressure Other        "for everybody"  . High Cholesterol Other        "for everybody"    DRUG ALLERGIES:   Allergies  Allergen  Reactions  . Hydroxyzine Hives and Rash  . Gabapentin     Dizzy and confusion  . Hydrocodone Hives and Rash    "terrible scratching"  Make take with benadryl  . Ketorolac Rash     May take with benadryl  . Percocet [Oxycodone-Acetaminophen] Itching and Rash    May take with benadryl    . Toradol [Ketorolac Tromethamine] Rash    May take with benadryl    REVIEW OF SYSTEMS:   ROS As per history of present illness. All pertinent systems were reviewed above. Constitutional,  HEENT, cardiovascular, respiratory, GI, GU, musculoskeletal, neuro, psychiatric, endocrine,  integumentary and hematologic systems were reviewed and are otherwise  negative/unremarkable except for  positive findings mentioned above in the HPI.   MEDICATIONS AT HOME:   Prior to Admission medications   Medication Sig Start Date End Date Taking? Authorizing Provider  aspirin EC 325 MG tablet Take 1 tablet (325 mg total) by mouth daily. 09/30/19   Lattie Corns, PA-C  Carboxymethylcellulose Sodium (LUBRICANT EYE DROPS OP) Place 1 drop into both eyes daily as needed (Dry eye). Systane    [provider]  citalopram (CELEXA) 40 MG tablet Take 40 mg by mouth daily.    [provider]  diclofenac Sodium (VOLTAREN) 1 % GEL Apply 1 application topically daily as needed. 09/13/19   [provider]  diphenhydrAMINE (BENADRYL) 25 MG tablet Take 25 mg by mouth every 6 (six) hours as needed for itching.    [provider]  fluticasone (FLONASE) 50 MCG/ACT nasal spray Place 2 sprays into both nostrils daily as needed for allergies or rhinitis.    [provider]  hydrochlorothiazide (HYDRODIURIL) 25 MG tablet Take 25 mg by mouth daily. 01/16/18   [provider]  IBU 800 MG tablet Take 800 mg by mouth daily as needed for mild pain or moderate pain.  09/25/17   [provider]  lamoTRIgine (LAMICTAL) 200 MG tablet Take 200 mg by mouth daily.    [provider]  lidocaine (LIDODERM) 5 % Place 1 patch onto the skin daily as needed (arthritis).  09/13/19   [provider]  lisinopril (PRINIVIL,ZESTRIL) 40 MG tablet Take 40 mg by mouth daily.    [provider]  omeprazole (PRILOSEC) 40 MG capsule Take 40 mg by mouth daily.    [provider]  oxyCODONE (OXY IR/ROXICODONE) 5 MG immediate release tablet Take 1-2 tablets (5-10 mg total) by mouth every 4 (four) hours as needed for moderate pain (pain score 4-6). 09/30/19   Lattie Corns, PA-C  tiZANidine (ZANAFLEX) 2 MG tablet Take 2 mg by mouth 2 (two) times daily as needed for muscle spasms.  09/10/19   [provider]  traMADol (ULTRAM) 50 MG tablet Take 1 tablet (50 mg total) by mouth every 6 (six) hours. 09/30/19   Lattie Corns, PA-C  Travoprost, BAK Free, (TRAVATAN) 0.004 % SOLN ophthalmic solution Place 1 drop into both eyes at bedtime. 08/10/19   [provider]      VITAL SIGNS:  Blood pressure (!) 102/57, pulse (!) 50, temperature 97.7 F (36.5 C), resp. rate (!) 27, SpO2 98 %.  PHYSICAL EXAMINATION:  Physical Exam  GENERAL:  69 y.o.-year-old Caucasian female patient lying in the bed with no acute distress.  EYES: Pupils equal, round, reactive to light and accommodation. No scleral icterus. Extraocular muscles intact.  HEENT: Head atraumatic, normocephalic. Oropharynx and nasopharynx clear.  NECK:  Supple, no jugular venous distention. No thyroid enlargement, no tenderness.  LUNGS: Normal breath sounds bilaterally, no wheezing, rales,rhonchi or crepitation. No use of accessory muscles of respiration.  CARDIOVASCULAR: Regular rate and rhythm, S1, S2 normal. No murmurs, rubs, or gallops.  ABDOMEN: Soft, EXTREMITIES: No pedal edema, cyanosis, or clubbing.  NEUROLOGIC: Cranial nerves II through XII are intact. Muscle strength 5/5 in all extremities. Sensation intact. Gait not checked.  PSYCHIATRIC: The patient is alert and oriented x 3.   Normal affect and good eye contact. SKIN: No obvious rash, lesion, or ulcer.   LABORATORY PANEL:   CBC Recent Labs  Lab 10/02/19 2145  WBC 14.0*  HGB 8.9*  HCT 29.5*  PLT 402*   ------------------------------------------------------------------------------------------------------------------  Chemistries  Recent Labs  Lab 10/02/19 2145  NA 131*  K 2.2*  CL 106  CO2 14*  GLUCOSE 167*  BUN 15  CREATININE 1.37*  CALCIUM 7.1*  MG 1.7  AST 78*  ALT 22  ALKPHOS 66  BILITOT 0.7   ------------------------------------------------------------------------------------------------------------------  Cardiac Enzymes No results for input(s): TROPONINI in the last 168 hours. ------------------------------------------------------------------------------------------------------------------  RADIOLOGY:  DG Chest 1 View  Result Date: 10/02/2019 CLINICAL DATA:  Altered mental status. Vomiting. Right shoulder surgery 2 days ago. EXAM: CHEST  1 VIEW COMPARISON:  Chest radiograph 07/01/2019. Shoulder radiograph 09/30/2019 FINDINGS: Low lung volumes. Unchanged heart size and mediastinal contours. Streaky atelectasis in the lung bases. No confluent airspace disease. No pleural fluid or pneumothorax. Right shoulder arthroplasty, alignment not well assessed on this supine chest radiograph, however the humeral component is not definitively aligned with the glenoid. Right shoulder skin staples are noted. IMPRESSION: 1. Low lung volumes with streaky atelectasis in the lung bases. 2. Recent right shoulder arthroplasty. Glenohumeral alignment is not well assessed on this supine chest radiograph, however the humeral arthroplasty is not definitively aligned with the glenoid. Recommend dedicated shoulder radiograph for further evaluation. Electronically Signed   By: Keith Rake M.D.   On: 10/02/2019 22:11   CT ABDOMEN PELVIS W CONTRAST  Result Date: 10/02/2019 CLINICAL DATA:  Abdominal pain and  vomiting. Right shoulder surgery 2 days ago. EXAM: CT ABDOMEN AND PELVIS WITH CONTRAST TECHNIQUE: Multidetector CT imaging of the abdomen and pelvis was performed using the standard protocol following bolus administration of intravenous contrast. CONTRAST:  95mL OMNIPAQUE IOHEXOL 300 MG/ML  SOLN COMPARISON:  Radiograph earlier this day. Abdominal CT 02/27/2018, chest abdomen pelvis CTA 07/01/2019 FINDINGS: Lower chest: Lung bases are clear. Hepatobiliary: Post cholecystectomy. There is intra and extrahepatic biliary ductal dilatation. The common bile duct measures approximately 13 mm at the porta hepatis. No obvious choledocholithiasis. No focal hepatic lesion. Pancreas: No ductal dilatation or inflammation. Spleen: Normal in size without focal abnormality. Adrenals/Urinary Tract: Mild left adrenal thickening. No evidence of adrenal nodule. No hydronephrosis or perinephric edema. Homogeneous renal enhancement. There is absent excretion from both kidneys on delayed phase imaging. Urinary bladder is physiologically distended without wall thickening. Stomach/Bowel: Fluid within the distal esophagus. Fluid-filled stomach. Mild Peri pyloric gastric wall thickening. Normal positioning of the ligament of Treitz. Fluid-filled small bowel that is not abnormally dilated. Small amount of mesenteric edema in the right lower quadrant. Air and liquid stool within the cecum. The ascending, transverse, and descending colon are dilated and fluid-filled. Gradual transition to less dilated sigmoid colon with liquid and solid stool. There is solid stool within the distal sigmoid colon. No colonic wall thickening or inflammation. There is no bowel pneumatosis. Vascular/Lymphatic: Aorto bi-iliac atherosclerosis. No aortic aneurysm. The portal vein is patent. No adenopathy. Reproductive: Uterus not seen, presumably surgically absent. Ovaries are quiescent. Other: Trace free fluid in the pelvis.  No free intra-abdominal air.  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 Facet hypertrophy throughout the lumbar spine. Kyphoplasty within lower thoracic vertebral compression fractures. There are no acute or suspicious osseous abnormalities. IMPRESSION: 1. Findings consistent with generalized ileus and slow transit. Fluid/liquid stool throughout the majority of the colon with gradual transition to formed stool in the sigmoid. Fluid-filled small bowel and stomach. No transition point or obstruction. There is minimal mesenteric edema but no definite wall thickening or bowel inflammation. 2. Intra and extrahepatic biliary ductal dilatation postcholecystectomy, progressed from February 2021 exam. Recommend correlation  with LFTs. If LFTs are normal, no further imaging follow-up is needed. If LFTs are elevated, recommend further evaluation with ERCP or MRCP, MRCP should only be considered if patient is able to tolerate breath hold technique. 3. Absent excretion from both kidneys on delayed phase imaging suggest underlying renal dysfunction. Aortic Atherosclerosis (ICD10-I70.0). Electronically Signed   By: Keith Rake M.D.   On: 10/02/2019 23:38   DG Abd Portable 1 View  Result Date: 10/02/2019 CLINICAL DATA:  Hypotension and vomiting EXAM: PORTABLE ABDOMEN - 1 VIEW COMPARISON:  None. FINDINGS: There is a mildly dilated air-filled loop of probable cecum seen within the right lower quadrant. There is air seen down to the level of the rectum. IMPRESSION: Mildly dilated air-filled loop of bowel in the right lower quadrant, likely cecum which could be due to ileus versus partial bowel obstruction. Electronically Signed   By: Prudencio Pair M.D.   On: 10/02/2019 22:35      IMPRESSION AND PLAN:   1.  Generalized ileus. -The patient will be admitted to a progressive unit bed. -She will be kept n.p.o. -She will be hydrated with IV normal saline. -We will follow two-view abdomen x-ray in a.m. -Aspirin  will be held off. -General surgery consultation will be obtained. -I notified Dr. Hampton Abbot about the patient. -The patient has been having bradycardia with hypotension with vomiting, likely neurally mediated/vasovagal reaction with improvement with hydration. -The patient's case was discussed with Dr. Lanney Gins and the ICU team and at this time since her blood pressure is improving she was thought to be appropriate for progressive unit bed.  Should she deteriorate, the ICU team will be glad to consult.  2.  Elevated lactic acid level, rule out severe sepsis.  She has lactic acid of 4.4 with leukocytosis and hypotension with tachypnea. -We will continue IV vancomycin cefepime and Flagyl for unknown source at this time. -We will continue hydration with IV normal saline monitor blood pressure given her hypotension. -We will follow lactic acid level and obtain procalcitonin level.  3.  Severe hypokalemia. -This could be contributing to her ileus and is clearly secondary to intractable nausea and vomiting. -Potassium will be aggressively replaced and magnesium level will be replaced as well.  4.  Status post right shoulder arthroplasty with questionable malalignment. -We will obtain a dedicated right shoulder x-ray as well as an orthopedic consultation. -I notified Dr. Marry Guan about the patient.  5.  Hypertension. -We will hold off HCTZ and Zestril given her initial hypotension.  6.  Bipolar disorder. -We will continue Lamictal.  7.  GERD. -We will continue PPI therapy.  8.  DVT prophylaxis. -Subcutaneous Lovenox.   All the records are reviewed and case discussed with ED provider. The plan of care was discussed in details with the patient (and family). I answered all questions. The patient agreed to proceed with the above mentioned plan. Further management will depend upon hospital course.   CODE STATUS: The patient is DNR/DNI.  She has an out of facility DNR form.  Status is:  Inpatient  Remains inpatient appropriate because:Hemodynamically unstable, Ongoing active pain requiring inpatient pain management, Ongoing diagnostic testing needed not appropriate for outpatient work up, Unsafe d/c plan, IV treatments appropriate due to intensity of illness or inability to take PO and Inpatient level of care appropriate due to severity of illness   Dispo: The patient is from: Home              Anticipated d/c is to: Home  Anticipated d/c date is: 3 days              Patient currently is not medically stable to d/c.  TOTAL TIME TAKING CARE OF THIS PATIENT: 60 minutes.    Christel Mormon M.D on 10/03/2019 at 12:58 AM  [facility]   From 7 PM-7 AM, contact night-coverage [email or web address]  CC: Primary care physician; Patient, No Pcp Per   Note: This dictation was prepared with Dragon dictation along with smaller phrase technology. Any transcriptional errors that result from this process are unintentional.

## 2019-10-03 NOTE — Consult Note (Signed)
Date of Consultation:  10/03/2019  Requesting Physician:  Delman Kitten, MD  Reason for Consultation:  Ileus  History of Present Illness: Sydney Evans is a 69 y.o. female admitted overnight to the hospitalist team with diffuse ileus.  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 Roland Rack.  Patient reports that since she got home, she started having abdominal distention, which progressed into nausea and vomiting.  She also reports abdominal pain but denies any fevers, chills, chest pain, shortness of breath.  In the emergency room, she was noted to be in AKI with a creatinine of 1.37 from prior of 0.63, severe hypokalemia with potassium of 2.2, lactic acidosis of 4.4, and WBC of 14.  She had a CT scan of the abdomen pelvis showing generalized ileus with no transition point or evidence of bowel obstruction.  This morning, the patient reports that she felt a bit better.  She still had lactic acidosis, but this has improved by late morning to 2.7.  She's had 700 ml out of NG tube so far today.  Past Medical History: Past Medical History:  Diagnosis Date  . Anemia   . Anxiety   . Arthritis   . Barrett's esophagus   . Bipolar disorder ([place])   . 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 Blood clot in vein   . Cancer ([place])    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  . Carotid arterial disease ([place])   . Contracture of joint of upper arm   . Depression   . Elevated lipids   . GERD (gastroesophageal reflux disease)   . Hypertension   . Migraine   . Osteoporosis   . Poor balance   . Sinus congestion   . 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 TIA (transient ischemic attack)   . Vertigo      Past Surgical History: Past Surgical History:  Procedure Laterality Date  . ABDOMINAL HYSTERECTOMY    . BACK SURGERY    . BREAST BIOPSY Left   . CARPAL TUNNEL RELEASE Bilateral   . CATARACT EXTRACTION W/ INTRAOCULAR LENS  IMPLANT, BILATERAL Bilateral   . CHOLECYSTECTOMY    . COLONOSCOPY WITH PROPOFOL N/A 04/09/2018    Procedure: COLONOSCOPY WITH PROPOFOL;  Surgeon: Jonathon Bellows, MD;  Location: [facility] ENDOSCOPY;  Service: Gastroenterology;  Laterality: N/A;  . crystal cyst removed on left foot    . ESOPHAGOGASTRODUODENOSCOPY (EGD) WITH PROPOFOL N/A 04/09/2018   Procedure: ESOPHAGOGASTRODUODENOSCOPY (EGD) WITH PROPOFOL;  Surgeon: Jonathon Bellows, MD;  Location: [facility] ENDOSCOPY;  Service: Gastroenterology;  Laterality: N/A;  . EYE SURGERY    . femoral fx    . FOOT SURGERY    . GIVENS CAPSULE STUDY N/A 06/16/2018   Procedure: GIVENS CAPSULE STUDY;  Surgeon: Jonathon Bellows, MD;  Location: [facility] ENDOSCOPY;  Service: Gastroenterology;  Laterality: N/A;  . JOINT REPLACEMENT    . KNEE SURGERY    . TONSILLECTOMY    . TOTAL KNEE ARTHROPLASTY Bilateral   . TOTAL SHOULDER ARTHROPLASTY Left 08/01/2016   Procedure: TOTAL SHOULDER ARTHROPLASTY;  Surgeon: Corky Mull, MD;  Location: [facility] ORS;  Service: Orthopedics;  Laterality: Left;  . TOTAL SHOULDER ARTHROPLASTY Right 09/30/2019   Procedure: TOTAL SHOULDER ARTHROPLASTY;  Surgeon: Corky Mull, MD;  Location: [facility] ORS;  Service: Orthopedics;  Laterality: Right;    Home Medications: Prior to Admission medications   Medication Sig Start Date End Date Taking? Authorizing Provider  aspirin EC 325 MG tablet Take 1 tablet (325  mg total) by mouth daily. 09/30/19   Lattie Corns, PA-C  Carboxymethylcellulose Sodium (LUBRICANT EYE DROPS OP) Place 1 drop into both eyes daily as needed (Dry eye). Systane    [provider]  citalopram (CELEXA) 40 MG tablet Take 40 mg by mouth daily.    [provider]  diclofenac Sodium (VOLTAREN) 1 % GEL Apply 1 application topically daily as needed. 09/13/19   [provider]  diphenhydrAMINE (BENADRYL) 25 MG tablet Take 25 mg by mouth every 6 (six) hours as needed for itching.    [provider]  fluticasone (FLONASE) 50 MCG/ACT nasal spray Place 2 sprays into both nostrils daily as needed for allergies or  rhinitis.    [provider]  hydrochlorothiazide (HYDRODIURIL) 25 MG tablet Take 25 mg by mouth daily. 01/16/18   [provider]  IBU 800 MG tablet Take 800 mg by mouth daily as needed for mild pain or moderate pain.  09/25/17   [provider]  lamoTRIgine (LAMICTAL) 200 MG tablet Take 200 mg by mouth daily.    [provider]  lidocaine (LIDODERM) 5 % Place 1 patch onto the skin daily as needed (arthritis).  09/13/19   [provider]  lisinopril (PRINIVIL,ZESTRIL) 40 MG tablet Take 40 mg by mouth daily.    [provider]  omeprazole (PRILOSEC) 40 MG capsule Take 40 mg by mouth daily.    [provider]  oxyCODONE (OXY IR/ROXICODONE) 5 MG immediate release tablet Take 1-2 tablets (5-10 mg total) by mouth every 4 (four) hours as needed for moderate pain (pain score 4-6). 09/30/19   Lattie Corns, PA-C  tiZANidine (ZANAFLEX) 2 MG tablet Take 2 mg by mouth 2 (two) times daily as needed for muscle spasms.  09/10/19   [provider]  traMADol (ULTRAM) 50 MG tablet Take 1 tablet (50 mg total) by mouth every 6 (six) hours. 09/30/19   Lattie Corns, PA-C  Travoprost, BAK Free, (TRAVATAN) 0.004 % SOLN ophthalmic solution Place 1 drop into both eyes at bedtime. 08/10/19   [provider]    Allergies: Allergies  Allergen Reactions  . Hydroxyzine Hives and Rash  . Gabapentin     Dizzy and confusion  . Hydrocodone Hives and Rash    "terrible scratching"  Make take with benadryl  . Ketorolac Rash     May take with benadryl  . Percocet [Oxycodone-Acetaminophen] Itching and Rash    May take with benadryl    . Toradol [Ketorolac Tromethamine] Rash    May take with benadryl    Social History:  reports that she has been smoking cigarettes. She has been smoking about 0.50 packs per day. She has never used smokeless tobacco. She reports that she does not drink alcohol or use drugs.   Family History: Family History   Problem Relation Age of Onset  . Other Mother        ?lupus   . Cancer Mother   . High Cholesterol Mother   . CAD Father        CABG  . High Cholesterol Father   . Arthritis Father   . Cancer Sister   . Heart murmur Sister   . Heart murmur Brother   . High blood pressure Other        "for everybody"  . High Cholesterol Other        "for everybody"    Review of Systems: Review of Systems  Constitutional: Negative for chills and  fever.  HENT: Negative for hearing loss.   Respiratory: Negative for shortness of breath.   Cardiovascular: Negative for chest pain.  Gastrointestinal: Positive for abdominal pain, nausea and vomiting.  Genitourinary: Negative for dysuria.  Musculoskeletal: Positive for joint pain (from recent shoulder surgery). Negative for myalgias.  Skin: Negative for rash.  Neurological: Negative for dizziness.  Psychiatric/Behavioral: Negative for depression.    Physical Exam BP (!) 126/44 (BP Location: Left Arm)   Pulse 66   Temp 99.3 F (37.4 C) (Oral)   Resp 17   Wt 76.2 kg   SpO2 97%   BMI 29.76 kg/m  CONSTITUTIONAL: No acute distress HEENT:  Normocephalic, atraumatic, extraocular motion intact. NECK: Trachea is midline, and there is no jugular venous distension. RESPIRATORY:  Lungs are clear, and breath sounds are equal bilaterally. Normal respiratory effort without pathologic use of accessory muscles. CARDIOVASCULAR: Heart is regular without murmurs, gallops, or rubs. GI: The abdomen is soft, distended, with some mild tenderness to palpation over mid abdomen.  MUSCULOSKELETAL:  Normal muscle strength and tone in all four extremities.  No peripheral edema or cyanosis. SKIN: Skin turgor is normal. There are no pathologic skin lesions.  NEUROLOGIC:  Motor and sensation is grossly normal.  Cranial nerves are grossly intact. PSYCH:  Alert and oriented to person, place and time. Affect is normal.  Laboratory Analysis: Results for orders placed or  performed during the hospital encounter of 10/02/19 (from the past 24 hour(s))  CBC with Differential     Status: Abnormal   Collection Time: 10/02/19  9:45 PM  Result Value Ref Range   WBC 14.0 (H) 4.0 - 10.5 K/uL   RBC 3.94 3.87 - 5.11 MIL/uL   Hemoglobin 8.9 (L) 12.0 - 15.0 g/dL   HCT 29.5 (L) 36.0 - 46.0 %   MCV 74.9 (L) 80.0 - 100.0 fL   MCH 22.6 (L) 26.0 - 34.0 pg   MCHC 30.2 30.0 - 36.0 g/dL   RDW 17.2 (H) 11.5 - 15.5 %   Platelets 402 (H) 150 - 400 K/uL   nRBC 0.0 0.0 - 0.2 %   Neutrophils Relative % 90 %   Neutro Abs 12.6 (H) 1.7 - 7.7 K/uL   Lymphocytes Relative 5 %   Lymphs Abs 0.7 0.7 - 4.0 K/uL   Monocytes Relative 2 %   Monocytes Absolute 0.3 0.1 - 1.0 K/uL   Eosinophils Relative 1 %   Eosinophils Absolute 0.1 0.0 - 0.5 K/uL   Basophils Relative 1 %   Basophils Absolute 0.1 0.0 - 0.1 K/uL   Immature Granulocytes 1 %   Abs Immature Granulocytes 0.17 (H) 0.00 - 0.07 K/uL  Comprehensive metabolic panel     Status: Abnormal   Collection Time: 10/02/19  9:45 PM  Result Value Ref Range   Sodium 131 (L) 135 - 145 mmol/L   Potassium 2.2 (LL) 3.5 - 5.1 mmol/L   Chloride 106 98 - 111 mmol/L   CO2 14 (L) 22 - 32 mmol/L   Glucose, Bld 167 (H) 70 - 99 mg/dL   BUN 15 8 - 23 mg/dL   Creatinine, Ser 1.37 (H) 0.44 - 1.00 mg/dL   Calcium 7.1 (L) 8.9 - 10.3 mg/dL   Total Protein 5.4 (L) 6.5 - 8.1 g/dL   Albumin 2.6 (L) 3.5 - 5.0 g/dL   AST 78 (H) 15 - 41 U/L   ALT 22 0 - 44 U/L   Alkaline Phosphatase 66 38 - 126 U/L   Total Bilirubin 0.7  0.3 - 1.2 mg/dL   GFR calc non Af Amer 40 (L) >60 mL/min   GFR calc Af Amer 46 (L) >60 mL/min   Anion gap 11 5 - 15  Type and screen [place]     Status: None   Collection Time: 10/02/19  9:45 PM  Result Value Ref Range   ABO/RH(D) O POS    Antibody Screen NEG    Sample Expiration      10/05/2019,2359 Performed at [facility], [address] [place], [place] [address]   Troponin I (High  Sensitivity)     Status: Abnormal   Collection Time: 10/02/19  9:45 PM  Result Value Ref Range   Troponin I (High Sensitivity) 76 (H) <18 ng/L  Lactic acid, plasma     Status: Abnormal   Collection Time: 10/02/19  9:45 PM  Result Value Ref Range   Lactic Acid, Venous 4.4 (HH) 0.5 - 1.9 mmol/L  Blood culture (routine x 2)     Status: None (Preliminary result)   Collection Time: 10/02/19  9:45 PM   Specimen: BLOOD  Result Value Ref Range   Specimen Description BLOOD LEFT ANTECUBITAL    Special Requests      BOTTLES DRAWN AEROBIC AND ANAEROBIC Blood Culture results may not be optimal due to an inadequate volume of blood received in culture bottles   Culture      NO GROWTH < 12 HOURS Performed at [facility], [address] [place], [place] [address]    Report Status PENDING   Lipase, blood     Status: Abnormal   Collection Time: 10/02/19  9:45 PM  Result Value Ref Range   Lipase 145 (H) 11 - 51 U/L  Magnesium     Status: None   Collection Time: 10/02/19  9:45 PM  Result Value Ref Range   Magnesium 1.7 1.7 - 2.4 mg/dL  Blood culture (routine x 2)     Status: None (Preliminary result)   Collection Time: 10/02/19  9:48 PM   Specimen: BLOOD  Result Value Ref Range   Specimen Description BLOOD BLOOD LEFT ARM    Special Requests      BOTTLES DRAWN AEROBIC AND ANAEROBIC Blood Culture adequate volume   Culture      NO GROWTH < 12 HOURS Performed at [facility], [address] [place], [place] [address]    Report Status PENDING   SARS Coronavirus 2 by RT PCR (hospital order, performed in [place] hospital lab) Nasopharyngeal Nasopharyngeal Swab     Status: None   Collection Time: 10/02/19 11:48 PM   Specimen: Nasopharyngeal Swab  Result Value Ref Range   SARS Coronavirus 2 NEGATIVE NEGATIVE  Lactic acid, plasma     Status: Abnormal   Collection Time: 10/03/19  1:00 AM  Result Value Ref Range   Lactic Acid, Venous 4.0 (HH) 0.5 - 1.9 mmol/L  Troponin I  (High Sensitivity)     Status: Abnormal   Collection Time: 10/03/19  1:00 AM  Result Value Ref Range   Troponin I (High Sensitivity) 69 (H) <18 ng/L  Basic metabolic panel     Status: Abnormal   Collection Time: 10/03/19  1:50 AM  Result Value Ref Range   Sodium 129 (L) 135 - 145 mmol/L   Potassium 3.5 3.5 - 5.1 mmol/L   Chloride 100 98 - 111 mmol/L   CO2 17 (L) 22 - 32 mmol/L   Glucose, Bld 167 (H) 70 - 99  mg/dL   BUN 21 8 - 23 mg/dL   Creatinine, Ser 1.66 (H) 0.44 - 1.00 mg/dL   Calcium 7.6 (L) 8.9 - 10.3 mg/dL   GFR calc non Af Amer 32 (L) >60 mL/min   GFR calc Af Amer 37 (L) >60 mL/min   Anion gap 12 5 - 15  CBC     Status: Abnormal   Collection Time: 10/03/19  1:50 AM  Result Value Ref Range   WBC 14.4 (H) 4.0 - 10.5 K/uL   RBC 4.47 3.87 - 5.11 MIL/uL   Hemoglobin 10.0 (L) 12.0 - 15.0 g/dL   HCT 33.7 (L) 36.0 - 46.0 %   MCV 75.4 (L) 80.0 - 100.0 fL   MCH 22.4 (L) 26.0 - 34.0 pg   MCHC 29.7 (L) 30.0 - 36.0 g/dL   RDW 17.5 (H) 11.5 - 15.5 %   Platelets 447 (H) 150 - 400 K/uL   nRBC 0.0 0.0 - 0.2 %  Protime-INR     Status: None   Collection Time: 10/03/19  1:50 AM  Result Value Ref Range   Prothrombin Time 15.0 11.4 - 15.2 seconds   INR 1.2 0.8 - 1.2  APTT     Status: None   Collection Time: 10/03/19  1:50 AM  Result Value Ref Range   aPTT 29 24 - 36 seconds  Lactic acid, plasma     Status: Abnormal   Collection Time: 10/03/19  1:50 AM  Result Value Ref Range   Lactic Acid, Venous 4.4 (HH) 0.5 - 1.9 mmol/L  C Difficile Quick Screen w PCR reflex     Status: None   Collection Time: 10/03/19  4:08 AM   Specimen: STOOL  Result Value Ref Range   C Diff antigen NEGATIVE NEGATIVE   C Diff toxin NEGATIVE NEGATIVE   C Diff interpretation No C. difficile detected.   Gastrointestinal Panel by PCR , Stool     Status: None   Collection Time: 10/03/19  4:09 AM   Specimen: STOOL  Result Value Ref Range   Campylobacter species NOT DETECTED NOT DETECTED   Plesimonas  shigelloides NOT DETECTED NOT DETECTED   Salmonella species NOT DETECTED NOT DETECTED   Yersinia enterocolitica NOT DETECTED NOT DETECTED   Vibrio species NOT DETECTED NOT DETECTED   Vibrio cholerae NOT DETECTED NOT DETECTED   Enteroaggregative E coli (EAEC) NOT DETECTED NOT DETECTED   Enteropathogenic E coli (EPEC) NOT DETECTED NOT DETECTED   Enterotoxigenic E coli (ETEC) NOT DETECTED NOT DETECTED   Shiga like toxin producing E coli (STEC) NOT DETECTED NOT DETECTED   Shigella/Enteroinvasive E coli (EIEC) NOT DETECTED NOT DETECTED   Cryptosporidium NOT DETECTED NOT DETECTED   Cyclospora cayetanensis NOT DETECTED NOT DETECTED   Entamoeba histolytica NOT DETECTED NOT DETECTED   Giardia lamblia NOT DETECTED NOT DETECTED   Adenovirus F40/41 NOT DETECTED NOT DETECTED   Astrovirus NOT DETECTED NOT DETECTED   Norovirus GI/GII NOT DETECTED NOT DETECTED   Rotavirus A NOT DETECTED NOT DETECTED   Sapovirus (I, II, IV, and V) NOT DETECTED NOT DETECTED  Occult blood card to lab, stool     Status: Abnormal   Collection Time: 10/03/19  4:09 AM  Result Value Ref Range   Fecal Occult Bld POSITIVE (A) NEGATIVE    Imaging: DG Chest 1 View  Result Date: 10/02/2019 CLINICAL DATA:  Altered mental status. Vomiting. Right shoulder surgery 2 days ago. EXAM: CHEST  1 VIEW COMPARISON:  Chest radiograph 07/01/2019. Shoulder radiograph 09/30/2019 FINDINGS:  Low lung volumes. Unchanged heart size and mediastinal contours. Streaky atelectasis in the lung bases. No confluent airspace disease. No pleural fluid or pneumothorax. Right shoulder arthroplasty, alignment not well assessed on this supine chest radiograph, however the humeral component is not definitively aligned with the glenoid. Right shoulder skin staples are noted. IMPRESSION: 1. Low lung volumes with streaky atelectasis in the lung bases. 2. Recent right shoulder arthroplasty. Glenohumeral alignment is not well assessed on this supine chest radiograph,  however the humeral arthroplasty is not definitively aligned with the glenoid. Recommend dedicated shoulder radiograph for further evaluation. Electronically Signed   By: Keith Rake M.D.   On: 10/02/2019 22:11   DG Shoulder Right  Result Date: 10/03/2019 CLINICAL DATA:  Right shoulder dislocation EXAM: RIGHT SHOULDER - 2+ VIEW COMPARISON:  Sep 30, 2019 FINDINGS: The patient is status post right shoulder arthroplasty. There is an anterior dislocation of the humeral head. Overlying soft tissue swelling seen. Surgical staples are noted. IMPRESSION: Anterior dislocation of right shoulder arthroplasty. Electronically Signed   By: Prudencio Pair M.D.   On: 10/03/2019 01:50   DG Abdomen 1 View  Result Date: 10/03/2019 CLINICAL DATA:  Abdominal pain EXAM: ABDOMEN - 1 VIEW COMPARISON:  Oct 03, 2019 FINDINGS: Tip of the NG tube is seen within the upper portion of the film with the tip just entering the GE junction. A mildly dilated air-filled loop of bowel seen within the mid abdomen. Stool seen down to the level of the rectum. IMPRESSION: Tip the NG tube just seen entering the GE junction. Electronically Signed   By: Prudencio Pair M.D.   On: 10/03/2019 01:29   CT ABDOMEN PELVIS W CONTRAST  Result Date: 10/02/2019 CLINICAL DATA:  Abdominal pain and vomiting. Right shoulder surgery 2 days ago. EXAM: CT ABDOMEN AND PELVIS WITH CONTRAST TECHNIQUE: Multidetector CT imaging of the abdomen and pelvis was performed using the standard protocol following bolus administration of intravenous contrast. CONTRAST:  81mL OMNIPAQUE IOHEXOL 300 MG/ML  SOLN COMPARISON:  Radiograph earlier this day. Abdominal CT 02/27/2018, chest abdomen pelvis CTA 07/01/2019 FINDINGS: Lower chest: Lung bases are clear. Hepatobiliary: Post cholecystectomy. There is intra and extrahepatic biliary ductal dilatation. The common bile duct measures approximately 13 mm at the porta hepatis. No obvious choledocholithiasis. No focal hepatic lesion.  Pancreas: No ductal dilatation or inflammation. Spleen: Normal in size without focal abnormality. Adrenals/Urinary Tract: Mild left adrenal thickening. No evidence of adrenal nodule. No hydronephrosis or perinephric edema. Homogeneous renal enhancement. There is absent excretion from both kidneys on delayed phase imaging. Urinary bladder is physiologically distended without wall thickening. Stomach/Bowel: Fluid within the distal esophagus. Fluid-filled stomach. Mild Peri pyloric gastric wall thickening. Normal positioning of the ligament of Treitz. Fluid-filled small bowel that is not abnormally dilated. Small amount of mesenteric edema in the right lower quadrant. Air and liquid stool within the cecum. The ascending, transverse, and descending colon are dilated and fluid-filled. Gradual transition to less dilated sigmoid colon with liquid and solid stool. There is solid stool within the distal sigmoid colon. No colonic wall thickening or inflammation. There is no bowel pneumatosis. Vascular/Lymphatic: Aorto bi-iliac atherosclerosis. No aortic aneurysm. The portal vein is patent. No adenopathy. Reproductive: Uterus not seen, presumably surgically absent. Ovaries are quiescent. Other: Trace free fluid in the pelvis.  No free intra-abdominal air. 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 Facet hypertrophy throughout the lumbar spine. Kyphoplasty within lower thoracic vertebral compression fractures. There are no acute or suspicious osseous  abnormalities. IMPRESSION: 1. Findings consistent with generalized ileus and slow transit. Fluid/liquid stool throughout the majority of the colon with gradual transition to formed stool in the sigmoid. Fluid-filled small bowel and stomach. No transition point or obstruction. There is minimal mesenteric edema but no definite wall thickening or bowel inflammation. 2. Intra and extrahepatic biliary ductal dilatation postcholecystectomy,  progressed from February 2021 exam. Recommend correlation with LFTs. If LFTs are normal, no further imaging follow-up is needed. If LFTs are elevated, recommend further evaluation with ERCP or MRCP, MRCP should only be considered if patient is able to tolerate breath hold technique. 3. Absent excretion from both kidneys on delayed phase imaging suggest underlying renal dysfunction. Aortic Atherosclerosis (ICD10-I70.0). Electronically Signed   By: Keith Rake M.D.   On: 10/02/2019 23:38   DG Abd Portable 1 View  Result Date: 10/02/2019 CLINICAL DATA:  Hypotension and vomiting EXAM: PORTABLE ABDOMEN - 1 VIEW COMPARISON:  None. FINDINGS: There is a mildly dilated air-filled loop of probable cecum seen within the right lower quadrant. There is air seen down to the level of the rectum. IMPRESSION: Mildly dilated air-filled loop of bowel in the right lower quadrant, likely cecum which could be due to ileus versus partial bowel obstruction. Electronically Signed   By: Prudencio Pair M.D.   On: 10/02/2019 22:35    Assessment and Plan: This is a 69 y.o. female with diffuse postop ileus status post right shoulder arthroplasty.  -The patient still having significant NG output with 700 mL recorded today.  Although she did have a large bowel movement in the emergency room overnight, I think it is prudent to keep the NG tube in place to suction today.  Repeat KUB ordered for the morning. -Continue aggressive IV fluid hydration.  Her lactic acidosis is improving as well as her hypokalemia. -Currently no surgical plans from the general surgery standpoint.   Face-to-face time spent with the patient and care providers was 55 minutes, with more than 50% of the time spent counseling, educating, and coordinating care of the patient.     Melvyn Neth, MD [facility] Pg:  [phone]

## 2019-10-03 NOTE — ED Notes (Signed)
Lab notified of need for venipuncture assist for lactic acid and 0500 labs.

## 2019-10-03 NOTE — Progress Notes (Signed)
Made dr. Jamse Arn aware cdiff results are negative as well as GI panel, per md discontinue enteric precautions

## 2019-10-03 NOTE — ED Notes (Signed)
Pt cool, clammy, pt with response, eye opening to verbal stimuli. Pt with 2+ radial pulses. See vital signs, pt currently on zoll to monitor.

## 2019-10-03 NOTE — ED Notes (Signed)
Iv in left ac is not infiltrated, fluids and medication stopped.

## 2019-10-03 NOTE — Progress Notes (Signed)
Per xray recommendation. Ng tube was advance 9cm. Patient tolerated well. Repeat xray ordered to verify placement.

## 2019-10-03 NOTE — ED Notes (Signed)
Ng tube advanced 10cm.

## 2019-10-03 NOTE — ED Notes (Signed)
Pt with approx one liter of liquid brown stool. Sample sent to lab.

## 2019-10-03 NOTE — ED Notes (Signed)
Dr mansy at bedside 

## 2019-10-03 NOTE — ED Notes (Signed)
Report to michele, rn.

## 2019-10-03 NOTE — Progress Notes (Signed)
PHARMACY -  BRIEF ANTIBIOTIC NOTE   Pharmacy has received consult(s) for Vancomycin and Cefepime from an ED provider.  The patient's profile has been reviewed for ht/wt/allergies/indication/available labs.    One time order(s) placed for Cefepime 2gm and Vancomycin1gm  Further antibiotics/pharmacy consults should be ordered by admitting physician if indicated.                       Thank you, Hart Robinsons A 10/03/2019  12:04 AM

## 2019-10-03 NOTE — Progress Notes (Signed)
CODE SEPSIS - PHARMACY COMMUNICATION  **Broad Spectrum Antibiotics should be administered within 1 hour of Sepsis diagnosis**  Time Code Sepsis Called/Page Received: 2242  Antibiotics Ordered: Cefepime and Vancomycin  Time of 1st antibiotic administration: 2256  Additional action taken by pharmacy: n/a  If necessary, Name of Provider/Nurse Contacted: n/a    Ena Dawley ,PharmD Clinical Pharmacist  10/03/2019  12:03 AM

## 2019-10-03 NOTE — Progress Notes (Signed)
5/30 Code Sepsis patient. Second LA delayed due to lack of access at this time. To restart, obtain US and blood draw.

## 2019-10-03 NOTE — Progress Notes (Addendum)
Pharmacy Antibiotic Note  Sydney Evans is a 69 y.o. female admitted on 10/02/2019 with sepsis.  Pharmacy has been consulted for Cefepime and Vancomycin dosing.  Plan: Cefepime 2gm IV q12hrs  Vancomycin 1000 mg IV Q 36 hrs. Goal AUC 400-550. Expected AUC: 472.7, Css min 12 SCr used: 1.66 Pt did not receive a full loading dose so will give next dose a bit early.     Temp (24hrs), Avg:97.7 F (36.5 C), Min:97.7 F (36.5 C), Max:97.7 F (36.5 C)  Recent Labs  Lab 09/30/19 0852 10/01/19 0604 10/02/19 2145 10/03/19 0100 10/03/19 0150  WBC  --  6.1 14.0*  --  14.4*  CREATININE 0.70 0.63 1.37*  --  1.66*  LATICACIDVEN  --   --  4.4* 4.0* 4.4*    Estimated Creatinine Clearance: 31.4 mL/min (A) (by C-G formula based on SCr of 1.66 mg/dL (H)).    Allergies  Allergen Reactions  . Hydroxyzine Hives and Rash  . Gabapentin     Dizzy and confusion  . Hydrocodone Hives and Rash    "terrible scratching"  Make take with benadryl  . Ketorolac Rash     May take with benadryl  . Percocet [Oxycodone-Acetaminophen] Itching and Rash    May take with benadryl    . Toradol [Ketorolac Tromethamine] Rash    May take with benadryl    Antimicrobials this admission:   >>    >>   Dose adjustments this admission:   Microbiology results:  BCx:   UCx:    Sputum:    MRSA PCR:   Thank you for allowing pharmacy to be a part of this patient's care.  Hart Robinsons A 10/03/2019 3:31 AM

## 2019-10-03 NOTE — ED Notes (Signed)
Pt with hr down to 24. Pt placed on zoll to monitor, blood pressure low. md no tified.

## 2019-10-03 NOTE — ED Notes (Addendum)
Pt more alert, skin color improving, skin no longer cool and clammy. Pt moans, states "do something for me" and vomits on self intermittenly. Ct tech her to take pt to ct, cleansing pt of vomit at this time.

## 2019-10-03 NOTE — Progress Notes (Signed)
PROGRESS NOTE    Sydney Evans  [id number]  DOB: 1950/12/05  DOA: 10/02/2019 PCP: Patient, No Pcp Per Outpatient Specialists:   Hospital course:  Sydney Evans is a 69 y.o. female admitted overnight to the hospitalist team with diffuse ileus.  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 Roland Rack.  Patient reports that since she got home, she started having abdominal distention, which progressed into nausea and vomiting.  She also reports abdominal pain but denies any fevers, chills, chest pain, shortness of breath.  In the emergency room, she was noted to be in AKI with a creatinine of 1.37 from prior of 0.63, severe hypokalemia with potassium of 2.2, lactic acidosis of 4.4, and WBC of 14.  She had a CT scan of the abdomen pelvis showing generalized ileus with no transition point or evidence of bowel obstruction. The patient was empirically given IV cefepime and vancomycin.  Subjective:  Patient states she feels sick.  Notes her shoulder/arm is bothering her terribly.  Feels very weak.   Objective: Vitals:   10/03/19 0801 10/03/19 1143 10/03/19 1152 10/03/19 1510  BP: (!) 126/44 126/60  122/71  Pulse: 66 69  68  Resp: 17 17  17   Temp: 99.3 F (37.4 C) 98.6 F (37 C)  98.9 F (37.2 C)  TempSrc: Oral Oral    SpO2: 97% 98%  96%  Weight:      Height:   5\' 3"  (1.6 m)     Intake/Output Summary (Last 24 hours) at 10/03/2019 1747 Last data filed at 10/03/2019 1531 Gross per 24 hour  Intake 7228.96 ml  Output 1601 ml  Net 5627.96 ml   Filed Weights   10/03/19 0524  Weight: 76.2 kg     Exam:  General: Acutely ill-appearing female lying in bed looking quite uncomfortable with NG tube in place. Eyes: sclera anicteric, conjuctiva mild injection bilaterally CVS: S1-S2, regular  Respiratory:  decreased air entry bilaterally secondary to decreased inspiratory effort, rales at bases  GI: NG tube in place, absent to diminished bowel sounds,  slightly distended but not hard, diffusely tender to light palpation, rectal tube in place draining dark brown liquid stool with red tinge. LE: No edema.    Assessment & Plan:   Generalized ileus Appreciate general surgery consultation and suggestion for conservative management NG tube decompression with significant output. rectal tube in place is draining fair amount of liquid stool Continue n.p.o., IV hydration, symptomatic management of nausea  Lactic acidosis Improving with hydration and broad-spectrum antibiotics. No obvious etiology although she does have leukocytosis and hypotension all of which might be secondary to her ileus. Continue vancomycin cefepime and Flagyl for unknown source at this time.  Reddish-colored stool Stool is guaiac positive however H&H have remained stable. BP within normal limits with hydration Follow H&H closely  Right shoulder dislocation And by orthopedics who are recommending open reduction. At present she is too sick to tolerate anesthesia, will reassess in the morning.  Bradycardia Thought to be secondary to vasovagal reaction, this is improved with hydration.  Hypokalemia Secondary to nausea and vomiting.   Likely contributing to ileus. Been repleted and normalized.  Hypertension. We will hold off HCTZ and Zestril given her initial hypotension.  Bipolar disorder. We will continue Lamictal.  GERD. We will continue PPI therapy.   DVT prophylaxis: Lovenox Code Status: DNR Family Communication: Patient's niece [name] was at bedside throughout Disposition Plan:   Patient is from: Home  Anticipated Discharge Location: TBD  Barriers to Discharge: Acutely ill  Is patient medically stable for Discharge: No   Consultants:  Orthopedics  General surgery  Procedures:  None yet  Antimicrobials:  Vancomycin  Cefepime  Flagyl   Data Reviewed:  Basic Metabolic Panel: Recent Labs  Lab 09/30/19 0852 10/01/19 0604  10/02/19 2145 10/03/19 0150 10/03/19 1046  NA 136 134* 131* 129* 131*  K 3.7 3.4* 2.2* 3.5 4.2  CL 102 106 106 100 105  CO2  --  23 14* 17* 14*  GLUCOSE 77 100* 167* 167* 143*  BUN 8 9 15 21  25*  CREATININE 0.70 0.63 1.37* 1.66* 1.39*  CALCIUM  --  8.1* 7.1* 7.6* 7.7*  MG  --   --  1.7  --  2.4   Liver Function Tests: Recent Labs  Lab 10/02/19 2145  AST 78*  ALT 22  ALKPHOS 66  BILITOT 0.7  PROT 5.4*  ALBUMIN 2.6*   Recent Labs  Lab 10/02/19 2145  LIPASE 145*   No results for input(s): AMMONIA in the last 168 hours. CBC: Recent Labs  Lab 09/30/19 0852 10/01/19 0604 10/02/19 2145 10/03/19 0150 10/03/19 1046  WBC  --  6.1 14.0* 14.4* 14.1*  NEUTROABS  --   --  12.6*  --   --   HGB 10.9* 8.9* 8.9* 10.0* 11.2*  HCT 32.0* 29.6* 29.5* 33.7* 35.9*  MCV  --  75.1* 74.9* 75.4* 71.5*  PLT  --  380 402* 447* 421*   Cardiac Enzymes: No results for input(s): CKTOTAL, CKMB, CKMBINDEX, TROPONINI in the last 168 hours. BNP (last 3 results) No results for input(s): PROBNP in the last 8760 hours. CBG: No results for input(s): GLUCAP in the last 168 hours.  Recent Results (from the past 240 hour(s))  Surgical pcr screen     Status: None   Collection Time: 09/24/19 12:52 PM   Specimen: Nasal Mucosa; Nasal Swab  Result Value Ref Range Status   MRSA, PCR NEGATIVE NEGATIVE Final   Staphylococcus aureus NEGATIVE NEGATIVE Final    Comment: (NOTE) The Xpert SA Assay (FDA approved for NASAL specimens in patients 24 years of age and older), is one component of a comprehensive surveillance program. It is not intended to diagnose infection nor to guide or monitor treatment. Performed at [facility], [place], [place] [address]   SARS CORONAVIRUS 2 (TAT 6-24 HRS) Nasopharyngeal Nasopharyngeal Swab     Status: None   Collection Time: 09/28/19 10:37 AM   Specimen: Nasopharyngeal Swab  Result Value Ref Range Status   SARS Coronavirus 2 NEGATIVE NEGATIVE  Final    Comment: (NOTE) SARS-CoV-2 target nucleic acids are NOT DETECTED. The SARS-CoV-2 RNA is generally detectable in upper and lower respiratory specimens during the acute phase of infection. Negative results do not preclude SARS-CoV-2 infection, do not rule out co-infections with other pathogens, and should not be used as the sole basis for treatment or other patient management decisions. Negative results must be combined with clinical observations, patient history, and epidemiological information. The expected result is Negative. Fact Sheet for Patients: SugarRoll.be Fact Sheet for Healthcare Providers: https://www.woods-mathews.com/ This test is not yet approved or cleared by the [place] FDA and  has been authorized for detection and/or diagnosis of SARS-CoV-2 by FDA under an Emergency Use Authorization (EUA). This EUA will remain  in effect (meaning this test can be used) for the duration of the COVID-19 declaration under Section 56 4(b)(1) of the Act, 21 U.S.C. section 360bbb-3(b)(1), unless  the authorization is terminated or revoked sooner. Performed at [facility], [place] [address] [place], [place] [address]   Blood culture (routine x 2)     Status: None (Preliminary result)   Collection Time: 10/02/19  9:45 PM   Specimen: BLOOD  Result Value Ref Range Status   Specimen Description BLOOD LEFT ANTECUBITAL  Final   Special Requests   Final    BOTTLES DRAWN AEROBIC AND ANAEROBIC Blood Culture results may not be optimal due to an inadequate volume of blood received in culture bottles   Culture   Final    NO GROWTH < 12 HOURS Performed at [facility], [address] [place], [place] [address]    Report Status PENDING  Incomplete  Blood culture (routine x 2)     Status: None (Preliminary result)   Collection Time: 10/02/19  9:48 PM   Specimen: BLOOD  Result Value Ref Range Status   Specimen Description BLOOD  BLOOD LEFT ARM  Final   Special Requests   Final    BOTTLES DRAWN AEROBIC AND ANAEROBIC Blood Culture adequate volume   Culture   Final    NO GROWTH < 12 HOURS Performed at [facility], [address], [place], [place] [address]    Report Status PENDING  Incomplete  SARS Coronavirus 2 by RT PCR (hospital order, performed in [place] hospital lab) Nasopharyngeal Nasopharyngeal Swab     Status: None   Collection Time: 10/02/19 11:48 PM   Specimen: Nasopharyngeal Swab  Result Value Ref Range Status   SARS Coronavirus 2 NEGATIVE NEGATIVE Final    Comment: (NOTE) SARS-CoV-2 target nucleic acids are NOT DETECTED. The SARS-CoV-2 RNA is generally detectable in upper and lower respiratory specimens during the acute phase of infection. The lowest concentration of SARS-CoV-2 viral copies this assay can detect is 250 copies / mL. A negative result does not preclude SARS-CoV-2 infection and should not be used as the sole basis for treatment or other patient management decisions.  A negative result may occur with improper specimen collection / handling, submission of specimen other than nasopharyngeal swab, presence of viral mutation(s) within the areas targeted by this assay, and inadequate number of viral copies (<250 copies / mL). A negative result must be combined with clinical observations, patient history, and epidemiological information. Fact Sheet for Patients:   StrictlyIdeas.no Fact Sheet for Healthcare Providers: BankingDealers.co.za This test is not yet approved or cleared  by the [place] FDA and has been authorized for detection and/or diagnosis of SARS-CoV-2 by FDA under an Emergency Use Authorization (EUA).  This EUA will remain in effect (meaning this test can be used) for the duration of the COVID-19 declaration under Section 564(b)(1) of the Act, 21 U.S.C. section 360bbb-3(b)(1), unless the authorization is  terminated or revoked sooner. Performed at [facility], [place], [place] [address]   C Difficile Quick Screen w PCR reflex     Status: None   Collection Time: 10/03/19  4:08 AM   Specimen: STOOL  Result Value Ref Range Status   C Diff antigen NEGATIVE NEGATIVE Final   C Diff toxin NEGATIVE NEGATIVE Final   C Diff interpretation No C. difficile detected.  Final    Comment: Performed at [facility], [address] [place], [place] [address]  Gastrointestinal Panel by PCR , Stool     Status: None   Collection Time: 10/03/19  4:09 AM   Specimen: STOOL  Result Value Ref  Range Status   Campylobacter species NOT DETECTED NOT DETECTED Final   Plesimonas shigelloides NOT DETECTED NOT DETECTED Final   Salmonella species NOT DETECTED NOT DETECTED Final   Yersinia enterocolitica NOT DETECTED NOT DETECTED Final   Vibrio species NOT DETECTED NOT DETECTED Final   Vibrio cholerae NOT DETECTED NOT DETECTED Final   Enteroaggregative E coli (EAEC) NOT DETECTED NOT DETECTED Final   Enteropathogenic E coli (EPEC) NOT DETECTED NOT DETECTED Final   Enterotoxigenic E coli (ETEC) NOT DETECTED NOT DETECTED Final   Shiga like toxin producing E coli (STEC) NOT DETECTED NOT DETECTED Final   Shigella/Enteroinvasive E coli (EIEC) NOT DETECTED NOT DETECTED Final   Cryptosporidium NOT DETECTED NOT DETECTED Final   Cyclospora cayetanensis NOT DETECTED NOT DETECTED Final   Entamoeba histolytica NOT DETECTED NOT DETECTED Final   Giardia lamblia NOT DETECTED NOT DETECTED Final   Adenovirus F40/41 NOT DETECTED NOT DETECTED Final   Astrovirus NOT DETECTED NOT DETECTED Final   Norovirus GI/GII NOT DETECTED NOT DETECTED Final   Rotavirus A NOT DETECTED NOT DETECTED Final   Sapovirus (I, II, IV, and V) NOT DETECTED NOT DETECTED Final    Comment: Performed at [facility], [address] [place], [place] [address]      Studies: DG Chest 1 View  Result Date:  10/02/2019 CLINICAL DATA:  Altered mental status. Vomiting. Right shoulder surgery 2 days ago. EXAM: CHEST  1 VIEW COMPARISON:  Chest radiograph 07/01/2019. Shoulder radiograph 09/30/2019 FINDINGS: Low lung volumes. Unchanged heart size and mediastinal contours. Streaky atelectasis in the lung bases. No confluent airspace disease. No pleural fluid or pneumothorax. Right shoulder arthroplasty, alignment not well assessed on this supine chest radiograph, however the humeral component is not definitively aligned with the glenoid. Right shoulder skin staples are noted. IMPRESSION: 1. Low lung volumes with streaky atelectasis in the lung bases. 2. Recent right shoulder arthroplasty. Glenohumeral alignment is not well assessed on this supine chest radiograph, however the humeral arthroplasty is not definitively aligned with the glenoid. Recommend dedicated shoulder radiograph for further evaluation. Electronically Signed   By: Keith Rake M.D.   On: 10/02/2019 22:11   DG Shoulder Right  Result Date: 10/03/2019 CLINICAL DATA:  Right shoulder dislocation EXAM: RIGHT SHOULDER - 2+ VIEW COMPARISON:  Sep 30, 2019 FINDINGS: The patient is status post right shoulder arthroplasty. There is an anterior dislocation of the humeral head. Overlying soft tissue swelling seen. Surgical staples are noted. IMPRESSION: Anterior dislocation of right shoulder arthroplasty. Electronically Signed   By: Prudencio Pair M.D.   On: 10/03/2019 01:50   DG Abdomen 1 View  Result Date: 10/03/2019 CLINICAL DATA:  Abdominal pain EXAM: ABDOMEN - 1 VIEW COMPARISON:  Oct 03, 2019 FINDINGS: Tip of the NG tube is seen within the upper portion of the film with the tip just entering the GE junction. A mildly dilated air-filled loop of bowel seen within the mid abdomen. Stool seen down to the level of the rectum. IMPRESSION: Tip the NG tube just seen entering the GE junction. Electronically Signed   By: Prudencio Pair M.D.   On: 10/03/2019 01:29   CT  ABDOMEN PELVIS W CONTRAST  Result Date: 10/02/2019 CLINICAL DATA:  Abdominal pain and vomiting. Right shoulder surgery 2 days ago. EXAM: CT ABDOMEN AND PELVIS WITH CONTRAST TECHNIQUE: Multidetector CT imaging of the abdomen and pelvis was performed using the standard protocol following bolus administration of intravenous contrast. CONTRAST:  41mL OMNIPAQUE IOHEXOL 300 MG/ML  SOLN COMPARISON:  Radiograph earlier this day. Abdominal CT 02/27/2018, chest abdomen pelvis CTA 07/01/2019 FINDINGS: Lower chest: Lung bases are clear. Hepatobiliary: Post cholecystectomy. There is intra and extrahepatic biliary ductal dilatation. The common bile duct measures approximately 13 mm at the porta hepatis. No obvious choledocholithiasis. No focal hepatic lesion. Pancreas: No ductal dilatation or inflammation. Spleen: Normal in size without focal abnormality. Adrenals/Urinary Tract: Mild left adrenal thickening. No evidence of adrenal nodule. No hydronephrosis or perinephric edema. Homogeneous renal enhancement. There is absent excretion from both kidneys on delayed phase imaging. Urinary bladder is physiologically distended without wall thickening. Stomach/Bowel: Fluid within the distal esophagus. Fluid-filled stomach. Mild Peri pyloric gastric wall thickening. Normal positioning of the ligament of Treitz. Fluid-filled small bowel that is not abnormally dilated. Small amount of mesenteric edema in the right lower quadrant. Air and liquid stool within the cecum. The ascending, transverse, and descending colon are dilated and fluid-filled. Gradual transition to less dilated sigmoid colon with liquid and solid stool. There is solid stool within the distal sigmoid colon. No colonic wall thickening or inflammation. There is no bowel pneumatosis. Vascular/Lymphatic: Aorto bi-iliac atherosclerosis. No aortic aneurysm. The portal vein is patent. No adenopathy. Reproductive: Uterus not seen, presumably surgically absent. Ovaries are  quiescent. Other: Trace free fluid in the pelvis.  No free intra-abdominal air. 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 Facet hypertrophy throughout the lumbar spine. Kyphoplasty within lower thoracic vertebral compression fractures. There are no acute or suspicious osseous abnormalities. IMPRESSION: 1. Findings consistent with generalized ileus and slow transit. Fluid/liquid stool throughout the majority of the colon with gradual transition to formed stool in the sigmoid. Fluid-filled small bowel and stomach. No transition point or obstruction. There is minimal mesenteric edema but no definite wall thickening or bowel inflammation. 2. Intra and extrahepatic biliary ductal dilatation postcholecystectomy, progressed from February 2021 exam. Recommend correlation with LFTs. If LFTs are normal, no further imaging follow-up is needed. If LFTs are elevated, recommend further evaluation with ERCP or MRCP, MRCP should only be considered if patient is able to tolerate breath hold technique. 3. Absent excretion from both kidneys on delayed phase imaging suggest underlying renal dysfunction. Aortic Atherosclerosis (ICD10-I70.0). Electronically Signed   By: Keith Rake M.D.   On: 10/02/2019 23:38   DG Abd Portable 1 View  Result Date: 10/02/2019 CLINICAL DATA:  Hypotension and vomiting EXAM: PORTABLE ABDOMEN - 1 VIEW COMPARISON:  None. FINDINGS: There is a mildly dilated air-filled loop of probable cecum seen within the right lower quadrant. There is air seen down to the level of the rectum. IMPRESSION: Mildly dilated air-filled loop of bowel in the right lower quadrant, likely cecum which could be due to ileus versus partial bowel obstruction. Electronically Signed   By: Prudencio Pair M.D.   On: 10/02/2019 22:35     Scheduled Meds: . Chlorhexidine Gluconate Cloth  6 each Topical Daily  . citalopram  40 mg Oral Daily  . enoxaparin (LOVENOX) injection  40 mg Subcutaneous  Q24H  . lamoTRIgine  200 mg Oral Daily  . latanoprost  1 drop Both Eyes QHS  . pantoprazole  40 mg Oral Daily  . potassium chloride  40 mEq Oral Once   Continuous Infusions: . sodium chloride 100 mL/hr at 10/03/19 1531  . ceFEPime (MAXIPIME) IV Stopped (10/03/19 1034)  . metronidazole 500 mg (10/03/19 1711)  . vancomycin      Active Problems:   Ileus ([place])   Hypotension     Kavin Weckwerth Derek Jack,  [facility]  If 7PM-7AM, please contact night-coverage [email or web address] Password [email or web address] 10/03/2019, 5:47 PM    LOS: 0 days

## 2019-10-03 NOTE — ED Notes (Signed)
Pt placed on nrb at 15lpm due to sat of 90% and hypotension, bradycardia.

## 2019-10-04 ENCOUNTER — Inpatient Hospital Stay: Payer: Medicare Other

## 2019-10-04 LAB — BLOOD GAS, ARTERIAL
Acid-base deficit: 7.5 mmol/L — ABNORMAL HIGH (ref 0.0–2.0)
Allens test (pass/fail): POSITIVE — AB
Bicarbonate: 15.5 mmol/L — ABNORMAL LOW (ref 20.0–28.0)
FIO2: 0.21
O2 Saturation: 96.8 %
Patient temperature: 37
pCO2 arterial: 25 mmHg — ABNORMAL LOW (ref 32.0–48.0)
pH, Arterial: 7.4 (ref 7.350–7.450)
pO2, Arterial: 89 mmHg (ref 83.0–108.0)

## 2019-10-04 LAB — URINALYSIS, COMPLETE (UACMP) WITH MICROSCOPIC
Bacteria, UA: NONE SEEN
Bilirubin Urine: NEGATIVE
Glucose, UA: NEGATIVE mg/dL
Ketones, ur: NEGATIVE mg/dL
Nitrite: NEGATIVE
Protein, ur: 30 mg/dL — AB
Specific Gravity, Urine: 1.018 (ref 1.005–1.030)
Squamous Epithelial / HPF: NONE SEEN (ref 0–5)
pH: 6 (ref 5.0–8.0)

## 2019-10-04 LAB — BASIC METABOLIC PANEL
Anion gap: 10 (ref 5–15)
BUN: 27 mg/dL — ABNORMAL HIGH (ref 8–23)
CO2: 15 mmol/L — ABNORMAL LOW (ref 22–32)
Calcium: 7.4 mg/dL — ABNORMAL LOW (ref 8.9–10.3)
Chloride: 110 mmol/L (ref 98–111)
Creatinine, Ser: 1.06 mg/dL — ABNORMAL HIGH (ref 0.44–1.00)
GFR calc Af Amer: >60 mL/min (ref >60)
GFR calc non Af Amer: 54 mL/min — ABNORMAL LOW (ref >60)
Glucose, Bld: 136 mg/dL — ABNORMAL HIGH (ref 70–99)
Potassium: 3.2 mmol/L — ABNORMAL LOW (ref 3.5–5.1)
Sodium: 135 mmol/L (ref 135–145)

## 2019-10-04 LAB — CBC
HCT: 29.8 % — ABNORMAL LOW (ref 36.0–46.0)
Hemoglobin: 9.3 g/dL — ABNORMAL LOW (ref 12.0–15.0)
MCH: 22.2 pg — ABNORMAL LOW (ref 26.0–34.0)
MCHC: 31.2 g/dL (ref 30.0–36.0)
MCV: 71.3 fL — ABNORMAL LOW (ref 80.0–100.0)
Platelets: 402 10*3/uL — ABNORMAL HIGH (ref 150–400)
RBC: 4.18 MIL/uL (ref 3.87–5.11)
RDW: 17.7 % — ABNORMAL HIGH (ref 11.5–15.5)
WBC: 11.1 10*3/uL — ABNORMAL HIGH (ref 4.0–10.5)
nRBC: 0 % (ref 0.0–0.2)

## 2019-10-04 LAB — C DIFFICILE QUICK SCREEN W PCR REFLEX
C Diff antigen: NEGATIVE
C Diff interpretation: NOT DETECTED
C Diff toxin: NEGATIVE

## 2019-10-04 LAB — POTASSIUM: Potassium: 2.8 mmol/L — ABNORMAL LOW (ref 3.5–5.1)

## 2019-10-04 LAB — MAGNESIUM: Magnesium: 2.3 mg/dL (ref 1.7–2.4)

## 2019-10-04 LAB — GLUCOSE, CAPILLARY: Glucose-Capillary: 89 mg/dL (ref 70–99)

## 2019-10-04 LAB — LACTOFERRIN, FECAL, QUALITATIVE: Lactoferrin, Fecal, Qual: NEGATIVE

## 2019-10-04 LAB — LACTIC ACID, PLASMA
Lactic Acid, Venous: 1.6 mmol/L (ref 0.5–1.9)
Lactic Acid, Venous: 1.6 mmol/L (ref 0.5–1.9)

## 2019-10-04 MED ORDER — ACETAMINOPHEN 10 MG/ML IV SOLN
1000.0000 mg | Freq: Four times a day (QID) | INTRAVENOUS | Status: AC
  Administered 2019-10-04 – 2019-10-05 (×4): 1000 mg via INTRAVENOUS
  Filled 2019-10-04 (×5): qty 100

## 2019-10-04 MED ORDER — VANCOMYCIN HCL IN DEXTROSE 1-5 GM/200ML-% IV SOLN
1000.0000 mg | INTRAVENOUS | Status: DC
  Administered 2019-10-05: 1000 mg via INTRAVENOUS
  Filled 2019-10-04 (×3): qty 200

## 2019-10-04 MED ORDER — POTASSIUM CHLORIDE 10 MEQ/100ML IV SOLN
10.0000 meq | INTRAVENOUS | Status: AC
  Administered 2019-10-04 (×4): 10 meq via INTRAVENOUS
  Filled 2019-10-04 (×4): qty 100

## 2019-10-04 NOTE — Progress Notes (Signed)
Pharmacy Antibiotic Note  Sydney Evans is a 69 y.o. female admitted on 10/02/2019 with sepsis.  Pharmacy has been consulted for Cefepime and Vancomycin dosing.  Plan: Continue Cefepime 2gm IV q12hrs  Adjust dose to Vancomycin 1000 mg IV Q 24 hrs. Goal AUC 400-550. Expected AUC: 458, Css min 11.9 SCr used: 1.06   Height: 5\' 3"  (160 cm) Weight: 76.2 kg (168 lb) IBW/kg (Calculated) : 52.4  Temp (24hrs), Avg:98.5 F (36.9 C), Min:98.2 F (36.8 C), Max:98.9 F (37.2 C)  Recent Labs  Lab 10/01/19 0604 10/02/19 2145 10/02/19 2145 10/03/19 0100 10/03/19 0150 10/03/19 1046 10/04/19 0530 10/04/19 0602 10/04/19 0848  WBC 6.1 14.0*  --   --  14.4* 14.1* 11.1*  --   --   CREATININE 0.63 1.37*  --   --  1.66* 1.39* 1.06*  --   --   LATICACIDVEN  --  4.4*   < > 4.0* 4.4* 2.7*  --  1.6 1.6   < > = values in this interval not displayed.    Estimated Creatinine Clearance: 50.3 mL/min (A) (by C-G formula based on SCr of 1.06 mg/dL (H)).    Allergies  Allergen Reactions  . Hydroxyzine Hives and Rash  . Gabapentin     Dizzy and confusion  . Hydrocodone Hives and Rash    "terrible scratching"  Make take with benadryl  . Ketorolac Rash     May take with benadryl  . Percocet [Oxycodone-Acetaminophen] Itching and Rash    May take with benadryl    . Toradol [Ketorolac Tromethamine] Rash    May take with benadryl    Antimicrobials this admission: 5/29 cefepime  >>   5/29 vancomycin  >>   Microbiology results:  BCx: NG TD   Thank you for allowing pharmacy to be a part of this patient's care.  Pernell Dupre, PharmD, BCPS Clinical Pharmacist 10/04/2019 12:47 PM

## 2019-10-04 NOTE — Consult Note (Signed)
ORTHOPAEDIC CONSULT NOTE ORTHOPAEDICS PROGRESS NOTE  PATIENT NAME: Sydney Evans DOB: 1951-03-18  MRN: [id number]  Subjective: The patient complains of some right shoulder pain.  She denies any numbness.  Objective: Vital signs in last 24 hours: Temp:  [98.2 F (36.8 C)-98.9 F (37.2 C)] 98.4 F (36.9 C) (05/31 0453) Pulse Rate:  [68-71] 68 (05/31 0746) Resp:  [17-20] 18 (05/31 0746) BP: (122-172)/(60-71) 136/63 (05/31 0746) SpO2:  [96 %-100 %] 100 % (05/31 0746)  Intake/Output from previous day: 05/30 0701 - 05/31 0700 In: 2839.1 [I.V.:2139.1; IV Piggyback:700] Out: 3225 [Urine:1875; Emesis/NG output:225; [id number]  Recent Labs    10/02/19 2145 10/02/19 2145 10/03/19 0150 10/03/19 0150 10/03/19 1046 10/04/19 0530  WBC 14.0*  --  14.4*  --  14.1* 11.1*  HGB 8.9*  --  10.0*  --  11.2* 9.3*  HCT 29.5*  --  33.7*  --  35.9* 29.8*  PLT 402*  --  447*  --  421* 402*  K 2.2*   < > 3.5   < > 4.2 3.2*  CL 106   < > 100   < > 105 110  CO2 14*   < > 17*   < > 14* 15*  BUN 15   < > 21   < > 25* 27*  CREATININE 1.37*   < > 1.66*   < > 1.39* 1.06*  GLUCOSE 167*   < > 167*   < > 143* 136*  CALCIUM 7.1*   < > 7.6*   < > 7.7* 7.4*  INR  --   --  1.2  --   --   --    < > = values in this interval not displayed.    EXAM General: Well-developed female in some discomfort.  NG tube is in place. Right upper extremity: Shoulder dressing is intact with scant bloody drainage.  Pain and guarding are elicited with any attempted range of motion of the right shoulder. Neurologic: Awake, alert, and oriented.  Sensory and motor function are grossly intact.  Assessment: Anterior dislocation of the right total shoulder arthroplasty  Plan: Awaiting medical and surgical optimization for consideration of closed versus open reduction of the right shoulder dislocation.  I discussed the possible need for surgical repair if the subscapularis tendon has been ruptured with the dislocation.  Dr.  Roland Rack will be returning tomorrow.  I would like to consider IV Tylenol for control of the shoulder pain.  Labrandon Knoch P. Holley Bouche M.D.

## 2019-10-04 NOTE — Progress Notes (Signed)
   10/04/19 1200  Clinical Encounter Type  Visited With Patient  Visit Type Follow-up;Spiritual support;Social support  Referral From Chaplain  Consult/Referral To Chaplain  Ch followed-up with Pt and family from Saturday night in ER. Ch provided support to the Pt and the family. Pt talked about her health. Pt informed me that she is doing a lot better. Pt has great family support. Ch Gerald Stabs is going to follow-up with Pt.

## 2019-10-04 NOTE — Progress Notes (Signed)
Daughter at bedside.

## 2019-10-04 NOTE — Progress Notes (Signed)
Have been waiting for hours for an ordered shoulder sling to be delivered to the unit from "materials". Has never arrived. Assumably they are out of stock, at least for today. I contacted [facility] and individual stated they don't ordinarily stock slings and that they have to order them from materials as well. Will pass along in shift report. Will continue to monitor R shoulder dressing / pain response for the remainder of the shift. Wenda Low [facility]

## 2019-10-04 NOTE — Progress Notes (Signed)
PROGRESS NOTE    Sydney Evans  [id number]  DOB: 1950-07-18  DOA: 10/02/2019 PCP: Patient, No Pcp Per Outpatient Specialists:   Hospital course:  Sydney Evans is a 69 y.o. female admitted overnight to the hospitalist team with diffuse ileus.  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 Roland Rack.  Patient reports that since she got home, she started having abdominal distention, which progressed into nausea and vomiting.  She also reports abdominal pain but denies any fevers, chills, chest pain, shortness of breath.  In the emergency room, she was noted to be in AKI with a creatinine of 1.37 from prior of 0.63, severe hypokalemia with potassium of 2.2, lactic acidosis of 4.4, and WBC of 14.  She had a CT scan of the abdomen pelvis showing generalized ileus with no transition point or evidence of bowel obstruction. The patient was empirically given IV cefepime and vancomycin.  Subjective:  Patient states that she feels much much better.  Her main problem is her ongoing pain of her shoulder.  Notes her energy is better and she has significantly decreased abdominal pain.  No further nausea and vomiting even though she is using ice chips.  Feels that she would be able to get up out of bed if her arm or not in so much pain.   Objective: Vitals:   10/04/19 0453 10/04/19 0746 10/04/19 1140 10/04/19 1625  BP: (!) 172/68 136/63 (!) 115/102 (!) 158/67  Pulse: 71 68 83 75  Resp: 20 18 20 18   Temp: 98.4 F (36.9 C)   98.8 F (37.1 C)  TempSrc: Oral   Oral  SpO2: 97% 100% 100% 99%  Weight:      Height:        Intake/Output Summary (Last 24 hours) at 10/04/2019 1658 Last data filed at 10/04/2019 1500 Gross per 24 hour  Intake 1510.14 ml  Output 2075 ml  Net -564.86 ml   Filed Weights   10/03/19 0524  Weight: 76.2 kg     Exam:  General: Patient looking much brighter, speaking coherently in full sentences.  Has NG tube in place draining somewhat  clear brownish fluid. Eyes: sclera anicteric, conjuctiva mild injection bilaterally CVS: S1-S2, regular  Respiratory:  decreased air entry bilaterally secondary to decreased inspiratory effort, rales at bases  GI: NG tube in place, she does have bowel sounds although they are diminished, abdomen is slightly distended but it is soft.  No tenderness to light palpation throughout.  No rebound tenderness.  No guarding. LE: No edema.    Assessment & Plan:   Generalized ileus Appreciate general surgery consultation and suggestion for conservative management NG tube decompression with significant output, possible discontinuation of NG tube tomorrow per general surgery. rectal tube in place is draining fair amount of liquid stool Continue n.p.o., IV hydration, symptomatic management of nausea  VT Patient had 11 beat run of VT, asymptomatic. She has acidosis and potassium is somewhat low although it has been repleted, repeat potassium pending.  Will check ABG to see what her pH is. Continue telemetry monitoring and continue to aggressively replete potassium. Magnesium is 2.3 and within normal limits. Will ask cardiology to evaluate patient in the morning re risk from 11 beat run of VT as patient will need surgery for her shoulder.  Mixed metabolic Acidosis Bicarb is falling significantly since admission most likely secondary to ongoing diarrhea/stool output Lactate is 1.7 We will check pH given 11 beat run of VT.  Hypokalemia  Potassium is being aggressively repleted, will recheck tonight and continue repletion  Secondary to nausea and vomiting.   Likely contributing to ileus.  Reddish-colored stool Stool is guaiac positive with reddish tinge. Patient is hemodynamically stable although H&H are drifting downwards, possibly secondary to dilution. Will need to follow carefully, will discontinue Lovenox and place patient on SCDs. BP within normal limits with hydration Follow H&H  closely  Right shoulder dislocation This is patient's biggest concern and complaint given significant pain. Will need open reduction per orthopedics when patient is stable to go to OR. Will request cardiology consultation in the morning to assess risk associated with 11 beat run of VT earlier today.  We will also need to follow H&H to make sure that she is not having an active GI bleed.  If H&H and hemodynamic parameters are stable and she has no further VT with normalized potassium and pH, patient can go for surgery tomorrow.  Bradycardia Thought to be secondary to vasovagal reaction, this is improved with hydration.  Hypertension. We will hold off HCTZ and Zestril given her initial hypotension.  Bipolar disorder. We will continue Lamictal.  GERD. We will continue PPI therapy.   DVT prophylaxis: SCD Code Status: DNR Family Communication: Patient's niece [name] was at bedside throughout Disposition Plan:   Patient is from: Home  Anticipated Discharge Location: TBD  Barriers to Discharge: Acutely ill  Is patient medically stable for Discharge: No   Consultants:  Orthopedics  General surgery  Procedures:  None yet  Antimicrobials:  Vancomycin  Cefepime  Flagyl   Data Reviewed:  Basic Metabolic Panel: Recent Labs  Lab 10/01/19 0604 10/02/19 2145 10/03/19 0150 10/03/19 1046 10/04/19 0530  NA 134* 131* 129* 131* 135  K 3.4* 2.2* 3.5 4.2 3.2*  CL 106 106 100 105 110  CO2 23 14* 17* 14* 15*  GLUCOSE 100* 167* 167* 143* 136*  BUN 9 15 21  25* 27*  CREATININE 0.63 1.37* 1.66* 1.39* 1.06*  CALCIUM 8.1* 7.1* 7.6* 7.7* 7.4*  MG  --  1.7  --  2.4 2.3   Liver Function Tests: Recent Labs  Lab 10/02/19 2145  AST 78*  ALT 22  ALKPHOS 66  BILITOT 0.7  PROT 5.4*  ALBUMIN 2.6*   Recent Labs  Lab 10/02/19 2145  LIPASE 145*   No results for input(s): AMMONIA in the last 168 hours. CBC: Recent Labs  Lab 10/01/19 0604 10/02/19 2145 10/03/19 0150  10/03/19 1046 10/04/19 0530  WBC 6.1 14.0* 14.4* 14.1* 11.1*  NEUTROABS  --  12.6*  --   --   --   HGB 8.9* 8.9* 10.0* 11.2* 9.3*  HCT 29.6* 29.5* 33.7* 35.9* 29.8*  MCV 75.1* 74.9* 75.4* 71.5* 71.3*  PLT 380 402* 447* 421* 402*   Cardiac Enzymes: No results for input(s): CKTOTAL, CKMB, CKMBINDEX, TROPONINI in the last 168 hours. BNP (last 3 results) No results for input(s): PROBNP in the last 8760 hours. CBG: No results for input(s): GLUCAP in the last 168 hours.  Recent Results (from the past 240 hour(s))  SARS CORONAVIRUS 2 (TAT 6-24 HRS) Nasopharyngeal Nasopharyngeal Swab     Status: None   Collection Time: 09/28/19 10:37 AM   Specimen: Nasopharyngeal Swab  Result Value Ref Range Status   SARS Coronavirus 2 NEGATIVE NEGATIVE Final    Comment: (NOTE) SARS-CoV-2 target nucleic acids are NOT DETECTED. The SARS-CoV-2 RNA is generally detectable in upper and lower respiratory specimens during the acute phase of infection. Negative results do not  preclude SARS-CoV-2 infection, do not rule out co-infections with other pathogens, and should not be used as the sole basis for treatment or other patient management decisions. Negative results must be combined with clinical observations, patient history, and epidemiological information. The expected result is Negative. Fact Sheet for Patients: SugarRoll.be Fact Sheet for Healthcare Providers: https://www.woods-mathews.com/ This test is not yet approved or cleared by the [place] FDA and  has been authorized for detection and/or diagnosis of SARS-CoV-2 by FDA under an Emergency Use Authorization (EUA). This EUA will remain  in effect (meaning this test can be used) for the duration of the COVID-19 declaration under Section 56 4(b)(1) of the Act, 21 U.S.C. section 360bbb-3(b)(1), unless the authorization is terminated or revoked sooner. Performed at [facility], [place] [address], [place], [place] [address]   Blood culture (routine x 2)     Status: None (Preliminary result)   Collection Time: 10/02/19  9:45 PM   Specimen: BLOOD  Result Value Ref Range Status   Specimen Description BLOOD LEFT ANTECUBITAL  Final   Special Requests   Final    BOTTLES DRAWN AEROBIC AND ANAEROBIC Blood Culture results may not be optimal due to an inadequate volume of blood received in culture bottles   Culture   Final    NO GROWTH 2 DAYS Performed at [facility], [address] [place], [place] [address]    Report Status PENDING  Incomplete  Blood culture (routine x 2)     Status: None (Preliminary result)   Collection Time: 10/02/19  9:48 PM   Specimen: BLOOD  Result Value Ref Range Status   Specimen Description BLOOD BLOOD LEFT ARM  Final   Special Requests   Final    BOTTLES DRAWN AEROBIC AND ANAEROBIC Blood Culture adequate volume   Culture   Final    NO GROWTH 2 DAYS Performed at [facility], [address], [place], [place] [address]    Report Status PENDING  Incomplete  SARS Coronavirus 2 by RT PCR (hospital order, performed in [facility] hospital lab) Nasopharyngeal Nasopharyngeal Swab     Status: None   Collection Time: 10/02/19 11:48 PM   Specimen: Nasopharyngeal Swab  Result Value Ref Range Status   SARS Coronavirus 2 NEGATIVE NEGATIVE Final    Comment: (NOTE) SARS-CoV-2 target nucleic acids are NOT DETECTED. The SARS-CoV-2 RNA is generally detectable in upper and lower respiratory specimens during the acute phase of infection. The lowest concentration of SARS-CoV-2 viral copies this assay can detect is 250 copies / mL. A negative result does not preclude SARS-CoV-2 infection and should not be used as the sole basis for treatment or other patient management decisions.  A negative result may occur with improper specimen collection / handling, submission of specimen other than nasopharyngeal swab, presence of viral mutation(s) within  the areas targeted by this assay, and inadequate number of viral copies (<250 copies / mL). A negative result must be combined with clinical observations, patient history, and epidemiological information. Fact Sheet for Patients:   StrictlyIdeas.no Fact Sheet for Healthcare Providers: BankingDealers.co.za This test is not yet approved or cleared  by the [place] FDA and has been authorized for detection and/or diagnosis of SARS-CoV-2 by FDA under an Emergency Use Authorization (EUA).  This EUA will remain in effect (meaning this test can be used) for the duration of the COVID-19 declaration under Section 564(b)(1) of the Act, 21 U.S.C. section 360bbb-3(b)(1), unless the authorization is terminated or revoked  sooner. Performed at [facility], [place], [place] [address]   C Difficile Quick Screen w PCR reflex     Status: None   Collection Time: 10/03/19  4:08 AM   Specimen: STOOL  Result Value Ref Range Status   C Diff antigen NEGATIVE NEGATIVE Final   C Diff toxin NEGATIVE NEGATIVE Final   C Diff interpretation No C. difficile detected.  Final    Comment: Performed at [facility], [address] [place], [place] [address]  Gastrointestinal Panel by PCR , Stool     Status: None   Collection Time: 10/03/19  4:09 AM   Specimen: STOOL  Result Value Ref Range Status   Campylobacter species NOT DETECTED NOT DETECTED Final   Plesimonas shigelloides NOT DETECTED NOT DETECTED Final   Salmonella species NOT DETECTED NOT DETECTED Final   Yersinia enterocolitica NOT DETECTED NOT DETECTED Final   Vibrio species NOT DETECTED NOT DETECTED Final   Vibrio cholerae NOT DETECTED NOT DETECTED Final   Enteroaggregative E coli (EAEC) NOT DETECTED NOT DETECTED Final   Enteropathogenic E coli (EPEC) NOT DETECTED NOT DETECTED Final   Enterotoxigenic E coli (ETEC) NOT DETECTED NOT DETECTED Final   Shiga like toxin  producing E coli (STEC) NOT DETECTED NOT DETECTED Final   Shigella/Enteroinvasive E coli (EIEC) NOT DETECTED NOT DETECTED Final   Cryptosporidium NOT DETECTED NOT DETECTED Final   Cyclospora cayetanensis NOT DETECTED NOT DETECTED Final   Entamoeba histolytica NOT DETECTED NOT DETECTED Final   Giardia lamblia NOT DETECTED NOT DETECTED Final   Adenovirus F40/41 NOT DETECTED NOT DETECTED Final   Astrovirus NOT DETECTED NOT DETECTED Final   Norovirus GI/GII NOT DETECTED NOT DETECTED Final   Rotavirus A NOT DETECTED NOT DETECTED Final   Sapovirus (I, II, IV, and V) NOT DETECTED NOT DETECTED Final    Comment: Performed at [facility], [address] [place], [place] [address]  C Difficile Quick Screen w PCR reflex     Status: None   Collection Time: 10/03/19  4:14 AM   Specimen: Stool  Result Value Ref Range Status   C Diff antigen NEGATIVE NEGATIVE Final   C Diff toxin NEGATIVE NEGATIVE Final   C Diff interpretation No C. difficile detected.  Final    Comment: Performed at [facility], [address] [place] [place],  [address]      Studies: DG Chest 1 View  Result Date: 10/02/2019 CLINICAL DATA:  Altered mental status. Vomiting. Right shoulder surgery 2 days ago. EXAM: CHEST  1 VIEW COMPARISON:  Chest radiograph 07/01/2019. Shoulder radiograph 09/30/2019 FINDINGS: Low lung volumes. Unchanged heart size and mediastinal contours. Streaky atelectasis in the lung bases. No confluent airspace disease. No pleural fluid or pneumothorax. Right shoulder arthroplasty, alignment not well assessed on this supine chest radiograph, however the humeral component is not definitively aligned with the glenoid. Right shoulder skin staples are noted. IMPRESSION: 1. Low lung volumes with streaky atelectasis in the lung bases. 2. Recent right shoulder arthroplasty. Glenohumeral alignment is not well assessed on this supine chest radiograph, however the humeral arthroplasty is not definitively  aligned with the glenoid. Recommend dedicated shoulder radiograph for further evaluation. Electronically Signed   By: Keith Rake M.D.   On: 10/02/2019 22:11   DG Shoulder Right  Result Date: 10/03/2019 CLINICAL DATA:  Right shoulder dislocation EXAM: RIGHT SHOULDER - 2+ VIEW COMPARISON:  Sep 30, 2019 FINDINGS: The patient is status post right shoulder arthroplasty. There is an  anterior dislocation of the humeral head. Overlying soft tissue swelling seen. Surgical staples are noted. IMPRESSION: Anterior dislocation of right shoulder arthroplasty. Electronically Signed   By: Prudencio Pair M.D.   On: 10/03/2019 01:50   DG Abd 1 View  Result Date: 10/03/2019 CLINICAL DATA:  Check gastric catheter placement EXAM: ABDOMEN - 1 VIEW COMPARISON:  None. FINDINGS: Scattered large and small bowel gas is noted. Gastric catheter is noted within the stomach. No free air is seen. IMPRESSION: Gastric catheter within the stomach. Electronically Signed   By: Inez Catalina M.D.   On: 10/03/2019 19:51   DG Abd 1 View  Result Date: 10/03/2019 CLINICAL DATA:  69 year old female status post NG placement. EXAM: ABDOMEN - 1 VIEW COMPARISON:  Abdominal radiograph dated 10/03/2019. FINDINGS: Partially visualized enteric tube with tip just distal to the GE junction. Recommend further advancing by additional 8-10 cm. IMPRESSION: Enteric tube with tip just distal to the GE junction. Electronically Signed   By: Anner Crete M.D.   On: 10/03/2019 19:08   DG Abdomen 1 View  Result Date: 10/03/2019 CLINICAL DATA:  Abdominal pain EXAM: ABDOMEN - 1 VIEW COMPARISON:  Oct 03, 2019 FINDINGS: Tip of the NG tube is seen within the upper portion of the film with the tip just entering the GE junction. A mildly dilated air-filled loop of bowel seen within the mid abdomen. Stool seen down to the level of the rectum. IMPRESSION: Tip the NG tube just seen entering the GE junction. Electronically Signed   By: Prudencio Pair M.D.   On:  10/03/2019 01:29   CT ABDOMEN PELVIS W CONTRAST  Result Date: 10/02/2019 CLINICAL DATA:  Abdominal pain and vomiting. Right shoulder surgery 2 days ago. EXAM: CT ABDOMEN AND PELVIS WITH CONTRAST TECHNIQUE: Multidetector CT imaging of the abdomen and pelvis was performed using the standard protocol following bolus administration of intravenous contrast. CONTRAST:  58mL OMNIPAQUE IOHEXOL 300 MG/ML  SOLN COMPARISON:  Radiograph earlier this day. Abdominal CT 02/27/2018, chest abdomen pelvis CTA 07/01/2019 FINDINGS: Lower chest: Lung bases are clear. Hepatobiliary: Post cholecystectomy. There is intra and extrahepatic biliary ductal dilatation. The common bile duct measures approximately 13 mm at the porta hepatis. No obvious choledocholithiasis. No focal hepatic lesion. Pancreas: No ductal dilatation or inflammation. Spleen: Normal in size without focal abnormality. Adrenals/Urinary Tract: Mild left adrenal thickening. No evidence of adrenal nodule. No hydronephrosis or perinephric edema. Homogeneous renal enhancement. There is absent excretion from both kidneys on delayed phase imaging. Urinary bladder is physiologically distended without wall thickening. Stomach/Bowel: Fluid within the distal esophagus. Fluid-filled stomach. Mild Peri pyloric gastric wall thickening. Normal positioning of the ligament of Treitz. Fluid-filled small bowel that is not abnormally dilated. Small amount of mesenteric edema in the right lower quadrant. Air and liquid stool within the cecum. The ascending, transverse, and descending colon are dilated and fluid-filled. Gradual transition to less dilated sigmoid colon with liquid and solid stool. There is solid stool within the distal sigmoid colon. No colonic wall thickening or inflammation. There is no bowel pneumatosis. Vascular/Lymphatic: Aorto bi-iliac atherosclerosis. No aortic aneurysm. The portal vein is patent. No adenopathy. Reproductive: Uterus not seen, presumably surgically  absent. Ovaries are quiescent. Other: Trace free fluid in the pelvis.  No free intra-abdominal air. 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 Facet hypertrophy throughout the lumbar spine. Kyphoplasty within lower thoracic vertebral compression fractures. There are no acute or suspicious osseous abnormalities. IMPRESSION: 1. Findings consistent with generalized ileus and slow  transit. Fluid/liquid stool throughout the majority of the colon with gradual transition to formed stool in the sigmoid. Fluid-filled small bowel and stomach. No transition point or obstruction. There is minimal mesenteric edema but no definite wall thickening or bowel inflammation. 2. Intra and extrahepatic biliary ductal dilatation postcholecystectomy, progressed from February 2021 exam. Recommend correlation with LFTs. If LFTs are normal, no further imaging follow-up is needed. If LFTs are elevated, recommend further evaluation with ERCP or MRCP, MRCP should only be considered if patient is able to tolerate breath hold technique. 3. Absent excretion from both kidneys on delayed phase imaging suggest underlying renal dysfunction. Aortic Atherosclerosis (ICD10-I70.0). Electronically Signed   By: Keith Rake M.D.   On: 10/02/2019 23:38   DG Abd 2 Views  Result Date: 10/04/2019 CLINICAL DATA:  Ileus, follow-up EXAM: ABDOMEN - 2 VIEW COMPARISON:  10/03/2019 FINDINGS: Mild prominence of right lower quadrant bowel loop. Otherwise unremarkable. Enteric tube is within the fundus of the stomach. IMPRESSION: Mild prominence of right lower quadrant bowel loop. Otherwise unremarkable. Electronically Signed   By: Macy Mis M.D.   On: 10/04/2019 08:00   DG Abd Portable 1 View  Result Date: 10/02/2019 CLINICAL DATA:  Hypotension and vomiting EXAM: PORTABLE ABDOMEN - 1 VIEW COMPARISON:  None. FINDINGS: There is a mildly dilated air-filled loop of probable cecum seen within the right lower quadrant.  There is air seen down to the level of the rectum. IMPRESSION: Mildly dilated air-filled loop of bowel in the right lower quadrant, likely cecum which could be due to ileus versus partial bowel obstruction. Electronically Signed   By: Prudencio Pair M.D.   On: 10/02/2019 22:35     Scheduled Meds: . Chlorhexidine Gluconate Cloth  6 each Topical Daily  . citalopram  40 mg Oral Daily  . enoxaparin (LOVENOX) injection  40 mg Subcutaneous Q24H  . lamoTRIgine  200 mg Oral Daily  . latanoprost  1 drop Both Eyes QHS  . pantoprazole  40 mg Oral Daily   Continuous Infusions: . sodium chloride 100 mL/hr at 10/04/19 0205  . acetaminophen 1,000 mg (10/04/19 1539)  . ceFEPime (MAXIPIME) IV 2 g (10/04/19 0932)  . metronidazole 500 mg (10/04/19 1322)  . [START ON 10/05/2019] vancomycin      Active Problems:   Ileus ([place])   Hypotension     Jacquese Hackman Derek Jack, [facility]  If 7PM-7AM, please contact night-coverage [email or web address] Password [email or web address] 10/04/2019, 4:58 PM    LOS: 1 day

## 2019-10-04 NOTE — Consult Note (Signed)
PHARMACY CONSULT NOTE - FOLLOW UP  Pharmacy Consult for Electrolyte Monitoring and Replacement   Recent Labs: Potassium (mmol/L)  Date Value  10/04/2019 2.8 (L)   Magnesium (mg/dL)  Date Value  10/04/2019 2.3   Calcium (mg/dL)  Date Value  10/04/2019 7.4 (L)   Albumin (g/dL)  Date Value  10/02/2019 2.6 (L)   Sodium (mmol/L)  Date Value  10/04/2019 135     Assessment: 69 year old admitted with diffuse ileus, and recent right total shoulder arthroplasty on 5/27.  Nausea and vomiting reported in chart, but seems to be improving.  She appears to be having liquid stool from rectal tube.  Pharmacy has been consulted to replace potassium  Goal of Therapy:  Potassium WNL  Plan:  Potassium dropped from 3.2 to 2.8 despite 4 IV runs of KCl earlier today.  Will give KCl 10 mEq IV q 1 hour x 4, and recheck potassium on 6/1 at 0000 as she could be wasting electrolytes through stool, and also to ensure accuracy of last potassium level.    Sydney Evans ,PharmD 10/04/2019 6:18 PM

## 2019-10-04 NOTE — Plan of Care (Signed)
  Problem: Education: Goal: Knowledge of General Education information will improve Description: Including pain rating scale, medication(s)/side effects and non-pharmacologic comfort measures Outcome: Progressing   Problem: Health Behavior/Discharge Planning: Goal: Ability to manage health-related needs will improve Outcome: Not Progressing Note: Patient ordered a sling for shoulder discomfort management. Also, rectal tube fell out of place earlier. Foley catheter remains. Another PIV inserted earlier. Will continue to monitor overall progression for the remainder of the shift. Wenda Low [facility]

## 2019-10-04 NOTE — Progress Notes (Signed)
Patient had 11 beats of wide QRS complex beats at about 4:03P per CCMD. Will inform physician and continue to monitor. Wenda Low [facility]

## 2019-10-04 NOTE — Progress Notes (Signed)
10/04/2019  Subjective: Patient reports she's feeling better.  Her abdominal pain is much improved.  Denies nausea.  She has a rectal tube and is having liquid stool that she can feel moving.  NG output low recorded for 225 ml over 24 hrs, but when checking on her this morning, it was not connected at all to wall suction.  Vital signs: Temp:  [98.2 F (36.8 C)-98.9 F (37.2 C)] 98.4 F (36.9 C) (05/31 0453) Pulse Rate:  [68-71] 68 (05/31 0746) Resp:  [17-20] 18 (05/31 0746) BP: (122-172)/(60-71) 136/63 (05/31 0746) SpO2:  [96 %-100 %] 100 % (05/31 0746)   Intake/Output: 05/30 0701 - 05/31 0700 In: 2839.1 [I.V.:2139.1; IV Piggyback:700] Out: 3225 [Urine:1875; Emesis/NG output:225; [id number] Last BM Date: 10/03/19  Physical Exam: Constitutional:  No acute distress Abdomen:  Soft, mildly distended, mildly tender in upper abdomen, but otherwise much improved exam compared to yesterday.  After reconnecting NG tube to suction, 200 ml out right away.    Labs:  Recent Labs    10/03/19 1046 10/04/19 0530  WBC 14.1* 11.1*  HGB 11.2* 9.3*  HCT 35.9* 29.8*  PLT 421* 402*   Recent Labs    10/03/19 1046 10/04/19 0530  NA 131* 135  K 4.2 3.2*  CL 105 110  CO2 14* 15*  GLUCOSE 143* 136*  BUN 25* 27*  CREATININE 1.39* 1.06*  CALCIUM 7.7* 7.4*   Recent Labs    10/03/19 0150  LABPROT 15.0  INR 1.2    Imaging: DG Abd 1 View  Result Date: 10/03/2019 CLINICAL DATA:  Check gastric catheter placement EXAM: ABDOMEN - 1 VIEW COMPARISON:  None. FINDINGS: Scattered large and small bowel gas is noted. Gastric catheter is noted within the stomach. No free air is seen. IMPRESSION: Gastric catheter within the stomach. Electronically Signed   By: Inez Catalina M.D.   On: 10/03/2019 19:51   DG Abd 1 View  Result Date: 10/03/2019 CLINICAL DATA:  69 year old female status post NG placement. EXAM: ABDOMEN - 1 VIEW COMPARISON:  Abdominal radiograph dated 10/03/2019. FINDINGS: Partially  visualized enteric tube with tip just distal to the GE junction. Recommend further advancing by additional 8-10 cm. IMPRESSION: Enteric tube with tip just distal to the GE junction. Electronically Signed   By: Anner Crete M.D.   On: 10/03/2019 19:08   DG Abd 2 Views  Result Date: 10/04/2019 CLINICAL DATA:  Ileus, follow-up EXAM: ABDOMEN - 2 VIEW COMPARISON:  10/03/2019 FINDINGS: Mild prominence of right lower quadrant bowel loop. Otherwise unremarkable. Enteric tube is within the fundus of the stomach. IMPRESSION: Mild prominence of right lower quadrant bowel loop. Otherwise unremarkable. Electronically Signed   By: Macy Mis M.D.   On: 10/04/2019 08:00    Assessment/Plan: This is a 69 y.o. female with post-op ileus, improving.  --Patient is having liquid stool through rectal tube, but her NG output, though low, was incorrect as it was not connected to suction.  At this point, I would recommend keeping NG tube in place to suction for today, but since she is improving, I would anticipate likely would remove tomorrow.   Melvyn Neth, [facility]

## 2019-10-05 ENCOUNTER — Encounter: Payer: Self-pay | Admitting: Family Medicine

## 2019-10-05 DIAGNOSIS — R195 Other fecal abnormalities: Secondary | ICD-10-CM

## 2019-10-05 LAB — COMPREHENSIVE METABOLIC PANEL
ALT: 14 U/L (ref 0–44)
AST: 29 U/L (ref 15–41)
Albumin: 2.1 g/dL — ABNORMAL LOW (ref 3.5–5.0)
Alkaline Phosphatase: 52 U/L (ref 38–126)
Anion gap: 10 (ref 5–15)
BUN: 18 mg/dL (ref 8–23)
CO2: 16 mmol/L — ABNORMAL LOW (ref 22–32)
Calcium: 7.3 mg/dL — ABNORMAL LOW (ref 8.9–10.3)
Chloride: 108 mmol/L (ref 98–111)
Creatinine, Ser: 0.72 mg/dL (ref 0.44–1.00)
GFR calc Af Amer: >60 mL/min (ref >60)
GFR calc non Af Amer: >60 mL/min (ref >60)
Glucose, Bld: 88 mg/dL (ref 70–99)
Potassium: 3 mmol/L — ABNORMAL LOW (ref 3.5–5.1)
Sodium: 134 mmol/L — ABNORMAL LOW (ref 135–145)
Total Bilirubin: 0.6 mg/dL (ref 0.3–1.2)
Total Protein: 4.7 g/dL — ABNORMAL LOW (ref 6.5–8.1)

## 2019-10-05 LAB — POTASSIUM
Potassium: 2.8 mmol/L — ABNORMAL LOW (ref 3.5–5.1)
Potassium: 3.1 mmol/L — ABNORMAL LOW (ref 3.5–5.1)
Potassium: 3.3 mmol/L — ABNORMAL LOW (ref 3.5–5.1)

## 2019-10-05 LAB — HEMOGLOBIN AND HEMATOCRIT, BLOOD
HCT: 25.7 % — ABNORMAL LOW (ref 36.0–46.0)
HCT: 25.9 % — ABNORMAL LOW (ref 36.0–46.0)
Hemoglobin: 8.4 g/dL — ABNORMAL LOW (ref 12.0–15.0)
Hemoglobin: 8.2 g/dL — ABNORMAL LOW (ref 12.0–15.0)

## 2019-10-05 LAB — CBC
HCT: 24 % — ABNORMAL LOW (ref 36.0–46.0)
HCT: 24.9 % — ABNORMAL LOW (ref 36.0–46.0)
Hemoglobin: 7.8 g/dL — ABNORMAL LOW (ref 12.0–15.0)
Hemoglobin: 7.9 g/dL — ABNORMAL LOW (ref 12.0–15.0)
MCH: 22.2 pg — ABNORMAL LOW (ref 26.0–34.0)
MCH: 22.3 pg — ABNORMAL LOW (ref 26.0–34.0)
MCHC: 31.7 g/dL (ref 30.0–36.0)
MCHC: 32.5 g/dL (ref 30.0–36.0)
MCV: 68.4 fL — ABNORMAL LOW (ref 80.0–100.0)
MCV: 70.3 fL — ABNORMAL LOW (ref 80.0–100.0)
Platelets: 316 10*3/uL (ref 150–400)
Platelets: 341 10*3/uL (ref 150–400)
RBC: 3.51 MIL/uL — ABNORMAL LOW (ref 3.87–5.11)
RBC: 3.54 MIL/uL — ABNORMAL LOW (ref 3.87–5.11)
RDW: 17.4 % — ABNORMAL HIGH (ref 11.5–15.5)
RDW: 17.7 % — ABNORMAL HIGH (ref 11.5–15.5)
WBC: 9 10*3/uL (ref 4.0–10.5)
WBC: 9.3 10*3/uL (ref 4.0–10.5)
nRBC: 0 % (ref 0.0–0.2)
nRBC: 0 % (ref 0.0–0.2)

## 2019-10-05 LAB — SURGICAL PATHOLOGY

## 2019-10-05 MED ORDER — ONDANSETRON HCL 4 MG/2ML IJ SOLN
INTRAMUSCULAR | Status: AC
  Filled 2019-10-05: qty 2

## 2019-10-05 MED ORDER — PHENYLEPHRINE HCL (PRESSORS) 10 MG/ML IV SOLN
INTRAVENOUS | Status: AC
  Filled 2019-10-05: qty 1

## 2019-10-05 MED ORDER — PANTOPRAZOLE SODIUM 40 MG IV SOLR
40.0000 mg | Freq: Two times a day (BID) | INTRAVENOUS | Status: DC
  Administered 2019-10-05 – 2019-10-09 (×10): 40 mg via INTRAVENOUS
  Filled 2019-10-05 (×10): qty 40

## 2019-10-05 MED ORDER — PROPOFOL 10 MG/ML IV BOLUS
INTRAVENOUS | Status: AC
  Filled 2019-10-05: qty 20

## 2019-10-05 MED ORDER — VANCOMYCIN HCL 750 MG/150ML IV SOLN
750.0000 mg | Freq: Two times a day (BID) | INTRAVENOUS | Status: DC
  Administered 2019-10-05 – 2019-10-07 (×4): 750 mg via INTRAVENOUS
  Filled 2019-10-05 (×6): qty 150

## 2019-10-05 MED ORDER — DEXAMETHASONE SODIUM PHOSPHATE 10 MG/ML IJ SOLN
INTRAMUSCULAR | Status: AC
  Filled 2019-10-05: qty 1

## 2019-10-05 MED ORDER — POTASSIUM CHLORIDE IN NACL 20-0.9 MEQ/L-% IV SOLN
INTRAVENOUS | Status: DC
  Filled 2019-10-05 (×8): qty 1000

## 2019-10-05 MED ORDER — LIDOCAINE HCL (PF) 2 % IJ SOLN
INTRAMUSCULAR | Status: AC
  Filled 2019-10-05: qty 5

## 2019-10-05 MED ORDER — SODIUM CHLORIDE 0.9 % IV SOLN
2.0000 g | Freq: Three times a day (TID) | INTRAVENOUS | Status: DC
  Administered 2019-10-05 – 2019-10-06 (×5): 2 g via INTRAVENOUS
  Filled 2019-10-05 (×9): qty 2

## 2019-10-05 MED ORDER — CEFAZOLIN SODIUM-DEXTROSE 2-4 GM/100ML-% IV SOLN
2.0000 g | INTRAVENOUS | Status: AC
  Administered 2019-10-07: 2 g via INTRAVENOUS
  Filled 2019-10-05: qty 100

## 2019-10-05 MED ORDER — POTASSIUM CHLORIDE 10 MEQ/100ML IV SOLN
10.0000 meq | INTRAVENOUS | Status: AC
  Administered 2019-10-05 (×2): 10 meq via INTRAVENOUS
  Filled 2019-10-05 (×2): qty 100

## 2019-10-05 MED ORDER — ROCURONIUM BROMIDE 10 MG/ML (PF) SYRINGE
PREFILLED_SYRINGE | INTRAVENOUS | Status: AC
  Filled 2019-10-05: qty 10

## 2019-10-05 MED ORDER — POTASSIUM CHLORIDE 10 MEQ/100ML IV SOLN
10.0000 meq | INTRAVENOUS | Status: AC
  Administered 2019-10-05 (×6): 10 meq via INTRAVENOUS
  Filled 2019-10-05 (×6): qty 100

## 2019-10-05 NOTE — Progress Notes (Signed)
10/05/2019  Subjective: Abdominal discomfort improved.  800 cc recorded from the NG tube, however the canister appears to have been recently changed and there is nothing present.  Flushing the NG tube only resulted in a small amount of clear fluid.  She continues to have bowel movements.  She is scheduled to go to the operating room today for open reduction of her dislocated shoulder.  Vital signs: Temp:  [97.8 F (36.6 C)-99 F (37.2 C)] 98.6 F (37 C) (06/01 0836) Pulse Rate:  [64-76] 76 (06/01 1139) Resp:  [16-20] 17 (06/01 1139) BP: (148-194)/(67-83) 194/83 (06/01 1139) SpO2:  [98 %-100 %] 98 % (06/01 1139)   Intake/Output: 05/31 0701 - 06/01 0700 In: 2949.2 [I.V.:1544.3; IV Piggyback:1404.9] Out: 4150 [Urine:2950; Emesis/NG output:800; Stool:400] Last BM Date: 10/04/19  Physical Exam: Constitutional:  No acute distress Abdomen:  Soft, minimally distended, nontender.    Labs:  Recent Labs    10/05/19 0012 10/05/19 0630  WBC 9.3 9.0  HGB 7.9* 7.8*  HCT 24.9* 24.0*  PLT 316 341   Recent Labs    10/04/19 0530 10/04/19 1649 10/05/19 0012 10/05/19 0630  NA 135  --   --  134*  K 3.2*   < > 3.3* 3.0*  CL 110  --   --  108  CO2 15*  --   --  16*  GLUCOSE 136*  --   --  88  BUN 27*  --   --  18  CREATININE 1.06*  --   --  0.72  CALCIUM 7.4*  --   --  7.3*   < > = values in this interval not displayed.   Recent Labs    10/03/19 0150  LABPROT 15.0  INR 1.2    Imaging: No results found.  Assessment/Plan: This is a 69 y.o. female with post-op ileus, improving.  --NG tube was recorded at 800, however nothing is seen in the canister and only a small amount of clear drainage is present in the tube, even with flushing.  She developed ileus after undergoing orthopedic surgery and is scheduled to return to the OR today, I would leave her NG tube in place until we are certain that she will not have recurrence of her symptoms.  Otherwise, she is likely ready for clamping  trial and possible NG tube removal, pending today's operation.

## 2019-10-05 NOTE — Consult Note (Signed)
ORTHOPAEDIC CONSULTATION  REQUESTING PHYSICIAN: Oren Binet*  Chief Complaint:   Right shoulder pain.  History of Present Illness: Sydney Evans is a 69 y.o. female with multiple medical problems who is well-known to me.  She is now 5 days status post a right total shoulder arthroplasty.  The procedure self was uncomplicated.  The patient did well the during her hospitalization and was discharged home on the following day.  Apparently she began to "spit up" on Saturday afternoon.  She called her friend who came over to the house.  However, shortly after arriving, the patient "passed out" and fell to the ground and was "limp as a rag".  The patient was brought to the emergency room where she was noted to be severely dehydrated with a blood pressure of 50.  The patient states that until that point, the shoulder was feeling good.  She believes that the shoulder became dislocated during the repeated transfers and repositioning for studies that the patient had to undergo while in the emergency room during her evaluation and resuscitation.  The patient was admitted to the ICU where she was fluid resuscitated.  She was diagnosed with a postoperative ileus and underwent placement of an NG tube as well as a rectal tube.  She is feeling much better at this time.  She notes significant pain in the right shoulder with any attempted active or passive motion of the shoulder, but denies any numbness or paresthesias down her arm to her hand.  Past Medical History:  Diagnosis Date  . Anemia   . Anxiety   . Arthritis   . Barrett's esophagus   . Bipolar disorder ([place])   . 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 Blood clot in vein   . Cancer ([place])    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  . Carotid arterial disease ([place])   . Contracture of joint of upper arm   . Depression   . Elevated lipids   . GERD (gastroesophageal reflux disease)   . Hypertension   .  Migraine   . Osteoporosis   . Poor balance   . Sinus congestion   . 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 TIA (transient ischemic attack)   . Vertigo    Past Surgical History:  Procedure Laterality Date  . ABDOMINAL HYSTERECTOMY    . BACK SURGERY    . BREAST BIOPSY Left   . CARPAL TUNNEL RELEASE Bilateral   . CATARACT EXTRACTION W/ INTRAOCULAR LENS  IMPLANT, BILATERAL Bilateral   . CHOLECYSTECTOMY    . COLONOSCOPY WITH PROPOFOL N/A 04/09/2018   Procedure: COLONOSCOPY WITH PROPOFOL;  Surgeon: Jonathon Bellows, MD;  Location[facility] ENDOSCOPY;  Service: Gastroenterology;  Laterality: N/A;  . crystal cyst removed on left foot    . ESOPHAGOGASTRODUODENOSCOPY (EGD) WITH PROPOFOL N/A 04/09/2018   Procedure: ESOPHAGOGASTRODUODENOSCOPY (EGD) WITH PROPOFOL;  Surgeon: Jonathon Bellows, MD;  Location: [facility] ENDOSCOPY;  Service: Gastroenterology;  Laterality: N/A;  . EYE SURGERY    . femoral fx    . FOOT SURGERY    . GIVENS CAPSULE STUDY N/A 06/16/2018   Procedure: GIVENS CAPSULE STUDY;  Surgeon: Jonathon Bellows, MD;  Location: [facility] ENDOSCOPY;  Service: Gastroenterology;  Laterality: N/A;  . JOINT REPLACEMENT    . KNEE SURGERY    . TONSILLECTOMY    . TOTAL KNEE ARTHROPLASTY Bilateral   . TOTAL SHOULDER ARTHROPLASTY Left 08/01/2016   Procedure: TOTAL SHOULDER ARTHROPLASTY;  Surgeon: Corky Mull, MD;  Location: [facility] ORS;  Service: Orthopedics;  Laterality: Left;  . TOTAL SHOULDER ARTHROPLASTY Right 09/30/2019   Procedure: TOTAL SHOULDER ARTHROPLASTY;  Surgeon: Corky Mull, MD;  Location: [facility] ORS;  Service: Orthopedics;  Laterality: Right;   Social History   Socioeconomic History  . Marital status: Single    Spouse name: Not on file  . Number of children: 3  . Years of education: Not on file  . Highest education level: Some college, no degree  Occupational History  . Not on file  Tobacco Use  . Smoking status: Current Every Day Smoker    Packs/day: 0.50    Types: Cigarettes    Last attempt to quit: 08/04/2016     Years since quitting: 3.1  . Smokeless tobacco: Never Used  Substance and Sexual Activity  . Alcohol use: No  . Drug use: No  . Sexual activity: Not on file  Other Topics Concern  . Not on file  Social History Narrative   Lives at home alone in an apt   Right handed   Disabled since 1998   Caffeine: about 30 oz daily   Social Determinants of Health   Financial Resource Strain:   . Difficulty of Paying Living Expenses:   Food Insecurity:   . Worried About Charity fundraiser in the Last Year:   . Arboriculturist in the Last Year:   Transportation Needs:   . Film/video editor (Medical):   [name] Lack of Transportation (Non-Medical):   Physical Activity:   . Days of Exercise per Week:   . Minutes of Exercise per Session:   Stress:   . Feeling of Stress :   Social Connections:   . Frequency of Communication with Friends and Family:   . Frequency of Social Gatherings with Friends and Family:   . Attends Religious Services:   . Active Member of Clubs or Organizations:   . Attends Archivist Meetings:   [name] Marital Status:    Family History  Problem Relation Age of Onset  . Other Mother        ?lupus   . Cancer Mother   . High Cholesterol Mother   . CAD Father        CABG  . High Cholesterol Father   . Arthritis Father   . Cancer Sister   . Heart murmur Sister   . Heart murmur Brother   . High blood pressure Other        "for everybody"  . High Cholesterol Other        "for everybody"   Allergies  Allergen Reactions  . Hydroxyzine Hives and Rash  . Gabapentin     Dizzy and confusion  . Hydrocodone Hives and Rash    "terrible scratching"  Make take with benadryl  . Ketorolac Rash     May take with benadryl  . Percocet [Oxycodone-Acetaminophen] Itching and Rash    May take with benadryl    . Toradol [Ketorolac Tromethamine] Rash    May take with benadryl   Prior to Admission medications   Medication Sig Start Date End Date Taking? Authorizing  Provider  aspirin EC 325 MG tablet Take 1 tablet (325 mg total) by mouth daily. 09/30/19   Lattie Corns, PA-C  Carboxymethylcellulose Sodium (LUBRICANT EYE DROPS OP) Place 1 drop into both eyes daily as needed (Dry eye). Systane    [provider]  citalopram (CELEXA) 40 MG tablet Take 40 mg by mouth daily.  [provider]  diclofenac Sodium (VOLTAREN) 1 % GEL Apply 1 application topically daily as needed. 09/13/19   [provider]  diphenhydrAMINE (BENADRYL) 25 MG tablet Take 25 mg by mouth every 6 (six) hours as needed for itching.    [provider]  fluticasone (FLONASE) 50 MCG/ACT nasal spray Place 2 sprays into both nostrils daily as needed for allergies or rhinitis.    [provider]  hydrochlorothiazide (HYDRODIURIL) 25 MG tablet Take 25 mg by mouth daily. 01/16/18   [provider]  IBU 800 MG tablet Take 800 mg by mouth daily as needed for mild pain or moderate pain.  09/25/17   [provider]  lamoTRIgine (LAMICTAL) 200 MG tablet Take 200 mg by mouth daily.    [provider]  lidocaine (LIDODERM) 5 % Place 1 patch onto the skin daily as needed (arthritis).  09/13/19   [provider]  lisinopril (PRINIVIL,ZESTRIL) 40 MG tablet Take 40 mg by mouth daily.    [provider]  omeprazole (PRILOSEC) 40 MG capsule Take 40 mg by mouth daily.    [provider]  oxyCODONE (OXY IR/ROXICODONE) 5 MG immediate release tablet Take 1-2 tablets (5-10 mg total) by mouth every 4 (four) hours as needed for moderate pain (pain score 4-6). 09/30/19   Lattie Corns, PA-C  tiZANidine (ZANAFLEX) 2 MG tablet Take 2 mg by mouth 2 (two) times daily as needed for muscle spasms.  09/10/19   [provider]  traMADol (ULTRAM) 50 MG tablet Take 1 tablet (50 mg total) by mouth every 6 (six) hours. 09/30/19   Lattie Corns, PA-C  Travoprost, BAK Free, (TRAVATAN) 0.004 % SOLN ophthalmic solution  Place 1 drop into both eyes at bedtime. 08/10/19   [provider]   DG Abd 1 View  Result Date: 10/03/2019 CLINICAL DATA:  Check gastric catheter placement EXAM: ABDOMEN - 1 VIEW COMPARISON:  None. FINDINGS: Scattered large and small bowel gas is noted. Gastric catheter is noted within the stomach. No free air is seen. IMPRESSION: Gastric catheter within the stomach. Electronically Signed   By: Inez Catalina M.D.   On: 10/03/2019 19:51   DG Abd 1 View  Result Date: 10/03/2019 CLINICAL DATA:  69 year old female status post NG placement. EXAM: ABDOMEN - 1 VIEW COMPARISON:  Abdominal radiograph dated 10/03/2019. FINDINGS: Partially visualized enteric tube with tip just distal to the GE junction. Recommend further advancing by additional 8-10 cm. IMPRESSION: Enteric tube with tip just distal to the GE junction. Electronically Signed   By: Anner Crete M.D.   On: 10/03/2019 19:08   DG Abd 2 Views  Result Date: 10/04/2019 CLINICAL DATA:  Ileus, follow-up EXAM: ABDOMEN - 2 VIEW COMPARISON:  10/03/2019 FINDINGS: Mild prominence of right lower quadrant bowel loop. Otherwise unremarkable. Enteric tube is within the fundus of the stomach. IMPRESSION: Mild prominence of right lower quadrant bowel loop. Otherwise unremarkable. Electronically Signed   By: Macy Mis M.D.   On: 10/04/2019 08:00    Positive ROS: All other systems have been reviewed and were otherwise negative with the exception of those mentioned in the HPI and as above.  Physical Exam: General:  Alert, no acute distress Psychiatric:  Patient is competent for consent with normal mood and affect   Cardiovascular:  No pedal edema Respiratory:  No wheezing, non-labored breathing GI:  Abdomen is soft and non-tender Skin:  No lesions in the area of chief complaint Neurologic:  Sensation intact distally Lymphatic:  No axillary or cervical lymphadenopathy  Orthopedic Exam:  Orthopedic examination is limited to the right shoulder  upper extremity.  The surgical incision appears to be healing well and is without evidence for infection.  There is some swelling evolving the anterior shoulder region extending into the upper extremity.  There is some deformity in this area as well.  She has moderate tenderness to palpation over the anterior and lateral aspects of the shoulder.  She has more severe pain with any attempted active or passive motion of the shoulder.  She is able to actively flex and extend her wrist and all digits of her hand.  She also is able to gently flex her elbow.  She has intact sensation to the musculocutaneous and axillary nerve distributions.  She has good capillary refill to her right hand.  X-rays:  Recent x-rays of the right shoulder are available for review and have been reviewed by myself.  These films confirmed the presence of an anterior dislocation of the humeral prosthesis.  No evidence for fractures or loosening of the femoral or glenoid components are noted.  Assessment: Acute postoperative dislocation of right total shoulder arthroplasty.  Plan: The treatment options have been discussed with the patient.  Given that she is only 5 days status post a total shoulder arthroplasty, I am concerned that she may have disrupted her subscapularis tendon as a result of the dislocation.  Therefore, I feel that the definitive treatment for this injury would be to do an open reduction with repair of the subscapularis tendon if necessary.  This procedure has been discussed in detail with the patient, as have the potential risks (including bleeding, infection, nerve and blood vessel injury, persistent recurrent pain, stiffness of the shoulder, recurrent instability, loosening of and/or failure of the components, need for further surgery, blood clots, strokes, heart attacks and/or arrhythmias, etc.) and benefits.  The patient states her understanding wishes to proceed.  A formal written consent will be obtained by the  nursing staff.  Thank you for asking me to participate in the care of this most pleasant woman.  I am looking forward to doing this case this afternoon, assuming she is cleared medically.   Pascal Lux, MD  Beeper #:  [phone]  10/05/2019 7:55 AM  Addendum:  The patient's potassium has decreased from 3.3 at midnight down to 2.8 early this afternoon.  In addition, the patient did have a short run of V. tach yesterday.  Therefore, the anesthesiologist would like the patient optimized before taking her to surgery.  Specifically, the potassium needs to be above 3.0 and she has to have the official cardiac clearance from Dr. Saunders Revel.  I have explained this to the patient and have rescheduled her to 11 AM on Wednesday, 6/2.  Pascal Lux, MD  Beeper #:  [phone]  10/05/2019 5:08 PM

## 2019-10-05 NOTE — Consult Note (Addendum)
Sydney Antigua, MD [address] [place], [place], [place], [address] [address], [place], [place], [place], [address] Phone: [phone]  Fax: [phone]  Consultation  Referring Provider:     Dr. Jamse Arn Primary Care Physician:  Patient, No Pcp Per Reason for Consultation: Guaiac positive stool  Date of Admission:  10/02/2019 Date of Consultation:  10/05/2019         HPI:   Sydney Evans is a 69 y.o. female with recent shoulder arthroplasty on May 27 and patient requiring oxycodone since then admitted with generalized ileus, and GI consulted for guaiac positive stool.  Patient denies any abdominal pain.  Is on NG to suction for ileus and states her nausea vomiting, abdominal distention has significantly improved since then.  No further nausea or vomiting.  No abdominal pain.  No abdominal distention.  Reporting passing flatus and is having some loose stools as well.  No frank bleeding.  Stool description under flowsheets are noted to be brown and green in color  Surgery report from May 27 reports 200 cc of blood loss  Patient has had recent EGDs and colonoscopies by Dr. Vicente Males in December 2019 and has history of iron deficiency anemia.  Last EGD in December 2019 with 3 small angiectasia without bleeding noted and treated with APC in the duodenum.  Colonoscopy was normal except diverticulosis.  Past Medical History:  Diagnosis Date   Anemia    Anxiety    Arthritis    Barrett's esophagus    Bipolar disorder ([facility])    Blocked artery    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   Blood clot in vein    Cancer ([facility])    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   Carotid arterial disease ([facility])    Contracture of joint of upper arm    Depression    Elevated lipids    GERD (gastroesophageal reflux disease)    Hypertension    Migraine    Osteoporosis    Poor balance    Sinus congestion    Stroke ([facility])    x 2   TIA (transient ischemic attack)    Vertigo     Past Surgical History:  Procedure  Laterality Date   ABDOMINAL HYSTERECTOMY     BACK SURGERY     BREAST BIOPSY Left    CARPAL TUNNEL RELEASE Bilateral    CATARACT EXTRACTION W/ INTRAOCULAR LENS  IMPLANT, BILATERAL Bilateral    CHOLECYSTECTOMY     COLONOSCOPY WITH PROPOFOL N/A 04/09/2018   Procedure: COLONOSCOPY WITH PROPOFOL;  Surgeon: Jonathon Bellows, MD;  Location: [facility] ENDOSCOPY;  Service: Gastroenterology;  Laterality: N/A;   crystal cyst removed on left foot     ESOPHAGOGASTRODUODENOSCOPY (EGD) WITH PROPOFOL N/A 04/09/2018   Procedure: ESOPHAGOGASTRODUODENOSCOPY (EGD) WITH PROPOFOL;  Surgeon: Jonathon Bellows, MD;  Location: [facility] ENDOSCOPY;  Service: Gastroenterology;  Laterality: N/A;   EYE SURGERY     femoral fx     FOOT SURGERY     GIVENS CAPSULE STUDY N/A 06/16/2018   Procedure: GIVENS CAPSULE STUDY;  Surgeon: Jonathon Bellows, MD;  Location: [facility] ENDOSCOPY;  Service: Gastroenterology;  Laterality: N/A;   JOINT REPLACEMENT     KNEE SURGERY     TONSILLECTOMY     TOTAL KNEE ARTHROPLASTY Bilateral    TOTAL SHOULDER ARTHROPLASTY Left 08/01/2016   Procedure: TOTAL SHOULDER ARTHROPLASTY;  Surgeon: Corky Mull, MD;  Location: [facility] ORS;  Service: Orthopedics;  Laterality: Left;   TOTAL SHOULDER ARTHROPLASTY Right 09/30/2019   Procedure: TOTAL SHOULDER ARTHROPLASTY;  Surgeon: Roland Rack,  Marshall Cork, MD;  Location: [facility] ORS;  Service: Orthopedics;  Laterality: Right;    Prior to Admission medications   Medication Sig Start Date End Date Taking? Authorizing Provider  aspirin EC 325 MG tablet Take 1 tablet (325 mg total) by mouth daily. 09/30/19   Lattie Corns, PA-C  Carboxymethylcellulose Sodium (LUBRICANT EYE DROPS OP) Place 1 drop into both eyes daily as needed (Dry eye). Systane    [provider]  citalopram (CELEXA) 40 MG tablet Take 40 mg by mouth daily.    [provider]  diclofenac Sodium (VOLTAREN) 1 % GEL Apply 1 application topically daily as needed. 09/13/19   [provider]   diphenhydrAMINE (BENADRYL) 25 MG tablet Take 25 mg by mouth every 6 (six) hours as needed for itching.    [provider]  fluticasone (FLONASE) 50 MCG/ACT nasal spray Place 2 sprays into both nostrils daily as needed for allergies or rhinitis.    [provider]  hydrochlorothiazide (HYDRODIURIL) 25 MG tablet Take 25 mg by mouth daily. 01/16/18   [provider]  IBU 800 MG tablet Take 800 mg by mouth daily as needed for mild pain or moderate pain.  09/25/17   [provider]  lamoTRIgine (LAMICTAL) 200 MG tablet Take 200 mg by mouth daily.    [provider]  lidocaine (LIDODERM) 5 % Place 1 patch onto the skin daily as needed (arthritis).  09/13/19   [provider]  lisinopril (PRINIVIL,ZESTRIL) 40 MG tablet Take 40 mg by mouth daily.    [provider]  omeprazole (PRILOSEC) 40 MG capsule Take 40 mg by mouth daily.    [provider]  oxyCODONE (OXY IR/ROXICODONE) 5 MG immediate release tablet Take 1-2 tablets (5-10 mg total) by mouth every 4 (four) hours as needed for moderate pain (pain score 4-6). 09/30/19   Lattie Corns, PA-C  tiZANidine (ZANAFLEX) 2 MG tablet Take 2 mg by mouth 2 (two) times daily as needed for muscle spasms.  09/10/19   [provider]  traMADol (ULTRAM) 50 MG tablet Take 1 tablet (50 mg total) by mouth every 6 (six) hours. 09/30/19   Lattie Corns, PA-C  Travoprost, BAK Free, (TRAVATAN) 0.004 % SOLN ophthalmic solution Place 1 drop into both eyes at bedtime. 08/10/19   [provider]    Family History  Problem Relation Age of Onset   Other Mother        ?lupus    Cancer Mother    High Cholesterol Mother    CAD Father        CABG   High Cholesterol Father    Arthritis Father    Cancer Sister    Heart murmur Sister    Heart murmur Brother    High blood pressure Other        "for everybody"   High Cholesterol Other        "for everybody"     Social  History   Tobacco Use   Smoking status: Current Every Day Smoker    Packs/day: 0.50    Types: Cigarettes    Last attempt to quit: 08/04/2016    Years since quitting: 3.1   Smokeless tobacco: Never Used  Substance Use Topics   Alcohol use: No   Drug use: No    Allergies as of 10/02/2019 - Review Complete 10/02/2019  Allergen Reaction Noted   Hydroxyzine Hives and Rash 01/20/2018   Gabapentin  08/31/2015   Hydrocodone Hives and  Rash 07/22/2016   Ketorolac Rash    Percocet [oxycodone-acetaminophen] Itching and Rash 12/22/2014   Toradol [ketorolac tromethamine] Rash 08/01/2016    Review of Systems:    All systems reviewed and negative except where noted in HPI.   Physical Exam:  Vital signs in last 24 hours: Vitals:   10/04/19 2013 10/05/19 0432 10/05/19 0836 10/05/19 1139  BP: (!) 178/71 (!) 171/81 (!) 148/73 (!) 194/83  Pulse: 75 66 64 76  Resp: 20 20 16 17   Temp: 97.8 F (36.6 C) 99 F (37.2 C) 98.6 F (37 C)   TempSrc: Oral Oral Oral   SpO2: 99% 99% 100% 98%  Weight:      Height:       Last BM Date: 10/04/19 General:   Pleasant, cooperative in NAD Head:  Normocephalic and atraumatic. Eyes:   No icterus.   Conjunctiva pink. PERRLA. Ears:  Normal auditory acuity. Neck:  Supple; no masses or thyroidomegaly Lungs: Respirations even and unlabored. Lungs clear to auscultation bilaterally.   No wheezes, crackles, or rhonchi.  Abdomen:  Soft, nondistended, nontender. Normal bowel sounds. No appreciable masses or hepatomegaly.  No rebound or guarding.  Neurologic:  Alert and oriented x3;  grossly normal neurologically. Skin:  Intact without significant lesions or rashes. Cervical Nodes:  No significant cervical adenopathy. Psych:  Alert and cooperative. Normal affect.  LAB RESULTS: Recent Labs    10/04/19 0530 10/05/19 0012 10/05/19 0630  WBC 11.1* 9.3 9.0  HGB 9.3* 7.9* 7.8*  HCT 29.8* 24.9* 24.0*  PLT 402* 316 341   BMET Recent Labs     10/03/19 1046 10/03/19 1046 10/04/19 0530 10/04/19 0530 10/04/19 1649 10/05/19 0012 10/05/19 0630  NA 131*  --  135  --   --   --  134*  K 4.2   < > 3.2*   < > 2.8* 3.3* 3.0*  CL 105  --  110  --   --   --  108  CO2 14*  --  15*  --   --   --  16*  GLUCOSE 143*  --  136*  --   --   --  88  BUN 25*  --  27*  --   --   --  18  CREATININE 1.39*  --  1.06*  --   --   --  0.72  CALCIUM 7.7*  --  7.4*  --   --   --  7.3*   < > = values in this interval not displayed.   LFT Recent Labs    10/05/19 0630  PROT 4.7*  ALBUMIN 2.1*  AST 29  ALT 14  ALKPHOS 52  BILITOT 0.6   PT/INR Recent Labs    10/03/19 0150  LABPROT 15.0  INR 1.2    STUDIES: DG Abd 1 View  Result Date: 10/03/2019 CLINICAL DATA:  Check gastric catheter placement EXAM: ABDOMEN - 1 VIEW COMPARISON:  None. FINDINGS: Scattered large and small bowel gas is noted. Gastric catheter is noted within the stomach. No free air is seen. IMPRESSION: Gastric catheter within the stomach. Electronically Signed   By: Inez Catalina M.D.   On: 10/03/2019 19:51   DG Abd 1 View  Result Date: 10/03/2019 CLINICAL DATA:  69 year old female status post NG placement. EXAM: ABDOMEN - 1 VIEW COMPARISON:  Abdominal radiograph dated 10/03/2019. FINDINGS: Partially visualized enteric tube with tip just distal to the GE junction. Recommend further advancing by additional 8-10 cm. IMPRESSION: Enteric tube  with tip just distal to the GE junction. Electronically Signed   By: Anner Crete M.D.   On: 10/03/2019 19:08   DG Abd 2 Views  Result Date: 10/04/2019 CLINICAL DATA:  Ileus, follow-up EXAM: ABDOMEN - 2 VIEW COMPARISON:  10/03/2019 FINDINGS: Mild prominence of right lower quadrant bowel loop. Otherwise unremarkable. Enteric tube is within the fundus of the stomach. IMPRESSION: Mild prominence of right lower quadrant bowel loop. Otherwise unremarkable. Electronically Signed   By: Macy Mis M.D.   On: 10/04/2019 08:00      Impression /  Plan:   Sydney Evans is a 69 y.o. y/o female with recent shoulder arthroplasty, requiring opioids postop, with generalized ileus and GI being consulted for guaiac positive stool  Patient has had a recent colonoscopy within the last 2 years and therefore, risk of malignancy is low  Guaiac positive stool in the absence of gross bleeding does not suggest a hemodynamically significant bleed  Her anemia is likely due to blood loss during her surgery (hemoglobin noted to drop 1 day post surgery)  However, would recommend discontinuation of rectal tube at this time as this can cause rectal ulceration/irritation  Check iron levels and replace IV iron if anemia work-up is consistent with iron deficiency, given her history for the same  If gross bleeding occurs, endoscopic procedures can be considered at that time  In the presence of acute issues, planned orthopedic procedures during this admission, generalized ileus, and no gross bleeding, risks of endoscopic procedures would outweigh benefits at this time  However, if anemia does not improve colonoscopy can be considered for guaiac positive stool as an inpatient or outpatient  Minimize opioids as much as possible Encourage ambulation as tolerated Encourage position changes in bed to promote bowel motility  Monitor and replace electrolytes closely  Liver enzymes are normal  Patient should follow-up with Dr. Vicente Males in clinic closely as he was planning on repeat upper endoscopy to assess an area seen on small bowel capsule to see if it is a polyp or ampulla.  For some reason this procedure was canceled.  This is an elective procedure and can be scheduled at the time of her clinic appointment with him.  Please make clinic appointment at the time of discharge.  Please page GI with any signs of active GI bleeding  Thank you for involving me in the care of this patient.      LOS: 2 days   Virgel Manifold, MD  10/05/2019, 12:34 PM

## 2019-10-05 NOTE — Progress Notes (Signed)
PROGRESS NOTE    Sydney Evans  [id number]  DOB: 08-25-1950  DOA: 10/02/2019 PCP: Patient, No Pcp Per Outpatient Specialists:   Hospital course:  Sydney Evans is a 69 y.o. female admitted overnight to the hospitalist team with diffuse ileus.  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 Roland Rack.  Patient reports that since she got home, she started having abdominal distention, which progressed into nausea and vomiting.  She also reports abdominal pain but denies any fevers, chills, chest pain, shortness of breath.  In the emergency room, she was noted to be in AKI with a creatinine of 1.37 from prior of 0.63, severe hypokalemia with potassium of 2.2, lactic acidosis of 4.4, and WBC of 14.  She had a CT scan of the abdomen pelvis showing generalized ileus with no transition point or evidence of bowel obstruction. The patient was empirically given IV cefepime and vancomycin.  Subjective:  Patient is the same as she did yesterday.  She is hoping to have her shoulder surgery today.  Discussed significant drop in her H&H over the past 2 days from hemoglobin of 11.2 to 7.8 today.  She hopes this will not prevent her from having surgery today.  Denies any further abdominal pain.  Notes she is continues to have lots of stool.   Objective: Vitals:   10/05/19 0432 10/05/19 0836 10/05/19 1139 10/05/19 1406  BP: (!) 171/81 (!) 148/73 (!) 194/83 (!) 169/73  Pulse: 66 64 76 68  Resp: 20 16 17    Temp: 99 F (37.2 C) 98.6 F (37 C)    TempSrc: Oral Oral    SpO2: 99% 100% 98%   Weight:      Height:        Intake/Output Summary (Last 24 hours) at 10/05/2019 1503 Last data filed at 10/05/2019 1141 Gross per 24 hour  Intake 3449.21 ml  Output 4975 ml  Net -1525.79 ml   Filed Weights   10/03/19 0524  Weight: 76.2 kg     Exam:  General: Patient lying in bed looking tired but awake alert and coherent.  NG tube does not appear to have anything in the  suction.   Eyes: sclera anicteric, conjuctiva mild injection bilaterally CVS: S1-S2, regular  Respiratory:  decreased air entry bilaterally secondary to decreased inspiratory effort, rales at bases  GI: Patient has bowel sounds which are normoactive, abdomen is soft and nontender to light or deep palpation.  NG tube in place as noted above. LE: No edema.    Assessment & Plan:   69 year old female developed ileus after shoulder surgery.  Her shoulder is also again dislocated and needs open reduction again.  Surgery has been delayed by treatment of ileus, drop in H&H, hypokalemia and episode of NSVT.  Right shoulder dislocation This is patient's biggest concern and complaint given significant pain. Will need open reduction per orthopedics when patient is stable to go to OR. Patient was supposed to go today however potassium on repeat was 2.8, this will need to be normalized. H&H initially dropped earlier today however is stable on repeat. Discussed with cardiology, episode of 11 beat NSVT should not preclude surgery especially when potassium is normalized. Hopefully patient can go for surgery tomorrow once potassium has been adequately repleted, pharmacy consultation is in place and I have also requested 6 runs of K with repeat potassium check tonight.  Drop in H&H/reddish colored stool Very much appreciate GI consultation recommendations for conservative management. Will discontinue rectal tube  Continue IV Protonix Continue SCD, no Lovenox Patient remains hemodynamically stable.  Generalized ileus Improving Appreciate general surgery consultation and suggestion for conservative management Patient with decreasing NG tube output. However we will keep NG tube in place status until shoulder surgery per general surgery recommendations as initial ileus was precipitated by shoulder surgery.   Continue coverage with cefepime and Flagyl for now. Continue n.p.o., IV hydration, symptomatic  management of nausea  VT Patient had 11 beat run of VT, while hypokalemic.   Continue telemetry monitoring and continue to aggressively replete potassium. Magnesium is 2.3 and within normal limits. Discussed with cardiology who recommended normalization of potassium.  Mixed metabolic Acidosis Bicarb improving  Hypokalemia Potassium is being aggressively repleted, will recheck tonight and continue repletion  Secondary to nausea and vomiting.   Likely contributing to ileus.  Bradycardia Thought to be secondary to vasovagal reaction, this is improved with hydration.  Hypertension. We will hold off HCTZ and Zestril given her initial hypotension.  Bipolar disorder. We will continue Lamictal.  GERD. We will continue PPI therapy.   DVT prophylaxis: SCD Code Status: DNR Family Communication: Patient's niece [name] was at bedside throughout Disposition Plan:   Patient is from: Home  Anticipated Discharge Location: TBD  Barriers to Discharge: Acutely ill  Is patient medically stable for Discharge: No   Consultants:  Orthopedics  General surgery  Procedures:  None yet  Antimicrobials:  Vancomycin  Cefepime  Flagyl   Data Reviewed:  Basic Metabolic Panel: Recent Labs  Lab 10/02/19 2145 10/02/19 2145 10/03/19 0150 10/03/19 0150 10/03/19 1046 10/03/19 1046 10/04/19 0530 10/04/19 1649 10/05/19 0012 10/05/19 0630 10/05/19 1410  NA 131*  --  129*  --  131*  --  135  --   --  134*  --   K 2.2*   < > 3.5   < > 4.2   < > 3.2* 2.8* 3.3* 3.0* 2.8*  CL 106  --  100  --  105  --  110  --   --  108  --   CO2 14*  --  17*  --  14*  --  15*  --   --  16*  --   GLUCOSE 167*  --  167*  --  143*  --  136*  --   --  88  --   BUN 15  --  21  --  25*  --  27*  --   --  18  --   CREATININE 1.37*  --  1.66*  --  1.39*  --  1.06*  --   --  0.72  --   CALCIUM 7.1*  --  7.6*  --  7.7*  --  7.4*  --   --  7.3*  --   MG 1.7  --   --   --  2.4  --  2.3  --   --   --   --     < > = values in this interval not displayed.   Liver Function Tests: Recent Labs  Lab 10/02/19 2145 10/05/19 0630  AST 78* 29  ALT 22 14  ALKPHOS 66 52  BILITOT 0.7 0.6  PROT 5.4* 4.7*  ALBUMIN 2.6* 2.1*   Recent Labs  Lab 10/02/19 2145  LIPASE 145*   No results for input(s): AMMONIA in the last 168 hours. CBC: Recent Labs  Lab 10/02/19 2145 10/02/19 2145 10/03/19 0150 10/03/19 0150 10/03/19 1046 10/04/19 0530 10/05/19 0012 10/05/19 0630  10/05/19 1345  WBC 14.0*   < > 14.4*  --  14.1* 11.1* 9.3 9.0  --   NEUTROABS 12.6*  --   --   --   --   --   --   --   --   HGB 8.9*   < > 10.0*   < > 11.2* 9.3* 7.9* 7.8* 8.4*  HCT 29.5*   < > 33.7*   < > 35.9* 29.8* 24.9* 24.0* 25.7*  MCV 74.9*   < > 75.4*  --  71.5* 71.3* 70.3* 68.4*  --   PLT 402*   < > 447*  --  421* 402* 316 341  --    < > = values in this interval not displayed.   Cardiac Enzymes: No results for input(s): CKTOTAL, CKMB, CKMBINDEX, TROPONINI in the last 168 hours. BNP (last 3 results) No results for input(s): PROBNP in the last 8760 hours. CBG: Recent Labs  Lab 10/04/19 2035  GLUCAP 89    Recent Results (from the past 240 hour(s))  SARS CORONAVIRUS 2 (TAT 6-24 HRS) Nasopharyngeal Nasopharyngeal Swab     Status: None   Collection Time: 09/28/19 10:37 AM   Specimen: Nasopharyngeal Swab  Result Value Ref Range Status   SARS Coronavirus 2 NEGATIVE NEGATIVE Final    Comment: (NOTE) SARS-CoV-2 target nucleic acids are NOT DETECTED. The SARS-CoV-2 RNA is generally detectable in upper and lower respiratory specimens during the acute phase of infection. Negative results do not preclude SARS-CoV-2 infection, do not rule out co-infections with other pathogens, and should not be used as the sole basis for treatment or other patient management decisions. Negative results must be combined with clinical observations, patient history, and epidemiological information. The expected result is Negative. Fact  Sheet for Patients: SugarRoll.be Fact Sheet for Healthcare Providers: https://www.woods-mathews.com/ This test is not yet approved or cleared by the [place] FDA and  has been authorized for detection and/or diagnosis of SARS-CoV-2 by FDA under an Emergency Use Authorization (EUA). This EUA will remain  in effect (meaning this test can be used) for the duration of the COVID-19 declaration under Section 56 4(b)(1) of the Act, 21 U.S.C. section 360bbb-3(b)(1), unless the authorization is terminated or revoked sooner. Performed at [facility], [address] [place], [place] [address]   Blood culture (routine x 2)     Status: None (Preliminary result)   Collection Time: 10/02/19  9:45 PM   Specimen: BLOOD  Result Value Ref Range Status   Specimen Description BLOOD LEFT ANTECUBITAL  Final   Special Requests   Final    BOTTLES DRAWN AEROBIC AND ANAEROBIC Blood Culture results may not be optimal due to an inadequate volume of blood received in culture bottles   Culture   Final    NO GROWTH 3 DAYS Performed at [facility], [address] [place], [place] [address]    Report Status PENDING  Incomplete  Blood culture (routine x 2)     Status: None (Preliminary result)   Collection Time: 10/02/19  9:48 PM   Specimen: BLOOD  Result Value Ref Range Status   Specimen Description BLOOD BLOOD LEFT ARM  Final   Special Requests   Final    BOTTLES DRAWN AEROBIC AND ANAEROBIC Blood Culture adequate volume   Culture   Final    NO GROWTH 3 DAYS Performed at [facility], [address] [place], [place] [address]    Report Status PENDING  Incomplete  SARS  Coronavirus 2 by RT PCR (hospital order, performed in [facility] hospital lab) Nasopharyngeal Nasopharyngeal Swab     Status: None   Collection Time: 10/02/19 11:48 PM   Specimen: Nasopharyngeal Swab  Result Value Ref Range Status   SARS Coronavirus 2 NEGATIVE NEGATIVE  Final    Comment: (NOTE) SARS-CoV-2 target nucleic acids are NOT DETECTED. The SARS-CoV-2 RNA is generally detectable in upper and lower respiratory specimens during the acute phase of infection. The lowest concentration of SARS-CoV-2 viral copies this assay can detect is 250 copies / mL. A negative result does not preclude SARS-CoV-2 infection and should not be used as the sole basis for treatment or other patient management decisions.  A negative result may occur with improper specimen collection / handling, submission of specimen other than nasopharyngeal swab, presence of viral mutation(s) within the areas targeted by this assay, and inadequate number of viral copies (<250 copies / mL). A negative result must be combined with clinical observations, patient history, and epidemiological information. Fact Sheet for Patients:   StrictlyIdeas.no Fact Sheet for Healthcare Providers: BankingDealers.co.za This test is not yet approved or cleared  by the [place] FDA and has been authorized for detection and/or diagnosis of SARS-CoV-2 by FDA under an Emergency Use Authorization (EUA).  This EUA will remain in effect (meaning this test can be used) for the duration of the COVID-19 declaration under Section 564(b)(1) of the Act, 21 U.S.C. section 360bbb-3(b)(1), unless the authorization is terminated or revoked sooner. Performed at [facility], [place], [place] [address]   C Difficile Quick Screen w PCR reflex     Status: None   Collection Time: 10/03/19  4:08 AM   Specimen: STOOL  Result Value Ref Range Status   C Diff antigen NEGATIVE NEGATIVE Final   C Diff toxin NEGATIVE NEGATIVE Final   C Diff interpretation No C. difficile detected.  Final    Comment: Performed at [facility], [address] [place],  [address]  Gastrointestinal Panel by PCR , Stool     Status: None   Collection Time:  10/03/19  4:09 AM   Specimen: STOOL  Result Value Ref Range Status   Campylobacter species NOT DETECTED NOT DETECTED Final   Plesimonas shigelloides NOT DETECTED NOT DETECTED Final   Salmonella species NOT DETECTED NOT DETECTED Final   Yersinia enterocolitica NOT DETECTED NOT DETECTED Final   Vibrio species NOT DETECTED NOT DETECTED Final   Vibrio cholerae NOT DETECTED NOT DETECTED Final   Enteroaggregative E coli (EAEC) NOT DETECTED NOT DETECTED Final   Enteropathogenic E coli (EPEC) NOT DETECTED NOT DETECTED Final   Enterotoxigenic E coli (ETEC) NOT DETECTED NOT DETECTED Final   Shiga like toxin producing E coli (STEC) NOT DETECTED NOT DETECTED Final   Shigella/Enteroinvasive E coli (EIEC) NOT DETECTED NOT DETECTED Final   Cryptosporidium NOT DETECTED NOT DETECTED Final   Cyclospora cayetanensis NOT DETECTED NOT DETECTED Final   Entamoeba histolytica NOT DETECTED NOT DETECTED Final   Giardia lamblia NOT DETECTED NOT DETECTED Final   Adenovirus F40/41 NOT DETECTED NOT DETECTED Final   Astrovirus NOT DETECTED NOT DETECTED Final   Norovirus GI/GII NOT DETECTED NOT DETECTED Final   Rotavirus A NOT DETECTED NOT DETECTED Final   Sapovirus (I, II, IV, and V) NOT DETECTED NOT DETECTED Final    Comment: Performed at [facility], [address] [place], [place] [address]  C Difficile Quick Screen w PCR reflex     Status:  None   Collection Time: 10/03/19  4:14 AM   Specimen: Stool  Result Value Ref Range Status   C Diff antigen NEGATIVE NEGATIVE Final   C Diff toxin NEGATIVE NEGATIVE Final   C Diff interpretation No C. difficile detected.  Final    Comment: Performed at [facility], [address] [place], [place] [address]      Studies: DG Abd 1 View  Result Date: 10/03/2019 CLINICAL DATA:  Check gastric catheter placement EXAM: ABDOMEN - 1 VIEW COMPARISON:  None. FINDINGS: Scattered large and small bowel gas is noted. Gastric catheter is noted within the  stomach. No free air is seen. IMPRESSION: Gastric catheter within the stomach. Electronically Signed   By: Inez Catalina M.D.   On: 10/03/2019 19:51   DG Abd 1 View  Result Date: 10/03/2019 CLINICAL DATA:  69 year old female status post NG placement. EXAM: ABDOMEN - 1 VIEW COMPARISON:  Abdominal radiograph dated 10/03/2019. FINDINGS: Partially visualized enteric tube with tip just distal to the GE junction. Recommend further advancing by additional 8-10 cm. IMPRESSION: Enteric tube with tip just distal to the GE junction. Electronically Signed   By: Anner Crete M.D.   On: 10/03/2019 19:08   DG Abd 2 Views  Result Date: 10/04/2019 CLINICAL DATA:  Ileus, follow-up EXAM: ABDOMEN - 2 VIEW COMPARISON:  10/03/2019 FINDINGS: Mild prominence of right lower quadrant bowel loop. Otherwise unremarkable. Enteric tube is within the fundus of the stomach. IMPRESSION: Mild prominence of right lower quadrant bowel loop. Otherwise unremarkable. Electronically Signed   By: Macy Mis M.D.   On: 10/04/2019 08:00     Scheduled Meds: . Chlorhexidine Gluconate Cloth  6 each Topical Daily  . citalopram  40 mg Oral Daily  . lamoTRIgine  200 mg Oral Daily  . latanoprost  1 drop Both Eyes QHS  . pantoprazole (PROTONIX) IV  40 mg Intravenous Q12H   Continuous Infusions: . 0.9 % NaCl with KCl 20 mEq / L 125 mL/hr at 10/05/19 0344  .  ceFAZolin (ANCEF) IV    . ceFEPime (MAXIPIME) IV 2 g (10/05/19 0941)  . metronidazole 500 mg (10/05/19 1402)  . potassium chloride    . vancomycin 750 mg (10/05/19 1130)    Active Problems:   Ileus ([place])   Hypotension     Aleksandra Raben Derek Jack, [facility]  If 7PM-7AM, please contact night-coverage [email or web address] Password [email or web address] 10/05/2019, 3:03 PM    LOS: 2 days

## 2019-10-05 NOTE — Anesthesia Preprocedure Evaluation (Addendum)
Anesthesia Evaluation  Patient identified by MRN, date of birth, ID band Patient awake    Reviewed: Allergy & Precautions, H&P , NPO status , Patient's Chart, lab work & pertinent test results, reviewed documented beta blocker date and time   History of Anesthesia Complications Negative for: history of anesthetic complications  Airway Mallampati: III  TM Distance: <3 FB Neck ROM: limited    Dental  (+) Upper Dentures, Lower Dentures, Dental Advidsory Given   Pulmonary neg sleep apnea, neg COPD, Current Smoker and Patient abstained from smoking.,    Pulmonary exam normal        Cardiovascular hypertension, Pt. on medications Normal cardiovascular exam  2016 ECHO Study Conclusions   - Left ventricle: The cavity size was normal. Wall thickness was  normal. Systolic function was normal. The estimated ejection  fraction was in the range of 55% to 60%. Wall motion was normal;  there were no regional wall motion abnormalities. Doppler  parameters are consistent with abnormal left ventricular  relaxation (grade 1 diastolic dysfunction).  - Left atrium: The atrium was mildly dilated.    Neuro/Psych  Headaches, neg Seizures PSYCHIATRIC DISORDERS Anxiety Depression Bipolar Disorder TIACVA, No Residual Symptoms    GI/Hepatic Neg liver ROS, GERD  Medicated and Controlled,  Endo/Other  neg diabetes  Renal/GU negative Renal ROS     Musculoskeletal  (+) Arthritis ,   Abdominal   Peds  Hematology  (+) Blood dyscrasia, anemia ,   Anesthesia Other Findings    Reproductive/Obstetrics                            Anesthesia Physical  Anesthesia Plan  ASA: III  Anesthesia Plan: General   Post-op Pain Management:  Regional for Post-op pain   Induction: Intravenous  PONV Risk Score and Plan: 2 and Ondansetron, Dexamethasone and Treatment may vary due to age or medical condition  Airway  Management Planned: Oral ETT  Additional Equipment:   Intra-op Plan:   Post-operative Plan: Extubation in OR  Informed Consent: I have reviewed the patients History and Physical, chart, labs and discussed the procedure including the risks, benefits and alternatives for the proposed anesthesia with the patient or authorized representative who has indicated his/her understanding and acceptance.   Patient has DNR.  Discussed DNR with patient and Suspend DNR.     Plan Discussed with:   Anesthesia Plan Comments: (Addendum Bertell Maria, MD): spoke to patient prior to block placement regarding her DNR. Patient wishes to suspend the DNR perioperatively with resumption after the periop period.)      Anesthesia Quick Evaluation

## 2019-10-05 NOTE — Consult Note (Signed)
PHARMACY CONSULT NOTE - FOLLOW UP  Pharmacy Consult for Electrolyte Monitoring and Replacement   Recent Labs: Potassium (mmol/L)  Date Value  10/05/2019 3.3 (L)   Magnesium (mg/dL)  Date Value  10/04/2019 2.3   Calcium (mg/dL)  Date Value  10/04/2019 7.4 (L)   Albumin (g/dL)  Date Value  10/02/2019 2.6 (L)   Sodium (mmol/L)  Date Value  10/04/2019 135     Assessment: 69 year old admitted with diffuse ileus, and recent right total shoulder arthroplasty on 5/27.  Nausea and vomiting reported in chart, but seems to be improving.  She appears to be having liquid stool from rectal tube.  Pharmacy has been consulted to replace potassium  Goal of Therapy:  Potassium WNL  Plan:  06/01 @ 0012 K 3.3. Patient appears to have been trending below normal after a consecutive of 80 mEq of KCI total during the day. Will adjust fluids for NS + 20 mEq KCI @ 125 ml/hr which will provide 2.5 mEq of KCI per hr for a total daily KCI replacement of 60 mEq; however, patient has plans for surgery sometime today. Patient is also set to received two runs of 10 mEq KCI IV x 2 -- which will theoretical bring patient's K+ up to 4.0 (0.2 mEq from 2 K+ runs and 5 mEq of K+ from the fluids) mEq/L total by the time AM labs are drawn (in 2 hours). Will continue to monitor and replace as necessary.  Tobie Lords ,PharmD 10/05/2019 3:20 AM

## 2019-10-05 NOTE — Progress Notes (Signed)
Patients repeat potassium is 2.8. Per anesthesia has to be at least 3.0 for surgery. I secure chatted the hospitalist.

## 2019-10-05 NOTE — Progress Notes (Signed)
Pharmacy Antibiotic Note  Sydney Evans is a 69 y.o. female admitted on 10/02/2019 with sepsis.  Pharmacy has been consulted for Cefepime and Vancomycin dosing. MD to review need for abx.   Plan: Adjusted cefepime 2 g q12h to cefepime 2 g q8H due CrCl > 60 ml/min.   Adjusted vancomycin 1000 mg q24h to 750 mg q12H for a predicted trough of 16.    Height: 5\' 3"  (160 cm) Weight: 76.2 kg (168 lb) IBW/kg (Calculated) : 52.4  Temp (24hrs), Avg:98.6 F (37 C), Min:97.8 F (36.6 C), Max:99 F (37.2 C)  Recent Labs  Lab 10/02/19 2145 10/02/19 2145 10/03/19 0100 10/03/19 0150 10/03/19 1046 10/04/19 0530 10/04/19 0602 10/04/19 0848 10/05/19 0012 10/05/19 0630  WBC 14.0*   < >  --  14.4* 14.1* 11.1*  --   --  9.3 9.0  CREATININE 1.37*  --   --  1.66* 1.39* 1.06*  --   --   --  0.72  LATICACIDVEN 4.4*   < > 4.0* 4.4* 2.7*  --  1.6 1.6  --   --    < > = values in this interval not displayed.    Estimated Creatinine Clearance: 66.7 mL/min (by C-G formula based on SCr of 0.72 mg/dL).    Allergies  Allergen Reactions  . Hydroxyzine Hives and Rash  . Gabapentin     Dizzy and confusion  . Hydrocodone Hives and Rash    "terrible scratching"  Make take with benadryl  . Ketorolac Rash     May take with benadryl  . Percocet [Oxycodone-Acetaminophen] Itching and Rash    May take with benadryl    . Toradol [Ketorolac Tromethamine] Rash    May take with benadryl    Antimicrobials this admission: 5/29 cefepime  >>   5/29 vancomycin  >>   Microbiology results:  BCx: NGTD   Thank you for allowing pharmacy to be a part of this patient's care.  Oswald Hillock, PharmD, BCPS Clinical Pharmacist 10/05/2019 8:55 AM

## 2019-10-06 ENCOUNTER — Inpatient Hospital Stay (HOSPITAL_COMMUNITY)
Admit: 2019-10-06 | Discharge: 2019-10-06 | Disposition: A | Discharge status: Home / Self Care | Payer: Medicare Other | Attending: Internal Medicine | Admitting: Internal Medicine

## 2019-10-06 ENCOUNTER — Inpatient Hospital Stay: Payer: Medicare Other

## 2019-10-06 DIAGNOSIS — I4729 Other ventricular tachycardia: Secondary | ICD-10-CM

## 2019-10-06 DIAGNOSIS — S43004A Unspecified dislocation of right shoulder joint, initial encounter: Secondary | ICD-10-CM

## 2019-10-06 DIAGNOSIS — Z01818 Encounter for other preprocedural examination: Secondary | ICD-10-CM

## 2019-10-06 DIAGNOSIS — I6529 Occlusion and stenosis of unspecified carotid artery: Secondary | ICD-10-CM

## 2019-10-06 DIAGNOSIS — I472 Ventricular tachycardia: Secondary | ICD-10-CM

## 2019-10-06 DIAGNOSIS — I9589 Other hypotension: Secondary | ICD-10-CM

## 2019-10-06 DIAGNOSIS — S43004D Unspecified dislocation of right shoulder joint, subsequent encounter: Secondary | ICD-10-CM

## 2019-10-06 HISTORY — DX: Unspecified dislocation of right shoulder joint, initial encounter: S43.004A

## 2019-10-06 LAB — BASIC METABOLIC PANEL
Anion gap: 7 (ref 5–15)
Anion gap: 11 (ref 5–15)
BUN: 14 mg/dL (ref 8–23)
BUN: 15 mg/dL (ref 8–23)
CO2: 17 mmol/L — ABNORMAL LOW (ref 22–32)
CO2: 14 mmol/L — ABNORMAL LOW (ref 22–32)
Calcium: 7.2 mg/dL — ABNORMAL LOW (ref 8.9–10.3)
Calcium: 7.4 mg/dL — ABNORMAL LOW (ref 8.9–10.3)
Chloride: 112 mmol/L — ABNORMAL HIGH (ref 98–111)
Chloride: 108 mmol/L (ref 98–111)
Creatinine, Ser: 0.65 mg/dL (ref 0.44–1.00)
Creatinine, Ser: 0.62 mg/dL (ref 0.44–1.00)
GFR calc Af Amer: >60 mL/min (ref >60)
GFR calc Af Amer: >60 mL/min (ref >60)
GFR calc non Af Amer: >60 mL/min (ref >60)
GFR calc non Af Amer: >60 mL/min (ref >60)
Glucose, Bld: 73 mg/dL (ref 70–99)
Glucose, Bld: 76 mg/dL (ref 70–99)
Potassium: 2.7 mmol/L — CL (ref 3.5–5.1)
Potassium: 2.9 mmol/L — ABNORMAL LOW (ref 3.5–5.1)
Sodium: 136 mmol/L (ref 135–145)
Sodium: 133 mmol/L — ABNORMAL LOW (ref 135–145)

## 2019-10-06 LAB — CBC
HCT: 24.5 % — ABNORMAL LOW (ref 36.0–46.0)
Hemoglobin: 8 g/dL — ABNORMAL LOW (ref 12.0–15.0)
MCH: 22.5 pg — ABNORMAL LOW (ref 26.0–34.0)
MCHC: 32.7 g/dL (ref 30.0–36.0)
MCV: 69 fL — ABNORMAL LOW (ref 80.0–100.0)
Platelets: 341 10*3/uL (ref 150–400)
RBC: 3.55 MIL/uL — ABNORMAL LOW (ref 3.87–5.11)
RDW: 17.5 % — ABNORMAL HIGH (ref 11.5–15.5)
WBC: 8.8 10*3/uL (ref 4.0–10.5)
nRBC: 0.2 % (ref 0.0–0.2)

## 2019-10-06 LAB — MAGNESIUM
Magnesium: 1.5 mg/dL — ABNORMAL LOW (ref 1.7–2.4)
Magnesium: 2.6 mg/dL — ABNORMAL HIGH (ref 1.7–2.4)

## 2019-10-06 LAB — ECHOCARDIOGRAM COMPLETE
Height: 63 in
Weight: 2585.6 oz

## 2019-10-06 LAB — POTASSIUM: Potassium: 3.1 mmol/L — ABNORMAL LOW (ref 3.5–5.1)

## 2019-10-06 MED ORDER — PROPOFOL 10 MG/ML IV BOLUS
INTRAVENOUS | Status: AC
  Filled 2019-10-06: qty 20

## 2019-10-06 MED ORDER — POTASSIUM CHLORIDE 10 MEQ/100ML IV SOLN
10.0000 meq | INTRAVENOUS | Status: DC
  Administered 2019-10-06: 10 meq via INTRAVENOUS
  Filled 2019-10-06 (×4): qty 100

## 2019-10-06 MED ORDER — POTASSIUM CHLORIDE IN NACL 40-0.9 MEQ/L-% IV SOLN
INTRAVENOUS | Status: DC
  Administered 2019-10-06 – 2019-10-07 (×3): 100 mL/h via INTRAVENOUS
  Filled 2019-10-06 (×6): qty 1000

## 2019-10-06 MED ORDER — FENTANYL CITRATE (PF) 100 MCG/2ML IJ SOLN
INTRAMUSCULAR | Status: AC
  Filled 2019-10-06: qty 2

## 2019-10-06 MED ORDER — POTASSIUM CHLORIDE 10 MEQ/100ML IV SOLN
10.0000 meq | INTRAVENOUS | Status: AC
  Administered 2019-10-06 – 2019-10-07 (×4): 10 meq via INTRAVENOUS
  Filled 2019-10-06 (×4): qty 100

## 2019-10-06 MED ORDER — POTASSIUM CHLORIDE 10 MEQ/100ML IV SOLN
10.0000 meq | INTRAVENOUS | Status: AC
  Administered 2019-10-06 (×4): 10 meq via INTRAVENOUS
  Filled 2019-10-06 (×4): qty 100

## 2019-10-06 MED ORDER — LIDOCAINE HCL (PF) 2 % IJ SOLN
INTRAMUSCULAR | Status: AC
  Filled 2019-10-06: qty 5

## 2019-10-06 MED ORDER — MIDAZOLAM HCL 2 MG/2ML IJ SOLN
INTRAMUSCULAR | Status: AC
  Filled 2019-10-06: qty 2

## 2019-10-06 MED ORDER — POTASSIUM CHLORIDE 10 MEQ/100ML IV SOLN
10.0000 meq | INTRAVENOUS | Status: AC
  Administered 2019-10-06: 10 meq via INTRAVENOUS
  Filled 2019-10-06 (×2): qty 100

## 2019-10-06 MED ORDER — MAGNESIUM SULFATE 4 GM/100ML IV SOLN
4.0000 g | Freq: Once | INTRAVENOUS | Status: AC
  Administered 2019-10-06: 4 g via INTRAVENOUS
  Filled 2019-10-06: qty 100

## 2019-10-06 MED ORDER — HYDRALAZINE HCL 20 MG/ML IJ SOLN
10.0000 mg | Freq: Four times a day (QID) | INTRAMUSCULAR | Status: DC | PRN
  Administered 2019-10-06: 10 mg via INTRAVENOUS
  Filled 2019-10-06: qty 1

## 2019-10-06 NOTE — Progress Notes (Signed)
NGT output was 25 mL. NGT  removed at this time. Patient is very happy the tube is out. No N/V or any distension noted. Will continue to monitor.

## 2019-10-06 NOTE — Progress Notes (Signed)
Patient adamant about getting the NGT removed. Paged primary attending who notified my that it was up to general surgery. I added the PA to the secure chat. No response as of yet. Read the General surgery note dated 10/05/19  that said to clamp NGT to trial for abdominal pain. This writer clamped NGT while waiting for further instructions. Potassium level also resulted at 2.7 notified primary attended with this result.

## 2019-10-06 NOTE — Progress Notes (Signed)
[facility] SURGICAL PROGRESS NOTE (cpt [phone])  Hospital Day(s): 3.   Post op day(s): Day of Surgery.   Interval History: Patient seen and examined, no acute events or new complaints overnight. Patient is adamant regarding the removal of her NGT, this is her primary complaint. She denied any abdominal pain, distension, nausea, or emesis. NGT output ion last 24 hours was recorded around 800 ccs. She does have rectal tube output. KUB shows gas in colon but small bowel still remains dilated. She has persistent hypokalemia to 2.7, hypomagnesemia to 1.5, chronic anemia with hgb 8.0. Tentative plans for ORIF with Dr Roland Rack today.   Review of Systems:  Constitutional: denies fever, chills  HEENT: denies cough or congestion  Respiratory: denies any shortness of breath  Cardiovascular: denies chest pain or palpitations  Gastrointestinal: denies abdominal pain, N/V, or diarrhea/and bowel function as per interval history Genitourinary: denies burning with urination or urinary frequency Musculoskeletal: denies pain, decreased motor or sensation   Vital signs in last 24 hours: [min-max] current  Temp:  [97.7 F (36.5 C)-99.7 F (37.6 C)] 97.9 F (36.6 C) (06/02 0739) Pulse Rate:  [64-76] 64 (06/02 0739) Resp:  [15-19] 17 (06/02 0739) BP: (148-194)/(73-83) 168/74 (06/02 0739) SpO2:  [98 %-100 %] 99 % (06/02 0739) Weight:  [73.3 kg] 73.3 kg (06/02 0338)     Height: 5\' 3"  (160 cm) Weight: 73.3 kg BMI (Calculated): 28.63   Intake/Output last 2 shifts:  06/01 0701 - 06/02 0700 In: 703.5 [I.V.:403.5; IV Piggyback:300] Out: [id number] [Urine:5250; Emesis/NG output:800; Stool:425]   Physical Exam:  Constitutional: alert, cooperative and no distress  HENT: normocephalic without obvious abnormality, NGT in place Eyes: PERRL, EOM's grossly intact and symmetric  Respiratory: breathing non-labored at rest  Cardiovascular: regular rate and sinus rhythm  Gastrointestinal: soft, non-tender, and  non-distended, rectal tube in place Genitourinary: Foley in place Musculoskeletal: no edema or wounds, motor and sensation grossly intact, NT    Labs:  CBC Latest Ref Rng & Units 10/06/2019 10/05/2019 10/05/2019  WBC 4.0 - 10.5 K/uL 8.8 - -  Hemoglobin 12.0 - 15.0 g/dL 8.0(L) 8.2(L) 8.4(L)  Hematocrit 36.0 - 46.0 % 24.5(L) 25.9(L) 25.7(L)  Platelets 150 - 400 K/uL 341 - -   CMP Latest Ref Rng & Units 10/06/2019 10/05/2019 10/05/2019  Glucose 70 - 99 mg/dL 73 - -  BUN 8 - 23 mg/dL 14 - -  Creatinine 0.44 - 1.00 mg/dL 0.65 - -  Sodium 135 - 145 mmol/L 136 - -  Potassium 3.5 - 5.1 mmol/L 2.7(LL) 3.1(L) 2.8(L)  Chloride 98 - 111 mmol/L 112(H) - -  CO2 22 - 32 mmol/L 17(L) - -  Calcium 8.9 - 10.3 mg/dL 7.2(L) - -  Total Protein 6.5 - 8.1 g/dL - - -  Total Bilirubin 0.3 - 1.2 mg/dL - - -  Alkaline Phos 38 - 126 U/L - - -  AST 15 - 41 U/L - - -  ALT 0 - 44 U/L - - -     Imaging studies:  KUB (10/06/2019) personally reviewed with gas in the colon but small bowel remains dilated, and radiologist report is pending   Assessment/Plan: (ICD-10's: K48.7) 69 y.o. female with persistent hypokalemia, now with concomitant hypomagnesemia, we are following for post-operative ileus Day of Surgery for planned right shoulder ORIF   - Would recommend continuation of NGT decompression to LIS throughout the planned peri-operative course given her history of post-operative ileus. If surgery is postponed secondary to electrolyte derangement we can  reassess for potential removal.   - Continue IVF Resuscitation   - Aggressive electrolyte repletion; monitor    - Monitor abdominal examination; on-going bowel function   - Pain control prn; antiemetics prn  - further management per primary and consulting services; we will follow    All of the above findings and recommendations were discussed with the patient, and the medical team, and all of patient's questions were answered to her expressed  satisfaction.  -- Edison Simon, PA-C [facility] 10/06/2019, 8:28 AM [phone] M-F: 7am - 4pm

## 2019-10-06 NOTE — Consult Note (Signed)
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  Nausea and vomiting reported in chart, but seems to be improving.  She appears to be having liquid stool from rectal tube, and high output from NG tube.  Pharmacy has been consulted to replace potassium.  Goal of Therapy:  Potassium WNL  Plan:  Potassium remains low despite 6 runs IV potassium on 6/1 and 4 additional runs IV potassium this AM (resulting K increased 2.7 to 2.9).  Patient is also on continuous infusion of NS + 20 mEq/L at 125 mL/hr, which has been advanced to NS + 40 mEq/L at 100 mL/hr by MD.  Will continue potassium IV q 1h x 2 more doses this evening as ordered by MD.  Will recheck K at 2000 tonight.  Gerald Dexter ,PharmD 10/06/2019 4:20 PM

## 2019-10-06 NOTE — Consult Note (Addendum)
Cardiology Consultation:   Patient ID: Sydney Evans MRN: [id number]; DOB: 1950-08-20  Admit date: 10/02/2019 Stenosis, of Consult: 10/06/2019  Primary Care Provider: Patient, No Pcp Per Primary Cardiologist: new- [place] rounding Primary Electrophysiologist:  None     Patient Profile:   Sydney Evans is a 69 y.o. female with a hx of hypertension, carotid artery stenosis, who is being seen today for the evaluation of NSVT, preop eval at the request of Dr. Arbutus Ped.  History of Present Illness:   Sydney Evans is a 69 year old female with history of TIA, carotid stenosis, who is being seen due to nonsustained VT on cardiac telemetry.  Patient had a right shoulder replacement surgery about a week ago.  She was discharged on postop day 1.  On postop day 2, patient states feeling weak, abdominal distention and bloating and lack of bowel movement.  She presented to the emergency room and was diagnosed with ileus after abdominal imaging was performed.  Patient was also noted to be hypomagnesemic and hypokalemic.  While admitted, telemetry monitoring recorded in nonsustained VT. ( I could not find this tracing either in the chart or EMR.).  Repeat imaging of patient's shoulder apparently showed anterior dislocation of right shoulder arthroplasty.  Patient is scheduled to have shoulder relocated today.  Cardiology consulted due to NSVT.  She denies any history of heart disease.  Her electrolytes are currently being repleted.  Echocardiogram is being performed during my exam.  Labs today show magnesium of 1.5, potassium 2.7.  Due to constipation, patient was placed on an NG tube and also rectal tube.   Past Medical History:  Diagnosis Date  . Anemia   . Anxiety   . Arthritis   . Barrett's esophagus   . Bipolar disorder ([place])   . 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 Blood clot in vein   . Cancer ([place])    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  . Carotid arterial disease ([place])   . Contracture of joint of upper arm    . Depression   . Elevated lipids   . GERD (gastroesophageal reflux disease)   . Hypertension   . Migraine   . Osteoporosis   . Poor balance   . Sinus congestion   . Stroke ([place])    x 2  . TIA (transient ischemic attack)   . Vertigo     Past Surgical History:  Procedure Laterality Date  . ABDOMINAL HYSTERECTOMY    . BACK SURGERY    . BREAST BIOPSY Left   . CARPAL TUNNEL RELEASE Bilateral   . CATARACT EXTRACTION W/ INTRAOCULAR LENS  IMPLANT, BILATERAL Bilateral   . CHOLECYSTECTOMY    . COLONOSCOPY WITH PROPOFOL N/A 04/09/2018   Procedure: COLONOSCOPY WITH PROPOFOL;  Surgeon: Jonathon Bellows, MD;  Location[facility] ENDOSCOPY;  Service: Gastroenterology;  Laterality: N/A;  . crystal cyst removed on left foot    . ESOPHAGOGASTRODUODENOSCOPY (EGD) WITH PROPOFOL N/A 04/09/2018   Procedure: ESOPHAGOGASTRODUODENOSCOPY (EGD) WITH PROPOFOL;  Surgeon: Jonathon Bellows, MD;  Location: [facility] ENDOSCOPY;  Service: Gastroenterology;  Laterality: N/A;  . EYE SURGERY    . femoral fx    . FOOT SURGERY    . GIVENS CAPSULE STUDY N/A 06/16/2018   Procedure: GIVENS CAPSULE STUDY;  Surgeon: Jonathon Bellows, MD;  Location: [facility] ENDOSCOPY;  Service: Gastroenterology;  Laterality: N/A;  . JOINT REPLACEMENT    . KNEE SURGERY    . TONSILLECTOMY    . TOTAL KNEE ARTHROPLASTY Bilateral   . TOTAL SHOULDER ARTHROPLASTY Left 08/01/2016  Procedure: TOTAL SHOULDER ARTHROPLASTY;  Surgeon: Corky Mull, MD;  Location: [facility] ORS;  Service: Orthopedics;  Laterality: Left;  . TOTAL SHOULDER ARTHROPLASTY Right 09/30/2019   Procedure: TOTAL SHOULDER ARTHROPLASTY;  Surgeon: Corky Mull, MD;  Location: [facility] ORS;  Service: Orthopedics;  Laterality: Right;     Home Medications:  Prior to Admission medications   Medication Sig Start Date End Date Taking? Authorizing Provider  aspirin EC 325 MG tablet Take 1 tablet (325 mg total) by mouth daily. 09/30/19   Lattie Corns, PA-C  Carboxymethylcellulose Sodium (LUBRICANT EYE DROPS OP)  Place 1 drop into both eyes daily as needed (Dry eye). Systane    [provider]  citalopram (CELEXA) 40 MG tablet Take 40 mg by mouth daily.    [provider]  diclofenac Sodium (VOLTAREN) 1 % GEL Apply 1 application topically daily as needed. 09/13/19   [provider]  diphenhydrAMINE (BENADRYL) 25 MG tablet Take 25 mg by mouth every 6 (six) hours as needed for itching.    [provider]  fluticasone (FLONASE) 50 MCG/ACT nasal spray Place 2 sprays into both nostrils daily as needed for allergies or rhinitis.    [provider]  hydrochlorothiazide (HYDRODIURIL) 25 MG tablet Take 25 mg by mouth daily. 01/16/18   [provider]  IBU 800 MG tablet Take 800 mg by mouth daily as needed for mild pain or moderate pain.  09/25/17   [provider]  lamoTRIgine (LAMICTAL) 200 MG tablet Take 200 mg by mouth daily.    [provider]  lidocaine (LIDODERM) 5 % Place 1 patch onto the skin daily as needed (arthritis).  09/13/19   [provider]  lisinopril (PRINIVIL,ZESTRIL) 40 MG tablet Take 40 mg by mouth daily.    [provider]  omeprazole (PRILOSEC) 40 MG capsule Take 40 mg by mouth daily.    [provider]  oxyCODONE (OXY IR/ROXICODONE) 5 MG immediate release tablet Take 1-2 tablets (5-10 mg total) by mouth every 4 (four) hours as needed for moderate pain (pain score 4-6). 09/30/19   Lattie Corns, PA-C  tiZANidine (ZANAFLEX) 2 MG tablet Take 2 mg by mouth 2 (two) times daily as needed for muscle spasms.  09/10/19   [provider]  traMADol (ULTRAM) 50 MG tablet Take 1 tablet (50 mg total) by mouth every 6 (six) hours. 09/30/19   Lattie Corns, PA-C  Travoprost, BAK Free, (TRAVATAN) 0.004 % SOLN ophthalmic solution Place 1 drop into both eyes at bedtime. 08/10/19   [provider]    Inpatient Medications: Scheduled Meds: . Chlorhexidine Gluconate Cloth  6 each Topical Daily   . citalopram  40 mg Oral Daily  . lamoTRIgine  200 mg Oral Daily  . latanoprost  1 drop Both Eyes QHS  . pantoprazole (PROTONIX) IV  40 mg Intravenous Q12H   Continuous Infusions: . 0.9 % NaCl with KCl 20 mEq / L 125 mL/hr at 10/05/19 1509  .  ceFAZolin (ANCEF) IV    . ceFEPime (MAXIPIME) IV 2 g (10/06/19 0620)  . magnesium sulfate bolus IVPB 4 g (10/06/19 0846)  . metronidazole 500 mg (10/06/19 0600)  . potassium chloride 10 mEq (10/06/19 0847)  . vancomycin 750 mg (10/06/19 0040)   PRN Meds: diphenhydrAMINE, fentaNYL (SUBLIMAZE) injection, fluticasone, magnesium hydroxide, ondansetron **OR** ondansetron (ZOFRAN) IV, polyvinyl alcohol, tiZANidine, traZODone  Allergies:    Allergies  Allergen Reactions  . Hydroxyzine Hives and Rash  . Gabapentin  Dizzy and confusion  . Hydrocodone Hives and Rash    "terrible scratching"  Make take with benadryl  . Ketorolac Rash     May take with benadryl  . Percocet [Oxycodone-Acetaminophen] Itching and Rash    May take with benadryl    . Toradol [Ketorolac Tromethamine] Rash    May take with benadryl    Social History:   Social History   Socioeconomic History  . Marital status: Single    Spouse name: Not on file  . Number of children: 3  . Years of education: Not on file  . Highest education level: Some college, no degree  Occupational History  . Not on file  Tobacco Use  . Smoking status: Current Every Day Smoker    Packs/day: 0.50    Types: Cigarettes    Last attempt to quit: 08/04/2016    Years since quitting: 3.1  . Smokeless tobacco: Never Used  Substance and Sexual Activity  . Alcohol use: No  . Drug use: No  . Sexual activity: Not on file  Other Topics Concern  . Not on file  Social History Narrative   Lives at home alone in an apt   Right handed   Disabled since 1998   Caffeine: about 30 oz daily   Social Determinants of Health   Financial Resource Strain:   . Difficulty of Paying Living Expenses:    Food Insecurity:   . Worried About Charity fundraiser in the Last Year:   . Arboriculturist in the Last Year:   Transportation Needs:   . Film/video editor (Medical):   [name] Lack of Transportation (Non-Medical):   Physical Activity:   . Days of Exercise per Week:   . Minutes of Exercise per Session:   Stress:   . Feeling of Stress :   Social Connections:   . Frequency of Communication with Friends and Family:   . Frequency of Social Gatherings with Friends and Family:   . Attends Religious Services:   . Active Member of Clubs or Organizations:   . Attends Archivist Meetings:   [name] Marital Status:   Intimate Partner Violence:   . Fear of Current or Ex-Partner:   . Emotionally Abused:   [name] Physically Abused:   . Sexually Abused:     Family History:    Family History  Problem Relation Age of Onset  . Other Mother        ?lupus   . Cancer Mother   . High Cholesterol Mother   . CAD Father        CABG  . High Cholesterol Father   . Arthritis Father   . Cancer Sister   . Heart murmur Sister   . Heart murmur Brother   . High blood pressure Other        "for everybody"  . High Cholesterol Other        "for everybody"     ROS:  Please see the history of present illness.   All other ROS reviewed and negative.     Physical Exam/Data:   Vitals:   10/05/19 2003 10/06/19 0338 10/06/19 0500 10/06/19 0739  BP: (!) 171/83 (!) 178/78  (!) 168/74  Pulse: 75 75  64  Resp: 19 19 15 17   Temp: 98.7 F (37.1 C) 99.7 F (37.6 C)  97.9 F (36.6 C)  TempSrc: Oral Axillary  Oral  SpO2: 100% 99%  99%  Weight:  73.3 kg  Height:        Intake/Output Summary (Last 24 hours) at 10/06/2019 0906 Last data filed at 10/06/2019 0746 Gross per 24 hour  Intake 303.51 ml  Output 7325 ml  Net -7021.49 ml   Last 3 Weights 10/06/2019 10/03/2019 09/30/2019  Weight (lbs) 161 lb 9.6 oz 168 lb 160 lb 0.9 oz  Weight (kg) 73.301 kg 76.204 kg 72.6 kg     Body mass index is 28.63  kg/m.  General:  Well nourished, well developed, in no acute distress HEENT: normal Lymph: no adenopathy Neck: no JVD Endocrine:  No thryomegaly Vascular: No carotid bruits; FA pulses 2+ bilaterally without bruits  Cardiac:  normal S1, S2; RRR; no murmur  Lungs:  clear to auscultation bilaterally, no wheezing, rhonchi or rales  Abd: soft, nontender, no hepatomegaly  Ext: no edema Musculoskeletal: Right shoulder incision dressing noted Skin: warm and dry  Neuro:  CNs 2-12 intact, no focal abnormalities noted Psych:  Normal affect   EKG:  The EKG was personally reviewed and demonstrates: Sinus rhythm Telemetry:  Telemetry was personally reviewed and demonstrates: Sinus rhythm, occasional PVCs  Relevant CV Studies: Echocardiogram currently being performed, grossly shows normal systolic function, EF [id number].  Detailed report pending  Laboratory Data:  High Sensitivity Troponin:   Recent Labs  Lab 10/02/19 2145 10/03/19 0100  TROPONINIHS 76* 69*     Chemistry Recent Labs  Lab 10/04/19 0530 10/04/19 1649 10/05/19 0630 10/05/19 0630 10/05/19 1410 10/05/19 2148 10/06/19 0602  NA 135  --  134*  --   --   --  136  K 3.2*   < > 3.0*   < > 2.8* 3.1* 2.7*  CL 110  --  108  --   --   --  112*  CO2 15*  --  16*  --   --   --  17*  GLUCOSE 136*  --  88  --   --   --  73  BUN 27*  --  18  --   --   --  14  CREATININE 1.06*  --  0.72  --   --   --  0.65  CALCIUM 7.4*  --  7.3*  --   --   --  7.2*  GFRNONAA 54*  --  >60  --   --   --  >60  GFRAA >60  --  >60  --   --   --  >60  ANIONGAP 10  --  10  --   --   --  7   < > = values in this interval not displayed.    Recent Labs  Lab 10/02/19 2145 10/05/19 0630  PROT 5.4* 4.7*  ALBUMIN 2.6* 2.1*  AST 78* 29  ALT 22 14  ALKPHOS 66 52  BILITOT 0.7 0.6   Hematology Recent Labs  Lab 10/05/19 0012 10/05/19 0012 10/05/19 0630 10/05/19 0630 10/05/19 1345 10/05/19 2148 10/06/19 0602  WBC 9.3  --  9.0  --   --   --  8.8   RBC 3.54*  --  3.51*  --   --   --  3.55*  HGB 7.9*   < > 7.8*   < > 8.4* 8.2* 8.0*  HCT 24.9*   < > 24.0*   < > 25.7* 25.9* 24.5*  MCV 70.3*  --  68.4*  --   --   --  69.0*  MCH 22.3*  --  22.2*  --   --   --  22.5*  MCHC 31.7  --  32.5  --   --   --  32.7  RDW 17.4*  --  17.7*  --   --   --  17.5*  PLT 316  --  341  --   --   --  341   < > = values in this interval not displayed.   BNPNo results for input(s): BNP, PROBNP in the last 168 hours.  DDimer No results for input(s): DDIMER in the last 168 hours.   Radiology/Studies:  DG Chest 1 View  Result Date: 10/02/2019 CLINICAL DATA:  Altered mental status. Vomiting. Right shoulder surgery 2 days ago. EXAM: CHEST  1 VIEW COMPARISON:  Chest radiograph 07/01/2019. Shoulder radiograph 09/30/2019 FINDINGS: Low lung volumes. Unchanged heart size and mediastinal contours. Streaky atelectasis in the lung bases. No confluent airspace disease. No pleural fluid or pneumothorax. Right shoulder arthroplasty, alignment not well assessed on this supine chest radiograph, however the humeral component is not definitively aligned with the glenoid. Right shoulder skin staples are noted. IMPRESSION: 1. Low lung volumes with streaky atelectasis in the lung bases. 2. Recent right shoulder arthroplasty. Glenohumeral alignment is not well assessed on this supine chest radiograph, however the humeral arthroplasty is not definitively aligned with the glenoid. Recommend dedicated shoulder radiograph for further evaluation. Electronically Signed   By: Keith Rake M.D.   On: 10/02/2019 22:11   DG Shoulder Right  Result Date: 10/03/2019 CLINICAL DATA:  Right shoulder dislocation EXAM: RIGHT SHOULDER - 2+ VIEW COMPARISON:  Sep 30, 2019 FINDINGS: The patient is status post right shoulder arthroplasty. There is an anterior dislocation of the humeral head. Overlying soft tissue swelling seen. Surgical staples are noted. IMPRESSION: Anterior dislocation of right shoulder  arthroplasty. Electronically Signed   By: Prudencio Pair M.D.   On: 10/03/2019 01:50   DG Abd 1 View  Result Date: 10/03/2019 CLINICAL DATA:  Check gastric catheter placement EXAM: ABDOMEN - 1 VIEW COMPARISON:  None. FINDINGS: Scattered large and small bowel gas is noted. Gastric catheter is noted within the stomach. No free air is seen. IMPRESSION: Gastric catheter within the stomach. Electronically Signed   By: Inez Catalina M.D.   On: 10/03/2019 19:51   DG Abd 1 View  Result Date: 10/03/2019 CLINICAL DATA:  69 year old female status post NG placement. EXAM: ABDOMEN - 1 VIEW COMPARISON:  Abdominal radiograph dated 10/03/2019. FINDINGS: Partially visualized enteric tube with tip just distal to the GE junction. Recommend further advancing by additional 8-10 cm. IMPRESSION: Enteric tube with tip just distal to the GE junction. Electronically Signed   By: Anner Crete M.D.   On: 10/03/2019 19:08   DG Abdomen 1 View  Result Date: 10/03/2019 CLINICAL DATA:  Abdominal pain EXAM: ABDOMEN - 1 VIEW COMPARISON:  Oct 03, 2019 FINDINGS: Tip of the NG tube is seen within the upper portion of the film with the tip just entering the GE junction. A mildly dilated air-filled loop of bowel seen within the mid abdomen. Stool seen down to the level of the rectum. IMPRESSION: Tip the NG tube just seen entering the GE junction. Electronically Signed   By: Prudencio Pair M.D.   On: 10/03/2019 01:29   CT ABDOMEN PELVIS W CONTRAST  Result Date: 10/02/2019 CLINICAL DATA:  Abdominal pain and vomiting. Right shoulder surgery 2 days ago. EXAM: CT ABDOMEN AND PELVIS WITH CONTRAST TECHNIQUE: Multidetector CT imaging of the abdomen and pelvis was performed using the standard protocol following bolus administration of  intravenous contrast. CONTRAST:  56mL OMNIPAQUE IOHEXOL 300 MG/ML  SOLN COMPARISON:  Radiograph earlier this day. Abdominal CT 02/27/2018, chest abdomen pelvis CTA 07/01/2019 FINDINGS: Lower chest: Lung bases are  clear. Hepatobiliary: Post cholecystectomy. There is intra and extrahepatic biliary ductal dilatation. The common bile duct measures approximately 13 mm at the porta hepatis. No obvious choledocholithiasis. No focal hepatic lesion. Pancreas: No ductal dilatation or inflammation. Spleen: Normal in size without focal abnormality. Adrenals/Urinary Tract: Mild left adrenal thickening. No evidence of adrenal nodule. No hydronephrosis or perinephric edema. Homogeneous renal enhancement. There is absent excretion from both kidneys on delayed phase imaging. Urinary bladder is physiologically distended without wall thickening. Stomach/Bowel: Fluid within the distal esophagus. Fluid-filled stomach. Mild Peri pyloric gastric wall thickening. Normal positioning of the ligament of Treitz. Fluid-filled small bowel that is not abnormally dilated. Small amount of mesenteric edema in the right lower quadrant. Air and liquid stool within the cecum. The ascending, transverse, and descending colon are dilated and fluid-filled. Gradual transition to less dilated sigmoid colon with liquid and solid stool. There is solid stool within the distal sigmoid colon. No colonic wall thickening or inflammation. There is no bowel pneumatosis. Vascular/Lymphatic: Aorto bi-iliac atherosclerosis. No aortic aneurysm. The portal vein is patent. No adenopathy. Reproductive: Uterus not seen, presumably surgically absent. Ovaries are quiescent. Other: Trace free fluid in the pelvis.  No free intra-abdominal air. 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 Facet hypertrophy throughout the lumbar spine. Kyphoplasty within lower thoracic vertebral compression fractures. There are no acute or suspicious osseous abnormalities. IMPRESSION: 1. Findings consistent with generalized ileus and slow transit. Fluid/liquid stool throughout the majority of the colon with gradual transition to formed stool in the sigmoid. Fluid-filled  small bowel and stomach. No transition point or obstruction. There is minimal mesenteric edema but no definite wall thickening or bowel inflammation. 2. Intra and extrahepatic biliary ductal dilatation postcholecystectomy, progressed from February 2021 exam. Recommend correlation with LFTs. If LFTs are normal, no further imaging follow-up is needed. If LFTs are elevated, recommend further evaluation with ERCP or MRCP, MRCP should only be considered if patient is able to tolerate breath hold technique. 3. Absent excretion from both kidneys on delayed phase imaging suggest underlying renal dysfunction. Aortic Atherosclerosis (ICD10-I70.0). Electronically Signed   By: Keith Rake M.D.   On: 10/02/2019 23:38   DG Abd 2 Views  Result Date: 10/04/2019 CLINICAL DATA:  Ileus, follow-up EXAM: ABDOMEN - 2 VIEW COMPARISON:  10/03/2019 FINDINGS: Mild prominence of right lower quadrant bowel loop. Otherwise unremarkable. Enteric tube is within the fundus of the stomach. IMPRESSION: Mild prominence of right lower quadrant bowel loop. Otherwise unremarkable. Electronically Signed   By: Macy Mis M.D.   On: 10/04/2019 08:00   DG Abd Portable 1 View  Result Date: 10/02/2019 CLINICAL DATA:  Hypotension and vomiting EXAM: PORTABLE ABDOMEN - 1 VIEW COMPARISON:  None. FINDINGS: There is a mildly dilated air-filled loop of probable cecum seen within the right lower quadrant. There is air seen down to the level of the rectum. IMPRESSION: Mildly dilated air-filled loop of bowel in the right lower quadrant, likely cecum which could be due to ileus versus partial bowel obstruction. Electronically Signed   By: Prudencio Pair M.D.   On: 10/02/2019 22:35   {   Assessment and Plan:   1.  Preop cardiac risk stratification -Apparent NSVT on telemetry monitor , currently showing occasional PVCs -Patient is without symptoms of chest pain, shortness of breath. -Echocardiogram  upon my review shows normal systolic function  with EF 60 to 65% -Electrolytes are currently being repleted.  Recommend repeating electrolytes K over 4, magnesium over 2.  -Once electrolytes are repleted, patient can proceed with surgical procedure from a cardiac perspective as there is no indication for additional testing.  She tolerated procedure last week without any cardiac events.  2.  Paroxysmal NSVT -Cannot find record of tracing. -Likely due to electrolyte abnormalities, hypomagnesemia, hypokalemia -Replete  electrolytes as mentioned above -Echo with no gross structural abnormalities  3.  History of carotid stenosis -Recommend aspirin and statin after surgical procedure if no contraindications.  Thank you for this consult.  We will follow with you.    Signed, Kate Sable, MD  10/06/2019 9:06 AM

## 2019-10-06 NOTE — Progress Notes (Signed)
Sydney Antigua, MD [address], [place], [place], [place], [address] [address], [place], [place], [place], [address] Phone: [phone]  Fax: [phone]   Subjective: Flowsheet documentation reporting brown stool in rectal tube output and patient's nurse today verified the same.  No red blood noted in NG or rectal tube.   Objective: Exam: Vital signs in last 24 hours: Vitals:   10/06/19 0338 10/06/19 0500 10/06/19 0739 10/06/19 1158  BP: (!) 178/78  (!) 168/74 (!) 170/70  Pulse: 75  64 70  Resp: 19 15 17 18   Temp: 99.7 F (37.6 C)  97.9 F (36.6 C) 98.2 F (36.8 C)  TempSrc: Axillary  Oral Oral  SpO2: 99%  99% 100%  Weight: 73.3 kg     Height:       Weight change:   Intake/Output Summary (Last 24 hours) at 10/06/2019 1334 Last data filed at 10/06/2019 1158 Gross per 24 hour  Intake 263.51 ml  Output 7560 ml  Net -7296.49 ml    General: No acute distress, AAO x3 Abd: Soft, NT/ND, No HSM.  Rectal tube in place with brown output Skin: Warm, no rashes Neck: Supple, Trachea midline   Lab Results: Lab Results  Component Value Date   WBC 8.8 10/06/2019   HGB 8.0 (L) 10/06/2019   HCT 24.5 (L) 10/06/2019   MCV 69.0 (L) 10/06/2019   PLT 341 10/06/2019   Micro Results: Recent Results (from the past 240 hour(s))  SARS CORONAVIRUS 2 (TAT 6-24 HRS) Nasopharyngeal Nasopharyngeal Swab     Status: None   Collection Time: 09/28/19 10:37 AM   Specimen: Nasopharyngeal Swab  Result Value Ref Range Status   SARS Coronavirus 2 NEGATIVE NEGATIVE Final    Comment: (NOTE) SARS-CoV-2 target nucleic acids are NOT DETECTED. The SARS-CoV-2 RNA is generally detectable in upper and lower respiratory specimens during the acute phase of infection. Negative results do not preclude SARS-CoV-2 infection, do not rule out co-infections with other pathogens, and should not be used as the sole basis for treatment or other patient management decisions. Negative results must be  combined with clinical observations, patient history, and epidemiological information. The expected result is Negative. Fact Sheet for Patients: SugarRoll.be Fact Sheet for Healthcare Providers: https://www.woods-mathews.com/ This test is not yet approved or cleared by the [place] FDA and  has been authorized for detection and/or diagnosis of SARS-CoV-2 by FDA under an Emergency Use Authorization (EUA). This EUA will remain  in effect (meaning this test can be used) for the duration of the COVID-19 declaration under Section 56 4(b)(1) of the Act, 21 U.S.C. section 360bbb-3(b)(1), unless the authorization is terminated or revoked sooner. Performed at [facility], [place] [address], [place], [place] [address]   Blood culture (routine x 2)     Status: None (Preliminary result)   Collection Time: 10/02/19  9:45 PM   Specimen: BLOOD  Result Value Ref Range Status   Specimen Description BLOOD LEFT ANTECUBITAL  Final   Special Requests   Final    BOTTLES DRAWN AEROBIC AND ANAEROBIC Blood Culture results may not be optimal due to an inadequate volume of blood received in culture bottles   Culture   Final    NO GROWTH 4 DAYS Performed at [facility], [address], [place], [place] [address]    Report Status PENDING  Incomplete  Blood culture (routine x 2)     Status: None (Preliminary result)   Collection Time: 10/02/19  9:48 PM  Specimen: BLOOD  Result Value Ref Range Status   Specimen Description BLOOD BLOOD LEFT ARM  Final   Special Requests   Final    BOTTLES DRAWN AEROBIC AND ANAEROBIC Blood Culture adequate volume   Culture   Final    NO GROWTH 4 DAYS Performed at [facility], [address], [place], [place] [address]    Report Status PENDING  Incomplete  SARS Coronavirus 2 by RT PCR (hospital order, performed in [facility] hospital lab) Nasopharyngeal Nasopharyngeal Swab     Status: None   Collection  Time: 10/02/19 11:48 PM   Specimen: Nasopharyngeal Swab  Result Value Ref Range Status   SARS Coronavirus 2 NEGATIVE NEGATIVE Final    Comment: (NOTE) SARS-CoV-2 target nucleic acids are NOT DETECTED. The SARS-CoV-2 RNA is generally detectable in upper and lower respiratory specimens during the acute phase of infection. The lowest concentration of SARS-CoV-2 viral copies this assay can detect is 250 copies / mL. A negative result does not preclude SARS-CoV-2 infection and should not be used as the sole basis for treatment or other patient management decisions.  A negative result may occur with improper specimen collection / handling, submission of specimen other than nasopharyngeal swab, presence of viral mutation(s) within the areas targeted by this assay, and inadequate number of viral copies (<250 copies / mL). A negative result must be combined with clinical observations, patient history, and epidemiological information. Fact Sheet for Patients:   StrictlyIdeas.no Fact Sheet for Healthcare Providers: BankingDealers.co.za This test is not yet approved or cleared  by the [place] FDA and has been authorized for detection and/or diagnosis of SARS-CoV-2 by FDA under an Emergency Use Authorization (EUA).  This EUA will remain in effect (meaning this test can be used) for the duration of the COVID-19 declaration under Section 564(b)(1) of the Act, 21 U.S.C. section 360bbb-3(b)(1), unless the authorization is terminated or revoked sooner. Performed at [facility], [place], [place] [address]   C Difficile Quick Screen w PCR reflex     Status: None   Collection Time: 10/03/19  4:08 AM   Specimen: STOOL  Result Value Ref Range Status   C Diff antigen NEGATIVE NEGATIVE Final   C Diff toxin NEGATIVE NEGATIVE Final   C Diff interpretation No C. difficile detected.  Final    Comment: Performed at [facility], [address] [place], [place] [address]  Gastrointestinal Panel by PCR , Stool     Status: None   Collection Time: 10/03/19  4:09 AM   Specimen: STOOL  Result Value Ref Range Status   Campylobacter species NOT DETECTED NOT DETECTED Final   Plesimonas shigelloides NOT DETECTED NOT DETECTED Final   Salmonella species NOT DETECTED NOT DETECTED Final   Yersinia enterocolitica NOT DETECTED NOT DETECTED Final   Vibrio species NOT DETECTED NOT DETECTED Final   Vibrio cholerae NOT DETECTED NOT DETECTED Final   Enteroaggregative E coli (EAEC) NOT DETECTED NOT DETECTED Final   Enteropathogenic E coli (EPEC) NOT DETECTED NOT DETECTED Final   Enterotoxigenic E coli (ETEC) NOT DETECTED NOT DETECTED Final   Shiga like toxin producing E coli (STEC) NOT DETECTED NOT DETECTED Final   Shigella/Enteroinvasive E coli (EIEC) NOT DETECTED NOT DETECTED Final   Cryptosporidium NOT DETECTED NOT DETECTED Final   Cyclospora cayetanensis NOT DETECTED NOT DETECTED Final   Entamoeba histolytica NOT DETECTED NOT DETECTED Final   Giardia lamblia NOT DETECTED NOT DETECTED Final   Adenovirus F40/41 NOT  DETECTED NOT DETECTED Final   Astrovirus NOT DETECTED NOT DETECTED Final   Norovirus GI/GII NOT DETECTED NOT DETECTED Final   Rotavirus A NOT DETECTED NOT DETECTED Final   Sapovirus (I, II, IV, and V) NOT DETECTED NOT DETECTED Final    Comment: Performed at [facility], [place], [place] [address]  C Difficile Quick Screen w PCR reflex     Status: None   Collection Time: 10/03/19  4:14 AM   Specimen: Stool  Result Value Ref Range Status   C Diff antigen NEGATIVE NEGATIVE Final   C Diff toxin NEGATIVE NEGATIVE Final   C Diff interpretation No C. difficile detected.  Final    Comment: Performed at [facility], [address] [place], [place] [address]   Studies/Results: [place] Abd 1 View  Result Date: 10/06/2019 CLINICAL DATA:  Ileus EXAM: ABDOMEN - 1 VIEW  COMPARISON:  10/04/2019 FINDINGS: Gaseous distension of small and large bowel without obstructive pattern or over distention. The colonic gaseous distension is is greater than before. The enteric tube tip is at the mid stomach. Defibrillator pad over the left chest. IMPRESSION: Nonobstructive bowel gas pattern with moderate small bowel and colonic gas. Electronically Signed   By: Monte Fantasia M.D.   On: 10/06/2019 11:07   Medications:  Scheduled Meds: . Chlorhexidine Gluconate Cloth  6 each Topical Daily  . citalopram  40 mg Oral Daily  . lamoTRIgine  200 mg Oral Daily  . latanoprost  1 drop Both Eyes QHS  . pantoprazole (PROTONIX) IV  40 mg Intravenous Q12H   Continuous Infusions: . 0.9 % NaCl with KCl 20 mEq / L 125 mL/hr at 10/05/19 1509  .  ceFAZolin (ANCEF) IV    . ceFEPime (MAXIPIME) IV 2 g (10/06/19 0620)  . metronidazole 500 mg (10/06/19 0600)  . potassium chloride 10 mEq (10/06/19 1317)  . vancomycin 750 mg (10/06/19 1148)   PRN Meds:.diphenhydrAMINE, fentaNYL (SUBLIMAZE) injection, fluticasone, magnesium hydroxide, ondansetron **OR** ondansetron (ZOFRAN) IV, polyvinyl alcohol, tiZANidine, traZODone   Assessment: Active Problems:   Ileus (HCC)   Hypotension   Pre-op evaluation   NSVT (nonsustained ventricular tachycardia) (HCC)    Plan: Hemoglobin has been stable with no evidence of active bleeding.  No red blood in the rectal tube  Continue work-up as per orthopedic team and primary team for her planned surgeries  Unless active bleeding occurs, would not recommend endoscopic intervention at this time given ongoing acute issues and stable hemoglobin with no active bleeding at this time  Follow-up in GI clinic closely with Dr. Vicente Males, primary gastroenterologist, to monitor hemoglobin and assess need for any colonoscopy at that time  Timing of NG tube removal as per surgery and primary team  Minimize narcotics as possible  GI service will sign off at this time.   Please page with any signs of active GI bleeding, worsening anemia or any other questions or concerns.  Please make GI clinic follow-up with Dr. Vicente Males within 2 to 3 weeks of discharge   LOS: 3 days   Sydney Antigua, MD 10/06/2019, 1:34 PM

## 2019-10-06 NOTE — Progress Notes (Signed)
PROGRESS NOTE    Darene Steimer   [id number]  DOB: [birth year]  PCP: Patient, No Pcp Per    DOA: 10/02/2019 LOS: 3   Brief Narrative   Sydney Evans is a 69 y.o. female admitted overnight to the hospitalist team with diffuse ileus.  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 Roland Rack.  Patient reports that since she got home, she started having abdominal distention, which progressed into nausea and vomiting.  In the ED, labs showed AKI with a creatinine of 1.37 from prior of 0.63, severe hypokalemia with potassium of 2.2, lactic acidosis of 4.4, and WBC of 14.  CT abdomen/pelvis showed generalized ileus with no transition point or evidence of bowel obstruction. The patient was empirically given IV cefepime and vancomycin and admitted for further management.  General surgery, GI, and orthopedics are following.     Assessment & Plan   Principal Problem:   Ileus ([place]) Active Problems:   Hypotension   NSVT (nonsustained ventricular tachycardia) (HCC)   Pre-op evaluation   Ileus - in setting of shoulder surgery 5/27.  General surgery following.  NG tube in place to low intermittent suction pending surgery.   NPO.  IV fluids.  As needed pain meds and antiemetics.  Monitor NG output.  Stop empiric antibiotics and monitor clinically.   Right shoulder dislocation -patient had right shoulder arthroplasty on 5/27.  Patient apparently had a fall at home after discharge resulting in dislocation.   Dr. Roland Rack planning for surgery once electrolytes at acceptable levels for anesthesia.  Fentanyl as needed for pain control. Cardiology consulted for surgical clearance.  Hypertension -likely exacerbated due to pain.  History of chronic hypertension for which she takes lisinopril 40 mg daily.  Hold lisinopril (NPO).   IV hydralazine as needed.  NSVT (nonsustained ventricular tachycardia) -patient had 11 beat run of V. tach on telemetry in setting of hypokalemia.   Continue telemetry monitoring.  Replace potassium and magnesium, target K>4.0, Mg>2.0.  Cardiology consulted.  Hypokalemia -persistent, acute on chronic.  Patient has long history of hypokalemia which she manages at home by drinking Gatorade.  Surgery for her shoulder is on hold until K greater than 3.0 per anesthesia.  Replacing aggressively by IV.  Hypomagnesemia -replacing with IV.  Monitor daily and replace for goal Mg>2.0.  Bradycardia -resolved.  Monitor.  Bipolar disorder -no acute issues.  Continue Lamictal.  GERD -continue PPI.  DVT prophylaxis: SCD's  Diet:  Diet Orders (From admission, onward)    Start     Ordered   10/05/19 0737  Diet NPO time specified Except for: Sips with Meds  Diet effective now    Question:  Except for  Answer:  [name] with Meds   10/05/19 0736            Code Status: DNR    Subjective 10/06/19    Patient seen at bedside this morning.  No acute events reported.  Potassium and magnesium remain low this morning.  Patient reports ongoing severe pain in the right shoulder, which is worse with any movement but okay at rest.  She denies fevers or chills.  Does confirm that she has chronically low potassium levels.   Disposition Plan & Communication   Status is: Inpatient  Remains inpatient appropriate because:Inpatient level of care appropriate due to severity of illness   Dispo: The patient is from: Home              Anticipated d/c is to: Home  Anticipated d/c date is: 3 days              Patient currently is not medically stable to d/c.   Family Communication: Niece, [name], updated by phone this afternoon, all questions were answered.  Very concerned patient waiting so long for surgery and being in pain.  Educated on importance of normal electrolytes level beforehand.   Consults, Procedures, Significant Events   Consultants:   Orthopedics  Cardiology  General surgery  Procedures:   None  Antimicrobials:    Vancomycin 5/29 >> 6/1  Cefepime 5/29 >> 6/2  Flagyl 5/30 >> 6/2   Objective   Vitals:   10/06/19 0338 10/06/19 0500 10/06/19 0739 10/06/19 1158  BP: (!) 178/78  (!) 168/74 (!) 170/70  Pulse: 75  64 70  Resp: 19 15 17 18   Temp: 99.7 F (37.6 C)  97.9 F (36.6 C) 98.2 F (36.8 C)  TempSrc: Axillary  Oral Oral  SpO2: 99%  99% 100%  Weight: 73.3 kg     Height:        Intake/Output Summary (Last 24 hours) at 10/06/2019 1459 Last data filed at 10/06/2019 1158 Gross per 24 hour  Intake 263.51 ml  Output 7560 ml  Net -7296.49 ml   Filed Weights   10/03/19 0524 10/06/19 0338  Weight: 76.2 kg 73.3 kg    Physical Exam:  General exam: awake, alert, no acute distress HEENT: NG tube in place, dry mucus membranes, hearing grossly normal  Respiratory system: CTAB, normal respiratory effort, no wheezes or rhonchi Cardiovascular system: normal S1/S2, RRR, no pedal edema.   Gastrointestinal system: soft, NT, ND, hypoactive bowel sounds. Central nervous system: A&O x4. no gross focal neurologic deficits, normal speech Extremities: moves all, no cyanosis, normal tone Psychiatry: normal mood, congruent affect, judgement and insight appear normal  Labs   Data Reviewed: I have personally reviewed following labs and imaging studies  CBC: Recent Labs  Lab 10/02/19 2145 10/03/19 0150 10/03/19 1046 10/03/19 1046 10/04/19 0530 10/04/19 0530 10/05/19 0012 10/05/19 0630 10/05/19 1345 10/05/19 2148 10/06/19 0602  WBC 14.0*   < > 14.1*  --  11.1*  --  9.3 9.0  --   --  8.8  NEUTROABS 12.6*  --   --   --   --   --   --   --   --   --   --   HGB 8.9*   < > 11.2*   < > 9.3*   < > 7.9* 7.8* 8.4* 8.2* 8.0*  HCT 29.5*   < > 35.9*   < > 29.8*   < > 24.9* 24.0* 25.7* 25.9* 24.5*  MCV 74.9*   < > 71.5*  --  71.3*  --  70.3* 68.4*  --   --  69.0*  PLT 402*   < > 421*  --  402*  --  316 341  --   --  341   < > = values in this interval not displayed.   Basic Metabolic Panel: Recent Labs   Lab 10/02/19 2145 10/03/19 0150 10/03/19 1046 10/03/19 1046 10/04/19 0530 10/04/19 1649 10/05/19 0630 10/05/19 1410 10/05/19 2148 10/06/19 0602 10/06/19 1403  NA 131*   < > 131*  --  135  --  134*  --   --  136 133*  K 2.2*   < > 4.2   < > 3.2*   < > 3.0* 2.8* 3.1* 2.7* 2.9*  CL 106   < >  105  --  110  --  108  --   --  112* 108  CO2 14*   < > 14*  --  15*  --  16*  --   --  17* 14*  GLUCOSE 167*   < > 143*  --  136*  --  88  --   --  73 76  BUN 15   < > 25*  --  27*  --  18  --   --  14 15  CREATININE 1.37*   < > 1.39*  --  1.06*  --  0.72  --   --  0.65 0.62  CALCIUM 7.1*   < > 7.7*  --  7.4*  --  7.3*  --   --  7.2* 7.4*  MG 1.7  --  2.4  --  2.3  --   --   --   --  1.5* 2.6*   < > = values in this interval not displayed.   GFR: Estimated Creatinine Clearance: 65.5 mL/min (by C-G formula based on SCr of 0.62 mg/dL). Liver Function Tests: Recent Labs  Lab 10/02/19 2145 10/05/19 0630  AST 78* 29  ALT 22 14  ALKPHOS 66 52  BILITOT 0.7 0.6  PROT 5.4* 4.7*  ALBUMIN 2.6* 2.1*   Recent Labs  Lab 10/02/19 2145  LIPASE 145*   No results for input(s): AMMONIA in the last 168 hours. Coagulation Profile: Recent Labs  Lab 10/03/19 0150  INR 1.2   Cardiac Enzymes: No results for input(s): CKTOTAL, CKMB, CKMBINDEX, TROPONINI in the last 168 hours. BNP (last 3 results) No results for input(s): PROBNP in the last 8760 hours. HbA1C: No results for input(s): HGBA1C in the last 72 hours. CBG: Recent Labs  Lab 10/04/19 2035  GLUCAP 89   Lipid Profile: No results for input(s): CHOL, HDL, LDLCALC, TRIG, CHOLHDL, LDLDIRECT in the last 72 hours. Thyroid Function Tests: No results for input(s): TSH, T4TOTAL, FREET4, T3FREE, THYROIDAB in the last 72 hours. Anemia Panel: No results for input(s): VITAMINB12, FOLATE, FERRITIN, TIBC, IRON, RETICCTPCT in the last 72 hours. Sepsis Labs: Recent Labs  Lab 10/03/19 0150 10/03/19 1046 10/04/19 0602 10/04/19 0848   LATICACIDVEN 4.4* 2.7* 1.6 1.6    Recent Results (from the past 240 hour(s))  SARS CORONAVIRUS 2 (TAT 6-24 HRS) Nasopharyngeal Nasopharyngeal Swab     Status: None   Collection Time: 09/28/19 10:37 AM   Specimen: Nasopharyngeal Swab  Result Value Ref Range Status   SARS Coronavirus 2 NEGATIVE NEGATIVE Final    Comment: (NOTE) SARS-CoV-2 target nucleic acids are NOT DETECTED. The SARS-CoV-2 RNA is generally detectable in upper and lower respiratory specimens during the acute phase of infection. Negative results do not preclude SARS-CoV-2 infection, do not rule out co-infections with other pathogens, and should not be used as the sole basis for treatment or other patient management decisions. Negative results must be combined with clinical observations, patient history, and epidemiological information. The expected result is Negative. Fact Sheet for Patients: SugarRoll.be Fact Sheet for Healthcare Providers: https://www.woods-mathews.com/ This test is not yet approved or cleared by the [place] FDA and  has been authorized for detection and/or diagnosis of SARS-CoV-2 by FDA under an Emergency Use Authorization (EUA). This EUA will remain  in effect (meaning this test can be used) for the duration of the COVID-19 declaration under Section 56 4(b)(1) of the Act, 21 U.S.C. section 360bbb-3(b)(1), unless the authorization is terminated or revoked sooner. Performed at  [facility], [place] [address], [place], [place] [address]   Blood culture (routine x 2)     Status: None (Preliminary result)   Collection Time: 10/02/19  9:45 PM   Specimen: BLOOD  Result Value Ref Range Status   Specimen Description BLOOD LEFT ANTECUBITAL  Final   Special Requests   Final    BOTTLES DRAWN AEROBIC AND ANAEROBIC Blood Culture results may not be optimal due to an inadequate volume of blood received in culture bottles   Culture   Final    NO GROWTH 4  DAYS Performed at [facility], [address], [place], [place] [address]    Report Status PENDING  Incomplete  Blood culture (routine x 2)     Status: None (Preliminary result)   Collection Time: 10/02/19  9:48 PM   Specimen: BLOOD  Result Value Ref Range Status   Specimen Description BLOOD BLOOD LEFT ARM  Final   Special Requests   Final    BOTTLES DRAWN AEROBIC AND ANAEROBIC Blood Culture adequate volume   Culture   Final    NO GROWTH 4 DAYS Performed at [facility], [address], [place], [place] [address]    Report Status PENDING  Incomplete  SARS Coronavirus 2 by RT PCR (hospital order, performed in [place] hospital lab) Nasopharyngeal Nasopharyngeal Swab     Status: None   Collection Time: 10/02/19 11:48 PM   Specimen: Nasopharyngeal Swab  Result Value Ref Range Status   SARS Coronavirus 2 NEGATIVE NEGATIVE Final    Comment: (NOTE) SARS-CoV-2 target nucleic acids are NOT DETECTED. The SARS-CoV-2 RNA is generally detectable in upper and lower respiratory specimens during the acute phase of infection. The lowest concentration of SARS-CoV-2 viral copies this assay can detect is 250 copies / mL. A negative result does not preclude SARS-CoV-2 infection and should not be used as the sole basis for treatment or other patient management decisions.  A negative result may occur with improper specimen collection / handling, submission of specimen other than nasopharyngeal swab, presence of viral mutation(s) within the areas targeted by this assay, and inadequate number of viral copies (<250 copies / mL). A negative result must be combined with clinical observations, patient history, and epidemiological information. Fact Sheet for Patients:   StrictlyIdeas.no Fact Sheet for Healthcare Providers: BankingDealers.co.za This test is not yet approved or cleared  by the [place] FDA and has been authorized for  detection and/or diagnosis of SARS-CoV-2 by FDA under an Emergency Use Authorization (EUA).  This EUA will remain in effect (meaning this test can be used) for the duration of the COVID-19 declaration under Section 564(b)(1) of the Act, 21 U.S.C. section 360bbb-3(b)(1), unless the authorization is terminated or revoked sooner. Performed at [facility], [place], [place] [address]   C Difficile Quick Screen w PCR reflex     Status: None   Collection Time: 10/03/19  4:08 AM   Specimen: STOOL  Result Value Ref Range Status   C Diff antigen NEGATIVE NEGATIVE Final   C Diff toxin NEGATIVE NEGATIVE Final   C Diff interpretation No C. difficile detected.  Final    Comment: Performed at [facility], [place] [place],  [address]  Gastrointestinal Panel by PCR , Stool     Status: None   Collection Time: 10/03/19  4:09 AM   Specimen: STOOL  Result Value Ref Range Status   Campylobacter species NOT DETECTED NOT DETECTED Final  Plesimonas shigelloides NOT DETECTED NOT DETECTED Final   Salmonella species NOT DETECTED NOT DETECTED Final   Yersinia enterocolitica NOT DETECTED NOT DETECTED Final   Vibrio species NOT DETECTED NOT DETECTED Final   Vibrio cholerae NOT DETECTED NOT DETECTED Final   Enteroaggregative E coli (EAEC) NOT DETECTED NOT DETECTED Final   Enteropathogenic E coli (EPEC) NOT DETECTED NOT DETECTED Final   Enterotoxigenic E coli (ETEC) NOT DETECTED NOT DETECTED Final   Shiga like toxin producing E coli (STEC) NOT DETECTED NOT DETECTED Final   Shigella/Enteroinvasive E coli (EIEC) NOT DETECTED NOT DETECTED Final   Cryptosporidium NOT DETECTED NOT DETECTED Final   Cyclospora cayetanensis NOT DETECTED NOT DETECTED Final   Entamoeba histolytica NOT DETECTED NOT DETECTED Final   Giardia lamblia NOT DETECTED NOT DETECTED Final   Adenovirus F40/41 NOT DETECTED NOT DETECTED Final   Astrovirus NOT DETECTED NOT DETECTED Final   Norovirus  GI/GII NOT DETECTED NOT DETECTED Final   Rotavirus A NOT DETECTED NOT DETECTED Final   Sapovirus (I, II, IV, and V) NOT DETECTED NOT DETECTED Final    Comment: Performed at [facility], [address] [place], [place] [address]  C Difficile Quick Screen w PCR reflex     Status: None   Collection Time: 10/03/19  4:14 AM   Specimen: Stool  Result Value Ref Range Status   C Diff antigen NEGATIVE NEGATIVE Final   C Diff toxin NEGATIVE NEGATIVE Final   C Diff interpretation No C. difficile detected.  Final    Comment: Performed at [facility], [place], [place] [address]      Imaging Studies   DG Abd 1 View  Result Date: 10/06/2019 CLINICAL DATA:  Ileus EXAM: ABDOMEN - 1 VIEW COMPARISON:  10/04/2019 FINDINGS: Gaseous distension of small and large bowel without obstructive pattern or over distention. The colonic gaseous distension is is greater than before. The enteric tube tip is at the mid stomach. Defibrillator pad over the left chest. IMPRESSION: Nonobstructive bowel gas pattern with moderate small bowel and colonic gas. Electronically Signed   By: Monte Fantasia M.D.   On: 10/06/2019 11:07   ECHOCARDIOGRAM COMPLETE  Result Date: 10/06/2019    ECHOCARDIOGRAM REPORT   Patient Name:   Sydney Evans Date of Exam: 10/06/2019 Medical Rec #:  [id number]       Height:       63.0 in Accession #:    [id number]      Weight:       161.6 lb Date of Birth:  Jun 20, 1950       BSA:          1.766 m Patient Age:    69 years        BP:           168/74 mmHg Patient Gender: F               HR:           63 bpm. Exam Location:  [facility] Procedure: 2D Echo, Color Doppler and Cardiac Doppler Indications:     Preoperative evaluation  History:         Patient has prior history of Echocardiogram examinations. CAD,                  TIA; Risk Factors:Hypertension and Dyslipidemia.  Sonographer:     Charmayne Sheer RDCS (AE) Referring Phys:  IN:[id number] [place] Diagnosing Phys: Kate Sable MD  Sonographer Comments: Suboptimal  parasternal window. TDS due to inability to move pt due to dislocated rt shoulder and Image acquisition challenging due to respiratory motion. IMPRESSIONS  1. Left ventricular ejection fraction, by estimation, is 60 to 65%. The left ventricle has normal function. The left ventricle has no regional wall motion abnormalities. Left ventricular diastolic parameters are consistent with Grade I diastolic dysfunction (impaired relaxation).  2. Right ventricular systolic function is normal. The right ventricular size is normal.  3. The mitral valve is normal in structure. No evidence of mitral valve regurgitation. No evidence of mitral stenosis.  4. The aortic valve is grossly normal. Aortic valve regurgitation is not visualized. No aortic stenosis is present.  5. The inferior vena cava is normal in size with greater than 50% respiratory variability, suggesting right atrial pressure of 3 mmHg. FINDINGS  Left Ventricle: Left ventricular ejection fraction, by estimation, is 60 to 65%. The left ventricle has normal function. The left ventricle has no regional wall motion abnormalities. The left ventricular internal cavity size was normal in size. There is  no left ventricular hypertrophy. Left ventricular diastolic parameters are consistent with Grade I diastolic dysfunction (impaired relaxation). Right Ventricle: The right ventricular size is normal. No increase in right ventricular wall thickness. Right ventricular systolic function is normal. Left Atrium: Left atrial size was normal in size. Right Atrium: Right atrial size was normal in size. Pericardium: There is no evidence of pericardial effusion. Mitral Valve: The mitral valve is normal in structure. Normal mobility of the mitral valve leaflets. No evidence of mitral valve regurgitation. No evidence of mitral valve stenosis. MV peak gradient, 6.1 mmHg. The mean mitral valve gradient is 2.0 mmHg. Tricuspid Valve: The  tricuspid valve is normal in structure. Tricuspid valve regurgitation is not demonstrated. No evidence of tricuspid stenosis. Aortic Valve: The aortic valve is grossly normal. Aortic valve regurgitation is not visualized. No aortic stenosis is present. Aortic valve mean gradient measures 7.0 mmHg. Aortic valve peak gradient measures 16.6 mmHg. Aortic valve area, by VTI measures  2.98 cm. Pulmonic Valve: The pulmonic valve was not well visualized. Pulmonic valve regurgitation is not visualized. No evidence of pulmonic stenosis. Aorta: The aortic root is normal in size and structure. Venous: The inferior vena cava is normal in size with greater than 50% respiratory variability, suggesting right atrial pressure of 3 mmHg. IAS/Shunts: No atrial level shunt detected by color flow Doppler.  LEFT VENTRICLE PLAX 2D LVIDd:         4.03 cm     Diastology LVIDs:         2.47 cm     LV e' lateral:   7.72 cm/s LV PW:         1.04 cm     LV E/e' lateral: 8.6 LV IVS:        0.84 cm     LV e' medial:    5.66 cm/s LVOT diam:     2.20 cm     LV E/e' medial:  11.7 LV SV:         119 LV SV Index:   67 LVOT Area:     3.80 cm  LV Volumes (MOD) LV vol d, MOD A4C: 71.8 ml LV vol s, MOD A4C: 28.5 ml LV SV MOD A4C:     71.8 ml RIGHT VENTRICLE RV Basal diam:  2.99 cm LEFT ATRIUM             Index       RIGHT ATRIUM  Index LA diam:        3.70 cm 2.09 cm/m  RA Area:     10.50 cm LA Vol (A2C):   73.5 ml 41.62 ml/m RA Volume:   20.30 ml  11.49 ml/m LA Vol (A4C):   40.6 ml 22.99 ml/m LA Biplane Vol: 55.9 ml 31.65 ml/m  AORTIC VALVE AV Area (Vmax):    2.72 cm AV Area (Vmean):   2.86 cm AV Area (VTI):     2.98 cm AV Vmax:           204.00 cm/s AV Vmean:          119.000 cm/s AV VTI:            0.398 m AV Peak Grad:      16.6 mmHg AV Mean Grad:      7.0 mmHg LVOT Vmax:         146.00 cm/s LVOT Vmean:        89.600 cm/s LVOT VTI:          0.312 m LVOT/AV VTI ratio: 0.78  AORTA Ao Root diam: 3.00 cm MITRAL VALVE MV Area (PHT): 2.66  cm     SHUNTS MV Peak grad:  6.1 mmHg     Systemic VTI:  0.31 m MV Mean grad:  2.0 mmHg     Systemic Diam: 2.20 cm MV Vmax:       1.23 m/s MV Vmean:      70.8 cm/s MV Decel Time: 285 msec MV E velocity: 66.50 cm/s MV A velocity: 112.00 cm/s MV E/A ratio:  0.59 Kate Sable MD Electronically signed by Kate Sable MD Signature Date/Time: 10/06/2019/1:44:31 PM    Final      Medications   Scheduled Meds: . Chlorhexidine Gluconate Cloth  6 each Topical Daily  . citalopram  40 mg Oral Daily  . lamoTRIgine  200 mg Oral Daily  . latanoprost  1 drop Both Eyes QHS  . pantoprazole (PROTONIX) IV  40 mg Intravenous Q12H   Continuous Infusions: . 0.9 % NaCl with KCl 20 mEq / L 125 mL/hr at 10/05/19 1509  .  ceFAZolin (ANCEF) IV    . ceFEPime (MAXIPIME) IV 2 g (10/06/19 1410)  . metronidazole 500 mg (10/06/19 1451)  . vancomycin 750 mg (10/06/19 1148)       LOS: 3 days    Time spent: 45 minutes total, with > 50% spent in direct patient contact and coordination of care.    Ezekiel Slocumb, DO [facility]  10/06/2019, 2:59 PM    If 7PM-7AM, please contact night-coverage. How to contact the [facility] Attending or Consulting provider [place] or covering provider during after hours [place], for this patient?    1. Check the care team in [facility] and look for a) attending/consulting [facility] provider listed and b) the [facility] team listed 2. Log into [email or web address] and use [place]'s universal password to access. If you do not have the password, please contact the hospital operator. 3. Locate the [facility] provider you are looking for under [facility] and page to a number that you can be directly reached. 4. If you still have difficulty reaching the provider, please page the [facility] (Director on Call) for the Hospitalists listed on amion for assistance.

## 2019-10-06 NOTE — Consult Note (Signed)
PHARMACY CONSULT NOTE - FOLLOW UP  Pharmacy Consult for Electrolyte Monitoring and Replacement   Recent Labs: Potassium (mmol/L)  Date Value  10/06/2019 3.1 (L)   Magnesium (mg/dL)  Date Value  10/06/2019 2.6 (H)   Calcium (mg/dL)  Date Value  10/06/2019 7.4 (L)   Albumin (g/dL)  Date Value  10/05/2019 2.1 (L)   Sodium (mmol/L)  Date Value  10/06/2019 133 (L)     Assessment: 69 year old admitted with diffuse ileus, and recent right total shoulder arthroplasty on 5/27.  Nausea and vomiting reported in chart, but seems to be improving.  She appears to be having liquid stool from rectal tube, and high output from NG tube.  Pharmacy has been consulted to replace potassium.  Goal of Therapy:  Potassium WNL  Plan:  Potassium remains low despite 6 runs IV potassium on 6/1 and 4 additional runs IV potassium this AM (resulting K increased 2.7 to 2.9).  Patient is also on continuous infusion of NS + 20 mEq/L at 125 mL/hr, which has been advanced to NS + 40 mEq/L at 100 mL/hr by MD.  Will continue potassium IV q 1h x 2 more doses this evening as ordered by MD.  Will recheck K at 2000 tonight.  6/2: K @ 2004 = 3.1 Will order KCl 10 mEq IV X 4 and recheck electrolytes on 6/3 with AM labs.   Dameer Speiser D ,PharmD 10/06/2019 9:10 PM

## 2019-10-06 NOTE — Progress Notes (Signed)
Pharmacy Antibiotic Note  Sydney Evans is a 69 y.o. female admitted on 10/02/2019 with sepsis.  Pharmacy has been consulted for Cefepime and Vancomycin dosing. MD to review need for abx.   Plan: Day 5 antibiotics  Cefepime Continue cefepime 2g IV q8 hours   Vancomycin Currently on 750 mg IV q12 hours for a predicted trough of 17.6.  Pharmacy will monitor renal function and continue to monitor and adjust per consult.    Height: 5\' 3"  (160 cm) Weight: 73.3 kg (161 lb 9.6 oz) IBW/kg (Calculated) : 52.4  Temp (24hrs), Avg:98.4 F (36.9 C), Min:97.7 F (36.5 C), Max:99.7 F (37.6 C)  Recent Labs  Lab 10/02/19 2145 10/03/19 0100 10/03/19 0150 10/03/19 0150 10/03/19 1046 10/04/19 0530 10/04/19 0602 10/04/19 0848 10/05/19 0012 10/05/19 0630 10/06/19 0602 10/06/19 1403  WBC   < >  --  14.4*   < > 14.1* 11.1*  --   --  9.3 9.0 8.8  --   CREATININE   < >  --  1.66*   < > 1.39* 1.06*  --   --   --  0.72 0.65 0.62  LATICACIDVEN  --  4.0* 4.4*  --  2.7*  --  1.6 1.6  --   --   --   --    < > = values in this interval not displayed.    Estimated Creatinine Clearance: 65.5 mL/min (by C-G formula based on SCr of 0.62 mg/dL).    Allergies  Allergen Reactions  . Hydroxyzine Hives and Rash  . Gabapentin     Dizzy and confusion  . Hydrocodone Hives and Rash    "terrible scratching"  Make take with benadryl  . Ketorolac Rash     May take with benadryl  . Percocet [Oxycodone-Acetaminophen] Itching and Rash    May take with benadryl    . Toradol [Ketorolac Tromethamine] Rash    May take with benadryl    Antimicrobials this admission: 5/29 cefepime  >>   5/29 vancomycin  >>   Microbiology results: 5/29 BCx: NGTD  5/30 C diff neg (ag/tox) 5/30 GI panel 5/29 COVID negative  Thank you for allowing pharmacy to be a part of this patient's care.  Gerald Dexter, PharmD 10/06/2019 2:40 PM

## 2019-10-06 NOTE — Progress Notes (Signed)
NGT restarted at this time and will re-assess output  in 30 monutes as ordered.

## 2019-10-06 NOTE — Care Management Important Message (Signed)
Important Message  Patient Details  Name: Sydney Evans MRN: [id number] Date of Birth: [birth year]   Medicare Important Message Given:  Yes     Dannette Barbara 10/06/2019, 11:11 AM

## 2019-10-06 NOTE — Progress Notes (Signed)
*  PRELIMINARY RESULTS* Echocardiogram 2D Echocardiogram has been performed.  Sydney Evans 10/06/2019, 9:14 AM

## 2019-10-06 NOTE — Progress Notes (Signed)
Patient's Potassium this morning is 2.7. Paged and notified Rufina Falco.NP.  She called back and stated she's at home so I should notify the oncoming RN to let the rounding MD know.

## 2019-10-06 NOTE — Hospital Course (Signed)
Sydney Evans is a 69 y.o. female admitted overnight to the hospitalist team with diffuse ileus.  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 Roland Rack.  Patient reports that since she got home, she started having abdominal distention, which progressed into nausea and vomiting.  In the ED, labs showed AKI with a creatinine of 1.37 from prior of 0.63, severe hypokalemia with potassium of 2.2, lactic acidosis of 4.4, and WBC of 14.  CT abdomen/pelvis showed generalized ileus with no transition point or evidence of bowel obstruction. The patient was empirically given IV cefepime and vancomycin and admitted for further management.  General surgery, GI, and orthopedics are following.

## 2019-10-07 ENCOUNTER — Inpatient Hospital Stay: Payer: Medicare Other | Admitting: Anesthesiology

## 2019-10-07 ENCOUNTER — Inpatient Hospital Stay: Payer: Medicare Other

## 2019-10-07 ENCOUNTER — Encounter
Admission: EM | Disposition: A | Discharge status: Home Health | Payer: Self-pay | Source: Home / Self Care | Attending: Internal Medicine

## 2019-10-07 DIAGNOSIS — Z01810 Encounter for preprocedural cardiovascular examination: Secondary | ICD-10-CM

## 2019-10-07 DIAGNOSIS — E876 Hypokalemia: Secondary | ICD-10-CM | POA: Diagnosis present

## 2019-10-07 HISTORY — PX: ORIF PERIPROSTHETIC FRACTURE: SHX5034

## 2019-10-07 LAB — POTASSIUM
Potassium: 3.5 mmol/L (ref 3.5–5.1)
Potassium: 3.7 mmol/L (ref 3.5–5.1)

## 2019-10-07 LAB — CBC
HCT: 26.7 % — ABNORMAL LOW (ref 36.0–46.0)
Hemoglobin: 8.5 g/dL — ABNORMAL LOW (ref 12.0–15.0)
MCH: 22.3 pg — ABNORMAL LOW (ref 26.0–34.0)
MCHC: 31.8 g/dL (ref 30.0–36.0)
MCV: 69.9 fL — ABNORMAL LOW (ref 80.0–100.0)
Platelets: 353 10*3/uL (ref 150–400)
RBC: 3.82 MIL/uL — ABNORMAL LOW (ref 3.87–5.11)
RDW: 17.9 % — ABNORMAL HIGH (ref 11.5–15.5)
WBC: 8.9 10*3/uL (ref 4.0–10.5)
nRBC: 0.3 % — ABNORMAL HIGH (ref 0.0–0.2)

## 2019-10-07 LAB — BASIC METABOLIC PANEL
Anion gap: 10 (ref 5–15)
BUN: 15 mg/dL (ref 8–23)
CO2: 13 mmol/L — ABNORMAL LOW (ref 22–32)
Calcium: 7.7 mg/dL — ABNORMAL LOW (ref 8.9–10.3)
Chloride: 113 mmol/L — ABNORMAL HIGH (ref 98–111)
Creatinine, Ser: 0.65 mg/dL (ref 0.44–1.00)
GFR calc Af Amer: >60 mL/min (ref >60)
GFR calc non Af Amer: >60 mL/min (ref >60)
Glucose, Bld: 76 mg/dL (ref 70–99)
Potassium: 3.9 mmol/L (ref 3.5–5.1)
Sodium: 136 mmol/L (ref 135–145)

## 2019-10-07 LAB — CULTURE, BLOOD (ROUTINE X 2)
Culture: NO GROWTH
Culture: NO GROWTH
Special Requests: ADEQUATE

## 2019-10-07 LAB — MAGNESIUM
Magnesium: 2 mg/dL (ref 1.7–2.4)
Magnesium: 3.1 mg/dL — ABNORMAL HIGH (ref 1.7–2.4)

## 2019-10-07 SURGERY — OPEN REDUCTION INTERNAL FIXATION (ORIF) PERIPROSTHETIC FRACTURE
Anesthesia: General | Laterality: Right

## 2019-10-07 MED ORDER — PROPOFOL 10 MG/ML IV BOLUS
INTRAVENOUS | Status: DC | PRN
  Administered 2019-10-07: 150 mg via INTRAVENOUS

## 2019-10-07 MED ORDER — DEXAMETHASONE SODIUM PHOSPHATE 10 MG/ML IJ SOLN
INTRAMUSCULAR | Status: DC | PRN
  Administered 2019-10-07: 5 mg via INTRAVENOUS

## 2019-10-07 MED ORDER — BUPIVACAINE LIPOSOME 1.3 % IJ SUSP
INTRAMUSCULAR | Status: DC | PRN
  Administered 2019-10-07: 20 mL via PERINEURAL

## 2019-10-07 MED ORDER — BUPIVACAINE HCL (PF) 0.5 % IJ SOLN
INTRAMUSCULAR | Status: AC
  Filled 2019-10-07: qty 10

## 2019-10-07 MED ORDER — ENOXAPARIN SODIUM 40 MG/0.4ML ~~LOC~~ SOLN
40.0000 mg | SUBCUTANEOUS | Status: DC
  Administered 2019-10-08 – 2019-10-11 (×4): 40 mg via SUBCUTANEOUS
  Filled 2019-10-07 (×4): qty 0.4

## 2019-10-07 MED ORDER — POTASSIUM CHLORIDE CRYS ER 20 MEQ PO TBCR
40.0000 meq | EXTENDED_RELEASE_TABLET | Freq: Once | ORAL | Status: AC
  Administered 2019-10-07: 40 meq via ORAL
  Filled 2019-10-07: qty 2

## 2019-10-07 MED ORDER — SODIUM CHLORIDE 0.9 % IV SOLN
INTRAVENOUS | Status: DC | PRN
Start: 2019-10-07 — End: 2019-10-07

## 2019-10-07 MED ORDER — METOCLOPRAMIDE HCL 5 MG/ML IJ SOLN: 5.0000 mg | Freq: Three times a day (TID) | INTRAMUSCULAR | Status: DC | PRN

## 2019-10-07 MED ORDER — ONDANSETRON HCL 4 MG/2ML IJ SOLN
INTRAMUSCULAR | Status: DC | PRN
  Administered 2019-10-07: 4 mg via INTRAVENOUS

## 2019-10-07 MED ORDER — SODIUM CHLORIDE 0.9 % IV SOLN
INTRAVENOUS | Status: DC | PRN
  Administered 2019-10-07: 50 ug/min via INTRAVENOUS

## 2019-10-07 MED ORDER — PROPOFOL 10 MG/ML IV BOLUS
INTRAVENOUS | Status: AC
  Filled 2019-10-07: qty 20

## 2019-10-07 MED ORDER — TRAMADOL HCL 50 MG PO TABS
50.0000 mg | ORAL_TABLET | Freq: Four times a day (QID) | ORAL | Status: DC | PRN
  Administered 2019-10-08 – 2019-10-11 (×4): 50 mg via ORAL
  Filled 2019-10-07 (×5): qty 1

## 2019-10-07 MED ORDER — ONDANSETRON HCL 4 MG/2ML IJ SOLN: 4.0000 mg | Freq: Four times a day (QID) | INTRAMUSCULAR | Status: DC | PRN

## 2019-10-07 MED ORDER — DOCUSATE SODIUM 100 MG PO CAPS
100.0000 mg | ORAL_CAPSULE | Freq: Two times a day (BID) | ORAL | Status: DC
  Administered 2019-10-08 – 2019-10-09 (×3): 100 mg via ORAL
  Filled 2019-10-07 (×5): qty 1

## 2019-10-07 MED ORDER — BUPIVACAINE HCL (PF) 0.5 % IJ SOLN
INTRAMUSCULAR | Status: DC | PRN
Start: 2019-10-07 — End: 2019-10-07
  Administered 2019-10-07: 10 mL via PERINEURAL

## 2019-10-07 MED ORDER — ONDANSETRON HCL 4 MG/2ML IJ SOLN: 4.0000 mg | Freq: Once | INTRAMUSCULAR | Status: DC | PRN

## 2019-10-07 MED ORDER — PHENYLEPHRINE HCL (PRESSORS) 10 MG/ML IV SOLN
INTRAVENOUS | Status: DC | PRN
  Administered 2019-10-07: 100 ug via INTRAVENOUS
  Administered 2019-10-07: 200 ug via INTRAVENOUS
  Administered 2019-10-07 (×2): 100 ug via INTRAVENOUS
  Administered 2019-10-07: 200 ug via INTRAVENOUS

## 2019-10-07 MED ORDER — BISACODYL 10 MG RE SUPP: 10.0000 mg | Freq: Every day | RECTAL | Status: DC | PRN

## 2019-10-07 MED ORDER — BUPIVACAINE LIPOSOME 1.3 % IJ SUSP
INTRAMUSCULAR | Status: AC
  Filled 2019-10-07: qty 20

## 2019-10-07 MED ORDER — MIDAZOLAM HCL 2 MG/2ML IJ SOLN
1.0000 mg | INTRAMUSCULAR | Status: AC | PRN
  Administered 2019-10-07: 1 mg via INTRAVENOUS

## 2019-10-07 MED ORDER — MAGNESIUM HYDROXIDE 400 MG/5ML PO SUSP: 30.0000 mL | Freq: Every day | ORAL | Status: DC | PRN

## 2019-10-07 MED ORDER — METOCLOPRAMIDE HCL 10 MG PO TABS: 5.0000 mg | ORAL_TABLET | Freq: Three times a day (TID) | ORAL | Status: DC | PRN

## 2019-10-07 MED ORDER — EPHEDRINE 5 MG/ML INJ
INTRAVENOUS | Status: AC
  Filled 2019-10-07: qty 20

## 2019-10-07 MED ORDER — FENTANYL CITRATE (PF) 100 MCG/2ML IJ SOLN
INTRAMUSCULAR | Status: AC
  Filled 2019-10-07: qty 2

## 2019-10-07 MED ORDER — LIDOCAINE HCL (PF) 1 % IJ SOLN
INTRAMUSCULAR | Status: DC | PRN
  Administered 2019-10-07: .8 mL via SUBCUTANEOUS

## 2019-10-07 MED ORDER — LIDOCAINE HCL (PF) 1 % IJ SOLN
INTRAMUSCULAR | Status: AC
  Filled 2019-10-07: qty 5

## 2019-10-07 MED ORDER — ROCURONIUM BROMIDE 100 MG/10ML IV SOLN
INTRAVENOUS | Status: DC | PRN
Start: 2019-10-07 — End: 2019-10-07
  Administered 2019-10-07: 30 mg via INTRAVENOUS

## 2019-10-07 MED ORDER — SUCCINYLCHOLINE CHLORIDE 20 MG/ML IJ SOLN
INTRAMUSCULAR | Status: DC | PRN
  Administered 2019-10-07: 100 mg via INTRAVENOUS

## 2019-10-07 MED ORDER — MIDAZOLAM HCL 2 MG/2ML IJ SOLN
INTRAMUSCULAR | Status: AC
  Administered 2019-10-07: 1 mg via INTRAVENOUS
  Filled 2019-10-07: qty 2

## 2019-10-07 MED ORDER — GLYCOPYRROLATE 0.2 MG/ML IJ SOLN
INTRAMUSCULAR | Status: DC | PRN
  Administered 2019-10-07: .1 mg via INTRAVENOUS
  Administered 2019-10-07: .2 mg via INTRAVENOUS

## 2019-10-07 MED ORDER — DIPHENHYDRAMINE HCL 12.5 MG/5ML PO ELIX
12.5000 mg | ORAL_SOLUTION | ORAL | Status: DC | PRN
  Filled 2019-10-07: qty 10

## 2019-10-07 MED ORDER — FENTANYL CITRATE (PF) 100 MCG/2ML IJ SOLN: 50.0000 ug | INTRAMUSCULAR | Status: DC | PRN

## 2019-10-07 MED ORDER — EPINEPHRINE PF 1 MG/ML IJ SOLN
INTRAMUSCULAR | Status: AC
  Filled 2019-10-07: qty 1

## 2019-10-07 MED ORDER — BUPIVACAINE-EPINEPHRINE (PF) 0.5% -1:200000 IJ SOLN
INTRAMUSCULAR | Status: DC | PRN
  Administered 2019-10-07: 30 mL

## 2019-10-07 MED ORDER — FENTANYL CITRATE (PF) 100 MCG/2ML IJ SOLN
INTRAMUSCULAR | Status: DC | PRN
  Administered 2019-10-07 (×4): 25 ug via INTRAVENOUS

## 2019-10-07 MED ORDER — ONDANSETRON HCL 4 MG PO TABS: 4.0000 mg | ORAL_TABLET | Freq: Four times a day (QID) | ORAL | Status: DC | PRN

## 2019-10-07 MED ORDER — FLEET ENEMA 7-19 GM/118ML RE ENEM: 1.0000 | ENEMA | Freq: Once | RECTAL | Status: DC | PRN

## 2019-10-07 MED ORDER — POTASSIUM CHLORIDE 10 MEQ/100ML IV SOLN
10.0000 meq | INTRAVENOUS | Status: AC
  Administered 2019-10-07 (×3): 10 meq via INTRAVENOUS
  Filled 2019-10-07 (×3): qty 100

## 2019-10-07 MED ORDER — FENTANYL CITRATE (PF) 100 MCG/2ML IJ SOLN: 25.0000 ug | INTRAMUSCULAR | Status: DC | PRN

## 2019-10-07 MED ORDER — SUGAMMADEX SODIUM 200 MG/2ML IV SOLN
INTRAVENOUS | Status: DC | PRN
  Administered 2019-10-07: 150 mg via INTRAVENOUS

## 2019-10-07 MED ORDER — MAGNESIUM SULFATE 2 GM/50ML IV SOLN
2.0000 g | Freq: Once | INTRAVENOUS | Status: AC
  Administered 2019-10-07: 2 g via INTRAVENOUS

## 2019-10-07 MED ORDER — POTASSIUM CHLORIDE 10 MEQ/100ML IV SOLN
10.0000 meq | INTRAVENOUS | Status: AC
  Administered 2019-10-07 (×2): 10 meq via INTRAVENOUS
  Filled 2019-10-07 (×2): qty 100

## 2019-10-07 MED ORDER — EPHEDRINE SULFATE 50 MG/ML IJ SOLN
INTRAMUSCULAR | Status: DC | PRN
  Administered 2019-10-07: 20 mg via INTRAVENOUS
  Administered 2019-10-07 (×2): 10 mg via INTRAVENOUS
  Administered 2019-10-07: 20 mg via INTRAVENOUS
  Administered 2019-10-07: 10 mg via INTRAVENOUS

## 2019-10-07 MED ORDER — FENTANYL CITRATE (PF) 100 MCG/2ML IJ SOLN
INTRAMUSCULAR | Status: AC
  Administered 2019-10-07: 50 ug
  Filled 2019-10-07: qty 2

## 2019-10-07 MED ORDER — CEFAZOLIN SODIUM-DEXTROSE 2-4 GM/100ML-% IV SOLN
2.0000 g | Freq: Four times a day (QID) | INTRAVENOUS | Status: AC
  Administered 2019-10-07 – 2019-10-08 (×3): 2 g via INTRAVENOUS
  Filled 2019-10-07 (×3): qty 100

## 2019-10-07 MED ORDER — ENSURE PRE-SURGERY PO LIQD
296.0000 mL | Freq: Once | ORAL | Status: AC
  Administered 2019-10-07: 296 mL via ORAL
  Filled 2019-10-07: qty 296

## 2019-10-07 MED ORDER — LIDOCAINE 2% (20 MG/ML) 5 ML SYRINGE
INTRAMUSCULAR | Status: DC | PRN
  Administered 2019-10-07: 70 mg via INTRAVENOUS

## 2019-10-07 SURGICAL SUPPLY — 44 items
CANISTER SUCT 1200ML W/VALVE (MISCELLANEOUS) ×2 IMPLANT
CHLORAPREP W/TINT 26 (MISCELLANEOUS) ×2 IMPLANT
COOLER POLAR GLACIER W/PUMP (MISCELLANEOUS) ×2 IMPLANT
COVER WAND RF STERILE (DRAPES) ×2 IMPLANT
CRADLE LAMINECT ARM (MISCELLANEOUS) ×2 IMPLANT
DRAPE C-ARM XRAY 36X54 (DRAPES) ×2 IMPLANT
DRAPE INCISE IOBAN 66X45 STRL (DRAPES) ×4 IMPLANT
DRAPE SPLIT 6X30 W/TAPE (DRAPES) ×4 IMPLANT
DRSG OPSITE POSTOP 3X4 (GAUZE/BANDAGES/DRESSINGS) IMPLANT
DRSG OPSITE POSTOP 4X6 (GAUZE/BANDAGES/DRESSINGS) IMPLANT
DRSG OPSITE POSTOP 4X8 (GAUZE/BANDAGES/DRESSINGS) ×2 IMPLANT
ELECT REM PT RETURN 9FT ADLT (ELECTROSURGICAL) ×2
ELECTRODE REM PT RTRN 9FT ADLT (ELECTROSURGICAL) ×1 IMPLANT
GAUZE SPONGE 4X4 12PLY STRL (GAUZE/BANDAGES/DRESSINGS) ×2 IMPLANT
GAUZE XEROFORM 1X8 LF (GAUZE/BANDAGES/DRESSINGS) ×2 IMPLANT
GLOVE BIO SURGEON STRL SZ8 (GLOVE) ×4 IMPLANT
GLOVE INDICATOR 8.0 STRL GRN (GLOVE) ×2 IMPLANT
GOWN STRL REUS W/ TWL LRG LVL3 (GOWN DISPOSABLE) ×1 IMPLANT
GOWN STRL REUS W/ TWL XL LVL3 (GOWN DISPOSABLE) ×1 IMPLANT
GOWN STRL REUS W/TWL LRG LVL3 (GOWN DISPOSABLE) ×1
GOWN STRL REUS W/TWL XL LVL3 (GOWN DISPOSABLE) ×1
KIT STABILIZATION SHOULDER (MISCELLANEOUS) ×2 IMPLANT
KIT TURNOVER KIT A (KITS) ×2 IMPLANT
MASK FACE SPIDER DISP (MASK) ×2 IMPLANT
MAT ABSORB  FLUID 56X50 GRAY (MISCELLANEOUS) ×1
MAT ABSORB FLUID 56X50 GRAY (MISCELLANEOUS) ×1 IMPLANT
NS IRRIG 1000ML POUR BTL (IV SOLUTION) ×2 IMPLANT
PACK SHDR ARTHRO (MISCELLANEOUS) ×2 IMPLANT
PAD WRAPON POLAR SHDR UNIV (MISCELLANEOUS) ×1 IMPLANT
PULSAVAC PLUS IRRIG FAN TIP (DISPOSABLE) ×2
SLING ULTRA II M (MISCELLANEOUS) ×1 IMPLANT
STAPLER SKIN PROX 35W (STAPLE) ×2 IMPLANT
STRAP SAFETY 5IN WIDE (MISCELLANEOUS) ×2 IMPLANT
STRIP CLOSURE SKIN 1/2X4 (GAUZE/BANDAGES/DRESSINGS) ×1 IMPLANT
SUT ETHIBOND 0 MO6 C/R (SUTURE) ×1 IMPLANT
SUT FIBERWIRE #2 38 BLUE 1/2 (SUTURE) ×10
SUT PROLENE 4 0 PS 2 18 (SUTURE) IMPLANT
SUT VIC AB 0 CT1 36 (SUTURE) ×4 IMPLANT
SUT VIC AB 2-0 CT1 27 (SUTURE)
SUT VIC AB 2-0 CT1 TAPERPNT 27 (SUTURE) ×2 IMPLANT
SUTURE FIBERWR #2 38 BLUE 1/2 (SUTURE) IMPLANT
TAPE MICROFOAM 4IN (TAPE) ×1 IMPLANT
TIP FAN IRRIG PULSAVAC PLUS (DISPOSABLE) IMPLANT
WRAPON POLAR PAD SHDR UNIV (MISCELLANEOUS)

## 2019-10-07 NOTE — OR Nursing (Signed)
Per Dr. Ronelle Nigh, pt is to receive ensure pre surgery drink prior to surgery but has to be completed by 1100. [place], RN made aware. Surgery drink taken to floor by SDS staff and given to nurse

## 2019-10-07 NOTE — Anesthesia Procedure Notes (Signed)
Anesthesia Regional Block: Interscalene brachial plexus block   Pre-Anesthetic Checklist: ,, timeout performed, Correct Patient, Correct Site, Correct Laterality, Correct Procedure, Correct Position, site marked, Risks and benefits discussed,  Surgical consent,  Pre-op evaluation,  At surgeon's request and post-op pain management  Laterality: Right  Prep: chloraprep       Needles:  Injection technique: Single-shot  Needle Type: Echogenic Needle     Needle Length: 5cm  Needle Gauge: 21     Additional Needles:   Procedures:, nerve stimulator,,, ultrasound used (permanent image in chart),,,,   Nerve Stimulator or Paresthesia:  Response: biceps flexion, 0.8 mA,   Additional Responses:   Narrative:  Start time: 10/07/2019 2:56 PM End time: 10/07/2019 3:02 PM Injection made incrementally with aspirations every 5 mL.  Performed by: Personally   Additional Notes: Functioning IV was confirmed and monitors were applied.  A 42mm 22ga Stimuplex needle was used. Sterile prep and drape,hand hygiene and sterile gloves were used.  Negative aspiration and negative test dose prior to incremental administration of local anesthetic. The patient tolerated the procedure well.

## 2019-10-07 NOTE — Transfer of Care (Signed)
Immediate Anesthesia Transfer of Care Note  Patient: Sydney Evans  Procedure(s) Performed: OPEN REDUCTION INTERNAL FIXATION (ORIF) PERIPROSTHETIC FRACTURE (Right )  Patient Location: PACU  Anesthesia Type:General  Level of Consciousness: sedated  Airway & Oxygen Therapy: Patient Spontanous Breathing and Patient connected to face mask oxygen  Post-op Assessment: Report given to RN and Post -op Vital signs reviewed and stable  Post vital signs: Reviewed  Last Vitals:  Vitals Value Taken Time  BP 102/73 10/07/19 1734  Temp 37.1 C 10/07/19 1734  Pulse 79 10/07/19 1734  Resp 17 10/07/19 1734  SpO2 100 % 10/07/19 1734  Vitals shown include unvalidated device data.  Last Pain:  Vitals:   10/07/19 1420  TempSrc: Temporal  PainSc: 9       Patients Stated Pain Goal: 0 ([id number])  Complications: No apparent anesthesia complications

## 2019-10-07 NOTE — Progress Notes (Signed)
Lab at bedside drawing potassium level; second of 2 K+ runs started. Pt previously receive K+ runs x 3 and Kcl 40 meq. Patient reports improvement in pain level since receiving Fentanyl 25 mcg; now 6/10 from 9.5/10.

## 2019-10-07 NOTE — Progress Notes (Signed)
Repeat potassium late this morning of 3.5.  After discussion with rounding cardiologist, from a cardiac perspective patient is okay to proceed with noncardiac surgery.  Patient is receiving 2 rounds of IV potassium following this most recent lab value.  Patient may proceed with planned surgery with a potassium of 3.7.

## 2019-10-07 NOTE — H&P (Signed)
H&P, consult note, and progress notes have been reviewed and the patient re-examined.  The patient's laboratory results demonstrate that her potassium and magnesium levels have been replenished to level as requested by the cardiologist before proceeding with surgical intervention.  Again, the procedure of an open reduction and probable subscapularis tendon repair have been reviewed with the patient.  She is ready to proceed as planned.

## 2019-10-07 NOTE — OR Nursing (Signed)
Per cardiology pt needs to have K of 4 and mag of 2 prior to surgery. Verbal orders givne to recheck prior to surgery per Dr. Ronelle Nigh. Baldo Ash, RN made aware during report this am.

## 2019-10-07 NOTE — Progress Notes (Signed)
OR staff at bedside to transport pt to OR. Dentures removed at placed in cup at bedside. Cell phone also placed at bedside (from power cord on bed). Pt A&O, in NAD. Maint IVF with KCL at 100 ml/hr and Kcl run at 100 ml/hr continue to infuse via left FA IV x 2.

## 2019-10-07 NOTE — Anesthesia Postprocedure Evaluation (Signed)
Anesthesia Post Note  Patient: Sydney Evans  Procedure(s) Performed: OPEN REDUCTION INTERNAL FIXATION (ORIF) PERIPROSTHETIC FRACTURE (Right )  Patient location during evaluation: PACU Anesthesia Type: General Level of consciousness: awake and alert Pain management: pain level controlled Vital Signs Assessment: post-procedure vital signs reviewed and stable Respiratory status: spontaneous breathing, nonlabored ventilation, respiratory function stable and patient connected to nasal cannula oxygen Cardiovascular status: blood pressure returned to baseline and stable Postop Assessment: no apparent nausea or vomiting Anesthetic complications: no     Last Vitals:  Vitals:   10/07/19 1500 10/07/19 1734  BP: (!) 152/62 102/73  Pulse: 74 79  Resp: 18 16  Temp:  37.1 C  SpO2: 100% 100%    Last Pain:  Vitals:   10/07/19 1734  TempSrc:   PainSc: Asleep                 Arita Miss

## 2019-10-07 NOTE — Op Note (Signed)
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  Post-Op Diagnosis:   Same  Procedure:   Open reduction of anterior right total shoulder arthroplasty dislocation with repair of subscapularis tendon tear.  Surgeon:   Pascal Lux, MD  Assistant:   Cameron Proud, PA-C; Kirkland Hun, PA-S  Anesthesia:   General endotracheal intubation with an interscalene block using Exparel placed preoperatively by the anesthesiologist.  Findings:   As above.  The glenoid component was secure.  Complications:   None  EBL:  25 cc  Fluids:   600 cc crystalloid  UOP:   150 cc  TT:   None  Drains:   None  Closure:   Staples  Brief Clinical Note:   The patient is a 69 year old female who is now 1 week status post a right total shoulder arthroplasty. The patient initially was doing well and was discharged home on the following day. She developed an ileus over the next 2 days and became hypotensive. She was brought back to the emergency room and subsequently admitted for fluid resuscitation. She complained of right shoulder pain and x-rays demonstrated an anterior dislocation of the right shoulder prosthesis. The patient has been stabilized medically and presents at this time for an open reduction and possible repair of the subscapularis tendon of her right shoulder.  Procedure:   The patient underwent placement of an interscalene block using Exparel in the preoperative holding area by the anesthesiologist before she was brought into the operating room and lain in the supine position. After satisfactory general endotracheal intubation and anesthesia was achieved, the patient was repositioned in the beach chair position using the beach chair positioner. The right shoulder and upper extremity were prepped with ChloraPrep solution before being draped sterilely. Preoperative antibiotics were administered.   The staples were  removed from the previous anterior incision and the incision reopened and carried down through the subcutaneous tissues to expose the deltopectoral fascia, removing retained sutures along the way. The interval between the deltoid and pectoralis muscles was identified and this plane developed, retracting the cephalic vein laterally with the deltoid muscle and again removing any retained sutures in the process. The conjoined tendon was identified and the Kolbel self-retraining retractor inserted. The humeral head was readily identified and is anteriorly dislocated position. The subscapularis tendon clearly had torn. The retained FiberWire sutures were removed and the subscapularis tendon tagged with several #0 Ethibond interrupted sutures.    The shoulder was reduced and the arm placed through a range of motion.  The shoulder appeared to be somewhat tight posteriorly with crossed arm testing and had a tendency to sublux anteriorly in the apprehension position.  Therefore, it was felt best to remove the humeral head and perform a posterior capsular release.  While attempting to remove the humeral head, the humeral stem was noted to be loose, so it was removed as well.  At this point, the posterior capsule was identified and carefully released using Metzenbaum scissors while preserving the posterior portion of the rotator cuff behind it.  Attention was directed to the humeral side. The humeral canal was thoroughly irrigated using the pulse lavage system, as was the actual humeral stem. The humeral head/stem was reinserted with care taken to maintain at least 30 degrees of retroversion and impacted firmly. The stem appeared to be stable to rotational stresses so it was elected to stay with this stem construct. The shoulder was  relocated and again placed through a range of motion.  The shoulder appeared stable with extension and external rotation, and did not appear to be too tight with crossed arm testing.  In  addition, the head could be translated posteriorly 1/4 head diameter.  The wound was copiously irrigated with sterile saline solution using the jet lavage system before a total of 30 cc of 0.5% Sensorcaine with epinephrine was injected into the pericapsular and peri-incisional tissues to help with post-operative analgesia. The subscapularis tendon was reapproximated using #2 FiberWire interrupted sutures. The deltopectoral interval was closed using #0 Vicryl interrupted sutures before the subcutaneous tissues were closed using 2-0 Vicryl interrupted sutures. The skin was closed using staples. A sterile occlusive dressing was applied to the wound before the arm was placed into a shoulder immobilizer with an abduction pillow.The patient was then transferred back to a hospital bed before being awakened, extubated, and returned to the recovery room in satisfactory condition after tolerating the procedure well.

## 2019-10-07 NOTE — Progress Notes (Addendum)
PROGRESS NOTE    [name]   [id number]  DOB: [birth year]  PCP: Patient, No Pcp Per    DOA: 10/02/2019 LOS: 4   Brief Narrative   Sydney Evans is a 69 y.o. female admitted overnight to the hospitalist team with diffuse ileus.  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 Roland Rack.  Patient reports that since she got home, she started having abdominal distention, which progressed into nausea and vomiting.  In the ED, labs showed AKI with a creatinine of 1.37 from prior of 0.63, severe hypokalemia with potassium of 2.2, lactic acidosis of 4.4, and WBC of 14.  CT abdomen/pelvis showed generalized ileus with no transition point or evidence of bowel obstruction. The patient was empirically given IV cefepime and vancomycin and admitted for further management.  General surgery, GI, and orthopedics are following.     Assessment & Plan   Principal Problem:   Ileus ([place]) Active Problems:   NSVT (nonsustained ventricular tachycardia) (HCC)   Dislocation of right shoulder joint   Hypokalemia   Hypomagnesemia   Pre-op evaluation   Ileus - in setting of shoulder surgery 5/27.  General surgery following.  NG tube removed night on 6/2.  Diet per surgery team, currently NPO except sips w/meds.  IV fluids.  As needed pain meds and antiemetics. Empiric antibiotics stopped, continue to monitor clinically for signs of infection.   Right shoulder dislocation -patient had right shoulder arthroplasty on 5/27.  Patient apparently had a fall at home after discharge resulting in dislocation.   Dr. Roland Rack taking to surgery today, now that electrolyte levels improved.  Fentanyl as needed for pain control. Cardiology cleared patient for surgery.    Hypertension -likely exacerbated due to pain.  History of chronic hypertension for which she takes lisinopril 40 mg daily.  Hold lisinopril.   IV hydralazine as needed.  NSVT (nonsustained ventricular tachycardia) -patient had  11 beat run of V. tach on telemetry in setting of hypokalemia.  Continue telemetry monitoring.  Replace potassium and magnesium, target K>4.0, Mg>2.0.  Cardiology consulted.  Hypokalemia -persistent, acute on chronic.  Patient has long history of hypokalemia which she manages at home by drinking Gatorade.  Have been aggressively replacing throughout admission.  Daily BMP's.  Hypomagnesemia -replacing with IV.  Monitor daily and replace for goal Mg>2.0.  Bradycardia -resolved.  Monitor.  Bipolar disorder -no acute issues.  Continue Lamictal.  GERD -continue PPI.  DVT prophylaxis: SCD's  Diet:  Diet Orders (From admission, onward)    Start     Ordered   10/05/19 0737  Diet NPO time specified Except for: Sips with Meds  Diet effective now    Question:  Except for  Answer:  [name] with Meds   10/05/19 0736            Code Status: DNR    Subjective 10/07/19    Patient seen at bedside this morning.  No acute events reported.  NG tube was removed last night, patient very happy about that.  She reports feeling better today.  Pain currently well-controlled.  Denies fever/chills, CP, SOB, N/V or other acute complaints  Disposition Plan & Communication   Status is: Inpatient  Remains inpatient appropriate because:Inpatient level of care appropriate due to severity of illness   Dispo: The patient is from: Home              Anticipated d/c is to: Home  Anticipated d/c date is: 3 days              Patient currently is not medically stable to d/c.   Family Communication: none at bedside during encounter, will attempt to call   Consults, Procedures, Significant Events   Consultants:   Orthopedics  Cardiology  General surgery  Procedures:   None  Antimicrobials:   Vancomycin 5/29 >> 6/1  Cefepime 5/29 >> 6/2  Flagyl 5/30 >> 6/2   Objective   Vitals:   10/07/19 0500 10/07/19 0700 10/07/19 0742 10/07/19 1145  BP:   (!) 168/86 (!) 141/70  Pulse:   81  75  Resp: 15 16 18 18   Temp:   98.4 F (36.9 C) 98.3 F (36.8 C)  TempSrc:   Oral   SpO2:   99% 100%  Weight:      Height:        Intake/Output Summary (Last 24 hours) at 10/07/2019 1408 Last data filed at 10/07/2019 1300 Gross per 24 hour  Intake 4097.33 ml  Output 2975 ml  Net 1122.33 ml   Filed Weights   10/03/19 0524 10/06/19 0338 10/07/19 0408  Weight: 76.2 kg 73.3 kg 70.8 kg    Physical Exam:  General exam: awake, alert, no acute distress Respiratory system: CTAB, normal respiratory effort, no wheezes or rhonchi Cardiovascular system: normal S1/S2, RRR, no pedal edema.   Gastrointestinal system: soft, NT, ND, hypoactive bowel sounds. Central nervous system: A&O x4. no gross focal neurologic deficits, normal speech Extremities: moves all, no cyanosis, normal tone Psychiatry: normal mood, congruent affect, judgement and insight appear normal  Labs   Data Reviewed: I have personally reviewed following labs and imaging studies  CBC: Recent Labs  Lab 10/02/19 2145 10/03/19 0150 10/04/19 0530 10/04/19 0530 10/05/19 0012 10/05/19 0012 10/05/19 0630 10/05/19 1345 10/05/19 2148 10/06/19 0602 10/07/19 0531  WBC 14.0*   < > 11.1*  --  9.3  --  9.0  --   --  8.8 8.9  NEUTROABS 12.6*  --   --   --   --   --   --   --   --   --   --   HGB 8.9*   < > 9.3*   < > 7.9*   < > 7.8* 8.4* 8.2* 8.0* 8.5*  HCT 29.5*   < > 29.8*   < > 24.9*   < > 24.0* 25.7* 25.9* 24.5* 26.7*  MCV 74.9*   < > 71.3*  --  70.3*  --  68.4*  --   --  69.0* 69.9*  PLT 402*   < > 402*  --  316  --  341  --   --  341 353   < > = values in this interval not displayed.   Basic Metabolic Panel: Recent Labs  Lab 10/04/19 0530 10/04/19 1649 10/05/19 0630 10/05/19 1410 10/06/19 0602 10/06/19 0602 10/06/19 1403 10/06/19 2004 10/07/19 0531 10/07/19 1106 10/07/19 1336  NA 135  --  134*  --  136  --  133*  --  136  --   --   K 3.2*   < > 3.0*   < > 2.7*   < > 2.9* 3.1* 3.9 3.5 3.7  CL 110  --  108   --  112*  --  108  --  113*  --   --   CO2 15*  --  16*  --  17*  --  14*  --  13*  --   --   GLUCOSE 136*  --  88  --  73  --  76  --  76  --   --   BUN 27*  --  18  --  14  --  15  --  15  --   --   CREATININE 1.06*  --  0.72  --  0.65  --  0.62  --  0.65  --   --   CALCIUM 7.4*  --  7.3*  --  7.2*  --  7.4*  --  7.7*  --   --   MG 2.3  --   --   --  1.5*  --  2.6*  --  2.0 3.1*  --    < > = values in this interval not displayed.   GFR: Estimated Creatinine Clearance: 64.4 mL/min (by C-G formula based on SCr of 0.65 mg/dL). Liver Function Tests: Recent Labs  Lab 10/02/19 2145 10/05/19 0630  AST 78* 29  ALT 22 14  ALKPHOS 66 52  BILITOT 0.7 0.6  PROT 5.4* 4.7*  ALBUMIN 2.6* 2.1*   Recent Labs  Lab 10/02/19 2145  LIPASE 145*   No results for input(s): AMMONIA in the last 168 hours. Coagulation Profile: Recent Labs  Lab 10/03/19 0150  INR 1.2   Cardiac Enzymes: No results for input(s): CKTOTAL, CKMB, CKMBINDEX, TROPONINI in the last 168 hours. BNP (last 3 results) No results for input(s): PROBNP in the last 8760 hours. HbA1C: No results for input(s): HGBA1C in the last 72 hours. CBG: Recent Labs  Lab 10/04/19 2035  GLUCAP 89   Lipid Profile: No results for input(s): CHOL, HDL, LDLCALC, TRIG, CHOLHDL, LDLDIRECT in the last 72 hours. Thyroid Function Tests: No results for input(s): TSH, T4TOTAL, FREET4, T3FREE, THYROIDAB in the last 72 hours. Anemia Panel: No results for input(s): VITAMINB12, FOLATE, FERRITIN, TIBC, IRON, RETICCTPCT in the last 72 hours. Sepsis Labs: Recent Labs  Lab 10/03/19 0150 10/03/19 1046 10/04/19 0602 10/04/19 0848  LATICACIDVEN 4.4* 2.7* 1.6 1.6    Recent Results (from the past 240 hour(s))  SARS CORONAVIRUS 2 (TAT 6-24 HRS) Nasopharyngeal Nasopharyngeal Swab     Status: None   Collection Time: 09/28/19 10:37 AM   Specimen: Nasopharyngeal Swab  Result Value Ref Range Status   SARS Coronavirus 2 NEGATIVE NEGATIVE Final     Comment: (NOTE) SARS-CoV-2 target nucleic acids are NOT DETECTED. The SARS-CoV-2 RNA is generally detectable in upper and lower respiratory specimens during the acute phase of infection. Negative results do not preclude SARS-CoV-2 infection, do not rule out co-infections with other pathogens, and should not be used as the sole basis for treatment or other patient management decisions. Negative results must be combined with clinical observations, patient history, and epidemiological information. The expected result is Negative. Fact Sheet for Patients: SugarRoll.be Fact Sheet for Healthcare Providers: https://www.woods-mathews.com/ This test is not yet approved or cleared by the [place] FDA and  has been authorized for detection and/or diagnosis of SARS-CoV-2 by FDA under an Emergency Use Authorization (EUA). This EUA will remain  in effect (meaning this test can be used) for the duration of the COVID-19 declaration under Section 56 4(b)(1) of the Act, 21 U.S.C. section 360bbb-3(b)(1), unless the authorization is terminated or revoked sooner. Performed at [facility], [place] [address] [place], [place] [address]   Blood culture (routine x 2)     Status: None   Collection Time: 10/02/19  9:45 PM   Specimen: BLOOD  Result Value Ref Range Status   Specimen Description BLOOD LEFT ANTECUBITAL  Final   Special Requests   Final    BOTTLES DRAWN AEROBIC AND ANAEROBIC Blood Culture results may not be optimal due to an inadequate volume of blood received in culture bottles   Culture   Final    NO GROWTH 5 DAYS Performed at [facility], [address], [place], [place] [address]    Report Status 10/07/2019 FINAL  Final  Blood culture (routine x 2)     Status: None   Collection Time: 10/02/19  9:48 PM   Specimen: BLOOD  Result Value Ref Range Status   Specimen Description BLOOD BLOOD LEFT ARM  Final   Special Requests   Final     BOTTLES DRAWN AEROBIC AND ANAEROBIC Blood Culture adequate volume   Culture   Final    NO GROWTH 5 DAYS Performed at [facility], [address], [place], [place] [address]    Report Status 10/07/2019 FINAL  Final  SARS Coronavirus 2 by RT PCR (hospital order, performed in [facility] hospital lab) Nasopharyngeal Nasopharyngeal Swab     Status: None   Collection Time: 10/02/19 11:48 PM   Specimen: Nasopharyngeal Swab  Result Value Ref Range Status   SARS Coronavirus 2 NEGATIVE NEGATIVE Final    Comment: (NOTE) SARS-CoV-2 target nucleic acids are NOT DETECTED. The SARS-CoV-2 RNA is generally detectable in upper and lower respiratory specimens during the acute phase of infection. The lowest concentration of SARS-CoV-2 viral copies this assay can detect is 250 copies / mL. A negative result does not preclude SARS-CoV-2 infection and should not be used as the sole basis for treatment or other patient management decisions.  A negative result may occur with improper specimen collection / handling, submission of specimen other than nasopharyngeal swab, presence of viral mutation(s) within the areas targeted by this assay, and inadequate number of viral copies (<250 copies / mL). A negative result must be combined with clinical observations, patient history, and epidemiological information. Fact Sheet for Patients:   StrictlyIdeas.no Fact Sheet for Healthcare Providers: BankingDealers.co.za This test is not yet approved or cleared  by the [place] FDA and has been authorized for detection and/or diagnosis of SARS-CoV-2 by FDA under an Emergency Use Authorization (EUA).  This EUA will remain in effect (meaning this test can be used) for the duration of the COVID-19 declaration under Section 564(b)(1) of the Act, 21 U.S.C. section 360bbb-3(b)(1), unless the authorization is terminated or revoked sooner. Performed at [facility], [place], [place] [address]   C Difficile Quick Screen w PCR reflex     Status: None   Collection Time: 10/03/19  4:08 AM   Specimen: STOOL  Result Value Ref Range Status   C Diff antigen NEGATIVE NEGATIVE Final   C Diff toxin NEGATIVE NEGATIVE Final   C Diff interpretation No C. difficile detected.  Final    Comment: Performed at [facility], [address] [place], [place] [address]  Gastrointestinal Panel by PCR , Stool     Status: None   Collection Time: 10/03/19  4:09 AM   Specimen: STOOL  Result Value Ref Range Status   Campylobacter species NOT DETECTED NOT DETECTED Final   Plesimonas shigelloides NOT DETECTED NOT DETECTED Final   Salmonella species NOT DETECTED NOT DETECTED Final   Yersinia enterocolitica NOT DETECTED NOT DETECTED Final   Vibrio species NOT  DETECTED NOT DETECTED Final   Vibrio cholerae NOT DETECTED NOT DETECTED Final   Enteroaggregative E coli (EAEC) NOT DETECTED NOT DETECTED Final   Enteropathogenic E coli (EPEC) NOT DETECTED NOT DETECTED Final   Enterotoxigenic E coli (ETEC) NOT DETECTED NOT DETECTED Final   Shiga like toxin producing E coli (STEC) NOT DETECTED NOT DETECTED Final   Shigella/Enteroinvasive E coli (EIEC) NOT DETECTED NOT DETECTED Final   Cryptosporidium NOT DETECTED NOT DETECTED Final   Cyclospora cayetanensis NOT DETECTED NOT DETECTED Final   Entamoeba histolytica NOT DETECTED NOT DETECTED Final   Giardia lamblia NOT DETECTED NOT DETECTED Final   Adenovirus F40/41 NOT DETECTED NOT DETECTED Final   Astrovirus NOT DETECTED NOT DETECTED Final   Norovirus GI/GII NOT DETECTED NOT DETECTED Final   Rotavirus A NOT DETECTED NOT DETECTED Final   Sapovirus (I, II, IV, and V) NOT DETECTED NOT DETECTED Final    Comment: Performed at [facility], [address] [place], [place] [address]  C Difficile Quick Screen w PCR reflex     Status: None   Collection Time: 10/03/19  4:14 AM   Specimen: Stool   Result Value Ref Range Status   C Diff antigen NEGATIVE NEGATIVE Final   C Diff toxin NEGATIVE NEGATIVE Final   C Diff interpretation No C. difficile detected.  Final    Comment: Performed at [facility], [place], [place] [address]      Imaging Studies   DG Abd 1 View  Result Date: 10/06/2019 CLINICAL DATA:  Ileus EXAM: ABDOMEN - 1 VIEW COMPARISON:  10/04/2019 FINDINGS: Gaseous distension of small and large bowel without obstructive pattern or over distention. The colonic gaseous distension is is greater than before. The enteric tube tip is at the mid stomach. Defibrillator pad over the left chest. IMPRESSION: Nonobstructive bowel gas pattern with moderate small bowel and colonic gas. Electronically Signed   By: Monte Fantasia M.D.   On: 10/06/2019 11:07   [place] OR NERVE BLOCK-IMAGE ONLY [facility])  Result Date: 10/07/2019 There is no interpretation for this exam.  This order is for images obtained during a surgical procedure.  Please See "Surgeries" Tab for more information regarding the procedure.   ECHOCARDIOGRAM COMPLETE  Result Date: 10/06/2019    ECHOCARDIOGRAM REPORT   Patient Name:   Sydney Evans Date of Exam: 10/06/2019 Medical Rec #:  [id number]       Height:       63.0 in Accession #:    [id number]      Weight:       161.6 lb Date of Birth:  1950/09/20       BSA:          1.766 m Patient Age:    61 years        BP:           168/74 mmHg Patient Gender: F               HR:           63 bpm. Exam Location:  [facility] Procedure: 2D Echo, Color Doppler and Cardiac Doppler Indications:     Preoperative evaluation  History:         Patient has prior history of Echocardiogram examinations. CAD,                  TIA; Risk Factors:Hypertension and Dyslipidemia.  Sonographer:     Charmayne Sheer RDCS (AE) Referring Phys:  IN:[id number] Kyra Searles  CHATTERJEE Diagnosing Phys: Kate Sable MD  Sonographer Comments: Suboptimal parasternal window. TDS due to inability to move pt due to  dislocated rt shoulder and Image acquisition challenging due to respiratory motion. IMPRESSIONS  1. Left ventricular ejection fraction, by estimation, is 60 to 65%. The left ventricle has normal function. The left ventricle has no regional wall motion abnormalities. Left ventricular diastolic parameters are consistent with Grade I diastolic dysfunction (impaired relaxation).  2. Right ventricular systolic function is normal. The right ventricular size is normal.  3. The mitral valve is normal in structure. No evidence of mitral valve regurgitation. No evidence of mitral stenosis.  4. The aortic valve is grossly normal. Aortic valve regurgitation is not visualized. No aortic stenosis is present.  5. The inferior vena cava is normal in size with greater than 50% respiratory variability, suggesting right atrial pressure of 3 mmHg. FINDINGS  Left Ventricle: Left ventricular ejection fraction, by estimation, is 60 to 65%. The left ventricle has normal function. The left ventricle has no regional wall motion abnormalities. The left ventricular internal cavity size was normal in size. There is  no left ventricular hypertrophy. Left ventricular diastolic parameters are consistent with Grade I diastolic dysfunction (impaired relaxation). Right Ventricle: The right ventricular size is normal. No increase in right ventricular wall thickness. Right ventricular systolic function is normal. Left Atrium: Left atrial size was normal in size. Right Atrium: Right atrial size was normal in size. Pericardium: There is no evidence of pericardial effusion. Mitral Valve: The mitral valve is normal in structure. Normal mobility of the mitral valve leaflets. No evidence of mitral valve regurgitation. No evidence of mitral valve stenosis. MV peak gradient, 6.1 mmHg. The mean mitral valve gradient is 2.0 mmHg. Tricuspid Valve: The tricuspid valve is normal in structure. Tricuspid valve regurgitation is not demonstrated. No evidence of  tricuspid stenosis. Aortic Valve: The aortic valve is grossly normal. Aortic valve regurgitation is not visualized. No aortic stenosis is present. Aortic valve mean gradient measures 7.0 mmHg. Aortic valve peak gradient measures 16.6 mmHg. Aortic valve area, by VTI measures  2.98 cm. Pulmonic Valve: The pulmonic valve was not well visualized. Pulmonic valve regurgitation is not visualized. No evidence of pulmonic stenosis. Aorta: The aortic root is normal in size and structure. Venous: The inferior vena cava is normal in size with greater than 50% respiratory variability, suggesting right atrial pressure of 3 mmHg. IAS/Shunts: No atrial level shunt detected by color flow Doppler.  LEFT VENTRICLE PLAX 2D LVIDd:         4.03 cm     Diastology LVIDs:         2.47 cm     LV e' lateral:   7.72 cm/s LV PW:         1.04 cm     LV E/e' lateral: 8.6 LV IVS:        0.84 cm     LV e' medial:    5.66 cm/s LVOT diam:     2.20 cm     LV E/e' medial:  11.7 LV SV:         119 LV SV Index:   67 LVOT Area:     3.80 cm  LV Volumes (MOD) LV vol d, MOD A4C: 71.8 ml LV vol s, MOD A4C: 28.5 ml LV SV MOD A4C:     71.8 ml RIGHT VENTRICLE RV Basal diam:  2.99 cm LEFT ATRIUM  Index       RIGHT ATRIUM           Index LA diam:        3.70 cm 2.09 cm/m  RA Area:     10.50 cm LA Vol (A2C):   73.5 ml 41.62 ml/m RA Volume:   20.30 ml  11.49 ml/m LA Vol (A4C):   40.6 ml 22.99 ml/m LA Biplane Vol: 55.9 ml 31.65 ml/m  AORTIC VALVE AV Area (Vmax):    2.72 cm AV Area (Vmean):   2.86 cm AV Area (VTI):     2.98 cm AV Vmax:           204.00 cm/s AV Vmean:          119.000 cm/s AV VTI:            0.398 m AV Peak Grad:      16.6 mmHg AV Mean Grad:      7.0 mmHg LVOT Vmax:         146.00 cm/s LVOT Vmean:        89.600 cm/s LVOT VTI:          0.312 m LVOT/AV VTI ratio: 0.78  AORTA Ao Root diam: 3.00 cm MITRAL VALVE MV Area (PHT): 2.66 cm     SHUNTS MV Peak grad:  6.1 mmHg     Systemic VTI:  0.31 m MV Mean grad:  2.0 mmHg     Systemic  Diam: 2.20 cm MV Vmax:       1.23 m/s MV Vmean:      70.8 cm/s MV Decel Time: 285 msec MV E velocity: 66.50 cm/s MV A velocity: 112.00 cm/s MV E/A ratio:  0.59 Kate Sable MD Electronically signed by Kate Sable MD Signature Date/Time: 10/06/2019/1:44:31 PM    Final      Medications   Scheduled Meds: . Chlorhexidine Gluconate Cloth  6 each Topical Daily  . citalopram  40 mg Oral Daily  . lamoTRIgine  200 mg Oral Daily  . latanoprost  1 drop Both Eyes QHS  . pantoprazole (PROTONIX) IV  40 mg Intravenous Q12H   Continuous Infusions: . 0.9 % NaCl with KCl 40 mEq / L 100 mL/hr at 10/07/19 1200  .  ceFAZolin (ANCEF) IV    . potassium chloride 10 mEq (10/07/19 1335)       LOS: 4 days    Time spent: 25 minutes    Ezekiel Slocumb, DO [facility]  10/07/2019, 2:08 PM    If 7PM-7AM, please contact night-coverage. How to contact the [facility] Attending or Consulting provider [place] or covering provider during after hours [place], for this patient?    1. Check the care team in [facility] and look for a) attending/consulting [facility] provider listed and b) the [facility] team listed 2. Log into [email or web address] and use [place]'s universal password to access. If you do not have the password, please contact the hospital operator. 3. Locate the [facility] provider you are looking for under [facility] and page to a number that you can be directly reached. 4. If you still have difficulty reaching the provider, please page the [facility] (Director on Call) for the Hospitalists listed on amion for assistance.

## 2019-10-07 NOTE — Progress Notes (Signed)
Patient returned to room from surgical procedure; received report from RN. Patient is Alert and oriented; visitor at bedside. Right arm immobilized in sling, elevated on pillow, and ice pack to site.; denies pain. NS infusing to gravity; stopped. Will place ordered IVF. Instructed for NPO except meds and sips water. Orders reconciled. Assessment completed.

## 2019-10-07 NOTE — Anesthesia Procedure Notes (Signed)
Procedure Name: Intubation Performed by: Kathi Dohn, CRNA Pre-anesthesia Checklist: Patient identified, Patient being monitored, Timeout performed, Emergency Drugs available and Suction available Patient Re-evaluated:Patient Re-evaluated prior to induction Oxygen Delivery Method: Circle system utilized Preoxygenation: Pre-oxygenation with 100% oxygen Induction Type: IV induction and Rapid sequence Laryngoscope Size: 3 and McGraph Grade View: Grade I Tube type: Oral Tube size: 7.0 mm Number of attempts: 1 Airway Equipment and Method: Stylet and Video-laryngoscopy Placement Confirmation: ETT inserted through vocal cords under direct vision,  positive ETCO2 and breath sounds checked- equal and bilateral Secured at: 21 cm Tube secured with: Tape Dental Injury: Teeth and Oropharynx as per pre-operative assessment        

## 2019-10-07 NOTE — Progress Notes (Signed)
Progress Note  Patient Name: Sydney Evans Date of Encounter: 10/07/2019  Primary Cardiologist: New to [facility] - consult by Agbor-Etang  Subjective   No chest pain or dyspnea. No ventricular ectopy noted on telemetry. Potassium repleted to 3.9 this morning. Magnesium 2.0.   Inpatient Medications    Scheduled Meds: . Chlorhexidine Gluconate Cloth  6 each Topical Daily  . citalopram  40 mg Oral Daily  . lamoTRIgine  200 mg Oral Daily  . latanoprost  1 drop Both Eyes QHS  . pantoprazole (PROTONIX) IV  40 mg Intravenous Q12H   Continuous Infusions: . 0.9 % NaCl with KCl 40 mEq / L 100 mL/hr (10/07/19 0310)  .  ceFAZolin (ANCEF) IV    . magnesium sulfate bolus IVPB    . potassium chloride    . vancomycin 750 mg (10/07/19 0045)   PRN Meds: diphenhydrAMINE, fentaNYL (SUBLIMAZE) injection, fluticasone, hydrALAZINE, magnesium hydroxide, ondansetron **OR** ondansetron (ZOFRAN) IV, polyvinyl alcohol, tiZANidine, traZODone   Vital Signs    Vitals:   10/06/19 2336 10/07/19 0408 10/07/19 0500 10/07/19 0700  BP: (!) 150/66 (!) 159/82    Pulse:  77    Resp:  19 15 16   Temp:  97.9 F (36.6 C)    TempSrc:  Oral    SpO2:  100%    Weight:  70.8 kg    Height:        Intake/Output Summary (Last 24 hours) at 10/07/2019 0739 Last data filed at 10/07/2019 0700 Gross per 24 hour  Intake 3236.33 ml  Output 4585 ml  Net -1348.67 ml   Filed Weights   10/03/19 0524 10/06/19 0338 10/07/19 0408  Weight: 76.2 kg 73.3 kg 70.8 kg    Telemetry    SR without ectopy - Personally Reviewed  ECG    No new tracings - Personally Reviewed  Physical Exam   GEN: No acute distress.   Neck: No JVD. Cardiac: RRR, no murmurs, rubs, or gallops.  Respiratory: Clear to auscultation bilaterally.  GI: Soft, nontender, non-distended.   MS: No edema; No deformity. Neuro:  Alert and oriented x 3; Nonfocal.  Psych: Normal affect.  Labs    Chemistry Recent Labs  Lab 10/02/19 2145 10/03/19 0150  10/05/19 0630 10/05/19 1410 10/06/19 0602 10/06/19 0602 10/06/19 1403 10/06/19 2004 10/07/19 0531  NA 131*   < > 134*  --  136  --  133*  --  136  K 2.2*   < > 3.0*   < > 2.7*   < > 2.9* 3.1* 3.9  CL 106   < > 108  --  112*  --  108  --  113*  CO2 14*   < > 16*  --  17*  --  14*  --  13*  GLUCOSE 167*   < > 88  --  73  --  76  --  76  BUN 15   < > 18  --  14  --  15  --  15  CREATININE 1.37*   < > 0.72  --  0.65  --  0.62  --  0.65  CALCIUM 7.1*   < > 7.3*  --  7.2*  --  7.4*  --  7.7*  PROT 5.4*  --  4.7*  --   --   --   --   --   --   ALBUMIN 2.6*  --  2.1*  --   --   --   --   --   --  AST 78*  --  29  --   --   --   --   --   --   ALT 22  --  14  --   --   --   --   --   --   ALKPHOS 66  --  52  --   --   --   --   --   --   BILITOT 0.7  --  0.6  --   --   --   --   --   --   GFRNONAA 40*   < > >60  --  >60  --  >60  --  >60  GFRAA 46*   < > >60  --  >60  --  >60  --  >60  ANIONGAP 11   < > 10  --  7  --  11  --  10   < > = values in this interval not displayed.     Hematology Recent Labs  Lab 10/05/19 0630 10/05/19 1345 10/05/19 2148 10/06/19 0602 10/07/19 0531  WBC 9.0  --   --  8.8 8.9  RBC 3.51*  --   --  3.55* 3.82*  HGB 7.8*   < > 8.2* 8.0* 8.5*  HCT 24.0*   < > 25.9* 24.5* 26.7*  MCV 68.4*  --   --  69.0* 69.9*  MCH 22.2*  --   --  22.5* 22.3*  MCHC 32.5  --   --  32.7 31.8  RDW 17.7*  --   --  17.5* 17.9*  PLT 341  --   --  341 353   < > = values in this interval not displayed.    Cardiac EnzymesNo results for input(s): TROPONINI in the last 168 hours. No results for input(s): TROPIPOC in the last 168 hours.   BNPNo results for input(s): BNP, PROBNP in the last 168 hours.   DDimer No results for input(s): DDIMER in the last 168 hours.   Radiology    DG Abd 1 View  Result Date: 10/06/2019 IMPRESSION: Nonobstructive bowel gas pattern with moderate small bowel and colonic gas. Electronically Signed   By: Monte Fantasia M.D.   On: 10/06/2019 11:07    Cardiac Studies   2D echo 10/06/2019: 1. Left ventricular ejection fraction, by estimation, is 60 to 65%. The  left ventricle has normal function. The left ventricle has no regional  wall motion abnormalities. Left ventricular diastolic parameters are  consistent with Grade I diastolic  dysfunction (impaired relaxation).  2. Right ventricular systolic function is normal. The right ventricular  size is normal.  3. The mitral valve is normal in structure. No evidence of mitral valve  regurgitation. No evidence of mitral stenosis.  4. The aortic valve is grossly normal. Aortic valve regurgitation is not  visualized. No aortic stenosis is present.  5. The inferior vena cava is normal in size with greater than 50%  respiratory variability, suggesting right atrial pressure of 3 mmHg.  Patient Profile     69 y.o. female with history of HTN, HLD, TIA, carotid artery stenosis, and vertigo who we are seeing for preoperative evaluation and NSVT.  Assessment & Plan    1. Preoperative cardiac evaluation: -She is without symptoms concerning for angina or decompensation  -Echo showed preserved LVSF -Revised Cardiac Index: low risk -Duke Activity Status Index: > 4 METs -She may proceed with noncardiac surgery without any further cardiac testing  2.  NSVT: -No strips available for review -Likely in the setting of electrolyte abnormalities  -Recommend maintaining potassium goal of 4.0 and magnesium goal of 2.0 in the perioperative time frame   3. Carotid artery disease: -ASA and statin when able  For questions or updates, please contact [place] Please consult [email or web address] for contact info under Cardiology/STEMI.    Signed, Christell Faith, PA-C [place] Pager: [phone] 10/07/2019, 7:39 AM

## 2019-10-07 NOTE — Progress Notes (Signed)
[facility] SURGICAL PROGRESS NOTE (cpt [phone])  Hospital Day(s): 4.   Post op day(s): Day of Surgery.   Interval History: Patient seen and examined, no acute events or new complaints overnight. Patient reports she is feeling better since the NGT was removed yesterday. No abdominal pain, distension, nausea, or emesis. K+ improved to 3.9 and otherwise electrolyte derangements have resolved. She is having bowel function.   Review of Systems:  Constitutional: denies fever, chills  HEENT: denies cough or congestion  Respiratory: denies any shortness of breath  Cardiovascular: denies chest pain or palpitations  Gastrointestinal: denies abdominal pain, N/V, or diarrhea/and bowel function as per interval history Genitourinary: denies burning with urination or urinary frequency Musculoskeletal: denies pain, decreased motor or sensation   Vital signs in last 24 hours: [min-max] current  Temp:  [97.9 F (36.6 C)-98.7 F (37.1 C)] 97.9 F (36.6 C) (06/03 0408) Pulse Rate:  [63-77] 77 (06/03 0408) Resp:  [15-19] 16 (06/03 0700) BP: (150-177)/(66-82) 159/82 (06/03 0408) SpO2:  [99 %-100 %] 100 % (06/03 0408) Weight:  [70.8 kg] 70.8 kg (06/03 0408)     Height: 5\' 3"  (160 cm) Weight: 70.8 kg BMI (Calculated): 27.64   Intake/Output last 2 shifts:  06/02 0701 - 06/03 0700 In: 3236.3 [I.V.:1226.3; NG/GT:60; IV Piggyback:550] Out: [id number] [Urine:3000; Emesis/NG output:1585]   Physical Exam:  Constitutional: alert, cooperative and no distress  HENT: normocephalic without obvious abnormality Eyes: PERRL, EOM's grossly intact and symmetric  Respiratory: breathing non-labored at rest  Cardiovascular: regular rate and sinus rhythm  Gastrointestinal: soft, non-tender, and non-distended, rectal tube in place Genitourinary: Foley in place Musculoskeletal: no edema or wounds, motor and sensation grossly intact, NT    Labs:  CBC Latest Ref Rng & Units 10/07/2019 10/06/2019 10/05/2019  WBC  4.0 - 10.5 K/uL 8.9 8.8 -  Hemoglobin 12.0 - 15.0 g/dL 8.5(L) 8.0(L) 8.2(L)  Hematocrit 36.0 - 46.0 % 26.7(L) 24.5(L) 25.9(L)  Platelets 150 - 400 K/uL 353 341 -   CMP Latest Ref Rng & Units 10/07/2019 10/06/2019 10/06/2019  Glucose 70 - 99 mg/dL 76 - 76  BUN 8 - 23 mg/dL 15 - 15  Creatinine 0.44 - 1.00 mg/dL 0.65 - 0.62  Sodium 135 - 145 mmol/L 136 - 133(L)  Potassium 3.5 - 5.1 mmol/L 3.9 3.1(L) 2.9(L)  Chloride 98 - 111 mmol/L 113(H) - 108  CO2 22 - 32 mmol/L 13(L) - 14(L)  Calcium 8.9 - 10.3 mg/dL 7.7(L) - 7.4(L)  Total Protein 6.5 - 8.1 g/dL - - -  Total Bilirubin 0.3 - 1.2 mg/dL - - -  Alkaline Phos 38 - 126 U/L - - -  AST 15 - 41 U/L - - -  ALT 0 - 44 U/L - - -    Imaging studies: No new pertinent imaging studies   Assessment/Plan: (ICD-10's: K69.7) 69 y.o. female with resolved hypokalemia who we are following for post-operative ileus Day of Surgery for planned right shoulder ORIF   - NPO for orthopedic procedure  - From surgical standpoint, okay to resume diet as tolerated post-operatively   - Continue IVF Resuscitation    - Monitor abdominal examination; on-going bowel function              - Pain control prn; antiemetics prn    - Nothing further from general surgery standpoint, we will sign off, please call with questions or concerns   All of the above findings and recommendations were discussed with the patient, and the medical team, and  all of patient's questions were answered to her expressed satisfaction.  -- Edison Simon, PA-C [facility] 10/07/2019, 7:37 AM [phone] M-F: 7am - 4pm

## 2019-10-08 ENCOUNTER — Inpatient Hospital Stay: Payer: Medicare Other

## 2019-10-08 DIAGNOSIS — I472 Ventricular tachycardia: Secondary | ICD-10-CM

## 2019-10-08 DIAGNOSIS — S46011A Strain of muscle(s) and tendon(s) of the rotator cuff of right shoulder, initial encounter: Secondary | ICD-10-CM | POA: Insufficient documentation

## 2019-10-08 HISTORY — DX: Strain of muscle(s) and tendon(s) of the rotator cuff of right shoulder, initial encounter: S46.011A

## 2019-10-08 LAB — BASIC METABOLIC PANEL
Anion gap: 7 (ref 5–15)
BUN: 14 mg/dL (ref 8–23)
CO2: 15 mmol/L — ABNORMAL LOW (ref 22–32)
Calcium: 7.6 mg/dL — ABNORMAL LOW (ref 8.9–10.3)
Chloride: 113 mmol/L — ABNORMAL HIGH (ref 98–111)
Creatinine, Ser: 0.68 mg/dL (ref 0.44–1.00)
GFR calc Af Amer: >60 mL/min (ref >60)
GFR calc non Af Amer: >60 mL/min (ref >60)
Glucose, Bld: 134 mg/dL — ABNORMAL HIGH (ref 70–99)
Potassium: 4.9 mmol/L (ref 3.5–5.1)
Sodium: 135 mmol/L (ref 135–145)

## 2019-10-08 LAB — CBC
HCT: 26 % — ABNORMAL LOW (ref 36.0–46.0)
Hemoglobin: 8 g/dL — ABNORMAL LOW (ref 12.0–15.0)
MCH: 22.2 pg — ABNORMAL LOW (ref 26.0–34.0)
MCHC: 30.8 g/dL (ref 30.0–36.0)
MCV: 72.2 fL — ABNORMAL LOW (ref 80.0–100.0)
Platelets: 359 10*3/uL (ref 150–400)
RBC: 3.6 MIL/uL — ABNORMAL LOW (ref 3.87–5.11)
RDW: 17.9 % — ABNORMAL HIGH (ref 11.5–15.5)
WBC: 9.4 10*3/uL (ref 4.0–10.5)
nRBC: 0.2 % (ref 0.0–0.2)

## 2019-10-08 LAB — MAGNESIUM: Magnesium: 2.1 mg/dL (ref 1.7–2.4)

## 2019-10-08 MED ORDER — SIMETHICONE 80 MG PO CHEW
80.0000 mg | CHEWABLE_TABLET | Freq: Four times a day (QID) | ORAL | Status: DC | PRN
  Administered 2019-10-08: 80 mg via ORAL
  Filled 2019-10-08 (×3): qty 1

## 2019-10-08 MED ORDER — METOPROLOL TARTRATE 25 MG PO TABS
12.5000 mg | ORAL_TABLET | Freq: Two times a day (BID) | ORAL | Status: DC
  Administered 2019-10-08 – 2019-10-11 (×6): 12.5 mg via ORAL
  Filled 2019-10-08 (×6): qty 1

## 2019-10-08 MED ORDER — FENTANYL CITRATE (PF) 100 MCG/2ML IJ SOLN
25.0000 ug | INTRAMUSCULAR | Status: DC | PRN
  Administered 2019-10-08 – 2019-10-11 (×5): 25 ug via INTRAVENOUS
  Filled 2019-10-08 (×5): qty 2

## 2019-10-08 MED ORDER — METOPROLOL TARTRATE 25 MG PO TABS: 25.0000 mg | ORAL_TABLET | Freq: Two times a day (BID) | ORAL | Status: DC

## 2019-10-08 NOTE — Care Management Important Message (Signed)
Important Message  Patient Details  Name: Sydney Evans MRN: [id number] Date of Birth: 1950-05-26   Medicare Important Message Given:  Yes     Dannette Barbara 10/08/2019, 2:43 PM

## 2019-10-08 NOTE — Evaluation (Signed)
Physical Therapy Evaluation Patient Details Name: Sydney Evans MRN: [id number] DOB: Sep 27, 1950 Today's Date: 10/08/2019   History of Present Illness  presented to ER secondary to hypotension, abdominal pain, nausea/vomiting, passed out x1 at home; admitted for management of acute ileus (managed with NPO, NGT) and noted anterior dislocation of R shoulder (s/p surgical relocation and repair of subscapularis disruption 6/3).  Of note, initial R TSR 09/30/19.  Clinical Impression  Patient awake and alert upon arrival to room; eager for OOB activities as tolerated.  R UE immobilized in sling; good movement or R fingers, wrist and hand with pain well-controlled.  L UE, bilat LEs grossly WFL and symmetrical; appropriate for basic transfers and mobility.  Able to complete bed mobility (towards L) with mod indep; sit/stand, basic transfers and gait (180') without assist device, cga/min assist.  Demonstrates reciprocal stepping pattern, mildly antalgic; mild sway/deviation with head turns and dynamic gait components.  Mod SOB with exertion, sats >95% on RA.  Will plan to trial [facility] next session (too fatigued for repeat trial this session). Would benefit from skilled PT to address above deficits and promote optimal return to PLOF.; Recommend transition to HHPT upon discharge from acute hospitalization.     Follow Up Recommendations Home health PT    Equipment Recommendations       Recommendations for Other Services       Precautions / Restrictions Precautions Precautions: Shoulder;Fall Shoulder Interventions: Shoulder sling/immobilizer;Off for dressing/bathing/exercises Required Braces or Orthoses: Sling Restrictions Weight Bearing Restrictions: Yes RUE Weight Bearing: Non weight bearing      Mobility  Bed Mobility Overal bed mobility: Modified Independent             General bed mobility comments: towards L side of bed  Transfers Overall transfer level: Needs assistance Equipment  used: None Transfers: Sit to/from Stand Sit to Stand: Min guard         General transfer comment: fair/good LE strength and power with movement transition; broad BOS  Ambulation/Gait Ambulation/Gait assistance: Min guard;Min assist Gait Distance (Feet): 180 Feet Assistive device: None       General Gait Details: reciprocal stepping pattern, mildly antalgic; mild sway/deviation with head turns and dynamic gait components.  Mod SOB with exertion, sats >95% on RA.  Will plan to trial [facility] next session (too fatigued for repeat trial this session).  Stairs            Wheelchair Mobility    Modified Rankin (Stroke Patients Only)       Balance Overall balance assessment: Needs assistance Sitting-balance support: No upper extremity supported;Feet supported Sitting balance-Leahy Scale: Good     Standing balance support: No upper extremity supported Standing balance-Leahy Scale: Fair                               Pertinent Vitals/Pain Pain Assessment: Faces Faces Pain Scale: Hurts little more Pain Location: L lower abdomen Pain Descriptors / Indicators: Aching Pain Intervention(s): Limited activity within patient's tolerance;Monitored during session;Repositioned    Home Living Family/patient expects to be discharged to:: Private residence Living Arrangements: Alone Available Help at Discharge: Family(niece lives close and is very supportive) Type of Home: Apartment Home Access: Level entry     Home Layout: One level        Prior Function Level of Independence: Independent with assistive device(s)         Comments: Per PT, pt reports that she uses 4WW much  of the time, but will use cane or no AD depending on the day and how she feels. Pt still drives, but does not have a car (niece transports her). Enjoys puzzles.     Hand Dominance   Dominant Hand: Right    Extremity/Trunk Assessment   Upper Extremity Assessment Upper Extremity  Assessment: (R UE immobilized in sling, able to move fingers, hand and wrist appropriately.  L UE grossly WFL)    Lower Extremity Assessment Lower Extremity Assessment: Overall WFL for tasks assessed(grossly at least 4-/5 throughout)       Communication   Communication: No difficulties  Cognition Arousal/Alertness: Awake/alert Behavior During Therapy: WFL for tasks assessed/performed Overall Cognitive Status: Within Functional Limits for tasks assessed                                        General Comments      Exercises Other Exercises Other Exercises: Orthostatic assessment-see vitals flowsheet for details; stable and WFL, no reports of dizziness/lightheadedness. Other Exercises: Reviewed role of PT and progressive mobiltiy in acute setting; encouraged OOB to chair, short-distance gait with staff as appropriate to promote balance/endurance recovery.  Patient voiced understanding.   Assessment/Plan    PT Assessment Patient needs continued PT services  PT Problem List Decreased range of motion;Decreased strength;Decreased balance;Decreased activity tolerance;Decreased mobility;Decreased coordination;Decreased knowledge of use of DME;Decreased safety awareness;Decreased knowledge of precautions;Pain       PT Treatment Interventions DME instruction;Gait training;Stair training;Functional mobility training;Therapeutic activities;Therapeutic exercise;Balance training;Neuromuscular re-education;Patient/family education    PT Goals (Current goals can be found in the Care Plan section)  Acute Rehab PT Goals Patient Stated Goal: to return home PT Goal Formulation: With patient Time For Goal Achievement: 10/14/19 Potential to Achieve Goals: Good    Frequency 7X/week   Barriers to discharge        Co-evaluation               AM-PAC PT "6 Clicks" Mobility  Outcome Measure Help needed turning from your back to your side while in a flat bed without using  bedrails?: None Help needed moving from lying on your back to sitting on the side of a flat bed without using bedrails?: None Help needed moving to and from a bed to a chair (including a wheelchair)?: None Help needed standing up from a chair using your arms (e.g., wheelchair or bedside chair)?: A Little Help needed to walk in hospital room?: A Little Help needed climbing 3-5 steps with a railing? : A Little 6 Click Score: 21    End of Session Equipment Utilized During Treatment: Gait belt Activity Tolerance: Patient tolerated treatment well Patient left: in chair;with call bell/phone within reach;with nursing/sitter in room   PT Visit Diagnosis: Muscle weakness (generalized) (M62.81);Difficulty in walking, not elsewhere classified (R26.2);Pain Pain - Right/Left: Right Pain - part of body: Shoulder    Time: 2951-8841 PT Time Calculation (min) (ACUTE ONLY): 30 min   Charges:   PT Evaluation $PT Eval Moderate Complexity: 1 Mod PT Treatments $Therapeutic Activity: 8-22 mins        Choya Tornow H. Owens Shark, PT, DPT, NCS 10/08/19, 9:23 AM [phone]

## 2019-10-08 NOTE — Progress Notes (Signed)
Foley catheter and rectal tube removed per MD order. Pt tolerated well. Will continue to monitor.

## 2019-10-08 NOTE — Progress Notes (Signed)
PROGRESS NOTE    Johnni Wunschel   PXT:[id number]  DOB: October 19, 1950  PCP: Patient, No Pcp Per    DOA: 10/02/2019 LOS: 5   Brief Narrative   Evalina Tabak is a 69 y.o. female admitted overnight to the hospitalist team with diffuse ileus.  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 Roland Rack.  Patient reports that since she got home, she started having abdominal distention, which progressed into nausea and vomiting.  In the ED, labs showed AKI with a creatinine of 1.37 from prior of 0.63, severe hypokalemia with potassium of 2.2, lactic acidosis of 4.4, and WBC of 14.  CT abdomen/pelvis showed generalized ileus with no transition point or evidence of bowel obstruction. The patient was empirically given IV cefepime and vancomycin and admitted for further management.  General surgery, GI, and orthopedics are following.     Assessment & Plan   Principal Problem:   Ileus ([place]) Active Problems:   NSVT (nonsustained ventricular tachycardia) (HCC)   Dislocation of right shoulder joint   Hypokalemia   Hypomagnesemia   Pre-op evaluation  Recurrent Left Lower Quadrant Abdominal Pain - onset this afternoon today after lunch, normal diet.  Stat abdominal xray.  Surgery re-consulted.  IV Fentanyl PRN.  Revert diet to full liquids.  Trial of simethicone.  Ileus - POA.  Resolved.  In setting of shoulder surgery 5/27.  General surgery was consulted.  NG tube removed on 6/2.  Full liquid diet for now given pain recurrence.  As needed pain meds and antiemetics. Empiric antibiotics on admission were stopped and no signs of infection.   Right shoulder dislocation - POD 1 from [facility] repair and joint reduction with Dr. Roland Rack.  Patient had right shoulder arthroplasty on 5/27.  Patient had a fall at home after discharge resulting in dislocation.   Fentanyl as needed for pain control.  Hypertension -likely exacerbated due to pain.  History of chronic hypertension for which she takes  lisinopril 40 mg daily.  Hold lisinopril.   IV hydralazine as needed.  NSVT (nonsustained ventricular tachycardia) -patient had 11 beat run of V. tach on telemetry in setting of hypokalemia.  Continue telemetry monitoring.  Replace potassium and magnesium, target K>4.0, Mg>2.0.  Cardiology consulted.  Metoprolol low dose started.  Hypokalemia -persistent, acute on chronic.  Patient has long history of hypokalemia which she manages at home by drinking Gatorade.  Have been aggressively replacing throughout admission.  Daily BMP's.  Hypomagnesemia -replacing with IV.  Monitor daily and replace for goal Mg>2.0.  Bradycardia -resolved.  Monitor.  Bipolar disorder -no acute issues.  Continue Lamictal.  GERD -continue PPI.  DVT prophylaxis: SCD's  Diet:  Diet Orders (From admission, onward)    Start     Ordered   10/08/19 0741  Diet full liquid Room service appropriate? Yes; Fluid consistency: Thin  Diet effective now    Question Answer Comment  Room service appropriate? Yes   Fluid consistency: Thin      10/08/19 0740            Code Status: DNR    Subjective 10/08/19    Patient seen up in chair this morning.  No acute events reported.  She reported feeling well.  Pain controlled as nerve block not yet worn off.  Wants rectal tube removed and bedside commode available.  Okay with Foley out and Purewick.  This afternoon, patient had sudden onset of LLQ abdominal pain, severe and sharp but intermittent.  Onset after  diet was advanced for lunch.  Niece says this is what she looked like when she brought her in.  No nausea/vomiting.    Disposition Plan & Communication   Status is: Inpatient  Remains inpatient appropriate because:Inpatient level of care appropriate due to severity of illness   Dispo: The patient is from: Home              Anticipated d/c is to: Home              Anticipated d/c date is: 3 days              Patient currently is not medically stable to  d/c.   Family Communication: none at bedside during encounter, will attempt to call   Consults, Procedures, Significant Events   Consultants:   Orthopedics  Cardiology  General surgery  Procedures:   None  Antimicrobials:   Vancomycin 5/29 >> 6/1  Cefepime 5/29 >> 6/2  Flagyl 5/30 >> 6/2   Objective   Vitals:   10/07/19 2315 10/08/19 0017 10/08/19 0505 10/08/19 0737  BP: 111/62 (!) 141/71 (!) 161/80 (!) 160/70  Pulse:  70 81 75  Resp: 20 19 19 16   Temp:  98.4 F (36.9 C) 98.5 F (36.9 C) 98 F (36.7 C)  TempSrc:  Oral Oral Oral  SpO2:  97% 98% 96%  Weight:   72.9 kg   Height:        Intake/Output Summary (Last 24 hours) at 10/08/2019 0800 Last data filed at 10/08/2019 0511 Gross per 24 hour  Intake 1971 ml  Output 1675 ml  Net 296 ml   Filed Weights   10/06/19 0338 10/07/19 0408 10/08/19 0505  Weight: 73.3 kg 70.8 kg 72.9 kg    Physical Exam:  General exam: awake, alert, no acute distress Respiratory system: CTAB, normal respiratory effort, no wheezes or rhonchi Cardiovascular system: normal S1/S2, RRR, no pedal edema.   Gastrointestinal system: LLQ tenderness, soft, NT, ND, +bowel sounds. Central nervous system: A&O x4. no gross focal neurologic deficits, normal speech Extremities: RUE in immobilizing sling, no cyanosis, normal tone Psychiatry: normal mood, congruent affect, judgement and insight appear normal  Labs   Data Reviewed: I have personally reviewed following labs and imaging studies  CBC: Recent Labs  Lab 10/02/19 2145 10/03/19 0150 10/05/19 0012 10/05/19 0012 10/05/19 0630 10/05/19 0630 10/05/19 1345 10/05/19 2148 10/06/19 0602 10/07/19 0531 10/08/19 0410  WBC 14.0*   < > 9.3  --  9.0  --   --   --  8.8 8.9 9.4  NEUTROABS 12.6*  --   --   --   --   --   --   --   --   --   --   HGB 8.9*   < > 7.9*   < > 7.8*   < > 8.4* 8.2* 8.0* 8.5* 8.0*  HCT 29.5*   < > 24.9*   < > 24.0*   < > 25.7* 25.9* 24.5* 26.7* 26.0*  MCV 74.9*    < > 70.3*  --  68.4*  --   --   --  69.0* 69.9* 72.2*  PLT 402*   < > 316  --  341  --   --   --  341 353 359   < > = values in this interval not displayed.   Basic Metabolic Panel: Recent Labs  Lab 10/05/19 0630 10/05/19 1410 10/06/19 0602 10/06/19 0602 10/06/19 1403 10/06/19 1403 10/06/19 2004 10/07/19 9798  10/07/19 1106 10/07/19 1336 10/08/19 0410  NA 134*  --  136  --  133*  --   --  136  --   --  135  K 3.0*   < > 2.7*   < > 2.9*   < > 3.1* 3.9 3.5 3.7 4.9  CL 108  --  112*  --  108  --   --  113*  --   --  113*  CO2 16*  --  17*  --  14*  --   --  13*  --   --  15*  GLUCOSE 88  --  73  --  76  --   --  76  --   --  134*  BUN 18  --  14  --  15  --   --  15  --   --  14  CREATININE 0.72  --  0.65  --  0.62  --   --  0.65  --   --  0.68  CALCIUM 7.3*  --  7.2*  --  7.4*  --   --  7.7*  --   --  7.6*  MG  --   --  1.5*  --  2.6*  --   --  2.0 3.1*  --  2.1   < > = values in this interval not displayed.   GFR: Estimated Creatinine Clearance: 65.3 mL/min (by C-G formula based on SCr of 0.68 mg/dL). Liver Function Tests: Recent Labs  Lab 10/02/19 2145 10/05/19 0630  AST 78* 29  ALT 22 14  ALKPHOS 66 52  BILITOT 0.7 0.6  PROT 5.4* 4.7*  ALBUMIN 2.6* 2.1*   Recent Labs  Lab 10/02/19 2145  LIPASE 145*   No results for input(s): AMMONIA in the last 168 hours. Coagulation Profile: Recent Labs  Lab 10/03/19 0150  INR 1.2   Cardiac Enzymes: No results for input(s): CKTOTAL, CKMB, CKMBINDEX, TROPONINI in the last 168 hours. BNP (last 3 results) No results for input(s): PROBNP in the last 8760 hours. HbA1C: No results for input(s): HGBA1C in the last 72 hours. CBG: Recent Labs  Lab 10/04/19 2035  GLUCAP 89   Lipid Profile: No results for input(s): CHOL, HDL, LDLCALC, TRIG, CHOLHDL, LDLDIRECT in the last 72 hours. Thyroid Function Tests: No results for input(s): TSH, T4TOTAL, FREET4, T3FREE, THYROIDAB in the last 72 hours. Anemia Panel: No results for  input(s): VITAMINB12, FOLATE, FERRITIN, TIBC, IRON, RETICCTPCT in the last 72 hours. Sepsis Labs: Recent Labs  Lab 10/03/19 0150 10/03/19 1046 10/04/19 0602 10/04/19 0848  LATICACIDVEN 4.4* 2.7* 1.6 1.6    Recent Results (from the past 240 hour(s))  SARS CORONAVIRUS 2 (TAT 6-24 HRS) Nasopharyngeal Nasopharyngeal Swab     Status: None   Collection Time: 09/28/19 10:37 AM   Specimen: Nasopharyngeal Swab  Result Value Ref Range Status   SARS Coronavirus 2 NEGATIVE NEGATIVE Final    Comment: (NOTE) SARS-CoV-2 target nucleic acids are NOT DETECTED. The SARS-CoV-2 RNA is generally detectable in upper and lower respiratory specimens during the acute phase of infection. Negative results do not preclude SARS-CoV-2 infection, do not rule out co-infections with other pathogens, and should not be used as the sole basis for treatment or other patient management decisions. Negative results must be combined with clinical observations, patient history, and epidemiological information. The expected result is Negative. Fact Sheet for Patients: SugarRoll.be Fact Sheet for Healthcare Providers: https://www.woods-mathews.com/ This test is not yet approved or  cleared by the [place] and  has been authorized for detection and/or diagnosis of SARS-CoV-2 by FDA under an Emergency Use Authorization (EUA). This EUA will remain  in effect (meaning this test can be used) for the duration of the COVID-19 declaration under Section 56 4(b)(1) of the Act, 21 U.S.C. section 360bbb-3(b)(1), unless the authorization is terminated or revoked sooner. Performed at [facility], [place] [address], [place], [place] [address]   Blood culture (routine x 2)     Status: None   Collection Time: 10/02/19  9:45 PM   Specimen: BLOOD  Result Value Ref Range Status   Specimen Description BLOOD LEFT ANTECUBITAL  Final   Special Requests   Final    BOTTLES DRAWN AEROBIC  AND ANAEROBIC Blood Culture results may not be optimal due to an inadequate volume of blood received in culture bottles   Culture   Final    NO GROWTH 5 DAYS Performed at [facility], [address], [place], [place] [address]    Report Status 10/07/2019 FINAL  Final  Blood culture (routine x 2)     Status: None   Collection Time: 10/02/19  9:48 PM   Specimen: BLOOD  Result Value Ref Range Status   Specimen Description BLOOD BLOOD LEFT ARM  Final   Special Requests   Final    BOTTLES DRAWN AEROBIC AND ANAEROBIC Blood Culture adequate volume   Culture   Final    NO GROWTH 5 DAYS Performed at [facility], [address] [place], [place] [address]    Report Status 10/07/2019 FINAL  Final  SARS Coronavirus 2 by RT PCR (hospital order, performed in [facility] hospital lab) Nasopharyngeal Nasopharyngeal Swab     Status: None   Collection Time: 10/02/19 11:48 PM   Specimen: Nasopharyngeal Swab  Result Value Ref Range Status   SARS Coronavirus 2 NEGATIVE NEGATIVE Final    Comment: (NOTE) SARS-CoV-2 target nucleic acids are NOT DETECTED. The SARS-CoV-2 RNA is generally detectable in upper and lower respiratory specimens during the acute phase of infection. The lowest concentration of SARS-CoV-2 viral copies this assay can detect is 250 copies / mL. A negative result does not preclude SARS-CoV-2 infection and should not be used as the sole basis for treatment or other patient management decisions.  A negative result may occur with improper specimen collection / handling, submission of specimen other than nasopharyngeal swab, presence of viral mutation(s) within the areas targeted by this assay, and inadequate number of viral copies (<250 copies / mL). A negative result must be combined with clinical observations, patient history, and epidemiological information. Fact Sheet for Patients:   StrictlyIdeas.no Fact Sheet for Healthcare  Providers: BankingDealers.co.za This test is not yet approved or cleared  by the [place] FDA and has been authorized for detection and/or diagnosis of SARS-CoV-2 by FDA under an Emergency Use Authorization (EUA).  This EUA will remain in effect (meaning this test can be used) for the duration of the COVID-19 declaration under Section 564(b)(1) of the Act, 21 U.S.C. section 360bbb-3(b)(1), unless the authorization is terminated or revoked sooner. Performed at [facility], [place], [place] [address]   C Difficile Quick Screen w PCR reflex     Status: None   Collection Time: 10/03/19  4:08 AM   Specimen: STOOL  Result Value Ref Range Status   C Diff antigen NEGATIVE NEGATIVE Final   C Diff toxin NEGATIVE NEGATIVE Final   C  Diff interpretation No C. difficile detected.  Final    Comment: Performed at [facility], [address] [place], [place] [address]  Gastrointestinal Panel by PCR , Stool     Status: None   Collection Time: 10/03/19  4:09 AM   Specimen: STOOL  Result Value Ref Range Status   Campylobacter species NOT DETECTED NOT DETECTED Final   Plesimonas shigelloides NOT DETECTED NOT DETECTED Final   Salmonella species NOT DETECTED NOT DETECTED Final   Yersinia enterocolitica NOT DETECTED NOT DETECTED Final   Vibrio species NOT DETECTED NOT DETECTED Final   Vibrio cholerae NOT DETECTED NOT DETECTED Final   Enteroaggregative E coli (EAEC) NOT DETECTED NOT DETECTED Final   Enteropathogenic E coli (EPEC) NOT DETECTED NOT DETECTED Final   Enterotoxigenic E coli (ETEC) NOT DETECTED NOT DETECTED Final   Shiga like toxin producing E coli (STEC) NOT DETECTED NOT DETECTED Final   Shigella/Enteroinvasive E coli (EIEC) NOT DETECTED NOT DETECTED Final   Cryptosporidium NOT DETECTED NOT DETECTED Final   Cyclospora cayetanensis NOT DETECTED NOT DETECTED Final   Entamoeba histolytica NOT DETECTED NOT DETECTED Final   Giardia  lamblia NOT DETECTED NOT DETECTED Final   Adenovirus F40/41 NOT DETECTED NOT DETECTED Final   Astrovirus NOT DETECTED NOT DETECTED Final   Norovirus GI/GII NOT DETECTED NOT DETECTED Final   Rotavirus A NOT DETECTED NOT DETECTED Final   Sapovirus (I, II, IV, and V) NOT DETECTED NOT DETECTED Final    Comment: Performed at [facility], [address] [place], [place] [address]  C Difficile Quick Screen w PCR reflex     Status: None   Collection Time: 10/03/19  4:14 AM   Specimen: Stool  Result Value Ref Range Status   C Diff antigen NEGATIVE NEGATIVE Final   C Diff toxin NEGATIVE NEGATIVE Final   C Diff interpretation No C. difficile detected.  Final    Comment: Performed at [facility], [place], [place] [address]      Imaging Studies   DG Abd 1 View  Result Date: 10/06/2019 CLINICAL DATA:  Ileus EXAM: ABDOMEN - 1 VIEW COMPARISON:  10/04/2019 FINDINGS: Gaseous distension of small and large bowel without obstructive pattern or over distention. The colonic gaseous distension is is greater than before. The enteric tube tip is at the mid stomach. Defibrillator pad over the left chest. IMPRESSION: Nonobstructive bowel gas pattern with moderate small bowel and colonic gas. Electronically Signed   By: Monte Fantasia M.D.   On: 10/06/2019 11:07   DG Shoulder Right Port  Result Date: 10/07/2019 CLINICAL DATA:  Postop right shoulder arthroplasty EXAM: PORTABLE RIGHT SHOULDER COMPARISON:  None. FINDINGS: Changes of right shoulder replacement. Normal alignment. No visible complicating feature. IMPRESSION: Right shoulder replacement.  No visible complicating feature. Electronically Signed   By: Rolm Baptise M.D.   On: 10/07/2019 18:20   [place] OR NERVE BLOCK-IMAGE ONLY [facility])  Result Date: 10/07/2019 There is no interpretation for this exam.  This order is for images obtained during a surgical procedure.  Please See "Surgeries" Tab for more information regarding the  procedure.   ECHOCARDIOGRAM COMPLETE  Result Date: 10/06/2019    ECHOCARDIOGRAM REPORT   Patient Name:   KRISTIANN NOYCE Date of Exam: 10/06/2019 Medical Rec #:  [id number]       Height:       63.0 in Accession #:    [id number]      Weight:       161.6  lb Date of Birth:  June 20, 1950       BSA:          1.766 m Patient Age:    67 years        BP:           168/74 mmHg Patient Gender: F               HR:           63 bpm. Exam Location:  [facility] Procedure: 2D Echo, Color Doppler and Cardiac Doppler Indications:     Preoperative evaluation  History:         Patient has prior history of Echocardiogram examinations. CAD,                  TIA; Risk Factors:Hypertension and Dyslipidemia.  Sonographer:     Charmayne Sheer RDCS (AE) Referring Phys:  [id number] [place] Diagnosing Phys: Kate Sable MD  Sonographer Comments: Suboptimal parasternal window. TDS due to inability to move pt due to dislocated rt shoulder and Image acquisition challenging due to respiratory motion. IMPRESSIONS  1. Left ventricular ejection fraction, by estimation, is 60 to 65%. The left ventricle has normal function. The left ventricle has no regional wall motion abnormalities. Left ventricular diastolic parameters are consistent with Grade I diastolic dysfunction (impaired relaxation).  2. Right ventricular systolic function is normal. The right ventricular size is normal.  3. The mitral valve is normal in structure. No evidence of mitral valve regurgitation. No evidence of mitral stenosis.  4. The aortic valve is grossly normal. Aortic valve regurgitation is not visualized. No aortic stenosis is present.  5. The inferior vena cava is normal in size with greater than 50% respiratory variability, suggesting right atrial pressure of 3 mmHg. FINDINGS  Left Ventricle: Left ventricular ejection fraction, by estimation, is 60 to 65%. The left ventricle has normal function. The left ventricle has no regional wall motion abnormalities. The left  ventricular internal cavity size was normal in size. There is  no left ventricular hypertrophy. Left ventricular diastolic parameters are consistent with Grade I diastolic dysfunction (impaired relaxation). Right Ventricle: The right ventricular size is normal. No increase in right ventricular wall thickness. Right ventricular systolic function is normal. Left Atrium: Left atrial size was normal in size. Right Atrium: Right atrial size was normal in size. Pericardium: There is no evidence of pericardial effusion. Mitral Valve: The mitral valve is normal in structure. Normal mobility of the mitral valve leaflets. No evidence of mitral valve regurgitation. No evidence of mitral valve stenosis. MV peak gradient, 6.1 mmHg. The mean mitral valve gradient is 2.0 mmHg. Tricuspid Valve: The tricuspid valve is normal in structure. Tricuspid valve regurgitation is not demonstrated. No evidence of tricuspid stenosis. Aortic Valve: The aortic valve is grossly normal. Aortic valve regurgitation is not visualized. No aortic stenosis is present. Aortic valve mean gradient measures 7.0 mmHg. Aortic valve peak gradient measures 16.6 mmHg. Aortic valve area, by VTI measures  2.98 cm. Pulmonic Valve: The pulmonic valve was not well visualized. Pulmonic valve regurgitation is not visualized. No evidence of pulmonic stenosis. Aorta: The aortic root is normal in size and structure. Venous: The inferior vena cava is normal in size with greater than 50% respiratory variability, suggesting right atrial pressure of 3 mmHg. IAS/Shunts: No atrial level shunt detected by color flow Doppler.  LEFT VENTRICLE PLAX 2D LVIDd:         4.03 cm     Diastology LVIDs:  2.47 cm     LV e' lateral:   7.72 cm/s LV PW:         1.04 cm     LV E/e' lateral: 8.6 LV IVS:        0.84 cm     LV e' medial:    5.66 cm/s LVOT diam:     2.20 cm     LV E/e' medial:  11.7 LV SV:         119 LV SV Index:   67 LVOT Area:     3.80 cm  LV Volumes (MOD) LV vol d, MOD  A4C: 71.8 ml LV vol s, MOD A4C: 28.5 ml LV SV MOD A4C:     71.8 ml RIGHT VENTRICLE RV Basal diam:  2.99 cm LEFT ATRIUM             Index       RIGHT ATRIUM           Index LA diam:        3.70 cm 2.09 cm/m  RA Area:     10.50 cm LA Vol (A2C):   73.5 ml 41.62 ml/m RA Volume:   20.30 ml  11.49 ml/m LA Vol (A4C):   40.6 ml 22.99 ml/m LA Biplane Vol: 55.9 ml 31.65 ml/m  AORTIC VALVE AV Area (Vmax):    2.72 cm AV Area (Vmean):   2.86 cm AV Area (VTI):     2.98 cm AV Vmax:           204.00 cm/s AV Vmean:          119.000 cm/s AV VTI:            0.398 m AV Peak Grad:      16.6 mmHg AV Mean Grad:      7.0 mmHg LVOT Vmax:         146.00 cm/s LVOT Vmean:        89.600 cm/s LVOT VTI:          0.312 m LVOT/AV VTI ratio: 0.78  AORTA Ao Root diam: 3.00 cm MITRAL VALVE MV Area (PHT): 2.66 cm     SHUNTS MV Peak grad:  6.1 mmHg     Systemic VTI:  0.31 m MV Mean grad:  2.0 mmHg     Systemic Diam: 2.20 cm MV Vmax:       1.23 m/s MV Vmean:      70.8 cm/s MV Decel Time: 285 msec MV E velocity: 66.50 cm/s MV A velocity: 112.00 cm/s MV E/A ratio:  0.59 Kate Sable MD Electronically signed by Kate Sable MD Signature Date/Time: 10/06/2019/1:44:31 PM    Final      Medications   Scheduled Meds: . Chlorhexidine Gluconate Cloth  6 each Topical Daily  . citalopram  40 mg Oral Daily  . docusate sodium  100 mg Oral BID  . enoxaparin (LOVENOX) injection  40 mg Subcutaneous Q24H  . lamoTRIgine  200 mg Oral Daily  . latanoprost  1 drop Both Eyes QHS  . pantoprazole (PROTONIX) IV  40 mg Intravenous Q12H   Continuous Infusions: . 0.9 % NaCl with KCl 40 mEq / L 100 mL/hr at 10/08/19 0000  .  ceFAZolin (ANCEF) IV 2 g (10/08/19 0309)       LOS: 5 days    Time spent: 40 minutes with >50% spent in coordination of care and direct patient contact.    Ezekiel Slocumb, DO [facility]  10/08/2019, 8:00 AM  If 7PM-7AM, please contact night-coverage. How to contact the [facility] Attending or Consulting  provider [place] or covering provider during after hours [place], for this patient?    1. Check the care team in [facility] and look for a) attending/consulting [facility] provider listed and b) the [facility] team listed 2. Log into [email or web address] and use [place]'s universal password to access. If you do not have the password, please contact the hospital operator. 3. Locate the [facility] provider you are looking for under [facility] and page to a number that you can be directly reached. 4. If you still have difficulty reaching the provider, please page the [facility] (Director on Call) for the Hospitalists listed on amion for assistance.

## 2019-10-08 NOTE — Consult Note (Addendum)
PHARMACY CONSULT NOTE - FOLLOW UP  Pharmacy Consult for Electrolyte Monitoring and Replacement   Recent Labs: Potassium (mmol/L)  Date Value  10/08/2019 4.9   Magnesium (mg/dL)  Date Value  10/08/2019 2.1   Calcium (mg/dL)  Date Value  10/08/2019 7.6 (L)   Albumin (g/dL)  Date Value  10/05/2019 2.1 (L)   Sodium (mmol/L)  Date Value  10/08/2019 135     Assessment: 69 year old admitted with diffuse ileus, and recent right total shoulder arthroplasty on 5/27.  Nausea and vomiting reported in chart, but seems to be improving.  She appears to be having liquid stool from rectal tube, and high output from NG tube.  Pharmacy has been consulted to replace potassium.  Goal of Therapy:  Potassium >4  Plan:  6/4 No replacement needed at this time. Patient is receiving NaCl w/KCL 23mEq @ 153ml/hr. Will recommended DC or removing KCL from fluids.   Pharmacy will continue to follow and replace electrolytes as needed.   Pernell Dupre, PharmD, BCPS Clinical Pharmacist 10/08/2019 9:04 AM

## 2019-10-08 NOTE — Progress Notes (Signed)
Progress Note  Patient Name: Sydney Evans Date of Encounter: 10/08/2019  Primary Cardiologist: New to [facility] - consult by Agbor[place]Etang  Subjective   No chest pain or dyspnea. 15 beats of NSVT 6/3, not available for review any longer, prior to electrolyte repletion. Potassium repleted to 4.9 this morning. Magnesium 2.1. Status post open reduction of the right shoulder arthroplasty dislocation with repair of subscapularis tendon repair.   Inpatient Medications    Scheduled Meds: . Chlorhexidine Gluconate Cloth  6 each Topical Daily  . citalopram  40 mg Oral Daily  . docusate sodium  100 mg Oral BID  . enoxaparin (LOVENOX) injection  40 mg Subcutaneous Q24H  . lamoTRIgine  200 mg Oral Daily  . latanoprost  1 drop Both Eyes QHS  . pantoprazole (PROTONIX) IV  40 mg Intravenous Q12H   Continuous Infusions:  PRN Meds: bisacodyl, diphenhydrAMINE, diphenhydrAMINE, fluticasone, hydrALAZINE, magnesium hydroxide, metoCLOPramide **OR** metoCLOPramide (REGLAN) injection, ondansetron **OR** ondansetron (ZOFRAN) IV, polyvinyl alcohol, sodium phosphate, tiZANidine, traMADol, traZODone   Vital Signs    Vitals:   10/07/19 2315 10/08/19 0017 10/08/19 0505 10/08/19 0737  BP: 111/62 (!) 141/71 (!) 161/80 (!) 160/70  Pulse:  70 81 75  Resp: 20 19 19 16   Temp:  98.4 F (36.9 C) 98.5 F (36.9 C) 98 F (36.7 C)  TempSrc:  Oral Oral Oral  SpO2:  97% 98% 96%  Weight:   72.9 kg   Height:        Intake/Output Summary (Last 24 hours) at 10/08/2019 1133 Last data filed at 10/08/2019 1055 Gross per 24 hour  Intake 1971 ml  Output 1575 ml  Net 396 ml   Filed Weights   10/06/19 0338 10/07/19 0408 10/08/19 0505  Weight: 73.3 kg 70.8 kg 72.9 kg    Telemetry    SR with 15 beat run of NSVT 6/3, no longer available for review - Personally Reviewed  ECG    No new tracings - Personally Reviewed  Physical Exam   GEN: No acute distress.   Neck: No JVD. Cardiac: RRR, no murmurs, rubs, or  gallops.  Respiratory: Clear to auscultation bilaterally.  GI: Soft, nontender, non-distended.   MS: No edema; No deformity. Neuro:  Alert and oriented x 3; Nonfocal.  Psych: Normal affect.  Labs    Chemistry Recent Labs  Lab 10/02/19 2145 10/03/19 0150 10/05/19 0630 10/05/19 1410 10/06/19 1403 10/06/19 2004 10/07/19 0531 10/07/19 0531 10/07/19 1106 10/07/19 1336 10/08/19 0410  NA 131*   < > 134*   < > 133*  --  136  --   --   --  135  K 2.2*   < > 3.0*   < > 2.9*   < > 3.9   < > 3.5 3.7 4.9  CL 106   < > 108   < > 108  --  113*  --   --   --  113*  CO2 14*   < > 16*   < > 14*  --  13*  --   --   --  15*  GLUCOSE 167*   < > 88   < > 76  --  76  --   --   --  134*  BUN 15   < > 18   < > 15  --  15  --   --   --  14  CREATININE 1.37*   < > 0.72   < > 0.62  --  0.65  --   --   --  0.68  CALCIUM 7.1*   < > 7.3*   < > 7.4*  --  7.7*  --   --   --  7.6*  PROT 5.4*  --  4.7*  --   --   --   --   --   --   --   --   ALBUMIN 2.6*  --  2.1*  --   --   --   --   --   --   --   --   AST 78*  --  29  --   --   --   --   --   --   --   --   ALT 22  --  14  --   --   --   --   --   --   --   --   ALKPHOS 66  --  52  --   --   --   --   --   --   --   --   BILITOT 0.7  --  0.6  --   --   --   --   --   --   --   --   GFRNONAA 40*   < > >60   < > >60  --  >60  --   --   --  >60  GFRAA 46*   < > >60   < > >60  --  >60  --   --   --  >60  ANIONGAP 11   < > 10   < > 11  --  10  --   --   --  7   < > = values in this interval not displayed.     Hematology Recent Labs  Lab 10/06/19 0602 10/07/19 0531 10/08/19 0410  WBC 8.8 8.9 9.4  RBC 3.55* 3.82* 3.60*  HGB 8.0* 8.5* 8.0*  HCT 24.5* 26.7* 26.0*  MCV 69.0* 69.9* 72.2*  MCH 22.5* 22.3* 22.2*  MCHC 32.7 31.8 30.8  RDW 17.5* 17.9* 17.9*  PLT 341 353 359    Cardiac EnzymesNo results for input(s): TROPONINI in the last 168 hours. No results for input(s): TROPIPOC in the last 168 hours.   BNPNo results for input(s): BNP, PROBNP in  the last 168 hours.   DDimer No results for input(s): DDIMER in the last 168 hours.   Radiology    DG Abd 1 View  Result Date: 10/06/2019 IMPRESSION: Nonobstructive bowel gas pattern with moderate small bowel and colonic gas. Electronically Signed   By: Monte Fantasia M.D.   On: 10/06/2019 11:07   Cardiac Studies   2D echo 10/06/2019: 1. Left ventricular ejection fraction, by estimation, is 60 to 65%. The  left ventricle has normal function. The left ventricle has no regional  wall motion abnormalities. Left ventricular diastolic parameters are  consistent with Grade I diastolic  dysfunction (impaired relaxation).  2. Right ventricular systolic function is normal. The right ventricular  size is normal.  3. The mitral valve is normal in structure. No evidence of mitral valve  regurgitation. No evidence of mitral stenosis.  4. The aortic valve is grossly normal. Aortic valve regurgitation is not  visualized. No aortic stenosis is present.  5. The inferior vena cava is normal in size with greater than 50%  respiratory variability, suggesting right atrial pressure of 3 mmHg.  Patient Profile     69 y.o. female with history of HTN, HLD, TIA, carotid artery stenosis, and vertigo who we are seeing for preoperative evaluation and NSVT.  Assessment & Plan    1. NSVT: -Asymptomatic  -Likely in the setting of electrolyte abnormalities  -Add metoprolol 25 mg bid -Echo with preserved LVSF -Recommend maintaining potassium goal of 4.0 and magnesium goal of 2.0   3. Carotid artery disease: -ASA and statin when able  For questions or updates, please contact [place] Please consult [email or web address] for contact info under Cardiology/STEMI.    Signed, Christell Faith, PA-C [place] Pager: [phone] 10/08/2019, 11:33 AM

## 2019-10-08 NOTE — Progress Notes (Signed)
Subjective:  CC: Sydney Evans is a 69 y.o. female  Hospital stay day 5, 1 Day Post-Op shoulder surgery for dislocated shoulder.  Started developing sudden onset left lower quadrant, nonradiating, sharp intermittent abdominal pain.  No specific alleviating or worsening factors including movements.  She just had her Foley catheter and rectal tube removed earlier today.  Last bowel movement was adequate stool noted within the bag is still waiting to void.  Denies any nausea vomiting and tolerated a little bit of regular food this morning as well.  She states in between the intermittent episodes, she does not experience any pain, but when the pain does occur it is very severe.  HPI: S/p above.    ROS:  General: Denies weight loss, weight gain, fatigue, fevers, chills, and night sweats. Heart: Denies chest pain, palpitations, racing heart, irregular heartbeat, leg pain or swelling, and decreased activity tolerance. Respiratory: Denies breathing difficulty, shortness of breath, wheezing, cough, and sputum. GI: Denies change in appetite, heartburn, nausea, vomiting, constipation, diarrhea, and blood in stool. GU: Denies difficulty urinating, pain with urinating, urgency, frequency, blood in urine.   Objective:   Temp:  [97.5 F (36.4 C)-98.9 F (37.2 C)] 98.9 F (37.2 C) (06/04 1527) Pulse Rate:  [69-89] 89 (06/04 1527) Resp:  [15-20] 18 (06/04 1141) BP: (93-162)/(49-85) 162/85 (06/04 1527) SpO2:  [93 %-100 %] 99 % (06/04 1527) Weight:  [72.9 kg] 72.9 kg (06/04 0505)     Height: 5\' 3"  (160 cm) Weight: 72.9 kg BMI (Calculated): 28.49   Intake/Output this shift:   Intake/Output Summary (Last 24 hours) at 10/08/2019 1626 Last data filed at 10/08/2019 1415 Gross per 24 hour  Intake 950 ml  Output 2125 ml  Net -1175 ml    Constitutional :  alert, cooperative, appears stated age and mild distress  Respiratory:  clear to auscultation bilaterally  Cardiovascular:  regular rate and rhythm   Gastrointestinal: Soft, no guarding, with point tenderness in left lower quadrant, occasional suprapubic tenderness when she is having the pain episodes.  She is still tender to palpation in between the episodes..   Skin: Cool and moist.  No scars or rashes in the abdominal area.  Psychiatric: Normal affect, non-agitated, not confused       LABS:  CMP Latest Ref Rng & Units 10/08/2019 10/07/2019 10/07/2019  Glucose 70 - 99 mg/dL 134(H) - -  BUN 8 - 23 mg/dL 14 - -  Creatinine 0.44 - 1.00 mg/dL 0.68 - -  Sodium 135 - 145 mmol/L 135 - -  Potassium 3.5 - 5.1 mmol/L 4.9 3.7 3.5  Chloride 98 - 111 mmol/L 113(H) - -  CO2 22 - 32 mmol/L 15(L) - -  Calcium 8.9 - 10.3 mg/dL 7.6(L) - -  Total Protein 6.5 - 8.1 g/dL - - -  Total Bilirubin 0.3 - 1.2 mg/dL - - -  Alkaline Phos 38 - 126 U/L - - -  AST 15 - 41 U/L - - -  ALT 0 - 44 U/L - - -   CBC Latest Ref Rng & Units 10/08/2019 10/07/2019 10/06/2019  WBC 4.0 - 10.5 K/uL 9.4 8.9 8.8  Hemoglobin 12.0 - 15.0 g/dL 8.0(L) 8.5(L) 8.0(L)  Hematocrit 36.0 - 46.0 % 26.0(L) 26.7(L) 24.5(L)  Platelets 150 - 400 K/uL 359 353 341    RADS: CLINICAL DATA:  Left upper quadrant abdominal pain  EXAM: PORTABLE ABDOMEN - 1 VIEW  COMPARISON:  10/06/2019  FINDINGS: Gas-filled loops of small and large bowel. Degree of distention  is not substantially changed. Limited evaluation for free air on this study.  IMPRESSION: Gas-filled loops of small and large bowel, which may reflect ileus. The degree of distention is not substantially changed since the prior study.   Electronically Signed   By: Macy Mis M.D.   On: 10/08/2019 16:12  Assessment:   image is not impressive and her pain is very intermittent, but severe when she has them. will start with simethicone and see if it can take the edge off, possible gas pain after recent rectal tube removal.  Not sure if CT is going to be of any benefit at this point. will consider it if she doesnt improve at  all she also could potentially be having intermittent volvulus that we are not seeing on imaging studies, but the chronicity of the distention noted over several days on plain films indicate this is less likely cause.

## 2019-10-08 NOTE — Progress Notes (Signed)
Subjective: 1 Day Post-Op Procedure(s) (LRB): OPEN REDUCTION INTERNAL FIXATION (ORIF) PERIPROSTHETIC FRACTURE (Right) Patient reports pain as 0 on 0-10 scale.   Block still in effect for the right arm. Patient is well, reports no issues with the right shoulder Plan is to go Home after hospital stay. Negative for chest pain and shortness of breath Fever: no Gastrointestinal:Negative for N/V this AM, NG Tube was removed this AM.  Objective: Vital signs in last 24 hours: Temp:  [97.5 F (36.4 C)-98.7 F (37.1 C)] 98 F (36.7 C) (06/04 0737) Pulse Rate:  [69-82] 75 (06/04 0737) Resp:  [15-22] 16 (06/04 0737) BP: (93-161)/(49-80) 160/70 (06/04 0737) SpO2:  [93 %-100 %] 96 % (06/04 0737) Weight:  [72.9 kg] 72.9 kg (06/04 0505)  Intake/Output from previous day:  Intake/Output Summary (Last 24 hours) at 10/08/2019 1138 Last data filed at 10/08/2019 1055 Gross per 24 hour  Intake 1971 ml  Output 1575 ml  Net 396 ml    Intake/Output this shift: Total I/O In: -  Out: 650 [Urine:650]  Labs: Recent Labs    10/05/19 1345 10/05/19 2148 10/06/19 0602 10/07/19 0531 10/08/19 0410  HGB 8.4* 8.2* 8.0* 8.5* 8.0*   Recent Labs    10/07/19 0531 10/08/19 0410  WBC 8.9 9.4  RBC 3.82* 3.60*  HCT 26.7* 26.0*  PLT 353 359   Recent Labs    10/07/19 0531 10/07/19 1106 10/07/19 1336 10/08/19 0410  NA 136  --   --  135  K 3.9   < > 3.7 4.9  CL 113*  --   --  113*  CO2 13*  --   --  15*  BUN 15  --   --  14  CREATININE 0.65  --   --  0.68  GLUCOSE 76  --   --  134*  CALCIUM 7.7*  --   --  7.6*   < > = values in this interval not displayed.   No results for input(s): LABPT, INR in the last 72 hours.   EXAM General - Patient is Alert, Appropriate and Oriented Extremity - Incision: dressing C/D/I No cellulitis present  Decrease sensation to light touch to the right arm this AM from recent nerve block.  Flexion and extension of fingers intact. Dressing/Incision - clean, dry,  no drainage Motor Function - intact, moving foot and toes well on exam.   Past Medical History:  Diagnosis Date  . Anemia   . Anxiety   . Arthritis   . Barrett's esophagus   . Bipolar disorder ([place])   . 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 Blood clot in vein   . Cancer ([place])    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  . Carotid arterial disease ([place])   . Contracture of joint of upper arm   . Depression   . Elevated lipids   . GERD (gastroesophageal reflux disease)   . Hypertension   . Migraine   . Osteoporosis   . Poor balance   . Sinus congestion   . 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 TIA (transient ischemic attack)   . Vertigo     Assessment/Plan: 1 Day Post-Op Procedure(s) (LRB): OPEN REDUCTION INTERNAL FIXATION (ORIF) PERIPROSTHETIC FRACTURE (Right) Principal Problem:   Ileus (HCC) Active Problems:   Pre-op evaluation   NSVT (nonsustained ventricular tachycardia) (HCC)   Dislocation of right shoulder joint   Hypokalemia   Hypomagnesemia  Estimated body mass index is 28.48 kg/m as calculated from the  following:   Height as of this encounter: 5\' 3"  (1.6 m).   Weight as of this encounter: 72.9 kg. Advance diet Up with therapy   Work with PT on gentle ROM to the right shoulder.  Avoid external rotation. Continue with post-op course s/p right total shoulder replacement but will progress slowly due to recent dislocation and subsequent repeat subscapularis repair. Non-weightbearing to the right arm. HHPT upon discharge.  DVT Prophylaxis - Lovenox, Foot Pumps and TED hose Non-weightbearing to the right arm.  Raquel Whittaker Lenis, PA-C [facility] 10/08/2019, 11:38 AM

## 2019-10-08 NOTE — Progress Notes (Addendum)
Pt complaining of LUQ pain 10/10, pt screaming in pain, niece at bedside and states its the same pain that brought her to the ED. Niece is demanding to speak to MD. Dr. Ellis Savage notified, per MD will get Abd xray, and PRN fentanyl. Will continue to assess.

## 2019-10-08 NOTE — Evaluation (Signed)
Occupational Therapy Evaluation Patient Details Name: Sydney Evans MRN: [id number] DOB: Sep 14, 1950 Today's Date: 10/08/2019    History of Present Illness presented to ER secondary to hypotension, abdominal pain, nausea/vomiting, passed out x1 at home; admitted for management of acute ileus (managed with NPO, NGT) and noted anterior dislocation of R shoulder (s/p surgical relocation and repair of subscapularis disruption 6/3).  Of note, initial R TSR 09/30/19.   Clinical Impression   Sydney Evans presents awake, sitting up in recliner and motivated to participate in evaluation. Following R TSR (5/27), pt reports she was doing great with ADL/IADL mgt and functional mobility with RUE NWB status. She was able to dress indep with button up shirts and niece was managing sling and polar care. On the second night following discharge, pt reports becoming weak feeling, abdominal pain and called her niece before falling to the ground. Her niece attempted to lift her, causing R shoulder rotator cuff tear. Pt grossly able to complete hygiene while standing at the sink with Setup A and Min A for combing R side of head. Reports successful UB/LB dressing and bathing at home following surgery. No cues needed for maintaining UE precautions. No further skilled acute OT needs for this hospitalization. Pt will benefit from [facility] to optimize pt safety and return to PLOF.     Follow Up Recommendations  Home health OT    Equipment Recommendations  None recommended by OT    Recommendations for Other Services       Precautions / Restrictions Precautions Precautions: Shoulder;Fall Type of Shoulder Precautions: total Shoulder Interventions: Shoulder sling/immobilizer;Off for dressing/bathing/exercises Precaution Booklet Issued: No Required Braces or Orthoses: Sling Restrictions Weight Bearing Restrictions: Yes RUE Weight Bearing: Non weight bearing      Mobility Bed Mobility Overal bed mobility: Modified  Independent             General bed mobility comments: Unobserved, pt in recliner  Transfers Overall transfer level: Modified independent Equipment used: None Transfers: Sit to/from Stand Sit to Stand: Modified independent (Device/Increase time)         General transfer comment: stood without physical assist with increased time and help managing foley    Balance Overall balance assessment: Modified Independent Sitting-balance support: Feet supported;No upper extremity supported Sitting balance-Leahy Scale: Good Sitting balance - Comments: no unsteadiness noted with static sitting   Standing balance support: No upper extremity supported;During functional activity Standing balance-Leahy Scale: Good Standing balance comment: Pt maintained balance while weight shifting and completing hygiene at the sink with no noted unsteadiness or LOB                           ADL either performed or assessed with clinical judgement   ADL Overall ADL's : Needs assistance/impaired     Grooming: Adhering to UE precautions;Set up;Wash/dry face;Brushing hair;Minimal assistance Grooming Details (indicate cue type and reason): Setup A for washing/drying face standing at sink; Min A for brushing hair on R side due to RUE immobilization                             Functional mobility during ADLs: Modified independent(increased time, cautious movements) General ADL Comments: Pt grossly able to complete UB ADL while seated/standing with Setup A and adaptations for dressing. Reports successful UB/LB dressing and bathing at home following surgery. No cues needed for maintaining UE precautions.     Vision  Perception     Praxis      Pertinent Vitals/Pain Pain Assessment: Faces Faces Pain Scale: Hurts a little bit Pain Location: abdomen Pain Descriptors / Indicators: Aching Pain Intervention(s): Monitored during session;Repositioned     Hand Dominance Right    Extremity/Trunk Assessment Upper Extremity Assessment Upper Extremity Assessment: RUE deficits/detail;LUE deficits/detail RUE Deficits / Details: NWB following surgery; immobilized in shoulder sling with total shoulder precautions RUE: Unable to fully assess due to immobilization LUE Deficits / Details: grossly WFL   Lower Extremity Assessment Lower Extremity Assessment: Overall WFL for tasks assessed       Communication Communication Communication: 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 Pt re-educated on ADL management with RUE NWB to promote safety and return to PLOF. Pt verbalized understanding.   Shoulder Instructions      Home Living Family/patient expects to be discharged to:: Private residence Living Arrangements: Alone Available Help at Discharge: Family(niece lives close and is very supportive) Type of Home: Apartment Home Access: Level entry     Home Layout: One level     Bathroom Shower/Tub: Teacher, early years/pre: Standard     Home Equipment: Environmental consultant - 4 wheels;Cane - single point;Hand held shower head          Prior Functioning/Environment Level of Independence: Independent with assistive device(s)        Comments: Following R shoulder surgery (5/27), pt reports she was doing great with ADL/IADL mgt and functional mobility with NWB RUE status. She was able to dress with button up shirts, niece was managing sling and polar care and she had pre-prepared meals etc. On the second night following discharge, pt reports becoming weak  feeling, abdominal pain and called her niece before falling to the ground. Her niece attempted to lift her, causing R shoulder rotator cuff tear.        OT Problem List: Decreased strength;Decreased range of motion;Decreased knowledge of precautions;Decreased knowledge of use of DME or AE;Impaired UE functional use      OT Treatment/Interventions: Self-care/ADL training;Therapeutic exercise;Therapeutic activities;DME and/or AE instruction;Patient/family education    OT Goals(Current goals can be found in the care plan section) Acute Rehab OT Goals Patient Stated Goal: To return home safely OT Goal Formulation: All assessment and education complete, DC therapy  OT Frequency:     Barriers to D/C:            Co-evaluation              AM-PAC OT "6 Clicks" Daily Activity     Outcome Measure Help from another person eating meals?: None Help from another person taking care of personal grooming?: A Little Help from another person toileting, which includes using toliet, bedpan, or urinal?: None Help from another person bathing (including washing, rinsing, drying)?: None Help from another person to put on and taking off regular upper body clothing?: A Little Help from another person to put on and taking off regular lower  body clothing?: A Little 6 Click Score: 21   End of Session    Activity Tolerance: Patient tolerated treatment well Patient left: in chair;with call bell/phone within reach;with chair alarm set  OT Visit Diagnosis: Other abnormalities of gait and mobility (R26.89);Other (comment)(post-op shoulder deficits)                Time: 6720-9470 OT Time Calculation (min): 27 min Charges:  OT General Charges $OT Visit: 1 Visit OT Evaluation $OT Eval Low Complexity: 1 Low OT Treatments $Self Care/Home Management : 8-22 mins  Jerilynn Birkenhead, OTS 10/08/19, 12:59 PM

## 2019-10-09 DIAGNOSIS — E876 Hypokalemia: Secondary | ICD-10-CM

## 2019-10-09 LAB — BASIC METABOLIC PANEL
Anion gap: 6 (ref 5–15)
BUN: 10 mg/dL (ref 8–23)
CO2: 18 mmol/L — ABNORMAL LOW (ref 22–32)
Calcium: 7.6 mg/dL — ABNORMAL LOW (ref 8.9–10.3)
Chloride: 110 mmol/L (ref 98–111)
Creatinine, Ser: 0.52 mg/dL (ref 0.44–1.00)
GFR calc Af Amer: >60 mL/min (ref >60)
GFR calc non Af Amer: >60 mL/min (ref >60)
Glucose, Bld: 80 mg/dL (ref 70–99)
Potassium: 3.8 mmol/L (ref 3.5–5.1)
Sodium: 134 mmol/L — ABNORMAL LOW (ref 135–145)

## 2019-10-09 LAB — CBC
HCT: 23.1 % — ABNORMAL LOW (ref 36.0–46.0)
Hemoglobin: 7.4 g/dL — ABNORMAL LOW (ref 12.0–15.0)
MCH: 22.4 pg — ABNORMAL LOW (ref 26.0–34.0)
MCHC: 32 g/dL (ref 30.0–36.0)
MCV: 69.8 fL — ABNORMAL LOW (ref 80.0–100.0)
Platelets: 415 10*3/uL — ABNORMAL HIGH (ref 150–400)
RBC: 3.31 MIL/uL — ABNORMAL LOW (ref 3.87–5.11)
RDW: 18.2 % — ABNORMAL HIGH (ref 11.5–15.5)
WBC: 8.5 10*3/uL (ref 4.0–10.5)
nRBC: 0 % (ref 0.0–0.2)

## 2019-10-09 LAB — MAGNESIUM: Magnesium: 1.5 mg/dL — ABNORMAL LOW (ref 1.7–2.4)

## 2019-10-09 MED ORDER — POTASSIUM CHLORIDE 20 MEQ PO PACK
20.0000 meq | PACK | Freq: Once | ORAL | Status: AC
  Administered 2019-10-09: 20 meq via ORAL
  Filled 2019-10-09: qty 1

## 2019-10-09 MED ORDER — POTASSIUM CHLORIDE CRYS ER 20 MEQ PO TBCR: 40.0000 meq | EXTENDED_RELEASE_TABLET | Freq: Once | ORAL | Status: DC

## 2019-10-09 MED ORDER — MAGNESIUM SULFATE 4 GM/100ML IV SOLN
4.0000 g | Freq: Once | INTRAVENOUS | Status: AC
  Administered 2019-10-09: 4 g via INTRAVENOUS
  Filled 2019-10-09: qty 100

## 2019-10-09 NOTE — Consult Note (Addendum)
PHARMACY CONSULT NOTE - FOLLOW UP  Pharmacy Consult for Electrolyte Monitoring and Replacement   Recent Labs: Potassium (mmol/L)  Date Value  10/09/2019 3.8   Magnesium (mg/dL)  Date Value  10/09/2019 1.5 (L)   Calcium (mg/dL)  Date Value  10/09/2019 7.6 (L)   Albumin (g/dL)  Date Value  10/05/2019 2.1 (L)   Sodium (mmol/L)  Date Value  10/09/2019 134 (L)     Assessment: 69 year old admitted with diffuse ileus, and recent right total shoulder arthroplasty on 5/27.  Nausea and vomiting reported in chart, but seems to be improving.  She appears to be having liquid stool from rectal tube, and high output from NG tube.  Pharmacy has been consulted to replace potassium.  Goal of Therapy:  Potassium >4  Plan:  6/4 Will order KCL 19mEq x 1 dose.  Would recommend replacing Magnesium with Mag 2gm IV x 1 dose.   Pharmacy will continue to follow and replace electrolytes as needed.   Pernell Dupre, PharmD, BCPS Clinical Pharmacist 10/09/2019 9:35 AM

## 2019-10-09 NOTE — Progress Notes (Signed)
PROGRESS NOTE    Sydney Evans   JME:[id number]  DOB: 10-Jan-1951  PCP: Patient, No Pcp Per    DOA: 10/02/2019 LOS: 6   Brief Narrative   Sydney Evans is a 69 y.o. female admitted overnight to the hospitalist team with diffuse ileus.  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 Roland Rack.  Patient reports that since she got home, she started having abdominal distention, which progressed into nausea and vomiting.  In the ED, labs showed AKI with a creatinine of 1.37 from prior of 0.63, severe hypokalemia with potassium of 2.2, lactic acidosis of 4.4, and WBC of 14.  CT abdomen/pelvis showed generalized ileus with no transition point or evidence of bowel obstruction. The patient was empirically given IV cefepime and vancomycin and admitted for further management.  General surgery, GI, and orthopedics are following.     Assessment & Plan   Principal Problem:   Ileus ([place]) Active Problems:   NSVT (nonsustained ventricular tachycardia) (HCC)   Dislocation of right shoulder joint   Hypokalemia   Hypomagnesemia   Pre-op evaluation  Recurrent Left Lower Quadrant Abdominal Pain - onset this afternoon of 6/4 after lunch, normal diet.  Stat abdominal xray not impressive for recurrent obstruction.  Surgery re-consulted.  IV Fentanyl PRN.  Revert diet to full liquids.  Trial of simethicone seemed to help.  Ileus - POA.  Resolved.  In setting of shoulder surgery 5/27.  General surgery was consulted.  NG tube removed on 6/2.  Full liquid diet for now given pain recurrence.  As needed pain meds and antiemetics. Empiric antibiotics on admission were stopped and no signs of infection.   Right shoulder dislocation - POD 2 from [facility] repair and joint reduction with Dr. Roland Rack.  Patient had right shoulder arthroplasty on 5/27.  Patient had a fall at home after discharge resulting in dislocation and tearing of rotator cuff.   Tylenol, Tramadol PO, Fentanyl IV as needed for pain  control.  Hypertension -likely exacerbated due to pain.  History of chronic hypertension for which she takes lisinopril 40 mg daily.  Hold lisinopril.   IV hydralazine as needed.  NSVT (nonsustained ventricular tachycardia) -patient had 11 beat run of V. tach on telemetry in setting of hypokalemia.  Continue telemetry monitoring.  Replace potassium and magnesium, target K>4.0, Mg>2.0.  Cardiology consulted.  Metoprolol low dose started.  Hypokalemia -persistent, acute on chronic.  Patient has long history of hypokalemia which she manages at home by drinking Gatorade.  Have been aggressively replacing throughout admission.  Daily BMP's.  Hypomagnesemia -replacing with IV.  Monitor daily and replace for goal Mg>2.0.  Bradycardia -resolved.  Monitor.  Bipolar disorder -no acute issues.  Continue Lamictal.  GERD -continue PPI.  DVT prophylaxis: SCD's  Diet:  Diet Orders (From admission, onward)    Start     Ordered   10/08/19 1531  Diet full liquid Room service appropriate? Yes; Fluid consistency: Thin  Diet effective now    Question Answer Comment  Room service appropriate? Yes   Fluid consistency: Thin      10/08/19 1531            Code Status: DNR    Subjective 10/09/19    Patient seen up in chair this morning.  No acute events reported.  She says her left lower abdomen is still painful but does not appear in distress today her primary complaint at this time is severe shoulder pain, but she is declining to  take pain medicines because she said it makes her unsteady on her feet and she is scared of falling again.  She denies other acute complaints including fevers or chills, nausea vomiting, chest pain or shortness of breath.  Disposition Plan & Communication   Status is: Inpatient  Remains inpatient appropriate because:Inpatient level of care appropriate due to severity of illness .  Discharge pending clearance by orthopedics.  Dispo: The patient is from: Home               Anticipated d/c is to: Home              Anticipated d/c date is: 2 days              Patient currently is not medically stable to d/c.   Family Communication: none at bedside during encounter, will attempt to call   Consults, Procedures, Significant Events   Consultants:   Orthopedics  Cardiology  General surgery  Procedures:   None  Antimicrobials:   Vancomycin 5/29 >> 6/1  Cefepime 5/29 >> 6/2  Flagyl 5/30 >> 6/2   Objective   Vitals:   10/09/19 0521 10/09/19 0743 10/09/19 0813 10/09/19 1548  BP: (!) 161/85 (!) 168/63 (!) 175/70 (!) 141/65  Pulse: 67 60 64 (!) 52  Resp: 20 18  16   Temp: 98.2 F (36.8 C) 98.2 F (36.8 C)  98.3 F (36.8 C)  TempSrc: Oral   Oral  SpO2: 99% 97% 100% 100%  Weight: 72.1 kg     Height:        Intake/Output Summary (Last 24 hours) at 10/09/2019 1709 Last data filed at 10/09/2019 1350 Gross per 24 hour  Intake 480 ml  Output 1200 ml  Net -720 ml   Filed Weights   10/07/19 0408 10/08/19 0505 10/09/19 0521  Weight: 70.8 kg 72.9 kg 72.1 kg    Physical Exam:  General exam: awake, alert, no acute distress Respiratory system: CTAB, normal respiratory effort, no wheezes or rhonchi Cardiovascular system: normal S1/S2, RRR   Gastrointestinal system: LLQ less tender, soft, NT, ND, +bowel sounds. Central nervous system: A&O x4. no gross focal neurologic deficits, normal speech Extremities: RUE in immobilizing sling, distal pulse and sensation intact, no cyanosis, normal tone Psychiatry: normal mood, congruent affect, judgement and insight appear normal  Labs   Data Reviewed: I have personally reviewed following labs and imaging studies  CBC: Recent Labs  Lab 10/02/19 2145 10/03/19 0150 10/05/19 0630 10/05/19 1345 10/05/19 2148 10/06/19 0602 10/07/19 0531 10/08/19 0410 10/09/19 0552  WBC 14.0*   < > 9.0  --   --  8.8 8.9 9.4 8.5  NEUTROABS 12.6*  --   --   --   --   --   --   --   --   HGB 8.9*   < > 7.8*   < > 8.2*  8.0* 8.5* 8.0* 7.4*  HCT 29.5*   < > 24.0*   < > 25.9* 24.5* 26.7* 26.0* 23.1*  MCV 74.9*   < > 68.4*  --   --  69.0* 69.9* 72.2* 69.8*  PLT 402*   < > 341  --   --  341 353 359 415*   < > = values in this interval not displayed.   Basic Metabolic Panel: Recent Labs  Lab 10/06/19 0602 10/06/19 0602 10/06/19 1403 10/06/19 2004 10/07/19 0531 10/07/19 1106 10/07/19 1336 10/08/19 0410 10/09/19 0552  NA 136  --  133*  --  136  --   --  135 134*  K 2.7*   < > 2.9*   < > 3.9 3.5 3.7 4.9 3.8  CL 112*  --  108  --  113*  --   --  113* 110  CO2 17*  --  14*  --  13*  --   --  15* 18*  GLUCOSE 73  --  76  --  76  --   --  134* 80  BUN 14  --  15  --  15  --   --  14 10  CREATININE 0.65  --  0.62  --  0.65  --   --  0.68 0.52  CALCIUM 7.2*  --  7.4*  --  7.7*  --   --  7.6* 7.6*  MG 1.5*   < > 2.6*  --  2.0 3.1*  --  2.1 1.5*   < > = values in this interval not displayed.   GFR: Estimated Creatinine Clearance: 65 mL/min (by C-G formula based on SCr of 0.52 mg/dL). Liver Function Tests: Recent Labs  Lab 10/02/19 2145 10/05/19 0630  AST 78* 29  ALT 22 14  ALKPHOS 66 52  BILITOT 0.7 0.6  PROT 5.4* 4.7*  ALBUMIN 2.6* 2.1*   Recent Labs  Lab 10/02/19 2145  LIPASE 145*   No results for input(s): AMMONIA in the last 168 hours. Coagulation Profile: Recent Labs  Lab 10/03/19 0150  INR 1.2   Cardiac Enzymes: No results for input(s): CKTOTAL, CKMB, CKMBINDEX, TROPONINI in the last 168 hours. BNP (last 3 results) No results for input(s): PROBNP in the last 8760 hours. HbA1C: No results for input(s): HGBA1C in the last 72 hours. CBG: Recent Labs  Lab 10/04/19 2035  GLUCAP 89   Lipid Profile: No results for input(s): CHOL, HDL, LDLCALC, TRIG, CHOLHDL, LDLDIRECT in the last 72 hours. Thyroid Function Tests: No results for input(s): TSH, T4TOTAL, FREET4, T3FREE, THYROIDAB in the last 72 hours. Anemia Panel: No results for input(s): VITAMINB12, FOLATE, FERRITIN, TIBC, IRON,  RETICCTPCT in the last 72 hours. Sepsis Labs: Recent Labs  Lab 10/03/19 0150 10/03/19 1046 10/04/19 0602 10/04/19 0848  LATICACIDVEN 4.4* 2.7* 1.6 1.6    Recent Results (from the past 240 hour(s))  Blood culture (routine x 2)     Status: None   Collection Time: 10/02/19  9:45 PM   Specimen: BLOOD  Result Value Ref Range Status   Specimen Description BLOOD LEFT ANTECUBITAL  Final   Special Requests   Final    BOTTLES DRAWN AEROBIC AND ANAEROBIC Blood Culture results may not be optimal due to an inadequate volume of blood received in culture bottles   Culture   Final    NO GROWTH 5 DAYS Performed at [facility], [address], [place], [place] [address]    Report Status 10/07/2019 FINAL  Final  Blood culture (routine x 2)     Status: None   Collection Time: 10/02/19  9:48 PM   Specimen: BLOOD  Result Value Ref Range Status   Specimen Description BLOOD BLOOD LEFT ARM  Final   Special Requests   Final    BOTTLES DRAWN AEROBIC AND ANAEROBIC Blood Culture adequate volume   Culture   Final    NO GROWTH 5 DAYS Performed at [facility], [address], [place], [place] [address]    Report Status 10/07/2019 FINAL  Final  SARS Coronavirus 2 by RT PCR (hospital order, performed in [facility] hospital lab) Nasopharyngeal Nasopharyngeal  Swab     Status: None   Collection Time: 10/02/19 11:48 PM   Specimen: Nasopharyngeal Swab  Result Value Ref Range Status   SARS Coronavirus 2 NEGATIVE NEGATIVE Final    Comment: (NOTE) SARS-CoV-2 target nucleic acids are NOT DETECTED. The SARS-CoV-2 RNA is generally detectable in upper and lower respiratory specimens during the acute phase of infection. The lowest concentration of SARS-CoV-2 viral copies this assay can detect is 250 copies / mL. A negative result does not preclude SARS-CoV-2 infection and should not be used as the sole basis for treatment or other patient management decisions.  A negative result may occur  with improper specimen collection / handling, submission of specimen other than nasopharyngeal swab, presence of viral mutation(s) within the areas targeted by this assay, and inadequate number of viral copies (<250 copies / mL). A negative result must be combined with clinical observations, patient history, and epidemiological information. Fact Sheet for Patients:   StrictlyIdeas.no Fact Sheet for Healthcare Providers: BankingDealers.co.za This test is not yet approved or cleared  by the [place] FDA and has been authorized for detection and/or diagnosis of SARS-CoV-2 by FDA under an Emergency Use Authorization (EUA).  This EUA will remain in effect (meaning this test can be used) for the duration of the COVID-19 declaration under Section 564(b)(1) of the Act, 21 U.S.C. section 360bbb-3(b)(1), unless the authorization is terminated or revoked sooner. Performed at [facility], [place], [place] [address]   C Difficile Quick Screen w PCR reflex     Status: None   Collection Time: 10/03/19  4:08 AM   Specimen: STOOL  Result Value Ref Range Status   C Diff antigen NEGATIVE NEGATIVE Final   C Diff toxin NEGATIVE NEGATIVE Final   C Diff interpretation No C. difficile detected.  Final    Comment: Performed at [facility], [address] [place], [place] [address]  Gastrointestinal Panel by PCR , Stool     Status: None   Collection Time: 10/03/19  4:09 AM   Specimen: STOOL  Result Value Ref Range Status   Campylobacter species NOT DETECTED NOT DETECTED Final   Plesimonas shigelloides NOT DETECTED NOT DETECTED Final   Salmonella species NOT DETECTED NOT DETECTED Final   Yersinia enterocolitica NOT DETECTED NOT DETECTED Final   Vibrio species NOT DETECTED NOT DETECTED Final   Vibrio cholerae NOT DETECTED NOT DETECTED Final   Enteroaggregative E coli (EAEC) NOT DETECTED NOT DETECTED Final    Enteropathogenic E coli (EPEC) NOT DETECTED NOT DETECTED Final   Enterotoxigenic E coli (ETEC) NOT DETECTED NOT DETECTED Final   Shiga like toxin producing E coli (STEC) NOT DETECTED NOT DETECTED Final   Shigella/Enteroinvasive E coli (EIEC) NOT DETECTED NOT DETECTED Final   Cryptosporidium NOT DETECTED NOT DETECTED Final   Cyclospora cayetanensis NOT DETECTED NOT DETECTED Final   Entamoeba histolytica NOT DETECTED NOT DETECTED Final   Giardia lamblia NOT DETECTED NOT DETECTED Final   Adenovirus F40/41 NOT DETECTED NOT DETECTED Final   Astrovirus NOT DETECTED NOT DETECTED Final   Norovirus GI/GII NOT DETECTED NOT DETECTED Final   Rotavirus A NOT DETECTED NOT DETECTED Final   Sapovirus (I, II, IV, and V) NOT DETECTED NOT DETECTED Final    Comment: Performed at [facility], [address] [place], [place] [address]  C Difficile Quick Screen w PCR reflex     Status: None   Collection Time: 10/03/19  4:14 AM   Specimen: Stool  Result  Value Ref Range Status   C Diff antigen NEGATIVE NEGATIVE Final   C Diff toxin NEGATIVE NEGATIVE Final   C Diff interpretation No C. difficile detected.  Final    Comment: Performed at [facility], [address], [place], [place] [address]      Imaging Studies   DG Shoulder Right Port  Result Date: 10/07/2019 CLINICAL DATA:  Postop right shoulder arthroplasty EXAM: PORTABLE RIGHT SHOULDER COMPARISON:  None. FINDINGS: Changes of right shoulder replacement. Normal alignment. No visible complicating feature. IMPRESSION: Right shoulder replacement.  No visible complicating feature. Electronically Signed   By: Rolm Baptise M.D.   On: 10/07/2019 18:20   DG Abd Portable 1V  Result Date: 10/08/2019 CLINICAL DATA:  Left upper quadrant abdominal pain EXAM: PORTABLE ABDOMEN - 1 VIEW COMPARISON:  10/06/2019 FINDINGS: Gas-filled loops of small and large bowel. Degree of distention is not substantially changed. Limited evaluation for free air on this  study. IMPRESSION: Gas-filled loops of small and large bowel, which may reflect ileus. The degree of distention is not substantially changed since the prior study. Electronically Signed   By: Macy Mis M.D.   On: 10/08/2019 16:12     Medications   Scheduled Meds: . Chlorhexidine Gluconate Cloth  6 each Topical Daily  . citalopram  40 mg Oral Daily  . docusate sodium  100 mg Oral BID  . enoxaparin (LOVENOX) injection  40 mg Subcutaneous Q24H  . lamoTRIgine  200 mg Oral Daily  . latanoprost  1 drop Both Eyes QHS  . metoprolol tartrate  12.5 mg Oral BID  . pantoprazole (PROTONIX) IV  40 mg Intravenous Q12H   Continuous Infusions:      LOS: 6 days    Time spent: 25 minutes    Ezekiel Slocumb, DO [facility]  10/09/2019, 5:09 PM    If 7PM-7AM, please contact night-coverage. How to contact the [facility] Attending or Consulting provider [place] or covering provider during after hours [place], for this patient?    1. Check the care team in [facility] and look for a) attending/consulting [facility] provider listed and b) the [facility] team listed 2. Log into [email or web address] and use [place]'s universal password to access. If you do not have the password, please contact the hospital operator. 3. Locate the [facility] provider you are looking for under [facility] and page to a number that you can be directly reached. 4. If you still have difficulty reaching the provider, please page the [facility] (Director on Call) for the Hospitalists listed on amion for assistance.

## 2019-10-09 NOTE — Progress Notes (Signed)
Subjective:  CC: Sydney Evans is a 69 y.o. female  Hospital stay day 6, ileus and new onset abdominal pain   HPI: No acute issues overnight.  Patient states pain is still persistent with no change in overall severity or frequency.  Despite this, she has not required any additional pain medications since exam yesterday.  Also states she has had multiple passage of flatus as well as bowel movements.  Still no vomiting, but she does report a lack of appetite.  ROS:  General: Denies weight loss, weight gain, fatigue, fevers, chills, and night sweats. Heart: Denies chest pain, palpitations, racing heart, irregular heartbeat, leg pain or swelling, and decreased activity tolerance. Respiratory: Denies breathing difficulty, shortness of breath, wheezing, cough, and sputum. GI: Denies change in appetite, heartburn, nausea, vomiting, constipation, diarrhea, and blood in stool. GU: Denies difficulty urinating, pain with urinating, urgency, frequency, blood in urine.   Objective:   Temp:  [98.2 F (36.8 C)-98.9 F (37.2 C)] 98.2 F (36.8 C) (06/05 0743) Pulse Rate:  [60-89] 64 (06/05 0813) Resp:  [18-20] 18 (06/05 0743) BP: (139-175)/(63-85) 175/70 (06/05 0813) SpO2:  [97 %-100 %] 100 % (06/05 0813) Weight:  [72.1 kg] 72.1 kg (06/05 0521)     Height: 5\' 3"  (160 cm) Weight: 72.1 kg BMI (Calculated): 28.17   Intake/Output this shift:   Intake/Output Summary (Last 24 hours) at 10/09/2019 1048 Last data filed at 10/09/2019 1000 Gross per 24 hour  Intake 240 ml  Output 2550 ml  Net -2310 ml    Constitutional :  alert, cooperative, appears stated age and no distress  Respiratory:  clear to auscultation bilaterally  Cardiovascular:  regular rate and rhythm  Gastrointestinal: Soft, no guarding, persistent tenderness to palpation in left lower quadrant and suprapubic region unchanged from previous exam..   Skin: Cool and moist.   Psychiatric: Normal affect, non-agitated, not confused        LABS:  CMP Latest Ref Rng & Units 10/09/2019 10/08/2019 10/07/2019  Glucose 70 - 99 mg/dL 80 134(H) -  BUN 8 - 23 mg/dL 10 14 -  Creatinine 0.44 - 1.00 mg/dL 0.52 0.68 -  Sodium 135 - 145 mmol/L 134(L) 135 -  Potassium 3.5 - 5.1 mmol/L 3.8 4.9 3.7  Chloride 98 - 111 mmol/L 110 113(H) -  CO2 22 - 32 mmol/L 18(L) 15(L) -  Calcium 8.9 - 10.3 mg/dL 7.6(L) 7.6(L) -  Total Protein 6.5 - 8.1 g/dL - - -  Total Bilirubin 0.3 - 1.2 mg/dL - - -  Alkaline Phos 38 - 126 U/L - - -  AST 15 - 41 U/L - - -  ALT 0 - 44 U/L - - -   CBC Latest Ref Rng & Units 10/09/2019 10/08/2019 10/07/2019  WBC 4.0 - 10.5 K/uL 8.5 9.4 8.9  Hemoglobin 12.0 - 15.0 g/dL 7.4(L) 8.0(L) 8.5(L)  Hematocrit 36.0 - 46.0 % 23.1(L) 26.0(L) 26.7(L)  Platelets 150 - 400 K/uL 415(H) 359 353    RADS: n/a Assessment:   Recent hospitalization for prolonged ileus that seem to be resolving, but presented with acute recurrence of intense abdominal pain yesterday.  Since then, patient reports no change in pain, but she was no longer acting in any discomfort during today's exam, and review of MAR did not note any additional requirements for narcotics.  Unsure of the persistent nature of this pain that is reported, and the discrepancy between what is reported and what is seen on exam.  Labs otherwise remained relatively stable, with  chronically low levels of hemoglobin.  Recommend continued to observe for now, and see if any significant changes in symptomatology.  Surgery will continue to follow.

## 2019-10-09 NOTE — Progress Notes (Signed)
  Subjective: 2 Days Post-Op Procedure(s) (LRB): OPEN REDUCTION INTERNAL FIXATION (ORIF) PERIPROSTHETIC FRACTURE (Right)  Patient in room with service dog and female companion. Patient reports pain as significant but states that she doesn't want to take pain medications unless she is going to sleep due to fear of how that will affect her ambulation.  Plan is to go Home after hospital stay. Negative for chest pain and shortness of breath Fever: no Gastrointestinal: negative for nausea and vomiting.   Patient has had a bowel movement.  Objective: Vital signs in last 24 hours: Temp:  [98.2 F (36.8 C)-98.9 F (37.2 C)] 98.2 F (36.8 C) (06/05 0743) Pulse Rate:  [60-89] 64 (06/05 0813) Resp:  [18-20] 18 (06/05 0743) BP: (158-175)/(63-85) 175/70 (06/05 0813) SpO2:  [97 %-100 %] 100 % (06/05 0813) Weight:  [72.1 kg] 72.1 kg (06/05 0521)  Intake/Output from previous day:  Intake/Output Summary (Last 24 hours) at 10/09/2019 1458 Last data filed at 10/09/2019 1350 Gross per 24 hour  Intake 480 ml  Output 1200 ml  Net -720 ml    Intake/Output this shift: Total I/O In: 480 [P.O.:480] Out: -   Labs: Recent Labs    10/07/19 0531 10/08/19 0410 10/09/19 0552  HGB 8.5* 8.0* 7.4*   Recent Labs    10/08/19 0410 10/09/19 0552  WBC 9.4 8.5  RBC 3.60* 3.31*  HCT 26.0* 23.1*  PLT 359 415*   Recent Labs    10/08/19 0410 10/09/19 0552  NA 135 134*  K 4.9 3.8  CL 113* 110  CO2 15* 18*  BUN 14 10  CREATININE 0.68 0.52  GLUCOSE 134* 80  CALCIUM 7.6* 7.6*   No results for input(s): LABPT, INR in the last 72 hours.   EXAM General - Patient is Alert, Appropriate and Oriented Extremity - Neurovascular intact   Shoulder sling in place  Dressing/Incision -clean, dry, no drainage Motor Function - intact, moving fingers well on exam. Able to make all signs.    Assessment/Plan: 2 Days Post-Op Procedure(s) (LRB): OPEN REDUCTION INTERNAL FIXATION (ORIF) PERIPROSTHETIC FRACTURE  (Right) Principal Problem:   Ileus (HCC) Active Problems:   Pre-op evaluation   NSVT (nonsustained ventricular tachycardia) (HCC)   Dislocation of right shoulder joint   Hypokalemia   Hypomagnesemia  Estimated body mass index is 28.17 kg/m as calculated from the following:   Height as of this encounter: 5\' 3"  (1.6 m).   Weight as of this encounter: 72.1 kg. Advance diet Continue with post-op course s/p right total shoulder replacement but will progress slowly due to recent dislocation and subsequent repeat subscapularis repair. Avoid external rotation.  HHPT upon discharge.    DVT Prophylaxis - Lovenox, Ted hose and foot pumps NonWB to right arm.   Cassell Smiles, PA-C [facility] 10/09/2019, 2:58 PM

## 2019-10-09 NOTE — Progress Notes (Signed)
Progress Note  Patient Name: Sydney Evans Date of Encounter: 10/09/2019  [facility] Cardiologist: No primary care provider on file. Dr. Garen Lah  Subjective   No chest pain or palpitations.  She reports right shoulder pain.  Inpatient Medications    Scheduled Meds: . Chlorhexidine Gluconate Cloth  6 each Topical Daily  . citalopram  40 mg Oral Daily  . docusate sodium  100 mg Oral BID  . enoxaparin (LOVENOX) injection  40 mg Subcutaneous Q24H  . lamoTRIgine  200 mg Oral Daily  . latanoprost  1 drop Both Eyes QHS  . metoprolol tartrate  12.5 mg Oral BID  . pantoprazole (PROTONIX) IV  40 mg Intravenous Q12H   Continuous Infusions: . magnesium sulfate bolus IVPB 4 g (10/09/19 1015)   PRN Meds: bisacodyl, diphenhydrAMINE, diphenhydrAMINE, fentaNYL (SUBLIMAZE) injection, fluticasone, hydrALAZINE, magnesium hydroxide, metoCLOPramide **OR** metoCLOPramide (REGLAN) injection, ondansetron **OR** ondansetron (ZOFRAN) IV, polyvinyl alcohol, simethicone, sodium phosphate, tiZANidine, traMADol, traZODone   Vital Signs    Vitals:   10/08/19 2031 10/09/19 0521 10/09/19 0743 10/09/19 0813  BP: (!) 158/76 (!) 161/85 (!) 168/63 (!) 175/70  Pulse: 73 67 60 64  Resp:  20 18   Temp: 98.3 F (36.8 C) 98.2 F (36.8 C) 98.2 F (36.8 C)   TempSrc: Oral Oral    SpO2: 100% 99% 97% 100%  Weight:  72.1 kg    Height:        Intake/Output Summary (Last 24 hours) at 10/09/2019 1204 Last data filed at 10/09/2019 1000 Gross per 24 hour  Intake 240 ml  Output 1900 ml  Net -1660 ml   Last 3 Weights 10/09/2019 10/08/2019 10/07/2019  Weight (lbs) 159 lb 160 lb 12.8 oz 156 lb  Weight (kg) 72.122 kg 72.938 kg 70.761 kg      Telemetry    Sinus bradycardia, occasional PACs, no nonsustained ventricular tachycardia recently- Personally Reviewed  ECG    Normal sinus rhythm on May 29- Personally Reviewed  Physical Exam   GEN: No acute distress.   Neck: No JVD Cardiac:  Bradycardic, no  murmurs, rubs, or gallops.  Respiratory: Clear to auscultation bilaterally. GI: Soft, nontender, non-distended  MS: No edema; No deformity.  Right shoulder and arm in brace Neuro:  Nonfocal  Psych: Normal affect   Labs    High Sensitivity Troponin:   Recent Labs  Lab 10/02/19 2145 10/03/19 0100  TROPONINIHS 76* 69*      Chemistry Recent Labs  Lab 10/02/19 2145 10/03/19 0150 10/05/19 0630 10/05/19 1410 10/07/19 0531 10/07/19 1106 10/07/19 1336 10/08/19 0410 10/09/19 0552  NA 131*   < > 134*   < > 136  --   --  135 134*  K 2.2*   < > 3.0*   < > 3.9   < > 3.7 4.9 3.8  CL 106   < > 108   < > 113*  --   --  113* 110  CO2 14*   < > 16*   < > 13*  --   --  15* 18*  GLUCOSE 167*   < > 88   < > 76  --   --  134* 80  BUN 15   < > 18   < > 15  --   --  14 10  CREATININE 1.37*   < > 0.72   < > 0.65  --   --  0.68 0.52  CALCIUM 7.1*   < > 7.3*   < >  7.7*  --   --  7.6* 7.6*  PROT 5.4*  --  4.7*  --   --   --   --   --   --   ALBUMIN 2.6*  --  2.1*  --   --   --   --   --   --   AST 78*  --  29  --   --   --   --   --   --   ALT 22  --  14  --   --   --   --   --   --   ALKPHOS 66  --  52  --   --   --   --   --   --   BILITOT 0.7  --  0.6  --   --   --   --   --   --   GFRNONAA 40*   < > >60   < > >60  --   --  >60 >60  GFRAA 46*   < > >60   < > >60  --   --  >60 >60  ANIONGAP 11   < > 10   < > 10  --   --  7 6   < > = values in this interval not displayed.     Hematology Recent Labs  Lab 10/07/19 0531 10/08/19 0410 10/09/19 0552  WBC 8.9 9.4 8.5  RBC 3.82* 3.60* 3.31*  HGB 8.5* 8.0* 7.4*  HCT 26.7* 26.0* 23.1*  MCV 69.9* 72.2* 69.8*  MCH 22.3* 22.2* 22.4*  MCHC 31.8 30.8 32.0  RDW 17.9* 17.9* 18.2*  PLT 353 359 415*    BNPNo results for input(s): BNP, PROBNP in the last 168 hours.   DDimer No results for input(s): DDIMER in the last 168 hours.   Radiology    DG Shoulder Right Port  Result Date: 10/07/2019 CLINICAL DATA:  Postop right shoulder arthroplasty  EXAM: PORTABLE RIGHT SHOULDER COMPARISON:  None. FINDINGS: Changes of right shoulder replacement. Normal alignment. No visible complicating feature. IMPRESSION: Right shoulder replacement.  No visible complicating feature. Electronically Signed   By: Rolm Baptise M.D.   On: 10/07/2019 18:20   DG Abd Portable 1V  Result Date: 10/08/2019 CLINICAL DATA:  Left upper quadrant abdominal pain EXAM: PORTABLE ABDOMEN - 1 VIEW COMPARISON:  10/06/2019 FINDINGS: Gas-filled loops of small and large bowel. Degree of distention is not substantially changed. Limited evaluation for free air on this study. IMPRESSION: Gas-filled loops of small and large bowel, which may reflect ileus. The degree of distention is not substantially changed since the prior study. Electronically Signed   By: Macy Mis M.D.   On: 10/08/2019 16:12   [place] OR NERVE BLOCK-IMAGE ONLY [facility])  Result Date: 10/07/2019 There is no interpretation for this exam.  This order is for images obtained during a surgical procedure.  Please See "Surgeries" Tab for more information regarding the procedure.    Cardiac Studies   Normal LV function by echocardiogram  Patient Profile     69 y.o. female who had paroxysmal nonsustained ventricular tachycardia in the setting of some electrolyte abnormality  Assessment & Plan    Back in sinus rhythm with PACs.  Continue low-dose metoprolol and monitoring electrolytes.  No further cardiac work-up needed at this time.  Anemia has worsened this morning.  Likely related to surgery.  Followed by primary team.  Please call with questions.  [facility] will sign off.   Medication Recommendations: Metoprolol as above Other recommendations (labs, testing, etc):  *Continue monitoring anemia and electrolytes Follow up as an outpatient: Depending on hospital course,   For questions or updates, please contact [place] Please consult [email or web address] for contact info under        Signed, Larae Grooms, MD  10/09/2019, 12:04 PM

## 2019-10-10 LAB — BASIC METABOLIC PANEL
Anion gap: 5 (ref 5–15)
BUN: 7 mg/dL — ABNORMAL LOW (ref 8–23)
CO2: 19 mmol/L — ABNORMAL LOW (ref 22–32)
Calcium: 7.7 mg/dL — ABNORMAL LOW (ref 8.9–10.3)
Chloride: 108 mmol/L (ref 98–111)
Creatinine, Ser: 0.63 mg/dL (ref 0.44–1.00)
GFR calc Af Amer: >60 mL/min (ref >60)
GFR calc non Af Amer: >60 mL/min (ref >60)
Glucose, Bld: 89 mg/dL (ref 70–99)
Potassium: 4.3 mmol/L (ref 3.5–5.1)
Sodium: 132 mmol/L — ABNORMAL LOW (ref 135–145)

## 2019-10-10 LAB — CBC
HCT: 24.9 % — ABNORMAL LOW (ref 36.0–46.0)
Hemoglobin: 7.6 g/dL — ABNORMAL LOW (ref 12.0–15.0)
MCH: 22.1 pg — ABNORMAL LOW (ref 26.0–34.0)
MCHC: 30.5 g/dL (ref 30.0–36.0)
MCV: 72.4 fL — ABNORMAL LOW (ref 80.0–100.0)
Platelets: 429 10*3/uL — ABNORMAL HIGH (ref 150–400)
RBC: 3.44 MIL/uL — ABNORMAL LOW (ref 3.87–5.11)
RDW: 18.6 % — ABNORMAL HIGH (ref 11.5–15.5)
WBC: 6.9 10*3/uL (ref 4.0–10.5)
nRBC: 0.3 % — ABNORMAL HIGH (ref 0.0–0.2)

## 2019-10-10 LAB — MAGNESIUM: Magnesium: 1.7 mg/dL (ref 1.7–2.4)

## 2019-10-10 MED ORDER — PANTOPRAZOLE SODIUM 40 MG PO TBEC
40.0000 mg | DELAYED_RELEASE_TABLET | Freq: Every day | ORAL | Status: DC
  Administered 2019-10-10 – 2019-10-11 (×2): 40 mg via ORAL
  Filled 2019-10-10 (×2): qty 1

## 2019-10-10 MED ORDER — MAGNESIUM SULFATE 2 GM/50ML IV SOLN
2.0000 g | Freq: Once | INTRAVENOUS | Status: AC
  Administered 2019-10-10: 2 g via INTRAVENOUS
  Filled 2019-10-10: qty 50

## 2019-10-10 NOTE — Progress Notes (Signed)
  Subjective: 3 Days Post-Op Procedure(s) (LRB): OPEN REDUCTION INTERNAL FIXATION (ORIF) PERIPROSTHETIC FRACTURE (Right) Patient reports pain as tolerable at this time.   Patient is well, and has had no acute complaints or problems Plan is to go Home after hospital stay. Negative for chest pain and shortness of breath Fever: no  Objective: Vital signs in last 24 hours: Temp:  [97.7 F (36.5 C)-98.9 F (37.2 C)] 97.8 F (36.6 C) (06/06 1202) Pulse Rate:  [47-70] 47 (06/06 1202) Resp:  [16-20] 19 (06/06 1202) BP: (141-177)/(60-83) 143/60 (06/06 1202) SpO2:  [98 %-100 %] 100 % (06/06 1202)  Intake/Output from previous day:  Intake/Output Summary (Last 24 hours) at 10/10/2019 1253 Last data filed at 10/09/2019 1350 Gross per 24 hour  Intake 240 ml  Output --  Net 240 ml    Intake/Output this shift: No intake/output data recorded.  Labs: Recent Labs    10/08/19 0410 10/09/19 0552 10/10/19 0541  HGB 8.0* 7.4* 7.6*   Recent Labs    10/09/19 0552 10/10/19 0541  WBC 8.5 6.9  RBC 3.31* 3.44*  HCT 23.1* 24.9*  PLT 415* 429*   Recent Labs    10/09/19 0552 10/10/19 0541  NA 134* 132*  K 3.8 4.3  CL 110 108  CO2 18* 19*  BUN 10 7*  CREATININE 0.52 0.63  GLUCOSE 80 89  CALCIUM 7.6* 7.7*   No results for input(s): LABPT, INR in the last 72 hours.   EXAM General - Patient is Alert, Appropriate and Oriented Extremity - Neurovascular intact able to actively flex and extend fingers, wrist, and elbow.  Dressing/Incision -clean, dry, no drainage  Assessment/Plan: 3 Days Post-Op Procedure(s) (LRB): OPEN REDUCTION INTERNAL FIXATION (ORIF) PERIPROSTHETIC FRACTURE (Right) Principal Problem:   Ileus (HCC) Active Problems:   Pre-op evaluation   NSVT (nonsustained ventricular tachycardia) (HCC)   Dislocation of right shoulder joint   Hypokalemia   Hypomagnesemia  Estimated body mass index is 28.17 kg/m as calculated from the following:   Height as of this  encounter: 5\' 3"  (1.6 m).   Weight as of this encounter: 72.1 kg. Advance diet Continue with post-op course s/p right total shoulder replacement but will progress slowly due to recent dislocation and subsequent repeat subscapularis repair. Avoid external rotation.  HHPT upon discharge. Patient clear to d/c from orthopedic standpoint.     DVT Prophylaxis - [place], PA-C [facility] 10/10/2019, 12:53 PM

## 2019-10-10 NOTE — Consult Note (Signed)
PHARMACY CONSULT NOTE - FOLLOW UP  Pharmacy Consult for Electrolyte Monitoring and Replacement   Recent Labs: Potassium (mmol/L)  Date Value  10/10/2019 4.3   Magnesium (mg/dL)  Date Value  10/10/2019 1.7   Calcium (mg/dL)  Date Value  10/10/2019 7.7 (L)   Albumin (g/dL)  Date Value  10/05/2019 2.1 (L)   Sodium (mmol/L)  Date Value  10/10/2019 132 (L)     Assessment: 69 year old admitted with diffuse ileus, and recent right total shoulder arthroplasty on 5/27.  Nausea and vomiting reported in chart, but seems to be improving.  She appears to be having liquid stool from rectal tube, and high output from NG tube.  Pharmacy has been consulted to replace potassium.  Goal of Therapy:  Potassium >4  Plan:  6/6 No potassium replacement warranted at this time.  Would recommend replacing Magnesium with Mag 2gm IV x 1 dose.   Pharmacy will continue to follow and replace potassium as needed.   Pernell Dupre, PharmD, BCPS Clinical Pharmacist 10/10/2019 8:51 AM

## 2019-10-10 NOTE — Progress Notes (Signed)
Physical Therapy Treatment Patient Details Name: Sydney Evans MRN: [id number] DOB: 09/20/1950 Today's Date: 10/10/2019    History of Present Illness presented to ER secondary to hypotension, abdominal pain, nausea/vomiting, passed out x1 at home; admitted for management of acute ileus (managed with NPO, NGT) and noted anterior dislocation of R shoulder (s/p surgical relocation and repair of subscapularis disruption 6/3).  Of note, initial R TSR 09/30/19.    PT Comments    Pt in bed, ready to walk.  Pure wick not seated right so pt saturated with urine upon sitting.  Gown changed with care and education regarding techniques to protect R shoulder followed and taught.  Voiced understanding.  She is able to stand and complete lap around unit with no AD.  She has some imbalances but is able to recover on her own with no intervention.  Stated she does not think she needs SPC and opts to not use it this session.  Encouraged her to consider SPC while in community or outside especially if unattended and she voiced understanding.  To BR on return to void and medium loose BM.   Follow Up Recommendations  Home health PT     Equipment Recommendations  Cane    Recommendations for Other Services       Precautions / Restrictions Precautions Precautions: Shoulder;Fall Type of Shoulder Precautions: total Shoulder Interventions: Shoulder sling/immobilizer;Off for dressing/bathing/exercises Precaution Booklet Issued: No Restrictions Weight Bearing Restrictions: Yes RUE Weight Bearing: Non weight bearing    Mobility  Bed Mobility Overal bed mobility: Modified Independent                Transfers Overall transfer level: Modified independent   Transfers: Sit to/from Stand Sit to Stand: Supervision;Modified independent (Device/Increase time)            Ambulation/Gait Ambulation/Gait assistance: Min guard;Supervision Gait Distance (Feet): 160 Feet Assistive device: None   Gait  velocity: decreased       Stairs             Wheelchair Mobility    Modified Rankin (Stroke Patients Only)       Balance Overall balance assessment: Modified Independent Sitting-balance support: Feet supported;No upper extremity supported Sitting balance-Leahy Scale: Good Sitting balance - Comments: no unsteadiness noted with static sitting   Standing balance support: No upper extremity supported;During functional activity Standing balance-Leahy Scale: Good                              Cognition Arousal/Alertness: Awake/alert Behavior During Therapy: WFL for tasks assessed/performed Overall Cognitive Status: Within Functional Limits for tasks assessed                                 General Comments: Pt presents A and O x4, aware of the situation and why she is here      Exercises      General Comments        Pertinent Vitals/Pain Pain Assessment: Faces Faces Pain Scale: Hurts a little bit Pain Location: shoulder Pain Descriptors / Indicators: Sore Pain Intervention(s): Limited activity within patient's tolerance;Monitored during session;Repositioned    Home Living                      Prior Function            PT Goals (current goals can now be found in  the care plan section) Progress towards PT goals: Progressing toward goals    Frequency    7X/week      PT Plan Current plan remains appropriate    Co-evaluation              AM-PAC PT "6 Clicks" Mobility   Outcome Measure  Help needed turning from your back to your side while in a flat bed without using bedrails?: None Help needed moving from lying on your back to sitting on the side of a flat bed without using bedrails?: None Help needed moving to and from a bed to a chair (including a wheelchair)?: None Help needed standing up from a chair using your arms (e.g., wheelchair or bedside chair)?: None Help needed to walk in hospital room?: A  Little Help needed climbing 3-5 steps with a railing? : A Little 6 Click Score: 22    End of Session Equipment Utilized During Treatment: Gait belt Activity Tolerance: Patient tolerated treatment well Patient left: with call bell/phone within reach;in chair;with chair alarm set Nurse Communication: Mobility status Pain - Right/Left: Right Pain - part of body: Shoulder     Time: 4888-9169 PT Time Calculation (min) (ACUTE ONLY): 23 min  Charges:  $Gait Training: 8-22 mins $Therapeutic Activity: 8-22 mins                    Chesley Noon, PTA 10/10/19, 9:36 AM

## 2019-10-10 NOTE — Progress Notes (Addendum)
PROGRESS NOTE    Sydney Evans   QZE:[id number]  DOB: 1951/03/19  PCP: Patient, No Pcp Per    DOA: 10/02/2019 LOS: 7   Brief Narrative   Sydney Evans is a 69 y.o. female admitted overnight to the hospitalist team with diffuse ileus.  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 Roland Rack.  Patient reports that since she got home, she started having abdominal distention, which progressed into nausea and vomiting.  In the ED, labs showed AKI with a creatinine of 1.37 from prior of 0.63, severe hypokalemia with potassium of 2.2, lactic acidosis of 4.4, and WBC of 14.  CT abdomen/pelvis showed generalized ileus with no transition point or evidence of bowel obstruction. The patient was empirically given IV cefepime and vancomycin and admitted for further management.  General surgery, GI, and orthopedics are following.     Assessment & Plan   Principal Problem:   Ileus ([place]) Active Problems:   NSVT (nonsustained ventricular tachycardia) (HCC)   Dislocation of right shoulder joint   Hypokalemia   Hypomagnesemia   Pre-op evaluation  Recurrent Left Lower Quadrant Abdominal Pain - improved.  Afternoon of 6/4 after lunch, first time eating normal diet.  Stat abdominal xray not impressive for recurrent obstruction.  Surgery re-consulted.  IV Fentanyl PRN.  Revert diet to full liquids.  Trial of simethicone seems to help.    Ileus - POA.  Resolved.  In setting of shoulder surgery 5/27.  General surgery was consulted.  NG tube removed on 6/2.  Full liquid diet for now given pain recurrence.  As needed pain meds and antiemetics. Empiric antibiotics on admission were stopped and no signs of infection.   Sepsis - documented on admission, ruled out as findings due to ileus.  No source of infection was identified, antibiotics were stopped and patient clinically improved.  Right shoulder dislocation - s/p RC repair and joint reduction on 6/3 with Dr. Roland Rack.  Patient had  right shoulder arthroplasty on 5/27.  Patient had a fall at home after discharge resulting in dislocation and tearing of rotator cuff.   Tylenol, Tramadol PO, Fentanyl IV as needed for pain control.  Hypertension -likely exacerbated due to pain.  History of chronic hypertension for which she takes lisinopril 40 mg daily.  Hold lisinopril.   IV hydralazine as needed.  NSVT (nonsustained ventricular tachycardia) -patient had 11 beat run of V. tach on telemetry in setting of hypokalemia.  Continue telemetry monitoring.  Replace potassium and magnesium, target K>4.0, Mg>2.0.  Cardiology consulted.  Metoprolol low dose started.  Hypokalemia -persistent, acute on chronic.  Patient has long history of hypokalemia which she manages at home by drinking Gatorade.  Have been aggressively replacing throughout admission.  Daily BMP's.  Hypomagnesemia -replacing with IV.  Monitor daily and replace for goal Mg>2.0.  Bradycardia -resolved.  Monitor.  Bipolar disorder -no acute issues.  Continue Lamictal.  GERD -continue PPI.  DVT prophylaxis: SCD's  Diet:  Diet Orders (From admission, onward)     Start     Ordered   10/10/19 0833  DIET SOFT Room service appropriate? Yes; Fluid consistency: Thin  Diet effective now    Question Answer Comment  Room service appropriate? Yes   Fluid consistency: Thin      10/10/19 0832              Code Status: DNR    Subjective 10/10/19    Patient seen up in chair this morning.  No acute  events reported.  She reports feeling well.  Shoulder hurts but she continues to decline pain medicine due to it making her unsteady. Says she will take it before bed only at home.  No acute complaints.  Hoping to go home soon.  Disposition Plan & Communication   Status is: Inpatient  Remains inpatient appropriate because:Inpatient level of care appropriate due to severity of illness .  Discharge pending clearance by orthopedics and stability of electrolytes.  Dispo: The  patient is from: Home              Anticipated d/c is to: Home              Anticipated d/c date is: 2 days              Patient currently is not medically stable to d/c.   Family Communication: none at bedside during encounter, will attempt to call   Consults, Procedures, Significant Events   Consultants:  Orthopedics Cardiology General surgery  Procedures:  None  Antimicrobials:  Vancomycin 5/29 >> 6/1 Cefepime 5/29 >> 6/2 Flagyl 5/30 >> 6/2   Objective   Vitals:   10/10/19 0431 10/10/19 0731 10/10/19 1202 10/10/19 1534  BP: (!) 174/62 (!) 161/71 (!) 143/60 125/65  Pulse: (!) 54 65 (!) 47 (!) 57  Resp: 20 18 19 18   Temp: 97.7 F (36.5 C) 98 F (36.7 C) 97.8 F (36.6 C) 98.3 F (36.8 C)  TempSrc: Oral Oral Oral Oral  SpO2: 100% 100% 100% 99%  Weight:      Height:        Intake/Output Summary (Last 24 hours) at 10/10/2019 1820 Last data filed at 10/10/2019 1350 Gross per 24 hour  Intake 240 ml  Output --  Net 240 ml   Filed Weights   10/07/19 0408 10/08/19 0505 10/09/19 0521  Weight: 70.8 kg 72.9 kg 72.1 kg    Physical Exam:  General exam: awake, alert, no acute distress Respiratory system: CTAB, normal respiratory effort, no wheezes or rhonchi Cardiovascular system: normal S1/S2, RRR   Gastrointestinal system: minimal LLQ tenderness, soft, ND, +bowel sounds. Central nervous system: A&O x4. no gross focal neurologic deficits, normal speech Extremities: RUE in immobilizing sling, distal pulse and sensation intact, no cyanosis, normal tone Psychiatry: normal mood, congruent affect, judgement and insight appear normal  Labs   Data Reviewed: I have personally reviewed following labs and imaging studies  CBC: Recent Labs  Lab 10/06/19 0602 10/07/19 0531 10/08/19 0410 10/09/19 0552 10/10/19 0541  WBC 8.8 8.9 9.4 8.5 6.9  HGB 8.0* 8.5* 8.0* 7.4* 7.6*  HCT 24.5* 26.7* 26.0* 23.1* 24.9*  MCV 69.0* 69.9* 72.2* 69.8* 72.4*  PLT 341 353 359 415* 429*    Basic Metabolic Panel: Recent Labs  Lab 10/06/19 1403 10/06/19 2004 10/07/19 0531 10/07/19 0531 10/07/19 1106 10/07/19 1336 10/08/19 0410 10/09/19 0552 10/10/19 0541  NA 133*  --  136  --   --   --  135 134* 132*  K 2.9*   < > 3.9   < > 3.5 3.7 4.9 3.8 4.3  CL 108  --  113*  --   --   --  113* 110 108  CO2 14*  --  13*  --   --   --  15* 18* 19*  GLUCOSE 76  --  76  --   --   --  134* 80 89  BUN 15  --  15  --   --   --  14 10 7*  CREATININE 0.62  --  0.65  --   --   --  0.68 0.52 0.63  CALCIUM 7.4*  --  7.7*  --   --   --  7.6* 7.6* 7.7*  MG 2.6*  --  2.0  --  3.1*  --  2.1 1.5* 1.7   < > = values in this interval not displayed.   GFR: Estimated Creatinine Clearance: 65 mL/min (by C-G formula based on SCr of 0.63 mg/dL). Liver Function Tests: Recent Labs  Lab 10/05/19 0630  AST 29  ALT 14  ALKPHOS 52  BILITOT 0.6  PROT 4.7*  ALBUMIN 2.1*   No results for input(s): LIPASE, AMYLASE in the last 168 hours. No results for input(s): AMMONIA in the last 168 hours. Coagulation Profile: No results for input(s): INR, PROTIME in the last 168 hours. Cardiac Enzymes: No results for input(s): CKTOTAL, CKMB, CKMBINDEX, TROPONINI in the last 168 hours. BNP (last 3 results) No results for input(s): PROBNP in the last 8760 hours. HbA1C: No results for input(s): HGBA1C in the last 72 hours. CBG: Recent Labs  Lab 10/04/19 2035  GLUCAP 89   Lipid Profile: No results for input(s): CHOL, HDL, LDLCALC, TRIG, CHOLHDL, LDLDIRECT in the last 72 hours. Thyroid Function Tests: No results for input(s): TSH, T4TOTAL, FREET4, T3FREE, THYROIDAB in the last 72 hours. Anemia Panel: No results for input(s): VITAMINB12, FOLATE, FERRITIN, TIBC, IRON, RETICCTPCT in the last 72 hours. Sepsis Labs: Recent Labs  Lab 10/04/19 0602 10/04/19 0848  LATICACIDVEN 1.6 1.6    Recent Results (from the past 240 hour(s))  Blood culture (routine x 2)     Status: None   Collection Time: 10/02/19   9:45 PM   Specimen: BLOOD  Result Value Ref Range Status   Specimen Description BLOOD LEFT ANTECUBITAL  Final   Special Requests   Final    BOTTLES DRAWN AEROBIC AND ANAEROBIC Blood Culture results may not be optimal due to an inadequate volume of blood received in culture bottles   Culture   Final    NO GROWTH 5 DAYS Performed at [facility], [address], [place], [place] [address]    Report Status 10/07/2019 FINAL  Final  Blood culture (routine x 2)     Status: None   Collection Time: 10/02/19  9:48 PM   Specimen: BLOOD  Result Value Ref Range Status   Specimen Description BLOOD BLOOD LEFT ARM  Final   Special Requests   Final    BOTTLES DRAWN AEROBIC AND ANAEROBIC Blood Culture adequate volume   Culture   Final    NO GROWTH 5 DAYS Performed at [facility], [address], [place], [place] [address]    Report Status 10/07/2019 FINAL  Final  SARS Coronavirus 2 by RT PCR (hospital order, performed in [facility] hospital lab) Nasopharyngeal Nasopharyngeal Swab     Status: None   Collection Time: 10/02/19 11:48 PM   Specimen: Nasopharyngeal Swab  Result Value Ref Range Status   SARS Coronavirus 2 NEGATIVE NEGATIVE Final    Comment: (NOTE) SARS-CoV-2 target nucleic acids are NOT DETECTED. The SARS-CoV-2 RNA is generally detectable in upper and lower respiratory specimens during the acute phase of infection. The lowest concentration of SARS-CoV-2 viral copies this assay can detect is 250 copies / mL. A negative result does not preclude SARS-CoV-2 infection and should not be used as the sole basis for treatment or other patient management decisions.  A negative result  may occur with improper specimen collection / handling, submission of specimen other than nasopharyngeal swab, presence of viral mutation(s) within the areas targeted by this assay, and inadequate number of viral copies (<250 copies / mL). A negative result must be combined with  clinical observations, patient history, and epidemiological information. Fact Sheet for Patients:   StrictlyIdeas.no Fact Sheet for Healthcare Providers: BankingDealers.co.za This test is not yet approved or cleared  by the [place] FDA and has been authorized for detection and/or diagnosis of SARS-CoV-2 by FDA under an Emergency Use Authorization (EUA).  This EUA will remain in effect (meaning this test can be used) for the duration of the COVID-19 declaration under Section 564(b)(1) of the Act, 21 U.S.C. section 360bbb-3(b)(1), unless the authorization is terminated or revoked sooner. Performed at [facility], [place], [place] [address]   C Difficile Quick Screen w PCR reflex     Status: None   Collection Time: 10/03/19  4:08 AM   Specimen: STOOL  Result Value Ref Range Status   C Diff antigen NEGATIVE NEGATIVE Final   C Diff toxin NEGATIVE NEGATIVE Final   C Diff interpretation No C. difficile detected.  Final    Comment: Performed at [facility], [address] [place], [place] [address]  Gastrointestinal Panel by PCR , Stool     Status: None   Collection Time: 10/03/19  4:09 AM   Specimen: STOOL  Result Value Ref Range Status   Campylobacter species NOT DETECTED NOT DETECTED Final   Plesimonas shigelloides NOT DETECTED NOT DETECTED Final   Salmonella species NOT DETECTED NOT DETECTED Final   Yersinia enterocolitica NOT DETECTED NOT DETECTED Final   Vibrio species NOT DETECTED NOT DETECTED Final   Vibrio cholerae NOT DETECTED NOT DETECTED Final   Enteroaggregative E coli (EAEC) NOT DETECTED NOT DETECTED Final   Enteropathogenic E coli (EPEC) NOT DETECTED NOT DETECTED Final   Enterotoxigenic E coli (ETEC) NOT DETECTED NOT DETECTED Final   Shiga like toxin producing E coli (STEC) NOT DETECTED NOT DETECTED Final   Shigella/Enteroinvasive E coli (EIEC) NOT DETECTED NOT DETECTED Final    Cryptosporidium NOT DETECTED NOT DETECTED Final   Cyclospora cayetanensis NOT DETECTED NOT DETECTED Final   Entamoeba histolytica NOT DETECTED NOT DETECTED Final   Giardia lamblia NOT DETECTED NOT DETECTED Final   Adenovirus F40/41 NOT DETECTED NOT DETECTED Final   Astrovirus NOT DETECTED NOT DETECTED Final   Norovirus GI/GII NOT DETECTED NOT DETECTED Final   Rotavirus A NOT DETECTED NOT DETECTED Final   Sapovirus (I, II, IV, and V) NOT DETECTED NOT DETECTED Final    Comment: Performed at [facility], [address] [place], [place] [address]  C Difficile Quick Screen w PCR reflex     Status: None   Collection Time: 10/03/19  4:14 AM   Specimen: Stool  Result Value Ref Range Status   C Diff antigen NEGATIVE NEGATIVE Final   C Diff toxin NEGATIVE NEGATIVE Final   C Diff interpretation No C. difficile detected.  Final    Comment: Performed at [facility], [address], [place], [place] [address]      Imaging Studies   No results found.   Medications   Scheduled Meds:  Chlorhexidine Gluconate Cloth  6 each Topical Daily   citalopram  40 mg Oral Daily   docusate sodium  100 mg Oral BID   enoxaparin (LOVENOX) injection  40 mg Subcutaneous Q24H   lamoTRIgine  200  mg Oral Daily   latanoprost  1 drop Both Eyes QHS   metoprolol tartrate  12.5 mg Oral BID   pantoprazole  40 mg Oral Daily   Continuous Infusions:       LOS: 7 days    Time spent: 25 minutes    Ezekiel Slocumb, DO [facility]  10/10/2019, 6:20 PM    If 7PM-7AM, please contact night-coverage. How to contact the [facility] Attending or Consulting provider [place] or covering provider during after hours [place], for this patient?    Check the care team in [facility] and look for a) attending/consulting [facility] provider listed and b) the [facility] team listed Log into [email or web address] and use [place] universal password to access. If you do not have the password, please contact the hospital  operator. Locate the [facility] provider you are looking for under [facility] and page to a number that you can be directly reached. If you still have difficulty reaching the provider, please page the [facility] (Director on Call) for the Hospitalists listed on amion for assistance.

## 2019-10-11 DIAGNOSIS — Z419 Encounter for procedure for purposes other than remedying health state, unspecified: Secondary | ICD-10-CM

## 2019-10-11 DIAGNOSIS — R579 Shock, unspecified: Secondary | ICD-10-CM

## 2019-10-11 DIAGNOSIS — R1084 Generalized abdominal pain: Secondary | ICD-10-CM

## 2019-10-11 LAB — CBC
HCT: 23.7 % — ABNORMAL LOW (ref 36.0–46.0)
Hemoglobin: 7.3 g/dL — ABNORMAL LOW (ref 12.0–15.0)
MCH: 22.5 pg — ABNORMAL LOW (ref 26.0–34.0)
MCHC: 30.8 g/dL (ref 30.0–36.0)
MCV: 72.9 fL — ABNORMAL LOW (ref 80.0–100.0)
Platelets: 481 10*3/uL — ABNORMAL HIGH (ref 150–400)
RBC: 3.25 MIL/uL — ABNORMAL LOW (ref 3.87–5.11)
RDW: 18.8 % — ABNORMAL HIGH (ref 11.5–15.5)
WBC: 6.9 10*3/uL (ref 4.0–10.5)
nRBC: 0 % (ref 0.0–0.2)

## 2019-10-11 LAB — MAGNESIUM: Magnesium: 1.5 mg/dL — ABNORMAL LOW (ref 1.7–2.4)

## 2019-10-11 LAB — BASIC METABOLIC PANEL
Anion gap: 7 (ref 5–15)
BUN: 6 mg/dL — ABNORMAL LOW (ref 8–23)
CO2: 20 mmol/L — ABNORMAL LOW (ref 22–32)
Calcium: 7.8 mg/dL — ABNORMAL LOW (ref 8.9–10.3)
Chloride: 108 mmol/L (ref 98–111)
Creatinine, Ser: 0.62 mg/dL (ref 0.44–1.00)
GFR calc Af Amer: >60 mL/min (ref >60)
GFR calc non Af Amer: >60 mL/min (ref >60)
Glucose, Bld: 89 mg/dL (ref 70–99)
Potassium: 3.9 mmol/L (ref 3.5–5.1)
Sodium: 135 mmol/L (ref 135–145)

## 2019-10-11 LAB — PREPARE RBC (CROSSMATCH)

## 2019-10-11 MED ORDER — POTASSIUM CHLORIDE CRYS ER 20 MEQ PO TBCR
20.0000 meq | EXTENDED_RELEASE_TABLET | Freq: Once | ORAL | Status: AC
  Administered 2019-10-11: 20 meq via ORAL
  Filled 2019-10-11: qty 1

## 2019-10-11 MED ORDER — SODIUM CHLORIDE 0.9% IV SOLUTION: Freq: Once | INTRAVENOUS | Status: AC

## 2019-10-11 MED ORDER — MAGNESIUM SULFATE 2 GM/50ML IV SOLN
2.0000 g | Freq: Once | INTRAVENOUS | Status: AC
  Administered 2019-10-11: 2 g via INTRAVENOUS
  Filled 2019-10-11: qty 50

## 2019-10-11 NOTE — Discharge Summary (Signed)
Sydney Evans    MR#:  [id number]  DATE OF BIRTH:  [birth year]  DATE OF ADMISSION:  10/02/2019   ADMITTING PHYSICIAN: Christel Mormon, MD  DATE OF DISCHARGE: 10/11/2019  4:29 PM  PRIMARY CARE PHYSICIAN: Patient, No Pcp Per   ADMISSION DIAGNOSIS:  Hypokalemia [E87.6] Ileus ([place]) [K56.7] Shock ([place]) [R57.9] Generalized abdominal pain [R10.84] Abnormal chest x-ray [R93.89] Hypotension [I95.9] DISCHARGE DIAGNOSIS:  Principal Problem:   Ileus ([place]) Active Problems:   Pre-op evaluation   NSVT (nonsustained ventricular tachycardia) (HCC)   Dislocation of right shoulder joint   Hypokalemia   Hypomagnesemia   Generalized abdominal pain   Shock ([place])   Elective surgery  SECONDARY DIAGNOSIS:   Past Medical History:  Diagnosis Date  . Anemia   . Anxiety   . Arthritis   . Barrett's esophagus   . Bipolar disorder ([place])   . 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 Blood clot in vein   . Cancer ([place])    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  . Carotid arterial disease ([place])   . Contracture of joint of upper arm   . Depression   . Elevated lipids   . GERD (gastroesophageal reflux disease)   . Hypertension   . Migraine   . Osteoporosis   . Poor balance   . Sinus congestion   . 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 TIA (transient ischemic attack)   . Vertigo    HOSPITAL COURSE:  Sydney Evans a 69 y.o.femaleadmitted overnight to the hospitalist team with diffuse ileus. 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 Roland Rack.Patient reports that since she got home, she started having abdominal distention, which progressed into nausea and vomiting. In the ED, labs showed AKI with a creatinine of 1.37 from prior of 0.63, severe hypokalemia with potassium of 2.2, lactic acidosis of 4.4, and WBC of 14. CT abdomen/pelvis showed generalized ileus with no transition point or evidence of bowel obstruction. The patient was empirically given IV  cefepime and vancomycin and admitted for further management.  General surgery, GI, and orthopedics were following.    Recurrent Left Lower Quadrant Abdominal Pain - POA likely due to ileus. Now improved. tolerating diet.   Ileus - POA.  Resolved.  In setting of shoulder surgery 5/27.  General surgery was consulted.  NG tube removed on 6/2. Tolerating diet, Empiric antibiotics on admission were stopped and no signs of infection. - instructed to avoid narcotics if at all possible. She is agreeable.   Sepsis - documented on admission, ruled out as findings due to ileus.  No source of infection was identified, antibiotics were stopped and patient clinically improved.  Right shoulder dislocation - s/p RC repair and joint reduction on 6/3 with Dr. Roland Rack.  Patient had right shoulder arthroplasty on 5/27.  Patient had a fall at home after discharge resulting in dislocation and tearing of rotator cuff.   Hypertension -likely exacerbated due to pain.  History of chronic hypertension for which she takes lisinopril 40 mg daily  NSVT (nonsustained ventricular tachycardia) -patient had 11 beat run of V. tach on telemetry in setting of hypokalemia.  Replaced potassium and magnesium, target K>4.0, Mg>2.0. outpt cardio f/up  Hypokalemia -persistent, acute on chronic.  Patient has long history of hypokalemia which she manages at home by drinking Gatorade.  Have been aggressively replacing throughout admission. Now resolved  Hypomagnesemia -replaced  Bradycardia -resolved.  Bipolar disorder -no acute issues.  Continue Lamictal.  GERD -continue PPI.  Anemia of chronic dz: Hb trending down, was 7.3, given 1 PRBC on 6/7, no active bleeding. DISCHARGE CONDITIONS:  stable CONSULTS OBTAINED:  Treatment Team:  Dereck Leep, MD Poggi, Marshall Cork, MD Benjamine Sprague, DO DRUG ALLERGIES:   Allergies  Allergen Reactions  . Hydroxyzine Hives and Rash  . Gabapentin     Dizzy and confusion  .  Hydrocodone Hives and Rash    "terrible scratching"  Make take with benadryl  . Ketorolac Rash     May take with benadryl  . Percocet [Oxycodone-Acetaminophen] Itching and Rash    May take with benadryl    . Toradol [Ketorolac Tromethamine] Rash    May take with benadryl   DISCHARGE MEDICATIONS:   Allergies as of 10/11/2019      Reactions   Hydroxyzine Hives, Rash   Gabapentin    Dizzy and confusion   Hydrocodone Hives, Rash   "terrible scratching"  Make take with benadryl   Ketorolac Rash    May take with benadryl   Percocet [oxycodone-acetaminophen] Itching, Rash   May take with benadryl     Toradol [ketorolac Tromethamine] Rash   May take with benadryl      Medication List    STOP taking these medications   oxyCODONE 5 MG immediate release tablet Commonly known as: Oxy IR/ROXICODONE     TAKE these medications   aspirin EC 325 MG tablet Take 1 tablet (325 mg total) by mouth daily.   citalopram 40 MG tablet Commonly known as: CELEXA Take 40 mg by mouth daily.   diclofenac Sodium 1 % Gel Commonly known as: VOLTAREN Apply 1 application topically daily as needed.   diphenhydrAMINE 25 MG tablet Commonly known as: BENADRYL Take 25 mg by mouth every 6 (six) hours as needed for itching.   fluticasone 50 MCG/ACT nasal spray Commonly known as: FLONASE Place 2 sprays into both nostrils daily as needed for allergies or rhinitis.   hydrochlorothiazide 25 MG tablet Commonly known as: HYDRODIURIL Take 25 mg by mouth daily.   IBU 800 MG tablet Generic drug: ibuprofen Take 800 mg by mouth daily as needed for mild pain or moderate pain.   lamoTRIgine 200 MG tablet Commonly known as: LAMICTAL Take 200 mg by mouth daily.   lidocaine 5 % Commonly known as: LIDODERM Place 1 patch onto the skin daily as needed (arthritis).   lisinopril 40 MG tablet Commonly known as: ZESTRIL Take 40 mg by mouth daily.   LUBRICANT EYE DROPS OP Place 1 drop into both eyes daily as  needed (Dry eye). Systane   omeprazole 40 MG capsule Commonly known as: PRILOSEC Take 40 mg by mouth daily.   tiZANidine 2 MG tablet Commonly known as: ZANAFLEX Take 2 mg by mouth 2 (two) times daily as needed for muscle spasms.   traMADol 50 MG tablet Commonly known as: ULTRAM Take 1 tablet (50 mg total) by mouth every 6 (six) hours.   Travoprost (BAK Free) 0.004 % Soln ophthalmic solution Commonly known as: TRAVATAN Place 1 drop into both eyes at bedtime.      DISCHARGE INSTRUCTIONS:  Avoid external rotation DIET:  Cardiac diet DISCHARGE CONDITION:  Stable ACTIVITY:  Activity as tolerated - Avoid external rotation OXYGEN:  Home Oxygen: No.  Oxygen Delivery: room air DISCHARGE LOCATION:  Home with HHPT   If you experience worsening of your admission symptoms, develop shortness of breath, life threatening emergency, suicidal  or homicidal thoughts you must seek medical attention immediately by calling 911 or calling your MD immediately  if symptoms less severe.  You Must read complete instructions/literature along with all the possible adverse reactions/side effects for all the Medicines you take and that have been prescribed to you. Take any new Medicines after you have completely understood and accpet all the possible adverse reactions/side effects.   Please note  You were cared for by a hospitalist during your hospital stay. If you have any questions about your discharge medications or the care you received while you were in the hospital after you are discharged, you can call the unit and asked to speak with the hospitalist on call if the hospitalist that took care of you is not available. Once you are discharged, your primary care physician will handle any further medical issues. Please note that NO REFILLS for any discharge medications will be authorized once you are discharged, as it is imperative that you return to your primary care physician (or establish a relationship  with a primary care physician if you do not have one) for your aftercare needs so that they can reassess your need for medications and monitor your lab values.    On the day of Discharge:  VITAL SIGNS:  Blood pressure (!) 131/55, pulse (!) 55, temperature 98.1 F (36.7 C), temperature source Oral, resp. rate 18, height 5\' 3"  (1.6 m), weight 71.7 kg, SpO2 98 %. PHYSICAL EXAMINATION:  GENERAL:  69 y.o.-year-old patient lying in the bed with no acute distress.  EYES: Pupils equal, round, reactive to light and accommodation. No scleral icterus. Extraocular muscles intact.  HEENT: Head atraumatic, normocephalic. Oropharynx and nasopharynx clear.  NECK:  Supple, no jugular venous distention. No thyroid enlargement, no tenderness.  LUNGS: Normal breath sounds bilaterally, no wheezing, rales,rhonchi or crepitation. No use of accessory muscles of respiration.  CARDIOVASCULAR: S1, S2 normal. No murmurs, rubs, or gallops.  ABDOMEN: Soft, non-tender, non-distended. Bowel sounds present. No organomegaly or mass.  EXTREMITIES: No pedal edema, cyanosis, or clubbing.  NEUROLOGIC: Cranial nerves II through XII are intact. Muscle strength 5/5 in all extremities. Sensation intact. Gait not checked.  PSYCHIATRIC: The patient is alert and oriented x 3.  SKIN: No obvious rash, lesion, or ulcer.  DATA REVIEW:   CBC Recent Labs  Lab 10/11/19 0333  WBC 6.9  HGB 7.3*  HCT 23.7*  PLT 481*    Chemistries  Recent Labs  Lab 10/05/19 0630 10/05/19 1410 10/11/19 0333  NA 134*   < > 135  K 3.0*   < > 3.9  CL 108   < > 108  CO2 16*   < > 20*  GLUCOSE 88   < > 89  BUN 18   < > 6*  CREATININE 0.72   < > 0.62  CALCIUM 7.3*   < > 7.8*  MG  --    < > 1.5*  AST 29  --   --   ALT 14  --   --   ALKPHOS 52  --   --   BILITOT 0.6  --   --    < > = values in this interval not displayed.     Outpatient follow-up Follow-up Information    [facility] Follow up.   Specialty: Home Health Services Why:  Physical Therapy, Occupational Therapy Contact information: [address] [place] [address] [phone]        Fausto Skillern, PA-C. Go  on 10/15/2019.   Specialty: Orthopedic Surgery Why: Appointment at 2:15 Contact information: [place] [address] [phone]        Kate Sable, MD. Go on 10/15/2019.   Specialties: Cardiology, Radiology Why: Appointment at 11:20am Contact information: [place] [address] [phone]            Management plans discussed with the patient, family and they are in agreement.  CODE STATUS: DNR   TOTAL TIME TAKING CARE OF THIS PATIENT: 45 minutes.    Max Sane M.D on 10/11/2019 at 8:50 PM  [facility]   CC: Primary care physician; Patient, No Pcp Per   Note: This dictation was prepared with Dragon dictation along with smaller phrase technology. Any transcriptional errors that result from this process are unintentional.

## 2019-10-11 NOTE — TOC Initial Note (Addendum)
Transition of Care [facility]) - Initial/Assessment Note    Patient Details  Name: Sydney Evans MRN: [id number] Date of Birth: [birth year]  Transition of [facility]) CM/SW Contact:    Eileen Stanford, LCSW Phone Number: 10/11/2019, 10:26 AM  Clinical Narrative:  CSW spoke with pt at bedside. Pt is alert and oriented. Pt states she is ready to go home. Pt is agreeable to [facility]. Pt does not have [place], pt has [employer]. Pt's [employer] Card was presented at bedside. CSW will send referral to [facility] agencies and see who can service pt.   Pt states she has a cane, potty chair, and wheelchair at home. No additional equipments needs at this time.              Expected Discharge Plan: [place] Barriers to Discharge: Continued Medical Work up   Patient Goals and CMS Choice Patient states their goals for this hospitalization and ongoing recovery are:: "I need to get home so i can get better"   Choice offered to / list presented to : Patient  Expected Discharge Plan and Services Expected Discharge Plan: [place] In-house Referral: Clinical Social Work   Post Acute Care Choice: [place] arrangements for the past 2 months: Single Family Home                           HH Arranged: PT, OT          Prior Living Arrangements/Services Living arrangements for the past 2 months: Single Family Home Lives with:: Self Patient language and need for interpreter reviewed:: Yes Do you feel safe going back to the place where you live?: Yes      Need for Family Participation in Patient Care: Yes (Comment) Care giver support system in place?: Yes (comment)   Criminal Activity/Legal Involvement Pertinent to Current Situation/Hospitalization: No - Comment as needed  Activities of Daily Living Home Assistive Devices/Equipment: Walker (specify type) ADL Screening (condition at time of admission) Patient's cognitive ability adequate to safely complete daily activities?:  Yes Is the patient deaf or have difficulty hearing?: No Does the patient have difficulty seeing, even when wearing glasses/contacts?: No Does the patient have difficulty concentrating, remembering, or making decisions?: Yes Patient able to express need for assistance with ADLs?: Yes Does the patient have difficulty dressing or bathing?: Yes Independently performs ADLs?: No Communication: Independent Dressing (OT): Dependent Is this a change from baseline?: Pre-admission baseline Grooming: Dependent Is this a change from baseline?: Pre-admission baseline Feeding: Needs assistance Is this a change from baseline?: Pre-admission baseline Bathing: Needs assistance Is this a change from baseline?: Pre-admission baseline Toileting: Needs assistance Is this a change from baseline?: Pre-admission baseline In/Out Bed: Needs assistance Is this a change from baseline?: Pre-admission baseline Walks in Home: Needs assistance Is this a change from baseline?: Pre-admission baseline Does the patient have difficulty walking or climbing stairs?: No Weakness of Legs: None Weakness of Arms/Hands: Right  Permission Sought/Granted Permission sought to share information with : Family Supports    Share Information with NAME: [name]     Permission granted to share info w Relationship: neice     Emotional Assessment Appearance:: Appears stated age Attitude/Demeanor/Rapport: Engaged Affect (typically observed): Accepting, Appropriate Orientation: : Oriented to Situation, Oriented to  Time, Oriented to Place, Oriented to Self Alcohol / Substance Use: Not Applicable Psych Involvement: No (comment)  Admission diagnosis:  Hypokalemia [E87.6] Ileus ([place]) [K56.7] Shock (  [place]) [R57.9] Generalized abdominal pain [R10.84] Abnormal chest x-ray [R93.89] Hypotension [I95.9] Patient Active Problem List   Diagnosis Date Noted  . Hypokalemia 10/07/2019  . Hypomagnesemia 10/07/2019  . Dislocation of right  shoulder joint 10/06/2019  . Pre-op evaluation   . NSVT (nonsustained ventricular tachycardia) ([place])   . Ileus ([place]) 10/03/2019  . 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 Post-concussion headache 01/20/2018  . Iron deficiency anemia 10/05/2017  . Carotid stenosis 11/17/2016  . GERD (gastroesophageal reflux disease) 11/17/2016  . Essential hypertension 11/17/2016  . Hyperlipidemia 11/17/2016  . 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 Rotator cuff tendinitis, left 05/20/2016  . History of TIAs 07/17/2015  . 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 TIA (transient ischemic attack) 12/23/2014  . 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 Disorder of bone and articular cartilage 09/29/2014   PCP:  Patient, No Pcp Per Pharmacy:   [place], [place] [address] [place] [address] Phone: [phone] Fax: [phone]     Social Determinants of Health (SDOH) Interventions    Readmission Risk Interventions No flowsheet data found.

## 2019-10-11 NOTE — Care Management Important Message (Signed)
Important Message  Patient Details  Name: Sydney Evans MRN: [id number] Date of Birth: 1951-02-09   Medicare Important Message Given:  Yes     Dannette Barbara 10/11/2019, 11:59 AM

## 2019-10-11 NOTE — Discharge Instructions (Signed)
Shoulder Joint Replacement, Care After °This sheet gives you information about how to care for yourself after your procedure. Your health care provider may also give you more specific instructions. If you have problems or questions, contact your health care provider. °What can I expect after the procedure? °After your procedure, it is common to have: °· A bruised and stiff shoulder. °· A bruised and stiff arm. °· Some pain. °Follow these instructions at home: °If you have a sling: ° °· Wear the sling as told by your health care provider. Remove it only as told by your health care provider. °· Loosen the sling if your fingers tingle, become numb, or turn cold and blue. °· Keep the sling clean. °· If the sling is not waterproof: °? Do not let it get wet. °? Cover it with a watertight covering when you take a bath or a shower. °Bathing °· Do not take baths, swim, or use a hot tub until your health care provider approves. Ask your health care provider if you can take showers. You may only be allowed to take sponge baths for bathing. °· If your sling is not waterproof, cover it with a watertight covering when you take a bath or a shower. °· Keep the bandage (dressing) dry until your health care provider says it can be removed. °Managing pain, stiffness, and swelling °· If directed, put ice on the affected area. °? If you have a removable sling, remove it as told by your health care provider. °? Put ice in a plastic bag. °? Place a towel between your skin and the bag. °? Leave the ice on for 20 minutes, 2-3 times a day. °· Move your fingers often to avoid stiffness and to lessen swelling. °Driving °· Do not drive or use heavy machinery while taking prescription pain medicine. °· Do not drive for 2-4 weeks after surgery or as told by your health care provider. °Medicine °· Take over-the-counter and prescription medicines only as told by your health care provider. °· If you were prescribed an antibiotic medicine, use it as  told by your health care provider. Do not stop using the antibiotic even if you start to feel better. °Incision care °· Follow instructions from your health care provider about how to take care of your incision area. Make sure you: °? Wash your hands with soap and water before you change your bandage (dressing). If soap and water are not available, use hand sanitizer. °? Change your dressing as told by your health care provider. °? Leave staples, stitches (sutures), skin glue, or adhesive strips in place. These skin closures may need to stay in place for 2 weeks or longer. If adhesive strip edges start to loosen and curl up, you may trim the loose edges. Do not remove adhesive strips completely unless your health care provider tells you to do that. °· If you have a tube to remove drainage, follow instructions from your health care provider about caring for it. Do not remove the drain tube or any dressings around the tube opening unless your health care provider approves. °· Check your incision area every day for signs of infection. Check for: °? More redness, swelling, or pain. °? More fluid or blood. °? Warmth. °? Pus or a bad smell. °Activity °· Do not use your arm to push yourself up in bed or from a chair. This requires too much muscle. °· Follow lifting restrictions as told: °? Do not lift anything that is heavier than a cup   of coffee for the first 6 weeks after surgery, or as told by your health care provider. °? Do not lift anything that is heavier than 10 lb (4.5 kg) for 6 months, or as told by your health care provider. °· Do exercises, including physical therapy, as told by your health care provider. °· Try not to overuse your shoulder. This includes repetitive pushing or pulling. Early overuse of the shoulder may result in later problems. (Overusing the shoulder is easy to do when your pain goes away for the first time.) °· Avoid overstretching your arm for 6 weeks after surgery, or as told by your health  care provider. °· Avoid sitting for a long time without moving. Get up and move around one or more times every few hours. °· Ask for help with some activities. Your health care provider may be able to suggest a clinic or agency for this if you do not have home support. °· Do not participate in contact sports. °General instructions °· Keep all follow-up visits as told by your health care provider. This is important. °· Do not use any products that contain nicotine or tobacco, such as cigarettes and e-cigarettes. These can delay healing. If you need help quitting, ask your health care provider. °Contact a health care provider if: °· You develop a rash. °· You have a fever. °· You have more redness, swelling, or pain in the incision area. °· You have more fluid or blood coming from your incision area. °· You have more pain when moving your shoulder. °Get help right away if: °· Your incision area feels warm to the touch. °· You have pus or a bad smell coming from your incision area. °· The edges of the incision site break open after sutures have been removed. °· You have chest pain or shortness of breath. °Summary °· It is common to have pain and stiffness in your shoulder and arm after the procedure. Put ice on the affected area and take pain medicine as told by your health care provider. °· Do not use your arm to push yourself up in bed or from a chair. °· Do exercises, including physical therapy, as told by your health care provider. °· Check your incision area daily. Call your health care provider if you see signs of infection. °This information is not intended to replace advice given to you by your health care provider. Make sure you discuss any questions you have with your health care provider. °Document Revised: 08/14/2018 Document Reviewed: 02/05/2016 °Elsevier Patient Education © 2020 [employer] ° °

## 2019-10-11 NOTE — Consult Note (Signed)
PHARMACY CONSULT NOTE - FOLLOW UP  Pharmacy Consult for Electrolyte Monitoring and Replacement   Recent Labs: Potassium (mmol/L)  Date Value  10/11/2019 3.9   Magnesium (mg/dL)  Date Value  10/11/2019 1.5 (L)   Calcium (mg/dL)  Date Value  10/11/2019 7.8 (L)   Albumin (g/dL)  Date Value  10/05/2019 2.1 (L)   Sodium (mmol/L)  Date Value  10/11/2019 135     Assessment: 69 year old admitted with diffuse ileus, and recent right total shoulder arthroplasty on 5/27.  Nausea and vomiting reported in chart, but seems to be improving.  Pharmacy has been consulted to replace potassium.  Goal of Therapy:  Potassium >4  Plan:  Will give KCl 20 mEq x 1.  Mg replaced with Mg 2 g IV x 1 by MD.   Pharmacy will continue to follow and replace potassium as needed.   Oswald Hillock, PharmD, BCPS Clinical Pharmacist 10/11/2019 9:05 AM

## 2019-10-12 LAB — TYPE AND SCREEN
ABO/RH(D): O POS
Antibody Screen: NEGATIVE
Unit division: 0

## 2019-10-12 LAB — BPAM RBC
Blood Product Expiration Date: 202107042359
ISSUE DATE / TIME: 202106071136
Unit Type and Rh: 5100

## 2019-10-15 ENCOUNTER — Ambulatory Visit: Payer: Medicare HMO | Admitting: Cardiology

## 2019-10-18 ENCOUNTER — Encounter: Payer: Self-pay | Admitting: Cardiology

## 2019-10-21 ENCOUNTER — Other Ambulatory Visit: Payer: Self-pay

## 2019-10-21 ENCOUNTER — Emergency Department
Admission: EM | Admit: 2019-10-21 | Discharge: 2019-10-21 | Disposition: A | Discharge status: Home / Self Care | Payer: Medicare HMO | Attending: Emergency Medicine | Admitting: Emergency Medicine

## 2019-10-21 ENCOUNTER — Emergency Department: Payer: Medicare HMO

## 2019-10-21 DIAGNOSIS — Z96612 Presence of left artificial shoulder joint: Secondary | ICD-10-CM | POA: Insufficient documentation

## 2019-10-21 DIAGNOSIS — Z96651 Presence of right artificial knee joint: Secondary | ICD-10-CM | POA: Diagnosis not present

## 2019-10-21 DIAGNOSIS — F17210 Nicotine dependence, cigarettes, uncomplicated: Secondary | ICD-10-CM | POA: Insufficient documentation

## 2019-10-21 DIAGNOSIS — I10 Essential (primary) hypertension: Secondary | ICD-10-CM | POA: Diagnosis not present

## 2019-10-21 DIAGNOSIS — S0101XA Laceration without foreign body of scalp, initial encounter: Secondary | ICD-10-CM | POA: Insufficient documentation

## 2019-10-21 DIAGNOSIS — W19XXXA Unspecified fall, initial encounter: Secondary | ICD-10-CM

## 2019-10-21 DIAGNOSIS — Z96611 Presence of right artificial shoulder joint: Secondary | ICD-10-CM | POA: Diagnosis not present

## 2019-10-21 DIAGNOSIS — R42 Dizziness and giddiness: Secondary | ICD-10-CM | POA: Diagnosis not present

## 2019-10-21 DIAGNOSIS — W01198A Fall on same level from slipping, tripping and stumbling with subsequent striking against other object, initial encounter: Secondary | ICD-10-CM | POA: Insufficient documentation

## 2019-10-21 DIAGNOSIS — S0990XA Unspecified injury of head, initial encounter: Secondary | ICD-10-CM | POA: Diagnosis present

## 2019-10-21 DIAGNOSIS — Z8542 Personal history of malignant neoplasm of other parts of uterus: Secondary | ICD-10-CM | POA: Insufficient documentation

## 2019-10-21 DIAGNOSIS — Z96652 Presence of left artificial knee joint: Secondary | ICD-10-CM | POA: Insufficient documentation

## 2019-10-21 DIAGNOSIS — Y92017 Garden or yard in single-family (private) house as the place of occurrence of the external cause: Secondary | ICD-10-CM | POA: Insufficient documentation

## 2019-10-21 DIAGNOSIS — Z7982 Long term (current) use of aspirin: Secondary | ICD-10-CM | POA: Diagnosis not present

## 2019-10-21 DIAGNOSIS — Y998 Other external cause status: Secondary | ICD-10-CM | POA: Insufficient documentation

## 2019-10-21 DIAGNOSIS — Z8673 Personal history of transient ischemic attack (TIA), and cerebral infarction without residual deficits: Secondary | ICD-10-CM | POA: Insufficient documentation

## 2019-10-21 DIAGNOSIS — Z79899 Other long term (current) drug therapy: Secondary | ICD-10-CM | POA: Diagnosis not present

## 2019-10-21 DIAGNOSIS — Y9301 Activity, walking, marching and hiking: Secondary | ICD-10-CM | POA: Insufficient documentation

## 2019-10-21 NOTE — ED Notes (Signed)
See triage note  States she lost her balance  [address] hitting her head  Hx of vertigo  States she only wishes to have a head CT only

## 2019-10-21 NOTE — ED Provider Notes (Signed)
[facility] Emergency Department Provider Note  ____________________________________________  Time seen: Approximately 3:03 PM  I have reviewed the triage vital signs and the nursing notes.   HISTORY  Chief Complaint Fall and Dizziness    HPI Sydney Evans is a 69 y.o. female who presents the emergency department complaining of a cut to the occipital skull.  Patient states that she was walking her house, missed the edge of the sidewalk stepping into a dark causing her to lose her balance.  She states that this was aggravated by the fact that she has some vertigo and is well as a recent shoulder surgery limiting what she can do with her right upper extremity.  Between the 2 factors she states that she fell fairly striking the back of her head on the sidewalk.  Patient sustained no loss of consciousness.  She denied any headache, visual changes.  No neck pain.  Patient states that they took a look at the cut prior to arrival and she does not believe that it is deep enough to require any closure.  She states that she is primarily here because her family insisted that she have a CT scan to ensure that she did not do any damage.  Patient is adamant that she does not want any other work-up, would like her imaging, "prove that everything is okay" and then leave.  Patient denies any extremity pain.  Again she recently had shoulder surgery but states that she is sustained no injuries to her shoulder during the fall.         Past Medical History:  Diagnosis Date  . Anemia   . Anxiety   . Arthritis   . Barrett's esophagus   . Bipolar disorder ([place])   . 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 Blood clot in vein   . Cancer ([place])    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  . Carotid arterial disease ([place])   . Contracture of joint of upper arm   . Depression   . Elevated lipids   . GERD (gastroesophageal reflux disease)   . Hypertension   . Migraine   . Osteoporosis   . Poor balance   . Sinus  congestion   . 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 TIA (transient ischemic attack)   . Vertigo     Patient Active Problem List   Diagnosis Date Noted  . Generalized abdominal pain   . Shock ([place])   . Elective surgery   . Hypokalemia 10/07/2019  . Hypomagnesemia 10/07/2019  . Dislocation of right shoulder joint 10/06/2019  . Pre-op evaluation   . NSVT (nonsustained ventricular tachycardia) ([place])   . Ileus ([place]) 10/03/2019  . 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 Post-concussion headache 01/20/2018  . Iron deficiency anemia 10/05/2017  . Carotid stenosis 11/17/2016  . GERD (gastroesophageal reflux disease) 11/17/2016  . Essential hypertension 11/17/2016  . Hyperlipidemia 11/17/2016  . 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 Rotator cuff tendinitis, left 05/20/2016  . History of TIAs 07/17/2015  . 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 TIA (transient ischemic attack) 12/23/2014  . 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 Disorder of bone and articular cartilage 09/29/2014    Past Surgical History:  Procedure Laterality Date  . ABDOMINAL HYSTERECTOMY    . BACK SURGERY    . BREAST BIOPSY Left   . CARPAL TUNNEL RELEASE Bilateral   . CATARACT EXTRACTION W/ INTRAOCULAR  LENS  IMPLANT, BILATERAL Bilateral   . CHOLECYSTECTOMY    . COLONOSCOPY WITH PROPOFOL N/A 04/09/2018   Procedure: COLONOSCOPY WITH PROPOFOL;  Surgeon: Jonathon Bellows, MD;  Location: [facility] ENDOSCOPY;  Service: Gastroenterology;  Laterality: N/A;  . crystal cyst removed on left foot    . ESOPHAGOGASTRODUODENOSCOPY (EGD) WITH PROPOFOL N/A 04/09/2018   Procedure: ESOPHAGOGASTRODUODENOSCOPY (EGD) WITH PROPOFOL;  Surgeon: Jonathon Bellows, MD;  Location: [facility] ENDOSCOPY;  Service: Gastroenterology;  Laterality: N/A;  . EYE SURGERY    . femoral fx    . FOOT SURGERY    . GIVENS CAPSULE STUDY N/A 06/16/2018   Procedure: GIVENS CAPSULE STUDY;  Surgeon: Jonathon Bellows,  MD;  Location: [facility] ENDOSCOPY;  Service: Gastroenterology;  Laterality: N/A;  . JOINT REPLACEMENT    . KNEE SURGERY    . ORIF PERIPROSTHETIC FRACTURE Right 10/07/2019   Procedure: OPEN REDUCTION INTERNAL FIXATION (ORIF) PERIPROSTHETIC FRACTURE;  Surgeon: Corky Mull, MD;  Location: [facility] ORS;  Service: Orthopedics;  Laterality: Right;  . TONSILLECTOMY    . TOTAL KNEE ARTHROPLASTY Bilateral   . TOTAL SHOULDER ARTHROPLASTY Left 08/01/2016   Procedure: TOTAL SHOULDER ARTHROPLASTY;  Surgeon: Corky Mull, MD;  Location: [facility] ORS;  Service: Orthopedics;  Laterality: Left;  . TOTAL SHOULDER ARTHROPLASTY Right 09/30/2019   Procedure: TOTAL SHOULDER ARTHROPLASTY;  Surgeon: Corky Mull, MD;  Location: [facility] ORS;  Service: Orthopedics;  Laterality: Right;    Prior to Admission medications   Medication Sig Start Date End Date Taking? Authorizing Provider  aspirin EC 325 MG tablet Take 1 tablet (325 mg total) by mouth daily. 09/30/19   Lattie Corns, PA-C  Carboxymethylcellulose Sodium (LUBRICANT EYE DROPS OP) Place 1 drop into both eyes daily as needed (Dry eye). Systane    [provider]  citalopram (CELEXA) 40 MG tablet Take 40 mg by mouth daily.    [provider]  diclofenac Sodium (VOLTAREN) 1 % GEL Apply 1 application topically daily as needed. 09/13/19   [provider]  diphenhydrAMINE (BENADRYL) 25 MG tablet Take 25 mg by mouth every 6 (six) hours as needed for itching.    [provider]  fluticasone (FLONASE) 50 MCG/ACT nasal spray Place 2 sprays into both nostrils daily as needed for allergies or rhinitis.    [provider]  hydrochlorothiazide (HYDRODIURIL) 25 MG tablet Take 25 mg by mouth daily. 01/16/18   [provider]  IBU 800 MG tablet Take 800 mg by mouth daily as needed for mild pain or moderate pain.  09/25/17   [provider]  lamoTRIgine (LAMICTAL) 200 MG tablet Take 200 mg by mouth daily.    [provider]  lidocaine (LIDODERM) 5 % Place 1 patch onto the skin daily as needed (arthritis).  09/13/19   [provider]  lisinopril (PRINIVIL,ZESTRIL) 40 MG tablet Take 40 mg by mouth daily.    [provider]  omeprazole (PRILOSEC) 40 MG capsule Take 40 mg by mouth daily.    [provider]  tiZANidine (ZANAFLEX) 2 MG tablet Take 2 mg by mouth 2 (two) times daily as needed for muscle spasms.  09/10/19   [provider]  traMADol (ULTRAM) 50 MG tablet Take 1 tablet (50 mg total) by mouth every 6 (six) hours. 09/30/19   Lattie Corns, PA-C  Travoprost, BAK Free, (TRAVATAN) 0.004 % SOLN ophthalmic solution Place 1 drop into both eyes at bedtime. 08/10/19   [provider]    Allergies Hydroxyzine, Gabapentin, Hydrocodone, Ketorolac,  Percocet [oxycodone-acetaminophen], and Toradol [ketorolac tromethamine]  Family History  Problem Relation Age of Onset  . Other Mother        ?lupus   . Cancer Mother   . High Cholesterol Mother   . CAD Father        CABG  . High Cholesterol Father   . Arthritis Father   . Cancer Sister   . Heart murmur Sister   . Heart murmur Brother   . High blood pressure Other        "for everybody"  . High Cholesterol Other        "for everybody"    Social History Social History   Tobacco Use  . Smoking status: Current Every Day Smoker    Packs/day: 0.50    Types: Cigarettes    Last attempt to quit: 08/04/2016    Years since quitting: 3.2  . Smokeless tobacco: Never Used  Vaping Use  . Vaping Use: Never used  Substance Use Topics  . Alcohol use: No  . Drug use: No     Review of Systems  Constitutional: No fever/chills Eyes: No visual changes. No discharge ENT: No upper respiratory complaints. Cardiovascular: no chest pain. Respiratory: no cough. No SOB. Gastrointestinal: No abdominal pain.  No nausea, no vomiting.  No diarrhea.  No constipation. Musculoskeletal: Negative for musculoskeletal pain. Skin:  Posterior scalp laceration Neurological: Negative for headaches, focal weakness or numbness. 10-point ROS otherwise negative.  ____________________________________________   PHYSICAL EXAM:  VITAL SIGNS: ED Triage Vitals  Enc Vitals Group     BP 10/21/19 1420 (!) 152/68     Pulse Rate 10/21/19 1420 75     Resp 10/21/19 1420 18     Temp 10/21/19 1420 99.1 F (37.3 C)     Temp Source 10/21/19 1420 Oral     SpO2 10/21/19 1420 100 %     Weight 10/21/19 1421 150 lb (68 kg)     Height 10/21/19 1421 5\' 2"  (1.575 m)     Head Circumference --      Peak Flow --      Pain Score 10/21/19 1421 6     Pain Loc --      Pain Edu? --      Excl. in [place]? --      Constitutional: Alert and oriented. Well appearing and in no acute distress. Eyes: Conjunctivae are normal. PERRL. EOMI. Head: Visualization of the occipital skull reveals very superficial abrasion type laceration measuring approximately 0.5 cm in diameter.  No active bleeding but dried blood is noted in the hair.  No foreign body.  Patient is minimally tender to palpation over the abrasion itself but no other tenderness to palpation of the osseous structures of the skull and face.  No palpable abnormalities.  No crepitus.  No battle signs, raccoon eyes, serosanguineous fluid drainage from the ears or nares. ENT:      Ears:       Nose: No congestion/rhinnorhea.      Mouth/Throat: Mucous membranes are moist.  Neck: No stridor.  No cervical spine tenderness to palpation  Cardiovascular: Normal rate, regular rhythm. Normal S1 and S2.  Good peripheral circulation. Respiratory: Normal respiratory effort without tachypnea or retractions. Lungs CTAB. Good air entry to the bases with no decreased or absent breath sounds. Musculoskeletal: Full range of motion to 3 extremities.  Patient recently had shoulder surgery and has the right upper arm in a sling.  No acute traumatic findings to any extremity. Neurologic:  Normal speech and language. No  gross focal neurologic deficits are appreciated.  Cranial nerves II through XII grossly intact. Skin:  Skin is warm, dry and intact. No rash noted. Psychiatric: Mood and affect are normal. Speech and behavior are normal. Patient exhibits appropriate insight and judgement.   ____________________________________________   LABS (all labs ordered are listed, but only abnormal results are displayed)  Labs Reviewed - No data to display ____________________________________________  EKG   ____________________________________________  RADIOLOGY   CT Head Wo Contrast  Result Date: 10/21/2019 CLINICAL DATA:  Loss of balance, hit head, scalp laceration, dizziness EXAM: CT HEAD WITHOUT CONTRAST TECHNIQUE: Contiguous axial images were obtained from the base of the skull through the vertex without intravenous contrast. COMPARISON:  08/01/2018 FINDINGS: Brain: No acute infarct or hemorrhage. Lateral ventricles and midline structures are unremarkable. No acute extra-axial fluid collections. No mass effect. Vascular: No hyperdense vessel or unexpected calcification. Skull: Right posterior scalp hematoma. No underlying fracture. Remainder of the calvarium is unremarkable. Sinuses/Orbits: No acute finding. Other: None. IMPRESSION: 1. Stable head CT, no acute process. Electronically Signed   By: Randa Ngo M.D.   On: 10/21/2019 15:19    ____________________________________________    PROCEDURES  Procedure(s) performed:    Procedures    Medications - No data to display   ____________________________________________   INITIAL IMPRESSION / ASSESSMENT AND PLAN / ED COURSE  Pertinent labs & imaging results that were available during my care of the patient were reviewed by me and considered in my medical decision making (see chart for details).  Review of the [place] CSRS was performed in accordance of the [place] prior to dispensing any controlled drugs.           Patient's diagnosis is  consistent with fall, scalp abrasion.  Patient presented to emergency department after sustaining a mechanical fall.  Patient states that she has vertigo as well as having her right arm in a sling from a recent surgery.  She stepped off the edge of a sidewalk into the dirt, started to lose her balance between the vertigo and not being able to use her right upper extremity she ended up falling lightly striking the back of her head.  No loss of consciousness.  Exam was reassuring with superficial abrasion the patient was overall neurologically intact.  Patient originally had labs placed but she declined.  At this time I feel this is reasonable as this was largely mechanical in nature, and I do not feel that labs are necessary.  These will be canceled..  CT scan was reassuring with no acute traumatic findings.  At this time no indication for closure of superficial abrasion to the occipital skull.  Patient will be discharged at this time with instructions to follow-up with primary care as needed. Patient is given ED precautions to return to the ED for any worsening or new symptoms.     ____________________________________________  FINAL CLINICAL IMPRESSION(S) / ED DIAGNOSES  Final diagnoses:  Fall, initial encounter  Laceration of scalp, initial encounter      NEW MEDICATIONS STARTED DURING THIS VISIT:  ED Discharge Orders    None          This chart was dictated using voice recognition software/Dragon. Despite best efforts to proofread, errors can occur which can change the meaning. Any change was purely unintentional.    Darletta Moll, PA-C 10/21/19 1554    Nance Pear, MD 10/21/19 1558

## 2019-10-21 NOTE — ED Notes (Signed)
Pt is refusing any blood work or EKG. Pt wants CT scan of her head and to go home.

## 2019-10-21 NOTE — ED Triage Notes (Signed)
Pt comes POV after getting dizzy and falling. Pt states that she hs vertigo and lost her balance. C/o lac to back of head bleeding controlled at this time.

## 2019-11-11 ENCOUNTER — Encounter: Payer: Self-pay | Admitting: Family Medicine

## 2019-11-11 ENCOUNTER — Telehealth: Payer: Self-pay | Admitting: Family Medicine

## 2019-11-11 ENCOUNTER — Ambulatory Visit (INDEPENDENT_AMBULATORY_CARE_PROVIDER_SITE_OTHER): Payer: Medicare HMO | Admitting: Family Medicine

## 2019-11-11 ENCOUNTER — Other Ambulatory Visit: Payer: Self-pay

## 2019-11-11 VITALS — BP 152/73 | HR 53 | Temp 98.2°F | Ht 61.0 in | Wt 142.0 lb

## 2019-11-11 DIAGNOSIS — Z96611 Presence of right artificial shoulder joint: Secondary | ICD-10-CM

## 2019-11-11 DIAGNOSIS — K219 Gastro-esophageal reflux disease without esophagitis: Secondary | ICD-10-CM

## 2019-11-11 DIAGNOSIS — R112 Nausea with vomiting, unspecified: Secondary | ICD-10-CM | POA: Diagnosis not present

## 2019-11-11 DIAGNOSIS — F419 Anxiety disorder, unspecified: Secondary | ICD-10-CM

## 2019-11-11 DIAGNOSIS — D649 Anemia, unspecified: Secondary | ICD-10-CM | POA: Diagnosis not present

## 2019-11-11 DIAGNOSIS — I10 Essential (primary) hypertension: Secondary | ICD-10-CM | POA: Diagnosis not present

## 2019-11-11 DIAGNOSIS — Z8673 Personal history of transient ischemic attack (TIA), and cerebral infarction without residual deficits: Secondary | ICD-10-CM

## 2019-11-11 DIAGNOSIS — F319 Bipolar disorder, unspecified: Secondary | ICD-10-CM

## 2019-11-11 MED ORDER — PROMETHAZINE HCL 25 MG/ML IJ SOLN
25.0000 mg | Freq: Once | INTRAMUSCULAR | Status: AC
  Administered 2019-11-11: 25 mg via INTRAMUSCULAR

## 2019-11-11 MED ORDER — ONDANSETRON 4 MG PO TBDP: 4.0000 mg | ORAL_TABLET | Freq: Three times a day (TID) | ORAL | 0 refills | Status: DC | PRN

## 2019-11-11 NOTE — Progress Notes (Signed)
BP (!) 152/73   Pulse (!) 53   Temp 98.2 F (36.8 C) (Oral)   Ht 5\' 1"  (1.549 m)   Wt 142 lb (64.4 kg)   SpO2 99%   BMI 26.83 kg/m    Subjective:    Patient ID: Sydney Evans, female    DOB: Aug 01, 1950, 69 y.o.   MRN: [id number]  HPI: Sydney Evans is a 69 y.o. female  Chief Complaint  Patient presents with  . Establish Care    pt states not feeling well this morning  . Nausea   Presenting today with her niece who is a primary caregiver to establish care.   Has been in the hospital several times the past 2 months for right shoulder surgery, UTI, intractable nausea and vomiting, and fall. Still dealing with the nausea and dry heaving, barely wanting to eat or drink anything. Was given zofran in the hospital but rarely takes it because she was trying to make her pills last. Shoulder continues to be in a sling, taking tylenol prn for the pain and following with orthopedics.   Left ear deafness and vertigo, daily nausea.   Just had right shoulder surgery almost 2 months ago. Taking anti emetics from orthopedic surgeon and tylenol prn. Has home health PT coming in to work with her. Has severe arthritis in multiple other joints also followed by orthopedics. Uses wheelchair and assistive devices for ambulation. Very weak since these recent issues in addition to her poor PO intake.   Anemia, per recent labs the last few months, with hg around 7. Baseline per record review seems to be around 10. States she used to go to Hematology for infusions but has not been in quite some time because they had wanted to to draw blood more frequently than she was comfortable. Denies CP, SOB, overt bleeding issues.   Hx of TIAs, no recent neurologic sxs.   Anxiety and bipolar depression - on celexa and lamictal which works fairly well. Struggling with her anxiety due to health issues and family stressors. Does not wish to change regimen. Agreeable to counseling. Denies SI/HI.   GERD and per one  provider Barrett's esophagus, though she states another provider told her she did not have this. Takes prilosec daily for this. Unsure if this is the cause of her severe N/V the past few months.   Depression screen [facility] 2/9 11/11/2019  Decreased Interest 0  Down, Depressed, Hopeless 0  PHQ - 2 Score 0  Altered sleeping 0  Tired, decreased energy 1  Change in appetite 1  Feeling bad or failure about yourself  0  Trouble concentrating 0  Moving slowly or fidgety/restless 0  Suicidal thoughts 0  PHQ-9 Score 2   GAD 7 : Generalized Anxiety Score 11/11/2019  Nervous, Anxious, on Edge 3  Control/stop worrying 3  Worry too much - different things 2  Trouble relaxing 2  Restless 1  Easily annoyed or irritable 0  Afraid - awful might happen 0  Total GAD 7 Score 11  Anxiety Difficulty Not difficult at all   Relevant past medical, surgical, family and social history reviewed and updated as indicated. Interim medical history since our last visit reviewed. Allergies and medications reviewed and updated.  Review of Systems  Per HPI unless specifically indicated above     Objective:    BP (!) 152/73   Pulse (!) 53   Temp 98.2 F (36.8 C) (Oral)   Ht 5\' 1"  (1.549 m)   Wt  142 lb (64.4 kg)   SpO2 99%   BMI 26.83 kg/m   Wt Readings from Last 3 Encounters:  11/11/19 142 lb (64.4 kg)  10/21/19 150 lb (68 kg)  10/11/19 158 lb (71.7 kg)    Physical Exam Vitals and nursing note reviewed.  Constitutional:      Appearance: She is ill-appearing.     Comments: Dry heaving into eme-bag  HENT:     Head: Atraumatic.  Eyes:     Extraocular Movements: Extraocular movements intact.     Conjunctiva/sclera: Conjunctivae normal.  Cardiovascular:     Rate and Rhythm: Normal rate and regular rhythm.     Heart sounds: Normal heart sounds.  Pulmonary:     Effort: Pulmonary effort is normal.     Breath sounds: Normal breath sounds.  Abdominal:     General: Bowel sounds are normal. There is no  distension.     Palpations: Abdomen is soft.     Tenderness: There is abdominal tenderness (mild generalized ttp).  Musculoskeletal:     Cervical back: Normal range of motion and neck supple.     Comments: In wheelchair, right arm in sling  Skin:    General: Skin is warm and dry.  Neurological:     Mental Status: She is alert and oriented to person, place, and time.  Psychiatric:        Thought Content: Thought content normal.        Judgment: Judgment normal.     Comments: Anxious     Results for orders placed or performed in visit on 11/11/19  CBC with Differential/Platelet  Result Value Ref Range   WBC 5.6 3.4 - 10.8 x10E3/uL   RBC 4.38 3.77 - 5.28 x10E6/uL   Hemoglobin 10.8 (L) 11.1 - 15.9 g/dL   Hematocrit 34.6 34.0 - 46.6 %   MCV 79 79 - 97 fL   MCH 24.7 (L) 26.6 - 33.0 pg   MCHC 31.2 (L) 31 - 35 g/dL   RDW 18.5 (H) 11.7 - 15.4 %   Platelets 479 (H) 150 - 450 x10E3/uL   Neutrophils 71 Not Estab. %   Lymphs 19 Not Estab. %   Monocytes 7 Not Estab. %   Eos 2 Not Estab. %   Basos 1 Not Estab. %   Neutrophils Absolute 4.0 1 - 7 x10E3/uL   Lymphocytes Absolute 1.0 0 - 3 x10E3/uL   Monocytes Absolute 0.4 0 - 0 x10E3/uL   EOS (ABSOLUTE) 0.1 0.0 - 0.4 x10E3/uL   Basophils Absolute 0.1 0 - 0 x10E3/uL   Immature Granulocytes 0 Not Estab. %   Immature Grans (Abs) 0.0 0.0 - 0.1 x10E3/uL  Comprehensive metabolic panel  Result Value Ref Range   Glucose 80 65 - 99 mg/dL   BUN 4 (L) 8 - 27 mg/dL   Creatinine, Ser 0.82 0.57 - 1.00 mg/dL   GFR calc non Af Amer 74 >59 mL/min/1.73   GFR calc Af Amer 86 >59 mL/min/1.73   BUN/Creatinine Ratio 5 (L) 12 - 28   Sodium 143 134 - 144 mmol/L   Potassium 4.6 3.5 - 5.2 mmol/L   Chloride 106 96 - 106 mmol/L   CO2 22 20 - 29 mmol/L   Calcium 8.7 8.7 - 10.3 mg/dL   Total Protein 5.8 (L) 6.0 - 8.5 g/dL   Albumin 3.2 (L) 3.8 - 4.8 g/dL   Globulin, Total 2.6 1.5 - 4.5 g/dL   Albumin/Globulin Ratio 1.2 1.2 - 2.2   Bilirubin Total <  0.2 0.0  - 1.2 mg/dL   Alkaline Phosphatase 136 (H) 48 - 121 IU/L   AST 15 0 - 40 IU/L   ALT 7 0 - 32 IU/L      Assessment & Plan:   Problem List Items Addressed This Visit      Cardiovascular and Mediastinum   Essential hypertension - Primary    BP mildly elevated today, but quite anxious, dry heaving and in pain today. Declines medication for this at this time, will continue to monitor once her overall condition improves.       Relevant Orders   Comprehensive metabolic panel (Completed)     Digestive   GERD (gastroesophageal reflux disease)    Stable, well controlled on prilosec. Continue current regimen      Relevant Medications   ondansetron (ZOFRAN-ODT) 4 MG disintegrating tablet     Other   History of TIAs    Stable with no new neurologic deficits. Continue to monitor, lifestyle habits reviewed and will watch lipids and BPs closely      Status post total shoulder arthroplasty, right    Following with Orthopedics, continue per their recommendations      Bipolar depression ([place])    Declines referral to specialist or change in regimen. Agreeable to referral to [facility] for initial counseling. Referral generated. Continue lamictal and celexa for now      Relevant Orders   Referral to Chronic Care Management Services   Anxiety    Patient declines change in regimen today, continue lamictal and celexa regimen and refer to [facility] for counseling discussion      Relevant Orders   Referral to Chronic Care Management Services    Other Visit Diagnoses    Severe anemia       Recheck CBC, will refer back to Hematology if still signifciantly below baseline. Discussed importance also of getting PO intake up, supplements once tolerated   Relevant Orders   CBC with Differential/Platelet (Completed)   Nausea and vomiting, intractability of vomiting not specified, unspecified vomiting type       Unclear cause at this time. Continue zofran prn, IM phenergan today in  clinic. Strongly recommended GI referral given severity and duration, she declines   Relevant Medications   promethazine (PHENERGAN) injection 25 mg (Completed)      60 minutes spent today in care, counseling and coordination of care with patient and her niece today  Follow up plan: Return for CPE.

## 2019-11-11 NOTE — Chronic Care Management (AMB) (Signed)
  Chronic Care Management   Note  11/11/2019 Name: Sydney Evans MRN: [id number] DOB: [birth year]  Sydney Evans is a 69 y.o. year old female who is a primary care patient of Volney American, [place]. I reached out to Sydney Evans by phone today in response to a referral sent by Sydney Evans PCP, Volney American, PA-C.     Sydney Evans was given information about Chronic Care Management services today including:  1. CCM service includes personalized support from designated clinical staff supervised by her physician, including individualized plan of care and coordination with other care providers 2. 24/7 contact phone numbers for assistance for urgent and routine care needs. 3. Service will only be billed when office clinical staff spend 20 minutes or more in a month to coordinate care. 4. Only one practitioner may furnish and bill the service in a calendar month. 5. The patient may stop CCM services at any time (effective at the end of the month) by phone call to the office staff. 6. The patient will be responsible for cost sharing (co-pay) of up to 20% of the service fee (after annual deductible is met).  Patient agreed to services and verbal consent obtained.   Follow up plan: Telephone appointment with care management team member scheduled for: 11/17/2019  [facility]  [place], [place] [address] Direct Dial: [email or web address] Website: [email or web address]

## 2019-11-12 LAB — CBC WITH DIFFERENTIAL/PLATELET
Basophils Absolute: 0.1 10*3/uL (ref 0.0–0.2)
Basos: 1 %
EOS (ABSOLUTE): 0.1 10*3/uL (ref 0.0–0.4)
Eos: 2 %
Hematocrit: 34.6 % (ref 34.0–46.6)
Hemoglobin: 10.8 g/dL — ABNORMAL LOW (ref 11.1–15.9)
Immature Grans (Abs): 0 10*3/uL (ref 0.0–0.1)
Immature Granulocytes: 0 %
Lymphocytes Absolute: 1 10*3/uL (ref 0.7–3.1)
Lymphs: 19 %
MCH: 24.7 pg — ABNORMAL LOW (ref 26.6–33.0)
MCHC: 31.2 g/dL — ABNORMAL LOW (ref 31.5–35.7)
MCV: 79 fL (ref 79–97)
Monocytes Absolute: 0.4 10*3/uL (ref 0.1–0.9)
Monocytes: 7 %
Neutrophils Absolute: 4 10*3/uL (ref 1.4–7.0)
Neutrophils: 71 %
Platelets: 479 10*3/uL — ABNORMAL HIGH (ref 150–450)
RBC: 4.38 x10E6/uL (ref 3.77–5.28)
RDW: 18.5 % — ABNORMAL HIGH (ref 11.7–15.4)
WBC: 5.6 10*3/uL (ref 3.4–10.8)

## 2019-11-12 LAB — COMPREHENSIVE METABOLIC PANEL
ALT: 7 IU/L (ref 0–32)
AST: 15 IU/L (ref 0–40)
Albumin/Globulin Ratio: 1.2 (ref 1.2–2.2)
Albumin: 3.2 g/dL — ABNORMAL LOW (ref 3.8–4.8)
Alkaline Phosphatase: 136 IU/L — ABNORMAL HIGH (ref 48–121)
BUN/Creatinine Ratio: 5 — ABNORMAL LOW (ref 12–28)
BUN: 4 mg/dL — ABNORMAL LOW (ref 8–27)
Bilirubin Total: <0.2 mg/dL (ref 0.0–1.2)
CO2: 22 mmol/L (ref 20–29)
Calcium: 8.7 mg/dL (ref 8.7–10.3)
Chloride: 106 mmol/L (ref 96–106)
Creatinine, Ser: 0.82 mg/dL (ref 0.57–1.00)
GFR calc Af Amer: 86 mL/min/{1.73_m2} (ref >59)
GFR calc non Af Amer: 74 mL/min/{1.73_m2} (ref >59)
Globulin, Total: 2.6 g/dL (ref 1.5–4.5)
Glucose: 80 mg/dL (ref 65–99)
Potassium: 4.6 mmol/L (ref 3.5–5.2)
Sodium: 143 mmol/L (ref 134–144)
Total Protein: 5.8 g/dL — ABNORMAL LOW (ref 6.0–8.5)

## 2019-11-15 ENCOUNTER — Telehealth: Payer: Self-pay | Admitting: Family Medicine

## 2019-11-15 NOTE — Telephone Encounter (Signed)
Routing to provider for results.

## 2019-11-15 NOTE — Telephone Encounter (Signed)
Patient's niece notified of results and 4 week f/up scheduled.

## 2019-11-15 NOTE — Telephone Encounter (Signed)
Patient called to request the results of her blood work.  Please advise and call patient with results at [phone]

## 2019-11-15 NOTE — Telephone Encounter (Signed)
Please let her know that her labs were actually fairly stable, with good improvement in her blood counts from when she was in the hospital (though still anemic). This may improve further once we can get her consistently tolerating foods and drinks and maybe also tolerating an iron supplement but we will continue to monitor this. Electrolytes, renal function all stable overall but does appear that she's quite dehydrated which we anticipated from her nausea and decreased intake. Let's recheck things in about 4 weeks to see how she's doing, much sooner if not improving over this time

## 2019-11-17 ENCOUNTER — Ambulatory Visit (INDEPENDENT_AMBULATORY_CARE_PROVIDER_SITE_OTHER): Payer: Medicare HMO | Admitting: Licensed Clinical Social Worker

## 2019-11-17 DIAGNOSIS — E782 Mixed hyperlipidemia: Secondary | ICD-10-CM | POA: Diagnosis not present

## 2019-11-17 DIAGNOSIS — F319 Bipolar disorder, unspecified: Secondary | ICD-10-CM

## 2019-11-17 DIAGNOSIS — F419 Anxiety disorder, unspecified: Secondary | ICD-10-CM | POA: Insufficient documentation

## 2019-11-17 DIAGNOSIS — I10 Essential (primary) hypertension: Secondary | ICD-10-CM

## 2019-11-17 DIAGNOSIS — R112 Nausea with vomiting, unspecified: Secondary | ICD-10-CM

## 2019-11-17 NOTE — Assessment & Plan Note (Signed)
Patient declines change in regimen today, continue lamictal and celexa regimen and refer to [facility] for counseling discussion

## 2019-11-17 NOTE — Assessment & Plan Note (Signed)
Stable, well controlled on prilosec. Continue current regimen

## 2019-11-17 NOTE — Assessment & Plan Note (Signed)
Following with Orthopedics, continue per their recommendations

## 2019-11-17 NOTE — Assessment & Plan Note (Signed)
BP mildly elevated today, but quite anxious, dry heaving and in pain today. Declines medication for this at this time, will continue to monitor once her overall condition improves.

## 2019-11-17 NOTE — Chronic Care Management (AMB) (Signed)
Chronic Care Management    Clinical Social Work General Note  11/17/2019 Name: Sydney Evans MRN: [id number] DOB: [birth year]  Sydney Evans is a 69 y.o. year old female who is a primary care patient of Volney American, [place]. The CCM was consulted to assist the patient with Mental Health Counseling and Resources.   [name] was given information about Chronic Care Management services today including:  1. CCM service includes personalized support from designated clinical staff supervised by her physician, including individualized plan of care and coordination with other care providers 2. 24/7 contact phone numbers for assistance for urgent and routine care needs. 3. Service will only be billed when office clinical staff spend 20 minutes or more in a month to coordinate care. 4. Only one practitioner may furnish and bill the service in a calendar month. 5. The patient may stop CCM services at any time (effective at the end of the month) by phone call to the office staff. 6. The patient will be responsible for cost sharing (co-pay) of up to 20% of the service fee (after annual deductible is met).  Patient agreed to services and verbal consent obtained.   Review of patient status, including review of consultants reports, relevant laboratory and other test results, and collaboration with appropriate care team members and the patient's provider was performed as part of comprehensive patient evaluation and provision of chronic care management services.    SDOH (Social Determinants of Health) assessments and interventions performed:  Yes    Outpatient Encounter Medications as of 11/17/2019  Medication Sig  . Carboxymethylcellulose Sodium (LUBRICANT EYE DROPS OP) Place 1 drop into both eyes daily as needed (Dry eye). Systane  . citalopram (CELEXA) 40 MG tablet Take 40 mg by mouth daily.  . diphenhydrAMINE (BENADRYL) 25 MG tablet Take 25 mg by mouth every 6 (six) hours as needed for  itching.  . fluticasone (FLONASE) 50 MCG/ACT nasal spray Place 2 sprays into both nostrils daily as needed for allergies or rhinitis.  . IBU 800 MG tablet Take 800 mg by mouth daily as needed for mild pain or moderate pain.   [name] lamoTRIgine (LAMICTAL) 200 MG tablet Take 200 mg by mouth daily.  [name] omeprazole (PRILOSEC) 40 MG capsule Take 40 mg by mouth daily.  . ondansetron (ZOFRAN-ODT) 4 MG disintegrating tablet Take 1 tablet (4 mg total) by mouth every 8 (eight) hours as needed for nausea or vomiting. 3 times daily as needed  . Travoprost, BAK Free, (TRAVATAN) 0.004 % SOLN ophthalmic solution Place 1 drop into both eyes at bedtime.   No facility-administered encounter medications on file as of 11/17/2019.    Goals Addressed    .  "I get depressed often now because I can't do for myself." (pt-stated)        Current Barriers:  . Chronic Mental Health needs related to bipolar, depression and anxiety . Limited social support . Transportation . ADL IADL limitations . Mental Health Concerns  . Social Isolation . Memory Deficits . Inability to perform ADL's independently . Inability to perform IADL's independently . Suicidal Ideation/Homicidal Ideation: No  Clinical Social Work Goal(s):  [name] Over the next 120 days, patient/caregiver will work with SW to address concerns related to gaining additional support/resource connection in order to maintain health and mental health appropriately  . Over the next 120 days, patient will demonstrate improved adherence to self care as evidenced by implementing healthy self-care into her daily routine such as: attending all medical appointments, contacting mental  health resources that were emailed, deep breathing exercises, taking time for self-reflection, taking medications as prescribed, drinking water and daily exercise to improve mobility.  . Over the next 120 days, patient will work with SW  and therapist bi-monthly by telephone or in person to reduce or  manage symptoms related to anxiety and depression.  . Over the next 120 days, patient will demonstrate improved health management independence as evidenced by implementing healthy self-care and positive support/resources into her daily routine to cope with stressors/mental health symptoms and improve overall health and well-being   Interventions: . Patient interviewed and appropriate assessments performed: brief mental health assessment . LCSW spoke with both patient and caregiver (niece) during outreach today on 11/17/19. Patient recently changed primary care providers and is in need of mental health support implementation. Patient lives alone but has niece come check on her multiple times per day. Niece or grandchildren will sometimes stay the night with patient if needed. Patient's daughter have not been involved with her care which has upset patient.  . Patient complaints of ongoing muscle weakness. Patient has had recent falls. Patient is currently receiving PT in the home. Patient lost her service dog in January of this year and is unable to take appropriate care of their new service dog that is in training so patient's niece has agreed to take in this new service dog in and allow it to visit patient during the day to help lift patient's mood. . Patient interviewed and appropriate assessments performed . Provided mental health counseling with regard to *depression and anxiety management.  Email sent to niece with available mental health resource list. Education provided.  . Provided patient with information about available socialization opportunities within patient's nearby area. Email sent to niece with information on these [facility]. Patient reports receiving socialization through her neighbor that comes over daily and by going on outings with her niece.  . Discussed plans with patient for ongoing care management follow up and provided patient with direct contact information for care  management team . Advised patient to check email and to contact CCM LCSW for any future social work related needs.  . Assisted patient/caregiver with obtaining information about health plan benefits . Provided education and assistance to client regarding Advanced Directives. . Provided education to patient/caregiver about Hospice and/or Palliative Care services . Referred patient to [facility] (mental health provider) for long term follow up and therapy/counseling . Brief CBTprovided during session today.   Patient Self Care Activities:  . Attends all scheduled provider appointments . Calls provider office for new concerns or questions . Ability for insight . Motivation for treatment . Strong family or social support  Patient Coping Strengths:  . Supportive Relationships . Hopefulness . Self Advocate . Able to Communicate Effectively  Patient Self Care Deficits:  . Lacks social connections . Unable to perform ADLs independently . Unable to perform IADLs independently  Initial goal documentation     Follow Up Plan: SW will follow up with patient by phone over the next 20 days       Eula Fried, [place], MSW, [email or web address] Phone: [phone]

## 2019-11-17 NOTE — Assessment & Plan Note (Signed)
Declines referral to specialist or change in regimen. Agreeable to referral to [facility] for initial counseling. Referral generated. Continue lamictal and celexa for now

## 2019-11-17 NOTE — Assessment & Plan Note (Addendum)
Stable with no new neurologic deficits. Continue to monitor, lifestyle habits reviewed and will watch lipids and BPs closely

## 2019-11-18 ENCOUNTER — Emergency Department
Admission: EM | Admit: 2019-11-18 | Discharge: 2019-11-18 | Disposition: A | Discharge status: Home / Self Care | Payer: Medicare HMO | Attending: Emergency Medicine | Admitting: Emergency Medicine

## 2019-11-18 ENCOUNTER — Other Ambulatory Visit: Payer: Self-pay

## 2019-11-18 ENCOUNTER — Emergency Department: Payer: Medicare HMO

## 2019-11-18 DIAGNOSIS — R42 Dizziness and giddiness: Secondary | ICD-10-CM | POA: Insufficient documentation

## 2019-11-18 DIAGNOSIS — I10 Essential (primary) hypertension: Secondary | ICD-10-CM | POA: Insufficient documentation

## 2019-11-18 DIAGNOSIS — R112 Nausea with vomiting, unspecified: Secondary | ICD-10-CM

## 2019-11-18 DIAGNOSIS — I2510 Atherosclerotic heart disease of native coronary artery without angina pectoris: Secondary | ICD-10-CM | POA: Diagnosis not present

## 2019-11-18 LAB — COMPREHENSIVE METABOLIC PANEL
ALT: 10 U/L (ref 0–44)
AST: 23 U/L (ref 15–41)
Albumin: 2.8 g/dL — ABNORMAL LOW (ref 3.5–5.0)
Alkaline Phosphatase: 94 U/L (ref 38–126)
Anion gap: 11 (ref 5–15)
BUN: 6 mg/dL — ABNORMAL LOW (ref 8–23)
CO2: 20 mmol/L — ABNORMAL LOW (ref 22–32)
Calcium: 8.3 mg/dL — ABNORMAL LOW (ref 8.9–10.3)
Chloride: 108 mmol/L (ref 98–111)
Creatinine, Ser: 1.05 mg/dL — ABNORMAL HIGH (ref 0.44–1.00)
GFR calc Af Amer: >60 mL/min (ref >60)
GFR calc non Af Amer: 55 mL/min — ABNORMAL LOW (ref >60)
Glucose, Bld: 110 mg/dL — ABNORMAL HIGH (ref 70–99)
Potassium: 4.8 mmol/L (ref 3.5–5.1)
Sodium: 139 mmol/L (ref 135–145)
Total Bilirubin: 0.9 mg/dL (ref 0.3–1.2)
Total Protein: 6 g/dL — ABNORMAL LOW (ref 6.5–8.1)

## 2019-11-18 LAB — URINALYSIS, COMPLETE (UACMP) WITH MICROSCOPIC
Bacteria, UA: NONE SEEN
Bilirubin Urine: NEGATIVE
Glucose, UA: NEGATIVE mg/dL
Hgb urine dipstick: NEGATIVE
Ketones, ur: NEGATIVE mg/dL
Leukocytes,Ua: NEGATIVE
Nitrite: NEGATIVE
Protein, ur: NEGATIVE mg/dL
Specific Gravity, Urine: 1.005 (ref 1.005–1.030)
pH: 7 (ref 5.0–8.0)

## 2019-11-18 LAB — CBC
HCT: 30 % — ABNORMAL LOW (ref 36.0–46.0)
Hemoglobin: 9.6 g/dL — ABNORMAL LOW (ref 12.0–15.0)
MCH: 24.1 pg — ABNORMAL LOW (ref 26.0–34.0)
MCHC: 32 g/dL (ref 30.0–36.0)
MCV: 75.2 fL — ABNORMAL LOW (ref 80.0–100.0)
Platelets: 431 10*3/uL — ABNORMAL HIGH (ref 150–400)
RBC: 3.99 MIL/uL (ref 3.87–5.11)
RDW: 20.3 % — ABNORMAL HIGH (ref 11.5–15.5)
WBC: 7.4 10*3/uL (ref 4.0–10.5)
nRBC: 0 % (ref 0.0–0.2)

## 2019-11-18 LAB — LIPASE, BLOOD: Lipase: 23 U/L (ref 11–51)

## 2019-11-18 MED ORDER — SODIUM CHLORIDE 0.9% FLUSH: 3.0000 mL | Freq: Once | INTRAVENOUS | Status: DC

## 2019-11-18 MED ORDER — ONDANSETRON 4 MG PO TBDP
4.0000 mg | ORAL_TABLET | Freq: Three times a day (TID) | ORAL | 0 refills | Status: DC | PRN
Start: 2019-11-18 — End: 2020-09-18

## 2019-11-18 MED ORDER — ONDANSETRON HCL 4 MG/2ML IJ SOLN
4.0000 mg | Freq: Once | INTRAMUSCULAR | Status: AC | PRN
  Administered 2019-11-18: 4 mg via INTRAVENOUS
  Filled 2019-11-18: qty 2

## 2019-11-18 NOTE — ED Provider Notes (Signed)
ER Provider Note       Time seen: 11:45 AM    I have reviewed the vital signs and the nursing notes.  HISTORY   Chief Complaint Emesis    HPI Sydney Evans is a 69 y.o. female with a history of anemia, arthritis, bipolar disorder, coronary artery disease, depression, hyperlipidemia, migraines, CVA who presents today for nausea and dry heaves since her shoulder surgery in March.  Patient states she received Zofran on arrival here and feels much better.  Past Medical History:  Diagnosis Date  . Anemia   . Anxiety   . Arthritis   . Barrett's esophagus   . Bipolar disorder ([place])   . 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 Blood clot in vein   . Cancer ([place])    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  . Carotid arterial disease ([place])   . Contracture of joint of upper arm   . Depression   . Elevated lipids   . GERD (gastroesophageal reflux disease)   . Hypertension   . Migraine   . Osteoporosis   . Poor balance   . Sinus congestion   . Stroke ([place])    x 2  . TIA (transient ischemic attack)   . Vertigo     Past Surgical History:  Procedure Laterality Date  . ABDOMINAL HYSTERECTOMY    . BACK SURGERY    . BREAST BIOPSY Left   . CARPAL TUNNEL RELEASE Bilateral   . CATARACT EXTRACTION W/ INTRAOCULAR LENS  IMPLANT, BILATERAL Bilateral   . CHOLECYSTECTOMY    . COLONOSCOPY WITH PROPOFOL N/A 04/09/2018   Procedure: COLONOSCOPY WITH PROPOFOL;  Surgeon: Jonathon Bellows, MD;  Location: [facility] ENDOSCOPY;  Service: Gastroenterology;  Laterality: N/A;  . crystal cyst removed on left foot    . ESOPHAGOGASTRODUODENOSCOPY (EGD) WITH PROPOFOL N/A 04/09/2018   Procedure: ESOPHAGOGASTRODUODENOSCOPY (EGD) WITH PROPOFOL;  Surgeon: Jonathon Bellows, MD;  Location: [facility] ENDOSCOPY;  Service: Gastroenterology;  Laterality: N/A;  . EYE SURGERY    . femoral fx    . FOOT SURGERY    . GIVENS CAPSULE STUDY N/A 06/16/2018   Procedure: GIVENS CAPSULE STUDY;  Surgeon: Jonathon Bellows, MD;  Location: [facility] ENDOSCOPY;  Service:  Gastroenterology;  Laterality: N/A;  . JOINT REPLACEMENT    . KNEE SURGERY    . ORIF PERIPROSTHETIC FRACTURE Right 10/07/2019   Procedure: OPEN REDUCTION INTERNAL FIXATION (ORIF) PERIPROSTHETIC FRACTURE;  Surgeon: Corky Mull, MD;  Location: [facility] ORS;  Service: Orthopedics;  Laterality: Right;  . TONSILLECTOMY    . TOTAL KNEE ARTHROPLASTY Bilateral   . TOTAL SHOULDER ARTHROPLASTY Left 08/01/2016   Procedure: TOTAL SHOULDER ARTHROPLASTY;  Surgeon: Corky Mull, MD;  Location: [facility] ORS;  Service: Orthopedics;  Laterality: Left;  . TOTAL SHOULDER ARTHROPLASTY Right 09/30/2019   Procedure: TOTAL SHOULDER ARTHROPLASTY;  Surgeon: Corky Mull, MD;  Location: [facility] ORS;  Service: Orthopedics;  Laterality: Right;    Allergies Hydroxyzine, Gabapentin, Hydrocodone, Ketorolac, Percocet [oxycodone-acetaminophen], and Toradol [ketorolac tromethamine]  Review of Systems Constitutional: Negative for fever. Cardiovascular: Negative for chest pain. Respiratory: Negative for shortness of breath. Gastrointestinal: Negative for abdominal pain, positive for nausea vomiting Musculoskeletal: Negative for back pain. Skin: Negative for rash. Neurological: Negative for headaches, focal weakness or numbness.  Positive for dizziness  All systems negative/normal/unremarkable except as stated in the HPI  ____________________________________________   PHYSICAL EXAM:  VITAL SIGNS: Vitals:   11/18/19 0918  BP: (!) 148/106  Pulse: 65  Resp: 17  SpO2: 99%  Constitutional: Alert and oriented. Well appearing and in no distress. Eyes: Conjunctivae are normal. Normal extraocular movements. ENT      Head: Normocephalic and atraumatic.      Nose: No congestion/rhinnorhea.      Mouth/Throat: Mucous membranes are moist.      Neck: No stridor. Cardiovascular: Normal rate, regular rhythm. No murmurs, rubs, or gallops. Respiratory: Normal respiratory effort without tachypnea nor retractions. Breath sounds are  clear and equal bilaterally. No wheezes/rales/rhonchi. Gastrointestinal: Soft and nontender. Normal bowel sounds Musculoskeletal: Nontender with normal range of motion in extremities. No lower extremity tenderness nor edema. Neurologic:  Normal speech and language. No gross focal neurologic deficits are appreciated.  Skin:  Skin is warm, dry and intact. No rash noted. Psychiatric: Speech and behavior are normal.   ____________________________________________   LABS (pertinent positives/negatives)  Labs Reviewed  COMPREHENSIVE METABOLIC PANEL - Abnormal; Notable for the following components:      Result Value   CO2 20 (*)    Glucose, Bld 110 (*)    BUN 6 (*)    Creatinine, Ser 1.05 (*)    Calcium 8.3 (*)    Total Protein 6.0 (*)    Albumin 2.8 (*)    GFR calc non Af Amer 55 (*)    All other components within normal limits  CBC - Abnormal; Notable for the following components:   Hemoglobin 9.6 (*)    HCT 30.0 (*)    MCV 75.2 (*)    MCH 24.1 (*)    RDW 20.3 (*)    Platelets 431 (*)    All other components within normal limits  LIPASE, BLOOD  URINALYSIS, COMPLETE (UACMP) WITH MICROSCOPIC    RADIOLOGY  Ct head IMPRESSION: No acute CT finding. Generalized volume loss. 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   DIFFERENTIAL DIAGNOSIS  Dizziness, vertigo, dehydration, electrolyte abnormality, anemia, gastroenteritis  ASSESSMENT AND PLAN  Dizziness, nausea   Plan: The patient had presented for dizziness and nausea which has resolved now. Patient's labs were overall unremarkable and stable compared to prior.  She will be discharged home with Zofran ODT.  Lenise Arena MD    Note: This note was generated in part or whole with voice recognition software. Voice recognition is usually quite accurate but there are transcription errors that can and very often do occur. I apologize for any typographical errors that  were not detected and corrected.     Earleen Newport, MD 11/18/19 1301

## 2019-11-18 NOTE — ED Triage Notes (Signed)
Pt c/o having issues with nausea with dry heaves since her second shoulder surgery in march, states she started having dizziness with nausea and dry heaves this morning around 4am, pt is constant dry heaving in triage with no noted emesis.

## 2019-11-18 NOTE — ED Notes (Signed)
Discharge paperwork reviewed with pt who verbalized understanding. Pt signed physical discharge form. Pt transported to lobby by wheelchair and to be transported home by daughter that is here with pt.

## 2019-11-22 ENCOUNTER — Encounter: Payer: Self-pay | Admitting: Family Medicine

## 2019-11-22 ENCOUNTER — Other Ambulatory Visit: Payer: Self-pay

## 2019-11-22 ENCOUNTER — Telehealth: Payer: Self-pay | Admitting: Family Medicine

## 2019-11-22 ENCOUNTER — Ambulatory Visit (INDEPENDENT_AMBULATORY_CARE_PROVIDER_SITE_OTHER): Payer: Medicare HMO | Admitting: Family Medicine

## 2019-11-22 VITALS — BP 146/73 | HR 63 | Temp 99.1°F | Wt 141.0 lb

## 2019-11-22 DIAGNOSIS — R112 Nausea with vomiting, unspecified: Secondary | ICD-10-CM

## 2019-11-22 NOTE — Telephone Encounter (Signed)
Addressed at today's OV  Copied from [place]. Topic: General - Other >> Nov 18, 2019  2:54 PM Celene Kras wrote: Reason for CRM:  Joni Reining, pts niece, called stating that the pt was seen in the ED. She is requesting to have pcp look at pts chart and decide if the pt should be seen sooner than her already scheduled appt. She states that the pt had a dry heaving spell this morning and that she was very weak. Please advise. >> Nov 20, 2019  1:09 PM Stark Klein wrote: Does pt need a hospital follow up and 4 week fu or can one be skipped? Please advise.

## 2019-11-22 NOTE — Progress Notes (Signed)
BP (!) 146/73    Pulse 63    Temp 99.1 F (37.3 C) (Oral)    Wt 141 lb (64 kg)    SpO2 96%    BMI 26.64 kg/m    Subjective:    Patient ID: Sydney Evans, female    DOB: May 17, 1950, 69 y.o.   MRN: [id number]  HPI: Sydney Evans is a 69 y.o. female  Chief Complaint  Patient presents with   Nausea    x a week. went to ER last Thursday   Presenting today following up on intractable N/V that she's been experiencing for several months since having an orthopedic procedure done. Has been taking zofran as needed with minimal relief of sxs. Barely tolerating PO and feeling week, defeated and depressed because of this. Per record review, used to be followed by [facility] for iron deficiency anemia and fhx of colon cancer. Has known sigmoid diverticulosis and possible hx of Barretts esophagus but otherwise no known GI issues. Takes PPI daily for her reflux. Last colonoscopy appears to have been 2019. Denies stool changes, melena. Recent blood counts appeared near her baseline.   Relevant past medical, surgical, family and social history reviewed and updated as indicated. Interim medical history since our last visit reviewed. Allergies and medications reviewed and updated.  Review of Systems  Per HPI unless specifically indicated above     Objective:    BP (!) 146/73    Pulse 63    Temp 99.1 F (37.3 C) (Oral)    Wt 141 lb (64 kg)    SpO2 96%    BMI 26.64 kg/m   Wt Readings from Last 3 Encounters:  11/22/19 141 lb (64 kg)  11/18/19 165 lb (74.8 kg)  11/11/19 142 lb (64.4 kg)    Physical Exam Vitals and nursing note reviewed.  Constitutional:      Appearance: She is ill-appearing.  HENT:     Head: Atraumatic.     Right Ear: Tympanic membrane normal.     Left Ear: Tympanic membrane normal.     Nose: Nose normal.     Mouth/Throat:     Mouth: Mucous membranes are moist.     Pharynx: Oropharynx is clear.  Eyes:     Extraocular Movements: Extraocular movements intact.      Conjunctiva/sclera: Conjunctivae normal.  Cardiovascular:     Rate and Rhythm: Normal rate and regular rhythm.     Heart sounds: Normal heart sounds.  Pulmonary:     Effort: Pulmonary effort is normal.     Breath sounds: Normal breath sounds.  Abdominal:     General: Bowel sounds are normal. There is no distension.     Palpations: Abdomen is soft.     Tenderness: There is abdominal tenderness (mild epigastric ttp). There is no right CVA tenderness, left CVA tenderness or guarding.  Musculoskeletal:     Cervical back: Normal range of motion and neck supple.     Comments: In wheelchair  Skin:    General: Skin is warm and dry.  Neurological:     Mental Status: She is alert and oriented to person, place, and time.  Psychiatric:        Mood and Affect: Mood normal.        Thought Content: Thought content normal.        Judgment: Judgment normal.     Results for orders placed or performed during the hospital encounter of 11/18/19  Lipase, blood  Result Value Ref Range  Lipase 23 11 - 51 U/L  Comprehensive metabolic panel  Result Value Ref Range   Sodium 139 135 - 145 mmol/L   Potassium 4.8 3.5 - 5.1 mmol/L   Chloride 108 98 - 111 mmol/L   CO2 20 (L) 22 - 32 mmol/L   Glucose, Bld 110 (H) 70 - 99 mg/dL   BUN 6 (L) 8 - 23 mg/dL   Creatinine, Ser 1.05 (H) 0.44 - 1.00 mg/dL   Calcium 8.3 (L) 8.9 - 10.3 mg/dL   Total Protein 6.0 (L) 6.5 - 8.1 g/dL   Albumin 2.8 (L) 3.5 - 5.0 g/dL   AST 23 15 - 41 U/L   ALT 10 0 - 44 U/L   Alkaline Phosphatase 94 38 - 126 U/L   Total Bilirubin 0.9 0.3 - 1.2 mg/dL   GFR calc non Af Amer 55 (L) >60 mL/min   GFR calc Af Amer >60 >60 mL/min   Anion gap 11 5 - 15  CBC  Result Value Ref Range   WBC 7.4 4.0 - 10.5 K/uL   RBC 3.99 3.87 - 5.11 MIL/uL   Hemoglobin 9.6 (L) 12.0 - 15.0 g/dL   HCT 30.0 (L) 36 - 46 %   MCV 75.2 (L) 80.0 - 100.0 fL   MCH 24.1 (L) 26.0 - 34.0 pg   MCHC 32.0 30.0 - 36.0 g/dL   RDW 20.3 (H) 11.5 - 15.5 %   Platelets 431  (H) 150 - 400 K/uL   nRBC 0.0 0.0 - 0.2 %  Urinalysis, Complete w Microscopic  Result Value Ref Range   Color, Urine STRAW (A) YELLOW   APPearance CLEAR (A) CLEAR   Specific Gravity, Urine 1.005 1.005 - 1.030   pH 7.0 5.0 - 8.0   Glucose, UA NEGATIVE NEGATIVE mg/dL   Hgb urine dipstick NEGATIVE NEGATIVE   Bilirubin Urine NEGATIVE NEGATIVE   Ketones, ur NEGATIVE NEGATIVE mg/dL   Protein, ur NEGATIVE NEGATIVE mg/dL   Nitrite NEGATIVE NEGATIVE   Leukocytes,Ua NEGATIVE NEGATIVE   RBC / HPF 0-5 0 - 5 RBC/hpf   WBC, UA 0-5 0 - 5 WBC/hpf   Bacteria, UA NONE SEEN NONE SEEN   Squamous Epithelial / LPF 0-5 0 - 5      Assessment & Plan:   Problem List Items Addressed This Visit    None    Visit Diagnoses    Nausea and vomiting, intractability of vomiting not specified, unspecified vomiting type    -  Primary   Relevant Orders   Ambulatory referral to Gastroenterology      Intractable x several months despite IM phenergan and po zofran prn. Minimal tolerance of PO and sxs significantly impacting mood and quality of life. Will refer back to GI for further workup as outpatient and ER labs and recent imaging during ER visits unrevealing of cause so far. Increase zofran to 8 mg PO prn and push fluids in meantime. Referral marked urgent given severity of sxs.   Follow up plan: Return for as scheduled.

## 2019-12-03 ENCOUNTER — Telehealth: Payer: Self-pay | Admitting: Family Medicine

## 2019-12-03 ENCOUNTER — Ambulatory Visit: Payer: Self-pay | Admitting: Licensed Clinical Social Worker

## 2019-12-03 ENCOUNTER — Other Ambulatory Visit: Payer: Self-pay | Admitting: Family Medicine

## 2019-12-03 DIAGNOSIS — I10 Essential (primary) hypertension: Secondary | ICD-10-CM

## 2019-12-03 DIAGNOSIS — E782 Mixed hyperlipidemia: Secondary | ICD-10-CM | POA: Diagnosis not present

## 2019-12-03 DIAGNOSIS — F319 Bipolar disorder, unspecified: Secondary | ICD-10-CM

## 2019-12-03 DIAGNOSIS — R112 Nausea with vomiting, unspecified: Secondary | ICD-10-CM

## 2019-12-03 DIAGNOSIS — K219 Gastro-esophageal reflux disease without esophagitis: Secondary | ICD-10-CM

## 2019-12-03 DIAGNOSIS — F419 Anxiety disorder, unspecified: Secondary | ICD-10-CM

## 2019-12-03 NOTE — Telephone Encounter (Signed)
She needs to go to the ER if she can't keep anything down.

## 2019-12-03 NOTE — Chronic Care Management (AMB) (Signed)
Chronic Care Management    Clinical Social Work Follow Up Note  12/03/2019 Name: Sydney Evans MRN: [id number] DOB: 1950-08-17  Sydney Evans is a 69 y.o. year old female who is a primary care patient of Volney American, [place]. The CCM team was consulted for assistance with Level of Care Concerns.   Review of patient status, including review of consultants reports, other relevant assessments, and collaboration with appropriate care team members and the patient's provider was performed as part of comprehensive patient evaluation and provision of chronic care management services.    SDOH (Social Determinants of Health) assessments performed: Yes    Outpatient Encounter Medications as of 12/03/2019  Medication Sig  . Carboxymethylcellulose Sodium (LUBRICANT EYE DROPS OP) Place 1 drop into both eyes daily as needed (Dry eye). Systane  . citalopram (CELEXA) 40 MG tablet Take 40 mg by mouth daily.  . diphenhydrAMINE (BENADRYL) 25 MG tablet Take 25 mg by mouth every 6 (six) hours as needed for itching.  . fluticasone (FLONASE) 50 MCG/ACT nasal spray Place 2 sprays into both nostrils daily as needed for allergies or rhinitis.  . IBU 800 MG tablet Take 800 mg by mouth daily as needed for mild pain or moderate pain.   [name] lamoTRIgine (LAMICTAL) 200 MG tablet Take 200 mg by mouth daily.  [name] omeprazole (PRILOSEC) 40 MG capsule Take 40 mg by mouth daily.  . ondansetron (ZOFRAN ODT) 4 MG disintegrating tablet Take 1 tablet (4 mg total) by mouth every 8 (eight) hours as needed for nausea or vomiting. (Patient not taking: Reported on 11/22/2019)  . ondansetron (ZOFRAN-ODT) 4 MG disintegrating tablet Take 1 tablet (4 mg total) by mouth every 8 (eight) hours as needed for nausea or vomiting. 3 times daily as needed  . Travoprost, BAK Free, (TRAVATAN) 0.004 % SOLN ophthalmic solution Place 1 drop into both eyes at bedtime.   No facility-administered encounter medications on file as of 12/03/2019.       Goals Addressed    .  "I get depressed often now because I can't do for myself." (pt-stated)        Current Barriers:  . Chronic Mental Health needs related to bipolar, depression and anxiety . Limited social support . Transportation . ADL IADL limitations . Mental Health Concerns  . Social Isolation . Memory Deficits . Inability to perform ADL's independently . Inability to perform IADL's independently . Suicidal Ideation/Homicidal Ideation: No  Clinical Social Work Goal(s):  [name] Over the next 120 days, patient/caregiver will work with SW to address concerns related to gaining additional support/resource connection in order to maintain health and mental health appropriately  . Over the next 120 days, patient will demonstrate improved adherence to self care as evidenced by implementing healthy self-care into her daily routine such as: attending all medical appointments, contacting mental health resources that were emailed, deep breathing exercises, taking time for self-reflection, taking medications as prescribed, drinking water and daily exercise to improve mobility.  . Over the next 120 days, patient will work with SW  and therapist bi-monthly by telephone or in person to reduce or manage symptoms related to anxiety and depression.  . Over the next 120 days, patient will demonstrate improved health management independence as evidenced by implementing healthy self-care and positive support/resources into her daily routine to cope with stressors/mental health symptoms and improve overall health and well-being   Interventions: . Patient interviewed and appropriate assessments performed: brief mental health assessment . LCSW spoke with both patient and caregiver (  niece) during last outreach. Patient recently changed primary care providers and is in need of mental health support implementation. Patient lives alone but has niece come check on her multiple times per day. Niece or grandchildren will  sometimes stay the night with patient if needed. Patient's daughter have not been involved with her care which has upset patient.  . Patient complaints of ongoing muscle weakness. Patient has had recent falls. Patient is currently receiving PT in the home. Patient lost her service dog in January of this year and is unable to take appropriate care of their new service dog that is in training so patient's niece has agreed to take in this new service dog in and allow it to visit patient during the day to help lift patient's mood. . Patient interviewed and appropriate assessments performed . Provided mental health counseling with regard to *depression and anxiety management.  Email sent to niece with available mental health resource list. Education provided.  . Provided patient with information about available socialization opportunities within patient's nearby area. Email sent to niece with information on these [facility]. Patient reports receiving socialization through her neighbor that comes over daily and by going on outings with her niece.  . Discussed plans with patient for ongoing care management follow up and provided patient with direct contact information for care management team . Advised patient to check email and to contact CCM LCSW for any future social work related needs.  . Assisted patient/caregiver with obtaining information about health plan benefits . Provided education and assistance to client regarding Advanced Directives. . Provided education to patient/caregiver about Hospice and/or Palliative Care services . Referred patient to [facility] (mental health provider) for long term follow up and therapy/counseling . Brief CBTprovided during session today.  [name] UPDATE- Sydney Evans reports that patient is currently receiving PT and she is currently running errands for patient. She reports that she went by [facility] to ask for PCP to make a referral to a different GI provider because they  are unable to wait until the end of September. Niece wondered if LCSW could inquire about this with PCP. LCSW will sent in basket message. Sydney Evans reports that she is looking into hiring in home care and has been in communication with [facility]. She reports that they dropped off paperwork for PCP to sign. LCSW will also inquire with PCP about this as there are no notes in chart regarding this request. . Sydney Evans is requesting additional assistance and support due to patent's worsening health conditions. Patient had a recent ED visit and has been experiencing dry heaving after taking medication. She has been experiencing weakness, fatigue and diarrhea as well and reported today "I am tired of feeling so bad" to niece. LCSW will update referral for CCM RNCM.  Patient Self Care Activities:  . Attends all scheduled provider appointments . Calls provider office for new concerns or questions . Ability for insight . Motivation for treatment . Strong family or social support  Patient Coping Strengths:  . Supportive Relationships . Hopefulness . Self Advocate . Able to Communicate Effectively  Patient Self Care Deficits:  . Lacks social connections . Unable to perform ADLs independently . Unable to perform IADLs independently  Please see past updates related to this goal by clicking on the "Past Updates" button in the selected goal       Follow Up Plan: SW will follow up with patient by phone over the next 30 days  Eula Fried,  BSW, MSW, [email or web address] Phone: [phone]

## 2019-12-03 NOTE — Telephone Encounter (Signed)
Routing to referral coordinator to redirect referral.

## 2019-12-03 NOTE — Telephone Encounter (Signed)
Routing to provider to advise.  

## 2019-12-03 NOTE — Telephone Encounter (Signed)
Pt's niece [name] came in and stated that Pt would like a referral to a different Gi due to GI place that she was originally referred to is not seeing Pt's until September.GI directed niece to PCP to place order elsewhere.

## 2019-12-03 NOTE — Telephone Encounter (Signed)
Patient's niece notified and verbalized understanding of Dr. Durenda Age message.

## 2019-12-03 NOTE — Telephone Encounter (Signed)
PrePt is has been having diarrhea and driheaving, Pt's niece [name] came in and stated that she has called 3 times in regards to this and it is now Friday and has not head anything back, Glass blower/designer spoke with pt and is trying to see what happed and looking for a way to resolve this issues as pt is still not feeling well ,pt is still vomiting diarrehea and having diarrhea this whole week.  Pt would like a referral to a different Gi due to GI place that she was originally referred to is not seeing Pt's until September.GI directed niece to PCP to place order elsewhere.

## 2019-12-03 NOTE — Telephone Encounter (Signed)
Copied from [place] [id number]. Topic: General - Other >> Dec 03, 2019  9:12 AM Hinda Lenis D wrote: Joni Reining need to speak with someone clinical about the Pts health /  pleas advise

## 2019-12-03 NOTE — Telephone Encounter (Signed)
Per office manager provider needs to be made aware of pt Sx since Pt has been having the diarrhea and dry heaving and vomiting since Monday and has not got better.

## 2019-12-06 ENCOUNTER — Telehealth: Payer: Self-pay | Admitting: Family Medicine

## 2019-12-06 NOTE — Telephone Encounter (Signed)
Called to check the status of a request for home care for patient.  Stated she called a few weeks ago and still has not heard anything from the office.  Please advise and call to give an update at [phone]

## 2019-12-07 NOTE — Telephone Encounter (Signed)
Routing to provider  

## 2019-12-08 ENCOUNTER — Other Ambulatory Visit: Payer: Self-pay | Admitting: Gastroenterology

## 2019-12-08 DIAGNOSIS — R112 Nausea with vomiting, unspecified: Secondary | ICD-10-CM

## 2019-12-09 NOTE — Progress Notes (Signed)
[name] notified of Rachel's message. [name] stated she was at work and will return call with more information about the reason why patient is requesting home care assistance. Stated she will try to get in contact with the person who dropped off paperwork in the office to see if they can get those paper again. FYI

## 2019-12-09 NOTE — Telephone Encounter (Signed)
Tried [employer] back. We have not seen forms to fill out for this patient. Please have Anne Ng resend forms that need to be completed by [place] to fill out. I could not leave a VM as her VM box was full.

## 2019-12-09 NOTE — Telephone Encounter (Signed)
[place] calling again and is needing to have an update on these forms. She states that she is waiting on a fax or to have a call back regarding these forms. Please advise.

## 2019-12-10 ENCOUNTER — Other Ambulatory Visit: Payer: Self-pay

## 2019-12-10 ENCOUNTER — Other Ambulatory Visit
Admission: RE | Admit: 2019-12-10 | Discharge: 2019-12-10 | Disposition: A | Discharge status: Home / Self Care | Payer: Medicare HMO | Source: Ambulatory Visit | Attending: Gastroenterology | Admitting: Gastroenterology

## 2019-12-10 ENCOUNTER — Telehealth: Payer: Self-pay | Admitting: Family Medicine

## 2019-12-10 DIAGNOSIS — Z01812 Encounter for preprocedural laboratory examination: Secondary | ICD-10-CM | POA: Insufficient documentation

## 2019-12-10 DIAGNOSIS — Z20822 Contact with and (suspected) exposure to covid-19: Secondary | ICD-10-CM | POA: Diagnosis not present

## 2019-12-10 NOTE — Telephone Encounter (Signed)
[facility] is here leaving paper work, for pt to be singed,  Annet's personal number ( owner of [facility]) is  [phone] or [phone] Please call when this is done .Anne Ng states patient has been having more falls lately so she is in need of this paper work to be singed for Pt. Paper left in bin to be reviewed on 12/10/2019.

## 2019-12-10 NOTE — Telephone Encounter (Signed)
See other encounter.

## 2019-12-11 LAB — SARS CORONAVIRUS 2 (TAT 6-24 HRS): SARS Coronavirus 2: NEGATIVE

## 2019-12-14 ENCOUNTER — Encounter: Payer: Self-pay | Admitting: *Deleted

## 2019-12-14 ENCOUNTER — Encounter
Admission: RE | Disposition: A | Discharge status: Home / Self Care | Payer: Self-pay | Source: Home / Self Care | Attending: Gastroenterology

## 2019-12-14 ENCOUNTER — Ambulatory Visit
Admission: RE | Admit: 2019-12-14 | Discharge: 2019-12-14 | Disposition: A | Discharge status: Home / Self Care | Payer: Medicare HMO | Attending: Gastroenterology | Admitting: Gastroenterology

## 2019-12-14 ENCOUNTER — Other Ambulatory Visit: Payer: Self-pay

## 2019-12-14 ENCOUNTER — Ambulatory Visit: Payer: Medicare HMO | Admitting: Anesthesiology

## 2019-12-14 DIAGNOSIS — M1990 Unspecified osteoarthritis, unspecified site: Secondary | ICD-10-CM | POA: Diagnosis not present

## 2019-12-14 DIAGNOSIS — Z79899 Other long term (current) drug therapy: Secondary | ICD-10-CM | POA: Insufficient documentation

## 2019-12-14 DIAGNOSIS — K219 Gastro-esophageal reflux disease without esophagitis: Secondary | ICD-10-CM | POA: Insufficient documentation

## 2019-12-14 DIAGNOSIS — F319 Bipolar disorder, unspecified: Secondary | ICD-10-CM | POA: Diagnosis not present

## 2019-12-14 DIAGNOSIS — I10 Essential (primary) hypertension: Secondary | ICD-10-CM | POA: Diagnosis not present

## 2019-12-14 DIAGNOSIS — R1115 Cyclical vomiting syndrome unrelated to migraine: Secondary | ICD-10-CM | POA: Insufficient documentation

## 2019-12-14 DIAGNOSIS — K3189 Other diseases of stomach and duodenum: Secondary | ICD-10-CM | POA: Insufficient documentation

## 2019-12-14 DIAGNOSIS — G43909 Migraine, unspecified, not intractable, without status migrainosus: Secondary | ICD-10-CM | POA: Diagnosis not present

## 2019-12-14 DIAGNOSIS — R112 Nausea with vomiting, unspecified: Secondary | ICD-10-CM | POA: Diagnosis present

## 2019-12-14 DIAGNOSIS — Z8673 Personal history of transient ischemic attack (TIA), and cerebral infarction without residual deficits: Secondary | ICD-10-CM | POA: Diagnosis not present

## 2019-12-14 HISTORY — PX: ESOPHAGOGASTRODUODENOSCOPY (EGD) WITH PROPOFOL: SHX5813

## 2019-12-14 SURGERY — ESOPHAGOGASTRODUODENOSCOPY (EGD) WITH PROPOFOL
Anesthesia: General

## 2019-12-14 MED ORDER — SODIUM CHLORIDE 0.9 % IV SOLN: INTRAVENOUS | Status: DC

## 2019-12-14 MED ORDER — LABETALOL HCL 5 MG/ML IV SOLN
INTRAVENOUS | Status: AC
  Filled 2019-12-14: qty 4

## 2019-12-14 MED ORDER — PROPOFOL 10 MG/ML IV BOLUS
INTRAVENOUS | Status: DC | PRN
Start: 2019-12-14 — End: 2019-12-14
  Administered 2019-12-14: 50 mg via INTRAVENOUS

## 2019-12-14 MED ORDER — PROPOFOL 500 MG/50ML IV EMUL
INTRAVENOUS | Status: DC | PRN
  Administered 2019-12-14: 100 ug/kg/min via INTRAVENOUS

## 2019-12-14 MED ORDER — GLYCOPYRROLATE 0.2 MG/ML IJ SOLN
INTRAMUSCULAR | Status: DC | PRN
  Administered 2019-12-14: .1 mg via INTRAVENOUS

## 2019-12-14 MED ORDER — PROPOFOL 500 MG/50ML IV EMUL
INTRAVENOUS | Status: AC
  Filled 2019-12-14: qty 50

## 2019-12-14 MED ORDER — GLYCOPYRROLATE 0.2 MG/ML IJ SOLN
INTRAMUSCULAR | Status: AC
  Filled 2019-12-14: qty 1

## 2019-12-14 MED ORDER — LABETALOL HCL 5 MG/ML IV SOLN
10.0000 mg | Freq: Once | INTRAVENOUS | Status: AC
  Administered 2019-12-14: 10 mg via INTRAVENOUS
  Filled 2019-12-14: qty 4

## 2019-12-14 MED ORDER — LIDOCAINE HCL (PF) 2 % IJ SOLN
INTRAMUSCULAR | Status: AC
  Filled 2019-12-14: qty 5

## 2019-12-14 NOTE — Op Note (Signed)
[facility] Gastroenterology Patient Name: Sydney Evans Procedure Date: 12/14/2019 10:36 AM MRN: [id number] Account #: [id number] Date of Birth: Dec 06, 1950 Admit Type: Outpatient Age: 69 Room: [facility] ENDO ROOM 3 Gender: Female Note Status: Finalized Procedure:             Upper GI endoscopy Indications:           Nausea with vomiting, Persistent vomiting Providers:             Andrey Farmer MD, MD Referring MD:          No Local Md, MD (Referring MD) Medicines:             Monitored Anesthesia Care Complications:         No immediate complications. Estimated blood loss:                         Minimal. Procedure:             Pre-Anesthesia Assessment:                        - Prior to the procedure, a History and Physical was                         performed, and patient medications and allergies were                         reviewed. The patient is competent. The risks and                         benefits of the procedure and the sedation options and                         risks were discussed with the patient. All questions                         were answered and informed consent was obtained.                         Patient identification and proposed procedure were                         verified by the physician, the nurse, the anesthetist                         and the technician in the endoscopy suite. Mental                         Status Examination: alert and oriented. Airway                         Examination: normal oropharyngeal airway and neck                         mobility. Respiratory Examination: clear to                         auscultation. CV Examination: normal. Prophylactic  Antibiotics: The patient does not require prophylactic                         antibiotics. Prior Anticoagulants: The patient has                         taken no previous anticoagulant or antiplatelet                         agents.  ASA Grade Assessment: III - A patient with                         severe systemic disease. After reviewing the risks and                         benefits, the patient was deemed in satisfactory                         condition to undergo the procedure. The anesthesia                         plan was to use monitored anesthesia care (MAC).                         Immediately prior to administration of medications,                         the patient was re-assessed for adequacy to receive                         sedatives. The heart rate, respiratory rate, oxygen                         saturations, blood pressure, adequacy of pulmonary                         ventilation, and response to care were monitored                         throughout the procedure. The physical status of the                         patient was re-assessed after the procedure.                        After obtaining informed consent, the endoscope was                         passed under direct vision. Throughout the procedure,                         the patient's blood pressure, pulse, and oxygen                         saturations were monitored continuously. The Endoscope                         was introduced through the mouth, and advanced to the  second part of duodenum. The upper GI endoscopy was                         accomplished without difficulty. The patient tolerated                         the procedure well. Findings:      The examined esophagus was normal.      The entire examined stomach was normal. Biopsies were taken with a cold       forceps for Helicobacter pylori testing. Estimated blood loss was       minimal.      A single 11 mm submucosal nodule with a localized distribution was found       in the first portion of the duodenum. Biopsies were taken with a cold       forceps for histology. Estimated blood loss was minimal.      A single papular lesion measuring 3 mm  in diameter was found in the       duodenal bulb. Biopsies were taken with a cold forceps for histology.       Estimated blood loss was minimal. Impression:            - Normal esophagus.                        - Normal stomach. Biopsied.                        - Submucosal nodule found in the duodenum. Biopsied.                         Depending on biopsy results may need upper EUS.                        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 Biopsied. Recommendation:        - Discharge patient to home.                        - Resume previous diet.                        - Continue present medications.                        - Await pathology results.                        - Return to referring physician as previously                         scheduled. Procedure Code(s):     --- Professional ---                        [phone], Esophagogastroduodenoscopy, flexible,                         transoral; with biopsy, single or multiple Diagnosis Code(s):     ---  Professional ---                        K31.89, Other diseases of stomach and duodenum                        R11.2, Nausea with vomiting, unspecified                        E56.31, Cyclical vomiting syndrome unrelated to                         migraine CPT copyright 2019 American Medical Association. All rights reserved. The codes documented in this report are preliminary and upon coder review may  be revised to meet current compliance requirements. Andrey Farmer, MD Andrey Farmer MD, MD 12/14/2019 11:03:29 AM Number of Addenda: 0 Note Initiated On: 12/14/2019 10:36 AM Estimated Blood Loss:  Estimated blood loss was minimal.      [place]

## 2019-12-14 NOTE — Interval H&P Note (Signed)
History and Physical Interval Note:  12/14/2019 10:32 AM  Sydney Evans  has presented today for surgery, with the diagnosis of NAUSEA & VOMITING.  The various methods of treatment have been discussed with the patient and family. After consideration of risks, benefits and other options for treatment, the patient has consented to  Procedure(s): ESOPHAGOGASTRODUODENOSCOPY (EGD) WITH PROPOFOL (N/A) as a surgical intervention.  The patient's history has been reviewed, patient examined, no change in status, stable for surgery.  I have reviewed the patient's chart and labs.  Questions were answered to the patient's satisfaction.     Lesly Rubenstein  Ok to proceed with EGD/

## 2019-12-14 NOTE — Progress Notes (Signed)
MD aware about high BP. Order for 10mg  of Labetalol IV.

## 2019-12-14 NOTE — Telephone Encounter (Signed)
Form picked up by Anne Ng.

## 2019-12-14 NOTE — Progress Notes (Signed)
Dr. Randa Lynn aware of bp. OK for patient to discharge. Patient has appointment with regular medical MD tomorrow and she will inform him of the blood pressure elevation today. [name] her niece aware.

## 2019-12-14 NOTE — Anesthesia Preprocedure Evaluation (Addendum)
Anesthesia Evaluation  Patient identified by MRN, date of birth, ID band Patient awake    Reviewed: Allergy & Precautions, H&P , NPO status , Patient's Chart, lab work & pertinent test results  History of Anesthesia Complications Negative for: history of anesthetic complications  Airway Mallampati: II  TM Distance: >3 FB     Dental  (+) Upper Dentures, Lower Dentures   Pulmonary neg sleep apnea, neg COPD, Current Smoker and Patient abstained from smoking.,    breath sounds clear to auscultation       Cardiovascular hypertension, (-) angina(-) Past MI and (-) Cardiac Stents (-) dysrhythmias  Rhythm:regular Rate:Normal  Carotid stenosis H/o non-sustained VTach   Neuro/Psych  Headaches, PSYCHIATRIC DISORDERS Anxiety Depression Bipolar Disorder TIACVA    GI/Hepatic Neg liver ROS, GERD  ,Barrett's esophagus   Endo/Other  negative endocrine ROS  Renal/GU negative Renal ROS  negative genitourinary   Musculoskeletal   Abdominal   Peds  Hematology  (+) Blood dyscrasia, anemia ,   Anesthesia Other Findings Past Medical History: No date: Anemia No date: Anxiety No date: Arthritis No date: Barrett's esophagus No date: Bipolar disorder (HCC) No date: Blocked artery     Comment:  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 No date: Blood clot in vein No date: Cancer [facility])     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 No date: Carotid arterial disease (HCC) 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 No date: Depression No date: Elevated lipids No date: GERD (gastroesophageal reflux disease) No date: Hypertension No date: Migraine No date: Osteoporosis No date: Poor balance No date: Sinus congestion No date: 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 No date: TIA (transient ischemic attack) No date: Vertigo  Past Surgical History: No date: ABDOMINAL HYSTERECTOMY No date: BACK SURGERY No date: BREAST BIOPSY; Left No date: CARPAL TUNNEL RELEASE; Bilateral No date:  CATARACT EXTRACTION W/ INTRAOCULAR LENS  IMPLANT, BILATERAL;  Bilateral No date: CHOLECYSTECTOMY 04/09/2018: COLONOSCOPY WITH PROPOFOL; N/A     Comment:  Procedure: COLONOSCOPY WITH PROPOFOL;  Surgeon: Jonathon Bellows, MD;  Location: [facility] ENDOSCOPY;  Service:               Gastroenterology;  Laterality: N/A; No date: crystal cyst removed on left foot 04/09/2018: ESOPHAGOGASTRODUODENOSCOPY (EGD) WITH PROPOFOL; N/A     Comment:  Procedure: ESOPHAGOGASTRODUODENOSCOPY (EGD) WITH               PROPOFOL;  Surgeon: Jonathon Bellows, MD;  Location: [facility]               ENDOSCOPY;  Service: Gastroenterology;  Laterality: N/A; No date: EYE SURGERY No date: femoral fx No date: FOOT SURGERY 06/16/2018: GIVENS CAPSULE STUDY; N/A     Comment:  Procedure: GIVENS CAPSULE STUDY;  Surgeon: Jonathon Bellows,               MD;  Location: [facility] ENDOSCOPY;  Service:               Gastroenterology;  Laterality: N/A; No date: JOINT REPLACEMENT No date: KNEE SURGERY 10/07/2019: ORIF PERIPROSTHETIC FRACTURE; Right     Comment:  Procedure: OPEN REDUCTION INTERNAL FIXATION (ORIF)               PERIPROSTHETIC FRACTURE;  Surgeon: Corky Mull, MD;                Location: [facility] ORS;  Service: Orthopedics;  Laterality:  Right; No date: TONSILLECTOMY No date: TOTAL KNEE ARTHROPLASTY; Bilateral 08/01/2016: TOTAL SHOULDER ARTHROPLASTY; Left     Comment:  Procedure: TOTAL SHOULDER ARTHROPLASTY;  Surgeon: Corky Mull, MD;  Location: [facility] ORS;  Service: Orthopedics;                Laterality: Left; 09/30/2019: TOTAL SHOULDER ARTHROPLASTY; Right     Comment:  Procedure: TOTAL SHOULDER ARTHROPLASTY;  Surgeon: Corky Mull, MD;  Location: [facility] ORS;  Service: Orthopedics;                Laterality: Right;     Reproductive/Obstetrics negative OB ROS                           Anesthesia Physical Anesthesia Plan  ASA: III  Anesthesia Plan: General   Post-op  Pain Management:    Induction:   PONV Risk Score and Plan: Propofol infusion and TIVA  Airway Management Planned: Nasal Cannula  Additional Equipment:   Intra-op Plan:   Post-operative Plan:   Informed Consent: I have reviewed the patients History and Physical, chart, labs and discussed the procedure including the risks, benefits and alternatives for the proposed anesthesia with the patient or authorized representative who has indicated his/her understanding and acceptance.     Dental Advisory Given  Plan Discussed with: Anesthesiologist, CRNA and Surgeon  Anesthesia Plan Comments:         Anesthesia Quick Evaluation

## 2019-12-14 NOTE — H&P (Addendum)
Outpatient short stay form Pre-procedure 12/14/2019 10:22 AM Sydney Miyamoto MD, MPH  Primary Physician: Sydney Evans  Reason for visit:  Nausea/Vomiting  History of present illness:   69 y/o lady with chronic nausea/vomiting here for EGD. Developed SBO after shoulder surgery. No blood thinners.   Current Facility-Administered Medications:  .  0.9 %  sodium chloride infusion, , Intravenous, Continuous, Billy Turvey, Hilton Cork, MD  Medications Prior to Admission  Medication Sig Dispense Refill Last Dose  . Carboxymethylcellulose Sodium (LUBRICANT EYE DROPS OP) Place 1 drop into both eyes daily as needed (Dry eye). Systane   Past Week at Unknown time  . citalopram (CELEXA) 40 MG tablet Take 40 mg by mouth daily.   12/13/2019 at Unknown time  . diphenhydrAMINE (BENADRYL) 25 MG tablet Take 25 mg by mouth every 6 (six) hours as needed for itching.   Past Week at Unknown time  . fluticasone (FLONASE) 50 MCG/ACT nasal spray Place 2 sprays into both nostrils daily as needed for allergies or rhinitis.   12/13/2019 at Unknown time  . IBU 800 MG tablet Take 800 mg by mouth daily as needed for mild pain or moderate pain.    12/13/2019 at Unknown time  . lamoTRIgine (LAMICTAL) 200 MG tablet Take 200 mg by mouth daily.   12/13/2019 at Unknown time  . omeprazole (PRILOSEC) 40 MG capsule Take 40 mg by mouth daily.   12/13/2019 at Unknown time  . ondansetron (ZOFRAN-ODT) 4 MG disintegrating tablet Take 1 tablet (4 mg total) by mouth every 8 (eight) hours as needed for nausea or vomiting. 3 times daily as needed 90 tablet 0 12/13/2019 at Unknown time  . Travoprost, BAK Free, (TRAVATAN) 0.004 % SOLN ophthalmic solution Place 1 drop into both eyes at bedtime.   12/13/2019 at Unknown time  . ondansetron (ZOFRAN ODT) 4 MG disintegrating tablet Take 1 tablet (4 mg total) by mouth every 8 (eight) hours as needed for nausea or vomiting. (Patient not taking: Reported on 11/22/2019) 20 tablet 0      Allergies  Allergen Reactions  .  Hydroxyzine Hives and Rash  . Gabapentin     Dizzy and confusion  . Dilaudid [Hydromorphone] Itching  . Hydrocodone Hives and Rash    "terrible scratching"  Make take with benadryl  . Ketorolac Rash     May take with benadryl  . Percocet [Oxycodone-Acetaminophen] Itching and Rash    May take with benadryl    . Toradol [Ketorolac Tromethamine] Rash    May take with benadryl     Past Medical History:  Diagnosis Date  . Anemia   . Anxiety   . Arthritis   . Barrett's esophagus   . Bipolar disorder ([place])   . 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 Blood clot in vein   . Cancer ([place])    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  . Carotid arterial disease ([place])   . Contracture of joint of upper arm   . Depression   . Elevated lipids   . GERD (gastroesophageal reflux disease)   . Hypertension   . Migraine   . Osteoporosis   . Poor balance   . Sinus congestion   . 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 TIA (transient ischemic attack)   . Vertigo     Review of systems:  Otherwise negative.    Physical Exam  Gen: Alert, oriented. Appears stated age.  HEENT: [place]/AT. PERRLA. Lungs: no respiratory distress Abd: soft, benign, no masses. BS+  Ext: No edema. Pulses 2+    Planned procedures: Proceed with EGD. The patient understands the nature of the planned procedure, indications, risks, alternatives and potential complications including but not limited to bleeding, infection, perforation, damage to internal organs and possible oversedation/side effects from anesthesia. The patient agrees and gives consent to proceed.  Please refer to procedure notes for findings, recommendations and patient disposition/instructions.     Sydney Miyamoto MD, MPH Gastroenterology 12/14/2019  10:22 AM

## 2019-12-14 NOTE — Telephone Encounter (Signed)
Form filled out and placed in providers folder for signature.  

## 2019-12-14 NOTE — Telephone Encounter (Signed)
Called and left Anne Ng a VM letting her know that paperwork was complete and ready for pick up.

## 2019-12-14 NOTE — Anesthesia Procedure Notes (Signed)
Procedure Name: MAC Date/Time: 12/14/2019 10:42 AM Performed by: Genevie Ann, CRNA

## 2019-12-14 NOTE — Transfer of Care (Signed)
Immediate Anesthesia Transfer of Care Note  Patient: Sydney Evans  Procedure(s) Performed: ESOPHAGOGASTRODUODENOSCOPY (EGD) WITH PROPOFOL (N/A )  Patient Location: PACU and Endoscopy Unit  Anesthesia Type:MAC  Level of Consciousness: oriented  Airway & Oxygen Therapy: Patient Spontanous Breathing  Post-op Assessment: Report given to RN and Post -op Vital signs reviewed and stable  Post vital signs: Reviewed and stable  Last Vitals:  Vitals Value Taken Time  BP 158/62 12/14/19 1058  Temp    Pulse 57 12/14/19 1059  Resp 15 12/14/19 1059  SpO2 96 % 12/14/19 1059  Vitals shown include unvalidated device data.  Last Pain:  Vitals:   12/14/19 1058  TempSrc:   PainSc: 0-No pain         Complications: No complications documented.

## 2019-12-15 ENCOUNTER — Ambulatory Visit: Payer: Self-pay

## 2019-12-15 ENCOUNTER — Ambulatory Visit (INDEPENDENT_AMBULATORY_CARE_PROVIDER_SITE_OTHER): Payer: Medicare HMO | Admitting: Family Medicine

## 2019-12-15 ENCOUNTER — Encounter: Payer: Self-pay | Admitting: Family Medicine

## 2019-12-15 VITALS — BP 161/75 | HR 50 | Temp 98.4°F | Wt 144.0 lb

## 2019-12-15 DIAGNOSIS — E440 Moderate protein-calorie malnutrition: Secondary | ICD-10-CM | POA: Diagnosis not present

## 2019-12-15 DIAGNOSIS — R110 Nausea: Secondary | ICD-10-CM | POA: Diagnosis not present

## 2019-12-15 DIAGNOSIS — R350 Frequency of micturition: Secondary | ICD-10-CM

## 2019-12-15 DIAGNOSIS — I10 Essential (primary) hypertension: Secondary | ICD-10-CM

## 2019-12-15 DIAGNOSIS — R531 Weakness: Secondary | ICD-10-CM | POA: Diagnosis not present

## 2019-12-15 LAB — UA/M W/RFLX CULTURE, ROUTINE
Bilirubin, UA: NEGATIVE
Glucose, UA: NEGATIVE
Ketones, UA: NEGATIVE
Leukocytes,UA: NEGATIVE
Nitrite, UA: NEGATIVE
Protein,UA: NEGATIVE
RBC, UA: NEGATIVE
Specific Gravity, UA: 1.01 (ref 1.005–1.030)
Urobilinogen, Ur: 0.2 mg/dL (ref 0.2–1.0)
pH, UA: 7 (ref 5.0–7.5)

## 2019-12-15 MED ORDER — PROMETHAZINE HCL 12.5 MG PO TABS
12.5000 mg | ORAL_TABLET | Freq: Three times a day (TID) | ORAL | 0 refills | Status: DC | PRN
Start: 2019-12-15 — End: 2020-01-20

## 2019-12-15 NOTE — Patient Instructions (Signed)
[facility]: [phone] 

## 2019-12-15 NOTE — Anesthesia Postprocedure Evaluation (Signed)
Anesthesia Post Note  Patient: Ilda Laskin  Procedure(s) Performed: ESOPHAGOGASTRODUODENOSCOPY (EGD) WITH PROPOFOL (N/A )  Patient location during evaluation: PACU Anesthesia Type: General Level of consciousness: awake and alert Pain management: pain level controlled Vital Signs Assessment: post-procedure vital signs reviewed and stable Respiratory status: spontaneous breathing, nonlabored ventilation and respiratory function stable Cardiovascular status: blood pressure returned to baseline and stable Postop Assessment: no apparent nausea or vomiting Anesthetic complications: no   No complications documented.   Last Vitals:  Vitals:   12/14/19 1145 12/14/19 1202  BP: (!) 170/74 (!) 191/68  Pulse: (!) 50 (!) 49  Resp: 19 17  Temp:    SpO2: 97% 100%    Last Pain:  Vitals:   12/14/19 1202  TempSrc:   PainSc: 0-No pain                 Brett Canales Ailah Barna

## 2019-12-15 NOTE — Progress Notes (Signed)
BP (!) 161/75   Pulse (!) 50   Temp 98.4 F (36.9 C) (Oral)   Wt 144 lb (65.3 kg)   SpO2 98%   BMI 26.34 kg/m    Subjective:    Patient ID: Sydney Evans, female    DOB: 1950-12-27, 69 y.o.   MRN: [id number]  HPI: Sydney Evans is a 69 y.o. female  Chief Complaint  Patient presents with  . Nausea  . Shaking    armas, legs and abdomen   Here today following up on her ongoing nausea, vomiting, weakness, dizziness, decreased ability to perform ADLs. Working closely with GI on her severe chronic nausea and vomiting. Just had an EGD and has a CT abdomen pelvis scheduled for tomorrow. Zofran seems to be making things worse and not better. Barely able to hold any PO down. Per niece who is a primary caregiver she used to be completely independent and typically happy person until this all happened several months ago. Now requesting home health assistance to help her with daily tasks, PT OT, etc.   Urinary frequency, particularly overnight the past few weeks. Denies dysuria, hematuria, fever.   Relevant past medical, surgical, family and social history reviewed and updated as indicated. Interim medical history since our last visit reviewed. Allergies and medications reviewed and updated.  Review of Systems  Per HPI unless specifically indicated above     Objective:    BP (!) 161/75   Pulse (!) 50   Temp 98.4 F (36.9 C) (Oral)   Wt 144 lb (65.3 kg)   SpO2 98%   BMI 26.34 kg/m   Wt Readings from Last 3 Encounters:  12/15/19 144 lb (65.3 kg)  12/14/19 143 lb (64.9 kg)  11/22/19 141 lb (64 kg)    Physical Exam Vitals and nursing note reviewed.  Constitutional:      Appearance: Normal appearance. She is not ill-appearing.  HENT:     Head: Atraumatic.     Mouth/Throat:     Mouth: Mucous membranes are moist.     Pharynx: Oropharynx is clear.  Eyes:     Extraocular Movements: Extraocular movements intact.     Conjunctiva/sclera: Conjunctivae normal.  Cardiovascular:       Rate and Rhythm: Normal rate and regular rhythm.     Heart sounds: Normal heart sounds.  Pulmonary:     Effort: Pulmonary effort is normal.     Breath sounds: Normal breath sounds.  Abdominal:     General: Bowel sounds are normal.     Palpations: Abdomen is soft.     Tenderness: There is abdominal tenderness (generalized ttp, mild). There is no guarding.  Musculoskeletal:     Cervical back: Normal range of motion and neck supple.     Comments: In wheelchair  Skin:    General: Skin is warm and dry.  Neurological:     Mental Status: She is alert and oriented to person, place, and time.  Psychiatric:        Mood and Affect: Mood normal.        Thought Content: Thought content normal.        Judgment: Judgment normal.     Results for orders placed or performed in visit on 12/15/19  UA/M w/rflx Culture, Routine   Specimen: Urine   Urine  Result Value Ref Range   Specific Gravity, UA 1.010 1.005 - 1.030   pH, UA 7.0 5.0 - 7.5   Color, UA Yellow Yellow   Appearance Ur Clear  Clear   Leukocytes,UA Negative Negative   Protein,UA Negative Negative/Trace   Glucose, UA Negative Negative   Ketones, UA Negative Negative   RBC, UA Negative Negative   Bilirubin, UA Negative Negative   Urobilinogen, Ur 0.2 0.2 - 1.0 mg/dL   Nitrite, UA Negative Negative      Assessment & Plan:   Problem List Items Addressed This Visit      Cardiovascular and Mediastinum   Essential hypertension    Mildly elevated today and at times when checked outside clinic, but at other times quite low. Will avoid increasing medication regimen due to risk of hypotension/falls particularly given her poor PO intake due to GI issues. Continue to monitor closely       Other Visit Diagnoses    Chronic nausea    -  Primary   Awaiting results of workup. Zofran not helping, trial low dose phenergan. Sedation precautions reviewed at length   Urinary frequency       Check U/A, tx if needed for infection. continue  to monitor   Relevant Orders   UA/M w/rflx Culture, Routine (Completed)   Weakness       Home health ordered for ADL assistance as well as PT/OT if able   Relevant Orders   Ambulatory referral to [facility]   Moderate protein-calorie malnutrition ([place])       Discussed continued efforts toward supplementation, working with GI to control her chronic N/V   Relevant Orders   Ambulatory referral to [facility]       Follow up plan: Return in about 3 months (around 03/16/2020) for follow up chronic conditions.

## 2019-12-15 NOTE — Chronic Care Management (AMB) (Signed)
  Care Management   Follow Up Note   12/15/2019 Name: Sydney Evans MRN: [id number] DOB: 1951/02/15  Referred by: Volney American, PA-C Reason for referral : Care Coordination   Sydney Evans is a 69 y.o. year old female who is a primary care patient of Volney American, [place]. The care management team was consulted for assistance with care management and care coordination needs.    Review of patient status, including review of consultants reports, relevant laboratory and other test results, and collaboration with appropriate care team members and the patient's provider was performed as part of comprehensive patient evaluation and provision of chronic care management services.    LCSW completed CCM outreach attempt today but was unable to reach patient successfully. A HIPPA compliant voice message was left encouraging patient to return call once available. LCSW rescheduled CCM SW appointment as well.  A HIPPA compliant phone message was left for the patient providing contact information and requesting a return call.   Eula Fried, BSW, MSW, [facility] Care Management [email or web address] Phone: [phone]

## 2019-12-16 ENCOUNTER — Ambulatory Visit
Admission: RE | Admit: 2019-12-16 | Discharge: 2019-12-16 | Disposition: A | Discharge status: Home / Self Care | Payer: Medicare HMO | Source: Ambulatory Visit | Attending: Gastroenterology | Admitting: Gastroenterology

## 2019-12-16 ENCOUNTER — Encounter: Payer: Self-pay | Admitting: Gastroenterology

## 2019-12-16 ENCOUNTER — Other Ambulatory Visit: Payer: Self-pay

## 2019-12-16 DIAGNOSIS — R634 Abnormal weight loss: Secondary | ICD-10-CM | POA: Diagnosis not present

## 2019-12-16 DIAGNOSIS — R112 Nausea with vomiting, unspecified: Secondary | ICD-10-CM | POA: Insufficient documentation

## 2019-12-16 DIAGNOSIS — I700 Atherosclerosis of aorta: Secondary | ICD-10-CM | POA: Insufficient documentation

## 2019-12-16 MED ORDER — IOHEXOL 300 MG/ML  SOLN
100.0000 mL | Freq: Once | INTRAMUSCULAR | Status: AC | PRN
  Administered 2019-12-16: 100 mL via INTRAVENOUS

## 2019-12-17 NOTE — Assessment & Plan Note (Signed)
Mildly elevated today and at times when checked outside clinic, but at other times quite low. Will avoid increasing medication regimen due to risk of hypotension/falls particularly given her poor PO intake due to GI issues. Continue to monitor closely

## 2019-12-20 LAB — SURGICAL PATHOLOGY

## 2019-12-31 ENCOUNTER — Telehealth: Payer: Self-pay | Admitting: General Practice

## 2019-12-31 ENCOUNTER — Telehealth: Payer: Self-pay

## 2019-12-31 NOTE — Telephone Encounter (Signed)
  Chronic Care Management   Outreach Note  12/31/2019 Name: Suleika Donavan MRN: [id number] DOB: July 01, 1950  Referred by: Volney American, PA-C Reason for referral : Chronic Care Management [facility] Initial outreach for Chronic Disease Management and Care Coordination Needs)   An unsuccessful telephone outreach was attempted today. The patient was referred to the case management team for assistance with care management and care coordination.   Follow Up Plan: A HIPPA compliant phone message was left for the patient providing contact information and requesting a return call.   Noreene Larsson RN, MSN, [facility] Mobile: [phone]

## 2020-01-03 ENCOUNTER — Ambulatory Visit (INDEPENDENT_AMBULATORY_CARE_PROVIDER_SITE_OTHER): Payer: Medicare HMO

## 2020-01-03 VITALS — Ht 62.0 in | Wt 145.0 lb

## 2020-01-03 DIAGNOSIS — Z0000 Encounter for general adult medical examination without abnormal findings: Secondary | ICD-10-CM

## 2020-01-03 NOTE — Patient Instructions (Signed)
Sydney Evans , Thank you for taking time to come for your Medicare Wellness Visit. I appreciate your ongoing commitment to your health goals. Please review the following plan we discussed and let me know if I can assist you in the future.   Screening recommendations/referrals: Colonoscopy: completed 04/09/2018 Mammogram: decline Bone Density: decline Recommended yearly ophthalmology/optometry visit for glaucoma screening and checkup Recommended yearly dental visit for hygiene and checkup  Vaccinations: Influenza vaccine: decline Pneumococcal vaccine: decline Tdap vaccine: due Shingles vaccine: discussed   Covid-19: decline  Advanced directives: Please bring a copy of your POA (Power of Attorney) and/or Living Will to your next appointment.   Conditions/risks identified: smoking  Next appointment: Follow up in one year for your annual wellness visit    Preventive Care 67 Years and Older, Female Preventive care refers to lifestyle choices and visits with your health care provider that can promote health and wellness. What does preventive care include?  A yearly physical exam. This is also called an annual well check.  Dental exams once or twice a year.  Routine eye exams. Ask your health care provider how often you should have your eyes checked.  Personal lifestyle choices, including:  Daily care of your teeth and gums.  Regular physical activity.  Eating a healthy diet.  Avoiding tobacco and drug use.  Limiting alcohol use.  Practicing safe sex.  Taking low-dose aspirin every day.  Taking vitamin and mineral supplements as recommended by your health care provider. What happens during an annual well check? The services and screenings done by your health care provider during your annual well check will depend on your age, overall health, lifestyle risk factors, and family history of disease. Counseling  Your health care provider may ask you questions about  your:  Alcohol use.  Tobacco use.  Drug use.  Emotional well-being.  Home and relationship well-being.  Sexual activity.  Eating habits.  History of falls.  Memory and ability to understand (cognition).  Work and work Statistician.  Reproductive health. Screening  You may have the following tests or measurements:  Height, weight, and BMI.  Blood pressure.  Lipid and cholesterol levels. These may be checked every 5 years, or more frequently if you are over 17 years old.  Skin check.  Lung cancer screening. You may have this screening every year starting at age 42 if you have a 30-pack-year history of smoking and currently smoke or have quit within the past 15 years.  Fecal occult blood test (FOBT) of the stool. You may have this test every year starting at age 72.  Flexible sigmoidoscopy or colonoscopy. You may have a sigmoidoscopy every 5 years or a colonoscopy every 10 years starting at age 64.  Hepatitis C blood test.  Hepatitis B blood test.  Sexually transmitted disease (STD) testing.  Diabetes screening. This is done by checking your blood sugar (glucose) after you have not eaten for a while (fasting). You may have this done every 1-3 years.  Bone density scan. This is done to screen for osteoporosis. You may have this done starting at age 12.  Mammogram. This may be done every 1-2 years. Talk to your health care provider about how often you should have regular mammograms. Talk with your health care provider about your test results, treatment options, and if necessary, the need for more tests. Vaccines  Your health care provider may recommend certain vaccines, such as:  Influenza vaccine. This is recommended every year.  Tetanus, diphtheria, and acellular  pertussis (Tdap, Td) vaccine. You may need a Td booster every 10 years.  Zoster vaccine. You may need this after age 60.  Pneumococcal 13-valent conjugate (PCV13) vaccine. One dose is recommended  after age 73.  Pneumococcal polysaccharide (PPSV23) vaccine. One dose is recommended after age 65. Talk to your health care provider about which screenings and vaccines you need and how often you need them. This information is not intended to replace advice given to you by your health care provider. Make sure you discuss any questions you have with your health care provider. Document Released: 05/19/2015 Document Revised: 01/10/2016 Document Reviewed: 02/21/2015 Elsevier Interactive Patient Education  2017 [place] Prevention in the Home Falls can cause injuries. They can happen to people of all ages. There are many things you can do to make your home safe and to help prevent falls. What can I do on the outside of my home?  Regularly fix the edges of walkways and driveways and fix any cracks.  Remove anything that might make you trip as you walk through a door, such as a raised step or threshold.  Trim any bushes or trees on the path to your home.  Use bright outdoor lighting.  Clear any walking paths of anything that might make someone trip, such as rocks or tools.  Regularly check to see if handrails are loose or broken. Make sure that both sides of any steps have handrails.  Any raised decks and porches should have guardrails on the edges.  Have any leaves, snow, or ice cleared regularly.  Use sand or salt on walking paths during winter.  Clean up any spills in your garage right away. This includes oil or grease spills. What can I do in the bathroom?  Use night lights.  Install grab bars by the toilet and in the tub and shower. Do not use towel bars as grab bars.  Use non-skid mats or decals in the tub or shower.  If you need to sit down in the shower, use a plastic, non-slip stool.  Keep the floor dry. Clean up any water that spills on the floor as soon as it happens.  Remove soap buildup in the tub or shower regularly.  Attach bath mats securely with  double-sided non-slip rug tape.  Do not have throw rugs and other things on the floor that can make you trip. What can I do in the bedroom?  Use night lights.  Make sure that you have a light by your bed that is easy to reach.  Do not use any sheets or blankets that are too big for your bed. They should not hang down onto the floor.  Have a firm chair that has side arms. You can use this for support while you get dressed.  Do not have throw rugs and other things on the floor that can make you trip. What can I do in the kitchen?  Clean up any spills right away.  Avoid walking on wet floors.  Keep items that you use a lot in easy-to-reach places.  If you need to reach something above you, use a strong step stool that has a grab bar.  Keep electrical cords out of the way.  Do not use floor polish or wax that makes floors slippery. If you must use wax, use non-skid floor wax.  Do not have throw rugs and other things on the floor that can make you trip. What can I do with my stairs?  Do not leave any items on the stairs.  Make sure that there are handrails on both sides of the stairs and use them. Fix handrails that are broken or loose. Make sure that handrails are as long as the stairways.  Check any carpeting to make sure that it is firmly attached to the stairs. Fix any carpet that is loose or worn.  Avoid having throw rugs at the top or bottom of the stairs. If you do have throw rugs, attach them to the floor with carpet tape.  Make sure that you have a light switch at the top of the stairs and the bottom of the stairs. If you do not have them, ask someone to add them for you. What else can I do to help prevent falls?  Wear shoes that:  Do not have high heels.  Have rubber bottoms.  Are comfortable and fit you well.  Are closed at the toe. Do not wear sandals.  If you use a stepladder:  Make sure that it is fully opened. Do not climb a closed stepladder.  Make  sure that both sides of the stepladder are locked into place.  Ask someone to hold it for you, if possible.  Clearly mark and make sure that you can see:  Any grab bars or handrails.  First and last steps.  Where the edge of each step is.  Use tools that help you move around (mobility aids) if they are needed. These include:  Canes.  Walkers.  Scooters.  Crutches.  Turn on the lights when you go into a dark area. Replace any light bulbs as soon as they burn out.  Set up your furniture so you have a clear path. Avoid moving your furniture around.  If any of your floors are uneven, fix them.  If there are any pets around you, be aware of where they are.  Review your medicines with your doctor. Some medicines can make you feel dizzy. This can increase your chance of falling. Ask your doctor what other things that you can do to help prevent falls. This information is not intended to replace advice given to you by your health care provider. Make sure you discuss any questions you have with your health care provider. Document Released: 02/16/2009 Document Revised: 09/28/2015 Document Reviewed: 05/27/2014 Elsevier Interactive Patient Education  2017 [employer].

## 2020-01-03 NOTE — Progress Notes (Signed)
I connected with Sydney Evans today by telephone and verified that I am speaking with the correct person using two identifiers. Location patient: home Location provider: work Persons participating in the virtual visit: Sydney Evans, [name] LPN.   I discussed the limitations, risks, security and privacy concerns of performing an evaluation and management service by telephone and the availability of in person appointments. I also discussed with the patient that there may be a patient responsible charge related to this service. The patient expressed understanding and verbally consented to this telephonic visit.    Interactive audio and video telecommunications were attempted between this provider and patient, however failed, due to patient having technical difficulties OR patient did not have access to video capability.  We continued and completed visit with audio only.     Vital signs may be patient reported or missing.  Subjective:   Sydney Evans is a 69 y.o. female who presents for Medicare Annual (Subsequent) preventive examination.  Review of Systems     Cardiac Risk Factors include: advanced age (>29men, >66 women);dyslipidemia;hypertension;sedentary lifestyle;smoking/ tobacco exposure     Objective:    Today's Vitals   01/03/20 1353 01/03/20 1354  Weight: 145 lb (65.8 kg)   Height: 5\' 2"  (1.575 m)   PainSc:  6    Body mass index is 26.52 kg/m.  Advanced Directives 01/03/2020 12/14/2019 11/18/2019 10/21/2019 10/03/2019 09/30/2019 09/21/2019  Does Patient Have a Medical Advance Directive? Yes Yes Yes Yes Yes Yes Yes  Type of Advance Directive Living will;Out of facility DNR (pink MOST or yellow form) Living will;Healthcare Power of [place];Out of facility DNR (pink MOST or yellow form) [place];Out of facility DNR (pink MOST or yellow form) [place];Out of facility DNR (pink MOST or yellow form) [place];Living will;Out of facility DNR (pink MOST or yellow form) [place];Living will Living will  Does patient want to make changes to medical advance directive? - - No - Patient declined - No - Patient declined No - Patient declined -  Copy of [place] in Chart? - No - copy requested No - copy requested No - copy requested No - copy requested No - copy requested -  Would patient like information on creating a medical advance directive? - - No - Patient declined - - - -  Pre-existing out of facility DNR order (yellow form or pink MOST form) - - - - - - -    Current Medications (verified) Outpatient Encounter Medications as of 01/03/2020  Medication Sig  . Carboxymethylcellulose Sodium (LUBRICANT EYE DROPS OP) Place 1 drop into both eyes daily as needed (Dry eye). Systane  . citalopram (CELEXA) 40 MG tablet Take 40 mg by mouth daily.  . diphenhydrAMINE (BENADRYL) 25 MG tablet Take 25 mg by mouth every 6 (six) hours as needed for itching.  . fluticasone (FLONASE) 50 MCG/ACT nasal spray Place 2 sprays into both nostrils daily as needed for allergies or rhinitis.  . IBU 800 MG tablet Take 800 mg by mouth daily as needed for mild pain or moderate pain.   [name] lamoTRIgine (LAMICTAL) 200 MG tablet Take 200 mg by mouth daily.  [name] omeprazole (PRILOSEC) 40 MG capsule Take 40 mg by mouth daily.  . promethazine (PHENERGAN) 12.5 MG tablet Take 1 tablet (12.5 mg total) by mouth every 8 (eight) hours as needed for nausea or vomiting.  . Travoprost, BAK Free, (TRAVATAN) 0.004 % SOLN ophthalmic solution Place  1 drop into both eyes at bedtime.  . ondansetron (ZOFRAN ODT) 4 MG disintegrating tablet Take 1 tablet (4 mg total) by mouth every 8 (eight) hours as needed for nausea or vomiting. (Patient not taking: Reported on 12/15/2019)  . ondansetron (ZOFRAN-ODT) 4 MG disintegrating tablet Take 1 tablet (4 mg total) by mouth every 8 (eight) hours as needed for nausea or vomiting.  3 times daily as needed   No facility-administered encounter medications on file as of 01/03/2020.    Allergies (verified) Hydroxyzine, Gabapentin, Dilaudid [hydromorphone], Hydrocodone, Ketorolac, Percocet [oxycodone-acetaminophen], and Toradol [ketorolac tromethamine]   History: Past Medical History:  Diagnosis Date  . Anemia   . Anxiety   . Arthritis   . Barrett's esophagus   . Bipolar disorder ([place])   . 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 Blood clot in vein   . Cancer ([place])    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  . Carotid arterial disease ([place])   . Contracture of joint of upper arm   . Depression   . Elevated lipids   . GERD (gastroesophageal reflux disease)   . Hypertension   . Migraine   . Osteoporosis   . Poor balance   . Sinus congestion   . Stroke ([place])    x 2  . TIA (transient ischemic attack)   . Vertigo    Past Surgical History:  Procedure Laterality Date  . ABDOMINAL HYSTERECTOMY    . BACK SURGERY    . BREAST BIOPSY Left   . CARPAL TUNNEL RELEASE Bilateral   . CATARACT EXTRACTION W/ INTRAOCULAR LENS  IMPLANT, BILATERAL Bilateral   . CHOLECYSTECTOMY    . COLONOSCOPY WITH PROPOFOL N/A 04/09/2018   Procedure: COLONOSCOPY WITH PROPOFOL;  Surgeon: Jonathon Bellows, MD;  Location: [facility] ENDOSCOPY;  Service: Gastroenterology;  Laterality: N/A;  . crystal cyst removed on left foot    . ESOPHAGOGASTRODUODENOSCOPY (EGD) WITH PROPOFOL N/A 04/09/2018   Procedure: ESOPHAGOGASTRODUODENOSCOPY (EGD) WITH PROPOFOL;  Surgeon: Jonathon Bellows, MD;  Location: [facility] ENDOSCOPY;  Service: Gastroenterology;  Laterality: N/A;  . ESOPHAGOGASTRODUODENOSCOPY (EGD) WITH PROPOFOL N/A 12/14/2019   Procedure: ESOPHAGOGASTRODUODENOSCOPY (EGD) WITH PROPOFOL;  Surgeon: Lesly Rubenstein, MD;  Location: [facility] ENDOSCOPY;  Service: Endoscopy;  Laterality: N/A;  . EYE SURGERY    . femoral fx    . FOOT SURGERY    . GIVENS CAPSULE STUDY N/A 06/16/2018   Procedure: GIVENS CAPSULE STUDY;  Surgeon: Jonathon Bellows, MD;  Location:  [facility] ENDOSCOPY;  Service: Gastroenterology;  Laterality: N/A;  . JOINT REPLACEMENT    . KNEE SURGERY    . ORIF PERIPROSTHETIC FRACTURE Right 10/07/2019   Procedure: OPEN REDUCTION INTERNAL FIXATION (ORIF) PERIPROSTHETIC FRACTURE;  Surgeon: Corky Mull, MD;  Location: [facility] ORS;  Service: Orthopedics;  Laterality: Right;  . TONSILLECTOMY    . TOTAL KNEE ARTHROPLASTY Bilateral   . TOTAL SHOULDER ARTHROPLASTY Left 08/01/2016   Procedure: TOTAL SHOULDER ARTHROPLASTY;  Surgeon: Corky Mull, MD;  Location: [facility] ORS;  Service: Orthopedics;  Laterality: Left;  . TOTAL SHOULDER ARTHROPLASTY Right 09/30/2019   Procedure: TOTAL SHOULDER ARTHROPLASTY;  Surgeon: Corky Mull, MD;  Location: [facility] ORS;  Service: Orthopedics;  Laterality: Right;   Family History  Problem Relation Age of Onset  . Other Mother        ?lupus   . Cancer Mother   . High Cholesterol Mother   . CAD Father        CABG  . High Cholesterol Father   . Arthritis Father   .  Cancer Sister   . Heart murmur Sister   . Heart murmur Brother   . High blood pressure Other        "for everybody"  . High Cholesterol Other        "for everybody"   Social History   Socioeconomic History  . Marital status: Single    Spouse name: Not on file  . Number of children: 3  . Years of education: Not on file  . Highest education level: Some college, no degree  Occupational History  . Not on file  Tobacco Use  . Smoking status: Current Every Day Smoker    Packs/day: 0.50    Types: Cigarettes    Last attempt to quit: 08/04/2016    Years since quitting: 3.4  . Smokeless tobacco: Never Used  Vaping Use  . Vaping Use: Never used  Substance and Sexual Activity  . Alcohol use: No  . Drug use: No  . Sexual activity: Not on file  Other Topics Concern  . Not on file  Social History Narrative   Lives at home alone in an apt   Right handed   Disabled since 1998   Caffeine: about 30 oz daily   Social Determinants of Health   Financial  Resource Strain: Low Risk   . Difficulty of Paying Living Expenses: Not hard at all  Food Insecurity: No Food Insecurity  . Worried About Charity fundraiser in the Last Year: Never true  . Ran Out of Food in the Last Year: Never true  Transportation Needs: No Transportation Needs  . Lack of Transportation (Medical): No  . Lack of Transportation (Non-Medical): No  Physical Activity: Inactive  . Days of Exercise per Week: 0 days  . Minutes of Exercise per Session: 0 min  Stress: Stress Concern Present  . Feeling of Stress : Very much  Social Connections:   . Frequency of Communication with Friends and Family: Not on file  . Frequency of Social Gatherings with Friends and Family: Not on file  . Attends Religious Services: Not on file  . Active Member of Clubs or Organizations: Not on file  . Attends Archivist Meetings: Not on file  . Marital Status: Not on file    Tobacco Counseling Ready to quit: No Counseling given: Not Answered   Clinical Intake:  Pre-visit preparation completed: Yes  Pain : 0-10 Pain Score: 6  Pain Type: Chronic pain Pain Location: Abdomen Pain Descriptors / Indicators: Aching Pain Onset: More than a month ago Pain Frequency: Intermittent Pain Relieving Factors: goes away itself  Pain Relieving Factors: goes away itself  Nutritional Status: BMI 25 -29 Overweight Nutritional Risks: Nausea/ vomitting/ diarrhea (diarrhea since taking stool softener) Diabetes: No  How often do you need to have someone help you when you read instructions, pamphlets, or other written materials from your doctor or pharmacy?: 1 - Never What is the last grade level you completed in school?: some college  Diabetic? no  Interpreter Needed?: No  Information entered by :: NAllen LPN   Activities of Daily Living In your present state of health, do you have any difficulty performing the following activities: 01/03/2020 11/11/2019  Hearing? Y Y  Comment deaf in  left ear -  Vision? N Y  Comment - -  Difficulty concentrating or making decisions? Tempie Donning  Walking or climbing stairs? Y Y  Comment - -  Dressing or bathing? N Y  Doing errands, shopping? Y N  Comment - -  Preparing Food and eating ? Y -  Using the Toilet? N -  In the past six months, have you accidently leaked urine? N -  Do you have problems with loss of bowel control? N -  Managing your Medications? N -  Managing your Finances? Y -  Housekeeping or managing your Housekeeping? Y -  Some recent data might be hidden    Patient Care Team: Volney American, PA-C as PCP - General (Family Medicine) Greg Cutter, LCSW as [place] (Licensed Clinical Social Worker)  Indicate any recent [place] you may have received from other than [facility] providers in the past year (date may be approximate).     Assessment:   This is a routine wellness examination for [facility].  Hearing/Vision screen  Hearing Screening   125Hz  250Hz  500Hz  1000Hz  2000Hz  3000Hz  4000Hz  6000Hz  8000Hz   Right ear:           Left ear:           Vision Screening Comments: Regular eye exams, [facility]  Dietary issues and exercise activities discussed:    Goals    .  "I get depressed often now because I can't do for myself." (pt-stated)      Current Barriers:  . Chronic Mental Health needs related to bipolar, depression and anxiety . Limited social support . Transportation . ADL IADL limitations . Mental Health Concerns  . Social Isolation . Memory Deficits . Inability to perform ADL's independently . Inability to perform IADL's independently . Suicidal Ideation/Homicidal Ideation: No  Clinical Social Work Goal(s):  [name] Over the next 120 days, patient/caregiver will work with SW to address concerns related to gaining additional support/resource connection in order to maintain health and mental health appropriately  . Over the next 120 days, patient will  demonstrate improved adherence to self care as evidenced by implementing healthy self-care into her daily routine such as: attending all medical appointments, contacting mental health resources that were emailed, deep breathing exercises, taking time for self-reflection, taking medications as prescribed, drinking water and daily exercise to improve mobility.  . Over the next 120 days, patient will work with SW  and therapist bi-monthly by telephone or in person to reduce or manage symptoms related to anxiety and depression.  . Over the next 120 days, patient will demonstrate improved health management independence as evidenced by implementing healthy self-care and positive support/resources into her daily routine to cope with stressors/mental health symptoms and improve overall health and well-being   Interventions: . Patient interviewed and appropriate assessments performed: brief mental health assessment . LCSW spoke with both patient and caregiver (niece) during last outreach. Patient recently changed primary care providers and is in need of mental health support implementation. Patient lives alone but has niece come check on her multiple times per day. Niece or grandchildren will sometimes stay the night with patient if needed. Patient's daughter have not been involved with her care which has upset patient.  . Patient complaints of ongoing muscle weakness. Patient has had recent falls. Patient is currently receiving PT in the home. Patient lost her service dog in January of this year and is unable to take appropriate care of their new service dog that is in training so patient's niece has agreed to take in this new service dog in and allow it to visit patient during the day to help lift patient's mood. . Patient interviewed and appropriate assessments performed . Provided mental health counseling with regard  to *depression and anxiety management.  Email sent to niece with available mental health  resource list. Education provided.  . Provided patient with information about available socialization opportunities within patient's nearby area. Email sent to niece with information on these [facility]. Patient reports receiving socialization through her neighbor that comes over daily and by going on outings with her niece.  . Discussed plans with patient for ongoing care management follow up and provided patient with direct contact information for care management team . Advised patient to check email and to contact CCM LCSW for any future social work related needs.  . Assisted patient/caregiver with obtaining information about health plan benefits . Provided education and assistance to client regarding Advanced Directives. . Provided education to patient/caregiver about Hospice and/or Palliative Care services . Referred patient to [facility] (mental health provider) for long term follow up and therapy/counseling . Brief CBTprovided during session today.  [name] UPDATE- [name] reports that patient is currently receiving PT and she is currently running errands for patient. She reports that she went by [facility] to ask for PCP to make a referral to a different GI provider because they are unable to wait until the end of September. Niece wondered if LCSW could inquire about this with PCP. LCSW will sent in basket message. [name] reports that she is looking into hiring in home care and has been in communication with [place]. She reports that they dropped off paperwork for PCP to sign. LCSW will also inquire with PCP about this as there are no notes in chart regarding this request. . [name] is requesting additional assistance and support due to patent's worsening health conditions. Patient had a recent ED visit and has been experiencing dry heaving after taking medication. She has been experiencing weakness, fatigue and diarrhea as well and reported today "I am tired of feeling so bad" to  niece. LCSW will update referral for CCM RNCM.  Patient Self Care Activities:  . Attends all scheduled provider appointments . Calls provider office for new concerns or questions . Ability for insight . Motivation for treatment . Strong family or social support  Patient Coping Strengths:  . Supportive Relationships . Hopefulness . Self Advocate . Able to Communicate Effectively  Patient Self Care Deficits:  . Lacks social connections . Unable to perform ADLs independently . Unable to perform IADLs independently  Please see past updates related to this goal by clicking on the "Past Updates" button in the selected goal      .  Patient Stated      01/03/2020, get back to being self      Depression Screen PHQ 2/9 Scores 01/03/2020 11/11/2019  PHQ - 2 Score 4 0  PHQ- 9 Score 14 2    Fall Risk Fall Risk  01/03/2020 11/11/2019 10/14/2017  Falls in the past year? 1 1 Yes  Comment legs give out, loses balance, vertigo - -  Number falls in past yr: 1 1 2  or more  Injury with Fall? 1 0 No  Risk Factor Category  - - High Fall Risk  Risk for fall due to : Impaired balance/gait;History of fall(s);Medication side effect - History of fall(s)  Follow up Falls evaluation completed;Education provided;Falls prevention discussed - Education provided;Falls prevention discussed    Any stairs in or around the home? No  If so, are there any without handrails? n/a Home free of loose throw rugs in walkways, pet beds, electrical cords, etc? Yes  Adequate lighting  in your home to reduce risk of falls? Yes   ASSISTIVE DEVICES UTILIZED TO PREVENT FALLS:  Life alert? No  Use of a cane, walker or w/c? Yes  Grab bars in the bathroom? Yes  Shower chair or bench in shower? Yes  Elevated toilet seat or a handicapped toilet? Yes   TIMED UP AND GO:  Was the test performed? No .     Cognitive Function:     6CIT Screen 01/03/2020  What Year? 0 points  What month? 0 points  What time? 0 points    Count back from 20 2 points  Months in reverse 4 points  Repeat phrase 6 points  Total Score 12    Immunizations  There is no immunization history on file for this patient.  TDAP status: Due, Education has been provided regarding the importance of this vaccine. Advised may receive this vaccine at local pharmacy or Health Dept. Aware to provide a copy of the vaccination record if obtained from local pharmacy or Health Dept. Verbalized acceptance and understanding. Flu Vaccine status: Declined, Education has been provided regarding the importance of this vaccine but patient still declined. Advised may receive this vaccine at local pharmacy or Health Dept. Aware to provide a copy of the vaccination record if obtained from local pharmacy or Health Dept. Verbalized acceptance and understanding. Pneumococcal vaccine status: Declined,  Education has been provided regarding the importance of this vaccine but patient still declined. Advised may receive this vaccine at local pharmacy or Health Dept. Aware to provide a copy of the vaccination record if obtained from local pharmacy or Health Dept. Verbalized acceptance and understanding.  Covid-19 vaccine status: Information provided on how to obtain vaccines.   Qualifies for Shingles Vaccine? Yes   Zostavax completed No   Shingrix Completed?: No.    Education has been provided regarding the importance of this vaccine. Patient has been advised to call insurance company to determine out of pocket expense if they have not yet received this vaccine. Advised may also receive vaccine at local pharmacy or Health Dept. Verbalized acceptance and understanding.  Screening Tests Health Maintenance  Topic Date Due  . Hepatitis C Screening  Never done  . COVID-19 Vaccine (1) Never done  . INFLUENZA VACCINE  08/03/2020 (Originally 12/05/2019)  . PNA vac Low Risk Adult (1 of 2 - PCV13) 11/21/2020 (Originally 09/12/2017)  . MAMMOGRAM  01/02/2021 (Originally 09/13/2002)   . DEXA SCAN  01/02/2021 (Originally 09/12/2017)  . TETANUS/TDAP  01/02/2021 (Originally 09/13/1971)  . COLONOSCOPY  04/10/2023    Health Maintenance  Health Maintenance Due  Topic Date Due  . Hepatitis C Screening  Never done  . COVID-19 Vaccine (1) Never done    Colorectal cancer screening: Completed 04/09/2018. Repeat every 5 years Mammogram status: Decline Bone Density status: Decline  Lung Cancer Screening: (Low Dose CT Chest recommended if Age 12-80 years, 30 pack-year currently smoking OR have quit w/in 15years.) does not qualify.   Lung Cancer Screening Referral: no  Additional Screening:  Hepatitis C Screening: does qualify;  Vision Screening: Recommended annual ophthalmology exams for early detection of glaucoma and other disorders of the eye. Is the patient up to date with their annual eye exam?  Yes  Who is the provider or what is the name of the office in which the patient attends annual eye exams? Dr. Chong Sicilian If pt is not established with a provider, would they like to be referred to a provider to establish care? No .  Dental Screening: Recommended annual dental exams for proper oral hygiene  Community Resource Referral / Chronic Care Management: CRR required this visit?  No   CCM required this visit?  No      Plan:     I have personally reviewed and noted the following in the patient's chart:   . Medical and social history . Use of alcohol, tobacco or illicit drugs  . Current medications and supplements . Functional ability and status . Nutritional status . Physical activity . Advanced directives . List of other physicians . Hospitalizations, surgeries, and ER visits in previous 12 months . Vitals . Screenings to include cognitive, depression, and falls . Referrals and appointments  In addition, I have reviewed and discussed with patient certain preventive protocols, quality metrics, and best practice recommendations. A written personalized care  plan for preventive services as well as general preventive health recommendations were provided to patient.     Kellie Simmering, LPN   [id number]   Nurse Notes:

## 2020-01-03 NOTE — Telephone Encounter (Signed)
Pt has been r/s for 02/15/2020

## 2020-01-18 ENCOUNTER — Other Ambulatory Visit: Payer: Self-pay | Admitting: Family Medicine

## 2020-01-18 DIAGNOSIS — Z1231 Encounter for screening mammogram for malignant neoplasm of breast: Secondary | ICD-10-CM

## 2020-01-19 ENCOUNTER — Other Ambulatory Visit: Payer: Self-pay

## 2020-01-19 ENCOUNTER — Ambulatory Visit: Payer: Medicare Other | Admitting: Gastroenterology

## 2020-01-19 MED ORDER — OMEPRAZOLE 40 MG PO CPDR: 40.0000 mg | DELAYED_RELEASE_CAPSULE | Freq: Every day | ORAL | 1 refills | Status: DC

## 2020-01-19 NOTE — Telephone Encounter (Signed)
[employer] faxed a Rx refill request on Omeprazole 40 mg cap

## 2020-01-20 ENCOUNTER — Other Ambulatory Visit: Payer: Self-pay | Admitting: Family Medicine

## 2020-01-20 NOTE — Telephone Encounter (Signed)
Requested medication (s) are due for refill today - yes  Requested medication (s) are on the active medication list - yes  Future visit scheduled -no  Last refill: 12/15/19  Notes to clinic: Request non delegated Rx  Requested Prescriptions  Pending Prescriptions Disp Refills   promethazine (PHENERGAN) 12.5 MG tablet [Pharmacy Med Name: PROMETHAZINE 12.5 MG TABLET] 20 tablet 0    Sig: Take 1 tablet (12.5 mg total) by mouth every 8 (eight) hours as needed for nausea or vomiting.      Not Delegated - Gastroenterology: Antiemetics Failed - 01/20/2020  2:45 PM      Failed - This refill cannot be delegated      Passed - Valid encounter within last 6 months    Recent Outpatient Visits           1 month ago Chronic nausea   [place], [place], [place]   1 month ago Nausea and vomiting, intractability of vomiting not specified, unspecified vomiting type   [facility], [place], [place]   2 months ago Essential hypertension   [place], [place], [place]       Future Appointments             In 11 months [place], [facility]                 Requested Prescriptions  Pending Prescriptions Disp Refills   promethazine (PHENERGAN) 12.5 MG tablet [Pharmacy Med Name: PROMETHAZINE 12.5 MG TABLET] 20 tablet 0    Sig: Take 1 tablet (12.5 mg total) by mouth every 8 (eight) hours as needed for nausea or vomiting.      Not Delegated - Gastroenterology: Antiemetics Failed - 01/20/2020  2:45 PM      Failed - This refill cannot be delegated      Passed - Valid encounter within last 6 months    Recent Outpatient Visits           1 month ago Chronic nausea   [place], [place], [place]   1 month ago Nausea and vomiting, intractability of vomiting not specified, unspecified vomiting type   [facility], [place], [place]   2 months ago Essential  hypertension   [facility] [place] American, [place]       Future Appointments             In 11 months [facility], [facility]

## 2020-01-20 NOTE — Telephone Encounter (Signed)
Routing to provider  

## 2020-01-24 ENCOUNTER — Ambulatory Visit: Payer: Medicare Other | Admitting: Gastroenterology

## 2020-02-07 ENCOUNTER — Other Ambulatory Visit: Payer: Self-pay

## 2020-02-07 MED ORDER — CITALOPRAM HYDROBROMIDE 40 MG PO TABS: 40.0000 mg | ORAL_TABLET | Freq: Every day | ORAL | 0 refills | Status: DC

## 2020-02-08 NOTE — Telephone Encounter (Signed)
LVM for pt to call back to schedule appt

## 2020-02-11 ENCOUNTER — Ambulatory Visit: Payer: Self-pay

## 2020-02-11 NOTE — Chronic Care Management (AMB) (Signed)
  Care Management   Follow Up Note   02/11/2020 Name: Sydney Evans MRN: [id number] DOB: 04-26-1951  Referred by: Volney American, PA-C Reason for referral : Care Coordination   [name] is a 69 y.o. year old female who is a primary care patient of Volney American, [place]. The care management team was consulted for assistance with care management and care coordination needs.    Review of patient status, including review of consultants reports, relevant laboratory and other test results, and collaboration with appropriate care team members and the patient's provider was performed as part of comprehensive patient evaluation and provision of chronic care management services.    LCSW completed second unsuccessful CCM outreach attempt today. A HIPPA compliant voice message was left encouraging patient to return call once available. LCSW will reschedule CCM SW appointment for patient as well.  A HIPAA compliant phone message was left for the patient providing contact information and requesting a return call.   Eula Fried, BSW, MSW, [facility] Care Management [email or web address] Phone: [phone]

## 2020-02-15 ENCOUNTER — Telehealth: Payer: Medicare HMO | Admitting: General Practice

## 2020-02-15 ENCOUNTER — Ambulatory Visit (INDEPENDENT_AMBULATORY_CARE_PROVIDER_SITE_OTHER): Payer: Medicare Other | Admitting: General Practice

## 2020-02-15 ENCOUNTER — Ambulatory Visit: Payer: Medicare Other | Admitting: Gastroenterology

## 2020-02-15 DIAGNOSIS — E440 Moderate protein-calorie malnutrition: Secondary | ICD-10-CM

## 2020-02-15 DIAGNOSIS — R110 Nausea: Secondary | ICD-10-CM

## 2020-02-15 DIAGNOSIS — E782 Mixed hyperlipidemia: Secondary | ICD-10-CM

## 2020-02-15 DIAGNOSIS — I10 Essential (primary) hypertension: Secondary | ICD-10-CM

## 2020-02-15 DIAGNOSIS — R112 Nausea with vomiting, unspecified: Secondary | ICD-10-CM

## 2020-02-15 DIAGNOSIS — F319 Bipolar disorder, unspecified: Secondary | ICD-10-CM

## 2020-02-15 NOTE — Patient Instructions (Signed)
Visit Information  Goals Addressed              This Visit's Progress     RNCM: Pt's niece: "They can not figure out why she is having nausea and stomach issues" (pt-stated)        CARE PLAN ENTRY (see longtitudinal plan of care for additional care plan information)  Current Barriers:   Chronic Disease Management support, education, and care coordination needs related to HTN, HLD, Bipolar Depression, and Chronic nausea, protein malnutrition    Clinical Goal(s) related to : HTN, HLD, Bipolar Depression, and Chronic nausea, protein malnutrition   Over the next 120 days, patient will:   Work with the care management team to address educational, disease management, and care coordination needs   Begin or continue self health monitoring activities as directed today Measure and record blood pressure 3/4 times per week and monitor for dehydration  and adhere to a heart healthy diet  Call provider office for new or worsened signs and symptoms Blood pressure findings outside established parameters and New or worsened symptom related to Chronic nausea, protein malnutrition and other chronic conditions  Call care management team with questions or concerns  Verbalize basic understanding of patient centered plan of care established today   Interventions related to : HTN, HLD, Bipolar Depression, and Chronic nausea, protein malnutrition    Evaluation of current treatment plans and patient's adherence to plan as established by provider.  Spoke with the patients niece. She is waiting for specialty appointments at [facility] for more testing to determine the cause of the chronic nausea and GI upset the patient is having. She is supposed to have a hydrogen break test and a EUS at [facility]. The patients niece is going to try and call today to follow up with referrals. The niece helps coordinate care with the patient and her appointments.   Assessed patient understanding of disease states.  The patients  niece has a good understanding of the patients chronic conditions. The patient has several chronic issues. She has good days and bad days. The patients niece states she sees the patient almost daily sometimes every day. She has the patients service dog because the dog is not fully trained. She also states that she has a lot going on but she is doing her best to keep everything going.    Assessed patient's education and care coordination needs.  Currently the patient has PT working with her, has MOW, has someone coming in and helping with her household jobs. The niece is thankful for the call and appreciates the CCM team.   Provided disease specific education to patient. Education on the roll of the CCM team and how team works with patient and caregivers to meet the goals of health and well being for the patient.   Collaborated with appropriate clinical care team members regarding patient needs.  The patients niece states she is working with the LCSW.  Review of pharmacy needs and the niece feels she is doing well with managing this at this time. The niece fills the pill box for the patient weekly. Denies any needs for pharmacy at this time.   Evaluation of upcoming provider appointments. Sees the pcp on 03-17-2020.   Patient Self Care Activities related to : HTN, HLD, Bipolar Depression, and Chronic nausea, protein malnutrition    Patient is unable to independently self-manage chronic health conditions  Initial goal documentation        Patient verbalizes understanding of instructions provided today.  Telephone follow up appointment with care management team member scheduled for: 04-19-2020 at 2:45 pm  [place], MSN, [facility] Mobile: [phone]

## 2020-02-15 NOTE — Telephone Encounter (Signed)
Pt scheduled for 03/17/20.Per pt's niece (dpr) only day available.

## 2020-02-15 NOTE — Chronic Care Management (AMB) (Signed)
Chronic Care Management   Follow Up Note   02/15/2020 Name: Neziah Vogelgesang MRN: [id number] DOB: 28-Aug-1950  Referred by: Volney American, PA-C Reason for referral : Chronic Care Management (RNCM Initial outreach for Chronic Disease Management and Care Coordination Needs)   Jaelah Hauth is a 69 y.o. year old female who is a primary care patient of Volney American, PA-C. The CCM team was consulted for assistance with chronic disease management and care coordination needs.    Review of patient status, including review of consultants reports, relevant laboratory and other test results, and collaboration with appropriate care team members and the patient's provider was performed as part of comprehensive patient evaluation and provision of chronic care management services.    SDOH (Social Determinants of Health) assessments performed: Yes See Care Plan activities for detailed interventions related to SDOH)  SDOH Interventions     Most Recent Value  SDOH Interventions  Stress Interventions Other (Comment)  [working with LCSW]       Outpatient Encounter Medications as of 02/15/2020  Medication Sig Note  . Carboxymethylcellulose Sodium (LUBRICANT EYE DROPS OP) Place 1 drop into both eyes daily as needed (Dry eye). Systane   . citalopram (CELEXA) 40 MG tablet Take 1 tablet (40 mg total) by mouth daily.   . diphenhydrAMINE (BENADRYL) 25 MG tablet Take 25 mg by mouth every 6 (six) hours as needed for itching.   . fluticasone (FLONASE) 50 MCG/ACT nasal spray Place 2 sprays into both nostrils daily as needed for allergies or rhinitis.   . IBU 800 MG tablet Take 800 mg by mouth daily as needed for mild pain or moderate pain.    [name] lamoTRIgine (LAMICTAL) 200 MG tablet Take 200 mg by mouth daily.   [name] omeprazole (PRILOSEC) 40 MG capsule Take 1 capsule (40 mg total) by mouth daily.   . ondansetron (ZOFRAN ODT) 4 MG disintegrating tablet Take 1 tablet (4 mg total) by mouth every 8  (eight) hours as needed for nausea or vomiting. (Patient not taking: Reported on 12/15/2019)   . ondansetron (ZOFRAN-ODT) 4 MG disintegrating tablet Take 1 tablet (4 mg total) by mouth every 8 (eight) hours as needed for nausea or vomiting. 3 times daily as needed 12/15/2019: Patient taking differently. Taking 2 tablets by mouth 3 times daily (24 mg total daily)  . promethazine (PHENERGAN) 12.5 MG tablet Take 1 tablet (12.5 mg total) by mouth every 8 (eight) hours as needed for nausea or vomiting.   . Travoprost, BAK Free, (TRAVATAN) 0.004 % SOLN ophthalmic solution Place 1 drop into both eyes at bedtime.    No facility-administered encounter medications on file as of 02/15/2020.     Objective:   BP Readings from Last 3 Encounters:  12/15/19 (!) 161/75  12/14/19 (!) 191/68  11/22/19 (!) 146/73   Goals Addressed              This Visit's Progress   .  RNCM: Pt's niece: "They can not figure out why she is having nausea and stomach issues" (pt-stated)        CARE PLAN ENTRY (see longtitudinal plan of care for additional care plan information)  Current Barriers:  . Chronic Disease Management support, education, and care coordination needs related to HTN, HLD, Bipolar Depression, and Chronic nausea, protein malnutrition   . Clinical Goal(s) related to : HTN, HLD, Bipolar Depression, and Chronic nausea, protein malnutrition   Over the next 120 days, patient will:  . Work with the care management  team to address educational, disease management, and care coordination needs  . Begin or continue self health monitoring activities as directed today Measure and record blood pressure 3/4 times per week and monitor for dehydration  and adhere to a heart healthy diet . Call provider office for new or worsened signs and symptoms Blood pressure findings outside established parameters and New or worsened symptom related to Chronic nausea, protein malnutrition and other chronic conditions . Call care  management team with questions or concerns . Verbalize basic understanding of patient centered plan of care established today  . Interventions related to : HTN, HLD, Bipolar Depression, and Chronic nausea, protein malnutrition   . Evaluation of current treatment plans and patient's adherence to plan as established by provider.  Spoke with the patients niece. She is waiting for specialty appointments at [facility] for more testing to determine the cause of the chronic nausea and GI upset the patient is having. She is supposed to have a hydrogen break test and a EUS at [facility]. The patients niece is going to try and call today to follow up with referrals. The niece helps coordinate care with the patient and her appointments.  . Assessed patient understanding of disease states.  The patients niece has a good understanding of the patients chronic conditions. The patient has several chronic issues. She has good days and bad days. The patients niece states she sees the patient almost daily sometimes every day. She has the patients service dog because the dog is not fully trained. She also states that she has a lot going on but she is doing her best to keep everything going.   . Assessed patient's education and care coordination needs.  Currently the patient has PT working with her, has MOW, has someone coming in and helping with her household jobs. The niece is thankful for the call and appreciates the CCM team.  . Provided disease specific education to patient. Education on the roll of the CCM team and how team works with patient and caregivers to meet the goals of health and well being for the patient.  [name] with appropriate clinical care team members regarding patient needs.  The patients niece states she is working with the LCSW.  Review of pharmacy needs and the niece feels she is doing well with managing this at this time. The niece fills the pill box for the patient weekly. Denies any needs for pharmacy  at this time.  . Evaluation of upcoming provider appointments. Sees the pcp on 03-17-2020.  [name] Patient Self Care Activities related to : HTN, HLD, Bipolar Depression, and Chronic nausea, protein malnutrition   . Patient is unable to independently self-manage chronic health conditions  Initial goal documentation         Plan:   Telephone follow up appointment with care management team member scheduled for: 04-19-2020 at 2:45 pm   [place], MSN, [facility] Mobile: [phone]

## 2020-03-14 ENCOUNTER — Other Ambulatory Visit: Payer: Self-pay | Admitting: Nurse Practitioner

## 2020-03-17 ENCOUNTER — Encounter: Payer: Self-pay | Admitting: Family Medicine

## 2020-03-17 ENCOUNTER — Other Ambulatory Visit: Payer: Self-pay

## 2020-03-17 ENCOUNTER — Ambulatory Visit (INDEPENDENT_AMBULATORY_CARE_PROVIDER_SITE_OTHER): Payer: Medicare Other | Admitting: Family Medicine

## 2020-03-17 VITALS — BP 131/71 | HR 52 | Temp 98.1°F | Ht 61.81 in | Wt 143.2 lb

## 2020-03-17 DIAGNOSIS — F319 Bipolar disorder, unspecified: Secondary | ICD-10-CM | POA: Diagnosis not present

## 2020-03-17 DIAGNOSIS — K219 Gastro-esophageal reflux disease without esophagitis: Secondary | ICD-10-CM | POA: Diagnosis not present

## 2020-03-17 DIAGNOSIS — I10 Essential (primary) hypertension: Secondary | ICD-10-CM

## 2020-03-17 DIAGNOSIS — R5382 Chronic fatigue, unspecified: Secondary | ICD-10-CM

## 2020-03-17 DIAGNOSIS — D509 Iron deficiency anemia, unspecified: Secondary | ICD-10-CM

## 2020-03-17 DIAGNOSIS — E782 Mixed hyperlipidemia: Secondary | ICD-10-CM | POA: Diagnosis not present

## 2020-03-17 DIAGNOSIS — K3189 Other diseases of stomach and duodenum: Secondary | ICD-10-CM

## 2020-03-17 DIAGNOSIS — R1084 Generalized abdominal pain: Secondary | ICD-10-CM

## 2020-03-17 DIAGNOSIS — K5909 Other constipation: Secondary | ICD-10-CM

## 2020-03-17 MED ORDER — DICLOFENAC SODIUM 1 % EX GEL: 4.0000 g | Freq: Four times a day (QID) | CUTANEOUS | 5 refills | Status: DC

## 2020-03-17 MED ORDER — POLYETHYLENE GLYCOL 3350 17 GM/SCOOP PO POWD: 17.0000 g | Freq: Two times a day (BID) | ORAL | 1 refills | Status: DC | PRN

## 2020-03-17 MED ORDER — CITALOPRAM HYDROBROMIDE 40 MG PO TABS
40.0000 mg | ORAL_TABLET | Freq: Every day | ORAL | 1 refills | Status: DC
Start: 2020-03-17 — End: 2020-09-18

## 2020-03-17 MED ORDER — LAMOTRIGINE 200 MG PO TABS
200.0000 mg | ORAL_TABLET | Freq: Every day | ORAL | 1 refills | Status: DC
Start: 2020-03-17 — End: 2020-09-14

## 2020-03-17 MED ORDER — OMEPRAZOLE 40 MG PO CPDR
40.0000 mg | DELAYED_RELEASE_CAPSULE | Freq: Every day | ORAL | 1 refills | Status: DC
Start: 2020-03-17 — End: 2020-07-26

## 2020-03-17 NOTE — Assessment & Plan Note (Signed)
Rechecking labs today. Await results. Treat as needed.  °

## 2020-03-17 NOTE — Assessment & Plan Note (Signed)
Under good control off her medicine. Will keep her off her medicine. Call if BP creeping up. Continue to monitor.

## 2020-03-17 NOTE — Assessment & Plan Note (Signed)
Lipoma on EGD [place]. No further work up needed. Call with any concerns.

## 2020-03-17 NOTE — Progress Notes (Signed)
BP 131/71   Pulse (!) 52   Temp 98.1 F (36.7 C) (Oral)   Ht 5' 1.81" (1.57 m)   Wt 143 lb 3.2 oz (65 kg)   SpO2 100%   BMI 26.35 kg/m    Subjective:    Patient ID: Sydney Evans, female    DOB: 1950/07/13, 69 y.o.   MRN: [id number]  HPI: Sydney Evans is a 69 y.o. female  Chief Complaint  Patient presents with  . Burping  . Abdominal Pain    some times when she stands and sometime when she sits  . Gas    then followed diarrhea  . Sore Throat    caused by burping  . Fatigue    feels extremtly tired, can not do anything but just lay down   Has been following with GI for chronic nausea, GERD and abdominal pain. Has had EGD and CT scan and an EGD [place] through [place] within the last 5 months. 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 She was sent for EGD [place] for the nodule. She had this done 10 days ago. The nodule was thought to be consistent with a lipoma and no further work up on it was recommended. Her ongoing GI symptoms are not thought to be related to her nodule and she was encouraged to follow up back up with her regular GI about those symptoms. She is scheduled to see them again in January.  Conitnues with a lot of issues with her stomach. Notes that she will have a lot of diarrhea/gas several times a day. This has only has been going on this week. She notes that she has been having trouble telling the difference between the gas and the diarrhea, she notes that she will have it up to several times a day. Today she has has been more constipated. She tends to go back and forth between the constipation and the diarrhea. She has not mentioned it to her GI doctor as it just started happening. No blood. Lots of gas. Also very sore thoat  HYPERTENSION / HYPERLIPIDEMIA- has not been taking her HCTZ or her lisinopril since about May when she was at the hospital for her ileus and shoulder  replacement Satisfied with current treatment? yes Duration of hypertension: chronic BP monitoring frequency: a few times a month BP range: 120s/70s BP medication side effects: not on any medicine right now.  Past BP meds: lisinopril and HCTZ Duration of hyperlipidemia: chronic Cholesterol medication side effects: no Cholesterol supplements: not on anything Past cholesterol medications: none Medication compliance: not on anything Aspirin: no Recent stressors: yes Recurrent headaches: no Visual changes: no Palpitations: no Dyspnea: no Chest pain: no Lower extremity edema: no Dizzy/lightheaded: no  BIPOLAR Mood status: controlled Satisfied with current treatment?: yes Symptom severity: mild  Duration of current treatment : chronic Side effects: no Medication compliance: excellent compliance Psychotherapy/counseling: no  Previous psychiatric medications: lamictal and celexa Depressed mood: no Anxious mood: no Anhedonia: no Significant weight loss or gain: no Insomnia: no  Fatigue: yes Feelings of worthlessness or guilt: no Impaired concentration/indecisiveness: no Suicidal ideations: no Hopelessness: no Crying spells: no Depression screen Sydney Evans 2/9 03/17/2020 01/03/2020 11/11/2019  Decreased Interest 0 2 0  Down, Depressed, Hopeless 0 2 0  PHQ - 2 Score 0 4 0  Altered sleeping 3 3 0  Tired, decreased energy 3 3 1   Change in  appetite 3 2 1   Feeling bad or failure about yourself  0 0 0  Trouble concentrating 0 2 0  Moving slowly or fidgety/restless 0 0 0  Suicidal thoughts 0 0 0  PHQ-9 Score 9 14 2   Difficult doing work/chores - Somewhat difficult -     Relevant past medical, surgical, family and social history reviewed and updated as indicated. Interim medical history since our last visit reviewed. Allergies and medications reviewed and updated.  Review of Systems  Constitutional: Positive for fatigue. Negative for activity change, appetite change, chills,  diaphoresis, fever and unexpected weight change.  Respiratory: Negative.   Cardiovascular: Negative.   Gastrointestinal: Positive for abdominal pain, constipation, diarrhea and nausea. Negative for abdominal distention, anal bleeding, blood in stool, rectal pain and vomiting.  Genitourinary: Negative.   Musculoskeletal: Positive for arthralgias, back pain and myalgias. Negative for gait problem, joint swelling, neck pain and neck stiffness.  Skin: Negative.   Neurological: Negative.   Psychiatric/Behavioral: Negative.     Per HPI unless specifically indicated above     Objective:    BP 131/71   Pulse (!) 52   Temp 98.1 F (36.7 C) (Oral)   Ht 5' 1.81" (1.57 m)   Wt 143 lb 3.2 oz (65 kg)   SpO2 100%   BMI 26.35 kg/m   Wt Readings from Last 3 Encounters:  03/17/20 143 lb 3.2 oz (65 kg)  01/03/20 145 lb (65.8 kg)  12/15/19 144 lb (65.3 kg)    Physical Exam Vitals and nursing note reviewed.  Constitutional:      General: She is not in acute distress.    Appearance: Normal appearance. She is not ill-appearing, toxic-appearing or diaphoretic.  HENT:     Head: Normocephalic and atraumatic.     Right Ear: External ear normal.     Left Ear: External ear normal.     Nose: Nose normal.     Mouth/Throat:     Mouth: Mucous membranes are moist.     Pharynx: Oropharynx is clear.  Eyes:     General: No scleral icterus.       Right eye: No discharge.        Left eye: No discharge.     Extraocular Movements: Extraocular movements intact.     Conjunctiva/sclera: Conjunctivae normal.     Pupils: Pupils are equal, round, and reactive to light.  Cardiovascular:     Rate and Rhythm: Normal rate and regular rhythm.     Pulses: Normal pulses.     Heart sounds: Normal heart sounds. No murmur heard.  No friction rub. No gallop.   Pulmonary:     Effort: Pulmonary effort is normal. No respiratory distress.     Breath sounds: Normal breath sounds. No stridor. No wheezing, rhonchi or rales.   Chest:     Chest wall: No tenderness.  Musculoskeletal:        General: Normal range of motion.     Cervical back: Normal range of motion and neck supple.  Skin:    General: Skin is warm and dry.     Capillary Refill: Capillary refill takes less than 2 seconds.     Coloration: Skin is not jaundiced or pale.     Findings: No bruising, erythema, lesion or rash.  Neurological:     General: No focal deficit present.     Mental Status: She is alert and oriented to person, place, and time. Mental status is at baseline.  Psychiatric:  Mood and Affect: Mood normal.        Behavior: Behavior normal.        Thought Content: Thought content normal.        Judgment: Judgment normal.      ENDOSCOPIC FINDING: : The examined esophagus was endoscopically normal. The entire examined stomach was endoscopically normal. A single 10 mm submucosal nodule with a localized  distribution was found in the duodenal sweep. ENDOSONOGRAPHIC FINDING: : An oval intramural (subepithelial) lesion was found in  the duodenal sweep. The lesion was hyperechoic.  Endosonographically, the lesion appeared to originate  from within the submucosa (Layer 3). The lesion also  appeared to involve the following wall layer(s): deep  mucosa (Layer 2). The lesion measured 5.5 mm (in  maximum thickness). The lesion also measured 5.9 mm in  diameter. The outer margins were well defined. No lymph nodes were visualized in the celiac region  (level 20) or periduodenal region. There was no sign of significant endosonographic  abnormality in the left lobe of the liver. No masses  were identified.   Results for orders placed or performed in visit on 12/15/19  UA/M w/rflx Culture, Routine   Specimen: Urine   Urine  Result Value Ref Range   Specific Gravity, UA 1.010 1.005 - 1.030   pH, UA 7.0 5.0 - 7.5   Color, UA Yellow Yellow   Appearance Ur Clear Clear   Leukocytes,UA Negative Negative   Protein,UA Negative  Negative/Trace   Glucose, UA Negative Negative   Ketones, UA Negative Negative   RBC, UA Negative Negative   Bilirubin, UA Negative Negative   Urobilinogen, Ur 0.2 0.2 - 1.0 mg/dL   Nitrite, UA Negative Negative      Assessment & Plan:   Problem List Items Addressed This Visit      Cardiovascular and Mediastinum   Essential hypertension - Primary    Under good control off her medicine. Will keep her off her medicine. Call if BP creeping up. Continue to monitor.       Relevant Orders   Comprehensive metabolic panel     Digestive   GERD (gastroesophageal reflux disease)    Not under good control. Continue to follow with GI. OK to take her omeprazole BID occasionally- discussed the risks to absorption. Call with any concerns.       Relevant Medications   polyethylene glycol powder (GLYCOLAX/MIRALAX) 17 GM/SCOOP powder   omeprazole (PRILOSEC) 40 MG capsule   Other Relevant Orders   Comprehensive metabolic panel     Other   Hyperlipidemia    Rechecking labs today. Await results. Treat as needed.       Relevant Orders   Comprehensive metabolic panel   Lipid Panel w/o Chol/HDL Ratio   Iron deficiency anemia    Rechecking labs today. Await results. Treat as needed.       Relevant Orders   CBC with Differential/Platelet   Iron and TIBC   Ferritin   Generalized abdominal pain    Not under good control. Concern for constipation. Will treat with miralax, if returns, call. Continue to follow with GI.       Bipolar depression ([place])    Under good control on current regimen. Continue current regimen. Continue to monitor. Call with any concerns. Refills given.        Relevant Orders   Comprehensive metabolic panel   Lamotrigine level   Duodenal nodule    Lipoma on EGD [place]. No further work up needed. Call with  any concerns.        Other Visit Diagnoses    Chronic constipation       Will get her cleared out with miralax. If resolved, then wonderful, if returns, consider  daily medicine. Continue to follow with GI.   Relevant Medications   polyethylene glycol powder (GLYCOLAX/MIRALAX) 17 GM/SCOOP powder   Other Relevant Orders   Comprehensive metabolic panel   Chronic fatigue       Likely multi-factorial. Checking labs today. Await results.   Relevant Orders   CBC with Differential/Platelet   Comprehensive metabolic panel   TSH   VITAMIN D 25 Hydroxy (Vit-D Deficiency, Fractures)       Follow up plan: Return 3-6 months physical. As needed for stomach issues.

## 2020-03-17 NOTE — Assessment & Plan Note (Signed)
Not under good control. Continue to follow with GI. OK to take her omeprazole BID occasionally- discussed the risks to absorption. Call with any concerns.

## 2020-03-17 NOTE — Assessment & Plan Note (Signed)
Not under good control. Concern for constipation. Will treat with miralax, if returns, call. Continue to follow with GI.

## 2020-03-17 NOTE — Assessment & Plan Note (Signed)
Under good control on current regimen. Continue current regimen. Continue to monitor. Call with any concerns. Refills given.   

## 2020-03-21 LAB — COMPREHENSIVE METABOLIC PANEL
ALT: 9 IU/L (ref 0–32)
AST: 16 IU/L (ref 0–40)
Albumin/Globulin Ratio: 1.8 (ref 1.2–2.2)
Albumin: 4 g/dL (ref 3.8–4.8)
Alkaline Phosphatase: 96 IU/L (ref 44–121)
BUN/Creatinine Ratio: 11 — ABNORMAL LOW (ref 12–28)
BUN: 9 mg/dL (ref 8–27)
Bilirubin Total: 0.3 mg/dL (ref 0.0–1.2)
CO2: 21 mmol/L (ref 20–29)
Calcium: 9.4 mg/dL (ref 8.7–10.3)
Chloride: 104 mmol/L (ref 96–106)
Creatinine, Ser: 0.82 mg/dL (ref 0.57–1.00)
GFR calc Af Amer: 86 mL/min/{1.73_m2} (ref >59)
GFR calc non Af Amer: 74 mL/min/{1.73_m2} (ref >59)
Globulin, Total: 2.2 g/dL (ref 1.5–4.5)
Glucose: 81 mg/dL (ref 65–99)
Potassium: 3.4 mmol/L — ABNORMAL LOW (ref 3.5–5.2)
Sodium: 139 mmol/L (ref 134–144)
Total Protein: 6.2 g/dL (ref 6.0–8.5)

## 2020-03-21 LAB — LIPID PANEL W/O CHOL/HDL RATIO
Cholesterol, Total: 221 mg/dL — ABNORMAL HIGH (ref 100–199)
HDL: 59 mg/dL (ref >39)
LDL Chol Calc (NIH): 150 mg/dL — ABNORMAL HIGH (ref 0–99)
Triglycerides: 69 mg/dL (ref 0–149)
VLDL Cholesterol Cal: 12 mg/dL (ref 5–40)

## 2020-03-21 LAB — CBC WITH DIFFERENTIAL/PLATELET
Basophils Absolute: 0.1 10*3/uL (ref 0.0–0.2)
Basos: 2 %
EOS (ABSOLUTE): 0.1 10*3/uL (ref 0.0–0.4)
Eos: 1 %
Hematocrit: 30.8 % — ABNORMAL LOW (ref 34.0–46.6)
Hemoglobin: 9.1 g/dL — ABNORMAL LOW (ref 11.1–15.9)
Immature Grans (Abs): 0 10*3/uL (ref 0.0–0.1)
Immature Granulocytes: 0 %
Lymphocytes Absolute: 1.5 10*3/uL (ref 0.7–3.1)
Lymphs: 31 %
MCH: 21.9 pg — ABNORMAL LOW (ref 26.6–33.0)
MCHC: 29.5 g/dL — ABNORMAL LOW (ref 31.5–35.7)
MCV: 74 fL — ABNORMAL LOW (ref 79–97)
Monocytes Absolute: 0.4 10*3/uL (ref 0.1–0.9)
Monocytes: 7 %
Neutrophils Absolute: 2.9 10*3/uL (ref 1.4–7.0)
Neutrophils: 59 %
Platelets: 396 10*3/uL (ref 150–450)
RBC: 4.15 x10E6/uL (ref 3.77–5.28)
RDW: 17.9 % — ABNORMAL HIGH (ref 11.7–15.4)
WBC: 4.9 10*3/uL (ref 3.4–10.8)

## 2020-03-21 LAB — IRON AND TIBC
Iron Saturation: 5 % — CL (ref 15–55)
Iron: 19 ug/dL — ABNORMAL LOW (ref 27–139)
Total Iron Binding Capacity: 411 ug/dL (ref 250–450)
UIBC: 392 ug/dL — ABNORMAL HIGH (ref 118–369)

## 2020-03-21 LAB — TSH: TSH: 3.92 u[IU]/mL (ref 0.450–4.500)

## 2020-03-21 LAB — VITAMIN D 25 HYDROXY (VIT D DEFICIENCY, FRACTURES): Vit D, 25-Hydroxy: 32.7 ng/mL (ref 30.0–100.0)

## 2020-03-21 LAB — FERRITIN: Ferritin: 8 ng/mL — ABNORMAL LOW (ref 15–150)

## 2020-03-21 LAB — LAMOTRIGINE LEVEL: Lamotrigine Lvl: 8.5 ug/mL (ref 2.0–20.0)

## 2020-04-19 ENCOUNTER — Telehealth: Payer: Self-pay | Admitting: General Practice

## 2020-04-19 ENCOUNTER — Ambulatory Visit: Payer: Self-pay | Admitting: General Practice

## 2020-04-19 DIAGNOSIS — F419 Anxiety disorder, unspecified: Secondary | ICD-10-CM

## 2020-04-19 DIAGNOSIS — K219 Gastro-esophageal reflux disease without esophagitis: Secondary | ICD-10-CM

## 2020-04-19 DIAGNOSIS — R112 Nausea with vomiting, unspecified: Secondary | ICD-10-CM

## 2020-04-19 DIAGNOSIS — E782 Mixed hyperlipidemia: Secondary | ICD-10-CM

## 2020-04-19 DIAGNOSIS — I10 Essential (primary) hypertension: Secondary | ICD-10-CM

## 2020-04-19 DIAGNOSIS — R1084 Generalized abdominal pain: Secondary | ICD-10-CM

## 2020-04-19 DIAGNOSIS — R110 Nausea: Secondary | ICD-10-CM

## 2020-04-19 DIAGNOSIS — F319 Bipolar disorder, unspecified: Secondary | ICD-10-CM

## 2020-04-19 NOTE — Chronic Care Management (AMB) (Signed)
Chronic Care Management   Follow Up Note   04/19/2020 Name: Sydney Evans MRN: [id number] DOB: [birth year]  Referred by: Volney American, PA-C Reason for referral : Chronic Care Management (RNCM: Follow up for Chronic Disease Management and Care Coordination Needs)   Sydney Evans is a 69 y.o. year old female who is a primary care patient of Volney American, [place]. The CCM team was consulted for assistance with chronic disease management and care coordination needs.    Review of patient status, including review of consultants reports, relevant laboratory and other test results, and collaboration with appropriate care team members and the patient's provider was performed as part of comprehensive patient evaluation and provision of chronic care management services.    SDOH (Social Determinants of Health) assessments performed: Yes See Care Plan activities for detailed interventions related to [name])     Outpatient Encounter Medications as of 04/19/2020  Medication Sig Note  . Carboxymethylcellulose Sodium (LUBRICANT EYE DROPS OP) Place 1 drop into both eyes daily as needed (Dry eye). Systane   . citalopram (CELEXA) 40 MG tablet Take 1 tablet (40 mg total) by mouth daily.   . diclofenac Sodium (VOLTAREN) 1 % GEL Apply 4 g topically 4 (four) times daily.   . diphenhydrAMINE (BENADRYL) 25 MG tablet Take 25 mg by mouth every 6 (six) hours as needed for itching.   . fluticasone (FLONASE) 50 MCG/ACT nasal spray Place 2 sprays into both nostrils daily as needed for allergies or rhinitis.   [name] lamoTRIgine (LAMICTAL) 200 MG tablet Take 1 tablet (200 mg total) by mouth daily.   [name] omeprazole (PRILOSEC) 40 MG capsule Take 1-2 capsules (40-80 mg total) by mouth daily.   . ondansetron (ZOFRAN ODT) 4 MG disintegrating tablet Take 1 tablet (4 mg total) by mouth every 8 (eight) hours as needed for nausea or vomiting. (Patient not taking: Reported on 12/15/2019)   . ondansetron (ZOFRAN-ODT) 4  MG disintegrating tablet Take 1 tablet (4 mg total) by mouth every 8 (eight) hours as needed for nausea or vomiting. 3 times daily as needed (Patient not taking: Reported on 03/17/2020) 12/15/2019: Patient taking differently. Taking 2 tablets by mouth 3 times daily (24 mg total daily)  . polyethylene glycol powder (GLYCOLAX/MIRALAX) 17 GM/SCOOP powder Take 17 g by mouth 2 (two) times daily as needed.   . promethazine (PHENERGAN) 12.5 MG tablet Take 1 tablet (12.5 mg total) by mouth every 8 (eight) hours as needed for nausea or vomiting.   . Travoprost, BAK Free, (TRAVATAN) 0.004 % SOLN ophthalmic solution Place 1 drop into both eyes at bedtime.    No facility-administered encounter medications on file as of 04/19/2020.     Objective:  BP Readings from Last 3 Encounters:  03/17/20 131/71  12/15/19 (!) 161/75  12/14/19 (!) 191/68    Goals Addressed              This Visit's Progress   .  RNCM: Pt's niece: "They can not figure out why she is having nausea and stomach issues" (pt-stated)        CARE PLAN ENTRY (see longtitudinal plan of care for additional care plan information)  Current Barriers:  . Chronic Disease Management support, education, and care coordination needs related to HTN, HLD, Bipolar Depression, and Chronic nausea, protein malnutrition   . Clinical Goal(s) related to : HTN, HLD, Bipolar Depression, and Chronic nausea, protein malnutrition   Over the next 120 days, patient will:  . Work with the care management  team to address educational, disease management, and care coordination needs  . Begin or continue self health monitoring activities as directed today Measure and record blood pressure 3/4 times per week and monitor for dehydration  and adhere to a heart healthy diet . Call provider office for new or worsened signs and symptoms Blood pressure findings outside established parameters and New or worsened symptom related to Chronic nausea, protein malnutrition and  other chronic conditions . Call care management team with questions or concerns . Verbalize basic understanding of patient centered plan of care established today  . Interventions related to : HTN, HLD, Bipolar Depression, and Chronic nausea, protein malnutrition   . Evaluation of current treatment plans and patient's adherence to plan as established by provider.  Spoke with the patients niece. She is waiting for specialty appointments at [facility] for more testing to determine the cause of the chronic nausea and GI upset the patient is having. She is supposed to have a hydrogen break test and a EUS at [facility]. The patients niece is going to try and call today to follow up with referrals. The niece helps coordinate care with the patient and her appointments. 04-19-2020: The patient had a SIBO test and it was positive. She is taking Xifanxin 550 mg TID for 2 weeks.  She was told she has a bad bacteria growing and this will help get rid of it. The patients niece says they are optimistic that this is going to help her considerably.  . Assessed patient understanding of disease states.  The patients niece has a good understanding of the patients chronic conditions. The patient has several chronic issues. She has good days and bad days. The patients niece states she sees the patient almost daily sometimes every day. She has the patients service dog because the dog is not fully trained. She also states that she has a lot going on but she is doing her best to keep everything going.   . Assessed patient's education and care coordination needs.  Currently the patient has PT working with her, has MOW, has someone coming in and helping with her household jobs. The niece is thankful for the call and appreciates the CCM team.  . Provided disease specific education to patient. Education on the roll of the CCM team and how team works with patient and caregivers to meet the goals of health and well being for the patient.   [name] with appropriate clinical care team members regarding patient needs.  The patients niece states she is working with the LCSW.  Review of pharmacy needs and the niece feels she is doing well with managing this at this time. The niece fills the pill box for the patient weekly. Denies any needs for pharmacy at this time.  . Evaluation of upcoming provider appointments. No upcoming appointments.  The niece will see about getting a new appointment after Christmas to see the pcp. Has RNCM number to call for changes in condition/questions/concerns.  . Patient Self Care Activities related to : HTN, HLD, Bipolar Depression, and Chronic nausea, protein malnutrition   . Patient is unable to independently self-manage chronic health conditions  Please see past updates related to this goal by clicking on the "Past Updates" button in the selected goal           Plan:   Telephone follow up appointment with care management team member scheduled for:  06-07-2020 at 2:30 pm    Noreene Larsson RN, MSN, [place] Coordinator  [facility] Mobile: [phone]

## 2020-04-19 NOTE — Patient Instructions (Signed)
Visit Information  Goals Addressed              This Visit's Progress   .  RNCM: Pt's niece: "They can not figure out why she is having nausea and stomach issues" (pt-stated)        CARE PLAN ENTRY (see longtitudinal plan of care for additional care plan information)  Current Barriers:  . Chronic Disease Management support, education, and care coordination needs related to HTN, HLD, Bipolar Depression, and Chronic nausea, protein malnutrition   . Clinical Goal(s) related to : HTN, HLD, Bipolar Depression, and Chronic nausea, protein malnutrition   Over the next 120 days, patient will:  . Work with the care management team to address educational, disease management, and care coordination needs  . Begin or continue self health monitoring activities as directed today Measure and record blood pressure 3/4 times per week and monitor for dehydration  and adhere to a heart healthy diet . Call provider office for new or worsened signs and symptoms Blood pressure findings outside established parameters and New or worsened symptom related to Chronic nausea, protein malnutrition and other chronic conditions . Call care management team with questions or concerns . Verbalize basic understanding of patient centered plan of care established today  . Interventions related to : HTN, HLD, Bipolar Depression, and Chronic nausea, protein malnutrition   . Evaluation of current treatment plans and patient's adherence to plan as established by provider.  Spoke with the patients niece. She is waiting for specialty appointments at [facility] for more testing to determine the cause of the chronic nausea and GI upset the patient is having. She is supposed to have a hydrogen break test and a EUS at [facility]. The patients niece is going to try and call today to follow up with referrals. The niece helps coordinate care with the patient and her appointments. 04-19-2020: The patient had a SIBO test and it was positive. She is  taking Xifanxin 550 mg TID for 2 weeks.  She was told she has a bad bacteria growing and this will help get rid of it. The patients niece says they are optimistic that this is going to help her considerably.  . Assessed patient understanding of disease states.  The patients niece has a good understanding of the patients chronic conditions. The patient has several chronic issues. She has good days and bad days. The patients niece states she sees the patient almost daily sometimes every day. She has the patients service dog because the dog is not fully trained. She also states that she has a lot going on but she is doing her best to keep everything going.   . Assessed patient's education and care coordination needs.  Currently the patient has PT working with her, has MOW, has someone coming in and helping with her household jobs. The niece is thankful for the call and appreciates the CCM team.  . Provided disease specific education to patient. Education on the roll of the CCM team and how team works with patient and caregivers to meet the goals of health and well being for the patient.  [name] with appropriate clinical care team members regarding patient needs.  The patients niece states she is working with the LCSW.  Review of pharmacy needs and the niece feels she is doing well with managing this at this time. The niece fills the pill box for the patient weekly. Denies any needs for pharmacy at this time.  . Evaluation of upcoming provider  appointments. No upcoming appointments.  The niece will see about getting a new appointment after Christmas to see the pcp. Has RNCM number to call for changes in condition/questions/concerns.  . Patient Self Care Activities related to : HTN, HLD, Bipolar Depression, and Chronic nausea, protein malnutrition   . Patient is unable to independently self-manage chronic health conditions  Please see past updates related to this goal by clicking on the "Past Updates"  button in the selected goal         The patient verbalized understanding of instructions, educational materials, and care plan provided today and declined offer to receive copy of patient instructions, educational materials, and care plan.   Telephone follow up appointment with care management team member scheduled for: 06-27-2020 at 2:30 pm  [place], MSN, [facility] Mobile: [phone]

## 2020-04-26 ENCOUNTER — Telehealth: Payer: Self-pay

## 2020-05-08 ENCOUNTER — Other Ambulatory Visit: Payer: Self-pay

## 2020-05-08 NOTE — Telephone Encounter (Signed)
Patient last seen 03/17/20

## 2020-05-10 MED ORDER — FLUTICASONE PROPIONATE 50 MCG/ACT NA SUSP: 2.0000 | Freq: Every day | NASAL | 12 refills | Status: DC | PRN

## 2020-05-12 ENCOUNTER — Emergency Department
Admission: EM | Admit: 2020-05-12 | Discharge: 2020-05-12 | Disposition: A | Discharge status: Home / Self Care | Payer: Medicare Other | Attending: Emergency Medicine | Admitting: Emergency Medicine

## 2020-05-12 ENCOUNTER — Other Ambulatory Visit: Payer: Self-pay

## 2020-05-12 ENCOUNTER — Emergency Department: Payer: Medicare Other

## 2020-05-12 DIAGNOSIS — M25551 Pain in right hip: Secondary | ICD-10-CM | POA: Insufficient documentation

## 2020-05-12 DIAGNOSIS — M25511 Pain in right shoulder: Secondary | ICD-10-CM | POA: Insufficient documentation

## 2020-05-12 DIAGNOSIS — Z79899 Other long term (current) drug therapy: Secondary | ICD-10-CM | POA: Insufficient documentation

## 2020-05-12 DIAGNOSIS — Z8542 Personal history of malignant neoplasm of other parts of uterus: Secondary | ICD-10-CM | POA: Insufficient documentation

## 2020-05-12 DIAGNOSIS — Z8673 Personal history of transient ischemic attack (TIA), and cerebral infarction without residual deficits: Secondary | ICD-10-CM | POA: Insufficient documentation

## 2020-05-12 DIAGNOSIS — Z96611 Presence of right artificial shoulder joint: Secondary | ICD-10-CM | POA: Insufficient documentation

## 2020-05-12 DIAGNOSIS — Z96612 Presence of left artificial shoulder joint: Secondary | ICD-10-CM | POA: Diagnosis not present

## 2020-05-12 DIAGNOSIS — Z96653 Presence of artificial knee joint, bilateral: Secondary | ICD-10-CM | POA: Diagnosis not present

## 2020-05-12 DIAGNOSIS — W19XXXA Unspecified fall, initial encounter: Secondary | ICD-10-CM | POA: Diagnosis not present

## 2020-05-12 DIAGNOSIS — F17210 Nicotine dependence, cigarettes, uncomplicated: Secondary | ICD-10-CM | POA: Insufficient documentation

## 2020-05-12 DIAGNOSIS — I10 Essential (primary) hypertension: Secondary | ICD-10-CM | POA: Diagnosis not present

## 2020-05-12 NOTE — ED Provider Notes (Signed)
[facility] Emergency Department Provider Note  ____________________________________________   Event Date/Time   First MD Initiated Contact with Patient 05/12/20 1807     (approximate)  I have reviewed the triage vital signs and the nursing notes.   HISTORY  Chief Complaint Fall    HPI Sydney Evans is a 70 y.o. female with anxiety, stroke, hypertension who comes in for fall.  Patient states that she had a mechanical fall today.  She did not hit her head did not lose consciousness.  Has no neck pain.  States that she fell onto her right side.  States that her main pain is her right shoulder.  She initially had some tingling sensation down her right arm but it seems to have been relieved now.  She also reports of pain on the right upper leg/hip region.  She was able to get herself off the ground.  She was not on the ground for long time.  She reported after the fall that she got very anxious and started to have some anxiety given she just recently was doing better from a medical standpoint.  She states that she developed some chest discomfort.  When asked the patient if felt like an elephant sitting on her chest she stated no.  Explained the triage note stated this and she stated that she is not having any chest pain at all and she never felt like it was an elephant sitting on her chest.  She states that she just thinks it was from her anxiety and that it is all gone now.  She denies any pain prior to falling over.  She does want to make sure she did not have a fracture since she has had prior fractures in both of these extremities.  She did not hit her head.  Did not lose consciousness.          Past Medical History:  Diagnosis Date  . Anemia   . Anxiety   . Arthritis   . Barrett's esophagus   . Bipolar disorder ([place])   . 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 Blood clot in vein   . Cancer ([place])    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  . Carotid arterial disease ([place])   .  Contracture of joint of upper arm   . Depression   . Elevated lipids   . GERD (gastroesophageal reflux disease)   . Hypertension   . Migraine   . Osteoporosis   . Poor balance   . Sinus congestion   . Stroke ([place])    x 2  . TIA (transient ischemic attack)   . Vertigo     Patient Active Problem List   Diagnosis Date Noted  . Duodenal nodule 03/17/2020  . Bipolar depression ([place]) 11/17/2019  . Anxiety 11/17/2019  . Generalized abdominal pain   . Traumatic complete tear of right rotator cuff 10/08/2019  . Hypokalemia 10/07/2019  . Hypomagnesemia 10/07/2019  . Dislocation of right shoulder joint 10/06/2019  . NSVT (nonsustained ventricular tachycardia) ([place])   . Ileus ([place]) 10/03/2019  . 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 Post-concussion headache 01/20/2018  . Iron deficiency anemia 10/05/2017  . Carotid stenosis 11/17/2016  . GERD (gastroesophageal reflux disease) 11/17/2016  . Essential hypertension 11/17/2016  . Hyperlipidemia 11/17/2016  . 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 Rotator cuff tendinitis, left 05/20/2016  . History of TIAs 07/17/2015  . 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  TIA (transient ischemic attack) 12/23/2014  . 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 Disorder of bone and articular cartilage 09/29/2014    Past Surgical History:  Procedure Laterality Date  . ABDOMINAL HYSTERECTOMY    . BACK SURGERY    . BREAST BIOPSY Left   . CARPAL TUNNEL RELEASE Bilateral   . CATARACT EXTRACTION W/ INTRAOCULAR LENS  IMPLANT, BILATERAL Bilateral   . CHOLECYSTECTOMY    . COLONOSCOPY WITH PROPOFOL N/A 04/09/2018   Procedure: COLONOSCOPY WITH PROPOFOL;  Surgeon: Jonathon Bellows, MD;  Location: [facility] ENDOSCOPY;  Service: Gastroenterology;  Laterality: N/A;  . crystal cyst removed on left foot    . ESOPHAGOGASTRODUODENOSCOPY (EGD) WITH PROPOFOL N/A 04/09/2018   Procedure:  ESOPHAGOGASTRODUODENOSCOPY (EGD) WITH PROPOFOL;  Surgeon: Jonathon Bellows, MD;  Location: [facility] ENDOSCOPY;  Service: Gastroenterology;  Laterality: N/A;  . ESOPHAGOGASTRODUODENOSCOPY (EGD) WITH PROPOFOL N/A 12/14/2019   Procedure: ESOPHAGOGASTRODUODENOSCOPY (EGD) WITH PROPOFOL;  Surgeon: Lesly Rubenstein, MD;  Location: [facility] ENDOSCOPY;  Service: Endoscopy;  Laterality: N/A;  . EYE SURGERY    . femoral fx    . FOOT SURGERY    . GIVENS CAPSULE STUDY N/A 06/16/2018   Procedure: GIVENS CAPSULE STUDY;  Surgeon: Jonathon Bellows, MD;  Location: [facility] ENDOSCOPY;  Service: Gastroenterology;  Laterality: N/A;  . JOINT REPLACEMENT    . KNEE SURGERY    . ORIF PERIPROSTHETIC FRACTURE Right 10/07/2019   Procedure: OPEN REDUCTION INTERNAL FIXATION (ORIF) PERIPROSTHETIC FRACTURE;  Surgeon: Corky Mull, MD;  Location: [facility] ORS;  Service: Orthopedics;  Laterality: Right;  . TONSILLECTOMY    . TOTAL KNEE ARTHROPLASTY Bilateral   . TOTAL SHOULDER ARTHROPLASTY Left 08/01/2016   Procedure: TOTAL SHOULDER ARTHROPLASTY;  Surgeon: Corky Mull, MD;  Location: [facility] ORS;  Service: Orthopedics;  Laterality: Left;  . TOTAL SHOULDER ARTHROPLASTY Right 09/30/2019   Procedure: TOTAL SHOULDER ARTHROPLASTY;  Surgeon: Corky Mull, MD;  Location: [facility] ORS;  Service: Orthopedics;  Laterality: Right;    Prior to Admission medications   Medication Sig Start Date End Date Taking? Authorizing Provider  Carboxymethylcellulose Sodium (LUBRICANT EYE DROPS OP) Place 1 drop into both eyes daily as needed (Dry eye). Systane    [provider]  citalopram (CELEXA) 40 MG tablet Take 1 tablet (40 mg total) by mouth daily. 03/17/20   Johnson, Megan P, DO  diclofenac Sodium (VOLTAREN) 1 % GEL Apply 4 g topically 4 (four) times daily. 03/17/20   Johnson, Megan P, DO  diphenhydrAMINE (BENADRYL) 25 MG tablet Take 25 mg by mouth every 6 (six) hours as needed for itching.    [provider]  fluticasone (FLONASE) 50 MCG/ACT nasal spray  Place 2 sprays into both nostrils daily as needed for allergies or rhinitis. 05/10/20   Johnson, Megan P, DO  lamoTRIgine (LAMICTAL) 200 MG tablet Take 1 tablet (200 mg total) by mouth daily. 03/17/20   Johnson, Megan P, DO  omeprazole (PRILOSEC) 40 MG capsule Take 1-2 capsules (40-80 mg total) by mouth daily. 03/17/20   Johnson, Megan P, DO  ondansetron (ZOFRAN ODT) 4 MG disintegrating tablet Take 1 tablet (4 mg total) by mouth every 8 (eight) hours as needed for nausea or vomiting. Patient not taking: Reported on 12/15/2019 11/18/19   Earleen Newport, MD  ondansetron (ZOFRAN-ODT) 4 MG disintegrating tablet Take 1 tablet (4 mg total) by mouth every 8 (eight) hours as needed for nausea or vomiting. 3 times daily as needed Patient not taking: Reported on 03/17/2020 11/11/19   Merrie Roof  Elizabeth, PA-C  polyethylene glycol powder (GLYCOLAX/MIRALAX) 17 GM/SCOOP powder Take 17 g by mouth 2 (two) times daily as needed. 03/17/20   Johnson, Megan P, DO  promethazine (PHENERGAN) 12.5 MG tablet Take 1 tablet (12.5 mg total) by mouth every 8 (eight) hours as needed for nausea or vomiting. 01/20/20   Kathrine Haddock, NP  Travoprost, BAK Free, (TRAVATAN) 0.004 % SOLN ophthalmic solution Place 1 drop into both eyes at bedtime. 08/10/19   [provider]    Allergies Hydroxyzine, Gabapentin, Dilaudid [hydromorphone], Hydrocodone, Ketorolac, Percocet [oxycodone-acetaminophen], and Toradol [ketorolac tromethamine]  Family History  Problem Relation Age of Onset  . Other Mother        ?lupus   . Cancer Mother   . High Cholesterol Mother   . CAD Father        CABG  . High Cholesterol Father   . Arthritis Father   . Cancer Sister   . Heart murmur Sister   . Heart murmur Brother   . High blood pressure Other        "for everybody"  . High Cholesterol Other        "for everybody"    Social History Social History   Tobacco Use  . Smoking status: Current Every Day Smoker    Packs/day: 0.50     Types: Cigarettes    Last attempt to quit: 08/04/2016    Years since quitting: 3.7  . Smokeless tobacco: Never Used  Vaping Use  . Vaping Use: Never used  Substance Use Topics  . Alcohol use: No  . Drug use: No      Review of Systems Constitutional: No fever/chills Eyes: No visual changes. ENT: No sore throat. Cardiovascular: Denies chest pain but some tightness patient reports earlier Respiratory: Denies shortness of breath. Gastrointestinal: No abdominal pain.  No nausea, no vomiting.  No diarrhea.  No constipation. Genitourinary: Negative for dysuria. Musculoskeletal: Negative for back pain.  Right shoulder pain, right hand pain Skin: Negative for rash. Neurological: Negative for headaches, focal weakness or numbness. All other ROS negative ____________________________________________   PHYSICAL EXAM:  VITAL SIGNS: ED Triage Vitals  Enc Vitals Group     BP 05/12/20 1343 (!) 169/64     Pulse Rate 05/12/20 1343 (!) 51     Resp 05/12/20 1343 (!) 22     Temp 05/12/20 1343 97.7 F (36.5 C)     Temp Source 05/12/20 1343 Oral     SpO2 05/12/20 1343 100 %     Weight 05/12/20 1349 142 lb (64.4 kg)     Height 05/12/20 1349 5\' 2"  (1.575 m)     Head Circumference --      Peak Flow --      Pain Score 05/12/20 1349 9     Pain Loc --      Pain Edu? --      Excl. in [place]? --     Constitutional: Alert and oriented. Well appearing and in no acute distress. Eyes: Conjunctivae are normal. EOMI. Head: Atraumatic. Nose: No congestion/rhinnorhea.   Mouth/Throat: Mucous membranes are moist.   Neck: No stridor. Trachea Midline. FROMNo C-spine tenderness Cardiovascular: Normal rate, regular rhythm. Grossly normal heart sounds.  Good peripheral circulation.  No chest wall tenderness, no tenderness over the right ribs. Respiratory: Normal respiratory effort.  No retractions. Lungs CTAB. Gastrointestinal: Soft and nontender. No distention. No abdominal bruits.  Musculoskeletal: No lower  extremity tenderness nor edema.  No joint effusions. Neurologic:  Normal speech and  language. No gross focal neurologic deficits are appreciated.  Equal sensation in her bilateral arms and legs with equal strength.  She is got some tenderness on her right upper femur with some mild bruising.  She reports some tenderness at her right shoulder but still able to range it. Skin:  Skin is warm, dry and intact. No rash noted. Psychiatric: Mood and affect are normal. Speech and behavior are normal. GU: Deferred   ____________________________________________   LABS (all labs ordered are listed, but only abnormal results are displayed)  Labs Reviewed - No data to display ____________________________________________   ED ECG REPORT I, Vanessa Jacumba, the attending physician, personally viewed and interpreted this ECG.  EKG is sinus bradycardia rate of 51, no ST elevation, some nonspecific T wave flattening/inversions but looks similar to prior EKGs, normal intervals ____________________________________________  RADIOLOGY I, Vanessa Dawson, personally viewed and evaluated these images (plain radiographs) as part of my medical decision making, as well as reviewing the written report by the radiologist.  ED MD interpretation: No fracture noted  Official radiology report(s): DG Shoulder Right  Result Date: 05/12/2020 CLINICAL DATA:  [place] today.  Right shoulder and hip pain. EXAM: RIGHT SHOULDER - 2+ VIEW COMPARISON:  10/07/2019. FINDINGS: No acute fracture. Right shoulder arthroplasty appears well seated and aligned. There is sclerosis along the inferior margin of the scapula, stable from the prior study. No osteolytic lesions. AC joint normally aligned. Soft tissues are unremarkable. IMPRESSION: 1. No fracture or dislocation. 2. No evidence of loosening of the orthopedic hardware. Electronically Signed   By: Lajean Manes M.D.   On: 05/12/2020 14:55   DG Hip Unilat  With Pelvis 2-3 Views Right  Result  Date: 05/12/2020 CLINICAL DATA:  Pt fell today and is having right shoulder and right hip pain and numbness. States she is unsure how she fell but states she did not have any LOC, states she had a very hard time getting herself up out of the floor. EXAM: DG HIP (WITH OR WITHOUT PELVIS) 2-3V RIGHT COMPARISON:  None. FINDINGS: No fracture or bone lesion. Hip joints, SI joints and symphysis pubis are normally spaced and aligned. Disc degenerative changes are noted at L4-L5. Soft tissues are unremarkable. IMPRESSION: No fracture or hip joint abnormality. Electronically Signed   By: Lajean Manes M.D.   On: 05/12/2020 14:49    ____________________________________________   PROCEDURES  Procedure(s) performed (including Critical Care):  Procedures   ____________________________________________   INITIAL IMPRESSION / ASSESSMENT AND PLAN / ED COURSE  Raynette Cliffe was evaluated in Emergency Department on 05/12/2020 for the symptoms described in the history of present illness. She was evaluated in the context of the global COVID-19 pandemic, which necessitated consideration that the patient might be at risk for infection with the SARS-CoV-2 virus that causes COVID-19. Institutional protocols and algorithms that pertain to the evaluation of patients at risk for COVID-19 are in a state of rapid change based on information released by regulatory bodies including the CDC and federal and state organizations. These policies and algorithms were followed during the patient's care in the ED.     We will get x-rays to evaluate for fractures.  We will ambulate patient to make sure no occult fracture.  Patient was not on the ground a long time to suggest kidney injury or rhabdo.  She did not hit her head or have cervical neck tenderness distress intracranial hemorrhage or cervical fracture.  She is got no chest wall tenderness to suggest rib  fracture or abdominal tenderness to suggest abdominal injury.  She was  reported to have chest pain in triage but she states that it was just because she was feeling anxious about whether or not she had an injury.  She is willing to do EKG but declines blood work at this time.  She is chest pain-free at this time.  She understands that if your chest pain comes back that she needs to return to the ER immediately.  Ishe reports the tingling in her right arm.  She has no cervical spine tenderness to suggest cervical fracture and the symptoms are resolving I suspect that it was just from her pulling at her right shoulder.  Her symptoms are resolving.  X-rays are negative.  Patient will take Tylenol and ibuprofen for pain.  Patient is able to ambulate with a walker.  Patient is able to ambulate around the room.      ____________________________________________   FINAL CLINICAL IMPRESSION(S) / ED DIAGNOSES   Final diagnoses:  Fall, initial encounter      MEDICATIONS GIVEN DURING THIS VISIT:  Medications - No data to display   ED Discharge Orders    None       Note:  This document was prepared using Dragon voice recognition software and may include unintentional dictation errors.   Vanessa Laurel, MD 05/12/20 2002

## 2020-05-12 NOTE — Discharge Instructions (Signed)
Take Tylenol 1 g every 8 hours and ibuprofen 600 every 6 hours to help with your pain.  Return to the ER if you develop  chest pain or any other concerns

## 2020-05-12 NOTE — ED Triage Notes (Signed)
Pt fell today and is having right shoulder, rib, hip pain and numbness. States she is unsure how she fell but states she did not have any LOC, states she had a very hard time getting herself up out of the floor., states at first it was just the right leg, now the whole right side of her body hurts and is having tight ness in her chest "like an elephant is sitting on it".. pt is laying in the recliner on her left side to relieve the pain, pt is groaning and tachypneic at present.

## 2020-05-12 NOTE — ED Notes (Signed)
Patient in recliner, patient states she is sore with pain 6/10.  Patient ambulated in room with this RN. Patient states "I told myself to suck it up and now I am in no pain". Patient able to ambulate without pain.

## 2020-05-19 ENCOUNTER — Telehealth: Payer: Self-pay

## 2020-05-19 ENCOUNTER — Telehealth: Payer: Self-pay | Admitting: Licensed Clinical Social Worker

## 2020-05-19 NOTE — Telephone Encounter (Signed)
  Chronic Care Management    Clinical Social Work General Follow Up Note  05/19/2020 Name: Sydney Evans MRN: [id number] DOB: [birth year]  Sydney Evans is a 70 y.o. year old female who is a primary care patient of Volney American, [place]. The CCM team was consulted for assistance with [employer] .   Review of patient status, including review of consultants reports, relevant laboratory and other test results, and collaboration with appropriate care team members and the patient's provider was performed as part of comprehensive patient evaluation and provision of chronic care management services.    LCSW completed CCM outreach attempt today but was unable to reach patient successfully. A HIPPA compliant voice message was left encouraging patient to return call once available. LCSW will ask Scheduling Care Guide to reschedule CCM SW appointment with patient as well.   Outpatient Encounter Medications as of 05/19/2020  Medication Sig Note  . Carboxymethylcellulose Sodium (LUBRICANT EYE DROPS OP) Place 1 drop into both eyes daily as needed (Dry eye). Systane   . citalopram (CELEXA) 40 MG tablet Take 1 tablet (40 mg total) by mouth daily.   . diclofenac Sodium (VOLTAREN) 1 % GEL Apply 4 g topically 4 (four) times daily.   . diphenhydrAMINE (BENADRYL) 25 MG tablet Take 25 mg by mouth every 6 (six) hours as needed for itching.   . fluticasone (FLONASE) 50 MCG/ACT nasal spray Place 2 sprays into both nostrils daily as needed for allergies or rhinitis.   [name] lamoTRIgine (LAMICTAL) 200 MG tablet Take 1 tablet (200 mg total) by mouth daily.   [name] omeprazole (PRILOSEC) 40 MG capsule Take 1-2 capsules (40-80 mg total) by mouth daily.   . ondansetron (ZOFRAN ODT) 4 MG disintegrating tablet Take 1 tablet (4 mg total) by mouth every 8 (eight) hours as needed for nausea or vomiting. (Patient not taking: Reported on 12/15/2019)   . ondansetron (ZOFRAN-ODT) 4 MG disintegrating tablet Take 1 tablet (4 mg  total) by mouth every 8 (eight) hours as needed for nausea or vomiting. 3 times daily as needed (Patient not taking: Reported on 03/17/2020) 12/15/2019: Patient taking differently. Taking 2 tablets by mouth 3 times daily (24 mg total daily)  . polyethylene glycol powder (GLYCOLAX/MIRALAX) 17 GM/SCOOP powder Take 17 g by mouth 2 (two) times daily as needed.   . promethazine (PHENERGAN) 12.5 MG tablet Take 1 tablet (12.5 mg total) by mouth every 8 (eight) hours as needed for nausea or vomiting.   . Travoprost, BAK Free, (TRAVATAN) 0.004 % SOLN ophthalmic solution Place 1 drop into both eyes at bedtime.    No facility-administered encounter medications on file as of 05/19/2020.   Follow Up Plan: [facility] will reach out to patient to reschedule appointment.  Eula Fried, BSW, MSW, [facility] Care [facility] [email or web address] Phone: [phone]

## 2020-05-29 NOTE — Telephone Encounter (Signed)
Pt has been rescheduled. 

## 2020-06-07 ENCOUNTER — Ambulatory Visit (INDEPENDENT_AMBULATORY_CARE_PROVIDER_SITE_OTHER): Payer: Medicare Other | Admitting: General Practice

## 2020-06-07 ENCOUNTER — Telehealth: Payer: Self-pay | Admitting: General Practice

## 2020-06-07 DIAGNOSIS — K5909 Other constipation: Secondary | ICD-10-CM

## 2020-06-07 DIAGNOSIS — R112 Nausea with vomiting, unspecified: Secondary | ICD-10-CM

## 2020-06-07 DIAGNOSIS — R197 Diarrhea, unspecified: Secondary | ICD-10-CM

## 2020-06-07 DIAGNOSIS — I10 Essential (primary) hypertension: Secondary | ICD-10-CM

## 2020-06-07 DIAGNOSIS — R110 Nausea: Secondary | ICD-10-CM

## 2020-06-07 DIAGNOSIS — F419 Anxiety disorder, unspecified: Secondary | ICD-10-CM

## 2020-06-07 DIAGNOSIS — F319 Bipolar disorder, unspecified: Secondary | ICD-10-CM

## 2020-06-07 NOTE — Chronic Care Management (AMB) (Signed)
Chronic Care Management   CCM RN Visit Note  06/07/2020 Name: Sydney Evans MRN: [id number] DOB: 1951/01/25  Subjective: Sydney Evans is a 70 y.o. year old female who is a primary care patient of Jon Billings, NP. The care management team was consulted for assistance with disease management and care coordination needs.    Engaged with patient by telephone for follow up visit in response to provider referral for case management and/or care coordination services.   Consent to Services:  The patient was given information about Chronic Care Management services, agreed to services, and gave verbal consent prior to initiation of services.  Please see initial visit note for detailed documentation.   Patient agreed to services and verbal consent obtained.   Assessment: Review of patient past medical history, allergies, medications, health status, including review of consultants reports, laboratory and other test data, was performed as part of comprehensive evaluation and provision of chronic care management services.   SDOH (Social Determinants of Health) assessments and interventions performed:    CCM Care Plan  Allergies  Allergen Reactions   Hydroxyzine Hives and Rash   Gabapentin     Dizzy and confusion   Dilaudid [Hydromorphone] Itching   Hydrocodone Hives and Rash    "terrible scratching"  Make take with benadryl   Ketorolac Rash     May take with benadryl   Percocet [Oxycodone-Acetaminophen] Itching and Rash    May take with benadryl     Toradol [Ketorolac Tromethamine] Rash    May take with benadryl    Outpatient Encounter Medications as of 06/07/2020  Medication Sig Note   Carboxymethylcellulose Sodium (LUBRICANT EYE DROPS OP) Place 1 drop into both eyes daily as needed (Dry eye). Systane    citalopram (CELEXA) 40 MG tablet Take 1 tablet (40 mg total) by mouth daily.    diclofenac Sodium (VOLTAREN) 1 % GEL Apply 4 g topically 4 (four) times daily.     diphenhydrAMINE (BENADRYL) 25 MG tablet Take 25 mg by mouth every 6 (six) hours as needed for itching.    fluticasone (FLONASE) 50 MCG/ACT nasal spray Place 2 sprays into both nostrils daily as needed for allergies or rhinitis.    lamoTRIgine (LAMICTAL) 200 MG tablet Take 1 tablet (200 mg total) by mouth daily.    omeprazole (PRILOSEC) 40 MG capsule Take 1-2 capsules (40-80 mg total) by mouth daily.    ondansetron (ZOFRAN ODT) 4 MG disintegrating tablet Take 1 tablet (4 mg total) by mouth every 8 (eight) hours as needed for nausea or vomiting. (Patient not taking: Reported on 12/15/2019)    ondansetron (ZOFRAN-ODT) 4 MG disintegrating tablet Take 1 tablet (4 mg total) by mouth every 8 (eight) hours as needed for nausea or vomiting. 3 times daily as needed (Patient not taking: Reported on 03/17/2020) 12/15/2019: Patient taking differently. Taking 2 tablets by mouth 3 times daily (24 mg total daily)   polyethylene glycol powder (GLYCOLAX/MIRALAX) 17 GM/SCOOP powder Take 17 g by mouth 2 (two) times daily as needed.    promethazine (PHENERGAN) 12.5 MG tablet Take 1 tablet (12.5 mg total) by mouth every 8 (eight) hours as needed for nausea or vomiting.    Travoprost, BAK Free, (TRAVATAN) 0.004 % SOLN ophthalmic solution Place 1 drop into both eyes at bedtime.    No facility-administered encounter medications on file as of 06/07/2020.    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   Post-concussion headache 01/20/2018   Iron deficiency anemia 10/05/2017    Carotid stenosis 11/17/2016   GERD (gastroesophageal reflux disease) 11/17/2016   Essential hypertension 11/17/2016   Hyperlipidemia 11/17/2016   Status post total shoulder replacement, 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   Rotator cuff tendinitis, left 05/20/2016   History of TIAs 07/17/2015   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   TIA (transient ischemic attack) 12/23/2014   Localized, 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   Disorder of bone and articular cartilage 09/29/2014    Conditions to be addressed/monitored:HTN, Anxiety, Depression, Bipolar Disorder and nausea, diarrhea and at times constipation   Care Plan : RNCM: Hypertension (Adult)  Updates made by Vanita Ingles since 06/07/2020 12:00 AM    Problem: RNCM: Hypertension (Hypertension)   Priority: Medium    Goal: RNCM: Hypertension Monitored   Priority: Medium  Note:   Objective:   Last practice recorded BP readings:  BP Readings from Last 3 Encounters:  05/12/20 (!) 166/71  03/17/20 131/71  12/15/19 (!) 161/75     Most recent eGFR/CrCl: No results found for: EGFR  No components found for: CRCL Current Barriers:   Knowledge Deficits related to basic understanding of hypertension pathophysiology and self care management  Knowledge Deficits related to understanding of medications prescribed for management of hypertension  Lacks social connections  Does not contact provider office for questions/concerns Case Manager Clinical Goal(s):   Over the next 120 days, patient will verbalize understanding of plan for hypertension management  Over the next 120 days, patient will demonstrate improved adherence to prescribed treatment plan for hypertension as evidenced by taking all medications as prescribed, monitoring and recording blood pressure as directed, adhering to low sodium/DASH diet  Over the next 120 days, patient will demonstrate improved health management independence as evidenced by checking  blood pressure as directed and notifying PCP if SBP>160 or DBP > 90, taking all medications as prescribe, and adhering to a low sodium diet as discussed. Interventions:   Collaboration with Jon Billings, NP regarding development and update of comprehensive plan of care as evidenced by provider attestation and co-signature  Inter-disciplinary care team collaboration (see longitudinal plan of care)  Evaluation of current treatment plan related to hypertension self management and patient's adherence to plan as established by provider.  Provided education to patient re: stroke prevention, s/s of heart attack and stroke, DASH diet, complications of uncontrolled blood pressure  Reviewed medications with patient and discussed importance of compliance  Discussed plans with patient for ongoing care management follow up and provided patient with direct contact information for care management team  Advised patient, providing education and rationale, to monitor blood pressure daily and record, calling PCP for findings outside established parameters. The patient states she has been taking her blood pressure and writing down the values but she did not have her book near at the time of the call. Will continue to monitor.  Patient Goals/Self-Care Activities  Over the next 120 days, patient will:  - Self administers medications as prescribed Attends all scheduled provider appointments Calls provider office for new concerns, questions, or BP outside discussed parameters Checks BP and records as discussed Follows a low sodium diet/DASH diet -  blood pressure trends reviewed - depression screen reviewed - home or ambulatory blood pressure monitoring encouraged  Follow Up Plan: Telephone follow up appointment with care management team member scheduled for: 07-26-2020 at 1 pm   Task: RNCM: Identify and Monitor Blood Pressure Elevation   Note:   Care Management Activities:    - blood pressure trends  reviewed - depression screen reviewed - home or ambulatory blood pressure monitoring encouraged     Care Plan : RNCM: Anxiety, bipolar Depression (Adult)  Updates made by Vanita Ingles since 06/07/2020 12:00 AM    Problem: RNCM: Anxiety, and Bipolar Depression Identification (Depression)   Priority: Medium    Goal: RNCM: Depressive Symptoms Identified   Priority: Medium  Note:   Current Barriers:   Chronic Disease Management support and education needs related to effective management of depression and anxiety   Unable to independently manage anxiety and depression   Does not contact provider office for questions/concerns  Nurse Case Manager Clinical Goal(s):   Over the next 120 days, patient will verbalize understanding of plan for effective management of depression and anxiety   Over the next 120 days, patient will work with RNCM, and pcp  to address needs related to changes in mood/anxiety/depression   Over the next 120 days, patient will demonstrate a decrease in depressive mood exacerbations as evidenced by normalized and baseline status of mood and mentation.   Interventions:   1:1 collaboration with Jon Billings, NP regarding development and update of comprehensive plan of care as evidenced by provider attestation and co-signature  Inter-disciplinary care team collaboration (see longitudinal plan of care)  Evaluation of current treatment plan related to bipolar depression and anxiety  and patient's adherence to plan as established by provider.  Advised patient to call the office for changes in mood/depression/anxiety  Provided education to patient re: effective management of depression and anxiety and the ability to send information to the patient through the my chart functionality.   Discussed plans with patient for ongoing care management follow up and provided patient with direct contact information for care management team  Patient Goals/Self-Care  Activities Over the next 120  days, patient will:  - Patient will self administer medications as prescribed Patient will attend all scheduled provider appointments Patient will call pharmacy for medication refills Patient will attend church or other social activities Patient will continue to perform ADL's independently Patient will continue to perform IADL's independently Patient will call provider office for new concerns or questions Patient will work with BSW to address care coordination needs and will continue to work with the clinical team to address health care and disease management related needs.   - anxiety screen reviewed - depression screen reviewed  Follow Up Plan: Telephone follow up appointment with care management team member scheduled for: 07-26-2020 at 1 pm       Task: RNCM: Identify Depressive Symptoms and Facilitate Treatment   Note:   Care Management Activities:    - anxiety screen reviewed - depression screen reviewed       Care Plan : RNCM: Management of GI symptoms: Chronic Nausea/Diarrhea  Updates made by Vanita Ingles since 06/07/2020 12:00 AM    Problem: RNCM: Management of GI Issues: Diarrhea/Nausea/Constipation   Priority: High    Goal: RNCM: Diarrhea/Nausea/Constipation  Managed   Priority: High  Note:   Current Barriers:   Knowledge Deficits related to etiology of bacteria in her small intestines and why the abx is not getting rid of the  bacteria   Chronic Disease Management support and education needs related to effective management of GI flare ups/chronic nausea/diarrhea and sometimes constipation  Lacks caregiver support.   Unable to independently manage chronic gi symptoms   Unable to self administer medications as prescribed  Lacks social connections  Does not contact provider office for questions/concerns  Nurse Case Manager Clinical Goal(s):   Over the next 120 days, patient will verbalize understanding of plan for effective  management of GI issues  Over the next 120 days, patient will work with RNCM, pcp, and specialist  to address needs related to Chronic nausea, diarrhea, and constipation   Over the next 120 days, patient will demonstrate a decrease in nausea and GI symptom  exacerbations as evidenced by no evidence of nausea, bowel habits within normal limits, and tolerating po intake without difficulty  Interventions:   1:1 collaboration with Jon Billings, NP regarding development and update of comprehensive plan of care as evidenced by provider attestation and co-signature  Inter-disciplinary care team collaboration (see longitudinal plan of care)  Evaluation of current treatment plan related to Nausea/diarrhea/constipation  and patient's adherence to plan as established by provider.  Advised patient to write down questions to ask the GI specialist. The patient states she is calling the GI provider tomorrow. She was on the high potency ABX for the bacteria found in her small intestines and each time she gets near the dose of the ABX she starts having the bad symptoms she was having. She is currently on her second round of ABX therapy. She wants to know why she is still having the symptoms and why the medication is not taking care of the diarrhea. Empathetic listening and support given.   Provided education to patient re: discussing concerns with the GI Specialist and the ability for the [facility] to provide educational material by my chart system and EMMI  Reviewed medications with patient and discussed : compliance and the use of Xifanxin 550 mg TID for 2 weeks. The patient states she has been on 2 rounds of the medication and symptoms improve until she ends therapy. She wants to talk to the provider about what is causing the bacteria and why the medication is not getting rid of it.   Discussed plans with patient for ongoing care management follow up and provided patient with direct contact information for  care management team  Patient Goals/Self-Care Activities Over the next 120 days, patient will:  - Patient will self administer medications as prescribed Patient will attend all scheduled provider appointments Patient will call pharmacy for medication refills Patient will attend church or other social activities Patient will continue to perform ADL's independently Patient will continue to perform IADL's independently Patient will call provider office for new concerns or questions Patient will work with BSW to address care coordination needs and will continue to work with the clinical team to address health care and disease management related needs.   - diet adjustment recommended - fluid status assessed and trended - medication side effects managed - response to pharmacologic therapy monitored - weight assessed and trended  Follow Up Plan: Telephone follow up appointment with care management team member scheduled for: 07-26-2020 at 1 pm       Task: RNCM: Alleviate Barriers to Diarrhea Management   Note:   Care Management Activities:    - diet adjustment recommended - fluid status assessed and trended - medication side effects managed - response to pharmacologic therapy monitored - weight assessed and trended  Plan:Telephone follow up appointment with care management team member scheduled for:  07-26-2020 at 1 pm  Noreene Larsson RN, MSN, [facility] Mobile: [phone]

## 2020-06-07 NOTE — Patient Instructions (Signed)
Visit Information  PATIENT GOALS: Goals Addressed              This Visit's Progress   .  COMPLETED: RNCM: Pt's niece: "They can not figure out why she is having nausea and stomach issues" (pt-stated)        CARE PLAN ENTRY (see longtitudinal plan of care for additional care plan information)  Current Barriers: Closing this and opening in new ELS . Chronic Disease Management support, education, and care coordination needs related to HTN, HLD, Bipolar Depression, and Chronic nausea, protein malnutrition   . Clinical Goal(s) related to : HTN, HLD, Bipolar Depression, and Chronic nausea, protein malnutrition   Over the next 120 days, patient will:  . Work with the care management team to address educational, disease management, and care coordination needs  . Begin or continue self health monitoring activities as directed today Measure and record blood pressure 3/4 times per week and monitor for dehydration  and adhere to a heart healthy diet . Call provider office for new or worsened signs and symptoms Blood pressure findings outside established parameters and New or worsened symptom related to Chronic nausea, protein malnutrition and other chronic conditions . Call care management team with questions or concerns . Verbalize basic understanding of patient centered plan of care established today  . Interventions related to : HTN, HLD, Bipolar Depression, and Chronic nausea, protein malnutrition   . Evaluation of current treatment plans and patient's adherence to plan as established by provider.  Spoke with the patients niece. She is waiting for specialty appointments at [facility] for more testing to determine the cause of the chronic nausea and GI upset the patient is having. She is supposed to have a hydrogen break test and a EUS at [facility]. The patients niece is going to try and call today to follow up with referrals. The niece helps coordinate care with the patient and her appointments.  04-19-2020: The patient had a SIBO test and it was positive. She is taking Xifanxin 550 mg TID for 2 weeks.  She was told she has a bad bacteria growing and this will help get rid of it. The patients niece says they are optimistic that this is going to help her considerably.  . Assessed patient understanding of disease states.  The patients niece has a good understanding of the patients chronic conditions. The patient has several chronic issues. She has good days and bad days. The patients niece states she sees the patient almost daily sometimes every day. She has the patients service dog because the dog is not fully trained. She also states that she has a lot going on but she is doing her best to keep everything going.   . Assessed patient's education and care coordination needs.  Currently the patient has PT working with her, has MOW, has someone coming in and helping with her household jobs. The niece is thankful for the call and appreciates the CCM team.  . Provided disease specific education to patient. Education on the roll of the CCM team and how team works with patient and caregivers to meet the goals of health and well being for the patient.  [name] with appropriate clinical care team members regarding patient needs.  The patients niece states she is working with the LCSW.  Review of pharmacy needs and the niece feels she is doing well with managing this at this time. The niece fills the pill box for the patient weekly. Denies any needs for pharmacy  at this time.  . Evaluation of upcoming provider appointments. No upcoming appointments.  The niece will see about getting a new appointment after Christmas to see the pcp. Has RNCM number to call for changes in condition/questions/concerns.  . Patient Self Care Activities related to : HTN, HLD, Bipolar Depression, and Chronic nausea, protein malnutrition   . Patient is unable to independently self-manage chronic health conditions  Please see  past updates related to this goal by clicking on the "Past Updates" button in the selected goal         Patient verbalizes understanding of instructions provided today and agrees to view in [place].   Telephone follow up appointment with care management team member scheduled for:07-26-2020 at 1 pm  Noreene Larsson RN, MSN, [facility] Mobile: [phone]

## 2020-06-15 ENCOUNTER — Other Ambulatory Visit: Payer: Self-pay | Admitting: Gastroenterology

## 2020-06-15 DIAGNOSIS — R110 Nausea: Secondary | ICD-10-CM

## 2020-06-22 ENCOUNTER — Ambulatory Visit: Payer: Medicare Other | Admitting: Licensed Clinical Social Worker

## 2020-06-22 DIAGNOSIS — I10 Essential (primary) hypertension: Secondary | ICD-10-CM

## 2020-06-22 DIAGNOSIS — E440 Moderate protein-calorie malnutrition: Secondary | ICD-10-CM

## 2020-06-22 DIAGNOSIS — F319 Bipolar disorder, unspecified: Secondary | ICD-10-CM

## 2020-06-22 DIAGNOSIS — F419 Anxiety disorder, unspecified: Secondary | ICD-10-CM

## 2020-06-22 NOTE — Chronic Care Management (AMB) (Signed)
Chronic Care Management    Clinical Social Work Note  06/22/2020 Name: Kaleisha Bhargava MRN: [id number] DOB: [birth year]  Gratia Disla is a 70 y.o. year old female who is a primary care patient of Jon Billings, NP. The CCM team was consulted to assist the patient with chronic disease management and/or care coordination needs related to: Mental Health Counseling and Resources.   Engaged with patient by telephone for follow up visit in response to provider referral for social work chronic care management and care coordination services.   Consent to Services:  The patient was given the following information about Chronic Care Management services today, agreed to services, and gave verbal consent: 1. CCM service includes personalized support from designated clinical staff supervised by the primary care provider, including individualized plan of care and coordination with other care providers 2. 24/7 contact phone numbers for assistance for urgent and routine care needs. 3. Service will only be billed when office clinical staff spend 20 minutes or more in a month to coordinate care. 4. Only one practitioner may furnish and bill the service in a calendar month. 5.The patient may stop CCM services at any time (effective at the end of the month) by phone call to the office staff. 6. The patient will be responsible for cost sharing (co-pay) of up to 20% of the service fee (after annual deductible is met). Patient agreed to services and consent obtained.  Patient agreed to services and consent obtained.   Assessment: Review of patient past medical history, allergies, medications, and health status, including review of relevant consultants reports was performed today as part of a comprehensive evaluation and provision of chronic care management and care coordination services.     SDOH (Social Determinants of Health) assessments and interventions performed:    Advanced Directives Status: See Care Plan  for related entries.  CCM Care Plan  Allergies  Allergen Reactions  . Hydroxyzine Hives and Rash  . Gabapentin     Dizzy and confusion  . Dilaudid [Hydromorphone] Itching  . Hydrocodone Hives and Rash    "terrible scratching"  Make take with benadryl  . Ketorolac Rash     May take with benadryl  . Percocet [Oxycodone-Acetaminophen] Itching and Rash    May take with benadryl    . Toradol [Ketorolac Tromethamine] Rash    May take with benadryl    Outpatient Encounter Medications as of 06/22/2020  Medication Sig Note  . Carboxymethylcellulose Sodium (LUBRICANT EYE DROPS OP) Place 1 drop into both eyes daily as needed (Dry eye). Systane   . citalopram (CELEXA) 40 MG tablet Take 1 tablet (40 mg total) by mouth daily.   . diclofenac Sodium (VOLTAREN) 1 % GEL Apply 4 g topically 4 (four) times daily.   . diphenhydrAMINE (BENADRYL) 25 MG tablet Take 25 mg by mouth every 6 (six) hours as needed for itching.   . fluticasone (FLONASE) 50 MCG/ACT nasal spray Place 2 sprays into both nostrils daily as needed for allergies or rhinitis.   [name] lamoTRIgine (LAMICTAL) 200 MG tablet Take 1 tablet (200 mg total) by mouth daily.   [name] omeprazole (PRILOSEC) 40 MG capsule Take 1-2 capsules (40-80 mg total) by mouth daily.   . ondansetron (ZOFRAN ODT) 4 MG disintegrating tablet Take 1 tablet (4 mg total) by mouth every 8 (eight) hours as needed for nausea or vomiting. (Patient not taking: Reported on 12/15/2019)   . ondansetron (ZOFRAN-ODT) 4 MG disintegrating tablet Take 1 tablet (4 mg total) by mouth every 8 (  eight) hours as needed for nausea or vomiting. 3 times daily as needed (Patient not taking: Reported on 03/17/2020) 12/15/2019: Patient taking differently. Taking 2 tablets by mouth 3 times daily (24 mg total daily)  . polyethylene glycol powder (GLYCOLAX/MIRALAX) 17 GM/SCOOP powder Take 17 g by mouth 2 (two) times daily as needed.   . promethazine (PHENERGAN) 12.5 MG tablet Take 1 tablet (12.5 mg total) by  mouth every 8 (eight) hours as needed for nausea or vomiting.   . Travoprost, BAK Free, (TRAVATAN) 0.004 % SOLN ophthalmic solution Place 1 drop into both eyes at bedtime.    No facility-administered encounter medications on file as of 06/22/2020.    Patient Active Problem List   Diagnosis Date Noted  . Duodenal nodule 03/17/2020  . Bipolar depression ([place]) 11/17/2019  . Anxiety 11/17/2019  . Generalized abdominal pain   . Traumatic complete tear of right rotator cuff 10/08/2019  . Hypokalemia 10/07/2019  . Hypomagnesemia 10/07/2019  . Dislocation of right shoulder joint 10/06/2019  . NSVT (nonsustained ventricular tachycardia) ([place])   . Ileus ([place]) 10/03/2019  . 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 Post-concussion headache 01/20/2018  . Iron deficiency anemia 10/05/2017  . Carotid stenosis 11/17/2016  . GERD (gastroesophageal reflux disease) 11/17/2016  . Essential hypertension 11/17/2016  . Hyperlipidemia 11/17/2016  . 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 Rotator cuff tendinitis, left 05/20/2016  . History of TIAs 07/17/2015  . 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 TIA (transient ischemic attack) 12/23/2014  . 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 Disorder of bone and articular cartilage 09/29/2014    Conditions to be addressed/monitored: Depression; Mental Health Concerns   Care Plan : General Social Work (Adult)  Updates made by Greg Cutter, LCSW since 06/22/2020 12:00 AM    Problem: Quality of Life (General Plan of Care)     Long-Range Goal: Quality of Life Maintained   Start Date: 06/22/2020  Priority: Medium  Note:   Evidence-based guidance:   Assess patient's thoughts about quality of life, goals and expectations, and dissatisfaction or desire to improve.   Identify issues of primary importance such as mental health, illness, exercise tolerance, pain, sexual function and  intimacy, cognitive change, social isolation, finances and relationships.   Assess and monitor for signs/symptoms of psychosocial concerns, especially depression or ideations regarding harm to others or self; provide or refer for mental health services as needed.   Identify sensory issues that impact quality of life such as hearing loss, vision deficit; strategize ways to maintain or improve hearing, vision.   Promote access to services in the community to support independence such as support groups, home visiting programs, financial assistance, handicapped parking tags, durable medical equipment and emergency responder.   Promote activities to decrease social isolation such as group support or social, leisure and recreational activities, employment, use of social media; consider safety concerns about being out of home for activities.   Provide patient an opportunity to share by storytelling or a "life review" to give positive meaning to life and to assist with coping and negative experiences.   Encourage patient to tap into hope to improve sense of self.   Counsel based on prognosis and as early as possible about end-of-life and palliative care; consider referral to palliative care provider.   Advocate for the development of palliative care plan that may include avoidance of unnecessary testing and intervention, symptom control, discontinuation of medications, hospice and organ  donation.   Counsel as early as possible those with life-limiting chronic disease about palliative care; consider referral to palliative care provider.   Advocate for the development of palliative care plan.   Notes:   Timeframe:  Long-Range Goal Priority:  Medium  Start Date:  06/22/20                         Expected End Date:  09/19/20                    Follow Up Date- 08/21/20  Current Barriers:  . Chronic Mental Health needs related to bipolar, depression and anxiety . Limited social  support . Transportation . ADL IADL limitations . Mental Health Concerns  . Social Isolation . Memory Deficits . Inability to perform ADL's independently . Inability to perform IADL's independently . Suicidal Ideation/Homicidal Ideation: No  Clinical Social Work Goal(s):  [name] Over the next 120 days, patient/caregiver will work with SW to address concerns related to gaining additional support/resource connection in order to maintain health and mental health appropriately  . Over the next 120 days, patient will demonstrate improved adherence to self care as evidenced by implementing healthy self-care into her daily routine such as: attending all medical appointments, contacting mental health resources that were emailed, deep breathing exercises, taking time for self-reflection, taking medications as prescribed, drinking water and daily exercise to improve mobility.  . Over the next 120 days, patient will work with SW  and therapist bi-monthly by telephone or in person to reduce or manage symptoms related to anxiety and depression.  . Over the next 120 days, patient will demonstrate improved health management independence as evidenced by implementing healthy self-care and positive support/resources into her daily routine to cope with stressors/mental health symptoms and improve overall health and well-being   Interventions: . Patient interviewed and appropriate assessments performed: brief mental health assessment . LCSW spoke with patient on 06/22/20. Patient reports that she is no longer experiencing any depressive symptoms and does not need mental health resource implementation at this time. Patient recently changed primary care providers. Patient lives alone but has niece come check on her multiple times per day. Niece or grandchildren will sometimes stay the night with patient if needed. Patient's daughter have not been involved with her care which has upset patient.  . Patient complaints of  ongoing muscle weakness. Patient has had recent falls. Patient is currently receiving PT in the home. Patient lost her service dog in January of this year and is unable to take appropriate care of their new service dog that is in training so patient's niece has agreed to take in this new service dog in and allow it to visit patient during the day to help lift patient's mood. . Patient interviewed and appropriate assessments performed . Provided mental health counseling with regard to *depression and anxiety management.  Email sent to niece with available mental health resource list. Patient was encouraged to utilize these resources if her depressive symptoms return. Education provided on coping skill and depression management.  . Provided patient with information about available socialization opportunities within patient's nearby area. Email sent to niece with information on these [facility]. Patient reports receiving socialization through her neighbor that comes over daily and by going on outings with her niece.  . Discussed plans with patient for ongoing care management follow up and provided patient with direct contact information for care management team . Advised  patient to check email and to contact CCM LCSW for any future social work related needs.  . Assisted patient/caregiver with obtaining information about health plan benefits . Provided education and assistance to client regarding Advanced Directives. . Provided education to patient/caregiver about Hospice and/or Palliative Care services . Encouraged patient to consider [facility] (mental health provider) for long term follow up and therapy/counseling but she declined.  . Brief CBTprovided during session today.   Patient Self Care Activities:  . Attends all scheduled provider appointments . Calls provider office for new concerns or questions . Ability for insight . Motivation for treatment . Strong family or social  support  Patient Coping Strengths:  . Supportive Relationships . Hopefulness . Self Advocate . Able to Communicate Effectively  Patient Self Care Deficits:  . Lacks social connections . Unable to perform ADLs independently . Unable to perform IADLs independently  Please see past updates related to this goal by clicking on the "Past Updates" button in the selected goal     Task: Support and Maintain Acceptable Degree of Health, Comfort and Happiness   Note:   Care Management Activities:    - affirmation provided - community involvement promoted - expression of thoughts about present/future encouraged - independence in all possible areas promoted - life review by storytelling encouraged - palliative care plan developed - patient strengths promoted - psychosocial concerns monitored - self-expression encouraged - sleep diary encouraged - sleep hygiene techniques encouraged - social relationships promoted - strategies to maintain hearing and/or vision promoted - strategies to maintain intimacy promoted - wellness behaviors promoted    Notes:       Follow Up Plan: SW will follow up with patient by phone over the next quarter      Eula Fried, BSW, MSW, [email or web address] Phone: [phone]

## 2020-06-30 ENCOUNTER — Other Ambulatory Visit: Payer: Self-pay

## 2020-06-30 ENCOUNTER — Encounter
Admission: RE | Admit: 2020-06-30 | Discharge: 2020-06-30 | Disposition: A | Discharge status: Home / Self Care | Payer: Medicare Other | Source: Ambulatory Visit | Attending: Gastroenterology | Admitting: Gastroenterology

## 2020-06-30 DIAGNOSIS — R110 Nausea: Secondary | ICD-10-CM | POA: Insufficient documentation

## 2020-06-30 MED ORDER — TECHNETIUM TC 99M SULFUR COLLOID
2.0000 | Freq: Once | INTRAVENOUS | Status: AC | PRN
  Administered 2020-06-30: 1.436 via ORAL

## 2020-07-24 ENCOUNTER — Telehealth: Payer: Self-pay

## 2020-07-24 NOTE — Chronic Care Management (AMB) (Signed)
  Care Management   Note  07/24/2020 Name: Sydney Evans MRN: [id number] DOB: Feb 02, 1951  Sydney Evans is a 70 y.o. year old female who is a primary care patient of Jon Billings, NP and is actively engaged with the care management team. I reached out to [name] by phone today to assist with re-scheduling a follow up visit with the RN Case Manager  Follow up plan: Telephone appointment with care management team member scheduled for:08/30/2020  Noreene Larsson, [place], [address], [place] [address] Direct Dial: [phone] [email or web address] Website: [email or web address]

## 2020-07-24 NOTE — Chronic Care Management (AMB) (Signed)
  Care Management   Note  07/24/2020 Name: Sydney Evans MRN: [id number] DOB: 1950/08/16  Sydney Evans is a 70 y.o. year old female who is a primary care patient of Jon Billings, NP and is actively engaged with the care management team. I reached out to [name] by phone today to assist with re-scheduling a follow up visit with the RN Case Manager  Follow up plan: Unsuccessful telephone outreach attempt made. A HIPAA compliant phone message was left for the patient providing contact information and requesting a return call.  The care management team will reach out to the patient again over the next 7 days.  If patient returns call to provider office, please advise to call [facility] [place], [place] [address], [place], [place] [address] Direct Dial: [phone] [email or web address] Website: [email or web address]

## 2020-07-26 ENCOUNTER — Other Ambulatory Visit: Payer: Self-pay | Admitting: Family Medicine

## 2020-07-26 ENCOUNTER — Telehealth: Payer: Self-pay

## 2020-08-21 ENCOUNTER — Telehealth: Payer: Self-pay

## 2020-08-30 ENCOUNTER — Telehealth: Payer: Self-pay | Admitting: General Practice

## 2020-08-30 ENCOUNTER — Telehealth: Payer: Self-pay

## 2020-08-30 NOTE — Telephone Encounter (Signed)
  Chronic Care Management   Note  08/30/2020 Name: Sydney Evans MRN: [id number] DOB: [birth year]  The patient called back and left a message with the RNCM asking the RNCM to call her niece and make and appointment with "Dr. Creig Hines". Called and spoke briefly with the patient niece Joni Reining.  She is going to call the patient and talk to the patient. Education on being available to assist as needed with scheduling but she would need to contact the provider. Will follow up with the patient at a later time.   Follow up plan: The care management team will reach out to the patient again over the next 30 days.   Noreene Larsson RN, MSN, [facility] Mobile: [phone]

## 2020-09-11 ENCOUNTER — Other Ambulatory Visit: Payer: Self-pay | Admitting: Family Medicine

## 2020-09-11 NOTE — Telephone Encounter (Signed)
Requested medications are due for refill today.  yes  Requested medications are on the active medications list.  yes  Last refill. 03/16/2020  Future visit scheduled.   yes  Notes to clinic.  Medication not delegated.

## 2020-09-11 NOTE — Telephone Encounter (Signed)
Pt is due for cpe called pt no answer left vm

## 2020-09-13 ENCOUNTER — Ambulatory Visit: Payer: Self-pay | Admitting: *Deleted

## 2020-09-13 NOTE — Telephone Encounter (Signed)
Called pt to see about coming in earlier she will speak to her ride and see if she can come in earlier. Pt is having more issues than below

## 2020-09-13 NOTE — Telephone Encounter (Signed)
Patient should be seen for an appointment

## 2020-09-13 NOTE — Telephone Encounter (Signed)
  Patient c/o elevated B/P . Patient reports she is keeping a journal and noted B/P is elevated. At beginning of call B/P 140/80 P- 67 after 5 mintues B/P 134/77. Patient denies headaches, dizziness, chest pain , difficulty breathing or weakness on either side. Denies blurred vision, difficulty talking. Patient c/o abdominal issues and poor appetite and sometimes extreme pain. Having good days or bad days when she can eat or not. Can drink water without difficulty. Reports excessive burping at times and wakes her up. appt scheduled for 09/19/20. Care advise given. Patient verbalized understanding of care advise and to call back or go to [facility] or ED if symptoms worsen.   Reason for Disposition . [6] Systolic BP  >= 440 OR Diastolic >= 80 AND [3] taking BP medications  Answer Assessment - Initial Assessment Questions 1. BLOOD PRESSURE: "What is the blood pressure?" "Did you take at least two measurements 5 minutes apart?"     140/80 at beginning of call and then B/P 134/77 2. ONSET: "When did you take your blood pressure?"     2 weeks ago  3. HOW: "How did you obtain the blood pressure?" (e.g., visiting nurse, automatic home BP monitor)     Automatic B/P monitor 4. HISTORY: "Do you have a history of high blood pressure?"     Yes  5. MEDICATIONS: "Are you taking any medications for blood pressure?" "Have you missed any doses recently?"     No  6. OTHER SYMPTOMS: "Do you have any symptoms?" (e.g., headache, chest pain, blurred vision, difficulty breathing, weakness)     Fatigue  7. PREGNANCY: "Is there any chance you are pregnant?" "When was your last menstrual period?"     na  Protocols used: BLOOD PRESSURE - HIGH-A-AH

## 2020-09-14 NOTE — Telephone Encounter (Signed)
Office visit scheduled 5/16

## 2020-09-15 ENCOUNTER — Telehealth: Payer: Self-pay

## 2020-09-18 ENCOUNTER — Encounter: Payer: Self-pay | Admitting: Nurse Practitioner

## 2020-09-18 ENCOUNTER — Ambulatory Visit (INDEPENDENT_AMBULATORY_CARE_PROVIDER_SITE_OTHER): Payer: Medicare Other | Admitting: Nurse Practitioner

## 2020-09-18 ENCOUNTER — Other Ambulatory Visit: Payer: Self-pay

## 2020-09-18 VITALS — BP 165/70 | HR 52 | Temp 98.3°F | Ht 61.02 in | Wt 147.8 lb

## 2020-09-18 DIAGNOSIS — W19XXXA Unspecified fall, initial encounter: Secondary | ICD-10-CM | POA: Diagnosis not present

## 2020-09-18 DIAGNOSIS — I10 Essential (primary) hypertension: Secondary | ICD-10-CM | POA: Diagnosis not present

## 2020-09-18 DIAGNOSIS — K219 Gastro-esophageal reflux disease without esophagitis: Secondary | ICD-10-CM | POA: Diagnosis not present

## 2020-09-18 DIAGNOSIS — F319 Bipolar disorder, unspecified: Secondary | ICD-10-CM

## 2020-09-18 MED ORDER — CITALOPRAM HYDROBROMIDE 40 MG PO TABS: 60.0000 mg | ORAL_TABLET | Freq: Every day | ORAL | 1 refills | Status: DC

## 2020-09-18 NOTE — Assessment & Plan Note (Addendum)
Not well controlled.  Feeling very anxious without a service dog.  Will write letter for patient to receive help from her insurance company to get a service dog. Increased Celexa to 60mg  daily. Follow up in 1 month for reevaluation.

## 2020-09-18 NOTE — Progress Notes (Signed)
BP (!) 165/70   Pulse (!) 52   Temp 98.3 F (36.8 C) (Oral)   Ht 5' 1.02" (1.55 m)   Wt 147 lb 12.8 oz (67 kg)   SpO2 97%   BMI 27.90 kg/m    Subjective:    Patient ID: Sydney Evans, female    DOB: May 30, 1950, 70 y.o.   MRN: [id number]  HPI: Sydney Evans is a 70 y.o. female  Chief Complaint  Patient presents with  . Stomach issues    Patient states that she is here for her stomach pain, When her stomach hurts she does not eat and eat a lot when her stomach does not hurt. When her stomach hurts her BP runs in the mid 200's or mid 100's.   . Anxiety  . Fatigue    Has taking OTC sleep aids, has tried relaxation music, lavender infused products.   . Dizziness  . Fall    Fell yesterday and hit a tree on right side. States that is hurts when she breaths in.   HYPERTENSION Patient checks blood pressures at home and when she isn't in pain they run about 130s/70s.   Denies HA, CP, SOB, palpitations, visual changes, and lower extremity swelling.  DIZZINESS Patient has vertigo that has been worse lately due to her anxiety being elevated due to the abdominal pain.   BIPOLAR DEPRESSION/ANXIETY Patient feels like her anxiety has been very elevated due to her service dog passing away and her abdominal pain.  Patient feels like she doesn't know when she will be able to eat or if her abdominal will begin.  Patient states she doesn't feel safe because she doesn't have the service dog to assist her. Denies thoughts of suicide.   ABDOMINAL PAIN Patient has been seeing GI and was diagnosed with H. Pylori.  She completed the initial treatment then had another round but the symptoms returned.   GERD control status: worse Satisfied with current treatment? no Heartburn frequency: Patient states it is sharp pains that have her doubled over where does not want to move.  Patient states the Omeprazole and Phenergan has not helped her.  Patient has not seen the GI doctor in a couple of months.  Patient states she is very thirsty. She is able to tolerate water without a problem.   FALL Patient states she fell yesterday and hit her head on the tree. Since then she has had a bump on her and feels very light headed. Patient states she feels like things are moving when she turns her head.    Relevant past medical, surgical, family and social history reviewed and updated as indicated. Interim medical history since our last visit reviewed. Allergies and medications reviewed and updated.  Review of Systems  Eyes: Negative for visual disturbance.  Respiratory: Negative for cough, chest tightness and shortness of breath.   Cardiovascular: Negative for chest pain, palpitations and leg swelling.  Gastrointestinal: Positive for abdominal pain (sharp pains), nausea and vomiting.  Neurological: Positive for headaches. Negative for dizziness.  Psychiatric/Behavioral: Positive for sleep disturbance. Negative for suicidal ideas. The patient is nervous/anxious.     Per HPI unless specifically indicated above     Objective:    BP (!) 165/70   Pulse (!) 52   Temp 98.3 F (36.8 C) (Oral)   Ht 5' 1.02" (1.55 m)   Wt 147 lb 12.8 oz (67 kg)   SpO2 97%   BMI 27.90 kg/m   Wt Readings from Last 3 Encounters:  09/18/20 147 lb 12.8 oz (67 kg)  05/12/20 142 lb (64.4 kg)  03/17/20 143 lb 3.2 oz (65 kg)    Physical Exam Vitals and nursing note reviewed.  Constitutional:      General: She is not in acute distress.    Appearance: Normal appearance. She is normal weight. She is not ill-appearing, toxic-appearing or diaphoretic.  HENT:     Head: Normocephalic.     Right Ear: External ear normal.     Left Ear: External ear normal.     Nose: Nose normal.     Mouth/Throat:     Mouth: Mucous membranes are moist.     Pharynx: Oropharynx is clear.  Eyes:     General:        Right eye: No discharge.        Left eye: No discharge.     Extraocular Movements: Extraocular movements intact.      Conjunctiva/sclera: Conjunctivae normal.     Pupils: Pupils are equal, round, and reactive to light.  Cardiovascular:     Rate and Rhythm: Normal rate and regular rhythm.     Heart sounds: No murmur heard.   Pulmonary:     Effort: Pulmonary effort is normal. No respiratory distress.     Breath sounds: Normal breath sounds. No wheezing or rales.  Musculoskeletal:     Cervical back: Normal range of motion and neck supple.  Skin:    General: Skin is warm and dry.     Capillary Refill: Capillary refill takes less than 2 seconds.  Neurological:     General: No focal deficit present.     Mental Status: She is alert and oriented to person, place, and time. Mental status is at baseline.     Cranial Nerves: No cranial nerve deficit.     Motor: No weakness.     Gait: Gait normal.  Psychiatric:        Mood and Affect: Mood normal.        Behavior: Behavior normal.        Thought Content: Thought content normal.        Judgment: Judgment normal.     Results for orders placed or performed in visit on 03/17/20  CBC with Differential/Platelet  Result Value Ref Range   WBC 4.9 3.4 - 10.8 x10E3/uL   RBC 4.15 3.77 - 5.28 x10E6/uL   Hemoglobin 9.1 (L) 11.1 - 15.9 g/dL   Hematocrit 30.8 (L) 34.0 - 46.6 %   MCV 74 (L) 79 - 97 fL   MCH 21.9 (L) 26.6 - 33.0 pg   MCHC 29.5 (L) 31.5 - 35.7 g/dL   RDW 17.9 (H) 11.7 - 15.4 %   Platelets 396 150 - 450 x10E3/uL   Neutrophils 59 Not Estab. %   Lymphs 31 Not Estab. %   Monocytes 7 Not Estab. %   Eos 1 Not Estab. %   Basos 2 Not Estab. %   Neutrophils Absolute 2.9 1.4 - 7.0 x10E3/uL   Lymphocytes Absolute 1.5 0.7 - 3.1 x10E3/uL   Monocytes Absolute 0.4 0.1 - 0.9 x10E3/uL   EOS (ABSOLUTE) 0.1 0.0 - 0.4 x10E3/uL   Basophils Absolute 0.1 0.0 - 0.2 x10E3/uL   Immature Granulocytes 0 Not Estab. %   Immature Grans (Abs) 0.0 0.0 - 0.1 x10E3/uL  Comprehensive metabolic panel  Result Value Ref Range   Glucose 81 65 - 99 mg/dL   BUN 9 8 - 27 mg/dL    Creatinine, Ser 0.82 0.57 -  1.00 mg/dL   GFR calc non Af Amer 74 >59 mL/min/1.73   GFR calc Af Amer 86 >59 mL/min/1.73   BUN/Creatinine Ratio 11 (L) 12 - 28   Sodium 139 134 - 144 mmol/L   Potassium 3.4 (L) 3.5 - 5.2 mmol/L   Chloride 104 96 - 106 mmol/L   CO2 21 20 - 29 mmol/L   Calcium 9.4 8.7 - 10.3 mg/dL   Total Protein 6.2 6.0 - 8.5 g/dL   Albumin 4.0 3.8 - 4.8 g/dL   Globulin, Total 2.2 1.5 - 4.5 g/dL   Albumin/Globulin Ratio 1.8 1.2 - 2.2   Bilirubin Total 0.3 0.0 - 1.2 mg/dL   Alkaline Phosphatase 96 44 - 121 IU/L   AST 16 0 - 40 IU/L   ALT 9 0 - 32 IU/L  Lipid Panel w/o Chol/HDL Ratio  Result Value Ref Range   Cholesterol, Total 221 (H) 100 - 199 mg/dL   Triglycerides 69 0 - 149 mg/dL   HDL 59 >39 mg/dL   VLDL Cholesterol Cal 12 5 - 40 mg/dL   LDL Chol Calc (NIH) 150 (H) 0 - 99 mg/dL  TSH  Result Value Ref Range   TSH 3.920 0.450 - 4.500 uIU/mL  VITAMIN D 25 Hydroxy (Vit-D Deficiency, Fractures)  Result Value Ref Range   Vit D, 25-Hydroxy 32.7 30.0 - 100.0 ng/mL  Iron and TIBC  Result Value Ref Range   Total Iron Binding Capacity 411 250 - 450 ug/dL   UIBC 392 (H) 118 - 369 ug/dL   Iron 19 (L) 27 - 139 ug/dL   Iron Saturation 5 (LL) 15 - 55 %  Ferritin  Result Value Ref Range   Ferritin 8 (L) 15 - 150 ng/mL  Lamotrigine level  Result Value Ref Range   Lamotrigine Lvl 8.5 2.0 - 20.0 ug/mL      Assessment & Plan:   Problem List Items Addressed This Visit      Cardiovascular and Mediastinum   Essential hypertension    Under good control off her medicine. Will keep her off her medicine. Call if BP is greater than 140/90. Continue to monitor.         Digestive   GERD (gastroesophageal reflux disease)    Not under good control. Continue to follow with GI. OK to take her omeprazole BID occasionally- discussed the risks to absorption. Recommend patient see GI doctor again to follow up on symptoms. Follow up in 1 month.        Other   Bipolar depression  ([place]) - Primary    Not well controlled.  Feeling very anxious without a service dog.  Will write letter for patient to receive help from her insurance company to get a service dog. Increased Celexa to 60mg  daily. Follow up in 1 month for reevaluation.       Fall       Follow up plan: Return in about 1 month (around 10/19/2020) for Depression/Anxiety FU, abdominal pain.

## 2020-09-18 NOTE — Progress Notes (Deleted)
There were no vitals taken for this visit.   Subjective:    Patient ID: Sydney Evans, female    DOB: 1951-01-24, 70 y.o.   MRN: [id number]  HPI: Sydney Evans is a 70 y.o. female  No chief complaint on file.  HYPERTENSION Hypertension status: {Blank single:19197::"controlled","uncontrolled","better","worse","exacerbated","stable"}  Satisfied with current treatment? {Blank single:19197::"yes","no"} Duration of hypertension: {Blank single:19197::"chronic","months","years"} BP monitoring frequency:  {Blank single:19197::"not checking","rarely","daily","weekly","monthly","a few times a day","a few times a week","a few times a month"} BP range:  BP medication side effects:  {Blank single:19197::"yes","no"} Medication compliance: {Blank single:19197::"excellent compliance","good compliance","fair compliance","poor compliance"} Previous BP meds:{Blank [id number]::"IEPP","IRJJOACZYS","AYTKZSWFUX/NATFTDDUKG","URKYHCWC","BJSEGBTDVV","OHYWVPXTGG/YIRS","WNIOEVOJJK (bystolic)","carvedilol","chlorthalidone","clonidine","diltiazem","exforge HCT","HCTZ","irbesartan (avapro)","labetalol","lisinopril","lisinopril-HCTZ","losartan (cozaar)","methyldopa","nifedipine","olmesartan (benicar)","olmesartan-HCTZ","quinapril","ramipril","spironalactone","tekturna","valsartan","valsartan-HCTZ","verapamil"} Aspirin: {Blank single:19197::"yes","no"} Recurrent headaches: {Blank single:19197::"yes","no"} Visual changes: {Blank single:19197::"yes","no"} Palpitations: {Blank single:19197::"yes","no"} Dyspnea: {Blank single:19197::"yes","no"} Chest pain: {Blank single:19197::"yes","no"} Lower extremity edema: {Blank single:19197::"yes","no"} Dizzy/lightheaded: {Blank single:19197::"yes","no"}  ANEMIA Anemia status: {Blank single:19197::"controlled","uncontrolled","better","worse","exacerbated","stable"} Etiology of anemia: Duration of anemia treatment:  Compliance with treatment: {Blank  single:19197::"excellent compliance","good compliance","fair compliance","poor compliance"} Iron supplementation side effects: {Blank single:19197::"yes","no"} Severity of anemia: {Blank single:19197::"mild","moderate","severe"} Fatigue: {Blank single:19197::"yes","no"} Decreased exercise tolerance: {Blank single:19197::"yes","no"}  Dyspnea on exertion: {Blank single:19197::"yes","no"} Palpitations: {Blank single:19197::"yes","no"} Bleeding: {Blank single:19197::"yes","no"} Pica: {Blank single:19197::"yes","no"}  BIPOLAR DEPRESSION  GERD GERD control status: {Blank single:19197::"controlled","uncontrolled","better","worse","exacerbated","stable"}Satisfied with current treatment? {Blank single:19197::"yes","no"} Heartburn frequency:  Medication side effects: {Blank single:19197::"yes","no"}  Medication compliance: {Blank multiple:19196::"better","worse","stable","fluctuating"} Previous GERD medications: Antacid use frequency:   Duration:  Nature:  Location:  Heartburn duration:  Alleviatiating factors:   Aggravating factors:  Dysphagia: {Blank single:19197::"yes","no"} Odynophagia:  {Blank single:19197::"yes","no"} Hematemesis: {Blank single:19197::"yes","no"} Blood in stool: {Blank single:19197::"yes","no"} EGD: {Blank single:19197::"yes","no"}    Relevant past medical, surgical, family and social history reviewed and updated as indicated. Interim medical history since our last visit reviewed. Allergies and medications reviewed and updated.  Review of Systems  Per HPI unless specifically indicated above     Objective:    There were no vitals taken for this visit.  Wt Readings from Last 3 Encounters:  05/12/20 142 lb (64.4 kg)  03/17/20 143 lb 3.2 oz (65 kg)  01/03/20 145 lb (65.8 kg)    Physical Exam  Results for orders placed or performed in visit on 03/17/20  CBC with Differential/Platelet  Result Value Ref Range   WBC 4.9 3.4 - 10.8 x10E3/uL   RBC 4.15 3.77 -  5.28 x10E6/uL   Hemoglobin 9.1 (L) 11.1 - 15.9 g/dL   Hematocrit 30.8 (L) 34.0 - 46.6 %   MCV 74 (L) 79 - 97 fL   MCH 21.9 (L) 26.6 - 33.0 pg   MCHC 29.5 (L) 31.5 - 35.7 g/dL   RDW 17.9 (H) 11.7 - 15.4 %   Platelets 396 150 - 450 x10E3/uL   Neutrophils 59 Not Estab. %   Lymphs 31 Not Estab. %   Monocytes 7 Not Estab. %   Eos 1 Not Estab. %   Basos 2 Not Estab. %   Neutrophils Absolute 2.9 1.4 - 7.0 x10E3/uL   Lymphocytes Absolute 1.5 0.7 - 3.1 x10E3/uL   Monocytes Absolute 0.4 0.1 - 0.9 x10E3/uL   EOS (ABSOLUTE) 0.1 0.0 - 0.4 x10E3/uL   Basophils Absolute 0.1 0.0 - 0.2 x10E3/uL   Immature Granulocytes 0 Not Estab. %   Immature Grans (Abs) 0.0 0.0 - 0.1 x10E3/uL  Comprehensive metabolic panel  Result Value Ref Range   Glucose 81 65 - 99 mg/dL   BUN 9 8 - 27 mg/dL   Creatinine, Ser 0.82 0.57 - 1.00 mg/dL   GFR calc non Af Amer 74 >59 mL/min/1.73   GFR  calc Af Amer 86 >59 mL/min/1.73   BUN/Creatinine Ratio 11 (L) 12 - 28   Sodium 139 134 - 144 mmol/L   Potassium 3.4 (L) 3.5 - 5.2 mmol/L   Chloride 104 96 - 106 mmol/L   CO2 21 20 - 29 mmol/L   Calcium 9.4 8.7 - 10.3 mg/dL   Total Protein 6.2 6.0 - 8.5 g/dL   Albumin 4.0 3.8 - 4.8 g/dL   Globulin, Total 2.2 1.5 - 4.5 g/dL   Albumin/Globulin Ratio 1.8 1.2 - 2.2   Bilirubin Total 0.3 0.0 - 1.2 mg/dL   Alkaline Phosphatase 96 44 - 121 IU/L   AST 16 0 - 40 IU/L   ALT 9 0 - 32 IU/L  Lipid Panel w/o Chol/HDL Ratio  Result Value Ref Range   Cholesterol, Total 221 (H) 100 - 199 mg/dL   Triglycerides 69 0 - 149 mg/dL   HDL 59 >39 mg/dL   VLDL Cholesterol Cal 12 5 - 40 mg/dL   LDL Chol Calc (NIH) 150 (H) 0 - 99 mg/dL  TSH  Result Value Ref Range   TSH 3.920 0.450 - 4.500 uIU/mL  VITAMIN D 25 Hydroxy (Vit-D Deficiency, Fractures)  Result Value Ref Range   Vit D, 25-Hydroxy 32.7 30.0 - 100.0 ng/mL  Iron and TIBC  Result Value Ref Range   Total Iron Binding Capacity 411 250 - 450 ug/dL   UIBC 392 (H) 118 - 369 ug/dL   Iron 19  (L) 27 - 139 ug/dL   Iron Saturation 5 (LL) 15 - 55 %  Ferritin  Result Value Ref Range   Ferritin 8 (L) 15 - 150 ng/mL  Lamotrigine level  Result Value Ref Range   Lamotrigine Lvl 8.5 2.0 - 20.0 ug/mL      Assessment & Plan:   Problem List Items Addressed This Visit      Cardiovascular and Mediastinum   Essential hypertension - Primary    Under good control off her medicine. Will keep her off her medicine. Call if BP is greater than 140/90. Continue to monitor.         Digestive   GERD (gastroesophageal reflux disease)    Not under good control. Continue to follow with GI. OK to take her omeprazole BID occasionally- discussed the risks to absorption. Call with any concerns.         Other   Hyperlipidemia    Rechecking labs today. Await results. Treat as needed.       Iron deficiency anemia   Bipolar depression ([place])    Under good control on current regimen. Continue current regimen. Continue to monitor. Call with any concerns. Refills given.            Follow up plan: No follow-ups on file.

## 2020-09-18 NOTE — Assessment & Plan Note (Signed)
Rechecking labs today. Await results. Treat as needed.  °

## 2020-09-18 NOTE — Assessment & Plan Note (Addendum)
Not under good control. Continue to follow with GI. OK to take her omeprazole BID occasionally- discussed the risks to absorption. Recommend patient see GI doctor again to follow up on symptoms. Follow up in 1 month.

## 2020-09-18 NOTE — Progress Notes (Deleted)
There were no vitals taken for this visit.   Subjective:    Patient ID: Sydney Evans, female    DOB: [birth year], 70 y.o.   MRN: [id number]  HPI: Sydney Evans is a 70 y.o. female presenting on 09/18/2020 for comprehensive medical examination. Current medical complaints include:{Blank single:19197::"none","***"}  She currently lives with: Menopausal Symptoms: {Blank single:19197::"yes","no"} HYPERTENSION Hypertension status: {Blank single:19197::"controlled","uncontrolled","better","worse","exacerbated","stable"}  Satisfied with current treatment? {Blank single:19197::"yes","no"} Duration of hypertension: {Blank single:19197::"chronic","months","years"} BP monitoring frequency:  {Blank single:19197::"not checking","rarely","daily","weekly","monthly","a few times a day","a few times a week","a few times a month"} BP range:  BP medication side effects:  {Blank single:19197::"yes","no"} Medication compliance: {Blank single:19197::"excellent compliance","good compliance","fair compliance","poor compliance"} Previous BP meds:{Blank QMVHQION:62952::"WUXL","KGMWNUUVOZ","DGUYQIHKVQ/QVZDGLOVFI","EPPIRJJO","ACZYSAYTKZ","SWFUXNATFT/DDUK","GURKYHCWCB (bystolic)","carvedilol","chlorthalidone","clonidine","diltiazem","exforge HCT","HCTZ","irbesartan (avapro)","labetalol","lisinopril","lisinopril-HCTZ","losartan (cozaar)","methyldopa","nifedipine","olmesartan (benicar)","olmesartan-HCTZ","quinapril","ramipril","spironalactone","tekturna","valsartan","valsartan-HCTZ","verapamil"} Aspirin: {Blank single:19197::"yes","no"} Recurrent headaches: {Blank single:19197::"yes","no"} Visual changes: {Blank single:19197::"yes","no"} Palpitations: {Blank single:19197::"yes","no"} Dyspnea: {Blank single:19197::"yes","no"} Chest pain: {Blank single:19197::"yes","no"} Lower extremity edema: {Blank single:19197::"yes","no"} Dizzy/lightheaded: {Blank single:19197::"yes","no"}  ANEMIA Anemia status: {Blank  single:19197::"controlled","uncontrolled","better","worse","exacerbated","stable"} Etiology of anemia: Duration of anemia treatment:  Compliance with treatment: {Blank single:19197::"excellent compliance","good compliance","fair compliance","poor compliance"} Iron supplementation side effects: {Blank single:19197::"yes","no"} Severity of anemia: {Blank single:19197::"mild","moderate","severe"} Fatigue: {Blank single:19197::"yes","no"} Decreased exercise tolerance: {Blank single:19197::"yes","no"}  Dyspnea on exertion: {Blank single:19197::"yes","no"} Palpitations: {Blank single:19197::"yes","no"} Bleeding: {Blank single:19197::"yes","no"} Pica: {Blank single:19197::"yes","no"}  BIPOLAR DEPRESSION  GERD GERD control status: {Blank single:19197::"controlled","uncontrolled","better","worse","exacerbated","stable"}Satisfied with current treatment? {Blank single:19197::"yes","no"} Heartburn frequency:  Medication side effects: {Blank single:19197::"yes","no"}  Medication compliance: {Blank multiple:19196::"better","worse","stable","fluctuating"} Previous GERD medications: Antacid use frequency:   Duration:  Nature:  Location:  Heartburn duration:  Alleviatiating factors:   Aggravating factors:  Dysphagia: {Blank single:19197::"yes","no"} Odynophagia:  {Blank single:19197::"yes","no"} Hematemesis: {Blank single:19197::"yes","no"} Blood in stool: {Blank single:19197::"yes","no"} EGD: {Blank single:19197::"yes","no"} Depression Screen done today and results listed below:  Depression screen [facility] 2/9 03/17/2020 01/03/2020 11/11/2019  Decreased Interest 0 2 0  Down, Depressed, Hopeless 0 2 0  PHQ - 2 Score 0 4 0  Altered sleeping 3 3 0  Tired, decreased energy 3 3 1   Change in appetite 3 2 1   Feeling bad or failure about yourself  0 0 0  Trouble concentrating 0 2 0  Moving slowly or fidgety/restless 0 0 0  Suicidal thoughts 0 0 0  PHQ-9 Score 9 14 2   Difficult doing work/chores -  Somewhat difficult -    The patient {has/does not JSEG:31517} a history of falls. I {did/did not:19850} complete a risk assessment for falls. A plan of care for falls {was/was not:19852} documented.   Past Medical History:  Past Medical History:  Diagnosis Date  . Anemia   . Anxiety   . Arthritis   . Barrett's esophagus   . Bipolar disorder ([place])   . 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 Blood clot in vein   . Cancer ([place])    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  . Carotid arterial disease ([place])   . Contracture of joint of upper arm   . Depression   . Elevated lipids   . GERD (gastroesophageal reflux disease)   . Hypertension   . Migraine   . Osteoporosis   . Poor balance   . Sinus congestion   . Stroke ([place])    x 2  . TIA (transient ischemic attack)   . Vertigo     Surgical History:  Past Surgical History:  Procedure Laterality Date  . ABDOMINAL HYSTERECTOMY    . BACK SURGERY    . BREAST BIOPSY Left   . CARPAL TUNNEL RELEASE Bilateral   . CATARACT EXTRACTION W/ INTRAOCULAR LENS  IMPLANT, BILATERAL Bilateral   .  CHOLECYSTECTOMY    . COLONOSCOPY WITH PROPOFOL N/A 04/09/2018   Procedure: COLONOSCOPY WITH PROPOFOL;  Surgeon: Jonathon Bellows, MD;  Location: [facility] ENDOSCOPY;  Service: Gastroenterology;  Laterality: N/A;  . crystal cyst removed on left foot    . ESOPHAGOGASTRODUODENOSCOPY (EGD) WITH PROPOFOL N/A 04/09/2018   Procedure: ESOPHAGOGASTRODUODENOSCOPY (EGD) WITH PROPOFOL;  Surgeon: Jonathon Bellows, MD;  Location: [facility] ENDOSCOPY;  Service: Gastroenterology;  Laterality: N/A;  . ESOPHAGOGASTRODUODENOSCOPY (EGD) WITH PROPOFOL N/A 12/14/2019   Procedure: ESOPHAGOGASTRODUODENOSCOPY (EGD) WITH PROPOFOL;  Surgeon: Lesly Rubenstein, MD;  Location: [facility] ENDOSCOPY;  Service: Endoscopy;  Laterality: N/A;  . EYE SURGERY    . femoral fx    . FOOT SURGERY    . GIVENS CAPSULE STUDY N/A 06/16/2018   Procedure: GIVENS CAPSULE STUDY;  Surgeon: Jonathon Bellows, MD;  Location: [facility] ENDOSCOPY;  Service:  Gastroenterology;  Laterality: N/A;  . JOINT REPLACEMENT    . KNEE SURGERY    . ORIF PERIPROSTHETIC FRACTURE Right 10/07/2019   Procedure: OPEN REDUCTION INTERNAL FIXATION (ORIF) PERIPROSTHETIC FRACTURE;  Surgeon: Corky Mull, MD;  Location: [facility] ORS;  Service: Orthopedics;  Laterality: Right;  . TONSILLECTOMY    . TOTAL KNEE ARTHROPLASTY Bilateral   . TOTAL SHOULDER ARTHROPLASTY Left 08/01/2016   Procedure: TOTAL SHOULDER ARTHROPLASTY;  Surgeon: Corky Mull, MD;  Location: [facility] ORS;  Service: Orthopedics;  Laterality: Left;  . TOTAL SHOULDER ARTHROPLASTY Right 09/30/2019   Procedure: TOTAL SHOULDER ARTHROPLASTY;  Surgeon: Corky Mull, MD;  Location: [facility] ORS;  Service: Orthopedics;  Laterality: Right;    Medications:  Current Outpatient Medications on File Prior to Visit  Medication Sig  . Carboxymethylcellulose Sodium (LUBRICANT EYE DROPS OP) Place 1 drop into both eyes daily as needed (Dry eye). Systane  . citalopram (CELEXA) 40 MG tablet Take 1 tablet (40 mg total) by mouth daily.  . diclofenac Sodium (VOLTAREN) 1 % GEL Apply 4 g topically 4 (four) times daily.  . diphenhydrAMINE (BENADRYL) 25 MG tablet Take 25 mg by mouth every 6 (six) hours as needed for itching.  . fluticasone (FLONASE) 50 MCG/ACT nasal spray Place 2 sprays into both nostrils daily as needed for allergies or rhinitis.  [name] lamoTRIgine (LAMICTAL) 200 MG tablet Take 1 tablet (200 mg total) by mouth daily.  [name] omeprazole (PRILOSEC) 40 MG capsule Take 1-2 capsules (40-80 mg total) by mouth daily.  . ondansetron (ZOFRAN ODT) 4 MG disintegrating tablet Take 1 tablet (4 mg total) by mouth every 8 (eight) hours as needed for nausea or vomiting. (Patient not taking: Reported on 12/15/2019)  . ondansetron (ZOFRAN-ODT) 4 MG disintegrating tablet Take 1 tablet (4 mg total) by mouth every 8 (eight) hours as needed for nausea or vomiting. 3 times daily as needed (Patient not taking: Reported on 03/17/2020)  . polyethylene glycol  powder (GLYCOLAX/MIRALAX) 17 GM/SCOOP powder Take 17 g by mouth 2 (two) times daily as needed.  . promethazine (PHENERGAN) 12.5 MG tablet Take 1 tablet (12.5 mg total) by mouth every 8 (eight) hours as needed for nausea or vomiting.  . Travoprost, BAK Free, (TRAVATAN) 0.004 % SOLN ophthalmic solution Place 1 drop into both eyes at bedtime.   No current facility-administered medications on file prior to visit.    Allergies:  Allergies  Allergen Reactions  . Hydroxyzine Hives and Rash  . Gabapentin     Dizzy and confusion  . Dilaudid [Hydromorphone] Itching  . Hydrocodone Hives and Rash    "terrible scratching"  Make take with benadryl  . Ketorolac  Rash     May take with benadryl  . Percocet [Oxycodone-Acetaminophen] Itching and Rash    May take with benadryl    . Toradol [Ketorolac Tromethamine] Rash    May take with benadryl    Social History:  Social History   Socioeconomic History  . Marital status: Single    Spouse name: Not on file  . Number of children: 3  . Years of education: Not on file  . Highest education level: Some college, no degree  Occupational History  . Not on file  Tobacco Use  . Smoking status: Current Every Day Smoker    Packs/day: 0.50    Types: Cigarettes    Last attempt to quit: 08/04/2016    Years since quitting: 4.1  . Smokeless tobacco: Never Used  Vaping Use  . Vaping Use: Never used  Substance and Sexual Activity  . Alcohol use: No  . Drug use: No  . Sexual activity: Not on file  Other Topics Concern  . Not on file  Social History Narrative   Lives at home alone in an apt   Right handed   Disabled since 1998   Caffeine: about 30 oz daily   Social Determinants of Health   Financial Resource Strain: Low Risk   . Difficulty of Paying Living Expenses: Not hard at all  Food Insecurity: No Food Insecurity  . Worried About Charity fundraiser in the Last Year: Never true  . Ran Out of Food in the Last Year: Never true  Transportation  Needs: No Transportation Needs  . Lack of Transportation (Medical): No  . Lack of Transportation (Non-Medical): No  Physical Activity: Inactive  . Days of Exercise per Week: 0 days  . Minutes of Exercise per Session: 0 min  Stress: Stress Concern Present  . Feeling of Stress : Very much  Social Connections: Unknown  . Frequency of Communication with Friends and Family: More than three times a week  . Frequency of Social Gatherings with Friends and Family: More than three times a week  . Attends Religious Services: Not on file  . Active Member of Clubs or Organizations: Not on file  . Attends Archivist Meetings: Not on file  . Marital Status: Not on file  Intimate Partner Violence: Not At Risk  . Fear of Current or Ex-Partner: No  . Emotionally Abused: No  . Physically Abused: No  . Sexually Abused: No   Social History   Tobacco Use  Smoking Status Current Every Day Smoker  . Packs/day: 0.50  . Types: Cigarettes  . Last attempt to quit: 08/04/2016  . Years since quitting: 4.1  Smokeless Tobacco Never Used   Social History   Substance and Sexual Activity  Alcohol Use No    Family History:  Family History  Problem Relation Age of Onset  . Other Mother        ?lupus   . Cancer Mother   . High Cholesterol Mother   . CAD Father        CABG  . High Cholesterol Father   . Arthritis Father   . Cancer Sister   . Heart murmur Sister   . Heart murmur Brother   . High blood pressure Other        "for everybody"  . High Cholesterol Other        "for everybody"    Past medical history, surgical history, medications, allergies, family history and social history reviewed with patient today  and changes made to appropriate areas of the chart.   ROS All other ROS negative except what is listed above and in the HPI.      Objective:    There were no vitals taken for this visit.  Wt Readings from Last 3 Encounters:  05/12/20 142 lb (64.4 kg)  03/17/20 143 lb 3.2  oz (65 kg)  01/03/20 145 lb (65.8 kg)    Physical Exam  Results for orders placed or performed in visit on 03/17/20  CBC with Differential/Platelet  Result Value Ref Range   WBC 4.9 3.4 - 10.8 x10E3/uL   RBC 4.15 3.77 - 5.28 x10E6/uL   Hemoglobin 9.1 (L) 11.1 - 15.9 g/dL   Hematocrit 30.8 (L) 34.0 - 46.6 %   MCV 74 (L) 79 - 97 fL   MCH 21.9 (L) 26.6 - 33.0 pg   MCHC 29.5 (L) 31.5 - 35.7 g/dL   RDW 17.9 (H) 11.7 - 15.4 %   Platelets 396 150 - 450 x10E3/uL   Neutrophils 59 Not Estab. %   Lymphs 31 Not Estab. %   Monocytes 7 Not Estab. %   Eos 1 Not Estab. %   Basos 2 Not Estab. %   Neutrophils Absolute 2.9 1.4 - 7.0 x10E3/uL   Lymphocytes Absolute 1.5 0.7 - 3.1 x10E3/uL   Monocytes Absolute 0.4 0.1 - 0.9 x10E3/uL   EOS (ABSOLUTE) 0.1 0.0 - 0.4 x10E3/uL   Basophils Absolute 0.1 0.0 - 0.2 x10E3/uL   Immature Granulocytes 0 Not Estab. %   Immature Grans (Abs) 0.0 0.0 - 0.1 x10E3/uL  Comprehensive metabolic panel  Result Value Ref Range   Glucose 81 65 - 99 mg/dL   BUN 9 8 - 27 mg/dL   Creatinine, Ser 0.82 0.57 - 1.00 mg/dL   GFR calc non Af Amer 74 >59 mL/min/1.73   GFR calc Af Amer 86 >59 mL/min/1.73   BUN/Creatinine Ratio 11 (L) 12 - 28   Sodium 139 134 - 144 mmol/L   Potassium 3.4 (L) 3.5 - 5.2 mmol/L   Chloride 104 96 - 106 mmol/L   CO2 21 20 - 29 mmol/L   Calcium 9.4 8.7 - 10.3 mg/dL   Total Protein 6.2 6.0 - 8.5 g/dL   Albumin 4.0 3.8 - 4.8 g/dL   Globulin, Total 2.2 1.5 - 4.5 g/dL   Albumin/Globulin Ratio 1.8 1.2 - 2.2   Bilirubin Total 0.3 0.0 - 1.2 mg/dL   Alkaline Phosphatase 96 44 - 121 IU/L   AST 16 0 - 40 IU/L   ALT 9 0 - 32 IU/L  Lipid Panel w/o Chol/HDL Ratio  Result Value Ref Range   Cholesterol, Total 221 (H) 100 - 199 mg/dL   Triglycerides 69 0 - 149 mg/dL   HDL 59 >39 mg/dL   VLDL Cholesterol Cal 12 5 - 40 mg/dL   LDL Chol Calc (NIH) 150 (H) 0 - 99 mg/dL  TSH  Result Value Ref Range   TSH 3.920 0.450 - 4.500 uIU/mL  VITAMIN D 25 Hydroxy (Vit-D  Deficiency, Fractures)  Result Value Ref Range   Vit D, 25-Hydroxy 32.7 30.0 - 100.0 ng/mL  Iron and TIBC  Result Value Ref Range   Total Iron Binding Capacity 411 250 - 450 ug/dL   UIBC 392 (H) 118 - 369 ug/dL   Iron 19 (L) 27 - 139 ug/dL   Iron Saturation 5 (LL) 15 - 55 %  Ferritin  Result Value Ref Range   Ferritin 8 (L) 15 -  150 ng/mL  Lamotrigine level  Result Value Ref Range   Lamotrigine Lvl 8.5 2.0 - 20.0 ug/mL      Assessment & Plan:   Problem List Items Addressed This Visit      Cardiovascular and Mediastinum   Essential hypertension - Primary    Under good control off her medicine. Will keep her off her medicine. Call if BP is greater than 140/90. Continue to monitor.         Digestive   GERD (gastroesophageal reflux disease)    Not under good control. Continue to follow with GI. OK to take her omeprazole BID occasionally- discussed the risks to absorption. Call with any concerns.         Other   Hyperlipidemia    Rechecking labs today. Await results. Treat as needed.       Iron deficiency anemia    Rechecking labs today. Await results. Treat as needed.       Bipolar depression ([place])    Under good control on current regimen. Continue current regimen. Continue to monitor. Call with any concerns. Refills given.            Follow up plan: No follow-ups on file.   LABORATORY TESTING:  - Pap smear: {Blank [id number] done","not applicable","up to date","done elsewhere"}  IMMUNIZATIONS:   - Tdap: Tetanus vaccination status reviewed: {tetanus status:315746}. - Influenza: Postponed to flu season - Pneumovax: {Blank single:19197::"Up to date","Administered today","Not applicable","Refused","Given elsewhere"} - Prevnar: {Blank single:19197::"Up to date","Administered today","Not applicable","Refused","Given elsewhere"} - HPV: Not applicable - Zostavax vaccine: {Blank single:19197::"Up to date","Administered today","Not  applicable","Refused","Given elsewhere"}  SCREENING: -Mammogram: Not applicable  - Colonoscopy: Up to date  - Bone Density: {Blank single:19197::"Up to date","Ordered today","Not applicable","Refused","Done elsewhere"}  -Hearing Test: Not applicable  -Spirometry: Not applicable   PATIENT COUNSELING:   Advised to take 1 mg of folate supplement per day if capable of pregnancy.   Sexuality: Discussed sexually transmitted diseases, partner selection, use of condoms, avoidance of unintended pregnancy  and contraceptive alternatives.   Advised to avoid cigarette smoking.  I discussed with the patient that most people either abstain from alcohol or drink within safe limits (<=14/week and <=4 drinks/occasion for males, <=7/weeks and <= 3 drinks/occasion for females) and that the risk for alcohol disorders and other health effects rises proportionally with the number of drinks per week and how often a drinker exceeds daily limits.  Discussed cessation/primary prevention of drug use and availability of treatment for abuse.   Diet: Encouraged to adjust caloric intake to maintain  or achieve ideal body weight, to reduce intake of dietary saturated fat and total fat, to limit sodium intake by avoiding high sodium foods and not adding table salt, and to maintain adequate dietary potassium and calcium preferably from fresh fruits, vegetables, and low-fat dairy products.    stressed the importance of regular exercise  Injury prevention: Discussed safety belts, safety helmets, smoke detector, smoking near bedding or upholstery.   Dental health: Discussed importance of regular tooth brushing, flossing, and dental visits.    NEXT PREVENTATIVE PHYSICAL DUE IN 1 YEAR. No follow-ups on file.

## 2020-09-18 NOTE — Assessment & Plan Note (Addendum)
Under good control off her medicine. Will keep her off her medicine. Call if BP is greater than 140/90. Continue to monitor.

## 2020-09-19 ENCOUNTER — Ambulatory Visit: Payer: Medicare Other | Admitting: Nurse Practitioner

## 2020-09-27 ENCOUNTER — Ambulatory Visit (INDEPENDENT_AMBULATORY_CARE_PROVIDER_SITE_OTHER): Payer: Medicare Other | Admitting: General Practice

## 2020-09-27 ENCOUNTER — Telehealth: Payer: Self-pay | Admitting: General Practice

## 2020-09-27 DIAGNOSIS — R197 Diarrhea, unspecified: Secondary | ICD-10-CM

## 2020-09-27 DIAGNOSIS — F419 Anxiety disorder, unspecified: Secondary | ICD-10-CM

## 2020-09-27 DIAGNOSIS — I10 Essential (primary) hypertension: Secondary | ICD-10-CM

## 2020-09-27 DIAGNOSIS — W19XXXD Unspecified fall, subsequent encounter: Secondary | ICD-10-CM

## 2020-09-27 DIAGNOSIS — F319 Bipolar disorder, unspecified: Secondary | ICD-10-CM

## 2020-09-27 DIAGNOSIS — R110 Nausea: Secondary | ICD-10-CM

## 2020-09-27 NOTE — Chronic Care Management (AMB) (Signed)
Chronic Care Management   CCM RN Visit Note  09/27/2020 Name: Sydney Evans MRN: [id number] DOB: [birth year]  Subjective: Sydney Evans is a 70 y.o. year old female who is a primary care patient of Jon Billings, NP. The care management team was consulted for assistance with disease management and care coordination needs.    Engaged with patient by telephone for follow up visit in response to provider referral for case management and/or care coordination services.   Consent to Services:  The patient was given information about Chronic Care Management services, agreed to services, and gave verbal consent prior to initiation of services.  Please see initial visit note for detailed documentation.   Patient agreed to services and verbal consent obtained.   Assessment: Review of patient past medical history, allergies, medications, health status, including review of consultants reports, laboratory and other test data, was performed as part of comprehensive evaluation and provision of chronic care management services.   SDOH (Social Determinants of Health) assessments and interventions performed:    CCM Care Plan  Allergies  Allergen Reactions  . Hydroxyzine Hives and Rash  . Gabapentin     Dizzy and confusion  . Dilaudid [Hydromorphone] Itching  . Hydrocodone Hives and Rash    "terrible scratching"  Make take with benadryl  . Ketorolac Rash     May take with benadryl  . Percocet [Oxycodone-Acetaminophen] Itching and Rash    May take with benadryl    . Toradol [Ketorolac Tromethamine] Rash    May take with benadryl    Outpatient Encounter Medications as of 09/27/2020  Medication Sig  . Carboxymethylcellulose Sodium (LUBRICANT EYE DROPS OP) Place 1 drop into both eyes daily as needed (Dry eye). Systane  . citalopram (CELEXA) 40 MG tablet Take 1.5 tablets (60 mg total) by mouth daily.  . diclofenac Sodium (VOLTAREN) 1 % GEL Apply 4 g topically 4 (four) times daily.  .  diphenhydrAMINE (BENADRYL) 25 MG tablet Take 25 mg by mouth every 6 (six) hours as needed for itching.  . fluticasone (FLONASE) 50 MCG/ACT nasal spray Place 2 sprays into both nostrils daily as needed for allergies or rhinitis.  [name] ibuprofen (ADVIL) 800 MG tablet Take by mouth.  . lamoTRIgine (LAMICTAL) 200 MG tablet Take 1 tablet (200 mg total) by mouth daily.  [name] omeprazole (PRILOSEC) 40 MG capsule Take 1-2 capsules (40-80 mg total) by mouth daily.  . polyethylene glycol powder (GLYCOLAX/MIRALAX) 17 GM/SCOOP powder Take 17 g by mouth 2 (two) times daily as needed.  . promethazine (PHENERGAN) 12.5 MG tablet Take 1 tablet (12.5 mg total) by mouth every 8 (eight) hours as needed for nausea or vomiting.  . Travoprost, BAK Free, (TRAVATAN) 0.004 % SOLN ophthalmic solution Place 1 drop into both eyes at bedtime.   No facility-administered encounter medications on file as of 09/27/2020.    Patient Active Problem List   Diagnosis Date Noted  . Fall 09/18/2020  . Duodenal nodule 03/17/2020  . Bipolar depression ([place]) 11/17/2019  . Anxiety 11/17/2019  . Generalized abdominal pain   . Traumatic complete tear of right rotator cuff 10/08/2019  . Hypokalemia 10/07/2019  . Hypomagnesemia 10/07/2019  . Dislocation of right shoulder joint 10/06/2019  . NSVT (nonsustained ventricular tachycardia) ([place])   . Ileus ([place]) 10/03/2019  . 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 Post-concussion headache 01/20/2018  . Iron deficiency anemia 10/05/2017  . Carotid stenosis 11/17/2016  . GERD (gastroesophageal reflux disease) 11/17/2016  . Essential hypertension 11/17/2016  .  Hyperlipidemia 11/17/2016  . 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 Rotator cuff tendinitis, left 05/20/2016  . History of TIAs 07/17/2015  . 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 TIA (transient ischemic attack) 12/23/2014  . Localized, primary osteoarthritis of shoulder region  11/24/2014  . Disorder of bone and articular cartilage 09/29/2014    Conditions to be addressed/monitored:HTN, Anxiety, Depression, Bipolar Disorder and falls, GI symptoms, nausea, and diarrhea   Care Plan : RNCM: Hypertension (Adult)  Updates made by Vanita Ingles since 09/27/2020 12:00 AM    Problem: RNCM: Hypertension (Hypertension)   Priority: Medium    Goal: RNCM: Hypertension Monitored   Priority: Medium  Note:   Objective:  . Last practice recorded BP readings:  . BP Readings from Last 3 Encounters: .  09/26/20 . 136/70 .  09/18/20 . (!) 165/70 .  05/12/20 . (!) 166/71 .    [name] Most recent eGFR/CrCl: No results found for: EGFR  No components found for: CRCL Current Barriers:  [name] Knowledge Deficits related to basic understanding of hypertension pathophysiology and self care management . Knowledge Deficits related to understanding of medications prescribed for management of hypertension . Lacks social connections . Does not contact provider office for questions/concerns . Does not take pharmacological interventions for blood pressure management  Case Manager Clinical Goal(s):  [name] Over the next 120 days, patient will verbalize understanding of plan for hypertension management . Over the next 120 days, patient will demonstrate improved adherence to prescribed treatment plan for hypertension as evidenced by taking all medications as prescribed, monitoring and recording blood pressure as directed, adhering to low sodium/DASH diet . Over the next 120 days, patient will demonstrate improved health management independence as evidenced by checking blood pressure as directed and notifying PCP if SBP>160 or DBP > 90, taking all medications as prescribe, and adhering to a low sodium diet as discussed. Interventions:  . Collaboration with Jon Billings, NP regarding development and update of comprehensive plan of care as evidenced by provider attestation and  co-signature . Inter-disciplinary care team collaboration (see longitudinal plan of care) . Evaluation of current treatment plan related to hypertension self management and patient's adherence to plan as established by provider. 09-27-2020: The patient states that her pressure was high in the office last week due to her not feeling well and she told the MD it would be elevated. The patient states it varies at home and is dependent on how she feels on a given day. She states that she was having a really bad day that day with not feeling well and her anxiety was off the "charts".  She is feeling much better today.  . Provided education to patient re: stroke prevention, s/s of heart attack and stroke, DASH diet, complications of uncontrolled blood pressure. 09-27-2020: The patient states that she has had a stroke in the past and she is very mindful of her dietary restrictions and keeping track of her blood pressures. She takes her blood pressures several times a day. . Reviewed medications with patient and discussed importance of compliance. 09-27-2020: Does not take any medications for blood pressure control but is compliant with her medications regimen.  . Discussed plans with patient for ongoing care management follow up and provided patient with direct contact information for care management team . Advised patient, providing education and rationale, to monitor blood pressure daily and record, calling PCP for findings outside established parameters. The patient states she has been taking her blood pressure  and writing down the values but she did not have her book near at the time of the call. Will continue to monitor. 09-27-2020: The patient gave the RNCM several blood pressure readings. The readings are recorded in VS table. Range is 975-300 systolic and 70 to 85 diastolic. Denies any headaches or other concerns with blood pressures. States HTN runs in her family. Praised the patient for keeping record of her  readings.  Patient Goals/Self-Care Activities . Over the next 120 days, patient will:  - Self administers medications as prescribed Attends all scheduled provider appointments Calls provider office for new concerns, questions, or BP outside discussed parameters Checks BP and records as discussed-09-27-2020: Had her log book and provided several readings for the [facility] today.  Follows a low sodium diet/DASH diet- 09-27-2020: Stays away from fried foods.  - blood pressure trends reviewed - depression screen reviewed - home or ambulatory blood pressure monitoring encouraged  Follow Up Plan: Telephone follow up appointment with care management team member scheduled for: 12-06-2020 at 0930 am   Care Plan : RNCM: Anxiety, bipolar Depression (Adult)  Updates made by Vanita Ingles since 09/27/2020 12:00 AM    Problem: RNCM: Anxiety, and Bipolar Depression Identification (Depression)   Priority: Medium    Long-Range Goal: RNCM: Depressive Symptoms Identified   Priority: Medium  Note:   Current Barriers:  . Chronic Disease Management support and education needs related to effective management of depression and anxiety  . Unable to independently manage anxiety and depression  . Does not contact provider office for questions/concerns . In need of a service dog but can not pay the high prices associated with training or purchasing a service dog  Nurse Case Manager Clinical Goal(s):  [name] Over the next 120 days, patient will verbalize understanding of plan for effective management of depression and anxiety  . Over the next 120 days, patient will work with [place], and pcp  to address needs related to changes in mood/anxiety/depression  . Over the next 120 days, patient will demonstrate a decrease in depressive mood exacerbations as evidenced by normalized and baseline status of mood and mentation.   Interventions:  . 1:1 collaboration with Jon Billings, NP regarding development and update of  comprehensive plan of care as evidenced by provider attestation and co-signature . Inter-disciplinary care team collaboration (see longitudinal plan of care) . Evaluation of current treatment plan related to bipolar depression and anxiety  and patient's adherence to plan as established by provider. 09-27-2020: The patient states she is having a better day today and her anxiety level is not as high as it was in the office last week. She feels she would benefit from a service dog to help with her anxiety and other health problems. She had a service dog for 14 years but her dog passed away last year. She has discussed with the insurance and if the provider fills out paperwork she can get help from the insurance with the cost. It cost 13000.00 to have an animal trained and [phone] to purchase a service dog already trained.  A care guide referral has been placed for resources to help with a service dog. Also will do independent research to see if there are any resources available for cost constraints of getting a service dog for elderly patients.  . Advised patient to call the office for changes in mood/depression/anxiety . Provided education to patient re: effective management of depression and anxiety and the ability to send information to the patient through  the my chart functionality. 09-27-2020: Review and recommendations given for alternate activities . Discussed plans with patient for ongoing care management follow up and provided patient with direct contact information for care management team  Patient Goals/Self-Care Activities Over the next 120  days, patient will:  - Patient will self administer medications as prescribed Patient will attend all scheduled provider appointments Patient will call pharmacy for medication refills Patient will attend church or other social activities Patient will continue to perform ADL's independently Patient will continue to perform IADL's independently Patient will  call provider office for new concerns or questions Patient will work with BSW to address care coordination needs and will continue to work with the clinical team to address health care and disease management related needs.   - anxiety screen reviewed - depression screen reviewed  Follow Up Plan: Telephone follow up appointment with care management team member scheduled for: 12-06-2020 at 0930 am       Care Plan : RNCM: Management of GI symptoms: Chronic Nausea/Diarrhea  Updates made by Vanita Ingles since 09/27/2020 12:00 AM    Problem: RNCM: Management of GI Issues: Diarrhea/Nausea/Constipation   Priority: High    Long-Range Goal: RNCM: Diarrhea/Nausea/Constipation  Managed   Priority: High  Note:   Current Barriers:  [name] Knowledge Deficits related to etiology of bacteria in her small intestines and why the abx is not getting rid of the bacteria  . Chronic Disease Management support and education needs related to effective management of GI flare ups/chronic nausea/diarrhea and sometimes constipation . Lacks caregiver support.  . Unable to independently manage chronic gi symptoms  . Unable to self administer medications as prescribed . Lacks social connections . Does not contact provider office for questions/concerns  Nurse Case Manager Clinical Goal(s):  [name] Over the next 120 days, patient will verbalize understanding of plan for effective management of GI issues . Over the next 120 days, patient will work with RNCM, pcp, and specialist  to address needs related to Chronic nausea, diarrhea, and constipation  . Over the next 120 days, patient will demonstrate a decrease in nausea and GI symptom  exacerbations as evidenced by no evidence of nausea, bowel habits within normal limits, and tolerating po intake without difficulty  Interventions:  . 1:1 collaboration with Jon Billings, NP regarding development and update of comprehensive plan of care as evidenced by provider attestation  and co-signature . Inter-disciplinary care team collaboration (see longitudinal plan of care) . Evaluation of current treatment plan related to Nausea/diarrhea/constipation  and patient's adherence to plan as established by provider. 09-27-2020: The patient saw the pcp last week. Was not having a good week with her GI symptoms. The patient is having a better week. States that she is "experimenting" and started using a half a cap full of Miralax in her coffee each morning. That was working well so she moved to a full cap full of Miralax. This is working well for her. She is trying to get an earlier appointment with the GI specialist. Currently the first available appointment is 11-29-2020. She would like the RNCM to let the pcp know the Miralax is working good right now.  . Advised patient to write down questions to ask the GI specialist. The patient states she is calling the GI provider tomorrow. She was on the high potency ABX for the bacteria found in her small intestines and each time she gets near the dose of the ABX she starts having the bad symptoms she was having.  She is currently on her second round of ABX therapy. She wants to know why she is still having the symptoms and why the medication is not taking care of the diarrhea. Empathetic listening and support given. 09-27-2020: The patient niece is  helping her with getting appointments with the GI specialist. The patient has an appointment for 11-29-2020 but is hopeful to get a sooner appointment.  . Provided education to patient re: discussing concerns with the GI Specialist and the ability for the [facility] to provide educational material by my chart system and EMMI . Reviewed medications with patient and discussed : compliance and the use of Xifanxin 550 mg TID for 2 weeks. The patient states she has been on 2 rounds of the medication and symptoms improve until she ends therapy. She wants to talk to the provider about what is causing the bacteria and why the  medication is not getting rid of it. 09-27-2020: The patient is using Miralax and that is currently helping her symptoms.  . Discussed plans with patient for ongoing care management follow up and provided patient with direct contact information for care management team  Patient Goals/Self-Care Activities Over the next 120 days, patient will:  - Patient will self administer medications as prescribed Patient will attend all scheduled provider appointments Patient will call pharmacy for medication refills Patient will attend church or other social activities Patient will continue to perform ADL's independently Patient will continue to perform IADL's independently Patient will call provider office for new concerns or questions Patient will work with BSW to address care coordination needs and will continue to work with the clinical team to address health care and disease management related needs.   - diet adjustment recommended - fluid status assessed and trended - medication side effects managed - response to pharmacologic therapy monitored - weight assessed and trended  Follow Up Plan: Telephone follow up appointment with care management team member scheduled for: 12-06-2020 at 0930 am      Care Plan : RNCM: Fall Risk (Adult)  Updates made by Vanita Ingles since 09/27/2020 12:00 AM    Problem: RNCM: Fall Risk   Priority: High    Long-Range Goal: RNCM: Absence of Fall and Fall-Related Injury   Priority: High  Note:   Current Barriers:  [name] Knowledge Deficits related to fall precautions in patient with anxiety, high risk for falls and other chronic conditions impacting patients health and well being.  . Decreased adherence to prescribed treatment for fall prevention . Unable to independently manage falls prevention as evident of a fall on 09/25/2020 and a fall before pcp visit on 09-18-2020 . Lacks social connections . Unable to perform IADLs independently . Does not maintain contact with  provider office . Knowledge Deficits related to resources available for service animal help and safety devices to help with fall prevention and safety  . Care Coordination needs related to asking for help with obtaining resources for a service dog  in a patient with frequent falls, anxiety, and multiple chronic conditions impacting her health and well being.  . Chronic Disease Management support and education needs related to anxiety, depression, falls, and other chronic conditions warranting increased management of fall prevention and safety  . Lacks caregiver support.  . Film/video editor.  Clinical Goal(s):  . patient will demonstrate improved adherence to prescribed treatment plan for decreasing falls as evidenced by patient reporting and review of EMR . patient will verbalize using fall risk reduction strategies discussed . patient will not  experience additional falls . patient will verbalize understanding of plan for effective management of falls prevention and safety  . patient will work with [facility], [place] team, and pcp to address needs related to fall prevention and safety . patient will demonstrate a decrease in fall exacerbations as evidenced by no new falls- last fall 09-25-2020- denies injury or issues related to fall . patient will attend all scheduled medical appointments: 10-19-2020 at 4 pm Interventions:  . Collaboration with Jon Billings, NP regarding development and update of comprehensive plan of care as evidenced by provider attestation and co-signature . Inter-disciplinary care team collaboration (see longitudinal plan of care) . Provided written and verbal education re: Potential causes of falls and Fall prevention strategies . Reviewed medications and discussed potential side effects of medications such as dizziness and frequent urination . Assessed for s/s of orthostatic hypotension . Assessed for falls since last encounter. . Assessed patients knowledge of fall risk  prevention secondary to previously provided education. . Assessed working status of life alert bracelet and patient adherence . Provided patient information for fall alert systems . Evaluation of current treatment plan related to fall prevention and safety  and patient's adherence to plan as established by provider. . Advised patient to call the office for new falls and seek emergent care for falls with injuries  . Provided education to patient re: fall prevention and safety in her surroundings, also to call [facility] customer service number and inquire about the [place] life line system available through her insurance provider.  . Reviewed medications with patient and discussed compliance and any medications that may cause increase risk for falls. . Provided patient with falls prevention and safety  educational materials related to preventing falls in her home and surroundings  . Reviewed scheduled/upcoming provider appointments including: 10-19-2020 at 4 pm . Care Guide referral for help with resources for a service dog . Discussed plans with patient for ongoing care management follow up and provided patient with direct contact information for care management team Self-Care Deficits:  Unable to independently manage falls  Lacks social connections Unable to perform IADLs independently Does not maintain contact with provider office Patient Goals:  - Utilize walker (assistive device) appropriately with all ambulation - De-clutter walkways - Change positions slowly - Wear secure fitting shoes at all times with ambulation - Utilize home lighting for dim lit areas - Demonstrate self and pet awareness at all times - activities of daily living skills assessed - assistive or adaptive device use encouraged - barriers to physical activity or exercise addressed - barriers to physical activity or exercise identified - barriers to safety identified - cognition assessed - cognitive-stimulating activities  promoted - fall prevention plan reviewed and updated - fear of falling, loss of independence and pain acknowledged - medication list reviewed - modification of home and work environment promoted Follow Up Plan: Telephone follow up appointment with care management team member scheduled for: 12-06-2020 at 0930 am   Follow Up Date: 12-06-2020   - add more outdoor lighting - always use handrails on the stairs - always wear low-heeled or flat shoes or slippers with nonskid soles - call the doctor if I am feeling too drowsy - install bathroom grab bars - join an exercise group in my community - keep a flashlight by the bed - keep my cell phone with me always - learn how to get back up if I fall - make an emergency alert plan in case I fall - pick up clutter from  the floors - use a nonslip pad with throw rugs, or remove them completely - use a cane or walker - use a nightlight in the bathroom    Why is this important?    Most falls happen when it is hard for you to walk safely. Your balance may be off because of an illness. You may have pain in your knees, hip or other joints.   You may be overly tired or taking medicines that make you sleepy. You may not be able to see or hear clearly.   Falls can lead to broken bones, bruises or other injuries.   There are things you can do to help prevent falling.     Notes: Last reported fall on 09-25-2020   Task: RNCM: Identify and Manage Contributors to Fall Risk   Note:   Care Management Activities:    - activities of daily living skills assessed - assistive or adaptive device use encouraged - barriers to physical activity or exercise addressed - barriers to physical activity or exercise identified - barriers to safety identified - cognition assessed - cognitive-stimulating activities promoted - fall prevention plan reviewed and updated - fear of falling, loss of independence and pain acknowledged - medication list reviewed - modification  of home and work environment promoted         Plan:Telephone follow up appointment with care management team member scheduled for:  12-06-2020 at 0930 am  [place], MSN, [facility] Mobile: [phone]

## 2020-09-27 NOTE — Patient Instructions (Signed)
Visit Information  PATIENT GOALS: Goals Addressed              This Visit's Progress   .  RNCM: "I get depressed often now because I can't do for myself." (pt-stated)        Current Barriers:  . Chronic Mental Health needs related to bipolar, depression and anxiety . Limited social support . Transportation . ADL IADL limitations . Mental Health Concerns  . Social Isolation . Memory Deficits . Inability to perform ADL's independently . Inability to perform IADL's independently . Suicidal Ideation/Homicidal Ideation: No  Clinical Social Work Goal(s):  [name] Over the next 120 days, patient/caregiver will work with SW to address concerns related to gaining additional support/resource connection in order to maintain health and mental health appropriately  . Over the next 120 days, patient will demonstrate improved adherence to self care as evidenced by implementing healthy self-care into her daily routine such as: attending all medical appointments, contacting mental health resources that were emailed, deep breathing exercises, taking time for self-reflection, taking medications as prescribed, drinking water and daily exercise to improve mobility.  . Over the next 120 days, patient will work with SW  and therapist bi-monthly by telephone or in person to reduce or manage symptoms related to anxiety and depression.  . Over the next 120 days, patient will demonstrate improved health management independence as evidenced by implementing healthy self-care and positive support/resources into her daily routine to cope with stressors/mental health symptoms and improve overall health and well-being   Interventions: . Patient interviewed and appropriate assessments performed: brief mental health assessment . LCSW spoke with both patient and caregiver (niece) during last outreach. Patient recently changed primary care providers and is in need of mental health support implementation. Patient lives alone but  has niece come check on her multiple times per day. Niece or grandchildren will sometimes stay the night with patient if needed. Patient's daughter have not been involved with her care which has upset patient.  . Patient complaints of ongoing muscle weakness. Patient has had recent falls. Patient is currently receiving PT in the home. Patient lost her service dog in January of this year and is unable to take appropriate care of their new service dog that is in training so patient's niece has agreed to take in this new service dog in and allow it to visit patient during the day to help lift patient's mood. . Patient interviewed and appropriate assessments performed . Provided mental health counseling with regard to *depression and anxiety management.  Email sent to niece with available mental health resource list. Education provided.  . Provided patient with information about available socialization opportunities within patient's nearby area. Email sent to niece with information on these [facility]. Patient reports receiving socialization through her neighbor that comes over daily and by going on outings with her niece.  . Discussed plans with patient for ongoing care management follow up and provided patient with direct contact information for care management team . Advised patient to check email and to contact CCM LCSW for any future social work related needs.  . Assisted patient/caregiver with obtaining information about health plan benefits . Provided education and assistance to client regarding Advanced Directives. . Provided education to patient/caregiver about Hospice and/or Palliative Care services . Referred patient to [facility] (mental health provider) for long term follow up and therapy/counseling . Brief CBTprovided during session today.  [name] UPDATE- [name] reports that patient is currently receiving PT and  she is currently running errands for patient. She reports that she went  by [facility] to ask for PCP to make a referral to a different GI provider because they are unable to wait until the end of September. Niece wondered if LCSW could inquire about this with PCP. LCSW will sent in basket message. [name] reports that she is looking into hiring in home care and has been in communication with [facility]. She reports that they dropped off paperwork for PCP to sign. LCSW will also inquire with PCP about this as there are no notes in chart regarding this request. . [name] is requesting additional assistance and support due to patent's worsening health conditions. Patient had a recent ED visit and has been experiencing dry heaving after taking medication. She has been experiencing weakness, fatigue and diarrhea as well and reported today "I am tired of feeling so bad" to niece. LCSW will update referral for CCM RNCM.  Patient Self Care Activities:  . Attends all scheduled provider appointments . Calls provider office for new concerns or questions . Ability for insight . Motivation for treatment . Strong family or social support  Patient Coping Strengths:  . Supportive Relationships . Hopefulness . Self Advocate . Able to Communicate Effectively  Patient Self Care Deficits:  . Lacks social connections . Unable to perform ADLs independently . Unable to perform IADLs independently  Please see past updates related to this goal by clicking on the "Past Updates" button in the selected goal      .  RNCM: Prevent Falls and Injury        Follow Up Date: 12-06-2020   - add more outdoor lighting - always use handrails on the stairs - always wear low-heeled or flat shoes or slippers with nonskid soles - call the doctor if I am feeling too drowsy - install bathroom grab bars - join an exercise group in my community - keep a flashlight by the bed - keep my cell phone with me always - learn how to get back up if I fall - make an emergency alert plan in case I fall - pick  up clutter from the floors - use a nonslip pad with throw rugs, or remove them completely - use a cane or walker - use a nightlight in the bathroom    Why is this important?    Most falls happen when it is hard for you to walk safely. Your balance may be off because of an illness. You may have pain in your knees, hip or other joints.   You may be overly tired or taking medicines that make you sleepy. You may not be able to see or hear clearly.   Falls can lead to broken bones, bruises or other injuries.   There are things you can do to help prevent falling.     Notes: Last reported fall on 09-25-2020       Patient verbalizes understanding of instructions provided today and agrees to view in [place].   Telephone follow up appointment with care management team member scheduled for: 12-06-2020 at 0930 am   Noreene Larsson RN, MSN, [facility] Mobile: [phone]

## 2020-09-28 ENCOUNTER — Other Ambulatory Visit: Payer: Self-pay | Admitting: Gastroenterology

## 2020-09-28 ENCOUNTER — Telehealth: Payer: Self-pay

## 2020-09-28 ENCOUNTER — Other Ambulatory Visit (HOSPITAL_COMMUNITY): Payer: Self-pay | Admitting: Gastroenterology

## 2020-09-28 DIAGNOSIS — R1084 Generalized abdominal pain: Secondary | ICD-10-CM

## 2020-09-28 DIAGNOSIS — K6389 Other specified diseases of intestine: Secondary | ICD-10-CM

## 2020-09-28 NOTE — Telephone Encounter (Signed)
   Telephone encounter was:  Unsuccessful.  09/28/2020 Name: Sydney Evans MRN: [id number] DOB: 02-03-1951  Unsuccessful outbound call made today to assist with:  service dog  Outreach Attempt:  1st Attempt  A HIPAA compliant voice message was left requesting a return call.  Instructed patient to call back at [phone].  Caryl Manas, AAS Paralegal, [place] . Embedded Care Coordination [facility]  Care Management  [address], [place] [address] ??[email or web address]  ?? [phone]   [email or web address]

## 2020-09-29 ENCOUNTER — Telehealth: Payer: Self-pay

## 2020-09-29 ENCOUNTER — Other Ambulatory Visit: Payer: Self-pay

## 2020-09-29 NOTE — Telephone Encounter (Signed)
   Telephone encounter was:  Successful.  09/29/2020 Name: Malissia Rabbani MRN: [id number] DOB: November 29, 1950  Indiya Izquierdo is a 70 y.o. year old female who is a primary care patient of Jon Billings, NP . The community resource team was consulted for assistance with service dog traning and med alert  Care guide performed the following interventions: Patient provided with information about care guide support team and interviewed to confirm resource needs Spoke with patient about possible resources for service dog training. Gave information for [employer], Animal services of [place] and suggested Med Alert to assist patient.  .  Follow Up Plan:  No further follow up planned at this time. The patient has been provided with needed resources.  Chrystian Ressler, AAS Paralegal, [place] . Embedded Care Coordination [facility]  Care Management  [address] [place],  [address] ??[email or web address]  ?? [phone]   [email or web address]

## 2020-10-03 MED ORDER — FLUTICASONE PROPIONATE 50 MCG/ACT NA SUSP: 2.0000 | Freq: Every day | NASAL | 12 refills | Status: DC | PRN

## 2020-10-05 ENCOUNTER — Telehealth: Payer: Self-pay | Admitting: Nurse Practitioner

## 2020-10-05 NOTE — Telephone Encounter (Signed)
Patient niece called in and stated the medication the pt was taking citalopram (CELEXA) 40 MG tablet, pt states she no longer wants to take anymore because it is not working, pt states she wants to try something else stronger to help her sleep. Pt asked for someone to reach out. Please advise

## 2020-10-05 NOTE — Telephone Encounter (Signed)
Please advise 

## 2020-10-05 NOTE — Telephone Encounter (Signed)
The Celexa is not for sleep it is to help with her mood.  If she would like to talk about something for sleep she should make an appointment.

## 2020-10-06 ENCOUNTER — Ambulatory Visit: Payer: Self-pay | Admitting: *Deleted

## 2020-10-06 ENCOUNTER — Encounter: Payer: Self-pay | Admitting: Nurse Practitioner

## 2020-10-06 ENCOUNTER — Ambulatory Visit (INDEPENDENT_AMBULATORY_CARE_PROVIDER_SITE_OTHER): Payer: Medicare Other | Admitting: Nurse Practitioner

## 2020-10-06 ENCOUNTER — Other Ambulatory Visit: Payer: Self-pay

## 2020-10-06 VITALS — BP 156/63 | HR 54

## 2020-10-06 DIAGNOSIS — G479 Sleep disorder, unspecified: Secondary | ICD-10-CM | POA: Diagnosis not present

## 2020-10-06 MED ORDER — ZOLPIDEM TARTRATE 5 MG PO TABS: 5.0000 mg | ORAL_TABLET | Freq: Every evening | ORAL | 1 refills | Status: DC | PRN

## 2020-10-06 NOTE — Assessment & Plan Note (Signed)
Chronic.  Uncontrolled.  Patient has tried several OTC sleep aids and trazodone in the past which have not helped her.  Will start Ambien 5mg  once nightly. Do not take with Lamtical due to increased risk of sedation.  Discussed side effects of hypnotic medication.  Patient is aware and will take precaution.

## 2020-10-06 NOTE — Progress Notes (Signed)
BP (!) 156/63   Pulse (!) 54   SpO2 99%    Subjective:    Patient ID: Sydney Evans, female    DOB: 1951/05/06, 70 y.o.   MRN: [id number]  HPI: Elisandra Deshmukh is a 70 y.o. female  Chief Complaint  Patient presents with  . Insomnia    Tried multiple otc medication and techniques to try to help her sleep, none of it has helped     Patient states she has not been able to sleep.  This has been going on since she was sick.  She doesn't do any activities.  She is using a bathroom chair.  She has not been leaving the couch.  She is afraid she is going to fall if she gets up off the couch without someone there.  She does have a dog which is not trained to be a Neurosurgeon.  He has had on in the past but it passed away.   INSOMNIA Duration: months Satisfied with sleep quality: no Difficulty falling asleep: yes Difficulty staying asleep: yes Waking a few hours after sleep onset: yes Early morning awakenings: yes Daytime hypersomnolence: yes Wakes feeling refreshed: no Good sleep hygiene: no Apnea: no Snoring: yes Depressed/anxious mood: yes Recent stress: yes Restless legs/nocturnal leg cramps: sometimes Chronic pain/arthritis: no History of sleep study: no Treatments attempted: benadryl, melatonin, equate sleep aid, zzquil, using lavender    Relevant past medical, surgical, family and social history reviewed and updated as indicated. Interim medical history since our last visit reviewed. Allergies and medications reviewed and updated.  Review of Systems  Psychiatric/Behavioral: Positive for dysphoric mood and sleep disturbance. Negative for suicidal ideas. The patient is nervous/anxious.     Per HPI unless specifically indicated above     Objective:    BP (!) 156/63   Pulse (!) 54   SpO2 99%   Wt Readings from Last 3 Encounters:  09/18/20 147 lb 12.8 oz (67 kg)  05/12/20 142 lb (64.4 kg)  03/17/20 143 lb 3.2 oz (65 kg)    Physical Exam Vitals and nursing note  reviewed.  Constitutional:      General: She is not in acute distress.    Appearance: Normal appearance. She is normal weight. She is not ill-appearing, toxic-appearing or diaphoretic.  HENT:     Head: Normocephalic.     Right Ear: External ear normal.     Left Ear: External ear normal.     Nose: Nose normal.     Mouth/Throat:     Mouth: Mucous membranes are moist.     Pharynx: Oropharynx is clear.  Eyes:     General:        Right eye: No discharge.        Left eye: No discharge.     Extraocular Movements: Extraocular movements intact.     Conjunctiva/sclera: Conjunctivae normal.     Pupils: Pupils are equal, round, and reactive to light.  Cardiovascular:     Rate and Rhythm: Normal rate and regular rhythm.     Heart sounds: No murmur heard.   Pulmonary:     Effort: Pulmonary effort is normal. No respiratory distress.     Breath sounds: Normal breath sounds. No wheezing or rales.  Musculoskeletal:     Cervical back: Normal range of motion and neck supple.  Skin:    General: Skin is warm and dry.     Capillary Refill: Capillary refill takes less than 2 seconds.  Neurological:  General: No focal deficit present.     Mental Status: She is alert and oriented to person, place, and time. Mental status is at baseline.  Psychiatric:        Mood and Affect: Mood normal.        Behavior: Behavior normal.        Thought Content: Thought content normal.        Judgment: Judgment normal.     Results for orders placed or performed in visit on 03/17/20  CBC with Differential/Platelet  Result Value Ref Range   WBC 4.9 3.4 - 10.8 x10E3/uL   RBC 4.15 3.77 - 5.28 x10E6/uL   Hemoglobin 9.1 (L) 11.1 - 15.9 g/dL   Hematocrit 30.8 (L) 34.0 - 46.6 %   MCV 74 (L) 79 - 97 fL   MCH 21.9 (L) 26.6 - 33.0 pg   MCHC 29.5 (L) 31.5 - 35.7 g/dL   RDW 17.9 (H) 11.7 - 15.4 %   Platelets 396 150 - 450 x10E3/uL   Neutrophils 59 Not Estab. %   Lymphs 31 Not Estab. %   Monocytes 7 Not Estab. %    Eos 1 Not Estab. %   Basos 2 Not Estab. %   Neutrophils Absolute 2.9 1.4 - 7.0 x10E3/uL   Lymphocytes Absolute 1.5 0.7 - 3.1 x10E3/uL   Monocytes Absolute 0.4 0.1 - 0.9 x10E3/uL   EOS (ABSOLUTE) 0.1 0.0 - 0.4 x10E3/uL   Basophils Absolute 0.1 0.0 - 0.2 x10E3/uL   Immature Granulocytes 0 Not Estab. %   Immature Grans (Abs) 0.0 0.0 - 0.1 x10E3/uL  Comprehensive metabolic panel  Result Value Ref Range   Glucose 81 65 - 99 mg/dL   BUN 9 8 - 27 mg/dL   Creatinine, Ser 0.82 0.57 - 1.00 mg/dL   GFR calc non Af Amer 74 >59 mL/min/1.73   GFR calc Af Amer 86 >59 mL/min/1.73   BUN/Creatinine Ratio 11 (L) 12 - 28   Sodium 139 134 - 144 mmol/L   Potassium 3.4 (L) 3.5 - 5.2 mmol/L   Chloride 104 96 - 106 mmol/L   CO2 21 20 - 29 mmol/L   Calcium 9.4 8.7 - 10.3 mg/dL   Total Protein 6.2 6.0 - 8.5 g/dL   Albumin 4.0 3.8 - 4.8 g/dL   Globulin, Total 2.2 1.5 - 4.5 g/dL   Albumin/Globulin Ratio 1.8 1.2 - 2.2   Bilirubin Total 0.3 0.0 - 1.2 mg/dL   Alkaline Phosphatase 96 44 - 121 IU/L   AST 16 0 - 40 IU/L   ALT 9 0 - 32 IU/L  Lipid Panel w/o Chol/HDL Ratio  Result Value Ref Range   Cholesterol, Total 221 (H) 100 - 199 mg/dL   Triglycerides 69 0 - 149 mg/dL   HDL 59 >39 mg/dL   VLDL Cholesterol Cal 12 5 - 40 mg/dL   LDL Chol Calc (NIH) 150 (H) 0 - 99 mg/dL  TSH  Result Value Ref Range   TSH 3.920 0.450 - 4.500 uIU/mL  VITAMIN D 25 Hydroxy (Vit-D Deficiency, Fractures)  Result Value Ref Range   Vit D, 25-Hydroxy 32.7 30.0 - 100.0 ng/mL  Iron and TIBC  Result Value Ref Range   Total Iron Binding Capacity 411 250 - 450 ug/dL   UIBC 392 (H) 118 - 369 ug/dL   Iron 19 (L) 27 - 139 ug/dL   Iron Saturation 5 (LL) 15 - 55 %  Ferritin  Result Value Ref Range   Ferritin 8 (L) 15 -  150 ng/mL  Lamotrigine level  Result Value Ref Range   Lamotrigine Lvl 8.5 2.0 - 20.0 ug/mL      Assessment & Plan:   Problem List Items Addressed This Visit      Other   Sleep disturbance - Primary     Chronic.  Uncontrolled.  Patient has tried several OTC sleep aids and trazodone in the past which have not helped her.  Will start Ambien 5mg  once nightly. Do not take with Lamtical due to increased risk of sedation.  Discussed side effects of hypnotic medication.  Patient is aware and will take precaution.           Follow up plan: Return in about 2 weeks (around 10/20/2020) for Insomnia.

## 2020-10-06 NOTE — Telephone Encounter (Signed)
Patient was notified of Sydney Billings, NP recommendations. Patient will speak with her transportation [name] and give our office a call back to get an appointment scheduled. As, [name] is the only way she gets to and from her appointment.

## 2020-10-06 NOTE — Telephone Encounter (Signed)
"  Patients care--taker wendy jennings, calling about trying to get medication or resolution for patient not being able to sleep. She says she has a full week of to her errands to do and get really make an appt and can't fit wheelchair in her car. Please cal back advise"; contacted pt's caregiver, Joni Reining, to discuss pt's symptoms; she says the pt not sleeping only an hour a night despite taking medication; the pt has taken Melatonin, and other OTC sleep aids, this was discussed in her office visit on 09/18/20; she would like the pt to call something in for the pt to sleep; she can not bring the pt in for an appt until Thursday;  She would ultimately like for the pt to be seen today 10/06/20; pt transferred to [facility] for scheduling.  Reason for Disposition . Requesting medication for sleep ("sleeping pill")  Answer Assessment - Initial Assessment Questions 1. DESCRIPTION: "Tell me about your sleeping problem."      Ongoing insomnia 2. ONSET: "How long have you been having trouble sleeping?" (e.g., days, weeks, months)      3. RECURRENT: "Have you had sleeping problems before?"  If Yes, ask: "What happened that time?" "What helped your sleeping problem go away in the past?"      Ongoing; last seen 09/18/20 4. STRESS: "Is there anything in your life that is making you feel stressed or tense?"      5. PAIN: "Do you have any pain that is keeping you awake?" (e.g., back pain, headache, abdominal pain)      6. CAFFEINE ABUSE: "Do you drink caffeinated beverages, and how much each day?" (e.g., coffee, tea, colas)      7. ALCOHOL USE OR SUBSTANCE USE (DRUG USE): "Do you drink alcohol or use any illegal drugs?"    8. OTHER SYMPTOMS: "Do you have any other symptoms?"  (e.g., difficulty breathing)  Protocols used: INSOMNIA-A-AH

## 2020-10-06 NOTE — Telephone Encounter (Signed)
Scheduled today at 1:00 with Santiago Glad

## 2020-10-11 ENCOUNTER — Other Ambulatory Visit: Payer: Self-pay | Admitting: Gastroenterology

## 2020-10-11 ENCOUNTER — Ambulatory Visit
Admission: RE | Admit: 2020-10-11 | Discharge: 2020-10-11 | Disposition: A | Discharge status: Home / Self Care | Payer: Medicare Other | Source: Ambulatory Visit | Attending: Gastroenterology | Admitting: Gastroenterology

## 2020-10-11 ENCOUNTER — Other Ambulatory Visit: Payer: Self-pay

## 2020-10-11 DIAGNOSIS — K6389 Other specified diseases of intestine: Secondary | ICD-10-CM

## 2020-10-11 DIAGNOSIS — K559 Vascular disorder of intestine, unspecified: Secondary | ICD-10-CM | POA: Diagnosis present

## 2020-10-11 DIAGNOSIS — R1084 Generalized abdominal pain: Secondary | ICD-10-CM

## 2020-10-11 LAB — POCT I-STAT CREATININE: Creatinine, Ser: 0.8 mg/dL (ref 0.44–1.00)

## 2020-10-11 MED ORDER — IOHEXOL 350 MG/ML SOLN
100.0000 mL | Freq: Once | INTRAVENOUS | Status: AC | PRN
  Administered 2020-10-11: 100 mL via INTRAVENOUS

## 2020-10-12 ENCOUNTER — Other Ambulatory Visit: Payer: Self-pay | Admitting: Family Medicine

## 2020-10-12 NOTE — Telephone Encounter (Signed)
Future OV 10/19/20. Approved per protocol.  Requested Prescriptions  Pending Prescriptions Disp Refills  . omeprazole (PRILOSEC) 40 MG capsule [Pharmacy Med Name: OMEPRAZOLE DR 40 MG CAPSULE] 135 capsule 0    Sig: Take 1-2 capsules (40-80 mg total) by mouth daily.     Gastroenterology: Proton Pump Inhibitors Passed - 10/12/2020 12:35 PM      Passed - Valid encounter within last 12 months    Recent Outpatient Visits          6 days ago Sleep disturbance   [facility] Jon Billings, NP   3 weeks ago Bipolar depression [facility])   [facility] Jon Billings, NP   6 months ago Essential hypertension   [place], [place], DO   10 months ago Chronic nausea   [place], [place]   10 months ago Nausea and vomiting, intractability of vomiting not specified, unspecified vomiting type   [facility], Lilia Argue, [place]      Future Appointments            In 1 week Jon Billings, NP [facility], [place]   In 2 months  [employer], [place]

## 2020-10-19 ENCOUNTER — Ambulatory Visit (INDEPENDENT_AMBULATORY_CARE_PROVIDER_SITE_OTHER): Payer: Medicare Other | Admitting: Licensed Clinical Social Worker

## 2020-10-19 ENCOUNTER — Ambulatory Visit (INDEPENDENT_AMBULATORY_CARE_PROVIDER_SITE_OTHER): Payer: Medicare Other | Admitting: Nurse Practitioner

## 2020-10-19 ENCOUNTER — Encounter: Payer: Self-pay | Admitting: Nurse Practitioner

## 2020-10-19 ENCOUNTER — Other Ambulatory Visit: Payer: Self-pay

## 2020-10-19 VITALS — BP 115/69 | HR 50

## 2020-10-19 DIAGNOSIS — F319 Bipolar disorder, unspecified: Secondary | ICD-10-CM

## 2020-10-19 DIAGNOSIS — I10 Essential (primary) hypertension: Secondary | ICD-10-CM

## 2020-10-19 DIAGNOSIS — G479 Sleep disorder, unspecified: Secondary | ICD-10-CM

## 2020-10-19 DIAGNOSIS — F419 Anxiety disorder, unspecified: Secondary | ICD-10-CM

## 2020-10-19 MED ORDER — NORTRIPTYLINE HCL 10 MG PO CAPS: 10.0000 mg | ORAL_CAPSULE | Freq: Every day | ORAL | 0 refills | Status: DC

## 2020-10-19 NOTE — Patient Instructions (Signed)
Visit Information   Goals Addressed             This Visit's Progress    Management of MH symptoms   On track    Patient Self Care Activities:  Attend all scheduled provider appointments Call provider office for new concerns or questions Continue compliance with med management Continue utilization of healthy coping skills         Patient verbalizes understanding of instructions provided today and agrees to view in [place].   Telephone follow up appointment with care management team member scheduled for:01/04/21  Christa See, MSW, [email or web address] Phone [phone] 2:00 PM

## 2020-10-19 NOTE — Progress Notes (Signed)
BP 115/69   Pulse (!) 50   SpO2 100%    Subjective:    Patient ID: Sydney Evans, female    DOB: 23-Apr-1951, 70 y.o.   MRN: [id number]  HPI: Sydney Evans is a 70 y.o. female  Chief Complaint  Patient presents with   sleep issues    Patient states that she tried the [place] for close to a week and it did not help so she stopped taking it   Patient continues to have sleep issues. She took the Ambien and it helped for two nights then it stopped working.  Patient stopped taking the medication. Continues to struggle with sleep.  She gets 1-2 hours of sleep per night.   States her abdominal issues have resolved.  Is feeling a little better but feels like the sleep issues are causing her to still feel very fatigued.    Relevant past medical, surgical, family and social history reviewed and updated as indicated. Interim medical history since our last visit reviewed. Allergies and medications reviewed and updated.  Review of Systems  Constitutional:  Positive for fatigue.  Psychiatric/Behavioral:  Positive for sleep disturbance.    Per HPI unless specifically indicated above     Objective:    BP 115/69   Pulse (!) 50   SpO2 100%   Wt Readings from Last 3 Encounters:  09/18/20 147 lb 12.8 oz (67 kg)  05/12/20 142 lb (64.4 kg)  03/17/20 143 lb 3.2 oz (65 kg)    Physical Exam Vitals and nursing note reviewed.  Constitutional:      General: She is not in acute distress.    Appearance: Normal appearance. She is normal weight. She is not ill-appearing, toxic-appearing or diaphoretic.  HENT:     Head: Normocephalic.     Right Ear: External ear normal.     Left Ear: External ear normal.     Nose: Nose normal.     Mouth/Throat:     Mouth: Mucous membranes are moist.     Pharynx: Oropharynx is clear.  Eyes:     General:        Right eye: No discharge.        Left eye: No discharge.     Extraocular Movements: Extraocular movements intact.     Conjunctiva/sclera:  Conjunctivae normal.     Pupils: Pupils are equal, round, and reactive to light.  Cardiovascular:     Rate and Rhythm: Normal rate and regular rhythm.     Heart sounds: No murmur heard. Pulmonary:     Effort: Pulmonary effort is normal. No respiratory distress.     Breath sounds: Normal breath sounds. No wheezing or rales.  Musculoskeletal:     Cervical back: Normal range of motion and neck supple.  Skin:    General: Skin is warm and dry.     Capillary Refill: Capillary refill takes less than 2 seconds.  Neurological:     General: No focal deficit present.     Mental Status: She is alert and oriented to person, place, and time. Mental status is at baseline.  Psychiatric:        Mood and Affect: Mood normal.        Behavior: Behavior normal.        Thought Content: Thought content normal.        Judgment: Judgment normal.    Results for orders placed or performed during the hospital encounter of 10/11/20  I-STAT creatinine  Result Value Ref Range  Creatinine, Ser 0.80 0.44 - 1.00 mg/dL      Assessment & Plan:   Problem List Items Addressed This Visit       Other   Sleep disturbance - Primary    Ongoing.  Not controlled. Will try Nortryptiline nightly to see if sleep improves.  Discussed side effects and benefits of medication with patient during visit. Follow up in 1 month.       Relevant Medications   nortriptyline (PAMELOR) 10 MG capsule     Follow up plan: Return in about 1 month (around 11/18/2020) for insomnia.   A total of 30 minutes were spent on this encounter today.  When total time is documented, this includes both the face-to-face and non-face-to-face time personally spent before, during and after the visit on the date of the encounter.

## 2020-10-19 NOTE — Chronic Care Management (AMB) (Signed)
Chronic Care Management    Clinical Social Work Note  10/19/2020 Name: Sydney Evans MRN: [id number] DOB: 1950-10-25  Jeslie Lowe is a 70 y.o. year old female who is a primary care patient of Jon Billings, NP. The CCM team was consulted to assist the patient with chronic disease management and/or care coordination needs related to: Mental Health Counseling and Resources.   Engaged with patient by telephone for follow up visit in response to provider referral for social work chronic care management and care coordination services.   Consent to Services:  The patient was given information about Chronic Care Management services, agreed to services, and gave verbal consent prior to initiation of services.  Please see initial visit note for detailed documentation.   Patient agreed to services and consent obtained.   Assessment: Patient is engaged in conversation, continues to maintain positive progress with care plan goals. Symptoms of depression and anxiety have significantly decreased, per pt. She is currently experiencing difficulty with managing insomnia symptoms. CCM LCSW provided validation and encouragement. Strategies to assist with enhancing sleep hygiene discussed. See Care Plan below for interventions and patient self-care actives. Recent life changes /stressors: Insomnia Recommendation: Patient may benefit from, and is in agreement to work with LCSW to address care coordination needs and will continue to work with the clinical team to address health care and disease management related needs.  Follow up Plan: Patient would like continued follow-up.  CCM LCSW will follow up with patient 01/04/21. Patient will call office if needed prior to next encounter.  SDOH (Social Determinants of Health) assessments and interventions performed:    Advanced Directives Status: Not addressed in this encounter.  CCM Care Plan  Allergies  Allergen Reactions   Hydroxyzine Hives and Rash    Gabapentin     Dizzy and confusion   Dilaudid [Hydromorphone] Itching   Hydrocodone Hives and Rash    "terrible scratching"  Make take with benadryl   Ketorolac Rash     May take with benadryl   Percocet [Oxycodone-Acetaminophen] Itching and Rash    May take with benadryl     Toradol [Ketorolac Tromethamine] Rash    May take with benadryl    Outpatient Encounter Medications as of 10/19/2020  Medication Sig   Carboxymethylcellulose Sodium (LUBRICANT EYE DROPS OP) Place 1 drop into both eyes daily as needed (Dry eye). Systane   citalopram (CELEXA) 40 MG tablet Take 1.5 tablets (60 mg total) by mouth daily.   diclofenac Sodium (VOLTAREN) 1 % GEL Apply 4 g topically 4 (four) times daily.   diphenhydrAMINE (BENADRYL) 25 MG tablet Take 25 mg by mouth every 6 (six) hours as needed for itching.   fluticasone (FLONASE) 50 MCG/ACT nasal spray Place 2 sprays into both nostrils daily as needed for allergies or rhinitis.   ibuprofen (ADVIL) 800 MG tablet Take by mouth.   lamoTRIgine (LAMICTAL) 200 MG tablet Take 1 tablet (200 mg total) by mouth daily.   omeprazole (PRILOSEC) 40 MG capsule Take 1-2 capsules (40-80 mg total) by mouth daily.   polyethylene glycol powder (GLYCOLAX/MIRALAX) 17 GM/SCOOP powder Take 17 g by mouth 2 (two) times daily as needed.   Travoprost, BAK Free, (TRAVATAN) 0.004 % SOLN ophthalmic solution Place 1 drop into both eyes at bedtime.   zolpidem (AMBIEN) 5 MG tablet Take 1 tablet (5 mg total) by mouth at bedtime as needed for sleep.   No facility-administered encounter medications on file as of 10/19/2020.    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   Post-concussion headache 01/20/2018   Iron deficiency anemia 10/05/2017   Carotid stenosis 11/17/2016   GERD (gastroesophageal reflux disease) 11/17/2016   Essential hypertension 11/17/2016   Hyperlipidemia 11/17/2016   Status post total shoulder replacement, 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   Rotator cuff tendinitis, left 05/20/2016   History of TIAs 07/17/2015   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   TIA (transient ischemic attack) 12/23/2014   Localized, 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   Disorder of bone and articular cartilage 09/29/2014    Conditions to be addressed/monitored: Bipolar Disorder and Insomnia ; Mental Health Concerns   Care Plan : General Social Work (Adult)  Updates made by Rebekah Chesterfield, LCSW since 10/19/2020 12:00 AM     Problem: Quality of Life (General Plan of Care)      Long-Range Goal: Quality of Life Maintained   Start Date: 06/22/2020  This Visit's Progress: On track  Priority: Medium  Note:   Timeframe:  Long-Range Goal Priority:  Medium  Start Date:  06/22/20                         Expected End Date:  09/19/20                    Follow Up Date- 01/04/21  Current Barriers:  Chronic Mental Health needs related to bipolar, depression and anxiety Limited social support Transportation ADL IADL limitations Mental Health Concerns  Social Isolation Memory Deficits Inability to perform ADL's independently Inability to perform IADL's independently Suicidal Ideation/Homicidal Ideation: No  Clinical Social Work Goal(s):  Over the next 120 days, patient/caregiver will work with SW to address concerns related to gaining additional support/resource connection in order to maintain health and mental health appropriately  Over the next 120 days, patient will demonstrate improved  adherence to self care as evidenced by implementing healthy self-care into her daily routine such as: attending all medical appointments, contacting mental health resources that were emailed, deep breathing exercises, taking time for self-reflection, taking medications as prescribed, drinking water and daily exercise to improve mobility.  Over the next 120 days, patient will work with SW  and therapist bi-monthly by telephone or in person to reduce or manage symptoms related to anxiety and depression.  Over the next 120 days, patient will demonstrate improved health management independence as evidenced by implementing healthy self-care and positive support/resources into her daily routine to cope with stressors/mental health symptoms and improve overall health and well-being   Interventions: Patient interviewed and appropriate assessments performed: brief mental health assessment Patient interviewed and appropriate assessments performed Patient reports that anxiety and depression symptoms have decreased significantly with the compliance of medication management. Her stomach symptoms have also resolved Patient reports that she has difficulty managing recent onset of insomnia. Medication was prescribed to assist with symptoms. Per patient, Ambien provided relief of symptoms for two days. Since then symptoms have persisted and patient obtains 1-2 hours of sleep daily. Patient has no energy and reports "  this is taking a toll on me" CCM LCSW discussed strategies to assist with improving sleep hygiene. Patient reports that she has tried all of suggestions discussed. She has tried limiting electronics, utilizing lavender scents/oils, listening to music, and completing word searches to promote relaxation CCM LCSW provided Psychoeducation about how tobacco use can negatively impact sleep Patient has knowledge of today's follow-up appointment with PCP. She has stable transportation and no additional psychosocial  stressors or resource needs  Discussed plans with patient for ongoing care management follow up and provided patient with direct contact information for care management team Advised patient to contact CCM LCSW for any future social work related needs.    Patient Self Care Activities:  Attend all scheduled provider appointments Call provider office for new concerns or questions Continue compliance with med management Continue utilization of healthy coping skills       Christa See, MSW, [email or web address] Phone [phone] 2:00 PM

## 2020-10-20 ENCOUNTER — Encounter: Payer: Self-pay | Admitting: Nurse Practitioner

## 2020-10-20 NOTE — Assessment & Plan Note (Signed)
Ongoing.  Not controlled. Will try Nortryptiline nightly to see if sleep improves.  Discussed side effects and benefits of medication with patient during visit. Follow up in 1 month.

## 2020-10-23 ENCOUNTER — Telehealth: Payer: Self-pay

## 2020-10-23 NOTE — Telephone Encounter (Signed)
Please tell patient to take two tablets to see if this improves her sleep.

## 2020-10-23 NOTE — Telephone Encounter (Signed)
Attempted to call patient no answer left voicemail with provider advise.

## 2020-10-23 NOTE — Telephone Encounter (Signed)
Copied from [place] [id number]. Topic: General - Other >> Oct 23, 2020 10:32 AM Tessa Lerner A wrote: Reason for CRM: Patient has called to give an update on their medication, as directed by PCP  The patient shares that their nortriptyline (PAMELOR) 10 MG capsule prescription is not working and they are feeling no relief  Please contact to further advise when possible

## 2020-10-26 ENCOUNTER — Ambulatory Visit: Payer: Self-pay | Admitting: Licensed Clinical Social Worker

## 2020-10-26 DIAGNOSIS — F319 Bipolar disorder, unspecified: Secondary | ICD-10-CM

## 2020-10-26 DIAGNOSIS — F419 Anxiety disorder, unspecified: Secondary | ICD-10-CM

## 2020-10-26 DIAGNOSIS — I10 Essential (primary) hypertension: Secondary | ICD-10-CM

## 2020-10-30 NOTE — Chronic Care Management (AMB) (Signed)
Chronic Care Management    Clinical Social Work Note  10/30/2020 Name: Sydney Evans MRN: [id number] DOB: Jun 23, 1950  Sydney Evans is a 70 y.o. year old female who is a primary care patient of Jon Billings, NP. The CCM team was consulted to assist the patient with chronic disease management and/or care coordination needs related to: Mental Health Counseling and Resources and Grief Counseling.   Engaged with patient by telephone for follow up visit in response to provider referral for social work chronic care management and care coordination services.   Consent to Services:  The patient was given information about Chronic Care Management services, agreed to services, and gave verbal consent prior to initiation of services.  Please see initial visit note for detailed documentation.   Patient agreed to services and consent obtained.   Assessment: Patient is currently experiencing difficulty with managing anxiety symptoms triggered by stress and grief. This has negatively impacted patient's ability to sleep. Patient is requesting medication to take PRN to alleviate anxiety symptoms. Additional strategies discussed to process grief and manage stressors. See Care Plan below for interventions and patient self-care actives. Recent life changes [place]: Grief and managing pet  Recommendation: Patient may benefit from, and is in agreement to work with LCSW to address care coordination needs and will continue to work with the clinical team to address health care and disease management related needs.  Follow up Plan: Patient would like continued follow-up.  CCM LCSW will follow up with patient on 01/04/21. Patient will call office if needed prior to next encounter.  SDOH (Social Determinants of Health) assessments and interventions performed:    Advanced Directives Status: Not addressed in this encounter.  CCM Care Plan  Allergies  Allergen Reactions   Hydroxyzine Hives and Rash    Gabapentin     Dizzy and confusion   Dilaudid [Hydromorphone] Itching   Hydrocodone Hives and Rash    "terrible scratching"  Make take with benadryl   Ketorolac Rash     May take with benadryl   Percocet [Oxycodone-Acetaminophen] Itching and Rash    May take with benadryl     Toradol [Ketorolac Tromethamine] Rash    May take with benadryl    Outpatient Encounter Medications as of 10/26/2020  Medication Sig   Carboxymethylcellulose Sodium (LUBRICANT EYE DROPS OP) Place 1 drop into both eyes daily as needed (Dry eye). Systane   citalopram (CELEXA) 40 MG tablet Take 1.5 tablets (60 mg total) by mouth daily.   diclofenac Sodium (VOLTAREN) 1 % GEL Apply 4 g topically 4 (four) times daily.   diphenhydrAMINE (BENADRYL) 25 MG tablet Take 25 mg by mouth every 6 (six) hours as needed for itching.   fluticasone (FLONASE) 50 MCG/ACT nasal spray Place 2 sprays into both nostrils daily as needed for allergies or rhinitis.   ibuprofen (ADVIL) 800 MG tablet Take by mouth.   lamoTRIgine (LAMICTAL) 200 MG tablet Take 1 tablet (200 mg total) by mouth daily.   nortriptyline (PAMELOR) 10 MG capsule Take 1 capsule (10 mg total) by mouth at bedtime.   omeprazole (PRILOSEC) 40 MG capsule Take 1-2 capsules (40-80 mg total) by mouth daily.   polyethylene glycol powder (GLYCOLAX/MIRALAX) 17 GM/SCOOP powder Take 17 g by mouth 2 (two) times daily as needed.   Travoprost, BAK Free, (TRAVATAN) 0.004 % SOLN ophthalmic solution Place 1 drop into both eyes at bedtime.   No facility-administered encounter medications on file as of 10/26/2020.    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   Post-concussion headache 01/20/2018   Iron deficiency anemia 10/05/2017   Carotid stenosis 11/17/2016   GERD (gastroesophageal reflux disease) 11/17/2016   Essential hypertension 11/17/2016   Hyperlipidemia 11/17/2016   Status post total shoulder replacement, 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   Rotator cuff tendinitis, left 05/20/2016   History of TIAs 07/17/2015   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   TIA (transient ischemic attack) 12/23/2014   Localized, 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   Disorder of bone and articular cartilage 09/29/2014    Conditions to be addressed/monitored: Anxiety; Mental Health Concerns   Care Plan : General Social Work (Adult)  Updates made by Rebekah Chesterfield, LCSW since 10/30/2020 12:00 AM     Problem: Quality of Life (General Plan of Care)      Long-Range Goal: Quality of Life Maintained   Start Date: 06/22/2020  This Visit's Progress: On track  Recent Progress: On track  Priority: Medium  Note:   Timeframe:  Long-Range Goal Priority:  Medium  Start Date:  06/22/20                         Expected End Date:  09/19/20                    Follow Up Date- 01/04/21  Current Barriers:  Chronic Mental Health needs related to bipolar, depression and anxiety Limited social support Transportation ADL IADL limitations Mental Health Concerns  Social Isolation Memory Deficits Inability to perform ADL's independently Inability to perform IADL's independently Suicidal Ideation/Homicidal Ideation: No  Clinical Social Work Goal(s):  Over the next 120 days, patient/caregiver will work with SW to address concerns related to gaining additional support/resource connection in order to maintain health and mental health appropriately  Over the next 120 days, patient will demonstrate improved  adherence to self care as evidenced by implementing healthy self-care into her daily routine such as: attending all medical appointments, contacting mental health resources that were emailed, deep breathing exercises, taking time for self-reflection, taking medications as prescribed, drinking water and daily exercise to improve mobility.  Over the next 120 days, patient will work with SW  and therapist bi-monthly by telephone or in person to reduce or manage symptoms related to anxiety and depression.  Over the next 120 days, patient will demonstrate improved health management independence as evidenced by implementing healthy self-care and positive support/resources into her daily routine to cope with stressors/mental health symptoms and improve overall health and well-being   Interventions: Patient interviewed and appropriate assessments performed: brief mental health assessment Patient interviewed and appropriate assessments performed Patient reports medication to assist with sleep prescribed by PCP aren't working. States PCP advised her to increase dosage to two; however, it has not been effective in two days. Patient continues to practice deep breathing exercises and utilizing lavender oils to promote relaxation Pt reports that anxiety symptoms have increased. She shared, "years ago when my son joined the army, my anxiety was really high. I was prescribed  Valium and that worked well" Patient reports compliance with celexa. Patient is requesting a PRN medication to assist with anxiety symptoms CCM LCSW discussed triggers to anxiety. Patient reports an increase in stress with attempting to train her dog who pulls while being walked, which has resulted in patient falling. She also runs away when she goes out at times. Patient is scared to take Ruby for a walk due to vertigo Patient had a service dog that passed away last year in Jun 01, 2022. Since her passing, patient has been feeling unsafe because her pet  of 14 years, Casimer Bilis, was trained to assist in the case of crisis.  Patient has received support with neighbor, who makes attempts to help train dog.  Discussed safety plan in case of crisis. Has phone stored nearby and patient's aid visit's seven days a week for approx. 2.5 hours daily (assists with meds and transportation)  CCM LCSW discussed strategies to process grief. Patient bought an item for [place] that changes colors and sizes. She placed it on tree outside and it provides comfort when she sees it throughout the day Discussed plans with patient for ongoing care management follow up and provided patient with direct contact information for care management team Advised patient to contact CCM LCSW for any future social work related needs.  Patient Self Care Activities:  Attend all scheduled provider appointments Call provider office for new concerns or questions Continue compliance with med management Continue utilization of healthy coping skills      Christa See, MSW, [email or web address] Phone [phone] 9:00 AM

## 2020-10-30 NOTE — Patient Instructions (Signed)
Visit Information   Goals Addressed             This Visit's Progress    Management of MH symptoms   On track    Patient Self Care Activities:  Attend all scheduled provider appointments Call provider office for new concerns or questions Continue compliance with med management Continue utilization of healthy coping skills         Patient verbalizes understanding of instructions provided today and agrees to view in [place].   Telephone follow up appointment with care management team member scheduled for:01/04/21  Christa See, MSW, [email or web address] Phone [phone] 9:01 AM

## 2020-11-02 ENCOUNTER — Telehealth: Payer: Self-pay | Admitting: Nurse Practitioner

## 2020-11-02 DIAGNOSIS — G479 Sleep disorder, unspecified: Secondary | ICD-10-CM

## 2020-11-02 MED ORDER — NORTRIPTYLINE HCL 25 MG PO CAPS: 25.0000 mg | ORAL_CAPSULE | Freq: Every day | ORAL | 1 refills | Status: DC

## 2020-11-02 NOTE — Telephone Encounter (Signed)
Medication sent to the pharmacy. Let her know I increased the dose to 25mg  so shed only have to take 1.  There is not a 20mg  tab.

## 2020-11-02 NOTE — Telephone Encounter (Signed)
Patient states 10/25/2020 nortriptyline (PAMELOR) 10 MG capsule needs to reflect 2 times at bedtime. Patient states PCP is aware.    [place], [place] - [place] Phone:  [phone]  Fax:  [phone]

## 2020-11-03 NOTE — Telephone Encounter (Signed)
Left detailed message informing patient of Karen's recommendation and advised patient to give our office a call should she have any questions.

## 2020-11-20 ENCOUNTER — Encounter: Payer: Self-pay | Admitting: Nurse Practitioner

## 2020-11-20 ENCOUNTER — Other Ambulatory Visit: Payer: Self-pay

## 2020-11-20 ENCOUNTER — Ambulatory Visit (INDEPENDENT_AMBULATORY_CARE_PROVIDER_SITE_OTHER): Payer: Medicare Other | Admitting: Nurse Practitioner

## 2020-11-20 VITALS — BP 169/79 | HR 62 | Temp 98.5°F

## 2020-11-20 DIAGNOSIS — G479 Sleep disorder, unspecified: Secondary | ICD-10-CM

## 2020-11-20 DIAGNOSIS — F419 Anxiety disorder, unspecified: Secondary | ICD-10-CM

## 2020-11-20 DIAGNOSIS — F319 Bipolar disorder, unspecified: Secondary | ICD-10-CM | POA: Diagnosis not present

## 2020-11-20 NOTE — Progress Notes (Signed)
BP (!) 169/79   Pulse 62   Temp 98.5 F (36.9 C)   SpO2 97%    Subjective:    Patient ID: Sydney Evans, female    DOB: 1951-03-05, 70 y.o.   MRN: [id number]  HPI: Sydney Evans is a 70 y.o. female  Chief Complaint  Patient presents with   Insomnia   Anxiety    Panic attacks, that causes increased bp and heart rate. She states that she has tried Psychologist, counselling with heat for her neck, which does not help.    SLEEP DISTURBANCE Patient states the nortriptyline was not helping her sleep.  Still not sleeping well.  She is now having anxiety attacks.  Patient states her blood pressure went up and her HR rate up to 110. Denies SI. Patient has not seen psychiatry in many years.  Has been dealing with this for awhile.     Relevant past medical, surgical, family and social history reviewed and updated as indicated. Interim medical history since our last visit reviewed. Allergies and medications reviewed and updated.  Review of Systems  Psychiatric/Behavioral:  Positive for sleep disturbance. The patient is nervous/anxious.    Per HPI unless specifically indicated above     Objective:    BP (!) 169/79   Pulse 62   Temp 98.5 F (36.9 C)   SpO2 97%   Wt Readings from Last 3 Encounters:  09/18/20 147 lb 12.8 oz (67 kg)  05/12/20 142 lb (64.4 kg)  03/17/20 143 lb 3.2 oz (65 kg)    Physical Exam Vitals and nursing note reviewed.  Constitutional:      General: She is not in acute distress.    Appearance: Normal appearance. She is normal weight. She is not ill-appearing, toxic-appearing or diaphoretic.  HENT:     Head: Normocephalic.     Right Ear: External ear normal.     Left Ear: External ear normal.     Nose: Nose normal.     Mouth/Throat:     Mouth: Mucous membranes are moist.     Pharynx: Oropharynx is clear.  Eyes:     General:        Right eye: No discharge.        Left eye: No discharge.     Extraocular Movements: Extraocular movements  intact.     Conjunctiva/sclera: Conjunctivae normal.     Pupils: Pupils are equal, round, and reactive to light.  Cardiovascular:     Rate and Rhythm: Normal rate and regular rhythm.     Heart sounds: No murmur heard. Pulmonary:     Effort: Pulmonary effort is normal. No respiratory distress.     Breath sounds: Normal breath sounds. No wheezing or rales.  Musculoskeletal:     Cervical back: Normal range of motion and neck supple.  Skin:    General: Skin is warm and dry.     Capillary Refill: Capillary refill takes less than 2 seconds.  Neurological:     General: No focal deficit present.     Mental Status: She is alert and oriented to person, place, and time. Mental status is at baseline.  Psychiatric:        Mood and Affect: Mood normal.        Behavior: Behavior normal.        Thought Content: Thought content normal.        Judgment: Judgment normal.    Results for orders placed or performed during the hospital encounter  of 10/11/20  I-STAT creatinine  Result Value Ref Range   Creatinine, Ser 0.80 0.44 - 1.00 mg/dL      Assessment & Plan:   Problem List Items Addressed This Visit       Other   Bipolar depression ([place]) - Primary    Chronic. Has been on Lamictal and Celexa for many years. Has not seen psychiatry. Will refer patient to psychiatry due to symptoms possibly being hypomania.  She has failed efforts to help sleep with trazodone, Ambien, and nortriptyline. She would benefit from a reduction in her Celexa dose due to age.  Will let Psychiatry weigh in. Discussed with patient that she will likely need her medications adjusted and will see improve in her sleep and quality of life. Patient agrees to see psychiatry.        Relevant Orders   Ambulatory referral to Psychiatry   Anxiety    Chronic.  Exacerbated. Referral placed for psychiatry.  Discussed with patient that she may need medications adjusted. She agrees to see psychiatry.        Relevant Orders    Ambulatory referral to Psychiatry   Sleep disturbance    Chronic. Patient referred to psychiatry due to bipolar and anxiety. Will let psychiatry address sleep concerns as well.  Patient understands and agrees with the plan.        Relevant Orders   Ambulatory referral to Psychiatry     Follow up plan: Return in about 2 months (around 01/21/2021) for sleep and anxiety.   A total of 30 minutes were spent on this encounter today.  When total time is documented, this includes both the face-to-face and non-face-to-face time personally spent before, during and after the visit on the date of the encounter reviewing hypomania and need for psychiatry referral.

## 2020-11-20 NOTE — Assessment & Plan Note (Addendum)
Chronic.  Exacerbated. Referral placed for psychiatry.  Discussed with patient that she may need medications adjusted. She agrees to see psychiatry.

## 2020-11-21 NOTE — Assessment & Plan Note (Signed)
Chronic. Has been on Lamictal and Celexa for many years. Has not seen psychiatry. Will refer patient to psychiatry due to symptoms possibly being hypomania.  She has failed efforts to help sleep with trazodone, Ambien, and nortriptyline. She would benefit from a reduction in her Celexa dose due to age.  Will let Psychiatry weigh in. Discussed with patient that she will likely need her medications adjusted and will see improve in her sleep and quality of life. Patient agrees to see psychiatry.

## 2020-11-21 NOTE — Assessment & Plan Note (Signed)
Chronic. Patient referred to psychiatry due to bipolar and anxiety. Will let psychiatry address sleep concerns as well.  Patient understands and agrees with the plan.

## 2020-11-29 ENCOUNTER — Telehealth: Payer: Self-pay

## 2020-12-06 ENCOUNTER — Ambulatory Visit (INDEPENDENT_AMBULATORY_CARE_PROVIDER_SITE_OTHER): Payer: Medicare Other | Admitting: General Practice

## 2020-12-06 ENCOUNTER — Telehealth: Payer: Self-pay | Admitting: General Practice

## 2020-12-06 DIAGNOSIS — I10 Essential (primary) hypertension: Secondary | ICD-10-CM

## 2020-12-06 DIAGNOSIS — R531 Weakness: Secondary | ICD-10-CM

## 2020-12-06 DIAGNOSIS — R112 Nausea with vomiting, unspecified: Secondary | ICD-10-CM

## 2020-12-06 DIAGNOSIS — W19XXXD Unspecified fall, subsequent encounter: Secondary | ICD-10-CM

## 2020-12-06 DIAGNOSIS — G479 Sleep disorder, unspecified: Secondary | ICD-10-CM

## 2020-12-06 DIAGNOSIS — R197 Diarrhea, unspecified: Secondary | ICD-10-CM

## 2020-12-06 DIAGNOSIS — F319 Bipolar disorder, unspecified: Secondary | ICD-10-CM

## 2020-12-06 DIAGNOSIS — F419 Anxiety disorder, unspecified: Secondary | ICD-10-CM

## 2020-12-06 NOTE — Chronic Care Management (AMB) (Signed)
Chronic Care Management   CCM RN Visit Note  12/06/2020 Name: Sydney Evans MRN: [id number] DOB: [birth year]  Subjective: Zuriyah Shatz is a 70 y.o. year old female who is a primary care patient of Jon Billings, NP. The care management team was consulted for assistance with disease management and care coordination needs.    Engaged with patient by telephone for follow up visit in response to provider referral for case management and/or care coordination services.   Consent to Services:  The patient was given information about Chronic Care Management services, agreed to services, and gave verbal consent prior to initiation of services.  Please see initial visit note for detailed documentation.   Patient agreed to services and verbal consent obtained.   Assessment: Review of patient past medical history, allergies, medications, health status, including review of consultants reports, laboratory and other test data, was performed as part of comprehensive evaluation and provision of chronic care management services.   SDOH (Social Determinants of Health) assessments and interventions performed:    CCM Care Plan  Allergies  Allergen Reactions   Hydroxyzine Hives and Rash   Gabapentin     Dizzy and confusion   Dilaudid [Hydromorphone] Itching   Hydrocodone Hives and Rash    "terrible scratching"  Make take with benadryl   Ketorolac Rash     May take with benadryl   Percocet [Oxycodone-Acetaminophen] Itching and Rash    May take with benadryl     Toradol [Ketorolac Tromethamine] Rash    May take with benadryl    Outpatient Encounter Medications as of 12/06/2020  Medication Sig   Carboxymethylcellulose Sodium (LUBRICANT EYE DROPS OP) Place 1 drop into both eyes daily as needed (Dry eye). Systane   citalopram (CELEXA) 40 MG tablet Take 1.5 tablets (60 mg total) by mouth daily.   diclofenac Sodium (VOLTAREN) 1 % GEL Apply 4 g topically 4 (four) times daily.   diphenhydrAMINE  (BENADRYL) 25 MG tablet Take 25 mg by mouth every 6 (six) hours as needed for itching.   fluticasone (FLONASE) 50 MCG/ACT nasal spray Place 2 sprays into both nostrils daily as needed for allergies or rhinitis.   ibuprofen (ADVIL) 800 MG tablet Take by mouth.   lamoTRIgine (LAMICTAL) 200 MG tablet Take 1 tablet (200 mg total) by mouth daily.   omeprazole (PRILOSEC) 40 MG capsule Take 1-2 capsules (40-80 mg total) by mouth daily.   polyethylene glycol powder (GLYCOLAX/MIRALAX) 17 GM/SCOOP powder Take 17 g by mouth 2 (two) times daily as needed.   Travoprost, BAK Free, (TRAVATAN) 0.004 % SOLN ophthalmic solution Place 1 drop into both eyes at bedtime.   No facility-administered encounter medications on file as of 12/06/2020.    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   Post-concussion headache 01/20/2018   Iron deficiency anemia 10/05/2017   Carotid stenosis 11/17/2016   GERD (gastroesophageal reflux disease) 11/17/2016   Essential hypertension 11/17/2016   Hyperlipidemia 11/17/2016   Status post total shoulder replacement, 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   Rotator cuff tendinitis, left 05/20/2016  History of TIAs 07/17/2015   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   TIA (transient ischemic attack) 12/23/2014   Localized, 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   Disorder of bone and articular cartilage 09/29/2014    Conditions to be addressed/monitored:HTN, Anxiety, Depression, Bipolar Disorder, and GI  issues, falls and safety   Care Plan : RNCM: Hypertension (Adult)  Updates made by Vanita Ingles since 12/06/2020 12:00 AM     Problem: RNCM: Hypertension (Hypertension)   Priority: Medium     Long-Range Goal: RNCM: Hypertension Monitored   Start Date: 06/07/2020  Expected End Date: 10/22/2021  This Visit's Progress: On track  Priority: Medium  Note:   Objective:  Last practice recorded BP readings:  BP Readings from Last 3 Encounters:  11/20/20 (!) 169/79  10/19/20 115/69  10/06/20 (!) 156/63     Most recent eGFR/CrCl: No results found for: EGFR  No components found for: CRCL Current Barriers:  Knowledge Deficits related to basic understanding of hypertension pathophysiology and self care management Knowledge Deficits related to understanding of medications prescribed for management of hypertension Lacks social connections Does not contact provider office for questions/concerns Does not take pharmacological interventions for blood pressure management  Case Manager Clinical Goal(s):  patient will verbalize understanding of plan for hypertension management patient will demonstrate improved adherence to prescribed treatment plan for hypertension as evidenced by taking all medications as prescribed, monitoring and recording blood pressure as directed, adhering to low sodium/DASH diet patient will demonstrate improved health management independence as evidenced by checking blood pressure as directed and notifying PCP if SBP>160 or DBP > 90, taking all medications as prescribe, and adhering to a low sodium diet as discussed. Interventions:  Collaboration with Jon Billings, NP regarding development and update of comprehensive plan of care as evidenced by provider attestation and co-signature Inter-disciplinary care team collaboration (see longitudinal plan of care) Evaluation of current treatment plan related to hypertension self management and patient's adherence to plan as  established by provider. 09-27-2020: The patient states that her pressure was high in the office last week due to her not feeling well and she told the MD it would be elevated. The patient states it varies at home and is dependent on how she feels on a given day. She states that she was having a really bad day that day with not feeling well and her anxiety was off the "charts".  She is feeling much better today. 12-06-2020: The patient states that she has "white coat syndrome" and she has been having a lot of panic attacks recently. The patient states that she is doing better at home but is recently having some episodes of "feeling like I am going to pass out". Education on orthostatic hypotension. The patient is going to start taking her blood pressures when she feels like this and record.  Provided education to patient re: stroke prevention, s/s of heart attack and stroke, DASH diet, complications of uncontrolled blood pressure. 09-27-2020: The patient states that she has had a stroke in the past and she is very mindful of her dietary restrictions and keeping track of her blood pressures. She takes her blood pressures several times a day. 12-06-2020: The patient is doing well and denies any issues with dietary restrictions. Is concerned about her blood pressures and the feeling of like she is passing out. Explained what orthostatic hypotension was and to change position slowly from a lying to a sitting, sitting to standing. Eduction that this may be the  reason she feels like she is going to pass out. The patient verbalized understanding.  Reviewed medications with patient and discussed importance of compliance. 12-06-2020: Does not take any medications for blood pressure control but is compliant with her medications regimen.  Discussed plans with patient for ongoing care management follow up and provided patient with direct contact information for care management team Advised patient, providing education and  rationale, to monitor blood pressure daily and record, calling PCP for findings outside established parameters. The patient states she has been taking her blood pressure and writing down the values but she did not have her book near at the time of the call. Will continue to monitor. 09-27-2020: The patient gave the RNCM several blood pressure readings. The readings are recorded in VS table. Range is 161-096 systolic and 70 to 85 diastolic. Denies any headaches or other concerns with blood pressures. States HTN runs in her family. Praised the patient for keeping record of her readings. 12-06-2020: The patient states her pressures have been up and down but stable. The patient is going to start taking her blood pressures when she feels light headed or dizzy. Education about safety.  Patient Goals/Self-Care Activities Over the next 120 days, patient will:  - Self administers medications as prescribed Attends all scheduled provider appointments Calls provider office for new concerns, questions, or BP outside discussed parameters Checks BP and records as discussed-09-27-2020: Had her log book and provided several readings for the [facility] today. 12-06-2020: Continues to check at home about 3 times a day and records. Will continue to monitor.  Follows a low sodium diet/DASH diet- 12-06-2020: Stays away from fried foods.  - blood pressure trends reviewed - depression screen reviewed - home or ambulatory blood pressure monitoring encouraged  Follow Up Plan: Telephone follow up appointment with care management team member scheduled for: 02-07-2021 at 0945 am    Task: RNCM: Identify and Monitor Blood Pressure Elevation Completed 12/06/2020  Outcome: Positive  Note:   Care Management Activities:    - blood pressure trends reviewed - depression screen reviewed - home or ambulatory blood pressure monitoring encouraged      Care Plan : RNCM: Anxiety, bipolar Depression (Adult)  Updates made by Vanita Ingles since  12/06/2020 12:00 AM     Problem: RNCM: Anxiety, and Bipolar Depression Identification (Depression)   Priority: Medium     Long-Range Goal: RNCM: Depressive Symptoms Identified   Start Date: 06/07/2020  Expected End Date: 10/22/2021  This Visit's Progress: On track  Priority: High  Note:   Current Barriers:  Chronic Disease Management support and education needs related to effective management of depression and anxiety  Unable to independently manage anxiety and depression  Does not contact provider office for questions/concerns In need of a service dog but can not pay the high prices associated with training or purchasing a service dog  Nurse Case Manager Clinical Goal(s):  patient will verbalize understanding of plan for effective management of depression and anxiety   patient will work with RNCM, and pcp  to address needs related to changes in mood/anxiety/depression   patient will demonstrate a decrease in depressive mood exacerbations as evidenced by normalized and baseline status of mood and mentation.   Interventions:  1:1 collaboration with Jon Billings, NP regarding development and update of comprehensive plan of care as evidenced by provider attestation and co-signature Inter-disciplinary care team collaboration (see longitudinal plan of care) Evaluation of current treatment plan related to bipolar depression and anxiety  and  patient's adherence to plan as established by provider. 09-27-2020: The patient states she is having a better day today and her anxiety level is not as high as it was in the office last week. She feels she would benefit from a service dog to help with her anxiety and other health problems. She had a service dog for 14 years but her dog passed away last year. She has discussed with the insurance and if the provider fills out paperwork she can get help from the insurance with the cost. It cost 13000.00 to have an animal trained and [phone] to purchase a  service dog already trained.  A care guide referral has been placed for resources to help with a service dog. Also will do independent research to see if there are any resources available for cost constraints of getting a service dog for elderly patients. 12-06-2020: The patient states that she is having more panic attacks and it is because her dog is not trained properly to help her. She is trying several things but her panic attacks are random. Review of what she does when she has a panic attack. Emotional support and education given. Did recommend she try journal her thoughts. She did not understand why the psychiatry referral and she has not heard from them. Education provided to the patient as to how pcp is limited and psychiatry could adjust medications better to help her. The patient verbalized understanding. Will reach out to the referral scheduler and see if can follow up on the referral for psychiatry as she has not been contacted for an appointment. The LCSW is also involved in her care.  Advised patient to call the office for changes in mood/depression/anxiety Provided education to patient re: effective management of depression and anxiety and the ability to send information to the patient through the my chart functionality. 12-06-2020: Review and recommendations given for alternate activities. The patient does use aromatherapy to help with relaxation and mindfulness. The patient is open to ideas and suggestions.  Discussed plans with patient for ongoing care management follow up and provided patient with direct contact information for care management team Text message sent to the patient for information on [email or web address]  Patient Goals/Self-Care Activities Over the next 120  days, patient will:  - Patient will self administer medications as prescribed Patient will attend all scheduled provider appointments Patient will call pharmacy for medication refills Patient will attend church or other social  activities Patient will continue to perform ADL's independently Patient will continue to perform IADL's independently Patient will call provider office for new concerns or questions Patient will work with BSW to address care coordination needs and will continue to work with the clinical team to address health care and disease management related needs.   - anxiety screen reviewed - depression screen reviewed  Follow Up Plan: Telephone follow up appointment with care management team member scheduled for: 02-07-2021 at 0945am        Task: RNCM: Identify Depressive Symptoms and Facilitate Treatment Completed 12/06/2020  Outcome: Positive  Note:   Care Management Activities:    - anxiety screen reviewed - depression screen reviewed        Care Plan : RNCM: Management of GI symptoms: Chronic Nausea/Diarrhea  Updates made by Vanita Ingles since 12/06/2020 12:00 AM     Problem: RNCM: Management of GI Issues: Diarrhea/Nausea/Constipation   Priority: High     Long-Range Goal: RNCM: Diarrhea/Nausea/Constipation  Managed   Start Date: 06/07/2020  Expected End Date:  12/01/2021  This Visit's Progress: On track  Priority: High  Note:   Current Barriers:  Knowledge Deficits related to etiology of bacteria in her small intestines and why the abx is not getting rid of the bacteria  Chronic Disease Management support and education needs related to effective management of GI flare ups/chronic nausea/diarrhea and sometimes constipation Lacks caregiver support.  Unable to independently manage chronic gi symptoms  Unable to self administer medications as prescribed Lacks social connections Does not contact provider office for questions/concerns  Nurse Case Manager Clinical Goal(s):  patient will verbalize understanding of plan for effective management of GI issues  patient will work with [place], and specialist  to address needs related to Chronic nausea, diarrhea, and constipation  patient  will demonstrate a decrease in nausea and GI symptom  exacerbations as evidenced by no evidence of nausea, bowel habits within normal limits, and tolerating po intake without difficulty  Interventions:  1:1 collaboration with Jon Billings, NP regarding development and update of comprehensive plan of care as evidenced by provider attestation and co-signature Inter-disciplinary care team collaboration (see longitudinal plan of care) Evaluation of current treatment plan related to Nausea/diarrhea/constipation  and patient's adherence to plan as established by provider. 09-27-2020: The patient saw the pcp last week. Was not having a good week with her GI symptoms. The patient is having a better week. States that she is "experimenting" and started using a half a cap full of Miralax in her coffee each morning. That was working well so she moved to a full cap full of Miralax. This is working well for her. She is trying to get an earlier appointment with the GI specialist. Currently the first available appointment is 11-29-2020. She would like the RNCM to let the pcp know the Miralax is working good right now. 12-06-2020: The patient is doing well with her GI symptoms at this time. States she is not having flare ups at this time. Will continue to monitor.  Advised patient to write down questions to ask the GI specialist. The patient states she is calling the GI provider tomorrow. She was on the high potency ABX for the bacteria found in her small intestines and each time she gets near the dose of the ABX she starts having the bad symptoms she was having. She is currently on her second round of ABX therapy. She wants to know why she is still having the symptoms and why the medication is not taking care of the diarrhea. Empathetic listening and support given. 09-27-2020: The patient niece is  helping her with getting appointments with the GI specialist. The patient has an appointment for 11-29-2020 but is hopeful to get  a sooner appointment. 12-06-2020: The patient is working with the specialist to manage her GI symptoms. Will continue to work with the patient for exacerbations, education, and support.  Provided education to patient re: discussing concerns with the GI Specialist and the ability for the [facility] to provide educational material by my chart system and EMMI Reviewed medications with patient and discussed : compliance and the use of Xifanxin 550 mg TID for 2 weeks. The patient states she has been on 2 rounds of the medication and symptoms improve until she ends therapy. She wants to talk to the provider about what is causing the bacteria and why the medication is not getting rid of it. 09-27-2020: The patient is using Miralax and that is currently helping her symptoms. 12-06-2020: The patient is compliant with medications at this  time. No issues noted.  Discussed plans with patient for ongoing care management follow up and provided patient with direct contact information for care management team  Patient Goals/Self-Care Activities Over the next 120 days, patient will:  - Patient will self administer medications as prescribed Patient will attend all scheduled provider appointments Patient will call pharmacy for medication refills Patient will attend church or other social activities Patient will continue to perform ADL's independently Patient will continue to perform IADL's independently Patient will call provider office for new concerns or questions Patient will work with BSW to address care coordination needs and will continue to work with the clinical team to address health care and disease management related needs.   - diet adjustment recommended - fluid status assessed and trended - medication side effects managed - response to pharmacologic therapy monitored - weight assessed and trended  Follow Up Plan: Telephone follow up appointment with care management team member scheduled for: 02-07-2021 at  0945am       Task: RNCM: Alleviate Barriers to Diarrhea Management Completed 12/06/2020  Outcome: Positive  Note:   Care Management Activities:    - diet adjustment recommended - fluid status assessed and trended - medication side effects managed - response to pharmacologic therapy monitored - weight assessed and trended        Care Plan : RNCM: Fall Risk (Adult)  Updates made by Vanita Ingles since 12/06/2020 12:00 AM     Problem: RNCM: Fall Risk   Priority: High     Long-Range Goal: RNCM: Absence of Fall and Fall-Related Injury   Start Date: 06/07/2020  Expected End Date: 12/01/2021  This Visit's Progress: On track  Priority: High  Note:   Current Barriers:  Knowledge Deficits related to fall precautions in patient with anxiety, high risk for falls and other chronic conditions impacting patients health and well being.  Decreased adherence to prescribed treatment for fall prevention Unable to independently manage falls prevention as evident of a fall on 09/25/2020 and a fall before pcp visit on 09-18-2020. 12-06-2020: Denies any new falls will continue to monitor Lacks social connections Unable to perform IADLs independently Does not maintain contact with provider office Knowledge Deficits related to resources available for service animal help and safety devices to help with fall prevention and safety  Care Coordination needs related to asking for help with obtaining resources for a service dog  in a patient with frequent falls, anxiety, and multiple chronic conditions impacting her health and well being.  Chronic Disease Management support and education needs related to anxiety, depression, falls, and other chronic conditions warranting increased management of fall prevention and safety  Lacks caregiver support.  Film/video editor.  Clinical Goal(s):  patient will demonstrate improved adherence to prescribed treatment plan for decreasing falls as evidenced by patient  reporting and review of EMR patient will verbalize using fall risk reduction strategies discussed patient will not experience additional falls patient will verbalize understanding of plan for effective management of falls prevention and safety  patient will work with [facility], CCM team, and pcp to address needs related to fall prevention and safety patient will demonstrate a decrease in fall exacerbations as evidenced by no new falls- last fall 09-25-2020- denies injury or issues related to fall. 12-06-2020: Denies any new falls at this time. Will continue to monitor.  patient will attend all scheduled medical appointments: 01-24-2021 at 340 pm Interventions:  Collaboration with Jon Billings, NP regarding development and update of comprehensive plan of care as  evidenced by provider attestation and co-signature Inter-disciplinary care team collaboration (see longitudinal plan of care) Provided written and verbal education re: Potential causes of falls and Fall prevention strategies Reviewed medications and discussed potential side effects of medications such as dizziness and frequent urination Assessed for s/s of orthostatic hypotension Assessed for falls since last encounter. 12-06-2020: Denies any new falls at this time Assessed patients knowledge of fall risk prevention secondary to previously provided education. Assessed working status of life alert bracelet and patient adherence Provided patient information for fall alert systems Evaluation of current treatment plan related to fall prevention and safety  and patient's adherence to plan as established by provider. 12-06-2020: Review of safety in the home. The patient is remaining safe and denies any new falls. Will continue to monitor.  Advised patient to call the office for new falls and seek emergent care for falls with injuries  Provided education to patient re: fall prevention and safety in her surroundings, also to call [facility] customer service  number and inquire about the [place] life line system available through her insurance provider.  Reviewed medications with patient and discussed compliance and any medications that may cause increase risk for falls. Provided patient with falls prevention and safety  educational materials related to preventing falls in her home and surroundings  Reviewed scheduled/upcoming provider appointments including: 01-24-2021 at 340pm Care Guide referral for help with resources for a service dog Discussed plans with patient for ongoing care management follow up and provided patient with direct contact information for care management team Self-Care Deficits:  Unable to independently manage falls  Lacks social connections Unable to perform IADLs independently Does not maintain contact with provider office Patient Goals:  - Utilize walker (assistive device) appropriately with all ambulation - De-clutter walkways - Change positions slowly - Wear secure fitting shoes at all times with ambulation - Utilize home lighting for dim lit areas - Demonstrate self and pet awareness at all times - activities of daily living skills assessed - assistive or adaptive device use encouraged - barriers to physical activity or exercise addressed - barriers to physical activity or exercise identified - barriers to safety identified - cognition assessed - cognitive-stimulating activities promoted - fall prevention plan reviewed and updated - fear of falling, loss of independence and pain acknowledged - medication list reviewed - modification of home and work environment promoted Follow Up Plan: Telephone follow up appointment with care management team member scheduled for: 02-07-2021 at 0945 am         Plan:Telephone follow up appointment with care management team member scheduled for:  02-07-2021 at [place] am  Noreene Larsson RN, MSN, [facility] Mobile: [phone]

## 2020-12-06 NOTE — Patient Instructions (Signed)
Visit Information  PATIENT GOALS:  Goals Addressed               This Visit's Progress     COMPLETED: "I get depressed often now because I can't do for myself." (pt-stated)        Current Barriers:  Chronic Mental Health needs related to bipolar, depression and anxiety Limited social support Transportation ADL IADL limitations Mental Health Concerns  Social Isolation Memory Deficits Inability to perform ADL's independently Inability to perform IADL's independently Suicidal Ideation/Homicidal Ideation: No  Clinical Social Work Goal(s):  Over the next 120 days, patient/caregiver will work with SW to address concerns related to gaining additional support/resource connection in order to maintain health and mental health appropriately  Over the next 120 days, patient will demonstrate improved adherence to self care as evidenced by implementing healthy self-care into her daily routine such as: attending all medical appointments, contacting mental health resources that were emailed, deep breathing exercises, taking time for self-reflection, taking medications as prescribed, drinking water and daily exercise to improve mobility.  Over the next 120 days, patient will work with SW  and therapist bi-monthly by telephone or in person to reduce or manage symptoms related to anxiety and depression.  Over the next 120 days, patient will demonstrate improved health management independence as evidenced by implementing healthy self-care and positive support/resources into her daily routine to cope with stressors/mental health symptoms and improve overall health and well-being   Interventions: Patient interviewed and appropriate assessments performed: brief mental health assessment LCSW spoke with both patient and caregiver (niece) during last outreach. Patient recently changed primary care providers and is in need of mental health support implementation. Patient lives alone but has niece come check on  her multiple times per day. Niece or grandchildren will sometimes stay the night with patient if needed. Patient's daughter have not been involved with her care which has upset patient.  Patient complaints of ongoing muscle weakness. Patient has had recent falls. Patient is currently receiving PT in the home. Patient lost her service dog in January of this year and is unable to take appropriate care of their new service dog that is in training so patient's niece has agreed to take in this new service dog in and allow it to visit patient during the day to help lift patient's mood. Patient interviewed and appropriate assessments performed Provided mental health counseling with regard to *depression and anxiety management.  Email sent to niece with available mental health resource list. Education provided.  Provided patient with information about available socialization opportunities within patient's nearby area. Email sent to niece with information on these [facility]. Patient reports receiving socialization through her neighbor that comes over daily and by going on outings with her niece.  Discussed plans with patient for ongoing care management follow up and provided patient with direct contact information for care management team Advised patient to check email and to contact CCM LCSW for any future social work related needs.  Assisted patient/caregiver with obtaining information about health plan benefits Provided education and assistance to client regarding Advanced Directives. Provided education to patient/caregiver about Hospice and/or Palliative Care services Referred patient to [facility] (mental health provider) for long term follow up and therapy/counseling Brief CBTprovided during session today.  UPDATE- [name] reports that patient is currently receiving PT and she is currently running errands for patient. She reports that she went by [facility] to ask for PCP to make a referral to a  different  GI provider because they are unable to wait until the end of September. Niece wondered if LCSW could inquire about this with PCP. LCSW will sent in basket message. [name] reports that she is looking into hiring in home care and has been in communication with [facility]. She reports that they dropped off paperwork for PCP to sign. LCSW will also inquire with PCP about this as there are no notes in chart regarding this request. [name] is requesting additional assistance and support due to patent's worsening health conditions. Patient had a recent ED visit and has been experiencing dry heaving after taking medication. She has been experiencing weakness, fatigue and diarrhea as well and reported today "I am tired of feeling so bad" to niece. LCSW will update referral for CCM RNCM.  Patient Self Care Activities:  Attends all scheduled provider appointments Calls provider office for new concerns or questions Ability for insight Motivation for treatment Strong family or social support  Patient Coping Strengths:  Supportive Relationships Hopefulness Self Advocate Able to Communicate Effectively  Patient Self Care Deficits:  Lacks social connections Unable to perform ADLs independently Unable to perform IADLs independently  Please see past updates related to this goal by clicking on the "Past Updates" button in the selected goal        RNCM: Prevent Falls and Injury        Follow Up Date: 02-07-2021   - add more outdoor lighting - always use handrails on the stairs - always wear low-heeled or flat shoes or slippers with nonskid soles - call the doctor if I am feeling too drowsy - install bathroom grab bars - join an exercise group in my community - keep a flashlight by the bed - keep my cell phone with me always - learn how to get back up if I fall - make an emergency alert plan in case I fall - pick up clutter from the floors - use a nonslip pad with throw rugs, or remove  them completely - use a cane or walker - use a nightlight in the bathroom    Why is this important?   Most falls happen when it is hard for you to walk safely. Your balance may be off because of an illness. You may have pain in your knees, hip or other joints.  You may be overly tired or taking medicines that make you sleepy. You may not be able to see or hear clearly.  Falls can lead to broken bones, bruises or other injuries.  There are things you can do to help prevent falling.     Notes: Last reported fall on 09-25-2020. 12-06-2020: The patient denies any falls. The patient is remaining safe.       RNCM: Track and Manage My Symptoms-Depression        Timeframe:  Long-Range Goal Priority:  High Start Date:   12-06-2020                          Expected End Date:      12-06-2021                 Follow Up Date 02/07/2021    - avoid negative self-talk - develop a personal safety plan - develop a plan to deal with triggers like holidays, anniversaries - exercise at least 2 to 3 times per week - have a plan for how to handle bad days - journal feelings and what helps  to feel better or worse - spend time or talk with others at least 2 to 3 times per week - spend time or talk with others every day - watch for early signs of feeling worse - write in journal every day    Why is this important?   Keeping track of your progress will help your treatment team find the right mix of medicine and therapy for you.  Write in your journal every day.  Day-to-day changes in depression symptoms are normal. It may be more helpful to check your progress at the end of each week instead of every day.     Notes: 12-06-2020: The patient is having issues with insomnia and panic attacks. Biggest factor to her panic attacks is not having her service dog to help her. She cannot afford the 13000.00 to train her current dog. Her other service dog of 14 years passed away last year. She is doing multiple things to help.  Has a referral to psychiatry but has not heard from them yet. Will check with the scheduler. Will also check with resources for help with service animals for recommendations.         Patient verbalizes understanding of instructions provided today and agrees to view in [place].   Telephone follow up appointment with care management team member scheduled for: 02-07-2021 at [place] am  Noreene Larsson RN, MSN, [facility] Mobile: [phone]  Insomnia Insomnia is a sleep disorder that makes it difficult to fall asleep or stay asleep. Insomnia can cause fatigue, low energy, difficulty concentrating, moodswings, and poor performance at work or school. There are three different ways to classify insomnia: Difficulty falling asleep. Difficulty staying asleep. Waking up too early in the morning. Any type of insomnia can be long-term (chronic) or short-term (acute). Both are common. Short-term insomnia usually lasts for three months or less. Chronic insomnia occurs at least three times a week for longer than threemonths. What are the causes? Insomnia may be caused by another condition, situation, or substance, such as: Anxiety. Certain medicines. Gastroesophageal reflux disease (GERD) or other gastrointestinal conditions. Asthma or other breathing conditions. Restless legs syndrome, sleep apnea, or other sleep disorders. Chronic pain. Menopause. Stroke. Abuse of alcohol, tobacco, or illegal drugs. Mental health conditions, such as depression. Caffeine. Neurological disorders, such as Alzheimer's disease. An overactive thyroid (hyperthyroidism). Sometimes, the cause of insomnia may not be known. What increases the risk? Risk factors for insomnia include: Gender. Women are affected more often than men. Age. Insomnia is more common as you get older. Stress. Lack of exercise. Irregular work schedule or working  night shifts. Traveling between different time zones. Certain medical and mental health conditions. What are the signs or symptoms? If you have insomnia, the main symptom is having trouble falling asleep or having trouble staying asleep. This may lead to other symptoms, such as: Feeling fatigued or having low energy. Feeling nervous about going to sleep. Not feeling rested in the morning. Having trouble concentrating. Feeling irritable, anxious, or depressed. How is this diagnosed? This condition may be diagnosed based on: Your symptoms and medical history. Your health care provider may ask about: Your sleep habits. Any medical conditions you have. Your mental health. A physical exam. How is this treated? Treatment for insomnia depends on the cause. Treatment may focus on treating an underlying condition that is causing insomnia. Treatment may also include: Medicines to help you sleep. Counseling or therapy. Lifestyle  adjustments to help you sleep better. Follow these instructions at home: Eating and drinking  Limit or avoid alcohol, caffeinated beverages, and cigarettes, especially close to bedtime. These can disrupt your sleep. Do not eat a large meal or eat spicy foods right before bedtime. This can lead to digestive discomfort that can make it hard for you to sleep.  Sleep habits  Keep a sleep diary to help you and your health care provider figure out what could be causing your insomnia. Write down: When you sleep. When you wake up during the night. How well you sleep. How rested you feel the next day. Any side effects of medicines you are taking. What you eat and drink. Make your bedroom a dark, comfortable place where it is easy to fall asleep. Put up shades or blackout curtains to block light from outside. Use a white noise machine to block noise. Keep the temperature cool. Limit screen use before bedtime. This includes: Watching TV. Using your smartphone, tablet, or  computer. Stick to a routine that includes going to bed and waking up at the same times every day and night. This can help you fall asleep faster. Consider making a quiet activity, such as reading, part of your nighttime routine. Try to avoid taking naps during the day so that you sleep better at night. Get out of bed if you are still awake after 15 minutes of trying to sleep. Keep the lights down, but try reading or doing a quiet activity. When you feel sleepy, go back to bed.  General instructions Take over-the-counter and prescription medicines only as told by your health care provider. Exercise regularly, as told by your health care provider. Avoid exercise starting several hours before bedtime. Use relaxation techniques to manage stress. Ask your health care provider to suggest some techniques that may work well for you. These may include: Breathing exercises. Routines to release muscle tension. Visualizing peaceful scenes. Make sure that you drive carefully. Avoid driving if you feel very sleepy. Keep all follow-up visits as told by your health care provider. This is important. Contact a health care provider if: You are tired throughout the day. You have trouble in your daily routine due to sleepiness. You continue to have sleep problems, or your sleep problems get worse. Get help right away if: You have serious thoughts about hurting yourself or someone else. If you ever feel like you may hurt yourself or others, or have thoughts about taking your own life, get help right away. You can go to your nearest emergency department or call: Your local emergency services (911 in the [place] A suicide crisis helpline, such as the [facility] at 402 117 3310. This is open 24 hours a day. Summary Insomnia is a sleep disorder that makes it difficult to fall asleep or stay asleep. Insomnia can be long-term (chronic) or short-term (acute). Treatment for insomnia depends  on the cause. Treatment may focus on treating an underlying condition that is causing insomnia. Keep a sleep diary to help you and your health care provider figure out what could be causing your insomnia. This information is not intended to replace advice given to you by your health care provider. Make sure you discuss any questions you have with your healthcare provider. Document Revised: 03/02/2020 Document Reviewed: 03/02/2020 Elsevier Patient Education  2022 [employer].

## 2020-12-06 NOTE — Telephone Encounter (Signed)
  Chronic Care Management   Note  12/06/2020 Name: Sydney Evans MRN: [id number] DOB: 1950/05/08  The patient returned the call the to [facility] and the call was completed. See new encounter.   Follow up plan: Telephone follow up appointment with care management team member scheduled for:02-07-2021 at [place] am  Noreene Larsson RN, MSN, [facility] Mobile: [phone]

## 2020-12-11 ENCOUNTER — Other Ambulatory Visit: Payer: Self-pay | Admitting: Family Medicine

## 2020-12-18 ENCOUNTER — Other Ambulatory Visit: Payer: Self-pay | Admitting: Family Medicine

## 2020-12-18 ENCOUNTER — Telehealth: Payer: Medicare Other | Admitting: Nurse Practitioner

## 2020-12-18 NOTE — Telephone Encounter (Signed)
Copied from [place] [phone]. Topic: Medicare AWV >> Dec 18, 2020  3:02 PM Lavonia Drafts wrote: Reason for CRM:  LM: reschedule AWVS to 01/05/21 @ 11:15 due to [facility] schedule change. This will be by phone not in the office.

## 2020-12-18 NOTE — Telephone Encounter (Signed)
Patient last seen 11/20/20

## 2020-12-18 NOTE — Telephone Encounter (Signed)
Requested medication (s) are due for refill today -no  Requested medication (s) are on the active medication list -yes  Future visit scheduled -yes  Last refill: 10/12/20  Notes to clinic: Duplicate request- denied for request RF too soon- please review for early refill.  Requested Prescriptions  Pending Prescriptions Disp Refills   omeprazole (PRILOSEC) 40 MG capsule [Pharmacy Med Name: OMEPRAZOLE DR 40 MG CAPSULE] 135 capsule 0    Sig: Take 1-2 capsules (40-80 mg total) by mouth daily.     Gastroenterology: Proton Pump Inhibitors Passed - 12/18/2020  3:24 PM      Passed - Valid encounter within last 12 months    Recent Outpatient Visits           4 weeks ago Bipolar depression ([place])   [facility] Jon Billings, NP   2 months ago Sleep disturbance   [place], NP   2 months ago Sleep disturbance   [facility] Jon Billings, NP   3 months ago Bipolar depression [facility])   [facility] Jon Billings, NP   9 months ago Essential hypertension   [facility] [employer], [place], DO       Future Appointments             In 2 weeks  [facility], [place]   In 1 month Jon Billings, NP [place], [facility]               Requested Prescriptions  Pending Prescriptions Disp Refills   omeprazole (PRILOSEC) 40 MG capsule [Pharmacy Med Name: OMEPRAZOLE DR 40 MG CAPSULE] 135 capsule 0    Sig: Take 1-2 capsules (40-80 mg total) by mouth daily.     Gastroenterology: Proton Pump Inhibitors Passed - 12/18/2020  3:24 PM      Passed - Valid encounter within last 12 months    Recent Outpatient Visits           4 weeks ago Bipolar depression ([place])   [facility] Jon Billings, NP   2 months ago Sleep disturbance   [place], NP   2 months ago Sleep disturbance   [facility] Jon Billings, NP   3 months  ago Bipolar depression [facility])   [facility] Jon Billings, NP   9 months ago Essential hypertension   [place], Barb Merino, DO       Future Appointments             In 2 weeks  [facility], [place]   In 1 month Jon Billings, NP [facility], [place]

## 2020-12-21 ENCOUNTER — Other Ambulatory Visit: Payer: Self-pay

## 2020-12-21 ENCOUNTER — Encounter: Payer: Self-pay | Admitting: Oncology

## 2020-12-21 DIAGNOSIS — Z1231 Encounter for screening mammogram for malignant neoplasm of breast: Secondary | ICD-10-CM

## 2020-12-22 ENCOUNTER — Telehealth: Payer: Self-pay

## 2020-12-22 NOTE — Telephone Encounter (Signed)
Copied from [place] [phone]. Topic: General - Other >> Dec 22, 2020  9:33 AM Greggory Keen D wrote: Pt called asking whether a referral has been done for her to see a psychiatrist.  She said she is not sleeping at night.  She has a lot of anxiety  CB#  [phone]   Evelena Peat can you check on this referral please?

## 2020-12-27 ENCOUNTER — Telehealth: Payer: Self-pay

## 2020-12-27 NOTE — Telephone Encounter (Signed)
Referral was placed on 11/20/2020

## 2020-12-27 NOTE — Telephone Encounter (Signed)
Copied from [place] [id number]. Topic: Referral - Request for Referral >> Dec 27, 2020  9:16 AM Tessa Lerner A wrote: Has patient seen PCP for this complaint? Yes.    *If NO, is insurance requiring patient see PCP for this issue before PCP can refer them?  Referral for which specialty: Behavioral Health  Preferred provider/office: Patient has no preference   Reason for referral: Patient is having sleep and behavioral health concerns   Routing to provider.

## 2020-12-27 NOTE — Telephone Encounter (Signed)
Called and left a message letting patient know referral has been placed.  Sydney Evans: Can you check on this?

## 2021-01-03 ENCOUNTER — Ambulatory Visit: Payer: Medicare HMO

## 2021-01-04 ENCOUNTER — Telehealth: Payer: Self-pay | Admitting: Licensed Clinical Social Worker

## 2021-01-04 ENCOUNTER — Telehealth: Payer: Self-pay

## 2021-01-04 NOTE — Telephone Encounter (Signed)
Have you heard anything from [facility]

## 2021-01-04 NOTE — Telephone Encounter (Signed)
    Clinical Social Work  Chronic Care Management   Phone Outreach    01/04/2021 Name: Sydney Evans MRN: [id number] DOB: 11-12-1950  Sydney Evans is a 70 y.o. year old female who is a primary care patient of Jon Billings, NP .   Reason for referral: Mental Health Counseling and Resources.    F/U phone call today to assess needs, progress and barriers with care plan goals.   Telephone outreach was unsuccessful.  Plan:CCM LCSW will wait for return call. If no return call is received, Will route chart to Care Guide to see if patient would like to reschedule phone appointment   Review of patient status, including review of consultants reports, relevant laboratory and other test results, and collaboration with appropriate care team members and the patient's provider was performed as part of comprehensive patient evaluation and provision of care management services.     Christa See, MSW, [email or web address] Phone [phone] 5:10 PM

## 2021-01-05 ENCOUNTER — Ambulatory Visit: Payer: Medicare Other

## 2021-01-05 ENCOUNTER — Telehealth: Payer: Self-pay

## 2021-01-05 NOTE — Telephone Encounter (Signed)
This nurse attempted to call patient three times for scheduled telephonic AWV. Called at 1110, 1115, and 1125. Message left for patient to call in order to reschedule or we will call her.

## 2021-01-15 ENCOUNTER — Telehealth: Payer: Self-pay

## 2021-01-15 NOTE — Chronic Care Management (AMB) (Signed)
  Care Management   Note  01/15/2021 Name: Sydney Evans MRN: [id number] DOB: 04-12-1951  Sydney Evans is a 70 y.o. year old female who is a primary care patient of Jon Billings, NP and is actively engaged with the care management team. I reached out to Sydney Evans by phone today to assist with re-scheduling a follow up visit with the Licensed Clinical Social Worker  Follow up plan: Unsuccessful telephone outreach attempt made. A HIPAA compliant phone message was left for the patient providing contact information and requesting a return call.  The care management team will reach out to the patient again over the next 7 days.  If patient returns call to provider office, please advise to call [facility]  at [place], [place], [place], [place] [address] Direct Dial: [phone] [email or web address] Website: [email or web address]

## 2021-01-23 NOTE — Chronic Care Management (AMB) (Signed)
  Care Management   Note  01/23/2021 Name: Sydney Evans MRN: [id number] DOB: 1950-12-31  Sydney Evans is a 70 y.o. year old female who is a primary care patient of Sydney Billings, NP and is actively engaged with the care management team. I reached out to Sydney Evans by phone today to assist with re-scheduling a follow up visit with the RN Case Manager  Follow up plan: Patient declines further follow up and engagement by the care management team Sydney Evans and Sydney Evans. Appropriate care team members and provider have been notified via electronic communication.   Sydney Evans, [place], [place], [place] [address] Direct Dial: [phone] [email or web address] Website: [email or web address]

## 2021-01-24 ENCOUNTER — Ambulatory Visit: Payer: Medicare Other | Admitting: Nurse Practitioner

## 2021-01-29 NOTE — Telephone Encounter (Signed)
Pt declined rescheduling

## 2021-02-07 ENCOUNTER — Telehealth: Payer: Self-pay

## 2021-02-21 ENCOUNTER — Other Ambulatory Visit: Payer: Self-pay | Admitting: Family Medicine

## 2021-02-21 NOTE — Telephone Encounter (Signed)
Requested Prescriptions  Pending Prescriptions Disp Refills  . omeprazole (PRILOSEC) 40 MG capsule [Pharmacy Med Name: OMEPRAZOLE DR 40 MG CAPSULE] 135 capsule 1    Sig: Take 1-2 capsules (40-80 mg total) by mouth daily.     Gastroenterology: Proton Pump Inhibitors Passed - 02/21/2021 12:40 PM      Passed - Valid encounter within last 12 months    Recent Outpatient Visits          3 months ago Bipolar depression ([place])   [facility] Jon Billings, NP   4 months ago Sleep disturbance   [place], NP   4 months ago Sleep disturbance   [place], NP   5 months ago Bipolar depression [facility])   [place], NP   11 months ago Essential hypertension   [place], [place], DO

## 2021-03-26 ENCOUNTER — Other Ambulatory Visit: Payer: Self-pay | Admitting: Nurse Practitioner

## 2021-03-26 NOTE — Telephone Encounter (Signed)
Requested medication (s) are due for refill today - yes  Requested medication (s) are on the active medication list -yes  Future visit scheduled -no  Last refill: 12/11/20 #90  Notes to clinic: Request RF: non delegated Rx  Requested Prescriptions  Pending Prescriptions Disp Refills   lamoTRIgine (LAMICTAL) 200 MG tablet [Pharmacy Med Name: LAMOTRIGINE 200 MG TABLET] 90 tablet 0    Sig: Take 1 tablet (200 mg total) by mouth daily.     Not Delegated - Neurology:  Anticonvulsants Failed - 03/26/2021 10:42 AM      Failed - This refill cannot be delegated      Failed - HCT in normal range and within 360 days    Hematocrit  Date Value Ref Range Status  03/17/2020 30.8 (L) 34.0 - 46.6 % Final          Failed - HGB in normal range and within 360 days    Hemoglobin  Date Value Ref Range Status  03/17/2020 9.1 (L) 11.1 - 15.9 g/dL Final          Failed - PLT in normal range and within 360 days    Platelets  Date Value Ref Range Status  03/17/2020 396 150 - 450 x10E3/uL Final          Failed - WBC in normal range and within 360 days    WBC  Date Value Ref Range Status  03/17/2020 4.9 3.4 - 10.8 x10E3/uL Final  11/18/2019 7.4 4.0 - 10.5 K/uL Final          Passed - Valid encounter within last 12 months    Recent Outpatient Visits           4 months ago Bipolar depression ([place])   [facility] Jon Billings, NP   5 months ago Sleep disturbance   [place], NP   5 months ago Sleep disturbance   [place], NP   6 months ago Bipolar depression [facility])   [place], NP   1 year ago Essential hypertension   [facility] [place], [place], DO                 Requested Prescriptions  Pending Prescriptions Disp Refills   lamoTRIgine (LAMICTAL) 200 MG tablet [Pharmacy Med Name: LAMOTRIGINE 200 MG TABLET] 90 tablet 0    Sig: Take 1 tablet (200 mg  total) by mouth daily.     Not Delegated - Neurology:  Anticonvulsants Failed - 03/26/2021 10:42 AM      Failed - This refill cannot be delegated      Failed - HCT in normal range and within 360 days    Hematocrit  Date Value Ref Range Status  03/17/2020 30.8 (L) 34.0 - 46.6 % Final          Failed - HGB in normal range and within 360 days    Hemoglobin  Date Value Ref Range Status  03/17/2020 9.1 (L) 11.1 - 15.9 g/dL Final          Failed - PLT in normal range and within 360 days    Platelets  Date Value Ref Range Status  03/17/2020 396 150 - 450 x10E3/uL Final          Failed - WBC in normal range and within 360 days    WBC  Date Value Ref Range Status  03/17/2020 4.9 3.4 - 10.8 x10E3/uL Final  11/18/2019 7.4 4.0 -  10.5 K/uL Final          Passed - Valid encounter within last 12 months    Recent Outpatient Visits           4 months ago Bipolar depression ([place])   [facility] Jon Billings, NP   5 months ago Sleep disturbance   [place], NP   5 months ago Sleep disturbance   [place], NP   6 months ago Bipolar depression [facility])   [facility] Jon Billings, NP   1 year ago Essential hypertension   [place], [place], DO

## 2021-03-27 NOTE — Telephone Encounter (Signed)
Please find out if patient has gone to Psych.  Please verify if she is still taking the lamictal.

## 2021-03-27 NOTE — Telephone Encounter (Signed)
Please see previous note.

## 2021-03-28 NOTE — Telephone Encounter (Signed)
Called and LVM asking for patient to please return my call.  

## 2021-04-03 NOTE — Telephone Encounter (Signed)
Called and LVM asking for patient to please return my call.  

## 2021-04-12 ENCOUNTER — Other Ambulatory Visit: Payer: Self-pay | Admitting: Nurse Practitioner

## 2021-04-27 ENCOUNTER — Other Ambulatory Visit: Payer: Self-pay | Admitting: Nurse Practitioner

## 2021-04-27 NOTE — Telephone Encounter (Signed)
Requested Prescriptions  Pending Prescriptions Disp Refills   omeprazole (PRILOSEC) 40 MG capsule [Pharmacy Med Name: OMEPRAZOLE DR 40 MG CAPSULE] 135 capsule 0    Sig: Take 1-2 capsules (40-80 mg total) by mouth daily.     Gastroenterology: Proton Pump Inhibitors Passed - 04/27/2021  9:36 AM      Passed - Valid encounter within last 12 months    Recent Outpatient Visits          5 months ago Bipolar depression ([place])   [facility] Jon Billings, NP   6 months ago Sleep disturbance   [place], NP   6 months ago Sleep disturbance   [place], NP   7 months ago Bipolar depression [facility])   [facility] Jon Billings, NP   1 year ago Essential hypertension   [place], [place], DO

## 2021-05-01 ENCOUNTER — Other Ambulatory Visit: Payer: Self-pay

## 2021-05-01 ENCOUNTER — Encounter: Payer: Self-pay | Admitting: Emergency Medicine

## 2021-05-01 ENCOUNTER — Emergency Department: Payer: Medicare Other

## 2021-05-01 DIAGNOSIS — D649 Anemia, unspecified: Secondary | ICD-10-CM | POA: Insufficient documentation

## 2021-05-01 DIAGNOSIS — Z96612 Presence of left artificial shoulder joint: Secondary | ICD-10-CM | POA: Diagnosis not present

## 2021-05-01 DIAGNOSIS — N3000 Acute cystitis without hematuria: Secondary | ICD-10-CM | POA: Diagnosis not present

## 2021-05-01 DIAGNOSIS — F17210 Nicotine dependence, cigarettes, uncomplicated: Secondary | ICD-10-CM | POA: Insufficient documentation

## 2021-05-01 DIAGNOSIS — I10 Essential (primary) hypertension: Secondary | ICD-10-CM | POA: Diagnosis not present

## 2021-05-01 DIAGNOSIS — Z96653 Presence of artificial knee joint, bilateral: Secondary | ICD-10-CM | POA: Diagnosis not present

## 2021-05-01 DIAGNOSIS — Z8542 Personal history of malignant neoplasm of other parts of uterus: Secondary | ICD-10-CM | POA: Diagnosis not present

## 2021-05-01 DIAGNOSIS — Z79899 Other long term (current) drug therapy: Secondary | ICD-10-CM | POA: Insufficient documentation

## 2021-05-01 DIAGNOSIS — R109 Unspecified abdominal pain: Secondary | ICD-10-CM | POA: Diagnosis present

## 2021-05-01 DIAGNOSIS — U071 COVID-19: Secondary | ICD-10-CM | POA: Diagnosis not present

## 2021-05-01 LAB — COMPREHENSIVE METABOLIC PANEL
ALT: 13 U/L (ref 0–44)
AST: 23 U/L (ref 15–41)
Albumin: 3.4 g/dL — ABNORMAL LOW (ref 3.5–5.0)
Alkaline Phosphatase: 58 U/L (ref 38–126)
Anion gap: 9 (ref 5–15)
BUN: 19 mg/dL (ref 8–23)
CO2: 19 mmol/L — ABNORMAL LOW (ref 22–32)
Calcium: 8.6 mg/dL — ABNORMAL LOW (ref 8.9–10.3)
Chloride: 104 mmol/L (ref 98–111)
Creatinine, Ser: 1.14 mg/dL — ABNORMAL HIGH (ref 0.44–1.00)
GFR, Estimated: 52 mL/min — ABNORMAL LOW (ref >60)
Glucose, Bld: 83 mg/dL (ref 70–99)
Potassium: 3.5 mmol/L (ref 3.5–5.1)
Sodium: 132 mmol/L — ABNORMAL LOW (ref 135–145)
Total Bilirubin: 0.6 mg/dL (ref 0.3–1.2)
Total Protein: 6.9 g/dL (ref 6.5–8.1)

## 2021-05-01 LAB — RESP PANEL BY RT-PCR (FLU A&B, COVID) ARPGX2
Influenza A by PCR: NEGATIVE
Influenza B by PCR: NEGATIVE
SARS Coronavirus 2 by RT PCR: POSITIVE — AB

## 2021-05-01 LAB — CBC
HCT: 28.3 % — ABNORMAL LOW (ref 36.0–46.0)
Hemoglobin: 7.6 g/dL — ABNORMAL LOW (ref 12.0–15.0)
MCH: 17.1 pg — ABNORMAL LOW (ref 26.0–34.0)
MCHC: 26.9 g/dL — ABNORMAL LOW (ref 30.0–36.0)
MCV: 63.6 fL — ABNORMAL LOW (ref 80.0–100.0)
Platelets: 183 10*3/uL (ref 150–400)
RBC: 4.45 MIL/uL (ref 3.87–5.11)
RDW: 21.8 % — ABNORMAL HIGH (ref 11.5–15.5)
WBC: 3 10*3/uL — ABNORMAL LOW (ref 4.0–10.5)
nRBC: 1 % — ABNORMAL HIGH (ref 0.0–0.2)

## 2021-05-01 LAB — MAGNESIUM: Magnesium: 1.8 mg/dL (ref 1.7–2.4)

## 2021-05-01 LAB — LIPASE, BLOOD: Lipase: 38 U/L (ref 11–51)

## 2021-05-01 LAB — TROPONIN I (HIGH SENSITIVITY): Troponin I (High Sensitivity): 26 ng/L — ABNORMAL HIGH (ref <18)

## 2021-05-01 MED ORDER — ONDANSETRON 4 MG PO TBDP
4.0000 mg | ORAL_TABLET | Freq: Once | ORAL | Status: DC
  Filled 2021-05-01: qty 1

## 2021-05-01 NOTE — ED Triage Notes (Signed)
Pt to ED from home c/o abd pain all over, nausea and dry heaves, COVID tests at home have been negative, heart burn, SOB all going on for the last week, dizzy, light headed, non productive strong cough.  States has been trouble getting up to walk from one room to another d/t feeling like she will pass out, uses walker at home.

## 2021-05-01 NOTE — ED Provider Notes (Signed)
Emergency Medicine Provider Triage Evaluation Note  Sydney Evans, a 70 y.o. female  was evaluated in triage.  Pt complains of generalized abdominal pain, nausea, and dry heaves. She also notes a non-productive cough and generalized weakness.   Review of Systems  Positive: Cough, abd pain Negative: FCS  Physical Exam  BP 120/86 (BP Location: Left Arm)    Pulse 76    Temp 98.1 F (36.7 C) (Oral)    Resp 18    Ht 5\' 2"  (1.575 m)    Wt 66.7 kg    SpO2 96%    BMI 26.89 kg/m  Gen:   Awake, no distress  NAD Resp:  Normal effort CTA MSK:   Moves extremities without difficulty  Other:  CVS: RRR  Medical Decision Making  Medically screening exam initiated at 7:25 PM.  Appropriate orders placed.  Sydney Evans was informed that the remainder of the evaluation will be completed by another provider, this initial triage assessment does not replace that evaluation, and the importance of remaining in the ED until their evaluation is complete.  Geriatric patient with ED evaluation multiple complaints including abdominal pain, cough, and fatigue.   Sydney Needles, PA-C 05/01/21 Sydney Agent, MD 05/01/21 2143

## 2021-05-02 ENCOUNTER — Emergency Department
Admission: EM | Admit: 2021-05-02 | Discharge: 2021-05-02 | Disposition: A | Discharge status: Home / Self Care | Payer: Medicare Other | Attending: Emergency Medicine | Admitting: Emergency Medicine

## 2021-05-02 ENCOUNTER — Emergency Department: Payer: Medicare Other

## 2021-05-02 ENCOUNTER — Encounter: Payer: Self-pay | Admitting: Radiology

## 2021-05-02 DIAGNOSIS — N3000 Acute cystitis without hematuria: Secondary | ICD-10-CM | POA: Diagnosis not present

## 2021-05-02 DIAGNOSIS — R112 Nausea with vomiting, unspecified: Secondary | ICD-10-CM

## 2021-05-02 DIAGNOSIS — R1084 Generalized abdominal pain: Secondary | ICD-10-CM

## 2021-05-02 DIAGNOSIS — U071 COVID-19: Secondary | ICD-10-CM

## 2021-05-02 DIAGNOSIS — D649 Anemia, unspecified: Secondary | ICD-10-CM

## 2021-05-02 LAB — URINALYSIS, ROUTINE W REFLEX MICROSCOPIC
Bilirubin Urine: NEGATIVE
Glucose, UA: NEGATIVE mg/dL
Hgb urine dipstick: NEGATIVE
Ketones, ur: 5 mg/dL — AB
Nitrite: NEGATIVE
Protein, ur: 100 mg/dL — AB
Specific Gravity, Urine: 1.019 (ref 1.005–1.030)
pH: 5 (ref 5.0–8.0)

## 2021-05-02 LAB — TROPONIN I (HIGH SENSITIVITY): Troponin I (High Sensitivity): 25 ng/L — ABNORMAL HIGH (ref <18)

## 2021-05-02 MED ORDER — CEPHALEXIN 500 MG PO CAPS: 500.0000 mg | ORAL_CAPSULE | Freq: Two times a day (BID) | ORAL | 0 refills | Status: AC

## 2021-05-02 MED ORDER — MORPHINE SULFATE (PF) 4 MG/ML IV SOLN
4.0000 mg | Freq: Once | INTRAVENOUS | Status: AC
  Administered 2021-05-02: 08:00:00 4 mg via INTRAVENOUS
  Filled 2021-05-02: qty 1

## 2021-05-02 MED ORDER — PROCHLORPERAZINE MALEATE 10 MG PO TABS: 10.0000 mg | ORAL_TABLET | Freq: Four times a day (QID) | ORAL | 0 refills | Status: DC | PRN

## 2021-05-02 MED ORDER — IOHEXOL 300 MG/ML  SOLN
75.0000 mL | Freq: Once | INTRAMUSCULAR | Status: AC | PRN
  Administered 2021-05-02: 09:00:00 75 mL via INTRAVENOUS

## 2021-05-02 MED ORDER — LACTATED RINGERS IV BOLUS
1000.0000 mL | Freq: Once | INTRAVENOUS | Status: AC
  Administered 2021-05-02: 08:00:00 1000 mL via INTRAVENOUS

## 2021-05-02 MED ORDER — PROCHLORPERAZINE EDISYLATE 10 MG/2ML IJ SOLN
10.0000 mg | Freq: Once | INTRAMUSCULAR | Status: AC
  Administered 2021-05-02: 08:00:00 10 mg via INTRAVENOUS
  Filled 2021-05-02: qty 2

## 2021-05-02 MED ORDER — ONDANSETRON 4 MG PO TBDP: 4.0000 mg | ORAL_TABLET | Freq: Three times a day (TID) | ORAL | 0 refills | Status: DC | PRN

## 2021-05-02 MED ORDER — ONDANSETRON HCL 4 MG/2ML IJ SOLN: 4.0000 mg | Freq: Once | INTRAMUSCULAR | Status: DC

## 2021-05-02 MED ORDER — SODIUM CHLORIDE 0.9 % IV SOLN
1.0000 g | Freq: Once | INTRAVENOUS | Status: AC
  Administered 2021-05-02: 09:00:00 1 g via INTRAVENOUS
  Filled 2021-05-02: qty 10

## 2021-05-02 MED ORDER — NIRMATRELVIR/RITONAVIR (PAXLOVID) TABLET (RENAL DOSING): 2.0000 | ORAL_TABLET | Freq: Two times a day (BID) | ORAL | 0 refills | Status: AC

## 2021-05-02 NOTE — ED Notes (Signed)
Pt ambulated on room air and pulse ox remained at 93%. Pt denies any dizziness, states she feels much better.

## 2021-05-02 NOTE — ED Notes (Signed)
Tried to draw repeat troponin 1 unsuccessful attempt by this RN and 2 attempts made by [facility] EDT.  Lab called to draw.

## 2021-05-02 NOTE — ED Provider Notes (Signed)
[facility] Emergency Department Provider Note   ____________________________________________   Event Date/Time   First MD Initiated Contact with Patient 05/02/21 0700     (approximate)  I have reviewed the triage vital signs and the nursing notes.   HISTORY  Chief Complaint Abdominal Pain    HPI Sydney Evans is a 70 y.o. female with past medical history of hypertension, hyperlipidemia, stroke, GERD, and bipolar disorder who presents to the ED complaining of abdominal pain.  Patient reports that she has been dealing with 1 week of gradually worsening constant pain diffusely across her entire abdomen.  She describes the pain as sharp and squeezing, not exacerbated or alleviated by anything.  It has been associated with nausea and decreased appetite, patient states she has not had anything to eat or drink in a couple of days.  She denies any associated diarrhea and has not vomited.  She does endorse dysuria but denies any flank pain.  She does admit to a fever of 103 a couple of days ago, is not aware of any fever since then.  She states she has been feeling increasingly weak over the past couple of days and is very lightheaded when she tries to walk.  She has had a cough but denies any chest pain or shortness of breath.  She is not aware of any sick contacts.        Past Medical History:  Diagnosis Date   Anemia    Anxiety    Arthritis    Barrett's esophagus    Bipolar disorder ([place])    Blocked artery    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   Blood clot in vein    Cancer ([place])    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   Carotid arterial disease ([place])    Contracture of joint of upper arm    Depression    Elevated lipids    GERD (gastroesophageal reflux disease)    Hypertension    Migraine    Osteoporosis    Poor balance    Sinus congestion    Stroke ([place])    x 2   TIA (transient ischemic attack)    Vertigo     Patient Active Problem List   Diagnosis Date Noted   Sleep disturbance  10/06/2020   Fall 09/18/2020   Duodenal nodule 03/17/2020   Bipolar depression ([place]) 11/17/2019   Anxiety 11/17/2019   Generalized abdominal pain    Traumatic complete tear of right rotator cuff 10/08/2019   Hypokalemia 10/07/2019   Hypomagnesemia 10/07/2019   Dislocation of right shoulder joint 10/06/2019   NSVT (nonsustained ventricular tachycardia)    Ileus ([place]) 10/03/2019   Status post total shoulder arthroplasty, 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   Post-concussion headache 01/20/2018   Iron deficiency anemia 10/05/2017   Carotid stenosis 11/17/2016   GERD (gastroesophageal reflux disease) 11/17/2016   Essential hypertension 11/17/2016   Hyperlipidemia 11/17/2016   Status post total shoulder replacement, 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   Rotator cuff tendinitis, left 05/20/2016   History of TIAs 07/17/2015   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   TIA (transient ischemic attack) 12/23/2014   Localized, 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   Disorder of bone and articular cartilage 09/29/2014    Past Surgical History:  Procedure Laterality Date   ABDOMINAL HYSTERECTOMY     BACK SURGERY     BREAST BIOPSY Left    CARPAL TUNNEL RELEASE Bilateral    CATARACT EXTRACTION W/ INTRAOCULAR LENS  IMPLANT, BILATERAL Bilateral    CHOLECYSTECTOMY  COLONOSCOPY WITH PROPOFOL N/A 04/09/2018   Procedure: COLONOSCOPY WITH PROPOFOL;  Surgeon: Jonathon Bellows, MD;  Location[facility] ENDOSCOPY;  Service: Gastroenterology;  Laterality: N/A;   crystal cyst removed on left foot     ESOPHAGOGASTRODUODENOSCOPY (EGD) WITH PROPOFOL N/A 04/09/2018   Procedure: ESOPHAGOGASTRODUODENOSCOPY (EGD) WITH PROPOFOL;  Surgeon: Jonathon Bellows, MD;  Location[facility] ENDOSCOPY;  Service: Gastroenterology;  Laterality: N/A;   ESOPHAGOGASTRODUODENOSCOPY (EGD) WITH PROPOFOL N/A 12/14/2019   Procedure: ESOPHAGOGASTRODUODENOSCOPY (EGD) WITH PROPOFOL;  Surgeon: Lesly Rubenstein, MD;  Location: [facility] ENDOSCOPY;  Service: Endoscopy;   Laterality: N/A;   EYE SURGERY     femoral fx     FOOT SURGERY     GIVENS CAPSULE STUDY N/A 06/16/2018   Procedure: GIVENS CAPSULE STUDY;  Surgeon: Jonathon Bellows, MD;  Location: [facility] ENDOSCOPY;  Service: Gastroenterology;  Laterality: N/A;   JOINT REPLACEMENT     KNEE SURGERY     ORIF PERIPROSTHETIC FRACTURE Right 10/07/2019   Procedure: OPEN REDUCTION INTERNAL FIXATION (ORIF) PERIPROSTHETIC FRACTURE;  Surgeon: Corky Mull, MD;  Location: [facility] ORS;  Service: Orthopedics;  Laterality: Right;   TONSILLECTOMY     TOTAL KNEE ARTHROPLASTY Bilateral    TOTAL SHOULDER ARTHROPLASTY Left 08/01/2016   Procedure: TOTAL SHOULDER ARTHROPLASTY;  Surgeon: Corky Mull, MD;  Location: [facility] ORS;  Service: Orthopedics;  Laterality: Left;   TOTAL SHOULDER ARTHROPLASTY Right 09/30/2019   Procedure: TOTAL SHOULDER ARTHROPLASTY;  Surgeon: Corky Mull, MD;  Location: [facility] ORS;  Service: Orthopedics;  Laterality: Right;    Prior to Admission medications   Medication Sig Start Date End Date Taking? Authorizing Provider  cephALEXin (KEFLEX) 500 MG capsule Take 1 capsule (500 mg total) by mouth 2 (two) times daily for 7 days. 05/02/21 05/09/21 Yes Blake Divine, MD  nirmatrelvir/ritonavir EUA, renal dosing, (PAXLOVID) 10 x 150 MG & 10 x 100MG  TABS Take 2 tablets by mouth 2 (two) times daily for 5 days. Patient GFR is 52. Take nirmatrelvir (150 mg) one tablet twice daily for 5 days and ritonavir (100 mg) one tablet twice daily for 5 days. 05/02/21 05/07/21 Yes Blake Divine, MD  ondansetron (ZOFRAN-ODT) 4 MG disintegrating tablet Take 1 tablet (4 mg total) by mouth every 8 (eight) hours as needed for nausea or vomiting. 05/02/21  Yes Blake Divine, MD  Carboxymethylcellulose Sodium (LUBRICANT EYE DROPS OP) Place 1 drop into both eyes daily as needed (Dry eye). Systane    [provider]  citalopram (CELEXA) 40 MG tablet Take 1.5 tablets (60 mg total) by mouth daily. 04/12/21   Jon Billings, NP  diclofenac  Sodium (VOLTAREN) 1 % GEL Apply 4 g topically 4 (four) times daily. 03/17/20   Johnson, Megan P, DO  diphenhydrAMINE (BENADRYL) 25 MG tablet Take 25 mg by mouth every 6 (six) hours as needed for itching.    [provider]  fluticasone (FLONASE) 50 MCG/ACT nasal spray Place 2 sprays into both nostrils daily as needed for allergies or rhinitis. 10/03/20   Jon Billings, NP  ibuprofen (ADVIL) 800 MG tablet Take by mouth. 04/01/17   [provider]  lamoTRIgine (LAMICTAL) 200 MG tablet Take 1 tablet (200 mg total) by mouth daily. 12/11/20   Jon Billings, NP  omeprazole (PRILOSEC) 40 MG capsule Take 1-2 capsules (40-80 mg total) by mouth daily. 04/27/21   Jon Billings, NP  polyethylene glycol powder (GLYCOLAX/MIRALAX) 17 GM/SCOOP powder Take 17 g by mouth 2 (two) times daily as needed. 03/17/20   Johnson, Megan P, DO  Travoprost, BAK  Free, (TRAVATAN) 0.004 % SOLN ophthalmic solution Place 1 drop into both eyes at bedtime. 08/10/19   [provider]    Allergies Hydroxyzine, Gabapentin, Dilaudid [hydromorphone], Hydrocodone, Ketorolac, Percocet [oxycodone-acetaminophen], and Toradol [ketorolac tromethamine]  Family History  Problem Relation Age of Onset   Other Mother        ?lupus    Cancer Mother    High Cholesterol Mother    CAD Father        CABG   High Cholesterol Father    Arthritis Father    Cancer Sister    Heart murmur Sister    Heart murmur Brother    High blood pressure Other        "for everybody"   High Cholesterol Other        "for everybody"    Social History Social History   Tobacco Use   Smoking status: Every Day    Packs/day: 0.50    Types: Cigarettes    Last attempt to quit: 08/04/2016    Years since quitting: 4.7   Smokeless tobacco: Never  Vaping Use   Vaping Use: Never used  Substance Use Topics   Alcohol use: No   Drug use: No    Review of Systems  Constitutional: Positive for fever/chills Eyes: No visual  changes. ENT: No sore throat. Cardiovascular: Denies chest pain. Respiratory: Denies shortness of breath.  Positive for cough. Gastrointestinal: No abdominal pain.  No positive for abdominal pain and nausea, no vomiting.  No diarrhea.  No constipation. Genitourinary: Positive for dysuria. Musculoskeletal: Negative for back pain. Skin: Negative for rash. Neurological: Negative for headaches, focal weakness or numbness.  ____________________________________________   PHYSICAL EXAM:  VITAL SIGNS: ED Triage Vitals  Enc Vitals Group     BP 05/01/21 1917 120/86     Pulse Rate 05/01/21 1917 76     Resp 05/01/21 1917 18     Temp 05/01/21 1917 98.1 F (36.7 C)     Temp Source 05/01/21 1917 Oral     SpO2 05/01/21 1917 96 %     Weight 05/01/21 1918 147 lb (66.7 kg)     Height 05/01/21 1918 5\' 2"  (1.575 m)     Head Circumference --      Peak Flow --      Pain Score 05/01/21 1918 9     Pain Loc --      Pain Edu? --      Excl. in [place]? --     Constitutional: Alert and oriented. Eyes: Conjunctivae are normal. Head: Atraumatic. Nose: No congestion/rhinnorhea. Mouth/Throat: Mucous membranes are moist. Neck: Normal ROM Cardiovascular: Normal rate, regular rhythm. Grossly normal heart sounds.  2+ radial pulses bilaterally. Respiratory: Normal respiratory effort.  No retractions. Lungs CTAB. Gastrointestinal: Soft and diffusely tender to palpation with no rebound or guarding. No distention. Genitourinary: deferred Musculoskeletal: No lower extremity tenderness nor edema. Neurologic:  Normal speech and language. No gross focal neurologic deficits are appreciated. Skin:  Skin is warm, dry and intact. No rash noted. Psychiatric: Mood and affect are normal. Speech and behavior are normal.  ____________________________________________   LABS (all labs ordered are listed, but only abnormal results are displayed)  Labs Reviewed  RESP PANEL BY RT-PCR (FLU A&B, COVID) ARPGX2 - Abnormal;  Notable for the following components:      Result Value   SARS Coronavirus 2 by RT PCR POSITIVE (*)    All other components within normal limits  COMPREHENSIVE METABOLIC PANEL - Abnormal; Notable for  the following components:   Sodium 132 (*)    CO2 19 (*)    Creatinine, Ser 1.14 (*)    Calcium 8.6 (*)    Albumin 3.4 (*)    GFR, Estimated 52 (*)    All other components within normal limits  CBC - Abnormal; Notable for the following components:   WBC 3.0 (*)    Hemoglobin 7.6 (*)    HCT 28.3 (*)    MCV 63.6 (*)    MCH 17.1 (*)    MCHC 26.9 (*)    RDW 21.8 (*)    nRBC 1.0 (*)    All other components within normal limits  URINALYSIS, ROUTINE W REFLEX MICROSCOPIC - Abnormal; Notable for the following components:   Color, Urine YELLOW (*)    APPearance HAZY (*)    Ketones, ur 5 (*)    Protein, ur 100 (*)    Leukocytes,Ua MODERATE (*)    Bacteria, UA MANY (*)    All other components within normal limits  TROPONIN I (HIGH SENSITIVITY) - Abnormal; Notable for the following components:   Troponin I (High Sensitivity) 26 (*)    All other components within normal limits  TROPONIN I (HIGH SENSITIVITY) - Abnormal; Notable for the following components:   Troponin I (High Sensitivity) 25 (*)    All other components within normal limits  URINE CULTURE  LIPASE, BLOOD  MAGNESIUM   ____________________________________________  EKG  ED ECG REPORT I, Blake Divine, the attending physician, personally viewed and interpreted this ECG.   Date: 05/02/2021  EKG Time: 19:21  Rate: 92  Rhythm: normal sinus rhythm  Axis: Normal  Intervals: Prolonged QT  ST&T Change: None   PROCEDURES  Procedure(s) performed (including Critical Care):  Procedures   ____________________________________________   INITIAL IMPRESSION / ASSESSMENT AND PLAN / ED COURSE      70 year old female with past medical history of hypertension, hyperlipidemia, stroke, GERD, and bipolar disorder who presents  to the ED with 1 week of gradually worsening diffuse abdominal pain associate with nausea and poor p.o. intake.  Patient is diffusely tender to palpation on exam and we will further assess with CT scan.  Labs thus far are reassuring, LFTs and lipase within normal limits.  Patient noted to be anemic, however this is a chronic issue for her and is stable since September.  Patient has EGD and colonoscopy scheduled in 2 days for further evaluation of this.  She does endorse dysuria and UA is concerning for infection, we will send for culture and plan to treat for UTI.  Patient has tested positive for COVID-19 today in addition to her UTI, which could be contributing to her cough, fever, generalized weakness.  She is not in any respiratory distress and is maintaining O2 sats on room air, chest x-ray reviewed by me and shows no infiltrate, edema, or effusion.  EKG is significant for prolonged QT interval but does not show any ischemic changes, pulmonary mildly elevated but stable on recheck.  Her prolonged QT does limit antiemetic treatment and we will treat with IV Compazine given this would have the least effect on her QT interval.  Patient feeling much better following IV fluids and Compazine, is now tolerating p.o. without difficulty.  CT scan is negative for acute process and I suspect her abdominal pain is due to commendation of COVID-19 and UTI.  She was given dose of Rocephin, now able to ambulate without significant difficulty breathing, maintains O2 sats at 93% on room air.  She is appropriate for outpatient management and will be prescribed reduced dose of Paxlovid along with Keflex for UTI.  She will be prescribed Compazine for use as needed for nausea and vomiting, was counseled to follow-up with her PCP and to return to the ED for new worsening symptoms.  Patient agrees with plan.      ____________________________________________   FINAL CLINICAL IMPRESSION(S) / ED DIAGNOSES  Final diagnoses:   COVID-19  Generalized abdominal pain  Acute cystitis without hematuria  Nausea and vomiting, unspecified vomiting type  Anemia, unspecified type     ED Discharge Orders          Ordered    nirmatrelvir/ritonavir EUA, renal dosing, (PAXLOVID) 10 x 150 MG & 10 x 100MG  TABS  2 times daily        05/02/21 1154    ondansetron (ZOFRAN-ODT) 4 MG disintegrating tablet  Every 8 hours PRN        05/02/21 1154    cephALEXin (KEFLEX) 500 MG capsule  2 times daily        05/02/21 1154             Note:  This document was prepared using Dragon voice recognition software and may include unintentional dictation errors.    Blake Divine, MD 05/02/21 [phone]

## 2021-05-04 ENCOUNTER — Encounter: Admission: RE | Payer: Self-pay | Source: Home / Self Care

## 2021-05-04 ENCOUNTER — Ambulatory Visit: Admission: RE | Admit: 2021-05-04 | Payer: Medicare Other | Source: Home / Self Care

## 2021-05-04 LAB — URINE CULTURE: Culture: >=100000 — AB

## 2021-05-04 SURGERY — COLONOSCOPY WITH PROPOFOL
Anesthesia: General

## 2021-05-24 ENCOUNTER — Telehealth: Payer: Self-pay | Admitting: Nurse Practitioner

## 2021-05-24 NOTE — Telephone Encounter (Signed)
Copied from [place] [phone]. Topic: Medicare AWV >> May 24, 2021 10:11 AM Lavonia Drafts wrote: Reason for CRM: Left message for patient to call back and schedule the Medicare Annual Wellness Visit (AWV) virtually or by telephone.  Last AWV 01/03/20  Please schedule at anytime with [facility]-Nurse Health Advisor.  45 minute appointment  Any questions, please call me at [phone]

## 2021-06-21 ENCOUNTER — Inpatient Hospital Stay
Admission: EM | Admit: 2021-06-21 | Discharge: 2021-06-26 | DRG: 253 | Disposition: A | Discharge status: Home / Self Care | Payer: Medicare Other | Attending: Internal Medicine | Admitting: Internal Medicine

## 2021-06-21 ENCOUNTER — Encounter
Admission: EM | Disposition: A | Discharge status: Home / Self Care | Payer: Self-pay | Source: Home / Self Care | Attending: Student

## 2021-06-21 ENCOUNTER — Other Ambulatory Visit: Payer: Self-pay

## 2021-06-21 ENCOUNTER — Encounter: Payer: Self-pay | Admitting: Emergency Medicine

## 2021-06-21 DIAGNOSIS — Z20822 Contact with and (suspected) exposure to covid-19: Secondary | ICD-10-CM | POA: Diagnosis present

## 2021-06-21 DIAGNOSIS — Z79899 Other long term (current) drug therapy: Secondary | ICD-10-CM | POA: Diagnosis not present

## 2021-06-21 DIAGNOSIS — R1084 Generalized abdominal pain: Secondary | ICD-10-CM | POA: Diagnosis present

## 2021-06-21 DIAGNOSIS — I6521 Occlusion and stenosis of right carotid artery: Secondary | ICD-10-CM | POA: Diagnosis present

## 2021-06-21 DIAGNOSIS — K3189 Other diseases of stomach and duodenum: Secondary | ICD-10-CM | POA: Diagnosis present

## 2021-06-21 DIAGNOSIS — Z8673 Personal history of transient ischemic attack (TIA), and cerebral infarction without residual deficits: Secondary | ICD-10-CM

## 2021-06-21 DIAGNOSIS — E872 Acidosis, unspecified: Secondary | ICD-10-CM | POA: Diagnosis present

## 2021-06-21 DIAGNOSIS — Z66 Do not resuscitate: Secondary | ICD-10-CM | POA: Diagnosis present

## 2021-06-21 DIAGNOSIS — F17210 Nicotine dependence, cigarettes, uncomplicated: Secondary | ICD-10-CM | POA: Diagnosis present

## 2021-06-21 DIAGNOSIS — I998 Other disorder of circulatory system: Secondary | ICD-10-CM | POA: Diagnosis present

## 2021-06-21 DIAGNOSIS — Z888 Allergy status to other drugs, medicaments and biological substances status: Secondary | ICD-10-CM

## 2021-06-21 DIAGNOSIS — Z96653 Presence of artificial knee joint, bilateral: Secondary | ICD-10-CM | POA: Diagnosis present

## 2021-06-21 DIAGNOSIS — E876 Hypokalemia: Secondary | ICD-10-CM | POA: Diagnosis not present

## 2021-06-21 DIAGNOSIS — K319 Disease of stomach and duodenum, unspecified: Secondary | ICD-10-CM | POA: Diagnosis present

## 2021-06-21 DIAGNOSIS — I70221 Atherosclerosis of native arteries of extremities with rest pain, right leg: Secondary | ICD-10-CM

## 2021-06-21 DIAGNOSIS — Z96611 Presence of right artificial shoulder joint: Secondary | ICD-10-CM | POA: Diagnosis present

## 2021-06-21 DIAGNOSIS — E162 Hypoglycemia, unspecified: Secondary | ICD-10-CM | POA: Diagnosis present

## 2021-06-21 DIAGNOSIS — I10 Essential (primary) hypertension: Secondary | ICD-10-CM | POA: Diagnosis present

## 2021-06-21 DIAGNOSIS — I70201 Unspecified atherosclerosis of native arteries of extremities, right leg: Secondary | ICD-10-CM

## 2021-06-21 DIAGNOSIS — Z8249 Family history of ischemic heart disease and other diseases of the circulatory system: Secondary | ICD-10-CM | POA: Diagnosis not present

## 2021-06-21 DIAGNOSIS — Z8616 Personal history of COVID-19: Secondary | ICD-10-CM | POA: Diagnosis not present

## 2021-06-21 DIAGNOSIS — D509 Iron deficiency anemia, unspecified: Secondary | ICD-10-CM | POA: Diagnosis present

## 2021-06-21 DIAGNOSIS — F319 Bipolar disorder, unspecified: Secondary | ICD-10-CM | POA: Diagnosis present

## 2021-06-21 DIAGNOSIS — M810 Age-related osteoporosis without current pathological fracture: Secondary | ICD-10-CM | POA: Diagnosis present

## 2021-06-21 DIAGNOSIS — E871 Hypo-osmolality and hyponatremia: Secondary | ICD-10-CM | POA: Diagnosis present

## 2021-06-21 DIAGNOSIS — Z885 Allergy status to narcotic agent status: Secondary | ICD-10-CM

## 2021-06-21 DIAGNOSIS — K219 Gastro-esophageal reflux disease without esophagitis: Secondary | ICD-10-CM | POA: Diagnosis present

## 2021-06-21 DIAGNOSIS — M79604 Pain in right leg: Secondary | ICD-10-CM

## 2021-06-21 DIAGNOSIS — K2270 Barrett's esophagus without dysplasia: Secondary | ICD-10-CM | POA: Diagnosis present

## 2021-06-21 DIAGNOSIS — Z8542 Personal history of malignant neoplasm of other parts of uterus: Secondary | ICD-10-CM

## 2021-06-21 DIAGNOSIS — Z86718 Personal history of other venous thrombosis and embolism: Secondary | ICD-10-CM | POA: Diagnosis not present

## 2021-06-21 DIAGNOSIS — E639 Nutritional deficiency, unspecified: Secondary | ICD-10-CM | POA: Diagnosis present

## 2021-06-21 HISTORY — DX: Atherosclerosis of native arteries of extremities with rest pain, right leg: I70.221

## 2021-06-21 HISTORY — PX: LOWER EXTREMITY ANGIOGRAPHY: CATH118251

## 2021-06-21 LAB — COMPREHENSIVE METABOLIC PANEL
ALT: 9 U/L (ref 0–44)
AST: 13 U/L — ABNORMAL LOW (ref 15–41)
Albumin: 3.3 g/dL — ABNORMAL LOW (ref 3.5–5.0)
Alkaline Phosphatase: 72 U/L (ref 38–126)
Anion gap: 7 (ref 5–15)
BUN: 13 mg/dL (ref 8–23)
CO2: 20 mmol/L — ABNORMAL LOW (ref 22–32)
Calcium: 8.5 mg/dL — ABNORMAL LOW (ref 8.9–10.3)
Chloride: 104 mmol/L (ref 98–111)
Creatinine, Ser: 0.69 mg/dL (ref 0.44–1.00)
GFR, Estimated: >60 mL/min (ref >60)
Glucose, Bld: 91 mg/dL (ref 70–99)
Potassium: 3.5 mmol/L (ref 3.5–5.1)
Sodium: 131 mmol/L — ABNORMAL LOW (ref 135–145)
Total Bilirubin: 0.3 mg/dL (ref 0.3–1.2)
Total Protein: 7.2 g/dL (ref 6.5–8.1)

## 2021-06-21 LAB — IRON AND TIBC
Iron: 24 ug/dL — ABNORMAL LOW (ref 28–170)
Saturation Ratios: 5 % — ABNORMAL LOW (ref 10.4–31.8)
TIBC: 490 ug/dL — ABNORMAL HIGH (ref 250–450)
UIBC: 466 ug/dL

## 2021-06-21 LAB — LACTIC ACID, PLASMA
Lactic Acid, Venous: 0.8 mmol/L (ref 0.5–1.9)
Lactic Acid, Venous: 0.8 mmol/L (ref 0.5–1.9)

## 2021-06-21 LAB — CBC WITH DIFFERENTIAL/PLATELET
Abs Immature Granulocytes: 0.03 10*3/uL (ref 0.00–0.07)
Basophils Absolute: 0.1 10*3/uL (ref 0.0–0.1)
Basophils Relative: 1 %
Eosinophils Absolute: 0.1 10*3/uL (ref 0.0–0.5)
Eosinophils Relative: 2 %
HCT: 25 % — ABNORMAL LOW (ref 36.0–46.0)
Hemoglobin: 6.7 g/dL — ABNORMAL LOW (ref 12.0–15.0)
Immature Granulocytes: 1 %
Lymphocytes Relative: 24 %
Lymphs Abs: 1.4 10*3/uL (ref 0.7–4.0)
MCH: 18.7 pg — ABNORMAL LOW (ref 26.0–34.0)
MCHC: 26.8 g/dL — ABNORMAL LOW (ref 30.0–36.0)
MCV: 69.6 fL — ABNORMAL LOW (ref 80.0–100.0)
Monocytes Absolute: 0.3 10*3/uL (ref 0.1–1.0)
Monocytes Relative: 5 %
Neutro Abs: 4.1 10*3/uL (ref 1.7–7.7)
Neutrophils Relative %: 67 %
Platelets: 472 10*3/uL — ABNORMAL HIGH (ref 150–400)
RBC: 3.59 MIL/uL — ABNORMAL LOW (ref 3.87–5.11)
RDW: 22.5 % — ABNORMAL HIGH (ref 11.5–15.5)
Smear Review: NORMAL
WBC: 6 10*3/uL (ref 4.0–10.5)
nRBC: 0.5 % — ABNORMAL HIGH (ref 0.0–0.2)

## 2021-06-21 LAB — CBC
HCT: 23.6 % — ABNORMAL LOW (ref 36.0–46.0)
Hemoglobin: 6.5 g/dL — ABNORMAL LOW (ref 12.0–15.0)
MCH: 18.9 pg — ABNORMAL LOW (ref 26.0–34.0)
MCHC: 27.5 g/dL — ABNORMAL LOW (ref 30.0–36.0)
MCV: 68.6 fL — ABNORMAL LOW (ref 80.0–100.0)
Platelets: 444 10*3/uL — ABNORMAL HIGH (ref 150–400)
RBC: 3.44 MIL/uL — ABNORMAL LOW (ref 3.87–5.11)
RDW: 22.2 % — ABNORMAL HIGH (ref 11.5–15.5)
WBC: 5.6 10*3/uL (ref 4.0–10.5)
nRBC: 0.4 % — ABNORMAL HIGH (ref 0.0–0.2)

## 2021-06-21 LAB — RESP PANEL BY RT-PCR (FLU A&B, COVID) ARPGX2
Influenza A by PCR: NEGATIVE
Influenza B by PCR: NEGATIVE
SARS Coronavirus 2 by RT PCR: NEGATIVE

## 2021-06-21 LAB — FOLATE: Folate: 17.4 ng/mL (ref >5.9)

## 2021-06-21 LAB — PROTIME-INR
INR: 1 (ref 0.8–1.2)
Prothrombin Time: 13.2 seconds (ref 11.4–15.2)

## 2021-06-21 LAB — LIPASE, BLOOD: Lipase: 32 U/L (ref 11–51)

## 2021-06-21 LAB — TSH: TSH: 4.295 u[IU]/mL (ref 0.350–4.500)

## 2021-06-21 LAB — GLUCOSE, CAPILLARY: Glucose-Capillary: 107 mg/dL — ABNORMAL HIGH (ref 70–99)

## 2021-06-21 LAB — APTT: aPTT: 28 seconds (ref 24–36)

## 2021-06-21 LAB — FERRITIN: Ferritin: 6 ng/mL — ABNORMAL LOW (ref 11–307)

## 2021-06-21 LAB — RETICULOCYTES
Immature Retic Fract: 24.7 % — ABNORMAL HIGH (ref 2.3–15.9)
RBC.: 3.58 MIL/uL — ABNORMAL LOW (ref 3.87–5.11)
Retic Count, Absolute: 39 10*3/uL (ref 19.0–186.0)
Retic Ct Pct: 1.1 % (ref 0.4–3.1)

## 2021-06-21 LAB — PREPARE RBC (CROSSMATCH)

## 2021-06-21 LAB — T4, FREE: Free T4: 0.78 ng/dL (ref 0.61–1.12)

## 2021-06-21 SURGERY — LOWER EXTREMITY ANGIOGRAPHY
Anesthesia: Moderate Sedation | Laterality: Right

## 2021-06-21 MED ORDER — HYDRALAZINE HCL 20 MG/ML IJ SOLN
INTRAMUSCULAR | Status: AC
  Filled 2021-06-21: qty 1

## 2021-06-21 MED ORDER — HEPARIN (PORCINE) 25000 UT/250ML-% IV SOLN
1450.0000 [IU]/h | INTRAVENOUS | Status: DC
  Administered 2021-06-22: 1150 [IU]/h via INTRAVENOUS
  Administered 2021-06-24 – 2021-06-25 (×2): 1250 [IU]/h via INTRAVENOUS
  Filled 2021-06-21 (×5): qty 250

## 2021-06-21 MED ORDER — SODIUM CHLORIDE 0.9% FLUSH
3.0000 mL | Freq: Two times a day (BID) | INTRAVENOUS | Status: DC
  Administered 2021-06-21 – 2021-06-26 (×10): 3 mL via INTRAVENOUS

## 2021-06-21 MED ORDER — HEPARIN (PORCINE) 25000 UT/250ML-% IV SOLN
INTRAVENOUS | Status: AC
Start: 2021-06-21 — End: 2021-06-21
  Administered 2021-06-21: 1000 [IU]/h via INTRAVENOUS
  Filled 2021-06-21: qty 250

## 2021-06-21 MED ORDER — MIDAZOLAM HCL 2 MG/2ML IJ SOLN
INTRAMUSCULAR | Status: AC
  Filled 2021-06-21: qty 4

## 2021-06-21 MED ORDER — HYDROMORPHONE HCL 1 MG/ML IJ SOLN: 0.5000 mg | INTRAMUSCULAR | Status: DC | PRN

## 2021-06-21 MED ORDER — PANTOPRAZOLE 80MG IVPB - SIMPLE MED
80.0000 mg | Freq: Once | INTRAVENOUS | Status: AC
  Administered 2021-06-21: 80 mg via INTRAVENOUS
  Filled 2021-06-21: qty 100

## 2021-06-21 MED ORDER — HEPARIN SODIUM (PORCINE) 1000 UNIT/ML IJ SOLN
INTRAMUSCULAR | Status: AC
  Filled 2021-06-21: qty 10

## 2021-06-21 MED ORDER — HYDROCHLOROTHIAZIDE 12.5 MG PO TABS
6.2500 mg | ORAL_TABLET | Freq: Every day | ORAL | Status: DC
  Administered 2021-06-21 – 2021-06-22 (×2): 6.25 mg via ORAL
  Filled 2021-06-21 (×2): qty 1

## 2021-06-21 MED ORDER — ACETAMINOPHEN 325 MG PO TABS
650.0000 mg | ORAL_TABLET | ORAL | Status: DC | PRN
  Administered 2021-06-25: 650 mg via ORAL
  Filled 2021-06-21: qty 2

## 2021-06-21 MED ORDER — CEFAZOLIN SODIUM-DEXTROSE 2-4 GM/100ML-% IV SOLN: 2.0000 g | INTRAVENOUS | Status: DC

## 2021-06-21 MED ORDER — PANTOPRAZOLE SODIUM 40 MG IV SOLR
40.0000 mg | Freq: Two times a day (BID) | INTRAVENOUS | Status: DC
  Filled 2021-06-21: qty 10

## 2021-06-21 MED ORDER — HEPARIN (PORCINE) 25000 UT/250ML-% IV SOLN: 1000.0000 [IU]/h | INTRAVENOUS | Status: DC

## 2021-06-21 MED ORDER — FENTANYL CITRATE (PF) 100 MCG/2ML IJ SOLN
INTRAMUSCULAR | Status: DC | PRN
  Administered 2021-06-21 (×2): 50 ug via INTRAVENOUS
  Administered 2021-06-21: 25 ug via INTRAVENOUS

## 2021-06-21 MED ORDER — FENTANYL CITRATE PF 50 MCG/ML IJ SOSY
25.0000 ug | PREFILLED_SYRINGE | INTRAMUSCULAR | Status: DC | PRN
Start: 2021-06-21 — End: 2021-06-22
  Administered 2021-06-22 (×4): 25 ug via INTRAVENOUS
  Filled 2021-06-21 (×6): qty 1

## 2021-06-21 MED ORDER — ONDANSETRON HCL 4 MG/2ML IJ SOLN
4.0000 mg | Freq: Four times a day (QID) | INTRAMUSCULAR | Status: DC | PRN
Start: 2021-06-21 — End: 2021-06-26
  Administered 2021-06-22: 4 mg via INTRAVENOUS
  Filled 2021-06-21: qty 2

## 2021-06-21 MED ORDER — HEPARIN BOLUS VIA INFUSION
4000.0000 [IU] | Freq: Once | INTRAVENOUS | Status: AC
  Administered 2021-06-21: 4000 [IU] via INTRAVENOUS
  Filled 2021-06-21: qty 4000

## 2021-06-21 MED ORDER — HYDRALAZINE HCL 20 MG/ML IJ SOLN
10.0000 mg | Freq: Four times a day (QID) | INTRAMUSCULAR | Status: DC | PRN
  Administered 2021-06-22 (×2): 10 mg via INTRAVENOUS
  Filled 2021-06-21 (×2): qty 1

## 2021-06-21 MED ORDER — HYDRALAZINE HCL 20 MG/ML IJ SOLN
INTRAMUSCULAR | Status: DC | PRN
  Administered 2021-06-21: 10 mg via INTRAVENOUS

## 2021-06-21 MED ORDER — DIPHENHYDRAMINE HCL 50 MG/ML IJ SOLN: 25.0000 mg | Freq: Once | INTRAMUSCULAR | Status: DC

## 2021-06-21 MED ORDER — SODIUM CHLORIDE 0.9 % IV SOLN: INTRAVENOUS | Status: DC

## 2021-06-21 MED ORDER — SODIUM CHLORIDE 0.9% FLUSH
3.0000 mL | INTRAVENOUS | Status: DC | PRN
Start: 2021-06-21 — End: 2021-06-26
  Administered 2021-06-23: 3 mL via INTRAVENOUS

## 2021-06-21 MED ORDER — SODIUM CHLORIDE 0.9 % IV SOLN: INTRAVENOUS | Status: AC

## 2021-06-21 MED ORDER — FENTANYL CITRATE PF 50 MCG/ML IJ SOSY
PREFILLED_SYRINGE | INTRAMUSCULAR | Status: AC
  Filled 2021-06-21: qty 1

## 2021-06-21 MED ORDER — SODIUM CHLORIDE 0.9 % IV SOLN
10.0000 mL/h | Freq: Once | INTRAVENOUS | Status: AC
  Administered 2021-06-21: 10 mL/h via INTRAVENOUS

## 2021-06-21 MED ORDER — IODIXANOL 320 MG/ML IV SOLN
INTRAVENOUS | Status: DC | PRN
  Administered 2021-06-21: 85 mL via INTRA_ARTERIAL

## 2021-06-21 MED ORDER — HEPARIN SODIUM (PORCINE) 1000 UNIT/ML IJ SOLN
INTRAMUSCULAR | Status: DC | PRN
  Administered 2021-06-21: 2000 [IU] via INTRAVENOUS

## 2021-06-21 MED ORDER — HYDRALAZINE HCL 20 MG/ML IJ SOLN
INTRAMUSCULAR | Status: AC
  Administered 2021-06-21: 10 mg via INTRAVENOUS
  Filled 2021-06-21: qty 1

## 2021-06-21 MED ORDER — SODIUM CHLORIDE 0.9 % IV SOLN: 250.0000 mL | INTRAVENOUS | Status: DC | PRN

## 2021-06-21 MED ORDER — FENTANYL CITRATE PF 50 MCG/ML IJ SOSY
PREFILLED_SYRINGE | INTRAMUSCULAR | Status: AC
  Filled 2021-06-21: qty 2

## 2021-06-21 MED ORDER — MORPHINE SULFATE (PF) 4 MG/ML IV SOLN
2.0000 mg | INTRAVENOUS | Status: DC | PRN
  Administered 2021-06-21 (×2): 2 mg via INTRAVENOUS
  Filled 2021-06-21 (×2): qty 1

## 2021-06-21 MED ORDER — MIDAZOLAM HCL 2 MG/2ML IJ SOLN
INTRAMUSCULAR | Status: DC | PRN
  Administered 2021-06-21: 1 mg via INTRAVENOUS
  Administered 2021-06-21: 2 mg via INTRAVENOUS
  Administered 2021-06-21: 1 mg via INTRAVENOUS

## 2021-06-21 SURGICAL SUPPLY — 27 items
BALLN LUTONIX DCB 6X40X130 (BALLOONS) ×2
BALLN ULTRASCOR 014 2.5X40X150 (BALLOONS) ×2
BALLN ULTRASCORE 014 2X100X150 (BALLOONS) ×2
BALLN ULTRVRSE 2X150X150 (BALLOONS) ×2
BALLOON LUTONIX DCB 6X40X130 (BALLOONS) IMPLANT
BALLOON ULTRSCR 014 2.5X40X150 (BALLOONS) IMPLANT
BALLOON ULTRSCRE 014 2X100X150 (BALLOONS) IMPLANT
BALLOON ULTRVRSE 2X150X150 (BALLOONS) IMPLANT
CANNULA 5F STIFF (CANNULA) ×1 IMPLANT
CATH 0.018 NAVICROSS ST 135 (CATHETERS) ×1 IMPLANT
CATH ANGIO 5F PIGTAIL 65CM (CATHETERS) ×1 IMPLANT
CATH VERT 5X100 (CATHETERS) ×1 IMPLANT
COVER PROBE U/S 5X48 (MISCELLANEOUS) ×1 IMPLANT
DEVICE SAFEGUARD 24CM (GAUZE/BANDAGES/DRESSINGS) ×2 IMPLANT
DEVICE STARCLOSE SE CLOSURE (Vascular Products) ×1 IMPLANT
GLIDEWIRE ADV .014X300CM (WIRE) ×1 IMPLANT
GLIDEWIRE ADV .035X260CM (WIRE) ×1 IMPLANT
KIT ENCORE 26 ADVANTAGE (KITS) ×1 IMPLANT
PACK ANGIOGRAPHY (CUSTOM PROCEDURE TRAY) ×2 IMPLANT
SHEATH BRITE TIP 5FRX11 (SHEATH) ×1 IMPLANT
SHEATH PINNACLE ST 6F 45CM (SHEATH) ×1 IMPLANT
STENT LIFESTAR 7X40X80 (Permanent Stent) IMPLANT
STENT LIFESTENT 5F 7X30X135 (Permanent Stent) ×1 IMPLANT
SYR MEDRAD MARK 7 150ML (SYRINGE) ×1 IMPLANT
TUBING CONTRAST HIGH PRESS 72 (TUBING) ×1 IMPLANT
WIRE G V18X300CM (WIRE) ×1 IMPLANT
WIRE GUIDERIGHT .035X150 (WIRE) ×1 IMPLANT

## 2021-06-21 NOTE — ED Notes (Signed)
Unable to palpate pedal pulse on right foot. Doppler used, also unable to locate pulse.

## 2021-06-21 NOTE — ED Triage Notes (Signed)
Pt comes into the ED via [facility] from home c/o right foot pain.  Pt does have discoloration in the toes.  Pt describes tenderness to the touch.  Pulses palpated in the foot per EMS.   70 HR 97% RA 178/72 132 CBG

## 2021-06-21 NOTE — ED Provider Notes (Signed)
[facility] Provider Note    Event Date/Time   First MD Initiated Contact with Patient 06/21/21 1507     (approximate)   History   Foot Pain  HPI Sydney Evans is a 71 y.o. female with a stated past medical history of multiple strokes who presents complaining of severe right foot pain.  Patient states that this pain started approximately 2 days prior to arrival and began noticing a discoloration to this right foot overnight last night.  Patient states that it is becoming more and more blue and the pain is worsening.  Patient states that she is able to walk with severe pain but endorses decreasing sensation.  Patient has only been taking over-the-counter medications for this pain that she states has not been helping.  Patient also states that she has been told in the past that she has severe carotid stenosis.     Physical Exam   Triage Vital Signs: ED Triage Vitals [06/21/21 1455]  Enc Vitals Group     BP (!) 166/68     Pulse Rate 68     Resp 18     Temp 98.7 F (37.1 C)     Temp Source Oral     SpO2 95 %     Weight 147 lb 0.8 oz (66.7 kg)     Height 5\' 2"  (1.575 m)     Head Circumference      Peak Flow      Pain Score 10     Pain Loc      Pain Edu?      Excl. in [place]?     Most recent vital signs: Vitals:   06/21/21 1455 06/21/21 1549  BP: (!) 166/68 (!) 184/97  Pulse: 68   Resp: 18 17  Temp: 98.7 F (37.1 C) 98.4 F (36.9 C)  SpO2: 95% 100%    General: Awake, oriented x4. CV:  Good peripheral perfusion.  Resp:  Normal effort.  Abd:  No distention.  Other:  Elderly Caucasian female laying in bed and in moderate distress secondary to pain.  Right foot bedside ultrasound with color Doppler shows decreased/absent flow in posterior tibial and dorsalis pedis   ED Results / Procedures / Treatments   Labs (all labs ordered are listed, but only abnormal results are displayed) Labs Reviewed  COMPREHENSIVE METABOLIC PANEL - Abnormal;  Notable for the following components:      Result Value   Sodium 131 (*)    CO2 20 (*)    Calcium 8.5 (*)    Albumin 3.3 (*)    AST 13 (*)    All other components within normal limits  CBC WITH DIFFERENTIAL/PLATELET - Abnormal; Notable for the following components:   RBC 3.59 (*)    Hemoglobin 6.7 (*)    HCT 25.0 (*)    MCV 69.6 (*)    MCH 18.7 (*)    MCHC 26.8 (*)    RDW 22.5 (*)    Platelets 472 (*)    nRBC 0.5 (*)    All other components within normal limits  RESP PANEL BY RT-PCR (FLU A&B, COVID) ARPGX2  LACTIC ACID, PLASMA  LACTIC ACID, PLASMA  APTT  PROTIME-INR  CBC  URINALYSIS, ROUTINE W REFLEX MICROSCOPIC  PREPARE RBC (CROSSMATCH)  TYPE AND SCREEN     EKG ED ECG REPORT I, Naaman Plummer, the attending physician, personally viewed and interpreted this ECG.  Date: 06/21/2021 EKG Time: 1457 Rate: 68 Rhythm: normal sinus rhythm  QRS Axis: normal Intervals: normal ST/T Wave abnormalities: normal Narrative Interpretation: no evidence of acute ischemia  PROCEDURES:  Critical Care performed: Yes, see critical care procedure note(s)  .1-3 Lead EKG Interpretation Performed by: Naaman Plummer, MD Authorized by: Naaman Plummer, MD     Interpretation: normal     ECG rate:  69   ECG rate assessment: normal     Rhythm: sinus rhythm     Ectopy: none     Conduction: normal     MEDICATIONS ORDERED IN ED: Medications  diphenhydrAMINE (BENADRYL) injection 25 mg ( Intramuscular MAR Hold 06/21/21 1553)  HYDROmorphone (DILAUDID) injection 0.5 mg ( Intravenous MAR Hold 06/21/21 1553)  ceFAZolin (ANCEF) IVPB 2g/100 mL premix ( Intravenous Automatically Held 06/22/21 0600)  heparin ADULT infusion 100 units/mL (25000 units/232mL) (1,000 Units/hr Intravenous New Bag/Given 06/21/21 1634)  heparin bolus via infusion 4,000 Units (4,000 Units Intravenous Bolus from Bag 06/21/21 1635)  0.9 %  sodium chloride infusion (10 mL/hr Intravenous New Bag/Given 06/21/21 1633)      IMPRESSION / MDM / [place] / ED COURSE  I reviewed the triage vital signs and the nursing notes.                              Differential diagnosis includes, but is not limited to, arterial occlusion, DVT, cellulitis, lower extremity fracture  The patient is on the cardiac monitor to evaluate for evidence of arrhythmia and/or significant heart rate changes.  Patient is a 70 year old female that presents for severe right foot pain and discoloration has been progressing over the last 2 nights.  On exam patient has no color Doppler flow to DP or PT and continues to have significant pain especially with palpation.  I spoke to Dr. Delana Meyer in vascular surgery who agrees with plan for IV heparin, admit to hospitalist service, and angiography.  Laboratory evaluation significant for anemia with hemoglobin of 6.7 which is down from her baseline that looks to be approximately 8.  Patient taken directly to IR suite.  Patient ordered for type and screen and 1 unit of packed red blood cells.  Dispo: Admit to medicine after IR angiography      FINAL CLINICAL IMPRESSION(S) / ED DIAGNOSES   Final diagnoses:  Pain of right lower extremity due to ischemia     Rx / DC Orders   ED Discharge Orders     None        Note:  This document was prepared using Dragon voice recognition software and may include unintentional dictation errors.   Naaman Plummer, MD 06/21/21 [phone]

## 2021-06-21 NOTE — ED Triage Notes (Signed)
Pt comes into the ED via [facility] c/o right foot pain.  Pt has cyanosis in the toes and on the side of her foot.  Pt denies any injury to the foot.  Pt states it started off with a burning hot feeling on the top of her foot and now her foot is getting discolored.

## 2021-06-21 NOTE — H&P (Signed)
History and Physical    Patient: Sydney Evans JKD:[id number] DOB: 06/23/1950 DOA: 06/21/2021 DOS: the patient was seen and examined on 06/21/2021 PCP: Jon Billings, NP  Patient coming from: Home  Chief Complaint:  Chief Complaint  Patient presents with   Foot Pain    HPI: Sydney Evans is a 71 y.o. female with medical history significant of anemia, arthritis, DVT, Barrett's, hypertension, PAD, TIA, stroke presented to the emergency room today with complaints of severe right foot pain that had started 2 days prior port patient noticed discoloration with her foot becoming more blue with decreased sensation.  And over-the-counter regimens have not been helping. Patient otherwise does not report any headaches blurred vision shortness of breath chest pain palpitations fevers chills rashes abdominal or bladder complaints.patient has no color Doppler flow to DP or PT and continues to have significant pain especially with palpation, with concern for critical limb ischemia vascular surgery was consulted and patient was taken to emergently to Cath Lab for revascularization.  Postprocedure patient had a successful stent of SFA and hospitalist requested to admit patient per vascular specialist Dr. Delana Meyer.  We have agreed to admit patient on our service and management medical problems including her anemia and hypertension.  On my evaluation of the patient she is sitting Cath Lab alert awake oriented reports pain in her right foot otherwise stable blood pressure is elevated.  I asked about patient's anemia patient reports that she has been anemic since she was 21 since she had a prolonged monoinfection.  Patient also has had iron infusions.  Patient uses ibuprofen 800 intermittently and denies any Goody's or BC use.  Discussed with patient about tobacco cessation and tobacco causing stent stenosis and closure and risk of vascular compromise.  Review of Systems:  Review of Systems  Musculoskeletal:         Rt foot pain.    Past Medical History:  Diagnosis Date   Anemia    Anxiety    Arthritis    Barrett's esophagus    Bipolar disorder ([place])    Blocked artery    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   Blood clot in vein    Cancer ([place])    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   Carotid arterial disease (HCC)    Contracture of joint of upper arm    Depression    Elevated lipids    GERD (gastroesophageal reflux disease)    Hypertension    Migraine    Osteoporosis    Poor balance    Sinus congestion    Stroke ([facility])    x 2   TIA (transient ischemic attack)    Vertigo    Past Surgical History:  Procedure Laterality Date   ABDOMINAL HYSTERECTOMY     BACK SURGERY     BREAST BIOPSY Left    CARPAL TUNNEL RELEASE Bilateral    CATARACT EXTRACTION W/ INTRAOCULAR LENS  IMPLANT, BILATERAL Bilateral    CHOLECYSTECTOMY     COLONOSCOPY WITH PROPOFOL N/A 04/09/2018   Procedure: COLONOSCOPY WITH PROPOFOL;  Surgeon: Jonathon Bellows, MD;  Location: [facility] ENDOSCOPY;  Service: Gastroenterology;  Laterality: N/A;   crystal cyst removed on left foot     ESOPHAGOGASTRODUODENOSCOPY (EGD) WITH PROPOFOL N/A 04/09/2018   Procedure: ESOPHAGOGASTRODUODENOSCOPY (EGD) WITH PROPOFOL;  Surgeon: Jonathon Bellows, MD;  Location: [facility] ENDOSCOPY;  Service: Gastroenterology;  Laterality: N/A;   ESOPHAGOGASTRODUODENOSCOPY (EGD) WITH PROPOFOL N/A 12/14/2019   Procedure: ESOPHAGOGASTRODUODENOSCOPY (EGD) WITH PROPOFOL;  Surgeon: Lesly Rubenstein, MD;  Location: [facility] ENDOSCOPY;  Service: Endoscopy;  Laterality: N/A;   EYE SURGERY     femoral fx     FOOT SURGERY     GIVENS CAPSULE STUDY N/A 06/16/2018   Procedure: GIVENS CAPSULE STUDY;  Surgeon: Jonathon Bellows, MD;  Location: [facility] END[facility]COPY;  Service: Gastroenterology;  Laterality: N/A;   JOINT REPLACEMENT     KNEE SURGERY     ORIF PERIPROSTHETIC FRACTURE Right 10/07/2019   Procedure: OPEN REDUCTION INTERNAL FIXATION (ORIF) PERIPROSTHETIC FRACTURE;  Surgeon: Corky Mull, MD;  Location: [facility] ORS;  Service: Orthopedics;   Laterality: Right;   TONSILLECTOMY     TOTAL KNEE ARTHROPLASTY Bilateral    TOTAL SHOULDER ARTHROPLASTY Left 08/01/2016   Procedure: TOTAL SHOULDER ARTHROPLASTY;  Surgeon: Corky Mull, MD;  Location: [facility] ORS;  Service: Orthopedics;  Laterality: Left;   TOTAL SHOULDER ARTHROPLASTY Right 09/30/2019   Procedure: TOTAL SHOULDER ARTHROPLASTY;  Surgeon: Corky Mull, MD;  Location: [facility] ORS;  Service: Orthopedics;  Laterality: Right;   Social History:  reports that she has been smoking cigarettes. She has been smoking an average of .5 packs per day. She has never used smokeless tobacco. She reports that she does not drink alcohol and does not use drugs.  Allergies  Allergen Reactions   Hydroxyzine Hives and Rash   Gabapentin     Dizzy and confusion   Dilaudid [Hydromorphone] Itching   Hydrocodone Hives and Rash    "terrible scratching"  Make take with benadryl   Ketorolac Rash     May take with benadryl   Percocet [Oxycodone-Acetaminophen] Itching and Rash    May take with benadryl     Toradol [Ketorolac Tromethamine] Rash    May take with benadryl    Family History  Problem Relation Age of Onset   Other Mother        ?lupus    Cancer Mother    High Cholesterol Mother    CAD Father        CABG   High Cholesterol Father    Arthritis Father    Cancer Sister    Heart murmur Sister    Heart murmur Brother    High blood pressure Other        "for everybody"   High Cholesterol Other        "for everybody"    Prior to Admission medications   Medication Sig Start Date End Date Taking? Authorizing Provider  Carboxymethylcellulose Sodium (LUBRICANT EYE DROPS OP) Place 1 drop into both eyes daily as needed (Dry eye). Systane    [provider]  citalopram (CELEXA) 40 MG tablet Take 1.5 tablets (60 mg total) by mouth daily. 04/12/21   Jon Billings, NP  diclofenac Sodium (VOLTAREN) 1 % GEL Apply 4 g topically 4 (four) times daily. 03/17/20   Johnson, Megan P, DO   diphenhydrAMINE (BENADRYL) 25 MG tablet Take 25 mg by mouth every 6 (six) hours as needed for itching.    [provider]  fluticasone (FLONASE) 50 MCG/ACT nasal spray Place 2 sprays into both nostrils daily as needed for allergies or rhinitis. 10/03/20   Jon Billings, NP  ibuprofen (ADVIL) 800 MG tablet Take by mouth. 04/01/17   [provider]  lamoTRIgine (LAMICTAL) 200 MG tablet Take 1 tablet (200 mg total) by mouth daily. 12/11/20   Jon Billings, NP  omeprazole (PRILOSEC) 40 MG capsule Take 1-2 capsules (40-80 mg total) by mouth daily. 04/27/21   Jon Billings, NP  polyethylene glycol powder (GLYCOLAX/MIRALAX) 17 GM/SCOOP powder Take 17 g by  mouth 2 (two) times daily as needed. 03/17/20   Park Liter P, DO  prochlorperazine (COMPAZINE) 10 MG tablet Take 1 tablet (10 mg total) by mouth every 6 (six) hours as needed for nausea or vomiting. 05/02/21   Blake Divine, MD  Travoprost, BAK Free, (TRAVATAN) 0.004 % SOLN ophthalmic solution Place 1 drop into both eyes at bedtime. 08/10/19   [provider]    Physical Exam: Vitals:   06/21/21 1845 06/21/21 1900 06/21/21 1919 06/21/21 1958  BP: (!) 184/77 (!) 188/75 (!) 190/82 (!) 148/86  Pulse:    83  Resp:    18  Temp:  97.9 F (36.6 C) 97.9 F (36.6 C) 97.9 F (36.6 C)  TempSrc:  Oral Oral Oral  SpO2: 100% 100% 99% 100%  Weight:      Height:      Physical Exam Vitals reviewed.  Constitutional:      General: She is not in acute distress.    Appearance: She is not ill-appearing.  HENT:     Head: Normocephalic and atraumatic.     Right Ear: External ear normal.     Left Ear: External ear normal.     Nose: Nose normal.     Mouth/Throat:     Mouth: Mucous membranes are moist.  Eyes:     Extraocular Movements: Extraocular movements intact.     Pupils: Pupils are equal, round, and reactive to light.  Neck:     Vascular: Carotid bruit present.  Cardiovascular:     Rate and Rhythm: Normal rate  and regular rhythm.     Pulses:          Dorsalis pedis pulses are 0 on the right side and 1+ on the left side.       Posterior tibial pulses are 0 on the right side.     Heart sounds: Murmur heard.  Pulmonary:     Effort: Pulmonary effort is normal.     Breath sounds: Normal breath sounds.  Abdominal:     General: Bowel sounds are normal. There is no distension.     Palpations: Abdomen is soft. There is no mass.     Tenderness: There is generalized abdominal tenderness. There is no guarding.     Hernia: No hernia is present.       Comments: Abd pain on palpation.   Neurological:     General: No focal deficit present.     Mental Status: She is alert and oriented to person, place, and time.  Psychiatric:        Mood and Affect: Mood normal.        Behavior: Behavior normal.     Data Reviewed: > CMP shows sodium of 131 normal creatinine of 0.69 albumin of 3.3 and normal LFTs. > CBC shows a white count of 5.6 hemoglobin of 6.5 MCV of 68.6 RDW 22.2 platelets of 444. > Respiratory panel negative for flu and COVID. > Chart review shows that patient has had a positive fecal occult in May 2021. 12/14/2019 upper endoscopy: - Normal esophagus. - Normal stomach. Biopsied. - Submucosal nodule found in the duodenum. Biopsied. Depending on biopsy results may need upper EUS. 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 Biopsied. 04/09/2018 colonoscopy: - The examination was otherwise normal on direct and retroflexion views. - No specimens collected. 06/16/2018: Endoscopy with GI capsule report unclear imaging unclear.  Assessment and Plan: No notes have been filed under this hospital service. 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 Heparin GTT continued. Further recommendations per Dr. Delana Meyer.  Iron deficiency anemia: Chart review shows that patient has been anemic from our records since 2018 with a  hemoglobin of 10.6. Per patient she has been anemic since the age of 40. Anemia shows severe microcytosis and consistent with iron deficiency. Etiology is unclear to me hematology consult for a.m. team. Suspect chronic GI loss from NSAID use. We will however obtain thyroid function studies, anemia panel. Patient given 1 unit of PRBC. GI consulted for severe anemia and suspected GI source.  Abdominal pain/GERD/duodenal nodule Patient does not report any abdominal pain however on exam has generalized vague abdominal pain. We will obtain CT angio of the abdomen as patient is at high risk for mesenteric ischemia I suspect patient may have chronic abdominal angina as patient has severe vascular disease, including bilateral carotid bruit.  History is difficult to discern and patient is a poor historian. We will schedule CT angio for tomorrow as patient has had contrast today with her procedure.  We will also obtain lipase LFTs and ultrasound as needed. GI has been consulted differentials also include peptic ulcer disease causing patient's abdominal pain. IV PPI therapy overnight, clear liquid diet.   Essential hypertension: Patient denies taking any blood pressure medication. With her history of vascular disease and ongoing tobacco abuse we will start patient on low-dose diuretic therapy as well for systolic hypertension. We will also continue patient on hydralazine.  We will obtain an EKG.    Advance Care Planning:   Code Status: DNR   Consults:  Dr.Schneir: vascular.  Dr. Virgina Jock: GI.  Family Communication: Joni Reining (Niece)  [phone] ([place])  Severity of Illness: The appropriate patient status for this patient is INPATIENT. Inpatient status is judged to be reasonable and necessary in order to provide the required intensity of service to ensure the patient's safety. The patient's presenting symptoms, physical exam findings, and initial radiographic and laboratory data in the  context of their chronic comorbidities is felt to place them at high risk for further clinical deterioration. Furthermore, it is not anticipated that the patient will be medically stable for discharge from the hospital within 2 midnights of admission.   * I certify that at the point of admission it is my clinical judgment that the patient will require inpatient hospital care spanning beyond 2 midnights from the point of admission due to high intensity of service, high risk for further deterioration and high frequency of surveillance required.*  Author: Para Skeans, MD 06/21/2021 8:17 PM  For on call review [email or web address].

## 2021-06-21 NOTE — Op Note (Signed)
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  Introduction catheter into right lower extremity 3rd order catheter placement               2.  Contrast injection right lower extremity for distal runoff with additional 3rd order             3.  Percutaneous transluminal angioplasty and stent placement right SFA to 6 mm                         4.  Percutaneous transluminal angioplasty right anterior tibial to 2 mm             5.  Percutaneous transluminal angioplasty right tibioperoneal trunk and the origin of the peroneal to 2.5 mm in the distal peroneal to 2 mm             6.   Star close closure left common femoral arteriotomy  Anesthesia: Conscious sedation was administered under my direct supervision by the interventional radiology RN. IV Versed plus fentanyl were utilized. Continuous ECG, pulse oximetry and blood pressure was monitored throughout the entire procedure.  Conscious sedation was for a total of 1 hour 20 minutes.  Sheath: 6 French destination left common femoral retrograde  Contrast: 85 cc  Fluoroscopy Time: 13.8 minutes  Indications:  Sydney Evans presents with increasing pain of the right foot and lower extremity.  There is absent Doppler silent the foot is cold and pale consistent with severe ischemia.  This suggests the patient is having limb threatening ischemia. The risks and benefits for angiography for limb salvage are reviewed all questions answered patient agrees to proceed.  Procedure: Sydney Evans is a 71 y.o. y.o. female who was identified and appropriate procedural time out was performed.  The patient was then placed supine on the table and prepped and  draped in the usual sterile fashion.    Ultrasound was placed in the sterile sleeve and the left groin was evaluated the left common femoral artery was echolucent and pulsatile indicating patency.  Image was recorded for the permanent record and under real-time visualization a microneedle was inserted into the common femoral artery followed by the microwire and then the micro-sheath.  A J-wire was then advanced through the micro-sheath and a  5 [place] sheath was then inserted over a J-wire. J-wire was then advanced and a 5 French pigtail catheter was positioned at the level of T12.  AP projection of the aorta was then obtained. Pigtail catheter was repositioned to above the bifurcation and a LAO view of the pelvis was obtained.  Subsequently a pigtail catheter with the stiff angle Glidewire was used to cross the aortic bifurcation the catheter wire were advanced down into the right distal external iliac artery. Oblique view of the femoral bifurcation was then obtained and subsequently the wire was reintroduced and the pigtail catheter negotiated into the SFA representing third order catheter placement. Distal runoff was then performed.  Diagnostic interpretation: The abdominal aorta is opacified with a bolus injection of contrast.  There is diffuse calcification noted but there are no hemodynamically significant stenoses noted.  Aortic bifurcation is widely patent.  Bilateral common internal  and external iliac arteries are diffusely calcified and moderately tortuous but there are no hemodynamically significant stenoses noted.  The right common femoral profunda femoris demonstrate increasing calcific disease but there are no hemodynamically significant lesions identified.  In the proximal SFA there is a 90% stenosis that extends for a distance of approximately 20 to 25 mm in length.  The midportion of the SFA is diffusely calcified but it there are no hemodynamically significant lesions.  The distal SFA through  Hunter's canal and the popliteal artery are calcified but widely patent.  Trifurcation is patent however there is profound tibial disease the anterior tibial demonstrates a subtotal occlusion in its midportion there is distal occlusion of the anterior tibial.  There is poor filling of the dorsalis pedis.  The tibioperoneal trunk is patent but the ostia of the peroneal demonstrates a greater than 90% stenosis which is focal in the mid peroneal there is a subtotal occlusion which extends over approximately 10 cm distally the peroneal is patent with the typical collateralization to the lateral plantar and the dorsalis pedis.  The posterior tibial occludes shortly after its origin and remains occluded down to the ankle.  As noted above the peroneal appears to reconstitute the plantar arteries.  5000 units of heparin was then given and allowed to circulate and a 6 [place] destination sheath was advanced up and over the bifurcation and positioned in the proximal superficial femoral artery  Kumpe catheter and advantage Glidewire were then negotiated down into the distal popliteal.  A 7 mm x 30 mm life stent is deployed across the proximal SFA lesion and postdilated with a 6 mm x 40 mm Lutonix drug-eluting balloon.  Inflation is to 8 atm for approximately 1 minute.  Follow-up imaging demonstrates less than 10% residual stenosis and attention is turned to the tibial disease.  Using a 0.014 advantage wire and a combination of the Kumpe catheter as well as a Nava cross catheter the anterior tibial is negotiated across the mid lesion as well as across the occlusion down to the level of the dorsalis pedis.  A 2 mm x 100 mm ultra score balloon is then advanced and beginning at the level of the dorsalis pedis 3 serial inflations were performed each inflation is to 8 atm for approximately 1 minute.  Follow-up imaging demonstrates the anterior tibial be patent with less than 10% residual stenosis.  Attention was then turned to  the peroneal artery.    The Kumpe catheter and the advantage wire (014 diameter) I then negotiated into the tibioperoneal trunk and then into the peroneal.  Kumpe catheter is exchanged for the [facility] cross catheter and the wire catheter negotiated down to the distal peroneal where hand-injection of contrast verifies intraluminal placement.  The 2 mm x 100 mm ultra score balloon was then advanced across the occluded segment and inflated to 8 atm for 1 minute.  A 2.5 mm x 40 mm ultra score balloon was then advanced across the ostia of the peroneal and inflated to 8 atm for 1 minute.  Follow-up imaging demonstrates less than 10% residual stenosis at the ostia however due to motion artifact, patient was moving throughout the entire procedure, there appears to be patency of the peroneal with less than 20% residual stenosis.  After review of these images the sheath is pulled into the left external iliac oblique of the common femoral is obtained and a Star close device deployed. There no immediate Complications.  Findings:   The abdominal aorta is  opacified with a bolus injection of contrast.  There is diffuse calcification noted but there are no hemodynamically significant stenoses noted.  Aortic bifurcation is widely patent.  Bilateral common internal and external iliac arteries are diffusely calcified and moderately tortuous but there are no hemodynamically significant stenoses noted.  The right common femoral profunda femoris demonstrate increasing calcific disease but there are no hemodynamically significant lesions identified.  In the proximal right SFA there is a 90% stenosis that extends for a distance of approximately 20 to 25 mm in length.  The midportion of the right SFA is diffusely calcified but it there are no hemodynamically significant lesions.  The distal right SFA through Hunter's canal and the popliteal artery are calcified but widely patent.  Trifurcation is patent however there is profound  tibial disease the right anterior tibial demonstrates a subtotal occlusion in its midportion there is distal occlusion of the anterior tibial.  There is poor filling of the dorsalis pedis.  The tibioperoneal trunk is patent but the ostia of the right peroneal demonstrates a greater than 90% stenosis which is focal in the mid peroneal there is a subtotal occlusion which extends over approximately 10 cm distally the peroneal is patent with the typical collateralization to the lateral plantar and the dorsalis pedis.  The right posterior tibial occludes shortly after its origin and remains occluded down to the ankle.  As noted above the peroneal appears to reconstitute the plantar arteries.  Following angioplasty and stent placement as described above the right superficial femoral artery is widely patent with less than 10% residual stenosis.  Following angioplasty of the right anterior tibial there is less than 10% residual stenosis.  Following angioplasty of the right peroneal and right tibioperoneal trunk as described above there appears to be patency of the right peroneal with less than 20% residual stenosis distally and less than 10% residual stenosis at its ostia.  Summary: Successful recanalization right lower extremity for limb salvage                           Disposition: Patient was taken to the recovery room in stable condition having tolerated the procedure well.  Belenda Cruise Mandolin Falwell 06/21/2021,6:54 PM

## 2021-06-21 NOTE — Interval H&P Note (Signed)
History and Physical Interval Note:  06/21/2021 5:09 PM  Sydney Evans  has presented today for surgery, with the diagnosis of ischemic right leg.  The various methods of treatment have been discussed with the patient and family. After consideration of risks, benefits and other options for treatment, the patient has consented to  Procedure(s): Lower Extremity Angiography (Right) as a surgical intervention.  The patient's history has been reviewed, patient examined, no change in status, stable for surgery.  I have reviewed the patient's chart and labs.  Questions were answered to the patient's satisfaction.     Hortencia Pilar

## 2021-06-21 NOTE — Consult Note (Signed)
[place] for IV Heparin Indication:  Limb ischemia / arterial occlusion  Patient Measurements: Height: 5\' 2"  (157.5 cm) Weight: 66.7 kg (147 lb 0.8 oz) IBW/kg (Calculated) : 50.1 Heparin Dosing Weight: 64 kg  Labs: Recent Labs    06/21/21 1500  HGB 6.7*  HCT 25.0*  PLT 472*  CREATININE 0.69    Estimated Creatinine Clearance: 60.2 mL/min (by C-G formula based on SCr of 0.69 mg/dL).   Medical History: Past Medical History:  Diagnosis Date   Anemia    Anxiety    Arthritis    Barrett's esophagus    Bipolar disorder ([place])    Blocked artery    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   Blood clot in vein    Cancer ([place])    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   Carotid arterial disease (HCC)    Contracture of joint of upper arm    Depression    Elevated lipids    GERD (gastroesophageal reflux disease)    Hypertension    Migraine    Osteoporosis    Poor balance    Sinus congestion    Stroke (HCC)    x 2   TIA (transient ischemic attack)    Vertigo     Medications:  No anticoagulation prior to admission per my chart review  Assessment: Patient is a [name] with medical history as above who presented to the ED 2/16 complaining of right foot pain with associated toe discoloration. RN un-able to locate pulses. Suspicion for arterial occlusion. Pharmacy consulted to initiate heparin infusion for arterial occlusion.   Baseline aPTT and PT-INR are pending. Baseline CBC notable for Hgb 6.7 which appears roughly c/w patient's baseline which appears to be about 7.5  Goal of Therapy:  Heparin level 0.3-0.7 units/ml Monitor platelets by anticoagulation protocol: Yes   Plan:  --Heparin 4000 unit IV bolus followed by continuous infusion at 1000 units/hr --Heparin level 6 hours after initiation of infusion --Daily CBC per protocol while on IV heparin  Update: Patient transferred for procedure prior to initiation of heparin infusion. MD aware of anemia - benefit of heparin  outweighs risk. Patient will likely also requiring blood transfusion  Benita Gutter 06/21/2021,3:54 PM

## 2021-06-21 NOTE — H&P (View-Only) (Signed)
@LOGO @   MRN : [id number]  Sydney Evans is a 71 y.o. (May 27, 1950) female who presents with chief complaint of right foot hurts.  History of Present Illness:  I am asked to evaluate the patient by Dr. Cheri Fowler.    Patient is a 71 year old woman that presented to the emergency room with complaints of painful right foot.  On evaluation her foot is cold slightly mottled and very pale.  She can no longer really move her foot on command.  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  She states symptoms began last night.  Pain has been constant and steadily worsening no alleviating factors.  Last time she ate was more than 12 hours ago.   No outpatient medications have been marked as taking for the 06/21/21 encounter [facility] Encounter).    Past Medical History:  Diagnosis Date   Anemia    Anxiety    Arthritis    Barrett's esophagus    Bipolar disorder ([place])    Blocked artery    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   Blood clot in vein    Cancer ([place])    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   Carotid arterial disease (HCC)    Contracture of joint of upper arm    Depression    Elevated lipids    GERD (gastroesophageal reflux disease)    Hypertension    Migraine    Osteoporosis    Poor balance    Sinus congestion    Stroke (HCC)    x 2   TIA (transient ischemic attack)    Vertigo     Past Surgical History:  Procedure Laterality Date   ABDOMINAL HYSTERECTOMY     BACK SURGERY     BREAST BIOPSY Left    CARPAL TUNNEL RELEASE Bilateral    CATARACT EXTRACTION W/ INTRAOCULAR LENS  IMPLANT, BILATERAL Bilateral    CHOLECYSTECTOMY     COLONOSCOPY WITH PROPOFOL N/A 04/09/2018   Procedure: COLONOSCOPY WITH PROPOFOL;  Surgeon: Jonathon Bellows, MD;  Location: [facility] ENDOSCOPY;  Service: Gastroenterology;  Laterality: N/A;   crystal cyst removed on left foot      ESOPHAGOGASTRODUODENOSCOPY (EGD) WITH PROPOFOL N/A 04/09/2018   Procedure: ESOPHAGOGASTRODUODENOSCOPY (EGD) WITH PROPOFOL;  Surgeon: Jonathon Bellows, MD;  Location: [facility] ENDOSCOPY;  Service: Gastroenterology;  Laterality: N/A;   ESOPHAGOGASTRODUODENOSCOPY (EGD) WITH PROPOFOL N/A 12/14/2019   Procedure: ESOPHAGOGASTRODUODENOSCOPY (EGD) WITH PROPOFOL;  Surgeon: Lesly Rubenstein, MD;  Location: [facility] ENDOSCOPY;  Service: Endoscopy;  Laterality: N/A;   EYE SURGERY     femoral fx     FOOT SURGERY     GIVENS CAPSULE STUDY N/A 06/16/2018   Procedure: GIVENS CAPSULE STUDY;  Surgeon: Jonathon Bellows, MD;  Location: [facility] ENDOSCOPY;  Service: Gastroenterology;  Laterality: N/A;   JOINT REPLACEMENT     KNEE SURGERY     ORIF PERIPROSTHETIC FRACTURE Right 10/07/2019   Procedure: OPEN REDUCTION INTERNAL FIXATION (ORIF) PERIPROSTHETIC FRACTURE;  Surgeon: Corky Mull, MD;  Location: [facility] ORS;  Service: Orthopedics;  Laterality: Right;   TONSILLECTOMY     TOTAL KNEE ARTHROPLASTY Bilateral    TOTAL SHOULDER ARTHROPLASTY Left 08/01/2016   Procedure: TOTAL SHOULDER ARTHROPLASTY;  Surgeon: Corky Mull, MD;  Location: [facility] ORS;  Service: Orthopedics;  Laterality: Left;   TOTAL SHOULDER ARTHROPLASTY Right 09/30/2019   Procedure: TOTAL SHOULDER ARTHROPLASTY;  Surgeon: Corky Mull, MD;  Location:  [facility] ORS;  Service: Orthopedics;  Laterality: Right;    Social History Social History   Tobacco Use   Smoking status: Every Day    Packs/day: 0.50    Types: Cigarettes    Last attempt to quit: 08/04/2016    Years since quitting: 4.8   Smokeless tobacco: Never  Vaping Use   Vaping Use: Never used  Substance Use Topics   Alcohol use: No   Drug use: No    Family History Family History  Problem Relation Age of Onset   Other Mother        ?lupus    Cancer Mother    High Cholesterol Mother    CAD Father        CABG   High Cholesterol Father    Arthritis Father    Cancer Sister    Heart murmur Sister    Heart murmur  Brother    High blood pressure Other        "for everybody"   High Cholesterol Other        "for everybody"    Allergies  Allergen Reactions   Hydroxyzine Hives and Rash   Gabapentin     Dizzy and confusion   Dilaudid [Hydromorphone] Itching   Hydrocodone Hives and Rash    "terrible scratching"  Make take with benadryl   Ketorolac Rash     May take with benadryl   Percocet [Oxycodone-Acetaminophen] Itching and Rash    May take with benadryl     Toradol [Ketorolac Tromethamine] Rash    May take with benadryl     REVIEW OF SYSTEMS (Negative unless checked)  Constitutional: [] Weight loss  [] Fever  [] Chills Cardiac: [] Chest pain   [] Chest pressure   [] Palpitations   [] Shortness of breath when laying flat   [] Shortness of breath with exertion. Vascular:  [] Pain in legs with walking   [] Pain in legs at rest  [] History of DVT   [] Phlebitis   [] Swelling in legs   [] Varicose veins   [] Non-healing ulcers Pulmonary:   [] Uses home oxygen   [] Productive cough   [] Hemoptysis   [] Wheeze  [] COPD   [] Asthma Neurologic:  [] Dizziness   [] Seizures   [] History of stroke   [] History of TIA  [] Aphasia   [] Vissual changes   [] Weakness or numbness in arm   [] Weakness or numbness in leg Musculoskeletal:   [] Joint swelling   [] Joint pain   [] Low back pain Hematologic:  [] Easy bruising  [] Easy bleeding   [] Hypercoagulable state   [] Anemic Gastrointestinal:  [] Diarrhea   [] Vomiting  [] Gastroesophageal reflux/heartburn   [] Difficulty swallowing. Genitourinary:  [] Chronic kidney disease   [] Difficult urination  [] Frequent urination   [] Blood in urine Skin:  [] Rashes   [] Ulcers  Psychological:  [] History of anxiety   []  History of major depression.  Physical Examination  Vitals:   06/21/21 1455 06/21/21 1549 06/21/21 1557 06/21/21 1700  BP: (!) 166/68 (!) 184/97    Pulse: 68     Resp: 18 17    Temp: 98.7 F (37.1 C) 98.4 F (36.9 C)    TempSrc: Oral Oral    SpO2: 95% 100%  100%  Weight: 66.7 kg   67.1 kg   Height: 5\' 2"  (1.575 m)  5\' 2"  (1.575 m)    Body mass index is 27.07 kg/m. Gen: WD/WN, NAD Head: [place]/AT, No temporalis wasting.  Ear/Nose/Throat: Hearing grossly intact, nares w/o erythema or drainage Eyes: PER, EOMI, sclera nonicteric.  Neck: Supple, no masses.  No bruit  or JVD.  Pulmonary:  Good air movement, no audible wheezing, no use of accessory muscles.  Cardiac: RRR, normal S1, S2, no Murmurs. Vascular: Foot is pasty white with no capillary refill she is unable to wiggle her toes Vessel Right Left  Radial Palpable Palpable  PT Not palpable Not palpable  DP Not palpable Not palpable  Gastrointestinal: soft, non-distended. No guarding/no peritoneal signs.  Musculoskeletal: M/S 5/5 throughout.  No visible deformity.  Neurologic: CN 2-12 intact. Pain and light touch intact in extremities.  Symmetrical.  Speech is fluent. Motor exam as listed above. Psychiatric: Judgment intact, Mood & affect appropriate for pt's clinical situation. Dermatologic: No rashes or ulcers noted.  No changes consistent with cellulitis.   CBC Lab Results  Component Value Date   WBC 6.0 06/21/2021   HGB 6.7 (L) 06/21/2021   HCT 25.0 (L) 06/21/2021   MCV 69.6 (L) 06/21/2021   PLT 472 (H) 06/21/2021    BMET    Component Value Date/Time   NA 131 (L) 06/21/2021 1500   NA 139 03/17/2020 1638   K 3.5 06/21/2021 1500   CL 104 06/21/2021 1500   CO2 20 (L) 06/21/2021 1500   GLUCOSE 91 06/21/2021 1500   BUN 13 06/21/2021 1500   BUN 9 03/17/2020 1638   CREATININE 0.69 06/21/2021 1500   CALCIUM 8.5 (L) 06/21/2021 1500   GFRNONAA >60 06/21/2021 1500   GFRAA 86 03/17/2020 1638   Estimated Creatinine Clearance: 60.5 mL/min (by C-G formula based on SCr of 0.69 mg/dL).  COAG Lab Results  Component Value Date   INR 1.2 10/03/2019   INR 0.92 11/15/2016   INR 0.91 06/26/2016    Radiology No results found.   Assessment/Plan 1.  Atherosclerotic occlusive disease bilateral lower  extremities with ischemia of the right lower extremity: Patient requires emergent intervention for limb salvage.  This was discussed with her.  She does not seem to have a very good grasp on the situation stating just take off the toes I do not need them.  I have recommended angiography with intervention for limb salvage.  Risk and benefits were reviewed.  Patient ultimately has agreed to proceed with this.  2.  Chronic anemia: Patient initially stated she would not receive blood transfusion she has signed a no transfusion statement.  However in front of multiple nurses in the preprocedural area she did say she would accept a transfusion.  We will follow a post angio hemoglobin and transfuse as needed.  However, the fact that she is starting with a hemoglobin of less than 7 precludes her from tPA infusion or surgery.  3.  Hypertension: Patient's presenting blood pressure has been anywhere from 768/115 systolic.  Again this precludes her from tPA overnight given her known atherosclerotic occlusive disease of the carotids.   4.  Carotid stenosis: Patient has known atherosclerotic occlusive disease of her carotids we will follow-up after resolution of her lower extremity ischemia to determine what further treatments are indicated.   Hortencia Pilar, MD  06/21/2021 5:01 PM

## 2021-06-21 NOTE — Consult Note (Signed)
@LOGO @   MRN : [id number]  Sydney Evans is a 71 y.o. (1950-12-05) female who presents with chief complaint of right foot hurts.  History of Present Illness:  I am asked to evaluate the patient by Dr. Cheri Fowler.    Patient is a 71 year old woman that presented to the emergency room with complaints of painful right foot.  On evaluation her foot is cold slightly mottled and very pale.  She can no longer really move her foot on command.  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  She states symptoms began last night.  Pain has been constant and steadily worsening no alleviating factors.  Last time she ate was more than 12 hours ago.   No outpatient medications have been marked as taking for the 06/21/21 encounter [facility] Encounter).    Past Medical History:  Diagnosis Date   Anemia    Anxiety    Arthritis    Barrett's esophagus    Bipolar disorder ([place])    Blocked artery    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   Blood clot in vein    Cancer ([place])    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   Carotid arterial disease (HCC)    Contracture of joint of upper arm    Depression    Elevated lipids    GERD (gastroesophageal reflux disease)    Hypertension    Migraine    Osteoporosis    Poor balance    Sinus congestion    Stroke (HCC)    x 2   TIA (transient ischemic attack)    Vertigo     Past Surgical History:  Procedure Laterality Date   ABDOMINAL HYSTERECTOMY     BACK SURGERY     BREAST BIOPSY Left    CARPAL TUNNEL RELEASE Bilateral    CATARACT EXTRACTION W/ INTRAOCULAR LENS  IMPLANT, BILATERAL Bilateral    CHOLECYSTECTOMY     COLONOSCOPY WITH PROPOFOL N/A 04/09/2018   Procedure: COLONOSCOPY WITH PROPOFOL;  Surgeon: Jonathon Bellows, MD;  Location: [facility] ENDOSCOPY;  Service: Gastroenterology;  Laterality: N/A;   crystal cyst removed on left foot      ESOPHAGOGASTRODUODENOSCOPY (EGD) WITH PROPOFOL N/A 04/09/2018   Procedure: ESOPHAGOGASTRODUODENOSCOPY (EGD) WITH PROPOFOL;  Surgeon: Jonathon Bellows, MD;  Location: [facility] ENDOSCOPY;  Service: Gastroenterology;  Laterality: N/A;   ESOPHAGOGASTRODUODENOSCOPY (EGD) WITH PROPOFOL N/A 12/14/2019   Procedure: ESOPHAGOGASTRODUODENOSCOPY (EGD) WITH PROPOFOL;  Surgeon: Lesly Rubenstein, MD;  Location: [facility] ENDOSCOPY;  Service: Endoscopy;  Laterality: N/A;   EYE SURGERY     femoral fx     FOOT SURGERY     GIVENS CAPSULE STUDY N/A 06/16/2018   Procedure: GIVENS CAPSULE STUDY;  Surgeon: Jonathon Bellows, MD;  Location: [facility] ENDOSCOPY;  Service: Gastroenterology;  Laterality: N/A;   JOINT REPLACEMENT     KNEE SURGERY     ORIF PERIPROSTHETIC FRACTURE Right 10/07/2019   Procedure: OPEN REDUCTION INTERNAL FIXATION (ORIF) PERIPROSTHETIC FRACTURE;  Surgeon: Corky Mull, MD;  Location: [facility] ORS;  Service: Orthopedics;  Laterality: Right;   TONSILLECTOMY     TOTAL KNEE ARTHROPLASTY Bilateral    TOTAL SHOULDER ARTHROPLASTY Left 08/01/2016   Procedure: TOTAL SHOULDER ARTHROPLASTY;  Surgeon: Corky Mull, MD;  Location: [facility] ORS;  Service: Orthopedics;  Laterality: Left;   TOTAL SHOULDER ARTHROPLASTY Right 09/30/2019   Procedure: TOTAL SHOULDER ARTHROPLASTY;  Surgeon: Corky Mull, MD;  Location:  [facility] ORS;  Service: Orthopedics;  Laterality: Right;    Social History Social History   Tobacco Use   Smoking status: Every Day    Packs/day: 0.50    Types: Cigarettes    Last attempt to quit: 08/04/2016    Years since quitting: 4.8   Smokeless tobacco: Never  Vaping Use   Vaping Use: Never used  Substance Use Topics   Alcohol use: No   Drug use: No    Family History Family History  Problem Relation Age of Onset   Other Mother        ?lupus    Cancer Mother    High Cholesterol Mother    CAD Father        CABG   High Cholesterol Father    Arthritis Father    Cancer Sister    Heart murmur Sister    Heart murmur  Brother    High blood pressure Other        "for everybody"   High Cholesterol Other        "for everybody"    Allergies  Allergen Reactions   Hydroxyzine Hives and Rash   Gabapentin     Dizzy and confusion   Dilaudid [Hydromorphone] Itching   Hydrocodone Hives and Rash    "terrible scratching"  Make take with benadryl   Ketorolac Rash     May take with benadryl   Percocet [Oxycodone-Acetaminophen] Itching and Rash    May take with benadryl     Toradol [Ketorolac Tromethamine] Rash    May take with benadryl     REVIEW OF SYSTEMS (Negative unless checked)  Constitutional: [] Weight loss  [] Fever  [] Chills Cardiac: [] Chest pain   [] Chest pressure   [] Palpitations   [] Shortness of breath when laying flat   [] Shortness of breath with exertion. Vascular:  [] Pain in legs with walking   [] Pain in legs at rest  [] History of DVT   [] Phlebitis   [] Swelling in legs   [] Varicose veins   [] Non-healing ulcers Pulmonary:   [] Uses home oxygen   [] Productive cough   [] Hemoptysis   [] Wheeze  [] COPD   [] Asthma Neurologic:  [] Dizziness   [] Seizures   [] History of stroke   [] History of TIA  [] Aphasia   [] Vissual changes   [] Weakness or numbness in arm   [] Weakness or numbness in leg Musculoskeletal:   [] Joint swelling   [] Joint pain   [] Low back pain Hematologic:  [] Easy bruising  [] Easy bleeding   [] Hypercoagulable state   [] Anemic Gastrointestinal:  [] Diarrhea   [] Vomiting  [] Gastroesophageal reflux/heartburn   [] Difficulty swallowing. Genitourinary:  [] Chronic kidney disease   [] Difficult urination  [] Frequent urination   [] Blood in urine Skin:  [] Rashes   [] Ulcers  Psychological:  [] History of anxiety   []  History of major depression.  Physical Examination  Vitals:   06/21/21 1455 06/21/21 1549 06/21/21 1557 06/21/21 1700  BP: (!) 166/68 (!) 184/97    Pulse: 68     Resp: 18 17    Temp: 98.7 F (37.1 C) 98.4 F (36.9 C)    TempSrc: Oral Oral    SpO2: 95% 100%  100%  Weight: 66.7 kg   67.1 kg   Height: 5\' 2"  (1.575 m)  5\' 2"  (1.575 m)    Body mass index is 27.07 kg/m. Gen: WD/WN, NAD Head: [place]/AT, No temporalis wasting.  Ear/Nose/Throat: Hearing grossly intact, nares w/o erythema or drainage Eyes: PER, EOMI, sclera nonicteric.  Neck: Supple, no masses.  No bruit  or JVD.  Pulmonary:  Good air movement, no audible wheezing, no use of accessory muscles.  Cardiac: RRR, normal S1, S2, no Murmurs. Vascular: Foot is pasty white with no capillary refill she is unable to wiggle her toes Vessel Right Left  Radial Palpable Palpable  PT Not palpable Not palpable  DP Not palpable Not palpable  Gastrointestinal: soft, non-distended. No guarding/no peritoneal signs.  Musculoskeletal: M/S 5/5 throughout.  No visible deformity.  Neurologic: CN 2-12 intact. Pain and light touch intact in extremities.  Symmetrical.  Speech is fluent. Motor exam as listed above. Psychiatric: Judgment intact, Mood & affect appropriate for pt's clinical situation. Dermatologic: No rashes or ulcers noted.  No changes consistent with cellulitis.   CBC Lab Results  Component Value Date   WBC 6.0 06/21/2021   HGB 6.7 (L) 06/21/2021   HCT 25.0 (L) 06/21/2021   MCV 69.6 (L) 06/21/2021   PLT 472 (H) 06/21/2021    BMET    Component Value Date/Time   NA 131 (L) 06/21/2021 1500   NA 139 03/17/2020 1638   K 3.5 06/21/2021 1500   CL 104 06/21/2021 1500   CO2 20 (L) 06/21/2021 1500   GLUCOSE 91 06/21/2021 1500   BUN 13 06/21/2021 1500   BUN 9 03/17/2020 1638   CREATININE 0.69 06/21/2021 1500   CALCIUM 8.5 (L) 06/21/2021 1500   GFRNONAA >60 06/21/2021 1500   GFRAA 86 03/17/2020 1638   Estimated Creatinine Clearance: 60.5 mL/min (by C-G formula based on SCr of 0.69 mg/dL).  COAG Lab Results  Component Value Date   INR 1.2 10/03/2019   INR 0.92 11/15/2016   INR 0.91 06/26/2016    Radiology No results found.   Assessment/Plan 1.  Atherosclerotic occlusive disease bilateral lower  extremities with ischemia of the right lower extremity: Patient requires emergent intervention for limb salvage.  This was discussed with her.  She does not seem to have a very good grasp on the situation stating just take off the toes I do not need them.  I have recommended angiography with intervention for limb salvage.  Risk and benefits were reviewed.  Patient ultimately has agreed to proceed with this.  2.  Chronic anemia: Patient initially stated she would not receive blood transfusion she has signed a no transfusion statement.  However in front of multiple nurses in the preprocedural area she did say she would accept a transfusion.  We will follow a post angio hemoglobin and transfuse as needed.  However, the fact that she is starting with a hemoglobin of less than 7 precludes her from tPA infusion or surgery.  3.  Hypertension: Patient's presenting blood pressure has been anywhere from 824/235 systolic.  Again this precludes her from tPA overnight given her known atherosclerotic occlusive disease of the carotids.   4.  Carotid stenosis: Patient has known atherosclerotic occlusive disease of her carotids we will follow-up after resolution of her lower extremity ischemia to determine what further treatments are indicated.   Hortencia Pilar, MD  06/21/2021 5:01 PM

## 2021-06-21 NOTE — Progress Notes (Addendum)
Pt Right lower extremity toes starts to turn  purplle, blue and black compare to previous admisison and is cool to touch and no trace of pulse on right . Pt also is complaining of 10/10 pain. MD Schnier made aware. Will continue to monitor.  Update 2053: Spoke with Dr Delana Meyer about pt concerns and if MD can assess pt at bedside but per MD Schnier theres nothing that can be done at this point and cant be at bedside to assess pt. Pt msade aware.Pt will be starts on Hep gtt at 2100  Update 2136: Pt blood transfusion complated. Pt tolerated well. Will continue to monitor.  Update 2348: Pt still complaining of pain 10/10 even after 2 mg morphine at 2304. Pt completed blood transfusion at 2136 but no repeat check order after blood transfusion Pt hgb at 6.5 prior to transfusion. NP Randol Kern amde aware. Will continue to monitor.  Update 2352: NP Randol Kern placed order for Fentanyl 25 mcg IV every hour PRN for moderate pain and discontinue morphine. Will continue to monitor.  Update 0023: Pt manual BP at 180/85 HR 71. Spoke with MD Posey Pronto but no new order place. Will continue.  Update 0033: Pt is requesting something to help with sleep. NP Randol Kern made aware. Will continue to monitor.  Update 0036: NP Randol Kern ordered Xanax 0.25 mg tablet oral once. Will continue to monitor.  Update 0104: Pt NPO but have xanax order once. NP Randol Kern made aware.  Update 0122: NP Randol Kern order to give xanax once and a dose of fentanyl 25 mcg IV once. Will continue to monitor.  Update 0211: MD Patle ordered to switch from NPO to NPO except sip with medicine. Will continue to monitor.

## 2021-06-22 ENCOUNTER — Encounter: Payer: Self-pay | Admitting: Vascular Surgery

## 2021-06-22 ENCOUNTER — Inpatient Hospital Stay: Payer: Medicare Other

## 2021-06-22 LAB — TYPE AND SCREEN
ABO/RH(D): O POS
Antibody Screen: NEGATIVE
Unit division: 0

## 2021-06-22 LAB — VITAMIN B12: Vitamin B-12: 190 pg/mL (ref 180–914)

## 2021-06-22 LAB — CBC
HCT: 25.8 % — ABNORMAL LOW (ref 36.0–46.0)
Hemoglobin: 7.3 g/dL — ABNORMAL LOW (ref 12.0–15.0)
MCH: 20.1 pg — ABNORMAL LOW (ref 26.0–34.0)
MCHC: 28.3 g/dL — ABNORMAL LOW (ref 30.0–36.0)
MCV: 71.1 fL — ABNORMAL LOW (ref 80.0–100.0)
Platelets: 357 10*3/uL (ref 150–400)
RBC: 3.63 MIL/uL — ABNORMAL LOW (ref 3.87–5.11)
RDW: 23.4 % — ABNORMAL HIGH (ref 11.5–15.5)
WBC: 5.6 10*3/uL (ref 4.0–10.5)
nRBC: 0.4 % — ABNORMAL HIGH (ref 0.0–0.2)

## 2021-06-22 LAB — HEPARIN LEVEL (UNFRACTIONATED)
Heparin Unfractionated: 0.37 IU/mL (ref 0.30–0.70)
Heparin Unfractionated: 0.26 IU/mL — ABNORMAL LOW (ref 0.30–0.70)
Heparin Unfractionated: 0.59 IU/mL (ref 0.30–0.70)

## 2021-06-22 LAB — BPAM RBC
Blood Product Expiration Date: 202303212359
ISSUE DATE / TIME: 202302161858
Unit Type and Rh: 5100

## 2021-06-22 LAB — HEMOGLOBIN AND HEMATOCRIT, BLOOD
HCT: 28.9 % — ABNORMAL LOW (ref 36.0–46.0)
HCT: 26.2 % — ABNORMAL LOW (ref 36.0–46.0)
Hemoglobin: 8.2 g/dL — ABNORMAL LOW (ref 12.0–15.0)
Hemoglobin: 7.5 g/dL — ABNORMAL LOW (ref 12.0–15.0)

## 2021-06-22 LAB — PHOSPHORUS: Phosphorus: 3.2 mg/dL (ref 2.5–4.6)

## 2021-06-22 LAB — GAMMA GT: GGT: 17 U/L (ref 7–50)

## 2021-06-22 LAB — MAGNESIUM: Magnesium: 1.8 mg/dL (ref 1.7–2.4)

## 2021-06-22 LAB — HIV ANTIBODY (ROUTINE TESTING W REFLEX): HIV Screen 4th Generation wRfx: NONREACTIVE

## 2021-06-22 LAB — GLUCOSE, CAPILLARY: Glucose-Capillary: 98 mg/dL (ref 70–99)

## 2021-06-22 MED ORDER — DIPHENHYDRAMINE HCL 25 MG PO CAPS
25.0000 mg | ORAL_CAPSULE | Freq: Four times a day (QID) | ORAL | Status: DC | PRN
  Administered 2021-06-22 – 2021-06-25 (×10): 25 mg via ORAL
  Filled 2021-06-22 (×10): qty 1

## 2021-06-22 MED ORDER — FENTANYL CITRATE PF 50 MCG/ML IJ SOSY
25.0000 ug | PREFILLED_SYRINGE | Freq: Once | INTRAMUSCULAR | Status: AC
  Administered 2021-06-22: 25 ug via INTRAVENOUS
  Filled 2021-06-22: qty 1

## 2021-06-22 MED ORDER — AMLODIPINE BESYLATE 5 MG PO TABS
5.0000 mg | ORAL_TABLET | Freq: Every day | ORAL | Status: DC
  Administered 2021-06-22 – 2021-06-25 (×4): 5 mg via ORAL
  Filled 2021-06-22 (×4): qty 1

## 2021-06-22 MED ORDER — PANTOPRAZOLE SODIUM 40 MG IV SOLR
40.0000 mg | Freq: Every day | INTRAVENOUS | Status: DC
  Administered 2021-06-23 – 2021-06-25 (×3): 40 mg via INTRAVENOUS
  Filled 2021-06-22 (×4): qty 10

## 2021-06-22 MED ORDER — FAMOTIDINE 20 MG PO TABS
40.0000 mg | ORAL_TABLET | Freq: Every day | ORAL | Status: DC
  Administered 2021-06-22 – 2021-06-25 (×4): 40 mg via ORAL
  Filled 2021-06-22 (×4): qty 2

## 2021-06-22 MED ORDER — SODIUM CHLORIDE 0.9 % IV SOLN
200.0000 mg | INTRAVENOUS | Status: AC
  Administered 2021-06-22 – 2021-06-26 (×5): 200 mg via INTRAVENOUS
  Filled 2021-06-22: qty 10
  Filled 2021-06-22 (×5): qty 200

## 2021-06-22 MED ORDER — CYANOCOBALAMIN 1000 MCG/ML IJ SOLN
1000.0000 ug | Freq: Every day | INTRAMUSCULAR | Status: DC
  Administered 2021-06-22 – 2021-06-26 (×5): 1000 ug via INTRAMUSCULAR
  Filled 2021-06-22 (×6): qty 1

## 2021-06-22 MED ORDER — IOHEXOL 350 MG/ML SOLN
80.0000 mL | Freq: Once | INTRAVENOUS | Status: AC | PRN
  Administered 2021-06-22: 80 mL via INTRAVENOUS

## 2021-06-22 MED ORDER — HEPARIN BOLUS VIA INFUSION
950.0000 [IU] | Freq: Once | INTRAVENOUS | Status: AC
  Administered 2021-06-22: 950 [IU] via INTRAVENOUS
  Filled 2021-06-22: qty 950

## 2021-06-22 MED ORDER — ALPRAZOLAM 0.25 MG PO TABS
0.2500 mg | ORAL_TABLET | Freq: Once | ORAL | Status: AC
  Administered 2021-06-22: 0.25 mg via ORAL
  Filled 2021-06-22: qty 1

## 2021-06-22 MED ORDER — VITAMIN B-12 1000 MCG PO TABS: 500.0000 ug | ORAL_TABLET | Freq: Every day | ORAL | Status: DC

## 2021-06-22 MED ORDER — HYDROMORPHONE HCL 2 MG PO TABS
2.0000 mg | ORAL_TABLET | ORAL | Status: DC | PRN
  Administered 2021-06-22 – 2021-06-26 (×16): 2 mg via ORAL
  Filled 2021-06-22 (×16): qty 1

## 2021-06-22 NOTE — Consult Note (Signed)
GI Inpatient Consult Note  Reason for Consult: Anemia   Attending Requesting Consult: Dr. Florina Ou  History of Present Illness: Sydney Evans is a 71 y.o. female seen for evaluation of anemia at the request of Dr. Posey Pronto. 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 Patient presented to the [facility] ED  yesterday for chief complaint of right foot pain with discoloration. Upon presentation to the ED, vital signs were 166/68, 68, 18, 98.7. Labs were significant for Hgb 6.7, Hct 25, MCV 69.6, Plt 472, ferritin 6.  Imaging study shows mild stenosis at the origin of the celiac and inferior mesenteric arteries. No significant narrowing of the superior mesenteric artery. No significant abnormality of the mesenteric veins.NON-VASCULAR :Mild colonic diverticulosis. Unchanged mild intrahepatic biliary dilatation, predominately in the left hepatic lobe.  GI is consulted for evaluation and management of anemia   Last Colonoscopy: 04/09/18: IDA: diverticulosis, normal otherwise. FH Father and uncles had colon cancer  Last Endoscopy: 04/09/18 : LNL:GXQJJ small angiectasias without bleeding were found in the duodenal bulb and in the third portion of the duodenum. Coagulation for bleeding prevention using argon plasma  04/10/18 : MR enterogram, sigmoid diverticulosis.  03/16/18 : C diff negative,normal fecal calprotectin, H pylori breath test, Anti parietal cell ab, IF ab-negative 06/16/2018: Endoscopy with GI capsule:IDA: polypoid lesion ampulla: suggest push enteroscopy to r/o polyp in duo/jejunum  Last EGD 12/14/2019: - EGD: Nodule in duodenal sweep. 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   She reports her right foot has shown  improvement since she has been hospitalized. She had recanalization of the right lower extremity for limb salvage performed 06/21/2021.  She remains on heparin infusion. She has chronic iron deficiency anemia with extensive GI evaluation as summarized above.  There were  plans to do a repeat EGD for follow-up of the early metaplastic atrophic gastritis as well as colonoscopy in Dec 2022. This was canceled because she had Covid.She has not felt well enough to reschedule.   Patient has a history of iron deficiency anemia and monoclonal gammopathy previously evaluated by [facility] and subsequently by Dr. Tasia Catchings in 2019.  She received IV iron infusions in 2019 and was started on ferrous sulfate 325 mg twice daily with meal.  She does not take this iron.  She has declined repeat IV iron infusions as she said they did not for work to get her HGB up. She has not been back to see Dr. Tasia Catchings.  Her hemoglobin remains very low and GI work up as summarized above- has not found a source for ongoing active GI blood loss. Hemoglobin on 05/01/2021 was 7.6, MCV 63.6, RDW 21.8, platelet 183.  She presents on admission with hemoglobin 6.7.  She has subsequently received 1 unit PRBC with hemoglobin 7.3, hematocrit 25.8.   The patient reports no upper or lower GI complaints.  She has been off of ibuprofen until 2 weeks ago when her foot was hurting so bad that she started to take ibuprofen 3 times a day a few times a week in addition to Tylenol. She denies any abdominal pain, heartburn, reflux, or vomiting.  She will have slight spells of nausea which is her baseline.  She has no change in bowel habits. No hematochezia. Her stools were darker than  normal a couple days ago, but not black.  Normally they are a lighter brown. No Pepto or oral iron use.   Past Medical History:  Past Medical History:  Diagnosis Date   Anemia    Anxiety    Arthritis    Barrett's esophagus    Bipolar disorder ([place])    Blocked artery    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    Blood clot in vein    Cancer ([place])    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   Carotid arterial disease ([place])    Contracture of joint of upper arm    Depression    Elevated lipids    GERD (gastroesophageal reflux disease)    Hypertension    Migraine    Osteoporosis    Poor balance    Sinus congestion    Stroke ([place])    x 2   TIA (transient ischemic attack)    Vertigo     Problem List: Patient Active Problem List   Diagnosis Date Noted   Critical limb ischemia of right lower extremity ([place]) 06/21/2021   Sleep disturbance 10/06/2020   Fall 09/18/2020   Duodenal nodule 03/17/2020   Bipolar depression ([place]) 11/17/2019   Anxiety 11/17/2019   Abdominal pain, generalized    Traumatic complete tear of right rotator cuff 10/08/2019   Hypokalemia 10/07/2019   Hypomagnesemia 10/07/2019   Dislocation of right shoulder joint 10/06/2019   NSVT (nonsustained ventricular tachycardia)    Ileus ([place]) 10/03/2019   Status post total shoulder arthroplasty, 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   Post-concussion headache 01/20/2018   Iron deficiency anemia 10/05/2017   Carotid stenosis 11/17/2016   GERD (gastroesophageal reflux disease) 11/17/2016   Essential hypertension 11/17/2016   Hyperlipidemia 11/17/2016   Status post total shoulder replacement, 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   Rotator cuff tendinitis, left 05/20/2016   History of TIAs 07/17/2015   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   TIA (transient ischemic attack) 12/23/2014   Localized, 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   Disorder of bone and articular cartilage 09/29/2014    Past Surgical History: Past Surgical History:  Procedure Laterality Date   ABDOMINAL HYSTERECTOMY     BACK SURGERY     BREAST BIOPSY Left    CARPAL TUNNEL RELEASE Bilateral    CATARACT EXTRACTION W/ INTRAOCULAR LENS  IMPLANT, BILATERAL Bilateral    CHOLECYSTECTOMY     COLONOSCOPY WITH PROPOFOL N/A 04/09/2018   Procedure: COLONOSCOPY WITH PROPOFOL;  Surgeon: Jonathon Bellows, MD;   Location: [facility] ENDOSCOPY;  Service: Gastroenterology;  Laterality: N/A;   crystal cyst removed on left foot     ESOPHAGOGASTRODUODENOSCOPY (EGD) WITH PROPOFOL N/A 04/09/2018   Procedure: ESOPHAGOGASTRODUODENOSCOPY (EGD) WITH PROPOFOL;  Surgeon: Jonathon Bellows, MD;  Location: [facility] ENDOSCOPY;  Service: Gastroenterology;  Laterality: N/A;   ESOPHAGOGASTRODUODENOSCOPY (EGD) WITH PROPOFOL N/A 12/14/2019   Procedure: ESOPHAGOGASTRODUODENOSCOPY (EGD) WITH PROPOFOL;  Surgeon: Lesly Rubenstein, MD;  Location: [facility] ENDOSCOPY;  Service: Endoscopy;  Laterality: N/A;   EYE SURGERY     femoral fx     FOOT SURGERY     GIVENS CAPSULE STUDY N/A 06/16/2018   Procedure: GIVENS CAPSULE STUDY;  Surgeon: Jonathon Bellows, MD;  Location: [facility] ENDOSCOPY;  Service: Gastroenterology;  Laterality: N/A;   JOINT REPLACEMENT     KNEE SURGERY     ORIF PERIPROSTHETIC FRACTURE Right 10/07/2019   Procedure: OPEN REDUCTION INTERNAL FIXATION (ORIF) PERIPROSTHETIC FRACTURE;  Surgeon: Corky Mull, MD;  Location: [facility] ORS;  Service: Orthopedics;  Laterality: Right;   TONSILLECTOMY     TOTAL  KNEE ARTHROPLASTY Bilateral    TOTAL SHOULDER ARTHROPLASTY Left 08/01/2016   Procedure: TOTAL SHOULDER ARTHROPLASTY;  Surgeon: Corky Mull, MD;  Location: [facility] ORS;  Service: Orthopedics;  Laterality: Left;   TOTAL SHOULDER ARTHROPLASTY Right 09/30/2019   Procedure: TOTAL SHOULDER ARTHROPLASTY;  Surgeon: Corky Mull, MD;  Location: [facility] ORS;  Service: Orthopedics;  Laterality: Right;    Allergies: Allergies  Allergen Reactions   Hydroxyzine Hives and Rash   Gabapentin     Dizzy and confusion   Dilaudid [Hydromorphone] Itching   Hydrocodone Hives and Rash    "terrible scratching"  Make take with benadryl   Ketorolac Rash     May take with benadryl   Percocet [Oxycodone-Acetaminophen] Itching and Rash    May take with benadryl     Toradol [Ketorolac Tromethamine] Rash    May take with benadryl    Home Medications: Medications Prior to  Admission  Medication Sig Dispense Refill Last Dose   amoxicillin-clavulanate (AUGMENTIN) 875-125 MG tablet Take 1 tablet by mouth 2 (two) times daily.      benzonatate (TESSALON) 200 MG capsule Take 200 mg by mouth 3 (three) times daily as needed.      Carboxymethylcellulose Sodium (LUBRICANT EYE DROPS OP) Place 1 drop into both eyes daily as needed (Dry eye). Systane      citalopram (CELEXA) 40 MG tablet Take 1.5 tablets (60 mg total) by mouth daily. 90 tablet 0    diclofenac Sodium (VOLTAREN) 1 % GEL Apply 4 g topically 4 (four) times daily. (Patient not taking: Reported on 06/22/2021) 350 g 5 Completed Course   diphenhydrAMINE (BENADRYL) 25 MG tablet Take 25 mg by mouth every 6 (six) hours as needed for itching.      fluticasone (FLONASE) 50 MCG/ACT nasal spray Place 2 sprays into both nostrils daily as needed for allergies or rhinitis. 16 g 12    ibuprofen (ADVIL) 800 MG tablet Take by mouth.      lamoTRIgine (LAMICTAL) 200 MG tablet Take 1 tablet (200 mg total) by mouth daily. 90 tablet 0    nortriptyline (PAMELOR) 25 MG capsule Take 25 mg by mouth at bedtime.      omeprazole (PRILOSEC) 40 MG capsule Take 1-2 capsules (40-80 mg total) by mouth daily. 180 capsule 0    omeprazole (PRILOSEC) 40 MG capsule Take 40 mg by mouth daily before breakfast. Take 30 minutes before breakfast.      polyethylene glycol powder (GLYCOLAX/MIRALAX) 17 GM/SCOOP powder Take 17 g by mouth 2 (two) times daily as needed. (Patient not taking: Reported on 06/22/2021) 3350 g 1 Completed Course   prochlorperazine (COMPAZINE) 10 MG tablet Take 1 tablet (10 mg total) by mouth every 6 (six) hours as needed for nausea or vomiting. (Patient not taking: Reported on 06/22/2021) 10 tablet 0 Completed Course   propranolol (INDERAL) 20 MG tablet Take 20 mg by mouth 2 (two) times daily.      Travoprost, BAK Free, (TRAVATAN) 0.004 % SOLN ophthalmic solution Place 1 drop into both eyes at bedtime.      Home medication reconciliation  was completed with the patient.   Scheduled Inpatient Medications:    hydrALAZINE       hydrochlorothiazide  6.25 mg Oral Daily   [START ON 06/25/2021] pantoprazole  40 mg Intravenous Q12H   sodium chloride flush  3 mL Intravenous Q12H    Continuous Inpatient Infusions:    sodium chloride     heparin 1,000 Units/hr (06/21/21 2104)  PRN Inpatient Medications:  sodium chloride, acetaminophen, fentaNYL (SUBLIMAZE) injection, hydrALAZINE, ondansetron (ZOFRAN) IV, sodium chloride flush  Family History: family history includes Arthritis in her father; CAD in her father; Cancer in her mother and sister; Heart murmur in her brother and sister; High Cholesterol in her father, mother, and another family member; High blood pressure in an other family member; Other in her mother.  The patient's family history is negative for inflammatory bowel disorders, GI malignancy, or solid organ transplantation.  Social History:   reports that she has been smoking cigarettes. She has been smoking an average of .5 packs per day. She has never used smokeless tobacco. She reports that she does not drink alcohol and does not use drugs. The patient denies ETOH, tobacco, or drug use.   Review of Systems: Constitutional: Weight is stable.  Eyes: No changes in vision. ENT: No oral lesions, sore throat.  GI: see HPI.  Heme/Lymph: No easy bruising.  CV: No chest pain.  GU: No hematuria.  Integumentary: No rashes.  Neuro: No headaches.  Psych: No depression/anxiety.  Endocrine: No heat/cold intolerance.  Allergic/Immunologic: No urticaria.  Resp: No cough, SOB.     Physical Examination: BP 137/74    Pulse (!) 0    Temp 97.8 F (36.6 C) (Oral)    Resp 20    Ht 5\' 2"  (1.575 m)    Wt 67.1 kg    SpO2 100%    BMI 27.07 kg/m  Gen: NAD, alert and oriented x 4 HEENT: Anicteric Neck: supple, no JVD or thyromegaly Chest: CTA bilaterally, cough  CV: RRR, no m/g/c/r Abd: soft, NT, ND, +BS in all four quadrants;  no HSM, guarding, rigidity, or rebound tenderness Ext: no edema, Right foot with pain and discoloration of toes post revascularization.  Skin: no rash or lesions noted Lymph: no LAD  Data: Lab Results  Component Value Date   WBC 5.6 06/22/2021   HGB 7.3 (L) 06/22/2021   HCT 25.8 (L) 06/22/2021   MCV 71.1 (L) 06/22/2021   PLT 357 06/22/2021   Recent Labs  Lab 06/21/21 1500 06/21/21 1616 06/22/21 0258  HGB 6.7* 6.5* 7.3*   Lab Results  Component Value Date   NA 131 (L) 06/21/2021   K 3.5 06/21/2021   CL 104 06/21/2021   CO2 20 (L) 06/21/2021   BUN 13 06/21/2021   CREATININE 0.69 06/21/2021   Lab Results  Component Value Date   ALT 9 06/21/2021   AST 13 (L) 06/21/2021   GGT 17 06/21/2021   ALKPHOS 72 06/21/2021   BILITOT 0.3 06/21/2021   Recent Labs  Lab 06/21/21 1616  APTT 28  INR 1.0   Assessment/Plan: Sydney Evans is a 71 y.o. 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 Patient presented to the [facility] ED  yesterday for chief complaint of right foot pain with discoloration. She had recanalization of the right lower extremity for limb salvage performed 06/21/2021.  She remains on heparin infusion. Her admission labs were significant for Hgb 6.7 down from 7.6 on  05/01/2021  with known severe IDA.  She has subsequently received 1 unit PRBC with hemoglobin 7.3, hematocrit 25.8.  She is hemodynamically stable.  Patient underwent CT angio of the abdomen and pelvis this morning that showed no significant narrowing of the superior mesenteric artery.  Has mild colonic diverticulosis.  She has no upper or lower GI complaints.  Patient reports dark brown stool a few days ago and denies black melena.  She has shown no overt signs of GI bleed and vital signs remained stable.  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 06/11/17 : MR enterogram, sigmoid diverticulosis.   06/16/2018: Endoscopy with GI capsule:IDA: polypoid lesion ampulla: suggest push enteroscopy to r/o polyp in duo/jejunum  03/07/2020: EUS: Impression: EGD Impression:- Normal esophagus, stomach.- Submucosal nodule found in the duodenum consistent with lipoma and no specimens obtained.   Repeat EGD in  2021 revealed nodule in duodenal sweep. 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  She is currently on IV heparin.  Recommend outpatient evaluation with EGD and  colonoscopy when clinically feasible.   Recommend outpatient consultation with hematology to continue work on increasing her iron storage.  Patient has been resistant to this in the past, but with discussion today, she reports she is agreeable to further follow-up and management recommendations.   Thank you for the consult. Please call with questions or concerns.  Denice Paradise, [facility] [phone]

## 2021-06-22 NOTE — Progress Notes (Signed)
[facility] Progress Note  Patient: Sydney Evans    CNO:[id number]  DOA: 06/21/2021     Date of Service: the patient was seen and examined on 06/22/2021  Chief Complaint  Patient presents with   Foot Pain   Brief hospital course: Lorre Opdahl is a 71 y.o. female with medical history significant of anemia, arthritis, DVT, Barrett's, hypertension, PAD, TIA, stroke presented to the emergency room today with complaints of severe right foot pain that had started 2 days prior port patient noticed discoloration with her foot becoming more blue with decreased sensation.  And over-the-counter regimens have not been helping. patient has no color Doppler flow to DP or PT and continues to have significant pain especially with palpation, with concern for critical limb ischemia vascular surgery was consulted and patient was taken to emergently to Cath Lab for revascularization. Postprocedure patient had a successful stent of SFA  Patient was admitted under hospitalist service, GI was consulted for chronic anemia and iron deficiency.  Further management as below.   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 Heparin GTT continued. IV fentanyl as needed as per vascular surgery Further recommendations per Dr. Delana Meyer.    Iron deficiency anemia: Chart review shows that patient has been anemic from our records since 2018 with a hemoglobin of 10.6. Per patient she has been anemic since the age of 51. Anemia shows severe microcytosis and consistent with iron deficiency. Patient received multiple iron infusions in the past, recommend to follow with hematology as an outpatient.   Suspect chronic GI loss from NSAID use. GI consulted for severe anemia and suspected GI source.  Recommended outpatient follow-up for EGD and colonoscopy thyroid functions wnl, anemia panel iron sat 5% and B12 190 low. Hb 6.5, s/p 1 unit of PRBC.  7.3 infusion, repeat Hb  8.2 Started Venofer 200 mg IV daily during hospital stay followed by oral iron supplement with vitamin C. Vitamin B12 level 190, target >400, started vitamin B12 1000 mcg IM injection during hospital stay followed by oral supplement. Follow with PCP to repeat iron profile and vitamin B12 level after 3 months Check stool sample for GI pathogen to rule out any parasitic infection   Abdominal pain/GERD/duodenal nodule Patient does not report any abdominal pain however on exam has generalized vague abdominal pain.  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 No significant narrowing of the superior mesenteric artery. No significant abnormality of the mesenteric veins. Mild colonic diverticulosis.  Unchanged mild intrahepatic biliary dilatation, predominately in the left hepatic lobe. Continue PPI 40 mg IV twice daily Resumed diet, we will follow for tolerance      Essential hypertension: Patient denies taking any blood pressure medication. With her history of vascular disease and ongoing tobacco abuse we will start patient on  Amlodipine 5 mg p.o. daily. continue hydralazine IV prn.  EKG NSR.   Hyponatremia sodium 131, we will continue to monitor most likely nutritional deficiency.  Body mass index is 27.07 kg/m.  Interventions:       Diet: Regular diet DVT Prophylaxis: Therapeutic Anticoagulation with heparin IV infusion    Advance goals of care discussion: DNR  Family Communication: family was NOT present at bedside, at the time of interview.  The pt provided permission to discuss medical plan with the family. Opportunity was given to ask question and all questions were answered satisfactorily.   Disposition:  Pt is from  home, admitted with right foot ischemia s/p SFA stent on IV heparin infusion, still has significant pain on IV fentanyl, which precludes a safe discharge. Discharge to home, when when clinically stable, may  require few days more to improve.  Subjective: Overnight patient was admitted due to critical right foot ischemic changes, SFA stent was done by vascular surgery, today morning patient is still complaining of severe pain 10/10, initially patient refused IV iron infusion but then agreed.  Hemoglobin is improving.  Patient denied any chest pain or palpitation, no any other active issues except pain in the right foot.  Physical Exam: General:  alert oriented to time, place, and person.  Appear in mild distress, affect appropriate Eyes: PERRLA ENT: Oral Mucosa Clear, moist  Neck: no JVD,  Cardiovascular: S1 and S2 Present, no Murmur,  Respiratory: good respiratory effort, Bilateral Air entry equal and Decreased, no Crackles, no wheezes Abdomen: Bowel Sound present, Soft and no tenderness,  Skin: no rashes Extremities: no Pedal edema, no calf tenderness, right foot cold, tender to touch, no erythema or any sign of infection Neurologic: without any new focal findings Gait not checked due to patient safety concerns  Vitals:   06/22/21 0649 06/22/21 0654 06/22/21 0755 06/22/21 1226  BP:   (!) 151/71 (!) 162/74  Pulse: (!) 0 (!) 0 79 70  Resp:    17  Temp:   98 F (36.7 C) 97.8 F (36.6 C)  TempSrc:      SpO2:   99% 99%  Weight:      Height:        Intake/Output Summary (Last 24 hours) at 06/22/2021 1447 Last data filed at 06/22/2021 0700 Gross per 24 hour  Intake 678.35 ml  Output 800 ml  Net -121.65 ml   Filed Weights   06/21/21 1455 06/21/21 1557  Weight: 66.7 kg 67.1 kg    Data Reviewed: I have personally reviewed and interpreted daily labs, tele strips, imagings as discussed above. I reviewed all nursing notes, pharmacy notes, vitals, pertinent old records I have discussed plan of care as described above with RN and patient/family.  CBC: Recent Labs  Lab 06/21/21 1500 06/21/21 1616 06/22/21 0258 06/22/21 1152  WBC 6.0 5.6 5.6  --   NEUTROABS 4.1  --   --   --   HGB  6.7* 6.5* 7.3* 8.2*  HCT 25.0* 23.6* 25.8* 28.9*  MCV 69.6* 68.6* 71.1*  --   PLT 472* 444* 357  --    Basic Metabolic Panel: Recent Labs  Lab 06/21/21 1500 06/22/21 0859  NA 131*  --   K 3.5  --   CL 104  --   CO2 20*  --   GLUCOSE 91  --   BUN 13  --   CREATININE 0.69  --   CALCIUM 8.5*  --   MG  --  1.8  PHOS  --  3.2    Studies: PERIPHERAL VASCULAR CATHETERIZATION  Result Date: 06/21/2021 See surgical note for result.  CT Angio Abd/Pel w/ and/or w/o  Result Date: 06/22/2021 CLINICAL DATA:  Mesenteric ischemia Post EXAM: CTA ABDOMEN AND PELVIS WITHOUT AND WITH CONTRAST TECHNIQUE: Multidetector CT imaging of the abdomen and pelvis was performed using the standard protocol during bolus administration of intravenous contrast. Multiplanar reconstructed images and MIPs were obtained and reviewed to evaluate the vascular anatomy. RADIATION DOSE REDUCTION: This exam was performed according to the departmental dose-optimization program which includes automated exposure control, adjustment of the mA and/or kV  according to patient size and/or use of iterative reconstruction technique. CONTRAST:  1mL OMNIPAQUE IOHEXOL 350 MG/ML SOLN COMPARISON:  CT abdomen pelvis with contrast 05/02/2021 FINDINGS: VASCULAR Aorta: Atherosclerotic calcifications seen throughout the abdominal aorta without aneurysm or flow-limiting stenosis. Celiac: Gastrosplenic trunk is patent. There is mild stenosis at the origin. SMA: No significant stenosis. The common hepatic artery is replaced originating from the superior insert artery. Renals: No significant stenosis of the renal arteries. IMA: Mild narrowing at the origin.  Otherwise patent. Inflow: Mild narrowing at the origin of the left common iliac artery due to calcified plaque. Inflow segments otherwise unremarkable. Proximal Outflow: Right SFA stent partially visualized. Atherosclerotic calcifications are seen in the common femoral arteries without flow-limiting  stenosis. Veins: Hepatic, portal, superior mesenteric, and splenic veins are patent. Review of the MIP images confirms the above findings. NON-VASCULAR Lower chest: Mild bibasilar atelectasis. 4 mm subpleural nodule opacity in the left lateral lung base (24/8) is not significantly changed since 10/02/2019 which indicates a benign etiology. Hepatobiliary: Postsurgical changes of cholecystectomy. Mild intrahepatic biliary dilatation, particularly left hepatic lobe is not significantly changed dating back to 10/02/2019. liver otherwise unremarkable. Pancreas: Unremarkable. No pancreatic ductal dilatation or surrounding inflammatory changes. Spleen: Normal in size without focal abnormality. Adrenals/Urinary Tract: Mild diffuse thickening of the adrenal glands without discrete nodularity. No significant abnormality of the kidneys, ureters, or bladder. Stomach/Bowel: Scattered colonic diverticula. No bowel dilatation to indicate ileus or obstruction. Lymphatic: No enlarged abdominal or pelvic lymph nodes. Reproductive: Status post hysterectomy. No adnexal masses. Other: Small amount of subcutaneous emphysema in the left inguinal region likely related to recent vascular intervention of the right lower extremity. No fluid collection identified in the left inguinal soft tissues to indicate seroma or abscess. Musculoskeletal: Vertebral augmentation changes partially visualized in the T10 vertebral body. Degenerative changes seen throughout the lower lumbar spine. No acute osseous abnormality. IMPRESSION: VASCULAR 1. Mild stenosis at the origin of the celiac and inferior mesenteric arteries. No significant narrowing of the superior mesenteric artery. 2. No significant abnormality of the mesenteric veins. NON-VASCULAR 1. Mild colonic diverticulosis. 2. Unchanged mild intrahepatic biliary dilatation, predominately in the left hepatic lobe. Electronically Signed   By: Miachel Roux M.D.   On: 06/22/2021 09:37    Scheduled Meds:   cyanocobalamin  1,000 mcg Intramuscular Daily   Followed by   Derrill Memo ON 06/29/2021] vitamin B-12  500 mcg Oral Daily   hydrochlorothiazide  6.25 mg Oral Daily   [START ON 06/25/2021] pantoprazole  40 mg Intravenous Q12H   sodium chloride flush  3 mL Intravenous Q12H   Continuous Infusions:  sodium chloride     heparin 1,150 Units/hr (06/22/21 1323)   iron sucrose 200 mg (06/22/21 1236)   PRN Meds: sodium chloride, acetaminophen, fentaNYL (SUBLIMAZE) injection, hydrALAZINE, ondansetron (ZOFRAN) IV, sodium chloride flush  Time spent: 35 minutes  Author: Val Riles. MD [facility] 06/22/2021 2:47 PM  To reach On-call, see care teams to locate the attending and reach out to them via [email or web address]. If 7PM-7AM, please contact night-coverage If you still have difficulty reaching the attending provider, please page the [facility] (Director on Call) for [facility] on amion for assistance.

## 2021-06-22 NOTE — Progress Notes (Signed)
[facility]  Daily Progress Note   Subjective  -   Postop day 1 Procedure(s) Performed:             1.  Introduction catheter into right lower extremity 3rd order catheter placement               2.  Contrast injection right lower extremity for distal runoff with additional 3rd order             3.  Percutaneous transluminal angioplasty and stent placement right SFA to 6 mm                         4.  Percutaneous transluminal angioplasty right anterior tibial to 2 mm             5.  Percutaneous transluminal angioplasty right tibioperoneal trunk and the origin of the peroneal to 2.5 mm in the distal peroneal to 2 mm             6.   Star close closure left common femoral arteriotomy  Sydney Evans is a 71 year old female that presented to the emergency room with complaints of a painful right foot.  The foot was cold, mottled and pale.  She was unable to move her foot on command.  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  It was also subsequently noted that the patient was profoundly anemic with a hemoglobin of 6.7.  Following her revascularization the patient's foot is warm however she has significant pain and tenderness just to the touch.  She is also received 1 unit of packed red blood cells.  Objective Vitals:   06/22/21 0649 06/22/21 0654 06/22/21 0755 06/22/21 1226  BP:   (!) 151/71 (!) 162/74  Pulse: (!) 0 (!) 0 79 70  Resp:    17  Temp:   98 F (36.7 C) 97.8 F (36.6 C)  TempSrc:      SpO2:   99% 99%  Weight:      Height:        Intake/Output Summary (Last 24 hours) at 06/22/2021 1451 Last data filed at 06/22/2021 0700 Gross per 24 hour  Intake 678.35 ml  Output 800 ml  Net -121.65 ml    PULM  CTAB CV  RRR VASC  foot is warm, however toes are dusky with discoloration.  Groin site is soft no evidence of hematoma  Laboratory CBC    Component Value  Date/Time   WBC 5.6 06/22/2021 0258   HGB 8.2 (L) 06/22/2021 1152   HGB 9.1 (L) 03/17/2020 1638   HCT 28.9 (L) 06/22/2021 1152   HCT 30.8 (L) 03/17/2020 1638   PLT 357 06/22/2021 0258   PLT 396 03/17/2020 1638    BMET    Component Value Date/Time   NA 131 (L) 06/21/2021 1500   NA 139 03/17/2020 1638   K 3.5 06/21/2021 1500   CL 104 06/21/2021 1500   CO2 20 (L) 06/21/2021 1500   GLUCOSE 91 06/21/2021 1500   BUN 13 06/21/2021 1500   BUN 9 03/17/2020 1638   CREATININE 0.69 06/21/2021 1500   CALCIUM 8.5 (L) 06/21/2021 1500   GFRNONAA >60 06/21/2021 1500   GFRAA 86 03/17/2020 1638    Assessment/Planning: POD #1 s/p right lower extremity angiogram  Right lower extremity ischemia Following revascularization last night the patient's foot is warm however  she is still having significant pain in the forefoot area.  I discussed with the patient that she has significant microvascular disease that is not amenable to endovascular intervention.  There is also the possibility that due to her significant ischemia she is having reperfusion discomfort as well.  I also suspect her profound anemia may be having an effect on this as well.  We also discussed the fact that despite revascularization, given her severe microvascular disease  amputation may still be necessary however we will monitor her foot post revascularization to see if there is improvement or if further intervention will be necessary.  Sydney Evans  06/22/2021, 2:51 PM

## 2021-06-22 NOTE — Plan of Care (Signed)
°  Problem: Education: Goal: Knowledge of General Education information will improve Description: Including pain rating scale, medication(s)/side effects and non-pharmacologic comfort measures Outcome: Progressing   Problem: Clinical Measurements: Goal: Will remain free from infection Outcome: Progressing   Problem: Activity: Goal: Risk for activity intolerance will decrease Outcome: Progressing   Problem: Pain Managment: Goal: General experience of comfort will improve Outcome: Progressing   Problem: Skin Integrity: Goal: Risk for impaired skin integrity will decrease Outcome: Progressing

## 2021-06-22 NOTE — Progress Notes (Signed)
°   06/22/21 1500  Clinical Encounter Type  Visited With Patient and family together  Visit Type Initial  Referral From Nurse  Consult/Referral To Chaplain   Chaplain responded to nurse consult. Chaplain visited with patient and granddaughter. Patient was anxious and frustrated about pain and hospital stay. Patient has family support and resources. Chaplain provided compassionate presence and reflective listening. Patient appreciated [place] visit.

## 2021-06-22 NOTE — Consult Note (Signed)
Sydney Evans for IV Heparin Indication:  Limb ischemia / arterial occlusion  Patient Measurements: Height: 5\' 2"  (157.5 cm) Weight: 67.1 kg (148 lb) IBW/kg (Calculated) : 50.1 Heparin Dosing Weight: 64 kg  Labs: Recent Labs    06/21/21 1500 06/21/21 1616 06/22/21 0258 06/22/21 0859 06/22/21 1152 06/22/21 1651  HGB 6.7* 6.5* 7.3*  --  8.2*  --   HCT 25.0* 23.6* 25.8*  --  28.9*  --   PLT 472* 444* 357  --   --   --   APTT  --  28  --   --   --   --   LABPROT  --  13.2  --   --   --   --   INR  --  1.0  --   --   --   --   HEPARINUNFRC  --   --  0.37 0.26*  --  0.59  CREATININE 0.69  --   --   --   --   --      Estimated Creatinine Clearance: 60.5 mL/min (by C-G formula based on SCr of 0.69 mg/dL).   Medical History: Past Medical History:  Diagnosis Date   Anemia    Anxiety    Arthritis    Barrett's esophagus    Bipolar disorder ([place])    Blocked artery    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   Blood clot in vein    Cancer ([place])    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   Carotid arterial disease (HCC)    Contracture of joint of upper arm    Depression    Elevated lipids    GERD (gastroesophageal reflux disease)    Hypertension    Migraine    Osteoporosis    Poor balance    Sinus congestion    Stroke (HCC)    x 2   TIA (transient ischemic attack)    Vertigo     Medications:  No anticoagulation prior to admission per my chart review  Assessment: Patient is a [name] with medical history as above who presented to the ED 2/16 complaining of right foot pain with associated toe discoloration. RN un-able to locate pulses. Suspicion for arterial occlusion. Pharmacy consulted to initiate heparin infusion for arterial occlusion.   Baseline aPTT 28 and PT-INR 1.0. Baseline CBC notable for Hgb 6.7 which appears roughly c/w patient's baseline which appears to be about 7.5  Date/HL HL Rate/comment 2/17 @ 0258 0.33 Therapeutic; 1,000 units/hr 2/17 @ 0859 0.26 Subtherapeutic;  1150 units/hr 2/17 @1651  0.59 Therapeutic.    Goal of Therapy:  Heparin level 0.3-0.7 units/ml Monitor platelets by anticoagulation protocol: Yes   Plan:  Heparin level is therapeutic. Will continue heparin infusion at 1150 units/hr. Recheck heparin level in 6 hours. CBC daily while on heparin.    Oswald Hillock, PharmD 06/22/2021 5:35 PM

## 2021-06-22 NOTE — Consult Note (Signed)
[place] for IV Heparin Indication:  Limb ischemia / arterial occlusion  Patient Measurements: Height: 5\' 2"  (157.5 cm) Weight: 67.1 kg (148 lb) IBW/kg (Calculated) : 50.1 Heparin Dosing Weight: 64 kg  Labs: Recent Labs    06/21/21 1500 06/21/21 1616 06/22/21 0258  HGB 6.7* 6.5* 7.3*  HCT 25.0* 23.6* 25.8*  PLT 472* 444* 357  APTT  --  28  --   LABPROT  --  13.2  --   INR  --  1.0  --   HEPARINUNFRC  --   --  0.37  CREATININE 0.69  --   --      Estimated Creatinine Clearance: 60.5 mL/min (by C-G formula based on SCr of 0.69 mg/dL).   Medical History: Past Medical History:  Diagnosis Date   Anemia    Anxiety    Arthritis    Barrett's esophagus    Bipolar disorder ([place])    Blocked artery    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   Blood clot in vein    Cancer ([place])    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   Carotid arterial disease (HCC)    Contracture of joint of upper arm    Depression    Elevated lipids    GERD (gastroesophageal reflux disease)    Hypertension    Migraine    Osteoporosis    Poor balance    Sinus congestion    Stroke (HCC)    x 2   TIA (transient ischemic attack)    Vertigo     Medications:  No anticoagulation prior to admission per my chart review  Assessment: Patient is a [name] with medical history as above who presented to the ED 2/16 complaining of right foot pain with associated toe discoloration. RN un-able to locate pulses. Suspicion for arterial occlusion. Pharmacy consulted to initiate heparin infusion for arterial occlusion.   Baseline aPTT and PT-INR are pending. Baseline CBC notable for Hgb 6.7 which appears roughly c/w patient's baseline which appears to be about 7.5  Goal of Therapy:  Heparin level 0.3-0.7 units/ml Monitor platelets by anticoagulation protocol: Yes   Plan:  2/17:  HL @ 0258 = 0.33, therapeutic X 1 Will continue pt on current rate and recheck HL in 6 hrs on 2/17 @ 0900.   Update: Patient transferred for  procedure prior to initiation of heparin infusion. MD aware of anemia - benefit of heparin outweighs risk. Patient will likely also requiring blood transfusion  Zaiden Ludlum D 06/22/2021,3:30 AM

## 2021-06-22 NOTE — Consult Note (Signed)
[place] for IV Heparin Indication:  Limb ischemia / arterial occlusion  Patient Measurements: Height: 5\' 2"  (157.5 cm) Weight: 67.1 kg (148 lb) IBW/kg (Calculated) : 50.1 Heparin Dosing Weight: 64 kg  Labs: Recent Labs    06/21/21 1500 06/21/21 1616 06/22/21 0258 06/22/21 0859  HGB 6.7* 6.5* 7.3*  --   HCT 25.0* 23.6* 25.8*  --   PLT 472* 444* 357  --   APTT  --  28  --   --   LABPROT  --  13.2  --   --   INR  --  1.0  --   --   HEPARINUNFRC  --   --  0.37 0.26*  CREATININE 0.69  --   --   --      Estimated Creatinine Clearance: 60.5 mL/min (by C-G formula based on SCr of 0.69 mg/dL).   Medical History: Past Medical History:  Diagnosis Date   Anemia    Anxiety    Arthritis    Barrett's esophagus    Bipolar disorder ([place])    Blocked artery    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   Blood clot in vein    Cancer ([place])    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   Carotid arterial disease (HCC)    Contracture of joint of upper arm    Depression    Elevated lipids    GERD (gastroesophageal reflux disease)    Hypertension    Migraine    Osteoporosis    Poor balance    Sinus congestion    Stroke (HCC)    x 2   TIA (transient ischemic attack)    Vertigo     Medications:  No anticoagulation prior to admission per my chart review  Assessment: Patient is a [name] with medical history as above who presented to the ED 2/16 complaining of right foot pain with associated toe discoloration. RN un-able to locate pulses. Suspicion for arterial occlusion. Pharmacy consulted to initiate heparin infusion for arterial occlusion.   Baseline aPTT 28 and PT-INR 1.0. Baseline CBC notable for Hgb 6.7 which appears roughly c/w patient's baseline which appears to be about 7.5  Date/HL HL Rate/comment 2/17 @ 0258 0.33 Therapeutic; 1,000 units/hr 2/17 @ 0859 0.26 Subtherapeutic; 1150 units/hr   Goal of Therapy:  Heparin level 0.3-0.7 units/ml Monitor platelets by anticoagulation  protocol: Yes   Plan: Heparin level subtherapeutic. Bolus 950 units Increase heparin infusion to 1150 units/hr HL in 6 hours Daily CBC, HL.   Wynelle Cleveland, PharmD Pharmacy Resident  06/22/2021 10:37 AM

## 2021-06-23 LAB — CBC
HCT: 27.3 % — ABNORMAL LOW (ref 36.0–46.0)
Hemoglobin: 7.9 g/dL — ABNORMAL LOW (ref 12.0–15.0)
MCH: 20.4 pg — ABNORMAL LOW (ref 26.0–34.0)
MCHC: 28.9 g/dL — ABNORMAL LOW (ref 30.0–36.0)
MCV: 70.4 fL — ABNORMAL LOW (ref 80.0–100.0)
Platelets: 341 10*3/uL (ref 150–400)
RBC: 3.88 MIL/uL (ref 3.87–5.11)
RDW: 23.3 % — ABNORMAL HIGH (ref 11.5–15.5)
WBC: 5.3 10*3/uL (ref 4.0–10.5)
nRBC: 0.6 % — ABNORMAL HIGH (ref 0.0–0.2)

## 2021-06-23 LAB — BASIC METABOLIC PANEL
Anion gap: 14 (ref 5–15)
Anion gap: 6 (ref 5–15)
BUN: 9 mg/dL (ref 8–23)
BUN: 9 mg/dL (ref 8–23)
CO2: 19 mmol/L — ABNORMAL LOW (ref 22–32)
CO2: 23 mmol/L (ref 22–32)
Calcium: 8.9 mg/dL (ref 8.9–10.3)
Calcium: 8.7 mg/dL — ABNORMAL LOW (ref 8.9–10.3)
Chloride: 99 mmol/L (ref 98–111)
Chloride: 97 mmol/L — ABNORMAL LOW (ref 98–111)
Creatinine, Ser: 0.52 mg/dL (ref 0.44–1.00)
Creatinine, Ser: 0.61 mg/dL (ref 0.44–1.00)
GFR, Estimated: >60 mL/min (ref >60)
GFR, Estimated: >60 mL/min (ref >60)
Glucose, Bld: 68 mg/dL — ABNORMAL LOW (ref 70–99)
Glucose, Bld: 112 mg/dL — ABNORMAL HIGH (ref 70–99)
Potassium: 3 mmol/L — ABNORMAL LOW (ref 3.5–5.1)
Potassium: 3.7 mmol/L (ref 3.5–5.1)
Sodium: 132 mmol/L — ABNORMAL LOW (ref 135–145)
Sodium: 126 mmol/L — ABNORMAL LOW (ref 135–145)

## 2021-06-23 LAB — MAGNESIUM: Magnesium: 2 mg/dL (ref 1.7–2.4)

## 2021-06-23 LAB — PHOSPHORUS: Phosphorus: 3.6 mg/dL (ref 2.5–4.6)

## 2021-06-23 LAB — HEMOGLOBIN AND HEMATOCRIT, BLOOD
HCT: 26 % — ABNORMAL LOW (ref 36.0–46.0)
Hemoglobin: 7.5 g/dL — ABNORMAL LOW (ref 12.0–15.0)

## 2021-06-23 LAB — GLUCOSE, CAPILLARY
Glucose-Capillary: 103 mg/dL — ABNORMAL HIGH (ref 70–99)
Glucose-Capillary: 69 mg/dL — ABNORMAL LOW (ref 70–99)

## 2021-06-23 LAB — HEPARIN LEVEL (UNFRACTIONATED)
Heparin Unfractionated: 0.62 IU/mL (ref 0.30–0.70)
Heparin Unfractionated: 0.55 IU/mL (ref 0.30–0.70)

## 2021-06-23 MED ORDER — SODIUM BICARBONATE 650 MG PO TABS
650.0000 mg | ORAL_TABLET | Freq: Three times a day (TID) | ORAL | Status: DC
  Administered 2021-06-23 (×3): 650 mg via ORAL
  Filled 2021-06-23 (×3): qty 1

## 2021-06-23 MED ORDER — BISACODYL 5 MG PO TBEC
10.0000 mg | DELAYED_RELEASE_TABLET | Freq: Every day | ORAL | Status: DC | PRN
  Administered 2021-06-24 – 2021-06-25 (×2): 10 mg via ORAL
  Filled 2021-06-23 (×2): qty 2

## 2021-06-23 MED ORDER — POLYETHYLENE GLYCOL 3350 17 G PO PACK
17.0000 g | PACK | Freq: Every day | ORAL | Status: DC
  Administered 2021-06-23 – 2021-06-25 (×3): 17 g via ORAL
  Filled 2021-06-23 (×3): qty 1

## 2021-06-23 MED ORDER — SODIUM BICARBONATE 650 MG PO TABS
650.0000 mg | ORAL_TABLET | Freq: Three times a day (TID) | ORAL | Status: DC
Start: 2021-06-23 — End: 2021-06-23

## 2021-06-23 MED ORDER — NITROGLYCERIN 0.2 MG/HR TD PT24
0.2000 mg | MEDICATED_PATCH | Freq: Every day | TRANSDERMAL | Status: DC
Start: 2021-06-23 — End: 2021-06-26
  Administered 2021-06-23 – 2021-06-26 (×4): 0.2 mg via TRANSDERMAL
  Filled 2021-06-23 (×4): qty 1

## 2021-06-23 MED ORDER — POTASSIUM CHLORIDE 10 MEQ/100ML IV SOLN
10.0000 meq | INTRAVENOUS | Status: AC
  Administered 2021-06-23 (×6): 10 meq via INTRAVENOUS
  Filled 2021-06-23 (×6): qty 100

## 2021-06-23 MED ORDER — FENTANYL CITRATE PF 50 MCG/ML IJ SOSY
12.5000 ug | PREFILLED_SYRINGE | INTRAMUSCULAR | Status: DC | PRN
  Administered 2021-06-23: 12.5 ug via INTRAVENOUS
  Filled 2021-06-23: qty 1

## 2021-06-23 MED ORDER — NALOXONE HCL 0.4 MG/ML IJ SOLN: 0.4000 mg | INTRAMUSCULAR | Status: DC | PRN

## 2021-06-23 NOTE — Consult Note (Signed)
[place] for IV Heparin Indication:  Limb ischemia / arterial occlusion  Patient Measurements: Height: 5\' 2"  (157.5 cm) Weight: 67.1 kg (148 lb) IBW/kg (Calculated) : 50.1 Heparin Dosing Weight: 64 kg  Labs: Recent Labs    06/21/21 1500 06/21/21 1616 06/21/21 1616 06/22/21 0258 06/22/21 0859 06/22/21 1152 06/22/21 1651 06/22/21 2244  HGB 6.7* 6.5*  --  7.3*  --  8.2*  --  7.5*  HCT 25.0* 23.6*  --  25.8*  --  28.9*  --  26.2*  PLT 472* 444*  --  357  --   --   --   --   APTT  --  28  --   --   --   --   --   --   LABPROT  --  13.2  --   --   --   --   --   --   INR  --  1.0  --   --   --   --   --   --   HEPARINUNFRC  --   --    < > 0.37 0.26*  --  0.59 0.62  CREATININE 0.69  --   --   --   --   --   --   --    < > = values in this interval not displayed.     Estimated Creatinine Clearance: 60.5 mL/min (by C-G formula based on SCr of 0.69 mg/dL).   Medical History: Past Medical History:  Diagnosis Date   Anemia    Anxiety    Arthritis    Barrett's esophagus    Bipolar disorder ([place])    Blocked artery    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   Blood clot in vein    Cancer ([place])    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   Carotid arterial disease (HCC)    Contracture of joint of upper arm    Depression    Elevated lipids    GERD (gastroesophageal reflux disease)    Hypertension    Migraine    Osteoporosis    Poor balance    Sinus congestion    Stroke (HCC)    x 2   TIA (transient ischemic attack)    Vertigo     Medications:  No anticoagulation prior to admission per my chart review  Assessment: Patient is a [name] with medical history as above who presented to the ED 2/16 complaining of right foot pain with associated toe discoloration. RN un-able to locate pulses. Suspicion for arterial occlusion. Pharmacy consulted to initiate heparin infusion for arterial occlusion.   Baseline aPTT 28 and PT-INR 1.0. Baseline CBC notable for Hgb 6.7 which appears roughly  c/w patient's baseline which appears to be about 7.5  Date/HL HL Rate/comment 2/17 @ 0258 0.33 Therapeutic; 1,000 units/hr 2/17 @ 0859 0.26 Subtherapeutic; 1150 units/hr 2/17 @1651  0.59 Therapeutic.  2/17 @2244     0.62     Therapeutic X 2    Goal of Therapy:  Heparin level 0.3-0.7 units/ml Monitor platelets by anticoagulation protocol: Yes   Plan:  2/17:  HL @ 2244 = 0.62, therapeutic X 2 Will continue pt on current rate and recheck HL on 2/18 with AM labs.  Helmut Hennon D, PharmD 06/23/2021 12:18 AM

## 2021-06-23 NOTE — Progress Notes (Signed)
[facility] Progress Note  Patient: Sydney Evans    WUX:[id number]  DOA: 06/21/2021     Date of Service: the patient was seen and examined on 06/23/2021  Chief Complaint  Patient presents with   Foot Pain   Brief hospital course: Willean Schurman is a 71 y.o. female with medical history significant of anemia, arthritis, DVT, Barrett's, hypertension, PAD, TIA, stroke presented to the emergency room today with complaints of severe right foot pain that had started 2 days prior port patient noticed discoloration with her foot becoming more blue with decreased sensation.  And over-the-counter regimens have not been helping. patient has no color Doppler flow to DP or PT and continues to have significant pain especially with palpation, with concern for critical limb ischemia vascular surgery was consulted and patient was taken to emergently to Cath Lab for revascularization. Postprocedure patient had a successful stent of SFA  Patient was admitted under hospitalist service, GI was consulted for chronic anemia and iron deficiency.  Further management as below.   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 Heparin GTT continued. Started hydrocodone as needed for pain control Continue IV fentanyl as needed for breakthrough pain  Further recommendations per Dr. Delana Meyer for IV heparin and any need of DOAC ??    Iron deficiency anemia: Chart review shows that patient has been anemic from our records since 2018 with a hemoglobin of 10.6. Per patient she has been anemic since the age of 60. Anemia shows severe microcytosis and consistent with iron deficiency. Patient received multiple iron infusions in the past, recommend to follow with hematology as an outpatient.   Suspect chronic GI loss from NSAID use. GI consulted for severe anemia and suspected GI source.  Recommended outpatient follow-up for EGD and colonoscopy thyroid functions wnl, anemia  panel iron sat 5% and B12 190 low. Hb 6.5, s/p 1 unit of PRBC.  7.3 infusion, repeat Hb 8.2 Started Venofer 200 mg IV daily during hospital stay followed by oral iron supplement with vitamin C. Vitamin B12 level 190, target >400, started vitamin B12 1000 mcg IM injection during hospital stay followed by oral supplement. Follow with PCP to repeat iron profile and vitamin B12 level after 3 months Check stool sample for GI pathogen to rule out any parasitic infection   Abdominal pain/GERD/duodenal nodule Patient does not report any abdominal pain however on exam has generalized vague abdominal pain.  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 No significant narrowing of the superior mesenteric artery. No significant abnormality of the mesenteric veins. Mild colonic diverticulosis.  Unchanged mild intrahepatic biliary dilatation, predominately in the left hepatic lobe. Continue PPI 40 mg IV twice daily Resumed diet, we will follow for tolerance      Essential hypertension: Patient denies taking any blood pressure medication. With her history of vascular disease and ongoing tobacco abuse we will start patient on  Amlodipine 5 mg p.o. daily. continue hydralazine IV prn.  EKG NSR.   Hyponatremia sodium 131--132, we will continue to monitor most likely nutritional deficiency.  Hypokalemia, potassium repleted.  We will continue to monitor and replete as needed.  Mild acidosis, CO2 19, started oral bicarbonate supplement.  Hypoglycemia could be secondary to poor nutrition Started on hypoglycemia protocol Continue to monitor FSBG    Body mass index is 27.07 kg/m.  Interventions:       Diet: Regular diet DVT Prophylaxis: Therapeutic Anticoagulation  with heparin IV infusion    Advance goals of care discussion: DNR  Family Communication: family was NOT present at bedside, at the time of interview.  The pt provided permission to  discuss medical plan with the family. Opportunity was given to ask question and all questions were answered satisfactorily.   Disposition:  Pt is from home, admitted with right foot ischemia s/p SFA stent on IV heparin infusion, still has significant pain on IV fentanyl, which precludes a safe discharge. Discharge to home, when when clinically stable, may require few days more to improve.  Subjective: No significant overnight events, patient feels improvement in the right lower extremity pain, still has pain 7/10, no any other active issues.  Denied any abdominal pain, no nausea vomiting or diarrhea.   Physical Exam: General:  alert oriented to time, place, and person.  Appear in mild distress, affect appropriate Eyes: PERRLA ENT: Oral Mucosa Clear, moist  Neck: no JVD,  Cardiovascular: S1 and S2 Present, no Murmur,  Respiratory: good respiratory effort, Bilateral Air entry equal and Decreased, no Crackles, no wheezes Abdomen: Bowel Sound present, Soft and no tenderness,  Skin: no rashes Extremities: no Pedal edema, no calf tenderness, right foot cold, tender to touch, no erythema or any sign of infection Neurologic: without any new focal findings Gait not checked due to patient safety concerns  Vitals:   06/23/21 0052 06/23/21 0639 06/23/21 0836 06/23/21 1137  BP: (!) 153/68 (!) 145/60 (!) 155/70 (!) 162/68  Pulse: 73 82 68 66  Resp:  18 16 16   Temp:  98.4 F (36.9 C) 98.1 F (36.7 C) 98.4 F (36.9 C)  TempSrc:      SpO2:  95% 99% 99%  Weight:      Height:        Intake/Output Summary (Last 24 hours) at 06/23/2021 1457 Last data filed at 06/23/2021 1419 Gross per 24 hour  Intake 366.05 ml  Output 700 ml  Net -333.95 ml   Filed Weights   06/21/21 1455 06/21/21 1557  Weight: 66.7 kg 67.1 kg    Data Reviewed: I have personally reviewed and interpreted daily labs, tele strips, imagings as discussed above. I reviewed all nursing notes, pharmacy notes, vitals, pertinent  old records I have discussed plan of care as described above with RN and patient/family.  CBC: Recent Labs  Lab 06/21/21 1500 06/21/21 1616 06/22/21 0258 06/22/21 1152 06/22/21 2244 06/23/21 0630  WBC 6.0 5.6 5.6  --   --  5.3  NEUTROABS 4.1  --   --   --   --   --   HGB 6.7* 6.5* 7.3* 8.2* 7.5* 7.9*  HCT 25.0* 23.6* 25.8* 28.9* 26.2* 27.3*  MCV 69.6* 68.6* 71.1*  --   --  70.4*  PLT 472* 444* 357  --   --  619   Basic Metabolic Panel: Recent Labs  Lab 06/21/21 1500 06/22/21 0859 06/23/21 0630  NA 131*  --  132*  K 3.5  --  3.0*  CL 104  --  99  CO2 20*  --  19*  GLUCOSE 91  --  68*  BUN 13  --  9  CREATININE 0.69  --  0.52  CALCIUM 8.5*  --  8.9  MG  --  1.8 2.0  PHOS  --  3.2 3.6    Studies: No results found.  Scheduled Meds:  amLODipine  5 mg Oral Daily   cyanocobalamin  1,000 mcg Intramuscular Daily   Followed by   [  START ON 06/29/2021] vitamin B-12  500 mcg Oral Daily   famotidine  40 mg Oral QHS   pantoprazole  40 mg Intravenous Daily   sodium bicarbonate  650 mg Oral TID   sodium chloride flush  3 mL Intravenous Q12H   Continuous Infusions:  sodium chloride     heparin 1,150 Units/hr (06/22/21 1323)   iron sucrose 200 mg (06/23/21 0900)   potassium chloride 10 mEq (06/23/21 1427)   PRN Meds: sodium chloride, acetaminophen, diphenhydrAMINE, fentaNYL (SUBLIMAZE) injection, hydrALAZINE, HYDROmorphone, naLOXone (NARCAN)  injection, ondansetron (ZOFRAN) IV, sodium chloride flush  Time spent: 35 minutes  Author: Val Riles. MD [facility] 06/23/2021 2:57 PM  To reach On-call, see care teams to locate the attending and reach out to them via [email or web address]. If 7PM-7AM, please contact night-coverage If you still have difficulty reaching the attending provider, please page the [facility] (Director on Call) for [facility] on amion for assistance.

## 2021-06-23 NOTE — Consult Note (Signed)
Sydney Evans for IV Heparin Indication:  Limb ischemia / arterial occlusion  Patient Measurements: Height: 5\' 2"  (157.5 cm) Weight: 67.1 kg (148 lb) IBW/kg (Calculated) : 50.1 Heparin Dosing Weight: 64 kg  Labs: Recent Labs    06/21/21 1500 06/21/21 1616 06/22/21 0258 06/22/21 0859 06/22/21 1152 06/22/21 1651 06/22/21 2244 06/23/21 0630  HGB 6.7* 6.5* 7.3*  --  8.2*  --  7.5* 7.9*  HCT 25.0* 23.6* 25.8*  --  28.9*  --  26.2* 27.3*  PLT 472* 444* 357  --   --   --   --  341  APTT  --  28  --   --   --   --   --   --   LABPROT  --  13.2  --   --   --   --   --   --   INR  --  1.0  --   --   --   --   --   --   HEPARINUNFRC  --   --  0.37   < >  --  0.59 0.62 0.55  CREATININE 0.69  --   --   --   --   --   --   --    < > = values in this interval not displayed.     Estimated Creatinine Clearance: 60.5 mL/min (by C-G formula based on SCr of 0.69 mg/dL).   Medical History: Past Medical History:  Diagnosis Date   Anemia    Anxiety    Arthritis    Barrett's esophagus    Bipolar disorder ([place])    Blocked artery    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   Blood clot in vein    Cancer ([place])    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   Carotid arterial disease (HCC)    Contracture of joint of upper arm    Depression    Elevated lipids    GERD (gastroesophageal reflux disease)    Hypertension    Migraine    Osteoporosis    Poor balance    Sinus congestion    Stroke (HCC)    x 2   TIA (transient ischemic attack)    Vertigo     Medications:  No anticoagulation prior to admission per my chart review  Assessment: Patient is a [name] with medical history as above who presented to the ED 2/16 complaining of right foot pain with associated toe discoloration. RN un-able to locate pulses. Suspicion for arterial occlusion. Pharmacy consulted to initiate heparin infusion for arterial occlusion.   Baseline aPTT 28 and PT-INR 1.0. Baseline CBC notable for Hgb 6.7 which appears roughly  c/w patient's baseline which appears to be about 7.5  Date/HL HL Rate/comment 2/17 @ 0258 0.33 Therapeutic; 1,000 units/hr 2/17 @ 0859 0.26 Subtherapeutic; 1150 units/hr 2/17 @1651  0.59 Therapeutic.  2/17 @2244     0.62     Therapeutic X 2  2/18 @0630  0.55 Therapeutic.    Goal of Therapy:  Heparin level 0.3-0.7 units/ml Monitor platelets by anticoagulation protocol: Yes   Plan:  Heparin level is therapeutic. Will continue heparin infusion at 1150 units/hr. Recheck heparin level and CBC with AM labs.   Sydney Evans, PharmD 06/23/2021 7:36 AM

## 2021-06-23 NOTE — Progress Notes (Signed)
° ° °  Subjective  - POD #2, s/p right SFA stent and angioplasty of the anterior tibial and peroneal artery  Says her pain was better controlled overnight with her current regimen   Physical Exam:  Right foot is warm to the touch without mottling.  She has a biphasic posterior tibial Doppler signal behind the ankle and a monophasic dorsalis pedis.  She can dorsiflex her ankle but has no movement in her toes.  She has minimal sensation in the foot. Unable to gain motor or sensory function in the foot.  Assessment/Plan:  POD #2  Right leg ischemia: After intervention, the patient appears to have adequate blood flow to her foot, however given the 1 week duration of symptoms prior to her presentation, she has been unable to regain motor or sensory function.  At this point, I would encourage her to work with physical therapy to see if we can gain some function of the foot.  She is at risk for amputation, however the indication for this currently would be the inability to control her pain.  I spoke with her brother via telephone and detailed the above.  He understands and is satisfied with her current level of care.  Her disease is mostly now small vessel issues on the foot.  I will place a nitroglycerin patch on her foot to see if this gives her any relief. Wells Dionysios Massman 06/23/2021 5:15 PM --  Vitals:   06/23/21 1137 06/23/21 1525  BP: (!) 162/68 (!) 154/65  Pulse: 66 71  Resp: 16 16  Temp: 98.4 F (36.9 C) 98 F (36.7 C)  SpO2: 99% 100%    Intake/Output Summary (Last 24 hours) at 06/23/2021 1715 Last data filed at 06/23/2021 1545 Gross per 24 hour  Intake 719.68 ml  Output 700 ml  Net 19.68 ml     Laboratory CBC    Component Value Date/Time   WBC 5.3 06/23/2021 0630   HGB 7.5 (L) 06/23/2021 1639   HGB 9.1 (L) 03/17/2020 1638   HCT 26.0 (L) 06/23/2021 1639   HCT 30.8 (L) 03/17/2020 1638   PLT 341 06/23/2021 0630   PLT 396 03/17/2020 1638    BMET    Component Value  Date/Time   NA 132 (L) 06/23/2021 0630   NA 139 03/17/2020 1638   K 3.0 (L) 06/23/2021 0630   CL 99 06/23/2021 0630   CO2 19 (L) 06/23/2021 0630   GLUCOSE 68 (L) 06/23/2021 0630   BUN 9 06/23/2021 0630   BUN 9 03/17/2020 1638   CREATININE 0.52 06/23/2021 0630   CALCIUM 8.9 06/23/2021 0630   GFRNONAA >60 06/23/2021 0630   GFRAA 86 03/17/2020 1638    COAG Lab Results  Component Value Date   INR 1.0 06/21/2021   INR 1.2 10/03/2019   INR 0.92 11/15/2016   No results found for: PTT  Antibiotics Anti-infectives (From admission, onward)    Start     Dose/Rate Route Frequency Ordered Stop   06/22/21 0600  ceFAZolin (ANCEF) IVPB 2g/100 mL premix  Status:  Discontinued        2 g 200 mL/hr over 30 Minutes Intravenous On call to O.R. 06/21/21 1534 06/21/21 1901        V. Leia Alf, M.D., [facility] [place] Office: [phone] Pager:  [phone]

## 2021-06-24 LAB — CBC
HCT: 26 % — ABNORMAL LOW (ref 36.0–46.0)
Hemoglobin: 7.4 g/dL — ABNORMAL LOW (ref 12.0–15.0)
MCH: 20.1 pg — ABNORMAL LOW (ref 26.0–34.0)
MCHC: 28.5 g/dL — ABNORMAL LOW (ref 30.0–36.0)
MCV: 70.5 fL — ABNORMAL LOW (ref 80.0–100.0)
Platelets: 400 10*3/uL (ref 150–400)
RBC: 3.69 MIL/uL — ABNORMAL LOW (ref 3.87–5.11)
RDW: 23.2 % — ABNORMAL HIGH (ref 11.5–15.5)
WBC: 5.1 10*3/uL (ref 4.0–10.5)
nRBC: 0 % (ref 0.0–0.2)

## 2021-06-24 LAB — MAGNESIUM: Magnesium: 2 mg/dL (ref 1.7–2.4)

## 2021-06-24 LAB — BASIC METABOLIC PANEL
Anion gap: 9 (ref 5–15)
BUN: 8 mg/dL (ref 8–23)
CO2: 22 mmol/L (ref 22–32)
Calcium: 8.8 mg/dL — ABNORMAL LOW (ref 8.9–10.3)
Chloride: 101 mmol/L (ref 98–111)
Creatinine, Ser: 0.58 mg/dL (ref 0.44–1.00)
GFR, Estimated: >60 mL/min (ref >60)
Glucose, Bld: 98 mg/dL (ref 70–99)
Potassium: 3.5 mmol/L (ref 3.5–5.1)
Sodium: 132 mmol/L — ABNORMAL LOW (ref 135–145)

## 2021-06-24 LAB — HEPARIN LEVEL (UNFRACTIONATED)
Heparin Unfractionated: 0.25 IU/mL — ABNORMAL LOW (ref 0.30–0.70)
Heparin Unfractionated: 0.62 IU/mL (ref 0.30–0.70)
Heparin Unfractionated: 0.45 IU/mL (ref 0.30–0.70)

## 2021-06-24 LAB — PHOSPHORUS: Phosphorus: 3.7 mg/dL (ref 2.5–4.6)

## 2021-06-24 MED ORDER — HEPARIN BOLUS VIA INFUSION
950.0000 [IU] | Freq: Once | INTRAVENOUS | Status: AC
  Administered 2021-06-24: 950 [IU] via INTRAVENOUS
  Filled 2021-06-24: qty 950

## 2021-06-24 NOTE — Consult Note (Signed)
[place] for IV Heparin Indication:  Limb ischemia / arterial occlusion  Patient Measurements: Height: 5\' 2"  (157.5 cm) Weight: 67.1 kg (148 lb) IBW/kg (Calculated) : 50.1 Heparin Dosing Weight: 64 kg  Labs: Recent Labs    06/21/21 1616 06/22/21 0258 06/22/21 0859 06/22/21 2244 06/23/21 0630 06/23/21 1639 06/24/21 0537  HGB 6.5* 7.3*   < > 7.5* 7.9* 7.5* 7.4*  HCT 23.6* 25.8*   < > 26.2* 27.3* 26.0* 26.0*  PLT 444* 357  --   --  341  --  400  APTT 28  --   --   --   --   --   --   LABPROT 13.2  --   --   --   --   --   --   INR 1.0  --   --   --   --   --   --   HEPARINUNFRC  --  0.37   < > 0.62 0.55  --  0.25*  CREATININE  --   --   --   --  0.52 0.61 0.58   < > = values in this interval not displayed.     Estimated Creatinine Clearance: 60.5 mL/min (by C-G formula based on SCr of 0.58 mg/dL).   Medical History: Past Medical History:  Diagnosis Date   Anemia    Anxiety    Arthritis    Barrett's esophagus    Bipolar disorder ([place])    Blocked artery    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   Blood clot in vein    Cancer ([place])    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   Carotid arterial disease (HCC)    Contracture of joint of upper arm    Depression    Elevated lipids    GERD (gastroesophageal reflux disease)    Hypertension    Migraine    Osteoporosis    Poor balance    Sinus congestion    Stroke (HCC)    x 2   TIA (transient ischemic attack)    Vertigo     Medications:  No anticoagulation prior to admission per my chart review  Assessment: Patient is a [name] with medical history as above who presented to the ED 2/16 complaining of right foot pain with associated toe discoloration. RN un-able to locate pulses. Suspicion for arterial occlusion. Pharmacy consulted to initiate heparin infusion for arterial occlusion.   Baseline aPTT 28 and PT-INR 1.0. Baseline CBC notable for Hgb 6.7 which appears roughly c/w patient's baseline which appears to be about  7.5  Date/HL HL Rate/comment 2/17 @ 0258 0.33 Therapeutic; 1,000 units/hr 2/17 @ 0859 0.26 Subtherapeutic; 1150 units/hr 2/17 @1651  0.59 Therapeutic.  2/17 @2244     0.62     Therapeutic X 2  2/18 @0630  0.55 Therapeutic.  2/19 @0537     0.25     SUBtherapeutic   Goal of Therapy:  Heparin level 0.3-0.7 units/ml Monitor platelets by anticoagulation protocol: Yes   Plan:  2/19:  HL @ 0537 = 0.25, SUBtherapeutic Spoke with RN who confirmed heparin gtt is running and there have been no interruptions in infusion. Will order heparin 950 units IV X 1 bolus and increase drip rate to 1250 units/hr.  Will recheck HL 6 hrs after rate change.   Donnivan Villena D, PharmD 06/24/2021 7:08 AM

## 2021-06-24 NOTE — Evaluation (Signed)
Physical Therapy Evaluation Patient Details Name: Sydney Evans MRN: [id number] DOB: [birth year] Today's Date: 06/24/2021  History of Present Illness  Pt is a 71 y.o. female presenting to hospital 2/16 with c/o severe R foot pain with discoloration.  Pt admitted with atherosclerotic occlusive disease B LE's with ischemia of R LE, chronic anemia, and htn.  S/p R LE angiogram/revascularization (R SFA stent and angioplasty of the anteiror tibial and peroneal artery) 06/21/21.  PMH includes h/o multiple strokes, h/o DVT, anemia, anxiety, bipolar disorder, CAD, contraction of joint of upper arm, htn, poor balance, vertigo, back surgery, B CTR, foot sx, B TKA, B TSA.  Clinical Impression  Prior to hospital admission, pt was modified independent ambulating with rollator; h/o falls; lives alone in 1 level home (level entry); has PRN assist from family and neighbors.  Pt's R foot pain 8/10 upon PT arrival but improved to 6/10 with ambulation and end of session at rest.  Currently pt is CGA progressing to SBA with transfers using RW; and CGA ambulating 25 feet with RW (antalgic step to gait noted with decreased cadence d/t R foot pain).  Pt very motivated to participate in therapy and improve her functional mobility.  Pt would benefit from skilled PT to address noted impairments and functional limitations (see below for any additional details).  Upon hospital discharge, pt would benefit from [place]; pt reports she has someone that can stay with her 24/7 for first 2-3 days to assist as needed.    Recommendations for follow up therapy are one component of a multi-disciplinary discharge planning process, led by the attending physician.  Recommendations may be updated based on patient status, additional functional criteria and insurance authorization.  Follow Up Recommendations Home health PT    Assistance Recommended at Discharge Intermittent Supervision/Assistance  Patient can return home with the following  A  little help with walking and/or transfers;A little help with bathing/dressing/bathroom;Assistance with cooking/housework;Assist for transportation;Help with stairs or ramp for entrance    Equipment Recommendations Rolling walker (2 wheels) (youth sized)  Recommendations for Other Services  OT consult    Functional Status Assessment Patient has had a recent decline in their functional status and demonstrates the ability to make significant improvements in function in a reasonable and predictable amount of time.     Precautions / Restrictions Precautions Precautions: Fall Restrictions Weight Bearing Restrictions: No      Mobility  Bed Mobility               General bed mobility comments: Deferred (pt in recliner beginning/end of session)    Transfers Overall transfer level: Needs assistance Equipment used: Rolling walker (2 wheels) Transfers: Sit to/from Stand Sit to Stand: Min guard, Supervision           General transfer comment: x2 trials standing from recliner; fairly strong stand noted with RW use; vc's for UE placement required    Ambulation/Gait Ambulation/Gait assistance: Min guard Gait Distance (Feet): 25 Feet Assistive device: Rolling walker (2 wheels)   Gait velocity: decreased     General Gait Details: decreased stance time R LE; vc's to increase UE suppport through RW to offweight R LE d/t R foot pain; step to gait pattern  Stairs            Wheelchair Mobility    Modified Rankin (Stroke Patients Only)       Balance Overall balance assessment: Needs assistance Sitting-balance support: No upper extremity supported, Feet supported Sitting balance-Leahy Scale: Good Sitting balance - Comments:  steady sitting reaching within BOS   Standing balance support: Bilateral upper extremity supported, During functional activity Standing balance-Leahy Scale: Good Standing balance comment: no loss of balance noted ambulating with RW in room                              Pertinent Vitals/Pain Pain Assessment Pain Assessment: 0-10 Pain Score: 6  Pain Location: R foot Pain Descriptors / Indicators: Aching Pain Intervention(s): Limited activity within patient's tolerance, Monitored during session, Premedicated before session, Repositioned Vitals (HR and O2 on room air) stable and WFL throughout treatment session.    Home Living Family/patient expects to be discharged to:: Private residence Living Arrangements: Alone Available Help at Discharge: Family;Neighbor;Available PRN/intermittently Type of Home: House Home Access: Level entry       Home Layout: One level Home Equipment: Rollator (4 wheels);Shower seat;Grab bars - tub/shower Additional Comments: Lives with dog "Ruby" who is being trained as a Theme park manager    Prior Function Prior Level of Function : History of Falls (last six months);Independent/Modified Independent             Mobility Comments: Ambulatory with 4ww; h/o falls       Hand Dominance        Extremity/Trunk Assessment   Upper Extremity Assessment Upper Extremity Assessment: Generalized weakness    Lower Extremity Assessment Lower Extremity Assessment: RLE deficits/detail (at least 3/5 L LE AROM hip flexion, knee flexion/extension, and DF/PF) RLE Deficits / Details: hip flexion and knee flexion/extension at least 3/5 AROM; DF at least 2/5 (R ankle/foot weakness noted but also complicated d/t pain) RLE: Unable to fully assess due to pain    Cervical / Trunk Assessment Cervical / Trunk Assessment: Normal  Communication   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  Pt agreeable to PT  session.  Pt's niece present during session.    Exercises  Gait training with RW.   Assessment/Plan    PT Assessment Patient needs continued PT services  PT Problem List Decreased strength;Decreased activity tolerance;Decreased balance;Decreased mobility;Decreased knowledge of use of DME;Decreased knowledge of precautions;Pain       PT Treatment Interventions DME instruction;Gait training;Functional mobility training;Therapeutic activities;Therapeutic exercise;Balance training;Patient/family education    PT Goals (Current goals can be found in the Care Plan section)  Acute Rehab PT Goals Patient Stated Goal: to go home PT Goal Formulation: With patient Time For Goal Achievement: 07/08/21 Potential to Achieve Goals: Good    Frequency Min 2X/week     Co-evaluation               AM-PAC PT "6 Clicks" Mobility  Outcome Measure Help needed turning from your back to your side while in a flat bed without using bedrails?: None Help needed moving from lying on your back to sitting on the side of a flat bed without using bedrails?: None Help needed moving to and from a bed to a chair (including a wheelchair)?: A Little Help needed standing up from a chair using your arms (e.g., wheelchair or  bedside chair)?: A Little Help needed to walk in hospital room?: A Little Help needed climbing 3-5 steps with a railing? : A Little 6 Click Score: 20    End of Session Equipment Utilized During Treatment: Gait belt Activity Tolerance: Patient tolerated treatment well Patient left: in chair;with call bell/phone within reach;with chair alarm set;with family/visitor present Nurse Communication: Mobility status;Precautions PT Visit Diagnosis: Other abnormalities of gait and mobility (R26.89);Muscle weakness (generalized) (M62.81);History of falling (Z91.81);Pain Pain - Right/Left: Right Pain - part of body: Ankle and joints of foot    Time: 1621-1650 PT Time Calculation (min) (ACUTE ONLY):  29 min   Charges:   PT Evaluation $PT Eval Low Complexity: 1 Low PT Treatments $Gait Training: 8-22 mins       Leitha Bleak, PT 06/24/21, 5:13 PM

## 2021-06-24 NOTE — Progress Notes (Signed)
° ° °  Subjective  - POD #3  Says her foot is a little better today   Physical Exam:   Right foot remains warm Has better movement of her toes this am and sensation is slightly improved  Calf remains soft, no compartment issues   Assessment/Plan:  POD #3  Some improvement overnight, unclear if this is related to NTG patch, I would continue it for now  Formal anticoagulation recs tomorrow, continue heparin drip for now  Wells Bueford Arp 06/24/2021 3:16 PM --  Vitals:   06/24/21 0734 06/24/21 1151  BP: 123/87 (!) 151/90  Pulse: 82 69  Resp: 16 16  Temp: 98.2 F (36.8 C) 98.3 F (36.8 C)  SpO2: 98% 97%    Intake/Output Summary (Last 24 hours) at 06/24/2021 1516 Last data filed at 06/24/2021 1422 Gross per 24 hour  Intake 956.45 ml  Output 3050 ml  Net -2093.55 ml     Laboratory CBC    Component Value Date/Time   WBC 5.1 06/24/2021 0537   HGB 7.4 (L) 06/24/2021 0537   HGB 9.1 (L) 03/17/2020 1638   HCT 26.0 (L) 06/24/2021 0537   HCT 30.8 (L) 03/17/2020 1638   PLT 400 06/24/2021 0537   PLT 396 03/17/2020 1638    BMET    Component Value Date/Time   NA 132 (L) 06/24/2021 0537   NA 139 03/17/2020 1638   K 3.5 06/24/2021 0537   CL 101 06/24/2021 0537   CO2 22 06/24/2021 0537   GLUCOSE 98 06/24/2021 0537   BUN 8 06/24/2021 0537   BUN 9 03/17/2020 1638   CREATININE 0.58 06/24/2021 0537   CALCIUM 8.8 (L) 06/24/2021 0537   GFRNONAA >60 06/24/2021 0537   GFRAA 86 03/17/2020 1638    COAG Lab Results  Component Value Date   INR 1.0 06/21/2021   INR 1.2 10/03/2019   INR 0.92 11/15/2016   No results found for: PTT  Antibiotics Anti-infectives (From admission, onward)    Start     Dose/Rate Route Frequency Ordered Stop   06/22/21 0600  ceFAZolin (ANCEF) IVPB 2g/100 mL premix  Status:  Discontinued        2 g 200 mL/hr over 30 Minutes Intravenous On call to O.R. 06/21/21 1534 06/21/21 1901        V. Leia Alf, M.D., [facility] [place] Office: [phone] Pager:  [phone]

## 2021-06-24 NOTE — Consult Note (Signed)
S[place]ney [facility] for IV Heparin Indication:  Limb ischemia / arterial occlusion  Patient Measurements: Height: 5\' 2"  (157.5 cm) Weight: 67.1 kg (148 lb) IBW/kg (Calculated) : 50.1 Heparin Dosing Weight: 64 kg  Labs: Recent Labs    06/22/21 0258 06/22/21 0859 06/23/21 0630 06/23/21 1639 06/24/21 0537 06/24/21 1252 06/24/21 1848  HGB 7.3*   < > 7.9* 7.5* 7.4*  --   --   HCT 25.8*   < > 27.3* 26.0* 26.0*  --   --   PLT 357  --  341  --  400  --   --   HEPARINUNFRC 0.37   < > 0.55  --  0.25* 0.62 0.45  CREATININE  --   --  0.52 0.61 0.58  --   --    < > = values in this interval not displayed.     Estimated Creatinine Clearance: 60.5 mL/min (by C-G formula based on SCr of 0.58 mg/dL).   Medical History: Past Medical History:  Diagnosis Date   Anemia    Anxiety    Arthritis    Barrett's esophagus    Bipolar disorder ([place])    Blocked artery    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   Blood clot in vein    Cancer ([place])    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   Carotid arterial disease (HCC)    Contracture of joint of upper arm    Depression    Elevated lipids    GERD (gastroesophageal reflux disease)    Hypertension    Migraine    Osteoporosis    Poor balance    Sinus congestion    Stroke (HCC)    x 2   TIA (transient ischemic attack)    Vertigo     Medications:  No anticoagulation prior to admission per my chart review  Assessment: Patient is a [name] with medical history as above who presented to the ED 2/16 complaining of right foot pain with associated toe discoloration. RN un-able to locate pulses. Suspicion for arterial occlusion. Pharmacy consulted to initiate heparin infusion for arterial occlusion.   Baseline aPTT 28 and PT-INR 1.0. Baseline CBC notable for Hgb 6.7 which appears roughly c/w patient's baseline which appears to be about 7.5  Date/HL HL Rate/comment 2/17 @ 0258 0.33 Therapeutic 2/17 @ 0859 0.26 Subtherapeutic 2/17 @1651  0.59 Therapeutic.  2/17  @2244     0.62     Therapeutic x 2  2/18 @0630  0.55 Therapeutic.  2/19 @0537     0.25     Subtherapeutic 2/19 @1252  0.62 Therapeutic 2/19 @1848  0.45 Therapeutic x 2   Goal of Therapy:  Heparin level 0.3-0.7 units/ml Monitor platelets by anticoagulation protocol: Yes   Plan:  Heparin level is therapeutic. Will continue heparin infusion at 1250 units/hr. Recheck heparin level and CBC with AM labs.   [name], PharmD 06/24/2021 7:18 PM

## 2021-06-24 NOTE — Consult Note (Signed)
[place] for IV Heparin Indication:  Limb ischemia / arterial occlusion  Patient Measurements: Height: 5\' 2"  (157.5 cm) Weight: 67.1 kg (148 lb) IBW/kg (Calculated) : 50.1 Heparin Dosing Weight: 64 kg  Labs: Recent Labs    06/21/21 1616 06/22/21 0258 06/22/21 0859 06/23/21 0630 06/23/21 1639 06/24/21 0537 06/24/21 1252  HGB 6.5* 7.3*   < > 7.9* 7.5* 7.4*  --   HCT 23.6* 25.8*   < > 27.3* 26.0* 26.0*  --   PLT 444* 357  --  341  --  400  --   APTT 28  --   --   --   --   --   --   LABPROT 13.2  --   --   --   --   --   --   INR 1.0  --   --   --   --   --   --   HEPARINUNFRC  --  0.37   < > 0.55  --  0.25* 0.62  CREATININE  --   --   --  0.52 0.61 0.58  --    < > = values in this interval not displayed.     Estimated Creatinine Clearance: 60.5 mL/min (by C-G formula based on SCr of 0.58 mg/dL).   Medical History: Past Medical History:  Diagnosis Date   Anemia    Anxiety    Arthritis    Barrett's esophagus    Bipolar disorder ([place])    Blocked artery    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   Blood clot in vein    Cancer ([place])    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   Carotid arterial disease (HCC)    Contracture of joint of upper arm    Depression    Elevated lipids    GERD (gastroesophageal reflux disease)    Hypertension    Migraine    Osteoporosis    Poor balance    Sinus congestion    Stroke (HCC)    x 2   TIA (transient ischemic attack)    Vertigo     Medications:  No anticoagulation prior to admission per my chart review  Assessment: Patient is a [name] with medical history as above who presented to the ED 2/16 complaining of right foot pain with associated toe discoloration. RN un-able to locate pulses. Suspicion for arterial occlusion. Pharmacy consulted to initiate heparin infusion for arterial occlusion.   Baseline aPTT 28 and PT-INR 1.0. Baseline CBC notable for Hgb 6.7 which appears roughly c/w patient's baseline which appears to be about  7.5  Date/HL HL Rate/comment 2/17 @ 0258 0.33 Therapeutic; 1,000 units/hr 2/17 @ 0859 0.26 Subtherapeutic; 1150 units/hr 2/17 @1651  0.59 Therapeutic.  2/17 @2244     0.62     Therapeutic X 2  2/18 @0630  0.55 Therapeutic.  2/19 @0537     0.25     SUBtherapeutic 2/19 @1252  0.62 Therapeutic.    Goal of Therapy:  Heparin level 0.3-0.7 units/ml Monitor platelets by anticoagulation protocol: Yes   Plan:  Heparin level is therapeutic. Will continue heparin infusion at 1250 units/hr. Recheck heparin level in 6 hours. and CBC with AM labs.   Oswald Hillock, PharmD 06/24/2021 1:27 PM

## 2021-06-24 NOTE — Progress Notes (Signed)
[facility] Progress Note  Patient: Sydney Evans    YJE:[id number]  DOA: 06/21/2021     Date of Service: the patient was seen and examined on 06/24/2021  Chief Complaint  Patient presents with   Foot Pain   Brief hospital course: Daijah Scrivens is a 71 y.o. female with medical history significant of anemia, arthritis, DVT, Barrett's, hypertension, PAD, TIA, stroke presented to the emergency room today with complaints of severe right foot pain that had started 2 days prior port patient noticed discoloration with her foot becoming more blue with decreased sensation.  And over-the-counter regimens have not been helping. patient has no color Doppler flow to DP or PT and continues to have significant pain especially with palpation, with concern for critical limb ischemia vascular surgery was consulted and patient was taken to emergently to Cath Lab for revascularization. Postprocedure patient had a successful stent of SFA  Patient was admitted under hospitalist service, GI was consulted for chronic anemia and iron deficiency.  Further management as below.   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 Heparin GTT continued. Started hydrocodone as needed for pain control Continue IV fentanyl as needed for breakthrough pain  Further recommendations per Dr. Delana Meyer for IV heparin and any need of DOAC ?? 2/19 nitro patch on the foot ordered by vascular surgery for pain control   Iron deficiency anemia: Chart review shows that patient has been anemic from our records since 2018 with a hemoglobin of 10.6. Per patient she has been anemic since the age of 43. Anemia shows severe microcytosis and consistent with iron deficiency. Patient received multiple iron infusions in the past, recommend to follow with hematology as an outpatient.   Suspect chronic GI loss from NSAID use. GI consulted for severe anemia and suspected GI source.  Recommended  outpatient follow-up for EGD and colonoscopy thyroid functions wnl, anemia panel iron sat 5% and B12 190 low. Hb 6.5, s/p 1 unit of PRBC.  7.3 infusion, repeat Hb 8.2 Started Venofer 200 mg IV daily during hospital stay followed by oral iron supplement with vitamin C. Vitamin B12 level 190, target >400, started vitamin B12 1000 mcg IM injection during hospital stay followed by oral supplement. Follow with PCP to repeat iron profile and vitamin B12 level after 3 months Check stool sample for GI pathogen to rule out any parasitic infection   Abdominal pain/GERD/duodenal nodule Patient does not report any abdominal pain however on exam has generalized vague abdominal pain.  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 No significant narrowing of the superior mesenteric artery. No significant abnormality of the mesenteric veins. Mild colonic diverticulosis.  Unchanged mild intrahepatic biliary dilatation, predominately in the left hepatic lobe. Continue PPI 40 mg IV twice daily Resumed diet, we will follow for tolerance      Essential hypertension: Patient denies taking any blood pressure medication. With her history of vascular disease and ongoing tobacco abuse we will start patient on  Amlodipine 5 mg p.o. daily. continue hydralazine IV prn.  EKG NSR.   Hyponatremia sodium 131--132, we will continue to monitor most likely nutritional deficiency.  Hypokalemia, potassium repleted.  We will continue to monitor and replete as needed.  Mild acidosis, CO2 19, started oral bicarbonate supplement.  Hypoglycemia could be secondary to poor nutrition Started on hypoglycemia protocol Continue to monitor FSBG    Body mass index is 27.07 kg/m.  Interventions:  Diet: Regular diet DVT Prophylaxis: Therapeutic Anticoagulation with heparin IV infusion    Advance goals of care discussion: DNR  Family Communication: family was NOT  present at bedside, at the time of interview.  The pt provided permission to discuss medical plan with the family. Opportunity was given to ask question and all questions were answered satisfactorily.   Disposition:  Pt is from home, admitted with right foot ischemia s/p SFA stent on IV heparin infusion, still has significant pain on IV fentanyl, which precludes a safe discharge. Discharge to home, when when clinically stable, may require few days more to improve.  Subjective: No significant overnight events, patient feels improvement in the right lower extremity pain, still has pain 9/10, no any other active issues.  Denied any abdominal pain, no nausea vomiting or diarrhea.   Physical Exam: General:  alert oriented to time, place, and person.  Appear in mild distress, affect appropriate Eyes: PERRLA ENT: Oral Mucosa Clear, moist  Neck: no JVD,  Cardiovascular: S1 and S2 Present, no Murmur,  Respiratory: good respiratory effort, Bilateral Air entry equal and Decreased, no Crackles, no wheezes Abdomen: Bowel Sound present, Soft and no tenderness,  Skin: no rashes Extremities: no Pedal edema, no calf tenderness, right foot cold, tender to touch, no erythema or any sign of infection Neurologic: without any new focal findings Gait not checked due to patient safety concerns  Vitals:   06/23/21 1957 06/24/21 0532 06/24/21 0734 06/24/21 1151  BP: (!) 173/65 (!) 148/66 123/87 (!) 151/90  Pulse: 66 76 82 69  Resp: 16 16 16 16   Temp: 98 F (36.7 C) 97.9 F (36.6 C) 98.2 F (36.8 C) 98.3 F (36.8 C)  TempSrc:      SpO2: 96% 97% 98% 97%  Weight:      Height:        Intake/Output Summary (Last 24 hours) at 06/24/2021 1357 Last data filed at 06/24/2021 1032 Gross per 24 hour  Intake 956.45 ml  Output 2250 ml  Net -1293.55 ml   Filed Weights   06/21/21 1455 06/21/21 1557  Weight: 66.7 kg 67.1 kg    Data Reviewed: I have personally reviewed and interpreted daily labs, tele strips,  imagings as discussed above. I reviewed all nursing notes, pharmacy notes, vitals, pertinent old records I have discussed plan of care as described above with RN and patient/family.  CBC: Recent Labs  Lab 06/21/21 1500 06/21/21 1616 06/22/21 0258 06/22/21 1152 06/22/21 2244 06/23/21 0630 06/23/21 1639 06/24/21 0537  WBC 6.0 5.6 5.6  --   --  5.3  --  5.1  NEUTROABS 4.1  --   --   --   --   --   --   --   HGB 6.7* 6.5* 7.3* 8.2* 7.5* 7.9* 7.5* 7.4*  HCT 25.0* 23.6* 25.8* 28.9* 26.2* 27.3* 26.0* 26.0*  MCV 69.6* 68.6* 71.1*  --   --  70.4*  --  70.5*  PLT 472* 444* 357  --   --  341  --  458   Basic Metabolic Panel: Recent Labs  Lab 06/21/21 1500 06/22/21 0859 06/23/21 0630 06/23/21 1639 06/24/21 0537  NA 131*  --  132* 126* 132*  K 3.5  --  3.0* 3.7 3.5  CL 104  --  99 97* 101  CO2 20*  --  19* 23 22  GLUCOSE 91  --  68* 112* 98  BUN 13  --  9 9 8   CREATININE 0.69  --  0.52 0.61 0.58  CALCIUM 8.5*  --  8.9 8.7* 8.8*  MG  --  1.8 2.0  --  2.0  PHOS  --  3.2 3.6  --  3.7    Studies: No results found.  Scheduled Meds:  amLODipine  5 mg Oral Daily   cyanocobalamin  1,000 mcg Intramuscular Daily   Followed by   Derrill Memo ON 06/29/2021] vitamin B-12  500 mcg Oral Daily   famotidine  40 mg Oral QHS   nitroGLYCERIN  0.2 mg Transdermal Daily   pantoprazole  40 mg Intravenous Daily   polyethylene glycol  17 g Oral Daily   sodium chloride flush  3 mL Intravenous Q12H   Continuous Infusions:  sodium chloride     heparin 1,250 Units/hr (06/24/21 0934)   iron sucrose 440 mL/hr at 06/24/21 0934   PRN Meds: sodium chloride, acetaminophen, bisacodyl, diphenhydrAMINE, fentaNYL (SUBLIMAZE) injection, hydrALAZINE, HYDROmorphone, naLOXone (NARCAN)  injection, ondansetron (ZOFRAN) IV, sodium chloride flush  Time spent: 35 minutes  Author: Val Riles. MD [facility] 06/24/2021 1:57 PM  To reach On-call, see care teams to locate the attending and reach out to them via  [email or web address]. If 7PM-7AM, please contact night-coverage If you still have difficulty reaching the attending provider, please page the [facility] (Director on Call) for [facility] on amion for assistance.

## 2021-06-25 DIAGNOSIS — I70221 Atherosclerosis of native arteries of extremities with rest pain, right leg: Principal | ICD-10-CM

## 2021-06-25 LAB — HEMOGLOBIN AND HEMATOCRIT, BLOOD
HCT: 26.8 % — ABNORMAL LOW (ref 36.0–46.0)
Hemoglobin: 7.6 g/dL — ABNORMAL LOW (ref 12.0–15.0)

## 2021-06-25 LAB — CBC
HCT: 24.4 % — ABNORMAL LOW (ref 36.0–46.0)
Hemoglobin: 7.2 g/dL — ABNORMAL LOW (ref 12.0–15.0)
MCH: 21 pg — ABNORMAL LOW (ref 26.0–34.0)
MCHC: 29.5 g/dL — ABNORMAL LOW (ref 30.0–36.0)
MCV: 71.1 fL — ABNORMAL LOW (ref 80.0–100.0)
Platelets: 354 10*3/uL (ref 150–400)
RBC: 3.43 MIL/uL — ABNORMAL LOW (ref 3.87–5.11)
RDW: 23.9 % — ABNORMAL HIGH (ref 11.5–15.5)
WBC: 6.4 10*3/uL (ref 4.0–10.5)
nRBC: 0 % (ref 0.0–0.2)

## 2021-06-25 LAB — BASIC METABOLIC PANEL
Anion gap: 8 (ref 5–15)
BUN: 7 mg/dL — ABNORMAL LOW (ref 8–23)
CO2: 22 mmol/L (ref 22–32)
Calcium: 8.6 mg/dL — ABNORMAL LOW (ref 8.9–10.3)
Chloride: 100 mmol/L (ref 98–111)
Creatinine, Ser: 0.69 mg/dL (ref 0.44–1.00)
GFR, Estimated: >60 mL/min (ref >60)
Glucose, Bld: 99 mg/dL (ref 70–99)
Potassium: 3.3 mmol/L — ABNORMAL LOW (ref 3.5–5.1)
Sodium: 130 mmol/L — ABNORMAL LOW (ref 135–145)

## 2021-06-25 LAB — MAGNESIUM: Magnesium: 1.8 mg/dL (ref 1.7–2.4)

## 2021-06-25 LAB — OSMOLALITY: Osmolality: 267 mOsm/kg — ABNORMAL LOW (ref 275–295)

## 2021-06-25 LAB — HEPARIN LEVEL (UNFRACTIONATED)
Heparin Unfractionated: 0.27 IU/mL — ABNORMAL LOW (ref 0.30–0.70)
Heparin Unfractionated: <0.1 IU/mL — ABNORMAL LOW (ref 0.30–0.70)
Heparin Unfractionated: 0.37 IU/mL (ref 0.30–0.70)

## 2021-06-25 LAB — PHOSPHORUS: Phosphorus: 3.8 mg/dL (ref 2.5–4.6)

## 2021-06-25 LAB — PREPARE RBC (CROSSMATCH)

## 2021-06-25 MED ORDER — SODIUM CHLORIDE 1 G PO TABS
1.0000 g | ORAL_TABLET | Freq: Three times a day (TID) | ORAL | Status: DC
  Administered 2021-06-25 (×2): 1 g via ORAL
  Filled 2021-06-25 (×3): qty 1

## 2021-06-25 MED ORDER — SODIUM CHLORIDE 0.9% IV SOLUTION: Freq: Once | INTRAVENOUS | Status: AC

## 2021-06-25 MED ORDER — AMLODIPINE BESYLATE 10 MG PO TABS
10.0000 mg | ORAL_TABLET | Freq: Every day | ORAL | Status: DC
  Administered 2021-06-26: 10 mg via ORAL
  Filled 2021-06-25: qty 1

## 2021-06-25 MED ORDER — POTASSIUM CHLORIDE CRYS ER 20 MEQ PO TBCR
40.0000 meq | EXTENDED_RELEASE_TABLET | Freq: Once | ORAL | Status: AC
  Administered 2021-06-25: 40 meq via ORAL
  Filled 2021-06-25: qty 2

## 2021-06-25 MED ORDER — HEPARIN BOLUS VIA INFUSION
1900.0000 [IU] | Freq: Once | INTRAVENOUS | Status: AC
  Administered 2021-06-25: 1900 [IU] via INTRAVENOUS
  Filled 2021-06-25: qty 1900

## 2021-06-25 MED ORDER — AMLODIPINE BESYLATE 5 MG PO TABS
5.0000 mg | ORAL_TABLET | Freq: Once | ORAL | Status: AC
  Administered 2021-06-25: 5 mg via ORAL
  Filled 2021-06-25: qty 1

## 2021-06-25 MED ORDER — HEPARIN BOLUS VIA INFUSION
950.0000 [IU] | Freq: Once | INTRAVENOUS | Status: DC
  Filled 2021-06-25: qty 950

## 2021-06-25 NOTE — Progress Notes (Signed)
[facility]  Daily Progress Note   Subjective  - 4 Days Post-Op  I walked around the nursing station twice today.  Foot still feels like pins-and-needles across the toes but better than when I came in  Objective Vitals:   06/25/21 0739 06/25/21 1619 06/25/21 1748 06/25/21 1804  BP: (!) 168/68 (!) 149/69 (!) 149/55 (!) 146/77  Pulse: 64 66 65 65  Resp: 16 18 18 18   Temp: 98.1 F (36.7 C) 97.9 F (36.6 C) 98.3 F (36.8 C) 98.4 F (36.9 C)  TempSrc: Oral  Oral Oral  SpO2: 98% 100% 100% 100%  Weight:      Height:        Intake/Output Summary (Last 24 hours) at 06/25/2021 1807 Last data filed at 06/25/2021 1753 Gross per 24 hour  Intake 1446.01 ml  Output --  Net 1446.01 ml    PULM  Normal effort , no use of accessory muscles CV  No JVD, RRR Abd      No distended, nontender VASC  pedal pulses are nonpalpable  Laboratory CBC    Component Value Date/Time   WBC 6.4 06/25/2021 0521   HGB 7.6 (L) 06/25/2021 1426   HGB 9.1 (L) 03/17/2020 1638   HCT 26.8 (L) 06/25/2021 1426   HCT 30.8 (L) 03/17/2020 1638   PLT 354 06/25/2021 0521   PLT 396 03/17/2020 1638    BMET    Component Value Date/Time   NA 130 (L) 06/25/2021 0521   NA 139 03/17/2020 1638   K 3.3 (L) 06/25/2021 0521   CL 100 06/25/2021 0521   CO2 22 06/25/2021 0521   GLUCOSE 99 06/25/2021 0521   BUN 7 (L) 06/25/2021 0521   BUN 9 03/17/2020 1638   CREATININE 0.69 06/25/2021 0521   CALCIUM 8.6 (L) 06/25/2021 0521   GFRNONAA >60 06/25/2021 0521   GFRAA 86 03/17/2020 1638    Assessment/Planning: POD #4 s/p multilevel intervention for ischemic right foot  Given her ability to walk in conjunction with what sounds like a reasonable discomfort I would like to give her more time before committing to any further interventions/surgeries or committing to an amputation.  With this in mind I would stop the heparin drip tomorrow morning after transitioning to Plavix and aspirin.   Hortencia Pilar  06/25/2021, 6:07 PM

## 2021-06-25 NOTE — Care Management Important Message (Signed)
Important Message  Patient Details  Name: Sydney Evans MRN: [id number] Date of Birth: [birth year]   Medicare Important Message Given:  Yes     Dannette Barbara 06/25/2021, 11:12 AM

## 2021-06-25 NOTE — Consult Note (Addendum)
[place] for IV Heparin Indication:  Limb ischemia / arterial occlusion  Patient Measurements: Height: 5\' 2"  (157.5 cm) Weight: 67.1 kg (148 lb) IBW/kg (Calculated) : 50.1 Heparin Dosing Weight: 64 kg  Labs: Recent Labs    06/23/21 0630 06/23/21 1639 06/24/21 0537 06/24/21 1252 06/24/21 1848 06/25/21 0521  HGB 7.9* 7.5* 7.4*  --   --  7.2*  HCT 27.3* 26.0* 26.0*  --   --  24.4*  PLT 341  --  400  --   --  354  HEPARINUNFRC 0.55  --  0.25* 0.62 0.45 0.27*  CREATININE 0.52 0.61 0.58  --   --  0.69     Estimated Creatinine Clearance: 60.5 mL/min (by C-G formula based on SCr of 0.69 mg/dL).   Medical History: Past Medical History:  Diagnosis Date   Anemia    Anxiety    Arthritis    Barrett's esophagus    Bipolar disorder ([place])    Blocked artery    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   Blood clot in vein    Cancer ([place])    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   Carotid arterial disease (HCC)    Contracture of joint of upper arm    Depression    Elevated lipids    GERD (gastroesophageal reflux disease)    Hypertension    Migraine    Osteoporosis    Poor balance    Sinus congestion    Stroke ([facility])    x 2   TIA (transient ischemic attack)    Vertigo     Medications:  No anticoagulation prior to admission per my chart review  Assessment: Patient is a [name] with medical history as above who presented to the ED 2/16 complaining of right foot pain with associated toe discoloration. RN un-able to locate pulses. Suspicion for arterial occlusion. Pharmacy consulted to initiate heparin infusion for arterial occlusion.   Baseline aPTT 28 and PT-INR 1.0. Baseline CBC notable for Hgb 6.7 which appears roughly c/w patient's baseline which appears to be about 7.5  Date/HL HL Rate/comment 2/17 @ 0258 0.33 Therapeutic 2/17 @ 0859 0.26 Subtherapeutic 2/17 @1651  0.59 Therapeutic.  2/17 @2244     0.62     Therapeutic x 2  2/18 @0630  0.55 Therapeutic.  2/19 @0537     0.25      Subtherapeutic 2/19 @1252  0.62 Therapeutic 2/19 @1848  0.45 Therapeutic x 2 2/20 @0521     0.27     SUBtherapeutic    Goal of Therapy:  Heparin level 0.3-0.7 units/ml Monitor platelets by anticoagulation protocol: Yes   Plan:  2/20:  HL @ 0521 = 0.27, SUBtherapeutic  - RN stated IV access was lost around 0445 so heparin has been off since then.  May not need to adjust rate since it was only slightly low.  - May need to rebolus this pt depending on how long IV access is lost.    Alexyia Guarino D, PharmD 06/25/2021 6:27 AM

## 2021-06-25 NOTE — Evaluation (Signed)
Occupational Therapy Evaluation Patient Details Name: Sydney Evans MRN: [id number] DOB: 10-May-1950 Today's Date: 06/25/2021   History of Present Illness Pt is a 71 y.o. female presenting to hospital 2/16 with c/o severe R foot pain with discoloration.  Pt admitted with atherosclerotic occlusive disease B LE's with ischemia of R LE, chronic anemia, and htn.  S/p R LE angiogram/revascularization (R SFA stent and angioplasty of the anteiror tibial and peroneal artery) 06/21/21.  PMH includes h/o multiple strokes, h/o DVT, anemia, anxiety, bipolar disorder, CAD, contraction of joint of upper arm, htn, poor balance, vertigo, back surgery, B CTR, foot sx, B TKA, B TSA.   Clinical Impression   Sydney Evans was seen for OT evaluation this date. Prior to hospital admission, pt was MOD I using 4WW for ADLs and mobility. Pt lives alone in home c level entry. Upon arrival pt reporting no pain in R foot, eager for OOB. Pt requires SUPERVISION don underwear sit<>stand. SUPERVISION + RW for toilt t/f and grooming standing sink side - assist for lines mgmt and safe RW technique. Tolerates ~200 ft mobility in hallway, improves to no cues for RW use. No skilled OT needs identified. Will sign off. Please re-consult if additional OT needs arise.       Recommendations for follow up therapy are one component of a multi-disciplinary discharge planning process, led by the attending physician.  Recommendations may be updated based on patient status, additional functional criteria and insurance authorization.   Follow Up Recommendations  No OT follow up    Assistance Recommended at Discharge Set up Supervision/Assistance  Patient can return home with the following Assistance with cooking/housework;Assist for transportation    Functional Status Assessment  Patient has had a recent decline in their functional status and demonstrates the ability to make significant improvements in function in a reasonable and predictable  amount of time.  Equipment Recommendations  None recommended by OT    Recommendations for Other Services       Precautions / Restrictions Precautions Precautions: Fall Restrictions Weight Bearing Restrictions: No      Mobility Bed Mobility Overal bed mobility: Modified Independent             General bed mobility comments: HOB elevated    Transfers Overall transfer level: Needs assistance Equipment used: Rolling walker (2 wheels) Transfers: Sit to/from Stand Sit to Stand: Supervision           General transfer comment: from low toilet height using grab bar      Balance Overall balance assessment: Needs assistance Sitting-balance support: No upper extremity supported, Feet supported Sitting balance-Leahy Scale: Normal     Standing balance support: No upper extremity supported, During functional activity Standing balance-Leahy Scale: Good                             ADL either performed or assessed with clinical judgement   ADL                                         General ADL Comments: SUPERVISION don underwear sit<>stand. SUPERVISION + RW for toilt t/f and grooming standing sink side - assist for lines mgmt and safe RW technique.      Pertinent Vitals/Pain Pain Assessment Pain Assessment: No/denies pain     Hand Dominance Right   Extremity/Trunk Assessment Upper Extremity Assessment  Upper Extremity Assessment: Overall WFL for tasks assessed   Lower Extremity Assessment Lower Extremity Assessment: Overall WFL for tasks assessed       Communication Communication Communication: HOH   Cognition Arousal/Alertness: Awake/alert Behavior During Therapy: WFL for tasks assessed/performed Overall Cognitive Status: Within Functional Limits for tasks assessed                                        Home Living Family/patient expects to be discharged to:: Private residence Living Arrangements:  Alone Available Help at Discharge: Family;Neighbor;Available PRN/intermittently Type of Home: House Home Access: Level entry     Home Layout: One level     Bathroom Shower/Tub: Teacher, early years/pre: Standard     Home Equipment: Rollator (4 wheels);Shower seat;Grab bars - tub/shower;BSC/3in1   Additional Comments: Lives with dog "Ruby" who is being trained as a Theme park manager      Prior Functioning/Environment Prior Level of Function : Independent/Modified Independent             Mobility Comments: Ambulatory with 4ww; h/o falls          OT Problem List: Decreased activity tolerance;Decreased safety awareness         OT Goals(Current goals can be found in the care plan section) Acute Rehab OT Goals Patient Stated Goal: to go home OT Goal Formulation: With patient Time For Goal Achievement: 07/09/21 Potential to Achieve Goals: Good   AM-PAC OT "6 Clicks" Daily Activity     Outcome Measure Help from another person eating meals?: None Help from another person taking care of personal grooming?: A Little Help from another person toileting, which includes using toliet, bedpan, or urinal?: A Little Help from another person bathing (including washing, rinsing, drying)?: A Little Help from another person to put on and taking off regular upper body clothing?: None Help from another person to put on and taking off regular lower body clothing?: A Little 6 Click Score: 20   End of Session Equipment Utilized During Treatment: Rolling walker (2 wheels)  Activity Tolerance: Patient tolerated treatment well Patient left: in bed;with call bell/phone within reach  OT Visit Diagnosis: Other abnormalities of gait and mobility (R26.89)                Time: 1036-1050 OT Time Calculation (min): 14 min Charges:  OT General Charges $OT Visit: 1 Visit OT Evaluation $OT Eval Low Complexity: 1 Low  Dessie Coma, M.S. OTR/L  06/25/21, 11:01 AM  ascom [phone]

## 2021-06-25 NOTE — Progress Notes (Signed)
Physical Therapy Treatment Patient Details Name: Sydney Evans MRN: [id number] DOB: 05-14-1950 Today's Date: 06/25/2021   History of Present Illness Pt is a 71 y.o. female presenting to hospital 2/16 with c/o severe R foot pain with discoloration.  Pt admitted with atherosclerotic occlusive disease B LE's with ischemia of R LE, chronic anemia, and htn.  S/p R LE angiogram/revascularization (R SFA stent and angioplasty of the anteiror tibial and peroneal artery) 06/21/21.  PMH includes h/o multiple strokes, h/o DVT, anemia, anxiety, bipolar disorder, CAD, contraction of joint of upper arm, htn, poor balance, vertigo, back surgery, B CTR, foot sx, B TKA, B TSA.    PT Comments    Pt resting in bed upon PT arrival; pt reporting walking in hallway with OT earlier today.  7/10 R foot pain beginning/end of session (although pt reporting R foot feeling better/less pain end of session)--nurse notified.  During session pt modified independent with bed mobility; SBA with transfers; and CGA ambulating 200 feet with RW.  Pt steady with all functional mobility during session.  Mild antalgic gait noted during ambulation d/t R foot pain.  Significant improvement noted in functional mobility today: PT recommendations updated to no PT follow-up.    Recommendations for follow up therapy are one component of a multi-disciplinary discharge planning process, led by the attending physician.  Recommendations may be updated based on patient status, additional functional criteria and insurance authorization.  Follow Up Recommendations  No PT follow up     Assistance Recommended at Discharge PRN  Patient can return home with the following Assistance with cooking/housework;Assist for transportation   Equipment Recommendations  Rolling walker (2 wheels) (youth sized)    Recommendations for Other Services OT consult     Precautions / Restrictions Precautions Precautions: Fall Restrictions Weight Bearing  Restrictions: No     Mobility  Bed Mobility Overal bed mobility: Modified Independent             General bed mobility comments: HOB elevated    Transfers Overall transfer level: Needs assistance Equipment used: Rolling walker (2 wheels) Transfers: Sit to/from Stand Sit to Stand: Supervision           General transfer comment: steady safe transfer from bed x2 trials    Ambulation/Gait Ambulation/Gait assistance: Min guard Gait Distance (Feet): 200 Feet Assistive device: Rolling walker (2 wheels)   Gait velocity: decreased     General Gait Details: mild decreased stance time R LE; partial step through gait pattern; steady with RW use   Stairs             Wheelchair Mobility    Modified Rankin (Stroke Patients Only)       Balance Overall balance assessment: Needs assistance Sitting-balance support: No upper extremity supported, Feet supported Sitting balance-Leahy Scale: Normal Sitting balance - Comments: steady sitting reaching outside BOS   Standing balance support: No upper extremity supported Standing balance-Leahy Scale: Good Standing balance comment: steady standing reaching within BOS                            Cognition Arousal/Alertness: Awake/alert Behavior During Therapy: WFL for tasks assessed/performed Overall Cognitive Status: Within Functional Limits for tasks assessed                                          Exercises  General Comments  Nursing cleared pt for participation in physical therapy.  Pt agreeable to PT session.       Pertinent Vitals/Pain Pain Assessment Pain Assessment: No/denies pain Pain Score: 7  Pain Location: R foot Pain Descriptors / Indicators: Aching Pain Intervention(s): Limited activity within patient's tolerance, Monitored during session, Premedicated before session, Repositioned, Other (comment) (RN notified) Vitals (HR and O2 on room air) stable and WFL  throughout treatment session.    Home Living                          Prior Function            PT Goals (current goals can now be found in the care plan section) Acute Rehab PT Goals Patient Stated Goal: to go home PT Goal Formulation: With patient Time For Goal Achievement: 07/08/21 Potential to Achieve Goals: Good Progress towards PT goals: Progressing toward goals    Frequency    Min 2X/week      PT Plan Current plan remains appropriate    Co-evaluation              AM-PAC PT "6 Clicks" Mobility   Outcome Measure  Help needed turning from your back to your side while in a flat bed without using bedrails?: None Help needed moving from lying on your back to sitting on the side of a flat bed without using bedrails?: None Help needed moving to and from a bed to a chair (including a wheelchair)?: A Little Help needed standing up from a chair using your arms (e.g., wheelchair or bedside chair)?: A Little Help needed to walk in hospital room?: A Little Help needed climbing 3-5 steps with a railing? : A Little 6 Click Score: 20    End of Session Equipment Utilized During Treatment: Gait belt Activity Tolerance: Patient tolerated treatment well Patient left: in bed;with call bell/phone within reach;with bed alarm set Nurse Communication: Mobility status;Precautions;Other (comment) (pt's pain status and discoloration noted to toes) PT Visit Diagnosis: Other abnormalities of gait and mobility (R26.89);Muscle weakness (generalized) (M62.81);History of falling (Z91.81);Pain Pain - Right/Left: Right Pain - part of body: Ankle and joints of foot     Time: 1031-5945 PT Time Calculation (min) (ACUTE ONLY): 18 min  Charges:  $Gait Training: 8-22 mins                    Leitha Bleak, PT 06/25/21, 3:17 PM

## 2021-06-25 NOTE — Progress Notes (Signed)
[facility] Progress Note  Patient: Sydney Evans    TDD:[id number]  DOA: 06/21/2021     Date of Service: the patient was seen and examined on 06/25/2021  Chief Complaint  Patient presents with   Foot Pain   Brief hospital course: Sydney Evans is a 71 y.o. female with medical history significant of anemia, arthritis, DVT, Barrett's, hypertension, PAD, TIA, stroke presented to the emergency room today with complaints of severe right foot pain that had started 2 days prior port patient noticed discoloration with her foot becoming more blue with decreased sensation.  And over-the-counter regimens have not been helping. patient has no color Doppler flow to DP or PT and continues to have significant pain especially with palpation, with concern for critical limb ischemia vascular surgery was consulted and patient was taken to emergently to Cath Lab for revascularization. Postprocedure patient had a successful stent of SFA  Patient was admitted under hospitalist service, GI was consulted for chronic anemia and iron deficiency.  Further management as below.   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 Heparin GTT continued. Started hydrocodone as needed for pain control Continue IV fentanyl as needed for breakthrough pain  Further recommendations per Dr. Delana Meyer for IV heparin and when to start DAPT ?? 2/19 nitro patch on the foot ordered by vascular surgery for pain control   Iron deficiency anemia: Chart review shows that patient has been anemic from our records since 2018 with a hemoglobin of 10.6. Per patient she has been anemic since the age of 24. Anemia shows severe microcytosis and consistent with iron deficiency. Patient received multiple iron infusions in the past, recommend to follow with hematology as an outpatient.   Suspect chronic GI loss from NSAID use. GI consulted for severe anemia and suspected GI source.  Recommended  outpatient follow-up for EGD and colonoscopy thyroid functions wnl, anemia panel iron sat 5% and B12 190 low. Hb 6.5, s/p 1 unit of PRBC.  7.3 infusion, repeat Hb 8.2 Started Venofer 200 mg IV daily during hospital stay followed by oral iron supplement with vitamin C. Vitamin B12 level 190, target >400, started vitamin B12 1000 mcg IM injection during hospital stay followed by oral supplement. Follow with PCP to repeat iron profile and vitamin B12 level after 3 months Check stool sample for GI pathogen to rule out any parasitic infection 2/20 Hb 7.2, patient agreed for blood transfusion, 1 unit PRBC ordered Monitor H&H   Abdominal pain/GERD/duodenal nodule Patient does not report any abdominal pain however on exam has generalized vague abdominal pain.  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 No significant narrowing of the superior mesenteric artery. No significant abnormality of the mesenteric veins. Mild colonic diverticulosis.  Unchanged mild intrahepatic biliary dilatation, predominately in the left hepatic lobe. Continue PPI 40 mg IV twice daily Resumed diet, we will follow for tolerance      Essential hypertension: Patient denies taking any blood pressure medication. With her history of vascular disease and ongoing tobacco abuse we will start patient on  Amlodipine 5 mg p.o. daily. continue hydralazine IV prn.  EKG NSR. 2/20 increased amlodipine from 5 to 10 mg due to persistent high blood pressure    Hypotonic hyponatremia sodium 131--132--130 Serum osmolality 267, patient drinks excessive water, fluid restriction advised 1.5 L/day we will continue to monitor most likely nutritional deficiency.  Hypokalemia, potassium repleted.  We will continue to  monitor and replete as needed.  Mild acidosis, CO2 19, started oral bicarbonate supplement.  Hypoglycemia could be secondary to poor nutrition Started on hypoglycemia  protocol Continue to monitor FSBG    Body mass index is 27.07 kg/m.  Interventions:       Diet: Regular diet DVT Prophylaxis: Therapeutic Anticoagulation with heparin IV infusion    Advance goals of care discussion: DNR  Family Communication: family was NOT present at bedside, at the time of interview.  The pt provided permission to discuss medical plan with the family. Opportunity was given to ask question and all questions were answered satisfactorily.   Disposition:  Pt is from home, admitted with right foot ischemia s/p SFA stent on IV heparin infusion, still has significant pain on IV fentanyl, which precludes a safe discharge. Discharge to home, when when clinically stable, may require few days more to improve.  Subjective: No significant overnight events, patient still has intermittent pain but pain improved with pain medications.  Patient denies any other complaints.  Still has poor appetite and drinks excessive water.  Patient was encouraged to eat more proteins and drink less water.   Physical Exam: General:  alert oriented to time, place, and person.  Appear in mild distress, affect appropriate Eyes: PERRLA ENT: Oral Mucosa Clear, moist  Neck: no JVD,  Cardiovascular: S1 and S2 Present, no Murmur,  Respiratory: good respiratory effort, Bilateral Air entry equal and Decreased, no Crackles, no wheezes Abdomen: Bowel Sound present, Soft and no tenderness,  Skin: no rashes Extremities: no Pedal edema, no calf tenderness, right foot does cold, tender to touch, no erythema or any sign of infection Neurologic: without any new focal findings Gait not checked due to patient safety concerns  Vitals:   06/24/21 2350 06/25/21 0430 06/25/21 0739 06/25/21 1619  BP: (!) 172/69 135/69 (!) 168/68 (!) 149/69  Pulse: 81 71 64 66  Resp: 15 18 16 18   Temp: 98.1 F (36.7 C) 98.1 F (36.7 C) 98.1 F (36.7 C) 97.9 F (36.6 C)  TempSrc:   Oral   SpO2: 97% 97% 98% 100%  Weight:       Height:        Intake/Output Summary (Last 24 hours) at 06/25/2021 1713 Last data filed at 06/25/2021 1300 Gross per 24 hour  Intake 1126.01 ml  Output --  Net 1126.01 ml   Filed Weights   06/21/21 1455 06/21/21 1557  Weight: 66.7 kg 67.1 kg    Data Reviewed: I have personally reviewed and interpreted daily labs, tele strips, imagings as discussed above. I reviewed all nursing notes, pharmacy notes, vitals, pertinent old records I have discussed plan of care as described above with RN and patient/family.  CBC: Recent Labs  Lab 06/21/21 1500 06/21/21 1616 06/22/21 0258 06/22/21 1152 06/23/21 0630 06/23/21 1639 06/24/21 0537 06/25/21 0521 06/25/21 1426  WBC 6.0 5.6 5.6  --  5.3  --  5.1 6.4  --   NEUTROABS 4.1  --   --   --   --   --   --   --   --   HGB 6.7* 6.5* 7.3*   < > 7.9* 7.5* 7.4* 7.2* 7.6*  HCT 25.0* 23.6* 25.8*   < > 27.3* 26.0* 26.0* 24.4* 26.8*  MCV 69.6* 68.6* 71.1*  --  70.4*  --  70.5* 71.1*  --   PLT 472* 444* 357  --  341  --  400 354  --    < > = values  in this interval not displayed.   Basic Metabolic Panel: Recent Labs  Lab 06/21/21 1500 06/22/21 0859 06/23/21 0630 06/23/21 1639 06/24/21 0537 06/25/21 0521  NA 131*  --  132* 126* 132* 130*  K 3.5  --  3.0* 3.7 3.5 3.3*  CL 104  --  99 97* 101 100  CO2 20*  --  19* 23 22 22   GLUCOSE 91  --  68* 112* 98 99  BUN 13  --  9 9 8  7*  CREATININE 0.69  --  0.52 0.61 0.58 0.69  CALCIUM 8.5*  --  8.9 8.7* 8.8* 8.6*  MG  --  1.8 2.0  --  2.0 1.8  PHOS  --  3.2 3.6  --  3.7 3.8    Studies: No results found.  Scheduled Meds:  sodium chloride   Intravenous Once   amLODipine  5 mg Oral Daily   cyanocobalamin  1,000 mcg Intramuscular Daily   Followed by   Derrill Memo ON 06/29/2021] vitamin B-12  500 mcg Oral Daily   famotidine  40 mg Oral QHS   nitroGLYCERIN  0.2 mg Transdermal Daily   pantoprazole  40 mg Intravenous Daily   polyethylene glycol  17 g Oral Daily   sodium chloride flush  3 mL  Intravenous Q12H   sodium chloride  1 g Oral TID WC   Continuous Infusions:  sodium chloride     heparin 1,250 Units/hr (06/25/21 0912)   iron sucrose Stopped (06/25/21 1220)   PRN Meds: sodium chloride, acetaminophen, bisacodyl, diphenhydrAMINE, fentaNYL (SUBLIMAZE) injection, hydrALAZINE, HYDROmorphone, naLOXone (NARCAN)  injection, ondansetron (ZOFRAN) IV, sodium chloride flush  Time spent: 35 minutes  Author: Val Riles. MD [facility] 06/25/2021 5:13 PM  To reach On-call, see care teams to locate the attending and reach out to them via [email or web address]. If 7PM-7AM, please contact night-coverage If you still have difficulty reaching the attending provider, please page the [facility] (Director on Call) for [facility] on amion for assistance.

## 2021-06-25 NOTE — Consult Note (Addendum)
[facility] for IV Heparin Indication:  Limb ischemia / arterial occlusion  Patient Measurements: Height: 5\' 2"  (157.5 cm) Weight: 67.1 kg (148 lb) IBW/kg (Calculated) : 50.1 Heparin Dosing Weight: 64 kg  Labs: Recent Labs    06/23/21 0630 06/23/21 1639 06/24/21 0537 06/24/21 1252 06/24/21 1848 06/25/21 0521  HGB 7.9* 7.5* 7.4*  --   --  7.2*  HCT 27.3* 26.0* 26.0*  --   --  24.4*  PLT 341  --  400  --   --  354  HEPARINUNFRC 0.55  --  0.25* 0.62 0.45 0.27*  CREATININE 0.52 0.61 0.58  --   --  0.69     Estimated Creatinine Clearance: 60.5 mL/min (by C-G formula based on SCr of 0.69 mg/dL).   Medical History: Past Medical History:  Diagnosis Date   Anemia    Anxiety    Arthritis    Barrett's esophagus    Bipolar disorder ([place])    Blocked artery    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   Blood clot in vein    Cancer ([place])    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   Carotid arterial disease ([facility])    Contracture of joint of upper arm    Depression    Elevated lipids    GERD (gastroesophageal reflux disease)    Hypertension    Migraine    Osteoporosis    Poor balance    Sinus congestion    Stroke ([facility])    x 2   TIA (transient ischemic attack)    Vertigo     Medications:  No anticoagulation prior to admission per my chart review  Assessment: Patient is a [name] with medical history as above who presented to the ED 2/16 complaining of right foot pain with associated toe discoloration. RN un-able to locate pulses. Suspicion for arterial occlusion. Pharmacy consulted to initiate heparin infusion for arterial occlusion.   Baseline aPTT 28 and PT-INR 1.0. Baseline CBC notable for Hgb 6.7 which appears roughly c/w patient's baseline which appears to be about 7.5. Over past ~2 days, Hgb consistently at 7.4-7.6. Plts WNL.  Date/HL HL Rate/comment 2/17 @ 0258 0.33 Therapeutic 2/17 @ 0859 0.26 Subtherapeutic 2/17 @1651  0.59 Therapeutic.  2/17 @2244     0.62     Therapeutic x  2  2/18 @0630  0.55 Therapeutic.  2/19 @0537     0.25     Subtherapeutic 2/19 @1252  0.62 Therapeutic 2/19 @1848  0.45 Therapeutic x 2 2/20 @0521     0.27     SUBtherapeutic 2/20 @1426  <0.10 SUBtherapeutic   Goal of Therapy:  Heparin level 0.3-0.7 units/ml Monitor platelets by anticoagulation protocol: Yes   Plan: 2/20 @1426 . HL <0.10. SUBtherapeutic -Heparin bolus 1900 units -Increase heparin gtt to 1450 units/hr.  -6 hour HL  -Daily HL, CBC.  Wynelle Cleveland, PharmD 06/25/2021 8:22 AM

## 2021-06-26 LAB — TYPE AND SCREEN
ABO/RH(D): O POS
Antibody Screen: NEGATIVE
Unit division: 0

## 2021-06-26 LAB — CBC
HCT: 27.6 % — ABNORMAL LOW (ref 36.0–46.0)
Hemoglobin: 8.3 g/dL — ABNORMAL LOW (ref 12.0–15.0)
MCH: 22.4 pg — ABNORMAL LOW (ref 26.0–34.0)
MCHC: 30.1 g/dL (ref 30.0–36.0)
MCV: 74.6 fL — ABNORMAL LOW (ref 80.0–100.0)
Platelets: 323 10*3/uL (ref 150–400)
RBC: 3.7 MIL/uL — ABNORMAL LOW (ref 3.87–5.11)
RDW: 25.1 % — ABNORMAL HIGH (ref 11.5–15.5)
WBC: 5.2 10*3/uL (ref 4.0–10.5)
nRBC: 0 % (ref 0.0–0.2)

## 2021-06-26 LAB — BASIC METABOLIC PANEL
Anion gap: 7 (ref 5–15)
BUN: 9 mg/dL (ref 8–23)
CO2: 23 mmol/L (ref 22–32)
Calcium: 8.8 mg/dL — ABNORMAL LOW (ref 8.9–10.3)
Chloride: 104 mmol/L (ref 98–111)
Creatinine, Ser: 0.67 mg/dL (ref 0.44–1.00)
GFR, Estimated: >60 mL/min (ref >60)
Glucose, Bld: 104 mg/dL — ABNORMAL HIGH (ref 70–99)
Potassium: 4 mmol/L (ref 3.5–5.1)
Sodium: 134 mmol/L — ABNORMAL LOW (ref 135–145)

## 2021-06-26 LAB — HEPARIN LEVEL (UNFRACTIONATED): Heparin Unfractionated: 0.48 IU/mL (ref 0.30–0.70)

## 2021-06-26 LAB — BPAM RBC
Blood Product Expiration Date: 202303242359
ISSUE DATE / TIME: 202302201742
Unit Type and Rh: 5100

## 2021-06-26 MED ORDER — CLOPIDOGREL BISULFATE 75 MG PO TABS: 75.0000 mg | ORAL_TABLET | Freq: Every day | ORAL | 11 refills | Status: DC

## 2021-06-26 MED ORDER — CLOPIDOGREL BISULFATE 75 MG PO TABS
75.0000 mg | ORAL_TABLET | Freq: Every day | ORAL | Status: DC
Start: 2021-06-27 — End: 2021-06-26

## 2021-06-26 MED ORDER — ASPIRIN 81 MG PO TBEC: 81.0000 mg | DELAYED_RELEASE_TABLET | Freq: Every day | ORAL | 11 refills | Status: DC

## 2021-06-26 MED ORDER — PANTOPRAZOLE SODIUM 40 MG PO TBEC
40.0000 mg | DELAYED_RELEASE_TABLET | Freq: Every day | ORAL | Status: DC
  Administered 2021-06-26: 40 mg via ORAL
  Filled 2021-06-26: qty 1

## 2021-06-26 MED ORDER — NITROGLYCERIN 0.2 MG/HR TD PT24: 0.2000 mg | MEDICATED_PATCH | Freq: Every day | TRANSDERMAL | 1 refills | Status: DC

## 2021-06-26 MED ORDER — HYDROMORPHONE HCL 2 MG PO TABS: 2.0000 mg | ORAL_TABLET | Freq: Four times a day (QID) | ORAL | 0 refills | Status: DC | PRN

## 2021-06-26 MED ORDER — ASPIRIN EC 81 MG PO TBEC: 81.0000 mg | DELAYED_RELEASE_TABLET | Freq: Every day | ORAL | Status: DC

## 2021-06-26 MED ORDER — ASPIRIN EC 81 MG PO TBEC
162.0000 mg | DELAYED_RELEASE_TABLET | ORAL | Status: AC
  Administered 2021-06-26: 162 mg via ORAL
  Filled 2021-06-26: qty 2

## 2021-06-26 MED ORDER — CLOPIDOGREL BISULFATE 75 MG PO TABS
150.0000 mg | ORAL_TABLET | ORAL | Status: AC
  Administered 2021-06-26: 150 mg via ORAL
  Filled 2021-06-26: qty 2

## 2021-06-26 MED ORDER — CYANOCOBALAMIN 500 MCG PO TABS: 500.0000 ug | ORAL_TABLET | Freq: Every day | ORAL | 0 refills | Status: DC

## 2021-06-26 NOTE — Discharge Instructions (Signed)
Patient advised to follow-up with her PCP and wanted to

## 2021-06-26 NOTE — Consult Note (Signed)
[facility] for IV Heparin Indication:  Limb ischemia / arterial occlusion  Patient Measurements: Height: 5\' 2"  (157.5 cm) Weight: 67.1 kg (148 lb) IBW/kg (Calculated) : 50.1 Heparin Dosing Weight: 64 kg  Labs: Recent Labs    06/24/21 0537 06/24/21 1252 06/25/21 0521 06/25/21 1426 06/25/21 2208 06/26/21 0516  HGB 7.4*  --  7.2* 7.6*  --  8.3*  HCT 26.0*  --  24.4* 26.8*  --  27.6*  PLT 400  --  354  --   --  323  HEPARINUNFRC 0.25*   < > 0.27* <0.10* 0.37 0.48  CREATININE 0.58  --  0.69  --   --  0.67   < > = values in this interval not displayed.     Estimated Creatinine Clearance: 60.5 mL/min (by C-G formula based on SCr of 0.67 mg/dL).   Medical History: Past Medical History:  Diagnosis Date   Anemia    Anxiety    Arthritis    Barrett's esophagus    Bipolar disorder ([place])    Blocked artery    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   Blood clot in vein    Cancer ([place])    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   Carotid arterial disease (HCC)    Contracture of joint of upper arm    Depression    Elevated lipids    GERD (gastroesophageal reflux disease)    Hypertension    Migraine    Osteoporosis    Poor balance    Sinus congestion    Stroke (HCC)    x 2   TIA (transient ischemic attack)    Vertigo     Medications:  No anticoagulation prior to admission per my chart review  Assessment: Patient is a [name] with medical history as above who presented to the ED 2/16 complaining of right foot pain with associated toe discoloration. RN un-able to locate pulses. Suspicion for arterial occlusion. Pharmacy consulted to initiate heparin infusion for arterial occlusion.   Baseline aPTT 28 and PT-INR 1.0. Baseline CBC notable for Hgb 6.7 which appears roughly c/w patient's baseline which appears to be about 7.5. Over past ~2 days, Hgb consistently at 7.4-7.6. Plts WNL.  Date/HL HL Rate/comment 2/17 @ 0258 0.33 Therapeutic 2/17 @ 0859 0.26 Subtherapeutic 2/17  @1651  0.59 Therapeutic.  2/17 @2244     0.62     Therapeutic x 2  2/18 @0630  0.55 Therapeutic.  2/19 @0537     0.25     Subtherapeutic 2/19 @1252  0.62 Therapeutic 2/19 @1848  0.45 Therapeutic x 2 2/20 @0521     0.27     SUBtherapeutic 2/20 @1426  <0.10 SUBtherapeutic 2/20 @2208  0.37 Therapeutic x 1 2/21 @0516  0.48 Therapeutic x 2   Goal of Therapy:  Heparin level 0.3-0.7 units/ml Monitor platelets by anticoagulation protocol: Yes   Plan:  -Continue heparin infusion at gtt to 1450 units/hr.  -Recheck HL daily while therapeutic on heparin -CBC daily while on heparin  Renda Rolls, PharmD, [facility] 06/26/2021 6:26 AM

## 2021-06-26 NOTE — TOC Transition Note (Signed)
Transition of Care [facility]) - CM/SW Discharge Note   Patient Details  Name: Sydney Evans MRN: [id number] Date of Birth: [birth year]  [facility] of [facility]) CM/SW Contact:  Kerin Salen, RN Phone Number: 06/26/2021, 4:12 PM   Clinical Narrative: To discharge home with out patient [facility], referral list given by Nurse. TOC needs resolved.     Final next level of care: Home/Self Care Barriers to Discharge: Barriers Resolved   Patient Goals and CMS Choice Patient states their goals for this hospitalization and ongoing recovery are:: To return home.      Discharge Placement                    Patient and family notified of of transfer: 06/26/21  Discharge Plan and Services                                     Social Determinants of Health (SDOH) Interventions     Readmission Risk Interventions No flowsheet data found.

## 2021-06-26 NOTE — Progress Notes (Signed)
Physical Therapy Treatment Patient Details Name: Sydney Evans MRN: [id number] DOB: [birth year] Today's Date: 06/26/2021   History of Present Illness Pt is a 71 y.o. female presenting to hospital 2/16 with c/o severe R foot pain with discoloration.  Pt admitted with atherosclerotic occlusive disease B LE's with ischemia of R LE, chronic anemia, and htn.  S/p R LE angiogram/revascularization (R SFA stent and angioplasty of the anteiror tibial and peroneal artery) 06/21/21.  PMH includes h/o multiple strokes, h/o DVT, anemia, anxiety, bipolar disorder, CAD, contraction of joint of upper arm, htn, poor balance, vertigo, back surgery, B CTR, foot sx, B TKA, B TSA.    PT Comments    Pt tolerated treatment well today and was able to improve overall ambulation distance and assist levels from last session. Pt required supervision for transfers and supervision-CGA for safety with gait in RW. Despite progress, pt continues to demonstrate increased pain levels, R foot paresthesias, antalgic gait, and decreased tolerance of RLE stance which increases her risk of falls and limits her independence with self care ADL's and IADL's. Pt will have daughter to assist short term upon DC. Mild improvements in pain noted with ambulation. Will update DC recommendation to include HHPT for return to baseline function, then referral to OPPT (after finishing HHPT) for continued progress towards goals and maintenance of symptoms. I believe pt will be appropriate for HHPT services at this time as she does not have reliable access to transportation at the moment. Pt agreeable. Will also recommend youth RW and 3in1 for energy conservation and safety with mobility at home.      Recommendations for follow up therapy are one component of a multi-disciplinary discharge planning process, led by the attending physician.  Recommendations may be updated based on patient status, additional functional criteria and insurance  authorization.  Follow Up Recommendations  Home health PT     Assistance Recommended at Discharge PRN  Patient can return home with the following Assistance with cooking/housework;Assist for transportation   Equipment Recommendations  Rolling walker (2 wheels);BSC/3in1 (youth sized)    Recommendations for Other Services OT consult     Precautions / Restrictions Precautions Precautions: Fall Restrictions Weight Bearing Restrictions: No     Mobility  Bed Mobility               General bed mobility comments: seated EOB upon entry    Transfers Overall transfer level: Needs assistance Equipment used: Rolling walker (2 wheels)   Sit to Stand: Supervision           General transfer comment: steady safe transfer from EOB and low height commode, with and without RW    Ambulation/Gait Ambulation/Gait assistance: Supervision, Min guard Gait Distance (Feet): 480 Feet Assistive device: Rolling walker (2 wheels)   Gait velocity: decreased     General Gait Details: Intermittent assist ranging from supervision-CGA for safety. Demonstrates narrow BOS, antalgic gait with decreased R stance and L step length, decreased RW proximity, intermittent veering, and increased unsteadiness with turns. Verbal cues for widened BOS and RW proximity.      Balance Overall balance assessment: Needs assistance Sitting-balance support: No upper extremity supported, Feet supported Sitting balance-Leahy Scale: Normal Sitting balance - Comments: steady sitting reaching outside BOS   Standing balance support: No upper extremity supported Standing balance-Leahy Scale: Good Standing balance comment: steady standing reaching within BOS  Cognition Arousal/Alertness: Awake/alert Behavior During Therapy: WFL for tasks assessed/performed Overall Cognitive Status: Within Functional Limits for tasks assessed                                           Exercises Other Exercises Other Exercises: Bed mobility, transfers, toileting, hand washing, and gait. Improved quality of gait with RW. Antalgic gait. Other Exercises: Educated re: PT role/POC, DC recommendations, safety with mobility, activity pacing/energy conservation, pain management. She verbalized understanding.        Pertinent Vitals/Pain Pain Assessment Pain Score: 7  Pain Location: R foot Pain Descriptors / Indicators: Aching (stinging) Pain Intervention(s): Limited activity within patient's tolerance, Monitored during session, Repositioned     PT Goals (current goals can now be found in the care plan section) Acute Rehab PT Goals Patient Stated Goal: to go home PT Goal Formulation: With patient Time For Goal Achievement: 07/08/21 Potential to Achieve Goals: Good Progress towards PT goals: Progressing toward goals    Frequency    Min 2X/week      PT Plan Current plan remains appropriate;Discharge plan needs to be updated       AM-PAC PT "6 Clicks" Mobility   Outcome Measure  Help needed turning from your back to your side while in a flat bed without using bedrails?: None Help needed moving from lying on your back to sitting on the side of a flat bed without using bedrails?: None Help needed moving to and from a bed to a chair (including a wheelchair)?: A Little Help needed standing up from a chair using your arms (e.g., wheelchair or bedside chair)?: A Little Help needed to walk in hospital room?: A Little Help needed climbing 3-5 steps with a railing? : A Little 6 Click Score: 20    End of Session Equipment Utilized During Treatment: Gait belt Activity Tolerance: Patient tolerated treatment well Patient left: in bed;with call bell/phone within reach Nurse Communication: Mobility status;Precautions;Other (comment) (pt's pain status and discoloration noted to toes) PT Visit Diagnosis: Other abnormalities of gait and mobility (R26.89);Muscle  weakness (generalized) (M62.81);History of falling (Z91.81);Pain Pain - Right/Left: Right Pain - part of body: Ankle and joints of foot     Time: 0955-1008 PT Time Calculation (min) (ACUTE ONLY): 13 min  Charges:  $Therapeutic Activity: 8-22 mins                     Herminio Commons, PT, DPT 11:29 AM,06/26/21

## 2021-06-26 NOTE — Discharge Summary (Addendum)
Physician Discharge Summary   Patient: Sydney Evans MRN: [id number] DOB: [birth year]  Admit date:     06/21/2021  Discharge date: 06/26/21  Discharge Physician: Fritzi Mandes   PCP: Pcp, No   Recommendations at discharge:   follow-up with Dr. Delana Meyer on your appointment follow-up with her primary care physician. You will need to call and make appointment in 1 to 2 weeks.  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 as outpatient   Hospital Course:  Sydney Evans is a 71 y.o. female with medical history significant of anemia, arthritis, DVT, Barrett's, hypertension, PAD, TIA, stroke presented to the emergency room today with complaints of severe right foot pain that had started 2 days prior port patient noticed discoloration with her foot becoming more blue with decreased sensation.  And over-the-counter regimens have not been helping.  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 Heparin GTT continued.--- Evaluated by Dr. Delana Meyer. Change to aspirin and Plavix prior to discharge. -- Hemoglobin stable at 8.3 -- patient will follow-up with Dr. Delana Meyer as outpatient -- will give few oral Dilaudid to help with pain  chronic iron deficiency anemia -- patient follows with hematology Dr. Tasia Catchings as outpatient -- she has had multiple iron infusions in the past -- seen by Dr. Virgina Jock recommends patient follow-up with G.I. as outpatient -- hemoglobin stable at 8.3 -- received IV iron and couple units of blood transfusion -- avoid NSAIDs  [name] -- continue PPI  Hypertension -- resume propranolol  history of bipolar disorder -- resumed her psych meds  seen by physical therapy--- recommends outpatient PT and OT. TOC to make arrangement  patient will discharged to home. She is in agreement with plan.        Consultants: Dr. Delana Meyer Procedures performed: right lower extremity angiogram with stent placement and SFA Disposition: Home Diet recommendation:  Discharge Diet Orders (From admission, onward)     Start     Ordered   06/26/21 0000  Diet - low sodium heart healthy        06/26/21 1054           Cardiac diet  DISCHARGE MEDICATION: Allergies as of 06/26/2021       Reactions   Hydroxyzine Hives, Rash   Gabapentin    Dizzy and confusion   Dilaudid [hydromorphone] Itching   Hydrocodone Hives, Rash   "terrible scratching"  Make take with benadryl   Ketorolac Rash    May take with benadryl   Percocet [oxycodone-acetaminophen] Itching, Rash   May take with benadryl     Toradol [ketorolac Tromethamine] Rash   May take with benadryl        Medication List     STOP taking these medications    amoxicillin-clavulanate 875-125 MG tablet Commonly known as: AUGMENTIN   diclofenac Sodium 1 % Gel Commonly known as: Voltaren   ibuprofen 800 MG tablet Commonly known as: ADVIL   polyethylene glycol powder 17 GM/SCOOP powder Commonly known as: GLYCOLAX/MIRALAX   prochlorperazine 10 MG tablet Commonly known as: COMPAZINE       TAKE these medications    aspirin 81 MG EC tablet Take 1 tablet (81 mg total) by mouth daily. Swallow whole. Start taking on: June 27, 2021   benzonatate 200 MG capsule Commonly known as: TESSALON Take 200 mg by mouth 3 (three) times daily as needed.   citalopram 40  MG tablet Commonly known as: CELEXA Take 1.5 tablets (60 mg total) by mouth daily.   clopidogrel 75 MG tablet Commonly known as: PLAVIX Take 1 tablet (75 mg total) by mouth daily. Start taking on: June 27, 2021   diphenhydrAMINE 25 MG tablet Commonly known as: BENADRYL Take 25 mg by mouth every 6 (six) hours as needed for itching.   fluticasone 50 MCG/ACT nasal spray Commonly known as: FLONASE Place 2 sprays into both nostrils daily as needed for allergies  or rhinitis.   HYDROmorphone 2 MG tablet Commonly known as: DILAUDID Take 1 tablet (2 mg total) by mouth every 6 (six) hours as needed for severe pain.   lamoTRIgine 200 MG tablet Commonly known as: LAMICTAL Take 1 tablet (200 mg total) by mouth daily.   LUBRICANT EYE DROPS OP Place 1 drop into both eyes daily as needed (Dry eye). Systane   nitroGLYCERIN 0.2 mg/hr patch Commonly known as: NITRODUR - Dosed in mg/24 hr Place 1 patch (0.2 mg total) onto the skin daily. Start taking on: June 27, 2021   nortriptyline 25 MG capsule Commonly known as: PAMELOR Take 25 mg by mouth at bedtime.   omeprazole 40 MG capsule Commonly known as: PRILOSEC Take 40 mg by mouth daily before breakfast. Take 30 minutes before breakfast.   propranolol 20 MG tablet Commonly known as: INDERAL Take 20 mg by mouth 2 (two) times daily.   Travoprost (BAK Free) 0.004 % Soln ophthalmic solution Commonly known as: TRAVATAN Place 1 drop into both eyes at bedtime.   vitamin B-12 500 MCG tablet Commonly known as: CYANOCOBALAMIN Take 1 tablet (500 mcg total) by mouth daily. Start taking on: June 29, 2021               Durable Medical Equipment  (From admission, onward)           Start     Ordered   06/26/21 1034  For home use only DME 3 n 1  Once        06/26/21 1033   06/26/21 1033  For home use only DME Walker rolling  Once       Question Answer Comment  Walker: With 5 Inch Wheels   Patient needs a walker to treat with the following condition Weakness      06/26/21 1033            Follow-up Information     Schnier, Dolores Lory, MD Follow up in 2 week(s).   Specialties: Vascular Surgery, Cardiology, Radiology, Vascular Surgery Why: follow up after procedure with an ABI Contact information: [address] [place] [phone]                 Discharge Exam: Filed Weights   06/21/21 1455 06/21/21 1557  Weight: 66.7 kg 67.1 kg     Condition  at discharge: fair  The results of significant diagnostics from this hospitalization (including imaging, microbiology, ancillary and laboratory) are listed below for reference.   Imaging Studies: PERIPHERAL VASCULAR CATHETERIZATION  Result Date: 06/21/2021 See surgical note for result.  CT Angio Abd/Pel w/ and/or w/o  Result Date: 06/22/2021 CLINICAL DATA:  Mesenteric ischemia Post EXAM: CTA ABDOMEN AND PELVIS WITHOUT AND WITH CONTRAST TECHNIQUE: Multidetector CT imaging of the abdomen and pelvis was performed using the standard protocol during bolus administration of intravenous contrast. Multiplanar reconstructed images and MIPs were obtained and reviewed to evaluate the vascular anatomy. RADIATION DOSE REDUCTION: This exam was performed according to the  departmental dose-optimization program which includes automated exposure control, adjustment of the mA and/or kV according to patient size and/or use of iterative reconstruction technique. CONTRAST:  49mL OMNIPAQUE IOHEXOL 350 MG/ML SOLN COMPARISON:  CT abdomen pelvis with contrast 05/02/2021 FINDINGS: VASCULAR Aorta: Atherosclerotic calcifications seen throughout the abdominal aorta without aneurysm or flow-limiting stenosis. Celiac: Gastrosplenic trunk is patent. There is mild stenosis at the origin. SMA: No significant stenosis. The common hepatic artery is replaced originating from the superior insert artery. Renals: No significant stenosis of the renal arteries. IMA: Mild narrowing at the origin.  Otherwise patent. Inflow: Mild narrowing at the origin of the left common iliac artery due to calcified plaque. Inflow segments otherwise unremarkable. Proximal Outflow: Right SFA stent partially visualized. Atherosclerotic calcifications are seen in the common femoral arteries without flow-limiting stenosis. Veins: Hepatic, portal, superior mesenteric, and splenic veins are patent. Review of the MIP images confirms the above findings. NON-VASCULAR Lower  chest: Mild bibasilar atelectasis. 4 mm subpleural nodule opacity in the left lateral lung base (24/8) is not significantly changed since 10/02/2019 which indicates a benign etiology. Hepatobiliary: Postsurgical changes of cholecystectomy. Mild intrahepatic biliary dilatation, particularly left hepatic lobe is not significantly changed dating back to 10/02/2019. liver otherwise unremarkable. Pancreas: Unremarkable. No pancreatic ductal dilatation or surrounding inflammatory changes. Spleen: Normal in size without focal abnormality. Adrenals/Urinary Tract: Mild diffuse thickening of the adrenal glands without discrete nodularity. No significant abnormality of the kidneys, ureters, or bladder. Stomach/Bowel: Scattered colonic diverticula. No bowel dilatation to indicate ileus or obstruction. Lymphatic: No enlarged abdominal or pelvic lymph nodes. Reproductive: Status post hysterectomy. No adnexal masses. Other: Small amount of subcutaneous emphysema in the left inguinal region likely related to recent vascular intervention of the right lower extremity. No fluid collection identified in the left inguinal soft tissues to indicate seroma or abscess. Musculoskeletal: Vertebral augmentation changes partially visualized in the T10 vertebral body. Degenerative changes seen throughout the lower lumbar spine. No acute osseous abnormality. IMPRESSION: VASCULAR 1. Mild stenosis at the origin of the celiac and inferior mesenteric arteries. No significant narrowing of the superior mesenteric artery. 2. No significant abnormality of the mesenteric veins. NON-VASCULAR 1. Mild colonic diverticulosis. 2. Unchanged mild intrahepatic biliary dilatation, predominately in the left hepatic lobe. Electronically Signed   By: Miachel Roux M.D.   On: 06/22/2021 09:37    Microbiology: Results for orders placed or performed during the hospital encounter of 06/21/21  Resp Panel by RT-PCR (Flu A&B, Covid) Nasopharyngeal Swab     Status: None    Collection Time: 06/21/21  3:53 PM   Specimen: Nasopharyngeal Swab; Nasopharyngeal(NP) swabs in vial transport medium  Result Value Ref Range Status   SARS Coronavirus 2 by RT PCR NEGATIVE NEGATIVE Final    Comment: (NOTE) SARS-CoV-2 target nucleic acids are NOT DETECTED.  The SARS-CoV-2 RNA is generally detectable in upper respiratory specimens during the acute phase of infection. The lowest concentration of SARS-CoV-2 viral copies this assay can detect is 138 copies/mL. A negative result does not preclude SARS-Cov-2 infection and should not be used as the sole basis for treatment or other patient management decisions. A negative result may occur with  improper specimen collection/handling, submission of specimen other than nasopharyngeal swab, presence of viral mutation(s) within the areas targeted by this assay, and inadequate number of viral copies(<138 copies/mL). A negative result must be combined with clinical observations, patient history, and epidemiological information. The expected result is Negative.  Fact Sheet for Patients:  EntrepreneurPulse.com.au  Fact Sheet for  Healthcare Providers:  IncredibleEmployment.be  This test is no t yet approved or cleared by the [place] and  has been authorized for detection and/or diagnosis of SARS-CoV-2 by FDA under an Emergency Use Authorization (EUA). This EUA will remain  in effect (meaning this test can be used) for the duration of the COVID-19 declaration under Section 564(b)(1) of the Act, 21 U.S.C.section 360bbb-3(b)(1), unless the authorization is terminated  or revoked sooner.       Influenza A by PCR NEGATIVE NEGATIVE Final   Influenza B by PCR NEGATIVE NEGATIVE Final    Comment: (NOTE) The Xpert Xpress SARS-CoV-2/FLU/RSV plus assay is intended as an aid in the diagnosis of influenza from Nasopharyngeal swab specimens and should not be used as a sole basis for treatment.  Nasal washings and aspirates are unacceptable for Xpert Xpress SARS-CoV-2/FLU/RSV testing.  Fact Sheet for Patients: EntrepreneurPulse.com.au  Fact Sheet for Healthcare Providers: IncredibleEmployment.be  This test is not yet approved or cleared by the [place] FDA and has been authorized for detection and/or diagnosis of SARS-CoV-2 by FDA under an Emergency Use Authorization (EUA). This EUA will remain in effect (meaning this test can be used) for the duration of the COVID-19 declaration under Section 564(b)(1) of the Act, 21 U.S.C. section 360bbb-3(b)(1), unless the authorization is terminated or revoked.  Performed at [facility], [address] [place], [place] [address]     Labs: CBC: Recent Labs  Lab 06/21/21 1500 06/21/21 1616 06/22/21 0258 06/22/21 1152 06/23/21 0630 06/23/21 1639 06/24/21 0537 06/25/21 0521 06/25/21 1426 06/26/21 0516  WBC 6.0   < > 5.6  --  5.3  --  5.1 6.4  --  5.2  NEUTROABS 4.1  --   --   --   --   --   --   --   --   --   HGB 6.7*   < > 7.3*   < > 7.9* 7.5* 7.4* 7.2* 7.6* 8.3*  HCT 25.0*   < > 25.8*   < > 27.3* 26.0* 26.0* 24.4* 26.8* 27.6*  MCV 69.6*   < > 71.1*  --  70.4*  --  70.5* 71.1*  --  74.6*  PLT 472*   < > 357  --  341  --  400 354  --  323   < > = values in this interval not displayed.   Basic Metabolic Panel: Recent Labs  Lab 06/22/21 0859 06/23/21 0630 06/23/21 1639 06/24/21 0537 06/25/21 0521 06/26/21 0516  NA  --  132* 126* 132* 130* 134*  K  --  3.0* 3.7 3.5 3.3* 4.0  CL  --  99 97* 101 100 104  CO2  --  19* 23 22 22 23   GLUCOSE  --  68* 112* 98 99 104*  BUN  --  9 9 8  7* 9  CREATININE  --  0.52 0.61 0.58 0.69 0.67  CALCIUM  --  8.9 8.7* 8.8* 8.6* 8.8*  MG 1.8 2.0  --  2.0 1.8  --   PHOS 3.2 3.6  --  3.7 3.8  --    Liver Function Tests: Recent Labs  Lab 06/21/21 1500  AST 13*  ALT 9  ALKPHOS 72  BILITOT 0.3  PROT 7.2  ALBUMIN 3.3*    CBG: Recent Labs  Lab 06/21/21 2006 06/22/21 0444 06/23/21 0855 06/23/21 0910  GLUCAP 107* 98 69* 103*    Discharge time spent: greater than 30 minutes.  Signed: Fritzi Mandes,  MD [facility] 06/26/2021

## 2021-06-27 ENCOUNTER — Telehealth: Payer: Self-pay | Admitting: *Deleted

## 2021-06-27 NOTE — Telephone Encounter (Signed)
Transition Care Management Unsuccessful Follow-up Telephone Call  Date of discharge and from where:  [facility] 06-26-2021  Attempts:  1st Attempt  Reason for unsuccessful TCM follow-up call:  Left voice message

## 2021-06-28 NOTE — Telephone Encounter (Signed)
Transition Care Management Unsuccessful Follow-up Telephone Call  Date of discharge and from where:  [facility]   Attempts:  2nd Attempt  Reason for unsuccessful TCM follow-up call:  Left voice message      

## 2021-06-29 ENCOUNTER — Telehealth (INDEPENDENT_AMBULATORY_CARE_PROVIDER_SITE_OTHER): Payer: Self-pay

## 2021-06-29 NOTE — Telephone Encounter (Signed)
I made the pt aware of the NP instructions she agreed and wants to keep her appt the same.

## 2021-06-29 NOTE — Telephone Encounter (Signed)
The pt called and left a VM on the nurse line saying she was having pain in her RLE. I called the pt and  upon calling her she made me aware that  she had an angio on 2/16 on the RLE and her foot is swollen and blue and on  that same RT side her arm is swollen and has a lump.  She has no wounds on the foot or arm and says the pain feel like frost bite pt denied and numbness .The pt is scheduled to follow up with U/S and Visit on 3/13@10 :30 AM  this appt needs to probably be moved up. Please advise.

## 2021-06-29 NOTE — Telephone Encounter (Signed)
As was explained to the patient while she was in the hospital, She has very poor circulation in the right foot that we will not be able to fix due to the vessels being too small.  Her foot is likely swollen due to the increased blood flow but unfortunately there is not enough to perfuse the foot.  We can have the patient come in sooner to discuss amputation as we noted in the hospital if she would like.  If she wants to come in sooner she can see Dr. Delana Meyer

## 2021-07-04 DIAGNOSIS — F17200 Nicotine dependence, unspecified, uncomplicated: Secondary | ICD-10-CM | POA: Insufficient documentation

## 2021-07-16 ENCOUNTER — Encounter (INDEPENDENT_AMBULATORY_CARE_PROVIDER_SITE_OTHER): Payer: Medicare Other

## 2021-07-16 ENCOUNTER — Ambulatory Visit (INDEPENDENT_AMBULATORY_CARE_PROVIDER_SITE_OTHER): Payer: Medicare Other | Admitting: Vascular Surgery

## 2021-07-17 ENCOUNTER — Ambulatory Visit: Payer: Medicare Other | Admitting: Family

## 2021-07-27 ENCOUNTER — Telehealth: Payer: Self-pay

## 2021-07-27 NOTE — Telephone Encounter (Signed)
Pt called in needs a new pt // hospital follow up appointment sooner than what's available . Spoke with Sydney Evans please let me know if its possible  ? ?

## 2021-07-30 NOTE — Telephone Encounter (Signed)
Sorry for not being clear  the patient had a new pt appointment on 07/17/21 and did not make it because she was in the hospital but now need a hospital  f/u and there is no availability until May . Wanted to know if she can be fit in to your schedule sooner ?

## 2021-07-31 NOTE — Telephone Encounter (Signed)
Spoke with pt to schedule appointment stated she will call back needs to speak to her transportation  ?

## 2021-08-01 ENCOUNTER — Telehealth: Payer: Self-pay

## 2021-08-01 ENCOUNTER — Ambulatory Visit (INDEPENDENT_AMBULATORY_CARE_PROVIDER_SITE_OTHER): Payer: Medicare Other | Admitting: Family

## 2021-08-01 ENCOUNTER — Other Ambulatory Visit: Payer: Self-pay

## 2021-08-01 ENCOUNTER — Encounter: Payer: Self-pay | Admitting: Family

## 2021-08-01 VITALS — BP 144/72 | HR 55 | Temp 98.1°F | Ht 62.0 in

## 2021-08-01 DIAGNOSIS — R2681 Unsteadiness on feet: Secondary | ICD-10-CM | POA: Insufficient documentation

## 2021-08-01 DIAGNOSIS — F319 Bipolar disorder, unspecified: Secondary | ICD-10-CM

## 2021-08-01 DIAGNOSIS — G546 Phantom limb syndrome with pain: Secondary | ICD-10-CM

## 2021-08-01 DIAGNOSIS — Z89511 Acquired absence of right leg below knee: Secondary | ICD-10-CM

## 2021-08-01 DIAGNOSIS — H4010X0 Unspecified open-angle glaucoma, stage unspecified: Secondary | ICD-10-CM

## 2021-08-01 DIAGNOSIS — D500 Iron deficiency anemia secondary to blood loss (chronic): Secondary | ICD-10-CM | POA: Diagnosis not present

## 2021-08-01 DIAGNOSIS — Z736 Limitation of activities due to disability: Secondary | ICD-10-CM

## 2021-08-01 DIAGNOSIS — M810 Age-related osteoporosis without current pathological fracture: Secondary | ICD-10-CM

## 2021-08-01 DIAGNOSIS — Z741 Need for assistance with personal care: Secondary | ICD-10-CM

## 2021-08-01 DIAGNOSIS — K5903 Drug induced constipation: Secondary | ICD-10-CM

## 2021-08-01 DIAGNOSIS — E876 Hypokalemia: Secondary | ICD-10-CM

## 2021-08-01 DIAGNOSIS — M6259 Muscle wasting and atrophy, not elsewhere classified, multiple sites: Secondary | ICD-10-CM | POA: Insufficient documentation

## 2021-08-01 DIAGNOSIS — E559 Vitamin D deficiency, unspecified: Secondary | ICD-10-CM | POA: Diagnosis not present

## 2021-08-01 DIAGNOSIS — K219 Gastro-esophageal reflux disease without esophagitis: Secondary | ICD-10-CM | POA: Insufficient documentation

## 2021-08-01 DIAGNOSIS — I10 Essential (primary) hypertension: Secondary | ICD-10-CM

## 2021-08-01 DIAGNOSIS — E782 Mixed hyperlipidemia: Secondary | ICD-10-CM

## 2021-08-01 DIAGNOSIS — R52 Pain, unspecified: Secondary | ICD-10-CM

## 2021-08-01 DIAGNOSIS — G8929 Other chronic pain: Secondary | ICD-10-CM | POA: Insufficient documentation

## 2021-08-01 DIAGNOSIS — I70221 Atherosclerosis of native arteries of extremities with rest pain, right leg: Secondary | ICD-10-CM | POA: Diagnosis not present

## 2021-08-01 HISTORY — DX: Phantom limb syndrome with pain: G54.6

## 2021-08-01 LAB — CBC WITH DIFFERENTIAL/PLATELET
Basophils Absolute: 0 10*3/uL (ref 0.0–0.1)
Basophils Relative: 1.1 % (ref 0.0–3.0)
Eosinophils Absolute: 0.1 10*3/uL (ref 0.0–0.7)
Eosinophils Relative: 2.9 % (ref 0.0–5.0)
HCT: 30.1 % — ABNORMAL LOW (ref 36.0–46.0)
Hemoglobin: 9.6 g/dL — ABNORMAL LOW (ref 12.0–15.0)
Lymphocytes Relative: 23.7 % (ref 12.0–46.0)
Lymphs Abs: 1 10*3/uL (ref 0.7–4.0)
MCHC: 31.9 g/dL (ref 30.0–36.0)
MCV: 83.4 fl (ref 78.0–100.0)
Monocytes Absolute: 0.3 10*3/uL (ref 0.1–1.0)
Monocytes Relative: 5.9 % (ref 3.0–12.0)
Neutro Abs: 2.9 10*3/uL (ref 1.4–7.7)
Neutrophils Relative %: 66.4 % (ref 43.0–77.0)
Platelets: 336 10*3/uL (ref 150.0–400.0)
RBC: 3.6 Mil/uL — ABNORMAL LOW (ref 3.87–5.11)
RDW: 23.8 % — ABNORMAL HIGH (ref 11.5–15.5)
WBC: 4.4 10*3/uL (ref 4.0–10.5)

## 2021-08-01 LAB — BASIC METABOLIC PANEL
BUN: 8 mg/dL (ref 6–23)
CO2: 24 mEq/L (ref 19–32)
Calcium: 8.8 mg/dL (ref 8.4–10.5)
Chloride: 105 mEq/L (ref 96–112)
Creatinine, Ser: 0.7 mg/dL (ref 0.40–1.20)
GFR: 88.58 mL/min (ref >60.00)
Glucose, Bld: 93 mg/dL (ref 70–99)
Potassium: 3.7 mEq/L (ref 3.5–5.1)
Sodium: 137 mEq/L (ref 135–145)

## 2021-08-01 LAB — IBC + FERRITIN
Ferritin: 24.2 ng/mL (ref 10.0–291.0)
Iron: 36 ug/dL — ABNORMAL LOW (ref 42–145)
Saturation Ratios: 8.9 % — ABNORMAL LOW (ref 20.0–50.0)
TIBC: 404.6 ug/dL (ref 250.0–450.0)
Transferrin: 289 mg/dL (ref 212.0–360.0)

## 2021-08-01 LAB — VITAMIN D 25 HYDROXY (VIT D DEFICIENCY, FRACTURES): VITD: 48.74 ng/mL (ref 30.00–100.00)

## 2021-08-01 LAB — B12 AND FOLATE PANEL
Folate: 19.4 ng/mL (ref >5.9)
Vitamin B-12: 319 pg/mL (ref 211–911)

## 2021-08-01 MED ORDER — GABAPENTIN 300 MG PO CAPS: 300.0000 mg | ORAL_CAPSULE | Freq: Four times a day (QID) | ORAL | 3 refills | Status: DC

## 2021-08-01 NOTE — Progress Notes (Signed)
? ?Established Patient Office Visit ? ?Subjective:  ?Patient ID: Sydney Evans, female    DOB: 11-Oct-1950  Age: 71 y.o. MRN: [id number] ? ?CC:  ?Chief Complaint  ?Patient presents with  ? Hospitalization Follow-up  ? Establish Care  ? ? ?HPI ?Sydney Evans is here for hospital follow up and also here to establish care. ?Admitted to [facility], 2/24 and had surgery 2/27. Inpatient until 3/9 when she was discharged to SNF at [facility] in [place].  ? ?Accompanied by her adopted niece Joni Reining, currently patient lives alone.  ?Also accompanied by her nieces mom who is also her friend, she stays with her throughout the day since she lives alone. Pt currently in a wheelchair.  ? 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 Was requested to f/u with vascular surgeon for wound eval in three weeks.  ? Was taking plavix but no longer taking as completed required course per pt.  ? ?F/u with vascular surgeon is with Dr. Allayne Stack on March 31 at 1030 am  ? ?Orders were given for physical therapy  ? ?[facility] came, and met with her in their home Saturday, Hazle Nordmann, RN. She is working with home health nursing for wound care as well as getting physical therapy and OT initiated.  ? ?Bipolar: diagnosed and on medication 'for years' stable on celexa 40 mg once daily as well as daily lamictal 300 mg once daily.  ? ?HTN: taking amlodipine 5 mg once daily. Today blood pressure 144/72. Denies cp palp and or sob.  ?  ?Glaucoma: pt states used to be taking travaoprost however d/c and has not taken since 2018 because she ran out and hasn't seen opthalmology since. She denies blurry vision.  ? ?NSVT: was taking propanolol in the past but was unable to continue as heart rate runs lows chronically.  ? ? ? ?Past Medical History:  ?Diagnosis Date  ? 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 Anemia    ? Anxiety   ? Arthritis   ? Barrett's esophagus   ? Bipolar disorder ([place])   ? Blocked artery   ? 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  ? Blood clot in vein   ? Cancer [facility])   ? 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  ? Carotid arterial disease ([place])   ? Contracture of joint of upper arm   ? Critical limb ischemia of right lower extremity ([place]) 06/21/2021  ? Depression   ? Dislocation of right shoulder joint 10/06/2019  ? Disorder of bone and articular cartilage 09/29/2014  ? Elevated lipids   ? GERD (gastroesophageal reflux disease)   ? Hypertension   ? Migraine   ? Osteoporosis   ? Poor balance   ? Rotator cuff tendinitis, left 05/20/2016  ? Sinus congestion   ? 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 Stroke [facility])   ? x 2  ? TIA (transient ischemic attack)   ? Traumatic complete tear of right rotator cuff 10/08/2019  ? Vertigo   ? ? ?Past Surgical History:  ?Procedure Laterality Date  ? ABDOMINAL HYSTERECTOMY    ? BACK SURGERY    ? BREAST BIOPSY Left   ? CARPAL TUNNEL RELEASE Bilateral   ? CATARACT EXTRACTION W/ INTRAOCULAR LENS  IMPLANT, BILATERAL Bilateral   ? CHOLECYSTECTOMY    ? COLONOSCOPY WITH PROPOFOL N/A  04/09/2018  ? Procedure: COLONOSCOPY WITH PROPOFOL;  Surgeon: Jonathon Bellows, MD;  Location: [facility] ENDOSCOPY;  Service: Gastroenterology;  Laterality: N/A;  ? crystal cyst removed on left foot    ? ESOPHAGOGASTRODUODENOSCOPY (EGD) WITH PROPOFOL N/A 04/09/2018  ? Procedure: ESOPHAGOGASTRODUODENOSCOPY (EGD) WITH PROPOFOL;  Surgeon: Jonathon Bellows, MD;  Location: [facility] ENDOSCOPY;  Service: Gastroenterology;  Laterality: N/A;  ? ESOPHAGOGASTRODUODENOSCOPY (EGD) WITH PROPOFOL N/A 12/14/2019  ? Procedure: ESOPHAGOGASTRODUODENOSCOPY (EGD) WITH PROPOFOL;  Surgeon: Lesly Rubenstein, MD;  Location: [facility] ENDOSCOPY;  Service: Endoscopy;  Laterality: N/A;  ? EYE SURGERY    ? femoral fx    ? FOOT SURGERY    ? GIVENS CAPSULE STUDY N/A 06/16/2018  ? Procedure: GIVENS CAPSULE STUDY;  Surgeon: Jonathon Bellows, MD;   Location: [facility] ENDOSCOPY;  Service: Gastroenterology;  Laterality: N/A;  ? JOINT REPLACEMENT    ? KNEE SURGERY    ? LOWER EXTREMITY ANGIOGRAPHY Right 06/21/2021  ? Procedure: Lower Extremity Angiography;  Surgeon: Katha Cabal, MD;  Location: [place] CV LAB;  Service: Cardiovascular;  Laterality: Right;  ? ORIF PERIPROSTHETIC FRACTURE Right 10/07/2019  ? Procedure: OPEN REDUCTION INTERNAL FIXATION (ORIF) PERIPROSTHETIC FRACTURE;  Surgeon: Corky Mull, MD;  Location: [facility] ORS;  Service: Orthopedics;  Laterality: Right;  ? TONSILLECTOMY    ? TOTAL KNEE ARTHROPLASTY Bilateral   ? TOTAL SHOULDER ARTHROPLASTY Left 08/01/2016  ? Procedure: TOTAL SHOULDER ARTHROPLASTY;  Surgeon: Corky Mull, MD;  Location: [facility] ORS;  Service: Orthopedics;  Laterality: Left;  ? TOTAL SHOULDER ARTHROPLASTY Right 09/30/2019  ? Procedure: TOTAL SHOULDER ARTHROPLASTY;  Surgeon: Corky Mull, MD;  Location: [facility] ORS;  Service: Orthopedics;  Laterality: Right;  ? ? ?Family History  ?Problem Relation Age of Onset  ? Other Mother   ?     ?lupus   ? Cancer Mother   ? High Cholesterol Mother   ? CAD Father   ?     CABG  ? High Cholesterol Father   ? Arthritis Father   ? Cancer Sister   ? Heart murmur Sister   ? Heart murmur Brother   ? High blood pressure Other   ?     "for everybody"  ? High Cholesterol Other   ?     "for everybody"  ? ? ?Social History  ? ?Socioeconomic History  ? Marital status: Single  ?  Spouse name: Not on file  ? Number of children: 3  ? Years of education: Not on file  ? Highest education level: Some college, no degree  ?Occupational History  ? Not on file  ?Tobacco Use  ? Smoking status: Every Day  ?  Packs/day: 0.50  ?  Types: Cigarettes  ?  Last attempt to quit: 08/04/2016  ?  Years since quitting: 5.0  ? Smokeless tobacco: Never  ?Vaping Use  ? Vaping Use: Never used  ?Substance and Sexual Activity  ? Alcohol use: No  ? Drug use: No  ? Sexual activity: Not Currently  ?  Birth control/protection: Surgical  ?Other  Topics Concern  ? Not on file  ?Social History Narrative  ? Lives at home alone in an apt  ? Right handed  ? Disabled since 1998  ? Caffeine: about 30 oz daily  ? ?Social Determinants of Health  ? ?Financial Resource Strain: Not on file  ?Food Insecurity: Not on file  ?Transportation Needs: Not on file  ?Physical Activity: Not on file  ?Stress: Not on file  ?Social Connections: Not on file  ?  Intimate Partner Violence: Not on file  ? ? ?Outpatient Medications Prior to Visit  ?Medication Sig Dispense Refill  ? acetaminophen (TYLENOL) 500 MG tablet Take 500 mg by mouth every 6 (six) hours as needed.    ? amLODipine (NORVASC) 5 MG tablet Take 1 tablet by mouth daily.    ? aspirin EC 81 MG EC tablet Take 1 tablet (81 mg total) by mouth daily. Swallow whole. 30 tablet 11  ? atorvastatin (LIPITOR) 40 MG tablet Take 40 mg by mouth daily.    ? Carboxymethylcellulose Sodium (LUBRICANT EYE DROPS OP) Place 1 drop into both eyes daily as needed (Dry eye). Systane    ? citalopram (CELEXA) 40 MG tablet Take 1.5 tablets (60 mg total) by mouth daily. 90 tablet 0  ? diphenhydrAMINE (BENADRYL) 25 MG tablet Take 25 mg by mouth every 6 (six) hours as needed for itching.    ? fluticasone (FLONASE) 50 MCG/ACT nasal spray Place 2 sprays into both nostrils daily as needed for allergies or rhinitis. 16 g 12  ? lamoTRIgine (LAMICTAL) 200 MG tablet Take 1 tablet (200 mg total) by mouth daily. 90 tablet 0  ? methocarbamol (ROBAXIN) 500 MG tablet Take 500 mg by mouth 4 (four) times daily.    ? omeprazole (PRILOSEC) 40 MG capsule Take 40 mg by mouth daily before breakfast. Take 30 minutes before breakfast.    ? Oxycodone HCl 10 MG TABS Take by mouth.    ? vitamin B-12 (CYANOCOBALAMIN) 500 MCG tablet Take 1 tablet (500 mcg total) by mouth daily. 30 tablet 0  ? gabapentin (NEURONTIN) 100 MG capsule Take by mouth.    ? HYDROmorphone (DILAUDID) 2 MG tablet Take 1 tablet (2 mg total) by mouth every 6 (six) hours as needed for severe pain. 15 tablet 0   ? nortriptyline (PAMELOR) 25 MG capsule Take 25 mg by mouth at bedtime.    ? propranolol (INDERAL) 20 MG tablet Take 20 mg by mouth 2 (two) times daily.    ? Travoprost, BAK Free, (TRAVATAN) 0.004

## 2021-08-01 NOTE — Telephone Encounter (Signed)
Faxed sent 08/01/2021 for [facility]. ?

## 2021-08-01 NOTE — Patient Instructions (Addendum)
A referral was placed today for opthalmology.  ?Please let us know if you have not heard back within 1 week about your referral. ? ?A referral was placed today for psychiatry.  ?Please let us know if you have not heard back within 1 week about your referral. ? ?Stop by the lab prior to leaving today. I will notify you of your results once received.  ? ?It was a pleasure seeing you today! Please do not hesitate to reach out with any questions and or concerns. ? ?Regards,  ? ?Devante Capano ?FNP-C ?. ? ? ? ?

## 2021-08-02 ENCOUNTER — Telehealth: Payer: Self-pay

## 2021-08-02 NOTE — Telephone Encounter (Signed)
Called patient reviewed all information and repeated back to me. Will call if any questions. Pt stated she would not take iron because she said it makes her sick and ill.  ? ?

## 2021-08-02 NOTE — Progress Notes (Signed)
Anemia has improved since one month ago blood draw, iron still low but slowly improving. Continue iron supplement if not currently taking, then start. Sodium has improved as well. Calcium still a bit low, if not taking daily calcium recommend vitamin d calcium combined supplement once daily. Lastly, b12 on the lower end of range, recommend [phone] mcg once daily of b12.

## 2021-08-07 DIAGNOSIS — Z9181 History of falling: Secondary | ICD-10-CM

## 2021-08-07 DIAGNOSIS — Z7982 Long term (current) use of aspirin: Secondary | ICD-10-CM

## 2021-08-07 DIAGNOSIS — Z89511 Acquired absence of right leg below knee: Secondary | ICD-10-CM

## 2021-08-07 DIAGNOSIS — I10 Essential (primary) hypertension: Secondary | ICD-10-CM | POA: Diagnosis not present

## 2021-08-07 DIAGNOSIS — Z96653 Presence of artificial knee joint, bilateral: Secondary | ICD-10-CM

## 2021-08-07 DIAGNOSIS — Z87440 Personal history of urinary (tract) infections: Secondary | ICD-10-CM

## 2021-08-07 DIAGNOSIS — Z4781 Encounter for orthopedic aftercare following surgical amputation: Secondary | ICD-10-CM | POA: Diagnosis not present

## 2021-08-07 DIAGNOSIS — D509 Iron deficiency anemia, unspecified: Secondary | ICD-10-CM

## 2021-08-07 DIAGNOSIS — F319 Bipolar disorder, unspecified: Secondary | ICD-10-CM

## 2021-08-07 DIAGNOSIS — I6521 Occlusion and stenosis of right carotid artery: Secondary | ICD-10-CM | POA: Diagnosis not present

## 2021-08-07 DIAGNOSIS — Z96643 Presence of artificial hip joint, bilateral: Secondary | ICD-10-CM

## 2021-08-07 DIAGNOSIS — Z7902 Long term (current) use of antithrombotics/antiplatelets: Secondary | ICD-10-CM

## 2021-08-07 DIAGNOSIS — I69354 Hemiplegia and hemiparesis following cerebral infarction affecting left non-dominant side: Secondary | ICD-10-CM

## 2021-08-07 DIAGNOSIS — I739 Peripheral vascular disease, unspecified: Secondary | ICD-10-CM

## 2021-08-07 DIAGNOSIS — Z87891 Personal history of nicotine dependence: Secondary | ICD-10-CM

## 2021-08-07 DIAGNOSIS — M810 Age-related osteoporosis without current pathological fracture: Secondary | ICD-10-CM

## 2021-08-07 DIAGNOSIS — Z86718 Personal history of other venous thrombosis and embolism: Secondary | ICD-10-CM

## 2021-08-07 DIAGNOSIS — K219 Gastro-esophageal reflux disease without esophagitis: Secondary | ICD-10-CM

## 2021-08-07 DIAGNOSIS — H9192 Unspecified hearing loss, left ear: Secondary | ICD-10-CM

## 2021-08-07 DIAGNOSIS — N390 Urinary tract infection, site not specified: Secondary | ICD-10-CM | POA: Diagnosis not present

## 2021-08-14 ENCOUNTER — Encounter: Payer: Self-pay | Admitting: *Deleted

## 2021-08-16 ENCOUNTER — Telehealth: Payer: Self-pay

## 2021-08-16 NOTE — Telephone Encounter (Signed)
FYI   Social worker will be delay until next week unable to get to [place].

## 2021-08-17 ENCOUNTER — Encounter: Payer: Self-pay | Admitting: Family

## 2021-08-17 NOTE — Assessment & Plan Note (Signed)
Order bmp pending results ?

## 2021-08-17 NOTE — Assessment & Plan Note (Signed)
I have reached out to [facility] for home care ?

## 2021-08-17 NOTE — Assessment & Plan Note (Signed)
Refilled amlodipine 5 mg once daily. ?Pt advised of the following:  ?Continue medication as prescribed. Monitor blood pressure periodically and/or when you feel symptomatic. Goal is <130/90 on average. Ensure that you have rested for 30 minutes prior to checking your blood pressure. Record your readings and bring them to your next visit if necessary.work on a low sodium diet. ? ?

## 2021-08-17 NOTE — Assessment & Plan Note (Addendum)
Continue with pain medication as prescribed as needed ?Refill gabapentin 300 mg ?

## 2021-08-17 NOTE — Assessment & Plan Note (Signed)
?  Post/op BKA. patient advised to watch stump for signs and symptoms of infection daily to let me know if any of these are to occur.  Follow-up with vascular for ongoing management and treatment of this extremity. ?

## 2021-08-17 NOTE — Assessment & Plan Note (Signed)
Overdue for ophthalmologic exam advised patient to follow-up with ophthalmologist. ?Referral was also placed as needed ?

## 2021-08-17 NOTE — Assessment & Plan Note (Addendum)
Take oxycodone as prescribed. ?Continue f/u with vascular.  ? ?Extensive time going over hospital records, speaking with pt of acute and chronic concerns, gathering valid history, going over recent labs and imaging from hospital and time spent talking with [facility] representatives for home care totaling 54 minutes. ?

## 2021-08-17 NOTE — Assessment & Plan Note (Signed)
Called and spoke with [facility] as well as with [facility] who is in regards to home care.  Paperwork will be sent over for me to sign and I will get this back to them as soon as possible.  Let them know that there is urgency to need for home care. ?

## 2021-08-17 NOTE — Assessment & Plan Note (Addendum)
Ambulatory referral to psychiatry placed today and patient advised that she needs to see a psychiatrist for her bipolar medications.  Continue Celexa 40 mg and Lamictal 300 mg once daily. ?Did recommend patient see a psychologist as well however she declines for now. ?

## 2021-08-17 NOTE — Assessment & Plan Note (Signed)
Refilled lipitor 40 mg  Work on low cholesterol diet and exercise as tolerated ? ?

## 2021-08-17 NOTE — Assessment & Plan Note (Signed)
Daily stool softener MiraLAX as needed. ?

## 2021-08-20 ENCOUNTER — Ambulatory Visit (INDEPENDENT_AMBULATORY_CARE_PROVIDER_SITE_OTHER): Payer: Medicare Other | Admitting: Family

## 2021-08-20 ENCOUNTER — Encounter: Payer: Self-pay | Admitting: Family

## 2021-08-20 VITALS — BP 152/72 | HR 43 | Temp 98.6°F | Resp 16 | Ht 62.0 in

## 2021-08-20 DIAGNOSIS — L02214 Cutaneous abscess of groin: Secondary | ICD-10-CM | POA: Diagnosis not present

## 2021-08-20 DIAGNOSIS — I70221 Atherosclerosis of native arteries of extremities with rest pain, right leg: Secondary | ICD-10-CM | POA: Diagnosis not present

## 2021-08-20 DIAGNOSIS — B372 Candidiasis of skin and nail: Secondary | ICD-10-CM | POA: Insufficient documentation

## 2021-08-20 DIAGNOSIS — G546 Phantom limb syndrome with pain: Secondary | ICD-10-CM

## 2021-08-20 DIAGNOSIS — I10 Essential (primary) hypertension: Secondary | ICD-10-CM

## 2021-08-20 DIAGNOSIS — F5222 Female sexual arousal disorder: Secondary | ICD-10-CM

## 2021-08-20 DIAGNOSIS — M62838 Other muscle spasm: Secondary | ICD-10-CM | POA: Insufficient documentation

## 2021-08-20 DIAGNOSIS — Z89511 Acquired absence of right leg below knee: Secondary | ICD-10-CM

## 2021-08-20 DIAGNOSIS — F319 Bipolar disorder, unspecified: Secondary | ICD-10-CM

## 2021-08-20 DIAGNOSIS — E782 Mixed hyperlipidemia: Secondary | ICD-10-CM

## 2021-08-20 MED ORDER — LAMOTRIGINE 200 MG PO TABS: 200.0000 mg | ORAL_TABLET | Freq: Every day | ORAL | 0 refills | Status: DC

## 2021-08-20 MED ORDER — AMLODIPINE BESYLATE 5 MG PO TABS: 5.0000 mg | ORAL_TABLET | Freq: Every day | ORAL | 11 refills | Status: DC

## 2021-08-20 MED ORDER — DOXYCYCLINE HYCLATE 100 MG PO TABS: 100.0000 mg | ORAL_TABLET | Freq: Two times a day (BID) | ORAL | 0 refills | Status: AC

## 2021-08-20 MED ORDER — ATORVASTATIN CALCIUM 40 MG PO TABS: 40.0000 mg | ORAL_TABLET | Freq: Every day | ORAL | 1 refills | Status: DC

## 2021-08-20 MED ORDER — GABAPENTIN 300 MG PO CAPS: 300.0000 mg | ORAL_CAPSULE | Freq: Four times a day (QID) | ORAL | 1 refills | Status: DC

## 2021-08-20 MED ORDER — CLOTRIMAZOLE-BETAMETHASONE 1-0.05 % EX CREA: 1.0000 "application " | TOPICAL_CREAM | Freq: Every day | CUTANEOUS | 0 refills | Status: DC

## 2021-08-20 NOTE — Assessment & Plan Note (Addendum)
Improving, pt without robaxin x 1 week ?Ok to d/c as long as phantom pain does not increase in frequency ?Refill gabapentin 300 mg tid  ?

## 2021-08-20 NOTE — Assessment & Plan Note (Signed)
Refill lamotrigine 300 mg  ?Advised pt she will need to f/u with psychiatrist for future refills ?Filling today as do not want pt to go without ?

## 2021-08-20 NOTE — Patient Instructions (Addendum)
A referral was placed today for urogyn for your concerns.  ?Please let us know if you have not heard back within 2 weeks about the referra ? ?Don't forget to make appointment with psychiatry as well be unable to refill lamotrigine at next refill. ? ?Sent in cream for fatty folds as well as antibiotic to take by mouth for the abscess. ?Use warm compresses.  ? ?Due to recent changes in healthcare laws, you may see results of your imaging and/or laboratory studies on MyChart before I have had a chance to review them.  I understand that in some cases there may be results that are confusing or concerning to you. Please understand that not all results are received at the same time and often I may need to interpret multiple results in order to provide you with the best plan of care or course of treatment. Therefore, I ask that you please give me 2 business days to thoroughly review all your results before contacting my office for clarification. Should we see a critical lab result, you will be contacted sooner.  ? ?It was a pleasure seeing you today! Please do not hesitate to reach out with any questions and or concerns. ? ?Regards,  ? ?Diyana Starrett ?FNP-C ? ?

## 2021-08-20 NOTE — Assessment & Plan Note (Signed)
rx lotrisone cream for inguinal fold sites  ?Keep area dry as able  ?

## 2021-08-20 NOTE — Assessment & Plan Note (Signed)
Continue f/u with vascular as scheduled ?Continue with wound changes at home, monitor site for s/s infection ?

## 2021-08-20 NOTE — Assessment & Plan Note (Signed)
New concern,  ?uro vs gyn? Referral placed for urogyn  ?Possible puodenol nerve stimulation? ?

## 2021-08-20 NOTE — Progress Notes (Signed)
? ?Established Patient Office Visit ? ?Subjective:  ?Patient ID: Sydney Evans, female    DOB: 1950/06/22  Age: 71 y.o. MRN: [id number] ? ?CC:  ?Chief Complaint  ?Patient presents with  ? Cyst  ?  X 1 raised  3 or 4 more coming up . Pelvis area.  ? ? ?HPI ?Sydney Evans is here today with concerns.  ? ?Some infected areas on lower abdomen , tender to touch. Not draining anything at this time.  ?Seem to be increasing in size. Not itching.  ? ?Does have f/u with vascular surgeon this week with Dr. Clydene Laming. No longer on plavix. ? ?When she is peeing feels like she is having an orgasm, and she doesn't know why this is happening. She states that she 'cums' when she urinates. She doesn't burn when she pees. No urinary frequency or urgency. When she has a bowel movement she does not have the same sensation.  ? ?Wants handicap placard.  ? ?Past Medical History:  ?Diagnosis Date  ? 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 Anemia   ? Anxiety   ? Arthritis   ? Barrett's esophagus   ? Bipolar disorder ([place])   ? Blocked artery   ? 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  ? Blood clot in vein   ? Cancer [facility])   ? 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  ? Carotid arterial disease ([place])   ? Contracture of joint of upper arm   ? Critical limb ischemia of right lower extremity ([place]) 06/21/2021  ? Depression   ? Dislocation of right shoulder joint 10/06/2019  ? Disorder of bone and articular cartilage 09/29/2014  ? Elevated lipids   ? GERD (gastroesophageal reflux disease)   ? Hypertension   ? Migraine   ? Osteoporosis   ? Poor balance   ? Rotator cuff tendinitis, left 05/20/2016  ? Sinus congestion   ? 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 Stroke [facility])   ? x 2  ? TIA (transient ischemic attack)   ? Traumatic complete tear of right rotator cuff 10/08/2019  ? Vertigo   ? ? ?Past Surgical History:  ?Procedure Laterality Date  ? ABDOMINAL HYSTERECTOMY    ? BACK SURGERY    ? BREAST BIOPSY Left   ? CARPAL  TUNNEL RELEASE Bilateral   ? CATARACT EXTRACTION W/ INTRAOCULAR LENS  IMPLANT, BILATERAL Bilateral   ? CHOLECYSTECTOMY    ? COLONOSCOPY WITH PROPOFOL N/A 04/09/2018  ? Procedure: COLONOSCOPY WITH PROPOFOL;  Surgeon: Jonathon Bellows, MD;  Location: [facility] ENDOSCOPY;  Service: Gastroenterology;  Laterality: N/A;  ? crystal cyst removed on left foot    ? ESOPHAGOGASTRODUODENOSCOPY (EGD) WITH PROPOFOL N/A 04/09/2018  ? Procedure: ESOPHAGOGASTRODUODENOSCOPY (EGD) WITH PROPOFOL;  Surgeon: Jonathon Bellows, MD;  Location: [facility] ENDOSCOPY;  Service: Gastroenterology;  Laterality: N/A;  ? ESOPHAGOGASTRODUODENOSCOPY (EGD) WITH PROPOFOL N/A 12/14/2019  ? Procedure: ESOPHAGOGASTRODUODENOSCOPY (EGD) WITH PROPOFOL;  Surgeon: Lesly Rubenstein, MD;  Location: [facility] ENDOSCOPY;  Service: Endoscopy;  Laterality: N/A;  ? EYE SURGERY    ? femoral fx    ? FOOT SURGERY    ? GIVENS CAPSULE STUDY N/A 06/16/2018  ? Procedure: GIVENS CAPSULE STUDY;  Surgeon: Jonathon Bellows, MD;  Location: [facility] ENDOSCOPY;  Service: Gastroenterology;  Laterality: N/A;  ? JOINT REPLACEMENT    ? KNEE SURGERY    ? LOWER EXTREMITY ANGIOGRAPHY Right 06/21/2021  ? Procedure: Lower Extremity Angiography;  Surgeon: Katha Cabal, MD;  Location: [facility] INVASIVE CV  LAB;  Service: Cardiovascular;  Laterality: Right;  ? ORIF PERIPROSTHETIC FRACTURE Right 10/07/2019  ? Procedure: OPEN REDUCTION INTERNAL FIXATION (ORIF) PERIPROSTHETIC FRACTURE;  Surgeon: Corky Mull, MD;  Location: [facility] ORS;  Service: Orthopedics;  Laterality: Right;  ? TONSILLECTOMY    ? TOTAL KNEE ARTHROPLASTY Bilateral   ? TOTAL SHOULDER ARTHROPLASTY Left 08/01/2016  ? Procedure: TOTAL SHOULDER ARTHROPLASTY;  Surgeon: Corky Mull, MD;  Location: [facility] ORS;  Service: Orthopedics;  Laterality: Left;  ? TOTAL SHOULDER ARTHROPLASTY Right 09/30/2019  ? Procedure: TOTAL SHOULDER ARTHROPLASTY;  Surgeon: Corky Mull, MD;  Location: [facility] ORS;  Service: Orthopedics;  Laterality: Right;  ? ? ?Family History  ?Problem Relation Age  of Onset  ? Other Mother   ?     ?lupus   ? Cancer Mother   ? High Cholesterol Mother   ? CAD Father   ?     CABG  ? High Cholesterol Father   ? Arthritis Father   ? Cancer Sister   ? Heart murmur Sister   ? Heart murmur Brother   ? High blood pressure Other   ?     "for everybody"  ? High Cholesterol Other   ?     "for everybody"  ? ? ?Social History  ? ?Socioeconomic History  ? Marital status: Single  ?  Spouse name: Not on file  ? Number of children: 3  ? Years of education: Not on file  ? Highest education level: Some college, no degree  ?Occupational History  ? Not on file  ?Tobacco Use  ? Smoking status: Every Day  ?  Packs/day: 0.50  ?  Types: Cigarettes  ?  Last attempt to quit: 08/04/2016  ?  Years since quitting: 5.0  ? Smokeless tobacco: Never  ?Vaping Use  ? Vaping Use: Never used  ?Substance and Sexual Activity  ? Alcohol use: No  ? Drug use: No  ? Sexual activity: Not Currently  ?  Birth control/protection: Surgical  ?Other Topics Concern  ? Not on file  ?Social History Narrative  ? Lives at home alone in an apt  ? Right handed  ? Disabled since 1998  ? Caffeine: about 30 oz daily  ? ?Social Determinants of Health  ? ?Financial Resource Strain: Not on file  ?Food Insecurity: Not on file  ?Transportation Needs: Not on file  ?Physical Activity: Not on file  ?Stress: Not on file  ?Social Connections: Not on file  ?Intimate Partner Violence: Not on file  ? ? ?Outpatient Medications Prior to Visit  ?Medication Sig Dispense Refill  ? acetaminophen (TYLENOL) 500 MG tablet Take 500 mg by mouth every 6 (six) hours as needed.    ? aspirin EC 81 MG EC tablet Take 1 tablet (81 mg total) by mouth daily. Swallow whole. 30 tablet 11  ? Carboxymethylcellulose Sodium (LUBRICANT EYE DROPS OP) Place 1 drop into both eyes daily as needed (Dry eye). Systane    ? citalopram (CELEXA) 40 MG tablet Take 1.5 tablets (60 mg total) by mouth daily. 90 tablet 0  ? diphenhydrAMINE (BENADRYL) 25 MG tablet Take 25 mg by mouth every 6  (six) hours as needed for itching.    ? fluticasone (FLONASE) 50 MCG/ACT nasal spray Place 2 sprays into both nostrils daily as needed for allergies or rhinitis. 16 g 12  ? ibuprofen (ADVIL) 800 MG tablet Take 800 mg by mouth every 8 (eight) hours as needed.    ? methocarbamol (ROBAXIN) 500 MG  tablet Take 500 mg by mouth 4 (four) times daily.    ? omeprazole (PRILOSEC) 40 MG capsule Take 40 mg by mouth daily before breakfast. Take 30 minutes before breakfast.    ? amLODipine (NORVASC) 5 MG tablet Take 1 tablet by mouth daily.    ? atorvastatin (LIPITOR) 40 MG tablet Take 40 mg by mouth daily.    ? gabapentin (NEURONTIN) 300 MG capsule Take 1 capsule (300 mg total) by mouth 4 (four) times daily. 90 capsule 3  ? lamoTRIgine (LAMICTAL) 200 MG tablet Take 1 tablet (200 mg total) by mouth daily. 90 tablet 0  ? Oxycodone HCl 10 MG TABS Take by mouth. (Patient not taking: Reported on 08/20/2021)    ? vitamin B-12 (CYANOCOBALAMIN) 500 MCG tablet Take 1 tablet (500 mcg total) by mouth daily. (Patient not taking: Reported on 08/20/2021) 30 tablet 0  ? ?No facility-administered medications prior to visit.  ? ? ?Allergies  ?Allergen Reactions  ? Hydroxyzine Hives and Rash  ? Dilaudid [Hydromorphone] Itching  ? Hydrocodone Hives and Rash  ?  "terrible scratching"  ?Make take with benadryl  ? Ketorolac Rash  ?   May take with benadryl  ? Percocet [Oxycodone-Acetaminophen] Itching and Rash  ?  May take with benadryl    ? Toradol [Ketorolac Tromethamine] Rash  ?  May take with benadryl  ? ? ?ROS ?Review of Systems  ?Constitutional:  Negative for chills and fatigue.  ?Respiratory:  Negative for cough and shortness of breath.   ?Cardiovascular:  Negative for chest pain and leg swelling.  ?Gastrointestinal:  Negative for diarrhea and nausea.  ?Genitourinary:  Negative for decreased urine volume, difficulty urinating, dysuria, genital sores, urgency, vaginal bleeding and vaginal pain.  ?     Sensation of having orgasm while urinating   ?Persistent has been for a few months  ?Musculoskeletal:  Positive for arthralgias (phanton pains are improving not as persistent, no longer taking robaxin).  ?Skin:  Positive for rash (abscess on pelvic area

## 2021-08-20 NOTE — Assessment & Plan Note (Signed)
Handicap placard filled out and given to pt for permanent ? ?

## 2021-08-23 LAB — WOUND CULTURE
MICRO NUMBER:: 13272582
SPECIMEN QUALITY:: ADEQUATE

## 2021-09-03 ENCOUNTER — Encounter: Payer: Self-pay | Admitting: Family

## 2021-09-03 ENCOUNTER — Ambulatory Visit (INDEPENDENT_AMBULATORY_CARE_PROVIDER_SITE_OTHER): Payer: Medicare Other | Admitting: Family

## 2021-09-03 VITALS — BP 142/80 | HR 42 | Temp 97.7°F | Resp 16

## 2021-09-03 DIAGNOSIS — M810 Age-related osteoporosis without current pathological fracture: Secondary | ICD-10-CM

## 2021-09-03 DIAGNOSIS — R001 Bradycardia, unspecified: Secondary | ICD-10-CM

## 2021-09-03 DIAGNOSIS — H4010X0 Unspecified open-angle glaucoma, stage unspecified: Secondary | ICD-10-CM

## 2021-09-03 DIAGNOSIS — F319 Bipolar disorder, unspecified: Secondary | ICD-10-CM

## 2021-09-03 DIAGNOSIS — Z8541 Personal history of malignant neoplasm of cervix uteri: Secondary | ICD-10-CM

## 2021-09-03 DIAGNOSIS — Z8542 Personal history of malignant neoplasm of other parts of uterus: Secondary | ICD-10-CM | POA: Insufficient documentation

## 2021-09-03 NOTE — Patient Instructions (Signed)
Recommend daily vitamin D 2000 IU once daily.  ?Recommend daily vitamin B12 1000 mcg once daily.  ? ?Due to recent changes in healthcare laws, you may see results of your imaging and/or laboratory studies on MyChart before I have had a chance to review them.  I understand that in some cases there may be results that are confusing or concerning to you. Please understand that not all results are received at the same time and often I may need to interpret multiple results in order to provide you with the best plan of care or course of treatment. Therefore, I ask that you please give me 2 business days to thoroughly review all your results before contacting my office for clarification. Should we see a critical lab result, you will be contacted sooner.  ? ?It was a pleasure seeing you today! Please do not hesitate to reach out with any questions and or concerns. ? ?Regards,  ? ?Vasiliki Smaldone ?FNP-C ? ?

## 2021-09-03 NOTE — Progress Notes (Signed)
? ?Established Patient Office Visit ? ?Subjective:  ?Patient ID: Sydney Evans, female    DOB: 29-Mar-1951  Age: 71 y.o. MRN: [id number] ? ?CC:  ?Chief Complaint  ?Patient presents with  ? Establish Care  ? ? ?HPI ?Sydney Evans is here today for follow up.  ?Pt is without acute concerns. ? ?Bradycardia: today 42 bpm , no dizziness or lightheaded sensation. Did have physical therapy this am and was 41 bpm. No cp sob or feeling overly tired. 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  ? ?Mammogram: pt declines states wants to get her physical therapy under control and better with prosthetic prior to getting this done. Mammogram last 12/21/20.  ? ?Bone density: overdue, never done per her history  ? ?Has referral for eye doctor, they have to call and make appt ? ?Bipolar: has appt to be set up with psychiatry for ongoing management.  ?Colonoscopy: not due until next year.  ? ?Past Medical History:  ?Diagnosis Date  ? 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 Anemia   ? Anxiety   ? Arthritis   ? Barrett's esophagus   ? Bipolar disorder ([place])   ? Blocked artery   ? 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  ? Blood clot in vein   ? Cancer [facility])   ? 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  ? Carotid arterial disease ([place])   ? Contracture of joint of upper arm   ? Critical limb ischemia of right lower extremity ([place]) 06/21/2021  ? Depression   ? Dislocation of right shoulder joint 10/06/2019  ? Disorder of bone and articular cartilage 09/29/2014  ? Elevated lipids   ? GERD (gastroesophageal reflux disease)   ? Hypertension   ? Migraine   ? Osteoporosis   ? Poor balance   ? Rotator cuff tendinitis, left 05/20/2016  ? Sinus congestion   ? 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 TIA (transient ischemic attack)   ? Traumatic complete tear of right rotator cuff 10/08/2019  ? Vertigo    ? ? ?Past Surgical History:  ?Procedure Laterality Date  ? ABDOMINAL HYSTERECTOMY    ? pt states might still have ovaries  ? BACK SURGERY    ? bka right    ? BREAST BIOPSY Left   ? CARPAL TUNNEL RELEASE Bilateral   ? CATARACT EXTRACTION W/ INTRAOCULAR LENS  IMPLANT, BILATERAL Bilateral   ? CHOLECYSTECTOMY    ? COLONOSCOPY WITH PROPOFOL N/A 04/09/2018  ? Procedure: COLONOSCOPY WITH PROPOFOL;  Surgeon: Jonathon Bellows, MD;  Location: [facility] ENDOSCOPY;  Service: Gastroenterology;  Laterality: N/A;  ? crystal cyst removed on left foot    ? ESOPHAGOGASTRODUODENOSCOPY (EGD) WITH PROPOFOL N/A 04/09/2018  ? Procedure: ESOPHAGOGASTRODUODENOSCOPY (EGD) WITH PROPOFOL;  Surgeon: Jonathon Bellows, MD;  Location: [facility] ENDOSCOPY;  Service: Gastroenterology;  Laterality: N/A;  ? ESOPHAGOGASTRODUODENOSCOPY (EGD) WITH PROPOFOL N/A 12/14/2019  ? Procedure: ESOPHAGOGASTRODUODENOSCOPY (EGD) WITH PROPOFOL;  Surgeon: Lesly Rubenstein, MD;  Location: [facility] ENDOSCOPY;  Service: Endoscopy;  Laterality: N/A;  ? EYE SURGERY    ? femoral fx    ? FOOT SURGERY    ? GIVENS CAPSULE STUDY N/A 06/16/2018  ? Procedure: GIVENS CAPSULE STUDY;  Surgeon: Jonathon Bellows, MD;  Location: [facility] ENDOSCOPY;  Service: Gastroenterology;  Laterality: N/A;  ? JOINT REPLACEMENT    ? KNEE SURGERY    ?  LOWER EXTREMITY ANGIOGRAPHY Right 06/21/2021  ? Procedure: Lower Extremity Angiography;  Surgeon: Katha Cabal, MD;  Location: [place] CV LAB;  Service: Cardiovascular;  Laterality: Right;  ? ORIF PERIPROSTHETIC FRACTURE Right 10/07/2019  ? Procedure: OPEN REDUCTION INTERNAL FIXATION (ORIF) PERIPROSTHETIC FRACTURE;  Surgeon: Corky Mull, MD;  Location: [facility] ORS;  Service: Orthopedics;  Laterality: Right;  ? TONSILLECTOMY    ? TOTAL KNEE ARTHROPLASTY Bilateral   ? TOTAL SHOULDER ARTHROPLASTY Left 08/01/2016  ? Procedure: TOTAL SHOULDER ARTHROPLASTY;  Surgeon: Corky Mull, MD;  Location: [facility] ORS;  Service: Orthopedics;  Laterality: Left;  ? TOTAL SHOULDER ARTHROPLASTY  Right 09/30/2019  ? Procedure: TOTAL SHOULDER ARTHROPLASTY;  Surgeon: Corky Mull, MD;  Location: [facility] ORS;  Service: Orthopedics;  Laterality: Right;  ? ? ?Family History  ?Problem Relation Age of Onset  ? Other Mother   ?     ?lupus   ? Cancer Mother   ? High Cholesterol Mother   ? CAD Father   ?     CABG  ? High Cholesterol Father   ? Arthritis Father   ? Heart murmur Sister   ? Bradycardia Sister   ? Heart murmur Brother   ? High blood pressure Other   ?     "for everybody"  ? High Cholesterol Other   ?     "for everybody"  ? ? ?Social History  ? ?Socioeconomic History  ? Marital status: Single  ?  Spouse name: Not on file  ? Number of children: 3  ? Years of education: Not on file  ? Highest education level: Some college, no degree  ?Occupational History  ? Not on file  ?Tobacco Use  ? Smoking status: Every Day  ?  Packs/day: 0.50  ?  Types: Cigarettes  ?  Last attempt to quit: 08/04/2016  ?  Years since quitting: 5.0  ? Smokeless tobacco: Never  ?Vaping Use  ? Vaping Use: Never used  ?Substance and Sexual Activity  ? Alcohol use: No  ? Drug use: No  ? Sexual activity: Not Currently  ?  Birth control/protection: Surgical  ?Other Topics Concern  ? Not on file  ?Social History Narrative  ? Lives at home alone in an apt  ? Right handed  ? Disabled since 1998  ? Caffeine: about 30 oz daily  ? ?Social Determinants of Health  ? ?Financial Resource Strain: Not on file  ?Food Insecurity: Not on file  ?Transportation Needs: Not on file  ?Physical Activity: Not on file  ?Stress: Not on file  ?Social Connections: Not on file  ?Intimate Partner Violence: Not on file  ? ? ?Outpatient Medications Prior to Visit  ?Medication Sig Dispense Refill  ? acetaminophen (TYLENOL) 500 MG tablet Take 500 mg by mouth every 6 (six) hours as needed.    ? amLODipine (NORVASC) 5 MG tablet Take 1 tablet (5 mg total) by mouth daily. 30 tablet 11  ? aspirin EC 81 MG EC tablet Take 1 tablet (81 mg total) by mouth daily. Swallow whole. 30 tablet  11  ? atorvastatin (LIPITOR) 40 MG tablet Take 1 tablet (40 mg total) by mouth daily. 90 tablet 1  ? Carboxymethylcellulose Sodium (LUBRICANT EYE DROPS OP) Place 1 drop into both eyes daily as needed (Dry eye). Systane    ? citalopram (CELEXA) 40 MG tablet Take 1.5 tablets (60 mg total) by mouth daily. 90 tablet 0  ? clotrimazole-betamethasone (LOTRISONE) cream Apply 1 application. topically daily. 30 g  0  ? diphenhydrAMINE (BENADRYL) 25 MG tablet Take 25 mg by mouth every 6 (six) hours as needed for itching.    ? fluticasone (FLONASE) 50 MCG/ACT nasal spray Place 2 sprays into both nostrils daily as needed for allergies or rhinitis. 16 g 12  ? gabapentin (NEURONTIN) 300 MG capsule Take 1 capsule (300 mg total) by mouth 4 (four) times daily. 360 capsule 1  ? ibuprofen (ADVIL) 800 MG tablet Take 800 mg by mouth every 8 (eight) hours as needed.    ? lamoTRIgine (LAMICTAL) 200 MG tablet Take 1 tablet (200 mg total) by mouth daily. 90 tablet 0  ? methocarbamol (ROBAXIN) 500 MG tablet Take 500 mg by mouth 4 (four) times daily.    ? omeprazole (PRILOSEC) 40 MG capsule Take 40 mg by mouth daily before breakfast. Take 30 minutes before breakfast.    ? ?No facility-administered medications prior to visit.  ? ? ?Allergies  ?Allergen Reactions  ? Hydroxyzine Hives and Rash  ? Dilaudid [Hydromorphone] Itching  ? Hydrocodone Hives and Rash  ?  "terrible scratching"  ?Make take with benadryl  ? Ketorolac Rash  ?   May take with benadryl  ? Percocet [Oxycodone-Acetaminophen] Itching and Rash  ?  May take with benadryl    ? Toradol [Ketorolac Tromethamine] Rash  ?  May take with benadryl  ? ? ?ROS ?Review of Systems  ?Constitutional:  Negative for chills, fatigue, fever and unexpected weight change.  ?Eyes:  Negative for visual disturbance.  ?Respiratory:  Negative for shortness of breath.   ?Cardiovascular:  Negative for chest pain.  ?Genitourinary:  Negative for difficulty urinating.  ?Skin:  Negative for rash.  ?Neurological:   Negative for dizziness and headaches.  ? ? ? ?  ?Objective:  ?  ?Physical Exam ?Constitutional:   ?   General: She is not in acute distress. ?   Appearance: Normal appearance. She is normal weight. She is n

## 2021-09-04 DIAGNOSIS — R001 Bradycardia, unspecified: Secondary | ICD-10-CM | POA: Insufficient documentation

## 2021-09-04 NOTE — Assessment & Plan Note (Signed)
Has appt coming up for opthamology, maintain as scheduled. ?

## 2021-09-04 NOTE — Assessment & Plan Note (Signed)
Reviewed

## 2021-09-04 NOTE — Assessment & Plan Note (Signed)
Pt declines dexa scan for now. Will do once she is better with her prosthesis and mobility. ?

## 2021-09-04 NOTE — Assessment & Plan Note (Signed)
Again reminded that I will be unable to fill medications for bipolar, pt to consult with psychiatry for ongoing refills. ?

## 2021-09-04 NOTE — Assessment & Plan Note (Signed)
Reviewed. ?Spoke with pt about overdue mammogram, again pt declines. Advised with h/o cancer would be better to order, pt again declines for now.  ?

## 2021-09-04 NOTE — Assessment & Plan Note (Signed)
Stable. Reviewed note from cardiologist in the past, seems to be stable from then. If any dizziness, increased fatigue, chest pain, let me know immediately and or seek urgent care depending on degree. ?

## 2021-09-13 ENCOUNTER — Telehealth: Payer: Self-pay

## 2021-09-13 NOTE — Telephone Encounter (Signed)
Left message to return call to our office. Pt need an appt to see Dugal. ?

## 2021-09-19 ENCOUNTER — Telehealth: Payer: Self-pay

## 2021-09-19 NOTE — Telephone Encounter (Signed)
Called and spoke w/ [name] the [facility]  gave verbal for patient OT. ?

## 2021-09-19 NOTE — Telephone Encounter (Signed)
Connie with [facility] asking for verbal orders to Extended OT 1 x week for 6 weeks. Can leave orders on VM ?

## 2021-09-20 ENCOUNTER — Ambulatory Visit: Payer: Medicare Other | Admitting: Family

## 2021-09-24 ENCOUNTER — Telehealth: Payer: Self-pay

## 2021-09-24 NOTE — Telephone Encounter (Signed)
Called pt and left a vm. Pt needs an office visit with Dugal [place] over paperwork.

## 2021-10-05 DIAGNOSIS — D509 Iron deficiency anemia, unspecified: Secondary | ICD-10-CM

## 2021-10-05 DIAGNOSIS — I6521 Occlusion and stenosis of right carotid artery: Secondary | ICD-10-CM | POA: Diagnosis not present

## 2021-10-05 DIAGNOSIS — Z89511 Acquired absence of right leg below knee: Secondary | ICD-10-CM

## 2021-10-05 DIAGNOSIS — F319 Bipolar disorder, unspecified: Secondary | ICD-10-CM

## 2021-10-05 DIAGNOSIS — Z87891 Personal history of nicotine dependence: Secondary | ICD-10-CM

## 2021-10-05 DIAGNOSIS — Z7982 Long term (current) use of aspirin: Secondary | ICD-10-CM

## 2021-10-05 DIAGNOSIS — I739 Peripheral vascular disease, unspecified: Secondary | ICD-10-CM

## 2021-10-05 DIAGNOSIS — K219 Gastro-esophageal reflux disease without esophagitis: Secondary | ICD-10-CM

## 2021-10-05 DIAGNOSIS — Z9181 History of falling: Secondary | ICD-10-CM

## 2021-10-05 DIAGNOSIS — Z96653 Presence of artificial knee joint, bilateral: Secondary | ICD-10-CM

## 2021-10-05 DIAGNOSIS — M810 Age-related osteoporosis without current pathological fracture: Secondary | ICD-10-CM

## 2021-10-05 DIAGNOSIS — I69354 Hemiplegia and hemiparesis following cerebral infarction affecting left non-dominant side: Secondary | ICD-10-CM

## 2021-10-05 DIAGNOSIS — Z86718 Personal history of other venous thrombosis and embolism: Secondary | ICD-10-CM

## 2021-10-05 DIAGNOSIS — Z87440 Personal history of urinary (tract) infections: Secondary | ICD-10-CM

## 2021-10-05 DIAGNOSIS — N390 Urinary tract infection, site not specified: Secondary | ICD-10-CM | POA: Diagnosis not present

## 2021-10-05 DIAGNOSIS — Z4781 Encounter for orthopedic aftercare following surgical amputation: Secondary | ICD-10-CM | POA: Diagnosis not present

## 2021-10-05 DIAGNOSIS — Z96643 Presence of artificial hip joint, bilateral: Secondary | ICD-10-CM

## 2021-10-05 DIAGNOSIS — Z7902 Long term (current) use of antithrombotics/antiplatelets: Secondary | ICD-10-CM

## 2021-10-05 DIAGNOSIS — I10 Essential (primary) hypertension: Secondary | ICD-10-CM | POA: Diagnosis not present

## 2021-10-05 DIAGNOSIS — H9192 Unspecified hearing loss, left ear: Secondary | ICD-10-CM

## 2021-10-15 ENCOUNTER — Telehealth: Payer: Self-pay

## 2021-10-15 NOTE — Telephone Encounter (Signed)
[facility] called to report fall. Pt had fall on 10/11/21. She had some busing on face. Feels like she may had had concussion but did not go to ED. Left knee has abrasion. She did have loss of consciousness for about 3 minutes. Patient was not alone. Friend that was there thinks that she was trying to stand up from wheelchair. Patient declined appointment in office. She does have bleeding from knee abrasion. She did have some bleeding from head for short time after fall. She is on ASA '81mg'$  daily.

## 2021-10-15 NOTE — Telephone Encounter (Signed)
Appointment made for 6/23

## 2021-10-26 ENCOUNTER — Ambulatory Visit: Payer: Medicare Other | Admitting: Family

## 2021-11-07 ENCOUNTER — Emergency Department: Payer: Medicare Other

## 2021-11-07 ENCOUNTER — Encounter: Payer: Self-pay | Admitting: Emergency Medicine

## 2021-11-07 ENCOUNTER — Emergency Department
Admission: EM | Admit: 2021-11-07 | Discharge: 2021-11-07 | Discharge status: Against Medical Advice | Payer: Medicare Other | Attending: Emergency Medicine | Admitting: Emergency Medicine

## 2021-11-07 DIAGNOSIS — M79622 Pain in left upper arm: Secondary | ICD-10-CM | POA: Insufficient documentation

## 2021-11-07 DIAGNOSIS — R519 Headache, unspecified: Secondary | ICD-10-CM | POA: Diagnosis not present

## 2021-11-07 DIAGNOSIS — M25512 Pain in left shoulder: Secondary | ICD-10-CM | POA: Insufficient documentation

## 2021-11-07 DIAGNOSIS — M79602 Pain in left arm: Secondary | ICD-10-CM

## 2021-11-07 DIAGNOSIS — M5412 Radiculopathy, cervical region: Secondary | ICD-10-CM

## 2021-11-07 DIAGNOSIS — Z5321 Procedure and treatment not carried out due to patient leaving prior to being seen by health care provider: Secondary | ICD-10-CM | POA: Insufficient documentation

## 2021-11-07 LAB — CBC
HCT: 35 % — ABNORMAL LOW (ref 36.0–46.0)
Hemoglobin: 10.7 g/dL — ABNORMAL LOW (ref 12.0–15.0)
MCH: 24.7 pg — ABNORMAL LOW (ref 26.0–34.0)
MCHC: 30.6 g/dL (ref 30.0–36.0)
MCV: 80.8 fL (ref 80.0–100.0)
Platelets: 319 10*3/uL (ref 150–400)
RBC: 4.33 MIL/uL (ref 3.87–5.11)
RDW: 16.2 % — ABNORMAL HIGH (ref 11.5–15.5)
WBC: 5.8 10*3/uL (ref 4.0–10.5)
nRBC: 0 % (ref 0.0–0.2)

## 2021-11-07 LAB — BASIC METABOLIC PANEL
Anion gap: 10 (ref 5–15)
BUN: 10 mg/dL (ref 8–23)
CO2: 22 mmol/L (ref 22–32)
Calcium: 8.5 mg/dL — ABNORMAL LOW (ref 8.9–10.3)
Chloride: 102 mmol/L (ref 98–111)
Creatinine, Ser: 0.83 mg/dL (ref 0.44–1.00)
GFR, Estimated: >60 mL/min (ref >60)
Glucose, Bld: 91 mg/dL (ref 70–99)
Potassium: 3.6 mmol/L (ref 3.5–5.1)
Sodium: 134 mmol/L — ABNORMAL LOW (ref 135–145)

## 2021-11-07 LAB — TROPONIN I (HIGH SENSITIVITY): Troponin I (High Sensitivity): 8 ng/L (ref <18)

## 2021-11-07 MED ORDER — LIDOCAINE 5 % EX PTCH: 1.0000 | MEDICATED_PATCH | Freq: Two times a day (BID) | CUTANEOUS | 0 refills | Status: DC | PRN

## 2021-11-07 MED ORDER — LIDOCAINE 5 % EX PTCH
1.0000 | MEDICATED_PATCH | Freq: Once | CUTANEOUS | Status: DC
Start: 2021-11-07 — End: 2021-11-08
  Administered 2021-11-07: 1 via TRANSDERMAL
  Filled 2021-11-07: qty 1

## 2021-11-07 MED ORDER — CYCLOBENZAPRINE HCL 5 MG PO TABS: 5.0000 mg | ORAL_TABLET | Freq: Three times a day (TID) | ORAL | 0 refills | Status: DC | PRN

## 2021-11-07 MED ORDER — CYCLOBENZAPRINE HCL 10 MG PO TABS
10.0000 mg | ORAL_TABLET | Freq: Once | ORAL | Status: AC
Start: 2021-11-07 — End: 2021-11-07
  Administered 2021-11-07: 10 mg via ORAL
  Filled 2021-11-07: qty 1

## 2021-11-07 NOTE — ED Triage Notes (Signed)
EMS brings pt in from home for c/o left shoulder/arm pain x 2 days, worsening now radiating into neck with HA

## 2021-11-07 NOTE — ED Provider Triage Note (Signed)
Emergency Medicine Provider Triage Evaluation Note  Hien Cunliffe, a 71 y.o. female  was evaluated in triage.  Pt complains of LUE pain and disability.  Status post a left and right shoulder arthroplasty.  She presents today with some hand cramping and decreased range of motion.  She denies any recent injury, trauma, fall.  Review of Systems  Positive: RUE pain  Negative: FCS  Physical Exam  BP 136/85   Pulse (!) 56   Temp 98.6 F (37 C) (Oral)   Resp 20   Ht '5\' 2"'$  (1.575 m)   Wt 68 kg   SpO2 99%   BMI 27.42 kg/m  Gen:   Awake, no distress  NAD Resp:  Normal effort CTA MSK:   Moves extremities without difficulty Normal AROM NEURO: CN II-XI grossly intact.   Medical Decision Making  Medically screening exam initiated at 9:49 PM.  Appropriate orders placed.  Deandrea Vanpelt was informed that the remainder of the evaluation will be completed by another provider, this initial triage assessment does not replace that evaluation, and the importance of remaining in the ED until their evaluation is complete.  Patient with significant orthopedic surgeries presents to the ED with complaints of LUE pain and hand cramping.    Melvenia Needles, PA-C 11/07/21 [id number]

## 2021-11-07 NOTE — ED Triage Notes (Signed)
Pt presents via EMS from home with complaints of left sided shoulder pain with radiation to her neck for the las two days. Pt also endorses a headache on the left side of her head that started today. She notes taking ASA daily and she last took Tylenol 7 hours ago. Denies CP, SOB, or falls.

## 2021-11-07 NOTE — ED Provider Notes (Signed)
[facility] Emergency Department Provider Note     None    (approximate)   History   Arm Pain   HPI  Sydney Evans is a 71 y.o. female presents to the ED via EMS from home, she reports some left-sided shoulder pain with radiation into her neck.  She reports symptoms last 2 days.  Patient also reports a headache primarily on the left side that started today.  She does take an aspirin daily and she took a Tylenol several hours earlier.  She denies any chest pain, shortness of breath, syncope, weakness, or falls.     Physical Exam   Triage Vital Signs: ED Triage Vitals [11/07/21 2031]  Enc Vitals Group     BP 136/85     Pulse Rate (!) 56     Resp 20     Temp 98.6 F (37 C)     Temp Source Oral     SpO2 99 %     Weight 149 lb 14.6 oz (68 kg)     Height '5\' 2"'$  (1.575 m)     Head Circumference      Peak Flow      Pain Score 10     Pain Loc      Pain Edu?      Excl. in [place]?     Most recent vital signs: Vitals:   11/07/21 2031  BP: 136/85  Pulse: (!) 56  Resp: 20  Temp: 98.6 F (37 C)  SpO2: 99%    General Awake, no distress.  HEENT NCAT. PERRL. EOMI. No rhinorrhea. Mucous membranes are moist.  CV:  Good peripheral perfusion.  RESP:  Normal effort.  ABD:  No distention.  MSK:  Neck is supple with normal range of motion.  Left shoulder without obvious deformity, dislocation, or sulcus sign.  Patient with full active range of motion on exam.  No indication internal derangement on exam.  Normal composite fist distally. NEURO: Cranial nerves II through XII grossly intact.  Normal UE DTRs bilaterally.  ED Results / Procedures / Treatments   Labs (all labs ordered are listed, but only abnormal results are displayed) Labs Reviewed  BASIC METABOLIC PANEL - Abnormal; Notable for the following components:      Result Value   Sodium 134 (*)    Calcium 8.5 (*)    All other components within normal limits  CBC - Abnormal; Notable for the  following components:   Hemoglobin 10.7 (*)    HCT 35.0 (*)    MCH 24.7 (*)    RDW 16.2 (*)    All other components within normal limits  TROPONIN I (HIGH SENSITIVITY)     EKG  Vent. rate 45 BPM PR interval 140 ms QRS duration 70 ms QT/QTcB 498/430 ms P-R-T axes 83 68 96 No STEMI  RADIOLOGY  I personally viewed and evaluated these images as part of my medical decision making, as well as reviewing the written report by the radiologist.  ED Provider Interpretation: no acute findings}  DG Shoulder Left  Result Date: 11/07/2021 CLINICAL DATA:  Left shoulder pain. EXAM: LEFT SHOULDER - 2+ VIEW COMPARISON:  None Available. FINDINGS: Left proximal humeral arthroplasty. No periprosthetic lucency or fracture. Normal alignment. Acromioclavicular joint is congruent. No fracture, erosion, bony destruction or focal bone abnormality. IMPRESSION: Left proximal humeral arthroplasty without complication. No radiographic explanation for pain. Electronically Signed   By: Keith Rake M.D.   On: 11/07/2021 21:20   DG  Chest 1 View  Result Date: 11/07/2021 CLINICAL DATA:  Chest pain radiating into the left shoulder, initial encounter EXAM: CHEST  1 VIEW COMPARISON:  05/01/2021 FINDINGS: Cardiac shadow is stable. Lungs are well aerated bilaterally. No focal infiltrate or sizable effusion seen. Multilevel vertebral augmentation is noted. Bilateral shoulder replacements are seen. IMPRESSION: No acute abnormality noted. Electronically Signed   By: Inez Catalina M.D.   On: 11/07/2021 21:19   CT HEAD WO CONTRAST (5MM)  Result Date: 11/07/2021 CLINICAL DATA:  71 year old female presents for evaluation of neck pain, headache with shoulder and arm pain. EXAM: CT HEAD WITHOUT CONTRAST CT CERVICAL SPINE WITHOUT CONTRAST TECHNIQUE: Multidetector CT imaging of the head and cervical spine was performed following the standard protocol without intravenous contrast. Multiplanar CT image reconstructions of the cervical  spine were also generated. RADIATION DOSE REDUCTION: This exam was performed according to the departmental dose-optimization program which includes automated exposure control, adjustment of the mA and/or kV according to patient size and/or use of iterative reconstruction technique. COMPARISON:  August 01, 2018. FINDINGS: CT HEAD FINDINGS Brain: No evidence of acute infarction, hemorrhage, hydrocephalus, extra-axial collection or mass lesion/mass effect. Signs of prior infarct with encephalomalacia in the RIGHT posterior frontal/parietal region. Also in the RIGHT occipital lobe, these findings are unchanged. Signs of generalized atrophy also unchanged Vascular: No hyperdense vessel or unexpected calcification. Skull: Normal. Negative for fracture or focal lesion. Sinuses/Orbits: Visualized paranasal sinuses and orbits are unremarkable to the extent evaluated Other: None CT CERVICAL SPINE FINDINGS Alignment: Straightening of normal cervical lordotic curvature likely related to patient position or spasm. Minimal anterolisthesis of C3 on C4 in the setting of degenerative changes likely related to degenerative process and similar to previous imaging at approximately 2 mm. Skull base and vertebrae: No acute fracture. No primary bone lesion or focal pathologic process. Soft tissues and spinal canal: No prevertebral fluid or swelling. No visible canal hematoma. Disc levels: Multilevel degenerative changes in the spine with facet arthropathy greatest in the mid and upper cervical spine and disc space narrowing greatest in the mid cervical spine. Facet arthropathy is greatest on the LEFT associated with some neural foraminal narrowing at the C4-5 level and C5-6 as well as C6-7 levels in particular on the LEFT. Upper chest: Negative. Other: Bilateral shoulder arthroplasties limit assessment mildly due to streak artifact. IMPRESSION: 1. No acute intracranial abnormality. 2. Signs of prior infarct with encephalomalacia in the  RIGHT posterior frontal/parietal region and RIGHT occipital lobe, unchanged. 3. No evidence for acute fracture of the cervical spine. 4. Multilevel degenerative changes with facet arthropathy that is greatest on the LEFT at multiple levels and associated with mild anterolisthesis of C4 on C5 slightly worse than on previous imaging. Electronically Signed   By: Zetta Bills M.D.   On: 11/07/2021 21:16   CT Cervical Spine Wo Contrast  Result Date: 11/07/2021 CLINICAL DATA:  71 year old female presents for evaluation of neck pain, headache with shoulder and arm pain. EXAM: CT HEAD WITHOUT CONTRAST CT CERVICAL SPINE WITHOUT CONTRAST TECHNIQUE: Multidetector CT imaging of the head and cervical spine was performed following the standard protocol without intravenous contrast. Multiplanar CT image reconstructions of the cervical spine were also generated. RADIATION DOSE REDUCTION: This exam was performed according to the departmental dose-optimization program which includes automated exposure control, adjustment of the mA and/or kV according to patient size and/or use of iterative reconstruction technique. COMPARISON:  August 01, 2018. FINDINGS: CT HEAD FINDINGS Brain: No evidence of acute infarction, hemorrhage, hydrocephalus,  extra-axial collection or mass lesion/mass effect. Signs of prior infarct with encephalomalacia in the RIGHT posterior frontal/parietal region. Also in the RIGHT occipital lobe, these findings are unchanged. Signs of generalized atrophy also unchanged Vascular: No hyperdense vessel or unexpected calcification. Skull: Normal. Negative for fracture or focal lesion. Sinuses/Orbits: Visualized paranasal sinuses and orbits are unremarkable to the extent evaluated Other: None CT CERVICAL SPINE FINDINGS Alignment: Straightening of normal cervical lordotic curvature likely related to patient position or spasm. Minimal anterolisthesis of C3 on C4 in the setting of degenerative changes likely related to  degenerative process and similar to previous imaging at approximately 2 mm. Skull base and vertebrae: No acute fracture. No primary bone lesion or focal pathologic process. Soft tissues and spinal canal: No prevertebral fluid or swelling. No visible canal hematoma. Disc levels: Multilevel degenerative changes in the spine with facet arthropathy greatest in the mid and upper cervical spine and disc space narrowing greatest in the mid cervical spine. Facet arthropathy is greatest on the LEFT associated with some neural foraminal narrowing at the C4-5 level and C5-6 as well as C6-7 levels in particular on the LEFT. Upper chest: Negative. Other: Bilateral shoulder arthroplasties limit assessment mildly due to streak artifact. IMPRESSION: 1. No acute intracranial abnormality. 2. Signs of prior infarct with encephalomalacia in the RIGHT posterior frontal/parietal region and RIGHT occipital lobe, unchanged. 3. No evidence for acute fracture of the cervical spine. 4. Multilevel degenerative changes with facet arthropathy that is greatest on the LEFT at multiple levels and associated with mild anterolisthesis of C4 on C5 slightly worse than on previous imaging. Electronically Signed   By: Zetta Bills M.D.   On: 11/07/2021 21:16     PROCEDURES:  Critical Care performed: No  Procedures   MEDICATIONS ORDERED IN ED: Medications  cyclobenzaprine (FLEXERIL) tablet 10 mg (has no administration in time range)     IMPRESSION / MDM / ASSESSMENT AND PLAN / ED COURSE  I reviewed the triage vital signs and the nursing notes.                              Differential diagnosis includes, but is not limited to, arthritis, tendinitis, cervical radiculopathy, subdural hemorrhage, acute headache syndrome, infection  Patient's presentation is most consistent with acute complicated illness / injury requiring diagnostic workup.  Patient to the ED for evaluation of motor Supple complaints including headache and left arm  and shoulder pain.  Patient exam is overall reassuring and benign at this time.  No signs of any acute neuromuscular deficit.  No radiologic evidence of any acute fracture or dislocation based on my review.  CT imaging of the head and neck are also reassuring without any acute intracranial or cervical findings.  No lab abnormalities at this time and troponin is negative without concern for ACS or STEMI.  Patient's diagnosis is consistent with cervical radiculopathy. Patient will be discharged home with prescriptions for cyclobenzaprine and Lidoderm patches. Patient is to follow up with her primary provider as needed or otherwise directed. Patient is given ED precautions to return to the ED for any worsening or new symptoms.   FINAL CLINICAL IMPRESSION(S) / ED DIAGNOSES   Final diagnoses:  Left arm pain  Cervical radiculopathy     Rx / DC Orders   ED Discharge Orders     None        Note:  This document was prepared using Dragon voice recognition software and  may include unintentional dictation errors.    Melvenia Needles, PA-C 11/10/21 2105    Hinda Kehr, MD 11/12/21 0221

## 2021-11-13 ENCOUNTER — Ambulatory Visit (INDEPENDENT_AMBULATORY_CARE_PROVIDER_SITE_OTHER): Payer: Medicare Other | Admitting: Family

## 2021-11-13 ENCOUNTER — Encounter: Payer: Self-pay | Admitting: Family

## 2021-11-13 VITALS — BP 160/65 | HR 69 | Temp 98.3°F | Resp 16

## 2021-11-13 DIAGNOSIS — M47812 Spondylosis without myelopathy or radiculopathy, cervical region: Secondary | ICD-10-CM

## 2021-11-13 DIAGNOSIS — M4312 Spondylolisthesis, cervical region: Secondary | ICD-10-CM

## 2021-11-13 MED ORDER — TRAMADOL HCL 50 MG PO TABS: 50.0000 mg | ORAL_TABLET | Freq: Three times a day (TID) | ORAL | 0 refills | Status: AC | PRN

## 2021-11-13 NOTE — Progress Notes (Signed)
Established Patient Office Visit  Subjective:  Patient ID: Sydney Evans, female    DOB: [birth year]  Age: 71 y.o. MRN: [id number]  CC:  Chief Complaint  Patient presents with   Hospitalization Follow-up    Left starts in ear and goes to the hand.     HPI Sydney Evans is here today for follow up.   Pt is with acute concerns.  Went to ER via EMS with left sided shoulder pain with radiation to her neck. She also had a headache on her left side.  Left shoulder xray- left proximal humeral arthroplasty without complication, No fracture.  Chest ray: no acute abn  Ct chest wo contrast: no acute findings , signs of prior infarct with encephalomalacia right posterior frontal parietal region. Generalized atrophy, unchanged.  CT cervical spine: minimal anterolisthesis of C3 to C4 degenerative changes likely related to degenerative process similar to prior imaging. No acute fracture. Multilevel degenerative change sin spine with facet arthropathy greatest in mid and upper cervical spine and disc space narrowing greatest in the mid cervical spine. Facet arthropathy greatest on LEFT associated with neural foraminal narrowing at C4-5 level and C5-6 and C5-6 and C6-7 on left.   Troponin negative without concern for ACS or STEMI  Dx with cervical radiculopathy given flexeril and lidoderm patches   Anemia stable, also improving slightly.  Very mildly low sodium.  Calcium on lower end.  B12 on lower end.   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 Does have prostetic at home   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   Anemia    Anxiety    Arthritis    Barrett's esophagus    Bipolar disorder (HCC)    Blocked artery    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   Blood clot in vein    Cancer ([place])    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   Carotid arterial disease ([place])    Contracture of joint of  upper arm    Critical limb ischemia of right lower extremity ([place]) 06/21/2021   Depression    Dislocation of right shoulder joint 10/06/2019   Disorder of bone and articular cartilage 09/29/2014   Elevated lipids    GERD (gastroesophageal reflux disease)    Hypertension    Migraine    Osteoporosis    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   Poor balance    Rotator cuff tendinitis, left 05/20/2016   Sinus congestion    Status post total shoulder arthroplasty, 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   Status post total shoulder replacement, 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   TIA (transient ischemic attack)    Traumatic complete tear of right rotator cuff 10/08/2019   Vertigo     Past Surgical History:  Procedure Laterality Date   ABDOMINAL HYSTERECTOMY     pt states might still have ovaries   BACK SURGERY     bka right     BREAST BIOPSY Left    CARPAL TUNNEL RELEASE Bilateral    CATARACT EXTRACTION W/ INTRAOCULAR LENS  IMPLANT, BILATERAL Bilateral    CHOLECYSTECTOMY     COLONOSCOPY WITH PROPOFOL N/A 04/09/2018   Procedure: COLONOSCOPY WITH PROPOFOL;  Surgeon: Jonathon Bellows, MD;  Location: [facility] ENDOSCOPY;  Service: Gastroenterology;  Laterality: N/A;   crystal cyst removed on left foot     ESOPHAGOGASTRODUODENOSCOPY (EGD) WITH  PROPOFOL N/A 04/09/2018   Procedure: ESOPHAGOGASTRODUODENOSCOPY (EGD) WITH PROPOFOL;  Surgeon: Jonathon Bellows, MD;  Location: [facility] ENDOSCOPY;  Service: Gastroenterology;  Laterality: N/A;   ESOPHAGOGASTRODUODENOSCOPY (EGD) WITH PROPOFOL N/A 12/14/2019   Procedure: ESOPHAGOGASTRODUODENOSCOPY (EGD) WITH PROPOFOL;  Surgeon: Lesly Rubenstein, MD;  Location: [facility] ENDOSCOPY;  Service: Endoscopy;  Laterality: N/A;   EYE SURGERY     femoral fx     FOOT SURGERY     GIVENS CAPSULE STUDY N/A 06/16/2018   Procedure: GIVENS CAPSULE STUDY;  Surgeon: Jonathon Bellows, MD;  Location: [facility] ENDOSCOPY;  Service: Gastroenterology;  Laterality: N/A;   JOINT REPLACEMENT     KNEE SURGERY     LOWER  EXTREMITY ANGIOGRAPHY Right 06/21/2021   Procedure: Lower Extremity Angiography;  Surgeon: Katha Cabal, MD;  Location: [facility];  Service: Cardiovascular;  Laterality: Right;   ORIF PERIPROSTHETIC FRACTURE Right 10/07/2019   Procedure: OPEN REDUCTION INTERNAL FIXATION (ORIF) PERIPROSTHETIC FRACTURE;  Surgeon: Corky Mull, MD;  Location: [facility] ORS;  Service: Orthopedics;  Laterality: Right;   TONSILLECTOMY     TOTAL KNEE ARTHROPLASTY Bilateral    TOTAL SHOULDER ARTHROPLASTY Left 08/01/2016   Procedure: TOTAL SHOULDER ARTHROPLASTY;  Surgeon: Corky Mull, MD;  Location: [facility] ORS;  Service: Orthopedics;  Laterality: Left;   TOTAL SHOULDER ARTHROPLASTY Right 09/30/2019   Procedure: TOTAL SHOULDER ARTHROPLASTY;  Surgeon: Corky Mull, MD;  Location: [facility] ORS;  Service: Orthopedics;  Laterality: Right;    Family History  Problem Relation Age of Onset   Other Mother        ?lupus    Cancer Mother    High Cholesterol Mother    CAD Father        CABG   High Cholesterol Father    Arthritis Father    Heart murmur Sister    Bradycardia Sister    Heart murmur Brother    High blood pressure Other        "for everybody"   High Cholesterol Other        "for everybody"    Social History   Socioeconomic History   Marital status: Single    Spouse name: Not on file   Number of children: 3   Years of education: Not on file   Highest education level: Some college, no degree  Occupational History   Not on file  Tobacco Use   Smoking status: Every Day    Packs/day: 0.50    Types: Cigarettes    Last attempt to quit: 08/04/2016    Years since quitting: 5.2   Smokeless tobacco: Never  Vaping Use   Vaping Use: Never used  Substance and Sexual Activity   Alcohol use: No   Drug use: No   Sexual activity: Not Currently    Birth control/protection: Surgical  Other Topics Concern   Not on file  Social History Narrative   Lives at home alone in an apt   Right handed    Disabled since 1998   Caffeine: about 30 oz daily   Social Determinants of Health   Financial Resource Strain: Low Risk  (02/15/2020)   Overall Financial Resource Strain (CARDIA)    Difficulty of Paying Living Expenses: Not hard at all  Food Insecurity: No Food Insecurity (02/15/2020)   Hunger Vital Sign    Worried About Running Out of Food in the Last Year: Never true    Ran Out of Food in the Last Year: Never true  Transportation Needs: No Transportation Needs (  02/15/2020)   PRAPARE - Hydrologist (Medical): No    Lack of Transportation (Non-Medical): No  Physical Activity: Inactive (02/15/2020)   Exercise Vital Sign    Days of Exercise per Week: 0 days    Minutes of Exercise per Session: 0 min  Stress: Stress Concern Present (02/15/2020)   [place]    Feeling of Stress : Very much  Social Connections: Unknown (02/15/2020)   Social Connection and Isolation Panel [NHANES]    Frequency of Communication with Friends and Family: More than three times a week    Frequency of Social Gatherings with Friends and Family: More than three times a week    Attends Religious Services: Not on file    Active Member of Clubs or Organizations: Not on file    Attends Archivist Meetings: Not on file    Marital Status: Not on file  Intimate Partner Violence: Not At Risk (02/15/2020)   Humiliation, Afraid, Rape, and Kick questionnaire    Fear of Current or Ex-Partner: No    Emotionally Abused: No    Physically Abused: No    Sexually Abused: No    Outpatient Medications Prior to Visit  Medication Sig Dispense Refill   acetaminophen (TYLENOL) 500 MG tablet Take 500 mg by mouth every 6 (six) hours as needed.     amLODipine (NORVASC) 5 MG tablet Take 1 tablet (5 mg total) by mouth daily. 30 tablet 11   aspirin EC 81 MG EC tablet Take 1 tablet (81 mg total) by mouth daily. Swallow whole. 30  tablet 11   atorvastatin (LIPITOR) 40 MG tablet Take 1 tablet (40 mg total) by mouth daily. 90 tablet 1   Carboxymethylcellulose Sodium (LUBRICANT EYE DROPS OP) Place 1 drop into both eyes daily as needed (Dry eye). Systane     citalopram (CELEXA) 40 MG tablet Take 1.5 tablets (60 mg total) by mouth daily. 90 tablet 0   clotrimazole-betamethasone (LOTRISONE) cream Apply 1 application. topically daily. 30 g 0   diphenhydrAMINE (BENADRYL) 25 MG tablet Take 25 mg by mouth every 6 (six) hours as needed for itching.     fluticasone (FLONASE) 50 MCG/ACT nasal spray Place 2 sprays into both nostrils daily as needed for allergies or rhinitis. 16 g 12   gabapentin (NEURONTIN) 300 MG capsule Take 1 capsule (300 mg total) by mouth 4 (four) times daily. 360 capsule 1   ibuprofen (ADVIL) 800 MG tablet Take 800 mg by mouth every 8 (eight) hours as needed.     lamoTRIgine (LAMICTAL) 200 MG tablet Take 1 tablet (200 mg total) by mouth daily. 90 tablet 0   methocarbamol (ROBAXIN) 500 MG tablet Take 500 mg by mouth as needed.     omeprazole (PRILOSEC) 40 MG capsule Take 40 mg by mouth daily before breakfast. Take 30 minutes before breakfast.     cyclobenzaprine (FLEXERIL) 5 MG tablet Take 1 tablet (5 mg total) by mouth 3 (three) times daily as needed. 15 tablet 0   lidocaine (LIDODERM) 5 % Place 1 patch onto the skin every 12 (twelve) hours as needed for up to 10 days. Remove & Discard patch after 12 hours of wear each day. (Patient not taking: Reported on 11/13/2021) 10 patch 0   No facility-administered medications prior to visit.    Allergies  Allergen Reactions   Hydroxyzine Hives and Rash   Dilaudid [Hydromorphone] Itching   Hydrocodone Hives and Rash    "  terrible scratching"  Make take with benadryl   Ketorolac Rash     May take with benadryl   Percocet [Oxycodone-Acetaminophen] Itching and Rash    May take with benadryl     Toradol [Ketorolac Tromethamine] Rash    May take with benadryl           Objective:    Physical Exam Constitutional:      General: She is not in acute distress.    Appearance: Normal appearance. She is normal weight. She is not ill-appearing, toxic-appearing or diaphoretic.  Cardiovascular:     Rate and Rhythm: Normal rate and regular rhythm.  Pulmonary:     Effort: Pulmonary effort is normal.     Breath sounds: Normal breath sounds.  Musculoskeletal:     Right shoulder: Decreased range of motion.     Cervical back: Pain with movement (all ranges of motion, slight relief when turning to right side) and muscular tenderness (left posterior neck) present. Decreased range of motion.     Comments: Right bka  Neurological:     General: No focal deficit present.     Mental Status: She is alert and oriented to person, place, and time.  Psychiatric:        Mood and Affect: Mood normal.        Behavior: Behavior normal.        Thought Content: Thought content normal.        Judgment: Judgment normal.       BP (!) 160/65   Pulse 69   Temp 98.3 F (36.8 C)   Resp 16   SpO2 97%  Wt Readings from Last 3 Encounters:  11/07/21 149 lb 14.6 oz (68 kg)  06/21/21 148 lb (67.1 kg)  05/01/21 147 lb (66.7 kg)     Health Maintenance Due  Topic Date Due   COVID-19 Vaccine (1) Never done   Pneumonia Vaccine 8+ Years old (1 - PCV) Never done   Hepatitis C Screening  Never done   TETANUS/TDAP  Never done   MAMMOGRAM  Never done   Zoster Vaccines- Shingrix (1 of 2) Never done   DEXA SCAN  Never done    There are no preventive care reminders to display for this patient.  Lab Results  Component Value Date   TSH 4.295 06/21/2021   Lab Results  Component Value Date   WBC 5.8 11/07/2021   HGB 10.7 (L) 11/07/2021   HCT 35.0 (L) 11/07/2021   MCV 80.8 11/07/2021   PLT 319 11/07/2021   Lab Results  Component Value Date   NA 134 (L) 11/07/2021   K 3.6 11/07/2021   CO2 22 11/07/2021   GLUCOSE 91 11/07/2021   BUN 10 11/07/2021    CREATININE 0.83 11/07/2021   BILITOT 0.3 06/21/2021   ALKPHOS 72 06/21/2021   AST 13 (L) 06/21/2021   ALT 9 06/21/2021   PROT 7.2 06/21/2021   ALBUMIN 3.3 (L) 06/21/2021   CALCIUM 8.5 (L) 11/07/2021   ANIONGAP 10 11/07/2021   GFR 88.58 08/01/2021   Lab Results  Component Value Date   CHOL 221 (H) 03/17/2020   Lab Results  Component Value Date   HDL 59 03/17/2020   Lab Results  Component Value Date   LDLCALC 150 (H) 03/17/2020   Lab Results  Component Value Date   TRIG 69 03/17/2020   Lab Results  Component Value Date   CHOLHDL 4.5 12/23/2014   Lab Results  Component Value Date   HGBA1C  5.3 12/23/2014      Assessment & Plan:   Problem List Items Addressed This Visit       Musculoskeletal and Integument   Anterolisthesis of cervical spine   Relevant Medications   traMADol (ULTRAM) 50 MG tablet   Other Relevant Orders   Ambulatory referral to Neurosurgery   Ambulatory referral to Physical Therapy   Facet arthropathy, cervical - Primary    Due to pain, advised pt to take robaxin for this, may help  Also rx for tramadol sent for pt. Advised pt to use with caution and may cause fatigue. Heat/ice to site Neck exercises as tolerated  Referrals placed for both neurosurgery as well as for physical therapy       Relevant Medications   traMADol (ULTRAM) 50 MG tablet   Other Relevant Orders   Ambulatory referral to Neurosurgery   Ambulatory referral to Physical Therapy    Meds ordered this encounter  Medications   traMADol (ULTRAM) 50 MG tablet    Sig: Take 1 tablet (50 mg total) by mouth every 8 (eight) hours as needed for up to 5 days.    Dispense:  15 tablet    Refill:  0    Order Specific Question:   Supervising Provider    Answer:   Diona Browner, AMY E [[id number]]    Follow-up: Return in about 30 days (around 12/13/2021) for for follow up on blood pressure.    Eugenia Pancoast, FNP

## 2021-11-13 NOTE — Patient Instructions (Addendum)
B12 on lower end recommend daily vitamin B12 1000 mcg once daily.   Take robaxin as needed for your muscle pain   Tramadol as needed for pain. Will only be giving short term pain medication.  Can consider diclofenac and or meloxicam.   A referral was placed today for neurosurgery.  Please let us know if you have not heard back within 2 weeks about the referral.   Due to recent changes in healthcare laws, you may see results of your imaging and/or laboratory studies on MyChart before I have had a chance to review them.  I understand that in some cases there may be results that are confusing or concerning to you. Please understand that not all results are received at the same time and often I may need to interpret multiple results in order to provide you with the best plan of care or course of treatment. Therefore, I ask that you please give me 2 business days to thoroughly review all your results before contacting my office for clarification. Should we see a critical lab result, you will be contacted sooner.   It was a pleasure seeing you today! Please do not hesitate to reach out with any questions and or concerns.  Regards,   Eugenia Pancoast FNP-C

## 2021-11-15 ENCOUNTER — Telehealth: Payer: Self-pay | Admitting: Family

## 2021-11-15 NOTE — Telephone Encounter (Signed)
Called and spoke to pt and she said she would wait until 8/1 to see the neurosurgeon.

## 2021-11-15 NOTE — Telephone Encounter (Signed)
Pt called in stated the referral to neurosurgery has no availability  until 12/04/21 and would like to be referred some somewhere else that will see her sooner . Please  Advise [phone]

## 2021-11-16 ENCOUNTER — Telehealth: Payer: Self-pay

## 2021-11-16 ENCOUNTER — Other Ambulatory Visit: Payer: Self-pay

## 2021-11-16 ENCOUNTER — Other Ambulatory Visit: Payer: Self-pay | Admitting: Family

## 2021-11-16 ENCOUNTER — Encounter: Payer: Self-pay | Admitting: Family

## 2021-11-16 DIAGNOSIS — F419 Anxiety disorder, unspecified: Secondary | ICD-10-CM

## 2021-11-16 DIAGNOSIS — Z736 Limitation of activities due to disability: Secondary | ICD-10-CM

## 2021-11-16 DIAGNOSIS — M4312 Spondylolisthesis, cervical region: Secondary | ICD-10-CM

## 2021-11-16 DIAGNOSIS — M6259 Muscle wasting and atrophy, not elsewhere classified, multiple sites: Secondary | ICD-10-CM

## 2021-11-16 DIAGNOSIS — E782 Mixed hyperlipidemia: Secondary | ICD-10-CM

## 2021-11-16 DIAGNOSIS — Z741 Need for assistance with personal care: Secondary | ICD-10-CM

## 2021-11-16 DIAGNOSIS — I10 Essential (primary) hypertension: Secondary | ICD-10-CM

## 2021-11-16 DIAGNOSIS — Z89511 Acquired absence of right leg below knee: Secondary | ICD-10-CM

## 2021-11-16 DIAGNOSIS — M62838 Other muscle spasm: Secondary | ICD-10-CM

## 2021-11-16 DIAGNOSIS — G479 Sleep disorder, unspecified: Secondary | ICD-10-CM

## 2021-11-16 DIAGNOSIS — M47812 Spondylosis without myelopathy or radiculopathy, cervical region: Secondary | ICD-10-CM

## 2021-11-16 DIAGNOSIS — R52 Pain, unspecified: Secondary | ICD-10-CM

## 2021-11-16 DIAGNOSIS — M810 Age-related osteoporosis without current pathological fracture: Secondary | ICD-10-CM

## 2021-11-16 DIAGNOSIS — F319 Bipolar disorder, unspecified: Secondary | ICD-10-CM

## 2021-11-16 MED ORDER — METHOCARBAMOL 500 MG PO TABS: ORAL_TABLET | ORAL | 0 refills | Status: DC

## 2021-11-16 NOTE — Telephone Encounter (Signed)
Raquel Sarna is calling from [facility], wanted to notify us that patient is not participating in physical therapy by skipping visits and when they come over she isnt wanting to work with them. Raquel Sarna is concerned for the patient.

## 2021-11-16 NOTE — Telephone Encounter (Signed)
[place] Night - Client Nonclinical Telephone Record  [facility] Client [facility] Night - Client Client Site [place] Provider AA - PHYSICIAN, NOT LISTED- MD Contact Type Call Who Is Calling Patient / Member / Family / Caregiver Caller Name [place] Phone Number [phone] Patient Name Sydney Evans Patient DOB [birth year] Call Type Message Only Information Provided Reason for Call Request for General Office Information Initial Comment Caller states she is getting low on her medications and needs refills. Additional Comment Office hours provided. Disp. Time Disposition Final User 11/15/2021 5:34:15 PM General Information Provided Yes Wilford Sports Call Closed By: Wilford Sports Transaction Date/Time: 11/15/2021 5:30:43 PM (ET

## 2021-11-16 NOTE — Telephone Encounter (Signed)
Sydney Evans,   Please call pt and inquire why she is not doing physical therapy? I know she has been in pain, but this would be helpful for her and is necessary.

## 2021-11-16 NOTE — Assessment & Plan Note (Addendum)
Due to pain, advised pt to take robaxin for this, may help  Also rx for tramadol sent for pt. Advised pt to use with caution and may cause fatigue. Heat/ice to site Neck exercises as tolerated  Referrals placed for both neurosurgery as well as for physical therapy

## 2021-11-16 NOTE — Telephone Encounter (Signed)
Let her know I sent her in more robaxin. I can only prescribe for acute pain x 5 days of medication with tramadol.   Please let her know I also put in a referral for social work to come to her home to assess what she needs helps with.

## 2021-11-16 NOTE — Telephone Encounter (Signed)
Called pt and said she is hurting to bad, and they told her if she is in pain then don't do it. Pt needs another Rx muscle relaxers and pain medication. She said they only gave her 5 days of medication. Pt stated that tramadol  is not working and was wondering if there is something else she can take?

## 2021-11-16 NOTE — Telephone Encounter (Signed)
I spoke with pt to see what med needed refill and pt said she needed refill on cyclobenzaprine 5 mg # 15 given by ED PA on 11/07/21. Pt said she is not taking robaxin. Pt also request refill on tramadol 50 mg #15 given on 11/13/21. Pt was seen for HFU on 11/13/21. Pt said back pain pain level is 9 and pt has appt with neurosurgeon on 12/13/21. Sending request to Red Christians FNP. [employer].

## 2021-11-21 ENCOUNTER — Telehealth: Payer: Self-pay | Admitting: Family

## 2021-11-21 NOTE — Telephone Encounter (Signed)
Orders given.  

## 2021-11-21 NOTE — Telephone Encounter (Signed)
Home Health verbal orders Caller Name: [place] Name: Moreen Fowler number: [phone]  Requesting OT/PT/Skilled nursing/Social Work/Speech: OT  Reason: Extend OT  Frequency: Once week for 6 weeks effective 12/10/21  Please forward to [facility] pool or providers CMA

## 2021-11-22 ENCOUNTER — Ambulatory Visit: Payer: Medicare Other | Admitting: Physical Therapy

## 2021-11-23 ENCOUNTER — Other Ambulatory Visit: Payer: Self-pay | Admitting: Nurse Practitioner

## 2021-11-23 DIAGNOSIS — F319 Bipolar disorder, unspecified: Secondary | ICD-10-CM

## 2021-11-26 ENCOUNTER — Telehealth: Payer: Self-pay | Admitting: *Deleted

## 2021-11-26 ENCOUNTER — Ambulatory Visit: Payer: Self-pay | Admitting: *Deleted

## 2021-11-26 NOTE — Chronic Care Management (AMB) (Signed)
  Care Coordination  Note  11/26/2021 Name: Dyanne Yorks MRN: [id number] DOB: [birth year]  Cambryn Charters is a 71 y.o. year old female who is a primary care patient of Eugenia Pancoast, [place]. I reached out to [name] by phone today to offer care coordination services.      Ms. Silverstein was given information about Care Coordination services today including:  The Care Coordination services include support from the care team which includes your Nurse Coordinator, Clinical Social Worker, or Pharmacist.  The Care Coordination team is here to help remove barriers to the health concerns and goals most important to you. Care Coordination services are voluntary and the patient may decline or stop services at any time by request to their care team member.   Patient agreed to services and verbal consent obtained.   Follow up plan: Telephone appointment with care coordination team member scheduled for: 11/26/2021  Julian Hy, [place] Direct Dial: [phone]

## 2021-11-26 NOTE — Chronic Care Management (AMB) (Signed)
  Care Coordination  Note  11/26/2021 Name: Koraima Albertsen MRN: [id number] DOB: 1950/07/27  Marguerette Sheller is a 71 y.o. year old female who is a primary care patient of Eugenia Pancoast, [place]. I reached out to [name] by phone today to offer care coordination services.       Follow up plan: Unsuccessful telephone outreach attempt made. A HIPAA compliant phone message was left for the patient providing contact information and requesting a return call.   Julian Hy, [place] Direct Dial: [phone]

## 2021-11-27 ENCOUNTER — Ambulatory Visit (INDEPENDENT_AMBULATORY_CARE_PROVIDER_SITE_OTHER): Payer: Medicare Other | Admitting: Family

## 2021-11-27 ENCOUNTER — Encounter: Payer: Self-pay | Admitting: Family

## 2021-11-27 ENCOUNTER — Ambulatory Visit: Payer: Medicare Other | Attending: Family | Admitting: Physical Therapy

## 2021-11-27 ENCOUNTER — Ambulatory Visit (INDEPENDENT_AMBULATORY_CARE_PROVIDER_SITE_OTHER)
Admission: RE | Admit: 2021-11-27 | Discharge: 2021-11-27 | Disposition: A | Discharge status: Home / Self Care | Payer: Medicare Other | Source: Ambulatory Visit | Attending: Family | Admitting: Family

## 2021-11-27 VITALS — BP 164/78 | HR 62 | Temp 98.6°F | Resp 16 | Ht 62.0 in | Wt 147.0 lb

## 2021-11-27 DIAGNOSIS — R0609 Other forms of dyspnea: Secondary | ICD-10-CM

## 2021-11-27 DIAGNOSIS — R0781 Pleurodynia: Secondary | ICD-10-CM | POA: Insufficient documentation

## 2021-11-27 DIAGNOSIS — S4991XA Unspecified injury of right shoulder and upper arm, initial encounter: Secondary | ICD-10-CM | POA: Insufficient documentation

## 2021-11-27 DIAGNOSIS — S3992XA Unspecified injury of lower back, initial encounter: Secondary | ICD-10-CM

## 2021-11-27 DIAGNOSIS — Z741 Need for assistance with personal care: Secondary | ICD-10-CM

## 2021-11-27 DIAGNOSIS — R52 Pain, unspecified: Secondary | ICD-10-CM

## 2021-11-27 DIAGNOSIS — R0789 Other chest pain: Secondary | ICD-10-CM

## 2021-11-27 DIAGNOSIS — Z789 Other specified health status: Secondary | ICD-10-CM | POA: Insufficient documentation

## 2021-11-27 DIAGNOSIS — S59901A Unspecified injury of right elbow, initial encounter: Secondary | ICD-10-CM

## 2021-11-27 DIAGNOSIS — Z736 Limitation of activities due to disability: Secondary | ICD-10-CM

## 2021-11-27 DIAGNOSIS — R29898 Other symptoms and signs involving the musculoskeletal system: Secondary | ICD-10-CM | POA: Insufficient documentation

## 2021-11-27 DIAGNOSIS — S51011A Laceration without foreign body of right elbow, initial encounter: Secondary | ICD-10-CM | POA: Insufficient documentation

## 2021-11-27 MED ORDER — TRAMADOL HCL 50 MG PO TABS: 50.0000 mg | ORAL_TABLET | Freq: Three times a day (TID) | ORAL | 0 refills | Status: AC | PRN

## 2021-11-27 MED ORDER — DOXYCYCLINE HYCLATE 100 MG PO TABS: 100.0000 mg | ORAL_TABLET | Freq: Two times a day (BID) | ORAL | 0 refills | Status: AC

## 2021-11-27 NOTE — Progress Notes (Signed)
Established Patient Office Visit  Subjective:  Patient ID: Sydney Evans, female    DOB: 12/19/1950  Age: 71 y.o. 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   Pt currently uses a standard wheelchair. Hard for her to use with arms due to weakness in arms, and pain to shoulder with pushing wheels. Very limiting.   Pt is with acute concerns.  Recent fall where she resulted in a bruise on her right lower abdomen and also with some chest pain. She fell three days ago. Went to stand up and lost her balance, landed on a table and a wall socket. Also hit her right elbow. She also c/o mid to upper back pain. Some DOE since the fall.   Pt currently living alone.  Her friend wendy accompanies her as best she can throughout the day but not 24/7 care, she is alone throughout most of the day when wendy is at work. She has an alert button but it is sitting at home on her table. She does have a neighbor that checks on her as well.   Pt does have appt with orthopedist on Friday as well.   Trying to eat is hard for her to do, due to shoulder restriction and shaking of bil hands from the pain in her neck and shoulder. Hard to get around due to pain and instability. Hard to get herself dressed and or groomed due to pain and instability.    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   Carotid arterial disease ([place])    Contracture of joint of upper arm    Critical limb ischemia of right lower extremity ([place]) 06/21/2021   Depression    Dislocation of right shoulder joint 10/06/2019   Disorder of bone and articular cartilage 09/29/2014   Elevated lipids    GERD (gastroesophageal reflux disease)    Hypertension    Migraine    Osteoporosis    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   Poor balance    Rotator cuff tendinitis, left 05/20/2016   Sinus congestion    Status post total shoulder arthroplasty, 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   Status post total shoulder replacement, 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   Stroke ([place])    x 2   TIA (transient ischemic attack)    Traumatic complete tear of right rotator cuff 10/08/2019   Vertigo     Past Surgical History:  Procedure Laterality Date   ABDOMINAL HYSTERECTOMY     pt states might still have ovaries  BACK SURGERY     bka right     BREAST BIOPSY Left    CARPAL TUNNEL RELEASE Bilateral    CATARACT EXTRACTION W/ INTRAOCULAR LENS  IMPLANT, BILATERAL Bilateral    CHOLECYSTECTOMY     COLONOSCOPY WITH PROPOFOL N/A 04/09/2018   Procedure: COLONOSCOPY WITH PROPOFOL;  Surgeon: Jonathon Bellows, MD;  Location: [facility] ENDOSCOPY;  Service: Gastroenterology;  Laterality: N/A;   crystal cyst removed on left foot     ESOPHAGOGASTRODUODENOSCOPY (EGD) WITH PROPOFOL N/A 04/09/2018   Procedure: ESOPHAGOGASTRODUODENOSCOPY (EGD) WITH PROPOFOL;  Surgeon: Jonathon Bellows, MD;  Location: [facility] ENDOSCOPY;  Service: Gastroenterology;  Laterality: N/A;   ESOPHAGOGASTRODUODENOSCOPY (EGD) WITH PROPOFOL N/A 12/14/2019   Procedure: ESOPHAGOGASTRODUODENOSCOPY (EGD) WITH PROPOFOL;  Surgeon: Lesly Rubenstein, MD;  Location: [facility] ENDOSCOPY;  Service: Endoscopy;  Laterality: N/A;   EYE SURGERY      femoral fx     FOOT SURGERY     GIVENS CAPSULE STUDY N/A 06/16/2018   Procedure: GIVENS CAPSULE STUDY;  Surgeon: Jonathon Bellows, MD;  Location: [facility] ENDOSCOPY;  Service: Gastroenterology;  Laterality: N/A;   JOINT REPLACEMENT     KNEE SURGERY     LOWER EXTREMITY ANGIOGRAPHY Right 06/21/2021   Procedure: Lower Extremity Angiography;  Surgeon: Katha Cabal, MD;  Location: [place] CV LAB;  Service: Cardiovascular;  Laterality: Right;   ORIF PERIPROSTHETIC FRACTURE Right 10/07/2019   Procedure: OPEN REDUCTION INTERNAL FIXATION (ORIF) PERIPROSTHETIC FRACTURE;  Surgeon: Corky Mull, MD;  Location: [facility] ORS;  Service: Orthopedics;  Laterality: Right;   TONSILLECTOMY     TOTAL KNEE ARTHROPLASTY Bilateral    TOTAL SHOULDER ARTHROPLASTY Left 08/01/2016   Procedure: TOTAL SHOULDER ARTHROPLASTY;  Surgeon: Corky Mull, MD;  Location: [facility] ORS;  Service: Orthopedics;  Laterality: Left;   TOTAL SHOULDER ARTHROPLASTY Right 09/30/2019   Procedure: TOTAL SHOULDER ARTHROPLASTY;  Surgeon: Corky Mull, MD;  Location: [facility] ORS;  Service: Orthopedics;  Laterality: Right;    Family History  Problem Relation Age of Onset   Other Mother        ?lupus    Cancer Mother    High Cholesterol Mother    CAD Father        CABG   High Cholesterol Father    Arthritis Father    Heart murmur Sister    Bradycardia Sister    Heart murmur Brother    High blood pressure Other        "for everybody"   High Cholesterol Other        "for everybody"    Social History   Socioeconomic History   Marital status: Single    Spouse name: Not on file   Number of children: 3   Years of education: Not on file   Highest education level: Some college, no degree  Occupational History   Not on file  Tobacco Use   Smoking status: Every Day    Packs/day: 0.50    Types: Cigarettes    Last attempt to quit: 08/04/2016    Years since quitting: 5.3   Smokeless tobacco: Never  Vaping Use   Vaping Use: Never used   Substance and Sexual Activity   Alcohol use: No   Drug use: No   Sexual activity: Not Currently    Birth control/protection: Surgical  Other Topics Concern   Not on file  Social History Narrative   Lives at home alone in an apt   Right handed   Disabled since 1998   Caffeine: about  30 oz daily   Social Determinants of Health   Financial Resource Strain: Low Risk  (02/15/2020)   Overall Financial Resource Strain (CARDIA)    Difficulty of Paying Living Expenses: Not hard at all  Food Insecurity: No Food Insecurity (11/26/2021)   Hunger Vital Sign    Worried About Running Out of Food in the Last Year: Never true    Ran Out of Food in the Last Year: Never true  Transportation Needs: No Transportation Needs (02/15/2020)   PRAPARE - Hydrologist (Medical): No    Lack of Transportation (Non-Medical): No  Physical Activity: Insufficiently Active (11/26/2021)   Exercise Vital Sign    Days of Exercise per Week: 2 days    Minutes of Exercise per Session: 30 min  Stress: Stress Concern Present (02/15/2020)   [place]    Feeling of Stress : Very much  Social Connections: Unknown (02/15/2020)   Social Connection and Isolation Panel [NHANES]    Frequency of Communication with Friends and Family: More than three times a week    Frequency of Social Gatherings with Friends and Family: More than three times a week    Attends Religious Services: Not on file    Active Member of Clubs or Organizations: Not on file    Attends Archivist Meetings: Not on file    Marital Status: Not on file  Intimate Partner Violence: Not At Risk (02/15/2020)   Humiliation, Afraid, Rape, and Kick questionnaire    Fear of Current or Ex-Partner: No    Emotionally Abused: No    Physically Abused: No    Sexually Abused: No    Outpatient Medications Prior to Visit  Medication Sig Dispense Refill    acetaminophen (TYLENOL) 500 MG tablet Take 500 mg by mouth every 6 (six) hours as needed.     amLODipine (NORVASC) 5 MG tablet Take 1 tablet (5 mg total) by mouth daily. 30 tablet 11   aspirin EC 81 MG EC tablet Take 1 tablet (81 mg total) by mouth daily. Swallow whole. 30 tablet 11   atorvastatin (LIPITOR) 40 MG tablet Take 1 tablet (40 mg total) by mouth daily. 90 tablet 1   Carboxymethylcellulose Sodium (LUBRICANT EYE DROPS OP) Place 1 drop into both eyes daily as needed (Dry eye). Systane     citalopram (CELEXA) 40 MG tablet Take 1.5 tablets (60 mg total) by mouth daily. 90 tablet 0   clotrimazole-betamethasone (LOTRISONE) cream Apply 1 application. topically daily. 30 g 0   diphenhydrAMINE (BENADRYL) 25 MG tablet Take 25 mg by mouth every 6 (six) hours as needed for itching.     fluticasone (FLONASE) 50 MCG/ACT nasal spray Place 2 sprays into both nostrils daily as needed for allergies or rhinitis. 16 g 12   gabapentin (NEURONTIN) 300 MG capsule Take 1 capsule (300 mg total) by mouth 4 (four) times daily. 360 capsule 1   ibuprofen (ADVIL) 800 MG tablet Take 800 mg by mouth every 8 (eight) hours as needed.     lamoTRIgine (LAMICTAL) 200 MG tablet Take 1 tablet (200 mg total) by mouth daily. 90 tablet 0   methocarbamol (ROBAXIN) 500 MG tablet Take one po qhs prn muscle spasm 30 tablet 0   omeprazole (PRILOSEC) 40 MG capsule Take 40 mg by mouth daily before breakfast. Take 30 minutes before breakfast.     No facility-administered medications prior to visit.    Allergies  Allergen Reactions  Hydroxyzine Hives and Rash   Dilaudid [Hydromorphone] Itching   Hydrocodone Hives and Rash    "terrible scratching"  Make take with benadryl   Ketorolac Rash     May take with benadryl   Percocet [Oxycodone-Acetaminophen] Itching and Rash    May take with benadryl     Toradol [Ketorolac Tromethamine] Rash    May take with benadryl          Objective:    Physical Exam Constitutional:       General: She is not in acute distress.    Appearance: Normal appearance. She is normal weight. She is not ill-appearing, toxic-appearing or diaphoretic.  Pulmonary:     Comments: Pain with breathing  Chest:     Chest wall: Tenderness (right lower rib area worse with movement) present.    Abdominal:     General: Abdomen is flat.     Tenderness: There is abdominal tenderness (right directly below rib with bruising).  Musculoskeletal:     Right shoulder: Tenderness present. Decreased range of motion (due to pain). Decreased strength.     Right elbow: Swelling present. Decreased range of motion (painful with flexion and hyperextension). Tenderness present.     Thoracic back: Swelling (mid back with swelling to the right side midline), signs of trauma and tenderness present.     Right foot: Normal range of motion.     Left foot: Normal range of motion.     Comments: 2/5 strength with lle with flexion with calf and also elevation of right thigh. LLE sttrength 1/5 and lue 2/5, rue 1/5 strength   Right elbow laceration with clear discharge and some surrounding erythema  Right shoulder, positive apley, positive impingement with external rotation, can barely lift right shoulder   Pain with movement, currently 8/10, shakes due to pain at times      Right Lower Extremity: Right leg is amputated below knee.  Feet:     Left foot:     Skin integrity: Skin integrity normal.     Toenail Condition: Left toenails are normal.     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     Cranial Nerves: Cranial nerves 2-12 are intact.     Motor: Weakness, tremor (bil hands) and abnormal muscle tone present.     Gait: Gait abnormal (unsteady, non-ambulatory at current due to BKA status).  Psychiatric:        Mood and Affect: Mood normal.        Behavior: Behavior normal.        Thought Content: Thought content  normal.        Judgment: Judgment normal.      BP (!) 164/78   Pulse 62   Temp 98.6 F (37 C)   Resp 16   SpO2 97%  Wt Readings from Last 3 Encounters:  11/07/21 149 lb 14.6 oz (68 kg)  06/21/21 148 lb (67.1 kg)  05/01/21 147 lb (66.7 kg)     Health Maintenance Due  Topic Date Due   COVID-19 Vaccine (1) Never done   Pneumonia Vaccine 13+ Years old (1 - PCV) Never done   Hepatitis C Screening  Never done   TETANUS/TDAP  Never done   MAMMOGRAM  Never done   Zoster Vaccines- Shingrix (1 of 2) Never done   DEXA SCAN  Never done    There are no preventive  care reminders to display for this patient.  Lab Results  Component Value Date   TSH 4.295 06/21/2021   Lab Results  Component Value Date   WBC 5.8 11/07/2021   HGB 10.7 (L) 11/07/2021   HCT 35.0 (L) 11/07/2021   MCV 80.8 11/07/2021   PLT 319 11/07/2021   Lab Results  Component Value Date   NA 134 (L) 11/07/2021   K 3.6 11/07/2021   CO2 22 11/07/2021   GLUCOSE 91 11/07/2021   BUN 10 11/07/2021   CREATININE 0.83 11/07/2021   BILITOT 0.3 06/21/2021   ALKPHOS 72 06/21/2021   AST 13 (L) 06/21/2021   ALT 9 06/21/2021   PROT 7.2 06/21/2021   ALBUMIN 3.3 (L) 06/21/2021   CALCIUM 8.5 (L) 11/07/2021   ANIONGAP 10 11/07/2021   GFR 88.58 08/01/2021   Lab Results  Component Value Date   CHOL 221 (H) 03/17/2020   Lab Results  Component Value Date   HDL 59 03/17/2020   Lab Results  Component Value Date   LDLCALC 150 (H) 03/17/2020   Lab Results  Component Value Date   TRIG 69 03/17/2020   Lab Results  Component Value Date   CHOLHDL 4.5 12/23/2014   Lab Results  Component Value Date   HGBA1C 5.3 12/23/2014      Assessment & Plan:   Problem List Items Addressed This Visit       Other   Limitation of activity due to disability    Writing rx for hoveraround      Need for assistance with personal care    Referral to coc in place pending      Pain    pdmp reviewed Tramadol today for pain   Use cautiously as to avoid increased risk for falls      Elbow injury, right, initial encounter    Xray elbow today pending results Tramadol today for pain  Use cautiously as to avoid increased risk for falls      Relevant Medications   traMADol (ULTRAM) 50 MG tablet   Other Relevant Orders   DG Elbow Complete Right   DOE (dyspnea on exertion) - Primary    cxr today to r/o any injury from fall      Relevant Orders   DG Chest 2 View   Rib pain on right side    Right rib xray today pending results Tramadol today for pain  Use cautiously as to avoid increased risk for falls      Relevant Medications   traMADol (ULTRAM) 50 MG tablet   Other Relevant Orders   DG Ribs Unilateral Right   Elbow laceration, right, initial encounter    RX doxycycline 100 mg po bid x 10 days Monitor daily for worsening s/s infection       Relevant Medications   doxycycline (VIBRA-TABS) 100 MG tablet   traMADol (ULTRAM) 50 MG tablet   Injury of back    Thoracic xray pending results  Ice to site as able      Relevant Medications   traMADol (ULTRAM) 50 MG tablet   Other Relevant Orders   DG Thoracic Spine W/Swimmers   Injury of right shoulder    Right shoulder xray pending Also f/u with orthopedist as scheduled Tramadol today for pain  Use cautiously as to avoid increased risk for falls      Relevant Medications   traMADol (ULTRAM) 50 MG tablet   Other Relevant Orders   DG Shoulder Right   LUE weakness  RUE weakness   Weakness of both lower extremities    Attempt to get hoveraround to safety get around home Also referral placed already for social work/coordination of care as pt needs more care at home.       Activity of daily living alteration    A manual wheelchair would not be helpful due to 1/5 strength rue as well as 2/5 strength left upper extremity for pushing along the wheelchair, very hard for pt due to pain as well.  Scooter not helpful as pt can not use due to lack of  postural stability due to BKA right le   MRADLs impaired in the home:  Pt experiences trouble getting to and from the toilet and bathroom to bathe and toilet  Also trouble with preparing meals to get to the kitchen as well as PMD is necessary to get to the bedroom to groom/dress herself.   Cane/walker not helpful due to unstable gait due to BKA right le and history of falls.  Prescribed equipment to be used in the home and in the community to get out and about and around.  This pt can safetly operate the power mobility divide both mentally and physically.  Pt is willing and motivated to use the power mobility device in her home.   Extensive workup today for recent fall as well as going over acute concerns as well as placing order and filling out paperwork for DME totaled 53 minutes.        Meds ordered this encounter  Medications   doxycycline (VIBRA-TABS) 100 MG tablet    Sig: Take 1 tablet (100 mg total) by mouth 2 (two) times daily for 10 days.    Dispense:  20 tablet    Refill:  0    Order Specific Question:   Supervising Provider    Answer:   BEDSOLE, AMY E [[id number]]   traMADol (ULTRAM) 50 MG tablet    Sig: Take 1 tablet (50 mg total) by mouth every 8 (eight) hours as needed for up to 5 days.    Dispense:  15 tablet    Refill:  0    Order Specific Question:   Supervising Provider    Answer:   Diona Browner, AMY E [[id number]]    Follow-up: Return in about 1 month (around 12/28/2021) for regular follow up appointment.    Eugenia Pancoast, FNP

## 2021-11-27 NOTE — Assessment & Plan Note (Addendum)
A manual wheelchair would not be helpful due to 1/5 strength rue as well as 2/5 strength left upper extremity for pushing along the wheelchair, very hard for pt due to pain as well.  Scooter not helpful as pt can not use due to lack of postural stability due to BKA right le   MRADLs impaired in the home:  Pt experiences trouble getting to and from the toilet and bathroom to bathe and toilet  Also trouble with preparing meals to get to the kitchen as well as PMD is necessary to get to the bedroom to groom/dress herself.   Cane/walker not helpful due to unstable gait due to BKA right le and history of falls.  Prescribed equipment to be used in the home and in the community to get out and about and around.  This pt can safetly operate the power mobility divide both mentally and physically.  Pt is willing and motivated to use the power mobility device in her home.   Extensive workup today for recent fall as well as going over acute concerns as well as placing order and filling out paperwork for DME totaled 53 minutes.

## 2021-11-27 NOTE — Assessment & Plan Note (Signed)
Referral to coc in place pending

## 2021-11-27 NOTE — Patient Outreach (Signed)
  Care Coordination   Initial Visit Note   11/27/2021 Name: Sydney Evans MRN: [id number] DOB: 11-19-1950  Sydney Evans is a 71 y.o. year old female who sees Eugenia Pancoast, [place] for primary care. I spoke with  Sydney Evans by phone today  What matters to the patients health and wellness today?  Additional personal care hours   Goals Addressed               This Visit's Progress     I need additional in home care hours (pt-stated)        Care Coordination Interventions:  Patient confirmed that she has in home care services 3 days a week for 2 hours through [place]. Patient requesting additional hours and would like to be re-assessed due to need for additional hours PHQ2/PHQ9 completed Active listening / Reflection utilized  Emotional Support Provided related to lack of social support Verbalization of feelings encouraged  Request for reassessment to be faxed to patient's provider for signature if agreeable         SDOH assessments and interventions completed:   Yes SDOH Interventions Today    Flowsheet Row Most Recent Value  SDOH Interventions   Physical Activity Interventions Other (Comments)  [Requesting additonal in home care hours]       Care Coordination Interventions Activated:  Yes Care Coordination Interventions:  Yes, provided  Follow up plan: Follow up call scheduled for 12/11/21  Encounter Outcome:  Pt. Visit Completed

## 2021-11-27 NOTE — Assessment & Plan Note (Signed)
Writing rx for hoveraround

## 2021-11-27 NOTE — Assessment & Plan Note (Signed)
RX doxycycline 100 mg po bid x 10 days Monitor daily for worsening s/s infection

## 2021-11-27 NOTE — Assessment & Plan Note (Signed)
Right shoulder xray pending Also f/u with orthopedist as scheduled Tramadol today for pain  Use cautiously as to avoid increased risk for falls

## 2021-11-27 NOTE — Assessment & Plan Note (Signed)
Right rib xray today pending results Tramadol today for pain  Use cautiously as to avoid increased risk for falls

## 2021-11-27 NOTE — Assessment & Plan Note (Signed)
cxr today to r/o any injury from fall

## 2021-11-27 NOTE — Assessment & Plan Note (Signed)
Xray elbow today pending results Tramadol today for pain  Use cautiously as to avoid increased risk for falls

## 2021-11-27 NOTE — Assessment & Plan Note (Signed)
Attempt to get hoveraround to safety get around home Also referral placed already for social work/coordination of care as pt needs more care at home.

## 2021-11-27 NOTE — Patient Instructions (Signed)
Visit Information  Thank you for taking time to visit with me today. Please don't hesitate to contact me if I can be of assistance to you.   Following are the goals we discussed today:   Goals Addressed               This Visit's Progress     I need additional in home care hours (pt-stated)        Care Coordination Interventions:  Patient confirmed that she has in home care services 3 days a week for 2 hours through [place]. Patient requesting additional hours and would like to be re-assessed due to need for additional hours PHQ2/PHQ9 completed Active listening / Reflection utilized  Emotional Support Provided related to lack of social support Verbalization of feelings encouraged  Request for reassessment to be faxed to patient's provider for signature if agreeable         Our next appointment is by telephone on 12/11/21 at 10am  Please call the care guide team at [phone] if you need to cancel or reschedule your appointment.   If you are experiencing a Mental Health or [place] or need someone to talk to, please call the Suicide and Crisis Lifeline: 988   Patient verbalizes understanding of instructions and care plan provided today and agrees to view in [place]. Active MyChart status and patient understanding of how to access instructions and care plan via MyChart confirmed with patient.     Sheralyn Boatman [facility] Care Management [phone]

## 2021-11-27 NOTE — Patient Instructions (Signed)
Complete xray(s) prior to leaving today. I will notify you of your results once received.  Due to recent changes in healthcare laws, you may see results of your imaging and/or laboratory studies on MyChart before I have had a chance to review them.  I understand that in some cases there may be results that are confusing or concerning to you. Please understand that not all results are received at the same time and often I may need to interpret multiple results in order to provide you with the best plan of care or course of treatment. Therefore, I ask that you please give me 2 business days to thoroughly review all your results before contacting my office for clarification. Should we see a critical lab result, you will be contacted sooner.   It was a pleasure seeing you today! Please do not hesitate to reach out with any questions and or concerns.  Regards,   Eugenia Pancoast FNP-C

## 2021-11-27 NOTE — Assessment & Plan Note (Signed)
Thoracic xray pending results  Ice to site as able

## 2021-11-27 NOTE — Assessment & Plan Note (Addendum)
pdmp reviewed Tramadol today for pain  Use cautiously as to avoid increased risk for falls

## 2021-11-29 ENCOUNTER — Ambulatory Visit: Payer: Medicare Other | Admitting: Physical Therapy

## 2021-11-29 NOTE — Progress Notes (Signed)
Right rib xray without fracture.  There is a stable sclerotic density (bone calcification) seen that is stable not concerning per radiologist.

## 2021-11-29 NOTE — Progress Notes (Signed)
Right elbow with osteoarthritis. No acte fracture.  Would pt like to start something called meloxicam daily? Might help a bit more for the pain. Would not take ibuprofen anymore as this is same thing but bit better.

## 2021-12-03 ENCOUNTER — Other Ambulatory Visit: Payer: Self-pay | Admitting: Student

## 2021-12-04 ENCOUNTER — Encounter: Payer: Medicare Other | Admitting: Physical Therapy

## 2021-12-04 ENCOUNTER — Other Ambulatory Visit: Payer: Self-pay | Admitting: Student

## 2021-12-04 DIAGNOSIS — M25511 Pain in right shoulder: Secondary | ICD-10-CM

## 2021-12-06 ENCOUNTER — Encounter: Payer: Medicare Other | Admitting: Physical Therapy

## 2021-12-06 DIAGNOSIS — Z9181 History of falling: Secondary | ICD-10-CM

## 2021-12-06 DIAGNOSIS — K219 Gastro-esophageal reflux disease without esophagitis: Secondary | ICD-10-CM

## 2021-12-06 DIAGNOSIS — I739 Peripheral vascular disease, unspecified: Secondary | ICD-10-CM

## 2021-12-06 DIAGNOSIS — I69354 Hemiplegia and hemiparesis following cerebral infarction affecting left non-dominant side: Secondary | ICD-10-CM

## 2021-12-06 DIAGNOSIS — F319 Bipolar disorder, unspecified: Secondary | ICD-10-CM

## 2021-12-06 DIAGNOSIS — Z96643 Presence of artificial hip joint, bilateral: Secondary | ICD-10-CM

## 2021-12-06 DIAGNOSIS — Z7902 Long term (current) use of antithrombotics/antiplatelets: Secondary | ICD-10-CM

## 2021-12-06 DIAGNOSIS — I6521 Occlusion and stenosis of right carotid artery: Secondary | ICD-10-CM | POA: Diagnosis not present

## 2021-12-06 DIAGNOSIS — I10 Essential (primary) hypertension: Secondary | ICD-10-CM | POA: Diagnosis not present

## 2021-12-06 DIAGNOSIS — N390 Urinary tract infection, site not specified: Secondary | ICD-10-CM | POA: Diagnosis not present

## 2021-12-06 DIAGNOSIS — M810 Age-related osteoporosis without current pathological fracture: Secondary | ICD-10-CM

## 2021-12-06 DIAGNOSIS — H9192 Unspecified hearing loss, left ear: Secondary | ICD-10-CM

## 2021-12-06 DIAGNOSIS — Z87891 Personal history of nicotine dependence: Secondary | ICD-10-CM

## 2021-12-06 DIAGNOSIS — Z96653 Presence of artificial knee joint, bilateral: Secondary | ICD-10-CM

## 2021-12-06 DIAGNOSIS — Z86718 Personal history of other venous thrombosis and embolism: Secondary | ICD-10-CM

## 2021-12-06 DIAGNOSIS — Z4781 Encounter for orthopedic aftercare following surgical amputation: Secondary | ICD-10-CM | POA: Diagnosis not present

## 2021-12-06 DIAGNOSIS — Z87440 Personal history of urinary (tract) infections: Secondary | ICD-10-CM

## 2021-12-06 DIAGNOSIS — Z7982 Long term (current) use of aspirin: Secondary | ICD-10-CM

## 2021-12-06 DIAGNOSIS — D509 Iron deficiency anemia, unspecified: Secondary | ICD-10-CM

## 2021-12-06 DIAGNOSIS — Z89511 Acquired absence of right leg below knee: Secondary | ICD-10-CM

## 2021-12-10 ENCOUNTER — Telehealth: Payer: Self-pay | Admitting: Family

## 2021-12-10 NOTE — Telephone Encounter (Signed)
Annet fron [place] called in to know status of paperwork that was fax in requesting additional personal care . Please Advise # [phone]

## 2021-12-11 ENCOUNTER — Encounter: Payer: Medicare Other | Admitting: Physical Therapy

## 2021-12-11 ENCOUNTER — Ambulatory Visit: Payer: Self-pay | Admitting: *Deleted

## 2021-12-11 ENCOUNTER — Encounter: Payer: Self-pay | Admitting: *Deleted

## 2021-12-11 NOTE — Patient Outreach (Signed)
  Care Coordination   Follow Up Visit Note   12/11/2021 Name: Sydney Evans MRN: [id number] DOB: May 20, 1950  Sydney Evans is a 71 y.o. year old female who sees Eugenia Pancoast, [place] for primary care. I spoke with  [name] by phone today  What matters to the patients health and wellness today?  Need for additional in home care hours    Goals Addressed               This Visit's Progress     I need additional in home care hours (pt-stated)        Care Coordination Interventions:  Patient confirmed that she continues to have in home care services 3 days a week for 2 hours through [place]. Patient requesting additional hours and would like to be re-assessed due to need for additional hours [facility] also requesting status of request form, message sent to patient's provider for status of request for another assessment to be done for additional in home care hours-form to be re-faxed if needed Active listening / Reflection utilized  Emotional Support Provided related to lack of social support Verbalization of feelings encouraged         SDOH assessments and interventions completed:  Yes  SDOH Interventions Today    Flowsheet Row Most Recent Value  SDOH Interventions   Food Insecurity Interventions Intervention Not Indicated  Housing Interventions Intervention Not Indicated        Care Coordination Interventions Activated:  Yes  Care Coordination Interventions:  Yes, provided   Follow up plan: Follow up call scheduled for 12/18/21    Encounter Outcome:  Pt. Visit Completed

## 2021-12-11 NOTE — Patient Instructions (Signed)
Visit Information  Thank you for taking time to visit with me today. Please don't hesitate to contact me if I can be of assistance to you.   Following are the goals we discussed today:   Goals Addressed               This Visit's Progress     I need additional in home care hours (pt-stated)        Care Coordination Interventions:  Patient confirmed that she continues to have in home care services 3 days a week for 2 hours through [place]. Patient requesting additional hours and would like to be re-assessed due to need for additional hours [facility] also requesting status of request form, message sent to patient's provider for status of request for another assessment to be done for additional in home care hours-form to be re-faxed if needed Active listening / Reflection utilized  Emotional Support Provided related to lack of social support Verbalization of feelings encouraged         Our next appointment is by telephone on 12/18/21 at 11:30am  Please call the care guide team at [phone] if you need to cancel or reschedule your appointment.   If you are experiencing a Mental Health or [place] or need someone to talk to, please call the Suicide and Crisis Lifeline: 988   Patient verbalizes understanding of instructions and care plan provided today and agrees to view in [place]. Active MyChart status and patient understanding of how to access instructions and care plan via MyChart confirmed with patient.       Elliot Gurney, [place] Worker  [facility] Care Management [phone]

## 2021-12-12 ENCOUNTER — Ambulatory Visit: Payer: Self-pay | Admitting: *Deleted

## 2021-12-12 NOTE — Telephone Encounter (Signed)
-----   Message from Eugenia Pancoast, [place] sent at 12/12/2021  7:47 AM EDT ----- Regarding: paperwork Hi [name]  Thank you for reaching out. I have completed it and have my Assistant faxing off today. Hopefully this allows for her to have more care available.   [name], if you could please fax this from my outbox after completing the information in the practice stamp.   ----- Message ----- From: Vern Claude, LCSW Sent: 12/11/2021   2:44 PM EDT To: Eugenia Pancoast, FNP   ----- Message ----- From: Vern Claude, LCSW Sent: 11/28/2021   1:24 PM EDT To: Eugenia Pancoast, FNP  Hello [name],  I am the social worker working with [name]. She is asking to be re-assessed for additional in home care hours. IShe is currently receiving 2 hours 3 days a week. I was hoping that you could complete the request form for another Independent Assessment . I will fax it to your attention today.   Thanks again,   [facility] Care Management [phone]

## 2021-12-12 NOTE — Patient Outreach (Signed)
  Care Coordination   Follow Up Visit Note   12/12/2021 Name: Sydney Evans MRN: [id number] DOB: 1950-12-27  Sydney Evans is a 71 y.o. year old female who sees Eugenia Pancoast, [place] for primary care. I spoke with  Sydney Evans by phone today  What matters to the patients health and wellness today? In home care needs    Goals Addressed               This Visit's Progress     I need additional in home care hours (pt-stated)        Care Coordination Interventions:  Patient confirmed that she continues to have in home care services 3 days a week for 2 hours through [place]. Patient requesting additional hours and would like to be re-assessed due to need for additional hours Patient states that she is having pain in her back, states that she is trying to contact here chiropractor. CSW suggested follow up with her provider as well  Status of request form for in home care received, per message from patient's provider, form faxed to [employer] today Difference between home health and in home aid care clarified Active listening / Reflection utilized  Emotional Support Provided related to lack of social support Verbalization of feelings encouraged         SDOH assessments and interventions completed:  Yes     Care Coordination Interventions Activated:  Yes  Care Coordination Interventions:  Yes, provided   Follow up plan: Follow up call scheduled for 12/18/21    Encounter Outcome:  Pt. Visit Completed

## 2021-12-12 NOTE — Telephone Encounter (Signed)
Forms faxed 8/9 @ 12:56 pm

## 2021-12-12 NOTE — Patient Instructions (Signed)
Visit Information  Thank you for taking time to visit with me today. Please don't hesitate to contact me if I can be of assistance to you.   Following are the goals we discussed today:   Goals Addressed               This Visit's Progress     I need additional in home care hours (pt-stated)        Care Coordination Interventions:  Patient confirmed that she continues to have in home care services 3 days a week for 2 hours through [place]. Patient requesting additional hours and would like to be re-assessed due to need for additional hours Patient states that she is having pain in her back, states that she is trying to contact here chiropractor. CSW suggested follow up with her provider as well  Status of request form for in home care received, per message from patient's provider, form faxed to [employer] today Difference between home health and in home aid care clarified Active listening / Reflection utilized  Emotional Support Provided related to lack of social support Verbalization of feelings encouraged         Our next appointment is by telephone on 12/18/21 at 11:30am  Please call the care guide team at [phone] if you need to cancel or reschedule your appointment.   If you are experiencing a Mental Health or [place] or need someone to talk to, please call the Suicide and Crisis Lifeline: 988   Patient verbalizes understanding of instructions and care plan provided today and agrees to view in [place]. Active MyChart status and patient understanding of how to access instructions and care plan via MyChart confirmed with patient.     Telephone follow up appointment with care management team member scheduled for:12/18/21  Clariza Sickman, [place] Worker  [facility] Care Management [phone]

## 2021-12-13 ENCOUNTER — Encounter: Payer: Medicare Other | Admitting: Physical Therapy

## 2021-12-14 ENCOUNTER — Ambulatory Visit
Admission: RE | Admit: 2021-12-14 | Discharge: 2021-12-14 | Disposition: A | Discharge status: Home / Self Care | Payer: Medicare Other | Source: Ambulatory Visit | Attending: Student | Admitting: Student

## 2021-12-14 DIAGNOSIS — M25511 Pain in right shoulder: Secondary | ICD-10-CM

## 2021-12-18 ENCOUNTER — Encounter: Payer: Medicare Other | Admitting: Physical Therapy

## 2021-12-18 ENCOUNTER — Ambulatory Visit: Payer: Self-pay | Admitting: *Deleted

## 2021-12-18 NOTE — Patient Outreach (Signed)
  Care Coordination   Follow Up Visit Note   12/18/2021 Name: Sydney Evans MRN: [id number] DOB: October 26, 1950  Sydney Evans is a 71 y.o. year old female who sees Eugenia Pancoast, [place] for primary care. I spoke with  [name] by phone today  What matters to the patients health and wellness today? Additional hours of in home care assistnce    Goals Addressed               This Visit's Progress     I need additional in home care hours (pt-stated)        Care Coordination Interventions:  Patient confirmed that she continues to have in home care services 3 days a week for 2 hours through [place]. Patient requesting additional hours and would like to be re-assessed due to need for additional hours Patient states that she is having pain in her back, has been seem by her chiropractor. Plans to return-finds it beneficial. CSW suggested follow up with her provider as well  Status of request form for in home care received, collaboration phone call to [facility], request for in home assessment has been received and processed. They will be reaching out to patient by phone to schedule the in home assessment Active listening / Reflection utilized  Emotional Support Provided related to lack of social support Verbalization of feelings encouraged Reinforced need to expect call from [facility] regarding scheduling of in home assessment         SDOH assessments and interventions completed:  Yes     Care Coordination Interventions Activated:  Yes  Care Coordination Interventions:  Yes, provided   Follow up plan: Follow up call scheduled for 12/25/21 11:30am    Encounter Outcome:  Pt. Visit Completed

## 2021-12-18 NOTE — Patient Instructions (Signed)
Visit Information  Thank you for taking time to visit with me today. Please don't hesitate to contact me if I can be of assistance to you.   Following are the goals we discussed today:   Goals Addressed               This Visit's Progress     I need additional in home care hours (pt-stated)        Care Coordination Interventions:  Patient confirmed that she continues to have in home care services 3 days a week for 2 hours through [place]. Patient requesting additional hours and would like to be re-assessed due to need for additional hours Patient states that she is having pain in her back, has been seem by her chiropractor. Plans to return-finds it beneficial. CSW suggested follow up with her provider as well  Status of request form for in home care received, collaboration phone call to [facility], request for in home assessment has been received and processed. They will be reaching out to patient by phone to schedule the in home assessment Active listening / Reflection utilized  Emotional Support Provided related to lack of social support Verbalization of feelings encouraged Reinforced need to expect call from [facility] regarding scheduling of in home assessment         Our next appointment is by telephone on 12/25/21 at 11:30am  Please call the care guide team at [phone] if you need to cancel or reschedule your appointment.   If you are experiencing a Mental Health or [place] or need someone to talk to, please call the Suicide and Crisis Lifeline: 988   Patient verbalizes understanding of instructions and care plan provided today and agrees to view in [place]. Active MyChart status and patient understanding of how to access instructions and care plan via MyChart confirmed with patient.     Telephone follow up appointment with care management team member scheduled for: 12/25/21  Elliot Gurney, [place] Worker  [facility]  Care Management [phone]

## 2021-12-19 ENCOUNTER — Telehealth: Payer: Self-pay | Admitting: Family

## 2021-12-19 NOTE — Telephone Encounter (Signed)
  Encourage patient to contact the pharmacy for refills or they can request refills through [facility]  Did the patient contact the pharmacy:  N   LAST APPOINTMENT DATE:  Please schedule appointment if longer than 1 year  NEXT APPOINTMENT DATE:  MEDICATION: methocarbamol (ROBAXIN) 500 MG tablet  Is the patient out of medication? Y  If not, how much is left? none  Is this a 90 day supply:    PHARMACY:  [facility], [place] Phone:  [phone]  Fax:  [phone]      Patient states that her Surgeon, Dr. Clydene Laming stated to call him if there were any questions with filling this medication.  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

## 2021-12-20 ENCOUNTER — Encounter: Payer: Medicare Other | Admitting: Physical Therapy

## 2021-12-20 ENCOUNTER — Other Ambulatory Visit: Payer: Self-pay

## 2021-12-20 DIAGNOSIS — M62838 Other muscle spasm: Secondary | ICD-10-CM

## 2021-12-21 ENCOUNTER — Other Ambulatory Visit: Payer: Self-pay | Admitting: Family

## 2021-12-21 DIAGNOSIS — M62838 Other muscle spasm: Secondary | ICD-10-CM

## 2021-12-21 MED ORDER — METHOCARBAMOL 500 MG PO TABS: 500.0000 mg | ORAL_TABLET | Freq: Two times a day (BID) | ORAL | 0 refills | Status: AC | PRN

## 2021-12-21 NOTE — Telephone Encounter (Signed)
This has been rectified sent in Rx.

## 2021-12-21 NOTE — Telephone Encounter (Signed)
Called and informed pt that rx was sent to pharmacy.

## 2021-12-25 ENCOUNTER — Ambulatory Visit: Payer: Self-pay | Admitting: *Deleted

## 2021-12-25 ENCOUNTER — Encounter: Payer: Medicare Other | Admitting: Physical Therapy

## 2021-12-25 NOTE — Patient Instructions (Signed)
Visit Information  Thank you for taking time to visit with me today. Please don't hesitate to contact me if I can be of assistance to you.   Following are the goals we discussed today:   Goals Addressed               This Visit's Progress     I need additional in home care hours (pt-stated)        Care Coordination Interventions:  Patient confirmed that she continues to have in home care services 3 days a week for 2 hours through [place]. Patient requesting additional hours and would like to be re-assessed due to need for additional hours  Confirmed that patient's home health agency has availability for additional hours Confirmed that [facility] has not contacted her  to schedule the in home assessment Collaboration phone call to [facility] to be made for status on inpatient assessment Will consider referral for the Community Alternatives Program Active listening / Reflection utilized  Emotional Support Provided related to lack of social support Verbalization of feelings encouraged Reinforced need to expect call from [facility] regarding scheduling of in home assessment         Our next appointment is by telephone on 12/26/21 at 1pm  Please call the care guide team at [phone] if you need to cancel or reschedule your appointment.   If you are experiencing a Mental Health or [place] or need someone to talk to, please call the Suicide and Crisis Lifeline: 988   Patient verbalizes understanding of instructions and care plan provided today and agrees to view in [place]. Active MyChart status and patient understanding of how to access instructions and care plan via MyChart confirmed with patient.     Telephone follow up appointment with care management team member scheduled for: 12/26/21 1pm  Ramonia Mcclaran, [place] Worker  [facility] Care Management [phone]

## 2021-12-25 NOTE — Patient Outreach (Signed)
  Care Coordination   Follow Up Visit Note   12/25/2021 Name: Sydney Evans MRN: [id number] DOB: 11/28/1950  Sydney Evans is a 71 y.o. year old female who sees Eugenia Pancoast, [place] for primary care. I spoke with  [name] by phone today  What matters to the patients health and wellness today?  Additional in home care hours    Goals Addressed               This Visit's Progress     I need additional in home care hours (pt-stated)        Care Coordination Interventions:  Patient confirmed that she continues to have in home care services 3 days a week for 2 hours through [place]. Patient requesting additional hours and would like to be re-assessed due to need for additional hours  Confirmed that patient's home health agency has availability for additional hours Confirmed that [facility] has not contacted her  to schedule the in home assessment Collaboration phone call to [facility] to be made for status on inpatient assessment Will consider referral for the Community Alternatives Program Active listening / Reflection utilized  Emotional Support Provided related to lack of social support Verbalization of feelings encouraged Reinforced need to expect call from [facility] regarding scheduling of in home assessment         SDOH assessments and interventions completed:  Yes     Care Coordination Interventions Activated:  Yes  Care Coordination Interventions:  Yes, provided   Follow up plan: Follow up call scheduled for 12/26/21  1:30pm  Encounter Outcome:  Pt. Visit Completed

## 2021-12-26 ENCOUNTER — Ambulatory Visit: Payer: Self-pay | Admitting: *Deleted

## 2021-12-26 ENCOUNTER — Telehealth: Payer: Self-pay | Admitting: Family

## 2021-12-26 NOTE — Telephone Encounter (Signed)
Patient called and said she had received a call from someone named Crystal. I dont see any messages in where someone call. Patient would like a call back at [phone]

## 2021-12-27 ENCOUNTER — Encounter: Payer: Medicare Other | Admitting: Physical Therapy

## 2021-12-27 NOTE — Telephone Encounter (Signed)
I called and informed pt that Crystal from care coordinator and she stated that she spoke to her already.

## 2021-12-27 NOTE — Patient Outreach (Signed)
  Care Coordination   Follow Up Visit Note   12/27/2021 Name: Sydney Evans MRN: [id number] DOB: 06-Sep-1950  Sydney Evans is a 71 y.o. year old female who sees Sydney Evans, [place] for primary care. I spoke with  Sydney Evans by phone today  What matters to the patients health and wellness today?  Additional in home care hours    Goals Addressed               This Visit's Progress     I need additional in home care hours (pt-stated)        Care Coordination Interventions:  Patient confirmed that she continues to have in home care services 3 days a week for 2 hours through [place] [facility]. Patient requesting additional hours and would like to be re-assessed due to need for additional hours  Confirmed that patient's home health agency has availability for additional hours [place] care and Confirmed that patient completed the in home assessment on 12/21/21, additional hours approval will be confirmed by mail Phone call to patient who confirmed nursing assessment was completed on 12/21/21 and they agreed with need for additional in home care hours Will continue to consider referral for the [facility] Active listening / Reflection utilized  Emotional Support Provided related to lack of social support Verbalization of feelings encouraged          SDOH assessments and interventions completed:  Yes     Care Coordination Interventions Activated:  Yes  Care Coordination Interventions:  Yes, provided   Follow up plan: Follow up call scheduled for 01/03/22    Encounter Outcome:  Pt. Visit Completed

## 2021-12-27 NOTE — Patient Instructions (Signed)
Visit Information  Thank you for taking time to visit with me today. Please don't hesitate to contact me if I can be of assistance to you.   Following are the goals we discussed today:   Goals Addressed               This Visit's Progress     I need additional in home care hours (pt-stated)        Care Coordination Interventions:  Patient confirmed that she continues to have in home care services 3 days a week for 2 hours through [place]. Patient requesting additional hours and would like to be re-assessed due to need for additional hours  Confirmed that patient's home health agency has availability for additional hours [place] care and Confirmed that patient completed the in home assessment on 12/21/21, additional hours approval will be confirmed by mail Phone call to patient who confirmed nursing assessment was completed on 12/21/21 and they agreed with need for additional in home care hours Will continue to consider referral for the [facility] Active listening / Reflection utilized  Emotional Support Provided related to lack of social support Verbalization of feelings encouraged          Our next appointment is by telephone on 01/03/22 at 11:30am  Please call the care guide team at [phone] if you need to cancel or reschedule your appointment.   If you are experiencing a Mental Health or [place] or need someone to talk to, please call the Suicide and Crisis Lifeline: 988   Patient verbalizes understanding of instructions and care plan provided today and agrees to view in [place]. Active MyChart status and patient understanding of how to access instructions and care plan via MyChart confirmed with patient.     Telephone follow up appointment with care management team member scheduled for: 01/03/22  Elliot Gurney, [place] Worker  [facility] Care Management [phone]

## 2022-01-01 ENCOUNTER — Encounter: Payer: Medicare Other | Admitting: Physical Therapy

## 2022-01-03 ENCOUNTER — Ambulatory Visit: Payer: Self-pay | Admitting: *Deleted

## 2022-01-03 ENCOUNTER — Encounter: Payer: Medicare Other | Admitting: Physical Therapy

## 2022-01-03 NOTE — Patient Outreach (Signed)
  Care Coordination   Follow Up Visit Note   01/03/2022 Name: Sydney Evans MRN: [id number] DOB: June 30, 1950  Sydney Evans is a 71 y.o. year old female who sees Sydney Evans, [place] for primary care. I spoke with  Sydney Evans by phone today.  What matters to the patients health and wellness today?  Additional Hours for in home care    Goals Addressed               This Visit's Progress     I need additional in home care hours (pt-stated)        Care Coordination Interventions:  Patient confirmed that she continues to have in home care services 3 days a week for 2 hours through [place]. Patient requesting additional hours and would like to be re-assessed due to need for additional hours  Confirmed that patient's home health agency has availability for additional hours and in home assessment was completed on 12/21/21-patient told verbally that the additional hours have been approved, however no start date or written approval obtained Phone call to [facility] [phone] to confirm additional hours, VM left for a return call Patient continues to report tightness in neck and shoulders, continues to follow up with her chiropractor Will continue to consider referral for the [facility] Active listening / Reflection utilized  Emotional Support Provided related to lack of social support Verbalization of feelings encouraged          SDOH assessments and interventions completed:  Yes     Care Coordination Interventions Activated:  Yes  Care Coordination Interventions:  Yes, provided   Follow up plan: Follow up call scheduled for 01/15/22    Encounter Outcome:  Pt. Visit Completed

## 2022-01-03 NOTE — Patient Instructions (Signed)
Visit Information  Thank you for taking time to visit with me today. Please don't hesitate to contact me if I can be of assistance to you.   Following are the goals we discussed today:   Goals Addressed               This Visit's Progress     I need additional in home care hours (pt-stated)        Care Coordination Interventions:  Patient confirmed that she continues to have in home care services 3 days a week for 2 hours through [place]. Patient requesting additional hours and would like to be re-assessed due to need for additional hours  Confirmed that patient's home health agency has availability for additional hours and in home assessment was completed on 12/21/21-patient told verbally that the additional hours have been approved, however no start date or written approval obtained Phone call to [facility] [phone] to confirm additional hours, VM left for a return call Patient continues to report tightness in neck and shoulders, continues to follow up with her chiropractor Will continue to consider referral for the [facility] Active listening / Reflection utilized  Emotional Support Provided related to lack of social support Verbalization of feelings encouraged          Our next appointment is by telephone on 01/09/22 at 10:30am  Please call the care guide team at [phone] if you need to cancel or reschedule your appointment.   If you are experiencing a Mental Health or [place] or need someone to talk to, please call the Suicide and Crisis Lifeline: 988   Patient verbalizes understanding of instructions and care plan provided today and agrees to view in [place]. Active MyChart status and patient understanding of how to access instructions and care plan via MyChart confirmed with patient.     Telephone follow up appointment with care management team member scheduled for: 01/09/22  Elliot Gurney, [place] Worker  [facility] Care Management [phone]

## 2022-01-04 ENCOUNTER — Telehealth: Payer: Self-pay | Admitting: Family

## 2022-01-04 NOTE — Telephone Encounter (Signed)
Melissa from [facility] called in and stated patient missed OT appointment. Patient is out of town this week. Thank you!

## 2022-01-11 ENCOUNTER — Ambulatory Visit: Payer: Medicare Other

## 2022-01-15 ENCOUNTER — Ambulatory Visit: Payer: Self-pay | Admitting: *Deleted

## 2022-01-15 NOTE — Patient Outreach (Signed)
  Care Coordination   Follow Up Visit Note   01/15/2022 Name: Sydney Evans MRN: [id number] DOB: Sep 27, 1950  Sydney Evans is a 71 y.o. year old female who sees Eugenia Pancoast, [place] for primary care. I spoke with  [name] by phone today.  What matters to the patients health and wellness today?  In home care needs    Goals Addressed               This Visit's Progress     I need additional in home care hours (pt-stated)        Care Coordination Interventions:  Patient confirmed that she now has extended personal care hours 4 hours Monday-Friday, 2 hours on Saturday, 1 hour on Sunday Patient very grateful for the additional hours and finds it very beneficial  Patient discussed concern that she is losing strength in her hands and arms, she continues to work with PT/OT in the home-patient also considering joining the [employer] to use their pool Active listening / Reflection utilized  Needs assessment completed for additional community resource needs Patient verbalized having no additional needs at this time         SDOH assessments and interventions completed:  No     Care Coordination Interventions Activated:  Yes  Care Coordination Interventions:  Yes, provided   Follow up plan: No further intervention required.   Encounter Outcome:  Pt. Visit Completed

## 2022-01-15 NOTE — Patient Instructions (Signed)
Visit Information  Thank you for taking time to visit with me today. Please don't hesitate to contact me if I can be of assistance to you.   Following are the goals we discussed today:   Goals Addressed               This Visit's Progress     I need additional in home care hours (pt-stated)        Care Coordination Interventions:  Patient confirmed that she now has extended personal care hours 4 hours Monday-Friday, 2 hours on Saturday, 1 hour on Sunday Patient very grateful for the additional hours and finds it very beneficial  Patient discussed concern that she is losing strength in her hands and arms, she continues to work with PT/OT in the home-patient also considering joining the [employer] to use their pool Active listening / Reflection utilized  Needs assessment completed for additional community resource needs Patient verbalized having no additional needs at this time        Please call the care guide team at [phone] if you need to cancel or reschedule your appointment.   If you are experiencing a Mental Health or [place] or need someone to talk to, please call the Suicide and Crisis Lifeline: 988 call 911   Patient verbalizes understanding of instructions and care plan provided today and agrees to view in [place]. Active MyChart status and patient understanding of how to access instructions and care plan via MyChart confirmed with patient.     No further follow up required: patient to contact this Education officer, museum with any additional community resource needs  [employer], [place] Worker  [facility] Care Management [phone]

## 2022-01-18 ENCOUNTER — Emergency Department
Admission: EM | Admit: 2022-01-18 | Discharge: 2022-01-19 | Disposition: A | Discharge status: Home / Self Care | Payer: Medicare Other | Attending: Emergency Medicine | Admitting: Emergency Medicine

## 2022-01-18 ENCOUNTER — Other Ambulatory Visit: Payer: Self-pay

## 2022-01-18 ENCOUNTER — Emergency Department: Payer: Medicare Other

## 2022-01-18 DIAGNOSIS — I2510 Atherosclerotic heart disease of native coronary artery without angina pectoris: Secondary | ICD-10-CM | POA: Diagnosis not present

## 2022-01-18 DIAGNOSIS — I10 Essential (primary) hypertension: Secondary | ICD-10-CM | POA: Insufficient documentation

## 2022-01-18 DIAGNOSIS — M542 Cervicalgia: Secondary | ICD-10-CM | POA: Diagnosis present

## 2022-01-18 DIAGNOSIS — I6521 Occlusion and stenosis of right carotid artery: Secondary | ICD-10-CM

## 2022-01-18 DIAGNOSIS — M546 Pain in thoracic spine: Secondary | ICD-10-CM | POA: Insufficient documentation

## 2022-01-18 DIAGNOSIS — D649 Anemia, unspecified: Secondary | ICD-10-CM | POA: Insufficient documentation

## 2022-01-18 DIAGNOSIS — R0789 Other chest pain: Secondary | ICD-10-CM

## 2022-01-18 LAB — CBC
HCT: 32.5 % — ABNORMAL LOW (ref 36.0–46.0)
Hemoglobin: 9.9 g/dL — ABNORMAL LOW (ref 12.0–15.0)
MCH: 24.4 pg — ABNORMAL LOW (ref 26.0–34.0)
MCHC: 30.5 g/dL (ref 30.0–36.0)
MCV: 80 fL (ref 80.0–100.0)
Platelets: 397 10*3/uL (ref 150–400)
RBC: 4.06 MIL/uL (ref 3.87–5.11)
RDW: 16 % — ABNORMAL HIGH (ref 11.5–15.5)
WBC: 5.7 10*3/uL (ref 4.0–10.5)
nRBC: 0 % (ref 0.0–0.2)

## 2022-01-18 LAB — TROPONIN I (HIGH SENSITIVITY)
Troponin I (High Sensitivity): 8 ng/L (ref <18)
Troponin I (High Sensitivity): 7 ng/L (ref <18)

## 2022-01-18 LAB — BASIC METABOLIC PANEL
Anion gap: 6 (ref 5–15)
BUN: 13 mg/dL (ref 8–23)
CO2: 25 mmol/L (ref 22–32)
Calcium: 8.9 mg/dL (ref 8.9–10.3)
Chloride: 107 mmol/L (ref 98–111)
Creatinine, Ser: 0.78 mg/dL (ref 0.44–1.00)
GFR, Estimated: >60 mL/min (ref >60)
Glucose, Bld: 77 mg/dL (ref 70–99)
Potassium: 4 mmol/L (ref 3.5–5.1)
Sodium: 138 mmol/L (ref 135–145)

## 2022-01-18 LAB — CK: Total CK: 78 U/L (ref 38–234)

## 2022-01-18 MED ORDER — IBUPROFEN 600 MG PO TABS
600.0000 mg | ORAL_TABLET | Freq: Once | ORAL | Status: AC
  Administered 2022-01-18: 600 mg via ORAL
  Filled 2022-01-18: qty 1

## 2022-01-18 MED ORDER — METHOCARBAMOL 500 MG PO TABS
500.0000 mg | ORAL_TABLET | Freq: Once | ORAL | Status: AC
  Administered 2022-01-18: 500 mg via ORAL
  Filled 2022-01-18: qty 1

## 2022-01-18 MED ORDER — GABAPENTIN 300 MG PO CAPS
300.0000 mg | ORAL_CAPSULE | ORAL | Status: AC
Start: 2022-01-18 — End: 2022-01-18
  Administered 2022-01-18: 300 mg via ORAL
  Filled 2022-01-18: qty 1

## 2022-01-18 MED ORDER — IOHEXOL 350 MG/ML SOLN
125.0000 mL | Freq: Once | INTRAVENOUS | Status: AC | PRN
  Administered 2022-01-18: 125 mL via INTRAVENOUS

## 2022-01-18 NOTE — Telephone Encounter (Signed)
Called and spoke to pt about referral

## 2022-01-18 NOTE — ED Provider Notes (Signed)
[facility] Provider Note    Event Date/Time   First MD Initiated Contact with Patient 01/18/22 1750     (approximate)   History   Extremity Pain   HPI  Sydney Evans is a 71 y.o. female history of anxiety, bipolar disorder, carotid arterial disease, hypertension  Patient reports that she woke up at 6 AM with excruciating pain in the back of her neck, kind of her middle lower neck.  Pain radiated out into both of her arms felt tingly sensation with that, and extreme pain causing her to yell and scream in pain.  It seemed to radiate pain up towards the base of her skull and some into her face, and also felt like a tight feeling in the muscles of her upper back and lower neck.  She reports that she has had neck pain and has been seeing specialist for, but woke this morning with an excruciating severe pain running down both arms  The pain has now almost completely resolved.  She reports she had no chest pain.  There is no headache but the pain was so severe it felt like it was radiating pain out towards her face at one point.  No fevers or chills.  Denies recent illness.  No abdominal pain.  Recently patient has also been seeing orthopedics for neck pain.  She underwent an MRI recently.     Physical Exam   Triage Vital Signs: ED Triage Vitals  Enc Vitals Group     BP 01/18/22 1504 103/82     Pulse Rate 01/18/22 1504 (!) 55     Resp 01/18/22 1504 (!) 21     Temp 01/18/22 1504 98.4 F (36.9 C)     Temp Source 01/18/22 1504 Oral     SpO2 01/18/22 1504 99 %     Weight --      Height --      Head Circumference --      Peak Flow --      Pain Score 01/18/22 1449 10     Pain Loc --      Pain Edu? --      Excl. in [place]? --     Most recent vital signs: Vitals:   01/18/22 1930 01/18/22 2234  BP: (!) 154/55   Pulse: (!) 51   Resp:    Temp:  98.7 F (37.1 C)  SpO2: 99%      General: Awake, no distress.  Resting quite comfortably. Reports moderate  lower cervical midline tenderness, and moderate upper thoracic tenderness without step-off deformity or overlying lesion. 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  Warm well-perfused and upper extremities bilaterally with strong pulses.  Capillary refill normal in the lower extremity.  She is has excellent squeeze, normal sensation in the hands bilaterally.  Of note though she reports her pain was quite severe in her neck and radiating down into her arms but is now completely resolved. CV:  Good peripheral perfusion.  Normal heart tones.  Regular rate and rhythm Resp:  Normal effort.  Clear bilaterally Abd:  No distention.  Soft nontender nondistended Other:     ED Results / Procedures / Treatments   Labs (all labs ordered are listed, but only abnormal results are displayed) Labs Reviewed  CBC - Abnormal; Notable for the following components:      Result Value   Hemoglobin 9.9 (*)  HCT 32.5 (*)    MCH 24.4 (*)    RDW 16.0 (*)    All other components within normal limits  BASIC METABOLIC PANEL  CK  TROPONIN I (HIGH SENSITIVITY)  TROPONIN I (HIGH SENSITIVITY)  TROPONIN I (HIGH SENSITIVITY)     EKG  And interpreted by me at 1455 heart rate 50 QRS 70 QTc 430 Sinus bradycardia, no evidence of acute ischemia denoted.  Probable old septal Q wave.  No noted abnormalities in comparison with previous EKG   RADIOLOGY  Chest x-ray interpreted as normal  MR Cervical Spine Wo Contrast  Result Date: 01/18/2022 CLINICAL DATA:  Acute neck pain EXAM: MRI CERVICAL AND THORACIC SPINE WITHOUT CONTRAST TECHNIQUE: Multiplanar and multiecho pulse sequences of the cervical spine, to include the craniocervical junction and cervicothoracic junction, and the thoracic spine, were obtained without intravenous contrast. COMPARISON:  12/14/2021 FINDINGS: MRI CERVICAL SPINE FINDINGS Alignment: Grade 1 anterolisthesis at C4-5  Vertebrae: No fracture, evidence of discitis, or bone lesion. Cord: Normal signal and morphology. Posterior Fossa, vertebral arteries, paraspinal tissues: Negative Disc levels: C1-2: Unremarkable. C2-3: Normal disc space and facet joints. There is no spinal canal stenosis. No neural foraminal stenosis. C3-4: Normal disc space and facet joints. There is no spinal canal stenosis. No neural foraminal stenosis. C4-5: Small disc bulge with endplate spurring. There is no spinal canal stenosis. No neural foraminal stenosis. C5-6: Small disc bulge with left facet hypertrophy, unchanged. There is no spinal canal stenosis. No neural foraminal stenosis. C6-7: Normal disc space and facet joints. There is no spinal canal stenosis. No neural foraminal stenosis. C7-T1: Disc height loss with minimal bulge. There is no spinal canal stenosis. No neural foraminal stenosis. MRI THORACIC SPINE FINDINGS Alignment:  Physiologic. Vertebrae: Vertebral augmentation at T7-T10 no acute abnormality. Cord:  Normal signal and morphology. Paraspinal and other soft tissues: Negative Disc levels: No thoracic spinal canal stenosis. There is a medium-sized left subarticular disc protrusion at T11-12. IMPRESSION: 1. No acute abnormality of the cervical or thoracic spine. 2. 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 3. Medium-sized left subarticular disc protrusion at T11-12 without associated stenosis. Electronically Signed   By: Ulyses Jarred M.D.   On: 01/18/2022 23:03   MR THORACIC SPINE WO CONTRAST  Result Date: 01/18/2022 CLINICAL DATA:  Acute neck pain EXAM: MRI CERVICAL AND THORACIC SPINE WITHOUT CONTRAST TECHNIQUE: Multiplanar and multiecho pulse sequences of the cervical spine, to include the craniocervical junction and cervicothoracic junction, and the thoracic spine, were obtained without intravenous contrast. COMPARISON:  12/14/2021 FINDINGS: MRI CERVICAL SPINE  FINDINGS Alignment: Grade 1 anterolisthesis at C4-5 Vertebrae: No fracture, evidence of discitis, or bone lesion. Cord: Normal signal and morphology. Posterior Fossa, vertebral arteries, paraspinal tissues: Negative Disc levels: C1-2: Unremarkable. C2-3: Normal disc space and facet joints. There is no spinal canal stenosis. No neural foraminal stenosis. C3-4: Normal disc space and facet joints. There is no spinal canal stenosis. No neural foraminal stenosis. C4-5: Small disc bulge with endplate spurring. There is no spinal canal stenosis. No neural foraminal stenosis. C5-6: Small disc bulge with left facet hypertrophy, unchanged. There is no spinal canal stenosis. No neural foraminal stenosis. C6-7: Normal disc space and facet joints. There is no spinal canal stenosis. No neural foraminal stenosis. C7-T1: Disc height loss with minimal bulge. There is no spinal canal stenosis. No neural foraminal stenosis. MRI THORACIC SPINE FINDINGS Alignment:  Physiologic. Vertebrae: Vertebral augmentation at T7-T10 no acute abnormality. Cord:  Normal  signal and morphology. Paraspinal and other soft tissues: Negative Disc levels: No thoracic spinal canal stenosis. There is a medium-sized left subarticular disc protrusion at T11-12. IMPRESSION: 1. No acute abnormality of the cervical or thoracic spine. 2. 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 3. Medium-sized left subarticular disc protrusion at T11-12 without associated stenosis. Electronically Signed   By: Ulyses Jarred M.D.   On: 01/18/2022 23:03   DG Chest 2 View  Result Date: 01/18/2022 CLINICAL DATA:  Chest pain EXAM: CHEST - 2 VIEW COMPARISON:  11/27/2021 FINDINGS: Bilateral shoulder replacements. No acute airspace disease. Borderline cardiomegaly. No pneumothorax. Multiple treated compression deformities of the thoracic spine. IMPRESSION: No active cardiopulmonary disease. Electronically Signed    By: Donavan Foil M.D.   On: 01/18/2022 15:48    MRI of the cervical and thoracic spine ordered given the patient's presentation and clinical history, wish to exclude acute central spinal etiology such as acutely herniated nucleus pulposus, compression fracture etc.    MR Cervical Spine Wo Contrast  Result Date: 01/18/2022 CLINICAL DATA:  Acute neck pain EXAM: MRI CERVICAL AND THORACIC SPINE WITHOUT CONTRAST TECHNIQUE: Multiplanar and multiecho pulse sequences of the cervical spine, to include the craniocervical junction and cervicothoracic junction, and the thoracic spine, were obtained without intravenous contrast. COMPARISON:  12/14/2021 FINDINGS: MRI CERVICAL SPINE FINDINGS Alignment: Grade 1 anterolisthesis at C4-5 Vertebrae: No fracture, evidence of discitis, or bone lesion. Cord: Normal signal and morphology. Posterior Fossa, vertebral arteries, paraspinal tissues: Negative Disc levels: C1-2: Unremarkable. C2-3: Normal disc space and facet joints. There is no spinal canal stenosis. No neural foraminal stenosis. C3-4: Normal disc space and facet joints. There is no spinal canal stenosis. No neural foraminal stenosis. C4-5: Small disc bulge with endplate spurring. There is no spinal canal stenosis. No neural foraminal stenosis. C5-6: Small disc bulge with left facet hypertrophy, unchanged. There is no spinal canal stenosis. No neural foraminal stenosis. C6-7: Normal disc space and facet joints. There is no spinal canal stenosis. No neural foraminal stenosis. C7-T1: Disc height loss with minimal bulge. There is no spinal canal stenosis. No neural foraminal stenosis. MRI THORACIC SPINE FINDINGS Alignment:  Physiologic. Vertebrae: Vertebral augmentation at T7-T10 no acute abnormality. Cord:  Normal signal and morphology. Paraspinal and other soft tissues: Negative Disc levels: No thoracic spinal canal stenosis. There is a medium-sized left subarticular disc protrusion at T11-12. IMPRESSION: 1. No acute  abnormality of the cervical or thoracic spine. 2. 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 3. Medium-sized left subarticular disc protrusion at T11-12 without associated stenosis. Electronically Signed   By: Ulyses Jarred M.D.   On: 01/18/2022 23:03   MR THORACIC SPINE WO CONTRAST  Result Date: 01/18/2022 CLINICAL DATA:  Acute neck pain EXAM: MRI CERVICAL AND THORACIC SPINE WITHOUT CONTRAST TECHNIQUE: Multiplanar and multiecho pulse sequences of the cervical spine, to include the craniocervical junction and cervicothoracic junction, and the thoracic spine, were obtained without intravenous contrast. COMPARISON:  12/14/2021 FINDINGS: MRI CERVICAL SPINE FINDINGS Alignment: Grade 1 anterolisthesis at C4-5 Vertebrae: No fracture, evidence of discitis, or bone lesion. Cord: Normal signal and morphology. Posterior Fossa, vertebral arteries, paraspinal tissues: Negative Disc levels: C1-2: Unremarkable. C2-3: Normal disc space and facet joints. There is no spinal canal stenosis. No neural foraminal stenosis. C3-4: Normal disc space and facet joints. There is no spinal canal stenosis. No neural foraminal stenosis. C4-5: Small disc bulge with endplate spurring. There is  no spinal canal stenosis. No neural foraminal stenosis. C5-6: Small disc bulge with left facet hypertrophy, unchanged. There is no spinal canal stenosis. No neural foraminal stenosis. C6-7: Normal disc space and facet joints. There is no spinal canal stenosis. No neural foraminal stenosis. C7-T1: Disc height loss with minimal bulge. There is no spinal canal stenosis. No neural foraminal stenosis. MRI THORACIC SPINE FINDINGS Alignment:  Physiologic. Vertebrae: Vertebral augmentation at T7-T10 no acute abnormality. Cord:  Normal signal and morphology. Paraspinal and other soft tissues: Negative Disc levels: No thoracic spinal canal stenosis. There is a medium-sized left subarticular disc  protrusion at T11-12. IMPRESSION: 1. No acute abnormality of the cervical or thoracic spine. 2. 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 3. Medium-sized left subarticular disc protrusion at T11-12 without associated stenosis. Electronically Signed   By: Ulyses Jarred M.D.   On: 01/18/2022 23:03   DG Chest 2 View  Result Date: 01/18/2022 CLINICAL DATA:  Chest pain EXAM: CHEST - 2 VIEW COMPARISON:  11/27/2021 FINDINGS: Bilateral shoulder replacements. No acute airspace disease. Borderline cardiomegaly. No pneumothorax. Multiple treated compression deformities of the thoracic spine. IMPRESSION: No active cardiopulmonary disease. Electronically Signed   By: Donavan Foil M.D.   On: 01/18/2022 15:48    MRI results reviewed, read as negative for acute abnormalities.  Multilevel degenerative cervicals findings.  Also noted is a medium size left subarticular disc protrusion at T11-T12.   PROCEDURES:  Critical Care performed: No  Procedures   MEDICATIONS ORDERED IN ED: Medications  methocarbamol (ROBAXIN) tablet 500 mg (has no administration in time range)  gabapentin (NEURONTIN) capsule 300 mg (300 mg Oral Given 01/18/22 2321)  ibuprofen (ADVIL) tablet 600 mg (600 mg Oral Given 01/18/22 2321)     IMPRESSION / MDM / ASSESSMENT AND PLAN / ED COURSE  I reviewed the triage vital signs and the nursing notes.                              Differential diagnosis includes, but is not limited to, acute spinal etiology such as herniated nucleus pulposus, fracture, compression injury, radiculopathy which all seem quite high on the differential given the presentation of neck pain with radiation.  Her symptoms now resolved.  Symptoms started at 6 AM, her troponin is quite normal after several hours from the time of her episode.  Her EKG reassuring without evidence of ischemia she denies shortness of breath or chest pain.  Very reassuring  neurologic exam at this time  CBC interpreted as mild chronic anemia.  Normal troponin normal CK.  Normal metabolic panel.  Patient's presentation is most consistent with acute presentation with potential threat to life or bodily function.  The patient is on the cardiac monitor to evaluate for evidence of arrhythmia and/or significant heart rate changes.     ----------------------------------------- 10:56 PM on 01/18/2022 ----------------------------------------- Patient resting, awaiting MRI result.  Reports pain is starting to come back it is located in her lower neck and radiating out slightly towards her right upper back towards her shoulder.  Will give dose of gabapentin, methocarbamol, and ibuprofen.  Patient agreeable with this.  Ongoing care assigned to my oncoming partner Dr. Leonides Schanz.  Current plan is for follow-up on results of patient's MRIs.  If MRI of the cervical and thoracic spine show demonstrable lesion that would explain her pain, would consult with neurosurgery as needed based on results.  However, if  no explainable findings on the MRI, would consider in light of the symptoms CT chest and CTA neck with contrast to evaluate for etiologies such as acute arterial or aortic type pathology or dissection though this is much lower on my differential and I would consider this only if the patient did not have a plausible explanation noted on her imaging and MRIs.  Patient to agreeable with plan  ----------------------------------------- 11:25 PM on 01/18/2022 -----------------------------------------  MRI without obvious evidence of acute finding that is of clear causation, however given the patient's symptoms are much improved from this morning still suspicious cervical radiculopathy or possible herniated nucleus pulposus could be a cause.  However given the patient's history of atherosclerotic disease, discussed with patient and we will proceed with CT angiography of the chest and neck  to evaluate for acute aortic or carotid pathologies.  Also repeat troponin.  Discussed this update with Dr. Leonides Schanz, will be following up on CT imaging results and troponin #2  FINAL CLINICAL IMPRESSION(S) / ED DIAGNOSES   Final diagnoses:  Acute neck pain     Rx / DC Orders   ED Discharge Orders     None        Note:  This document was prepared using Dragon voice recognition software and may include unintentional dictation errors.   Delman Kitten, MD 01/18/22 [phone]

## 2022-01-18 NOTE — ED Notes (Signed)
Pt stated she just aches right now.

## 2022-01-18 NOTE — ED Triage Notes (Signed)
Pt c/o severe pain from hands up arms, back, shoulders, neck and shooting into head. Pt states she feels like her "eyes are going to pop out," and her ""skin is going to burst open." Pt is AOX4, crying tearlessly intermittently. Pt states pain started this am without trauma and has never happened before. Pt took gabapentin and ibuprofen without relief. Pt states it hurts to move her upper extremities. CMS intact, skin warm and dry, capillary refill <3 seconds.

## 2022-01-18 NOTE — Telephone Encounter (Signed)
Pt called in requesting a call back [phone]

## 2022-01-18 NOTE — ED Provider Notes (Signed)
11:15 PM  Assumed care at shift change.  Patient here with neck and chest pain.  MRIs unremarkable of the cervical and thoracic spine.  CTA head and neck, chest pending for further evaluation.  Second troponin pending.   12:54 AM CTA is reviewed and interpreted by myself and the radiologist and show no acute abnormality.  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  On review of her records she had occlusion at the base of the internal carotid artery in 2017.  She presented today with no stroke symptoms and only pain.  I do not think this is the cause of her symptoms today.  She is on aspirin which I have recommended she continue but she is not on any other antiplatelet.  We will have her follow-up closely with her primary care provider as well as vascular surgery as an outpatient but I do not feel she needs emergent intervention today.  She has been sleeping comfortably and is feeling much better, smiling.  Discussed return precautions.  Will discharge home with family.   At this time, I do not feel there is any life-threatening condition present. I reviewed all nursing notes, vitals, pertinent previous records.  All lab and urine results, EKGs, imaging ordered have been independently reviewed and interpreted by myself.  I reviewed all available radiology reports from any imaging ordered this visit.  Based on my assessment, I feel the patient is safe to be discharged home without further emergent workup and can continue workup as an outpatient as needed. Discussed all findings, treatment plan as well as usual and customary return precautions.  They verbalize understanding and are comfortable with this plan.  Outpatient follow-up has been provided as needed.  All questions have been answered.    Tamaira Ciriello, Delice Bison, DO 01/19/22 [phone]

## 2022-01-19 NOTE — Discharge Instructions (Addendum)
Please continue your aspirin as prescribed.  I recommend very close follow-up with vascular surgery and your primary care doctor.  I do not think that the occluded carotid artery is what caused your pain today and the radiologist and I both favor that this is chronic in nature.  Your doctor may want to start you back on other antiplatelet medications such as Plavix or Aggrenox but you will need close vascular surgery follow-up to see if you need intervention such as a carotid endarterectomy.  The rest of your work-up today has been reassuring.  MRI show no neurosurgical emergency.  Lab work is normal including 2 negative cardiac labs.

## 2022-01-25 ENCOUNTER — Ambulatory Visit: Payer: Medicare Other | Admitting: Family

## 2022-01-28 ENCOUNTER — Ambulatory Visit: Payer: Medicare Other | Admitting: Family

## 2022-01-29 ENCOUNTER — Encounter (INDEPENDENT_AMBULATORY_CARE_PROVIDER_SITE_OTHER): Payer: Self-pay | Admitting: Nurse Practitioner

## 2022-01-29 ENCOUNTER — Ambulatory Visit (INDEPENDENT_AMBULATORY_CARE_PROVIDER_SITE_OTHER): Payer: Medicare Other | Admitting: Nurse Practitioner

## 2022-01-29 ENCOUNTER — Encounter (INDEPENDENT_AMBULATORY_CARE_PROVIDER_SITE_OTHER): Payer: Medicare Other | Admitting: Nurse Practitioner

## 2022-01-29 VITALS — BP 144/66 | HR 52 | Resp 19

## 2022-01-29 DIAGNOSIS — M47812 Spondylosis without myelopathy or radiculopathy, cervical region: Secondary | ICD-10-CM

## 2022-01-29 DIAGNOSIS — I6523 Occlusion and stenosis of bilateral carotid arteries: Secondary | ICD-10-CM

## 2022-01-29 DIAGNOSIS — I70221 Atherosclerosis of native arteries of extremities with rest pain, right leg: Secondary | ICD-10-CM | POA: Diagnosis not present

## 2022-01-30 ENCOUNTER — Encounter (INDEPENDENT_AMBULATORY_CARE_PROVIDER_SITE_OTHER): Payer: Self-pay | Admitting: Nurse Practitioner

## 2022-01-30 NOTE — Progress Notes (Signed)
Subjective:    Patient ID: Sydney Evans, female    DOB: [birth year], 71 y.o.   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  The patient initially presented to [facility] for severe neck pain.  The pain occurred in her lower neck and radiated down through her arms with a numbing tingling sensation.  This was severe for the patient.  Work-up at [facility] did not find any obvious acute causes for the pain although it is felt to be musculoskeletal in nature.  CT angiogram of the neck reveals an occluded right ICA with an approximate 50% or less stenosis.    Review of Systems  Musculoskeletal:  Positive for arthralgias, back pain, gait problem and joint swelling.  All other systems reviewed and are negative.      Objective:   Physical Exam Vitals reviewed.  HENT:     Head: Normocephalic.  Neck:     Vascular: No carotid bruit.  Cardiovascular:     Rate and Rhythm: Normal rate.  Pulmonary:     Effort: Pulmonary effort is normal.  Musculoskeletal:     Right Lower Extremity: Right leg is amputated below knee.  Skin:    General: Skin is warm and dry.  Neurological:     Mental Status: She is alert and oriented to person, place, and time.     Motor: Weakness present.     Gait: Gait abnormal.  Psychiatric:        Mood and Affect: Mood normal.        Behavior: Behavior normal.        Thought Content: Thought content normal.        Judgment: Judgment normal.     BP (!) 144/66 (BP Location: Left Arm)   Pulse (!) 52   Resp 19   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   Anemia    Anxiety    Arthritis    Barrett's esophagus    Bipolar disorder (HCC)    Blocked  artery    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   Blood clot in vein    Cancer ([place])    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   Carotid arterial disease (HCC)    Contracture of joint of upper arm    Critical limb ischemia of right lower extremity ([place]) 06/21/2021   Depression    Dislocation of right shoulder joint 10/06/2019   Disorder of bone and articular cartilage 09/29/2014   Elevated lipids    GERD (gastroesophageal reflux disease)    Hypertension    Migraine    Osteoporosis    Phantom limb syndrome with pain ([place]) 08/01/2021   Poor balance    Rotator cuff tendinitis, left 05/20/2016   Sinus congestion    Status post total shoulder arthroplasty, 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   Status post total shoulder replacement, 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   Stroke ([place])    x 2   TIA (transient ischemic attack)    Traumatic complete tear of right rotator cuff 10/08/2019   Vertigo     Social History   Socioeconomic History   Marital status: Single    Spouse name: Not on file   Number of children: 3   Years of education: Not  on file   Highest education level: Some college, no degree  Occupational History   Not on file  Tobacco Use   Smoking status: Every Day    Packs/day: 0.50    Types: Cigarettes    Last attempt to quit: 08/04/2016    Years since quitting: 5.4   Smokeless tobacco: Never  Vaping Use   Vaping Use: Never used  Substance and Sexual Activity   Alcohol use: No   Drug use: No   Sexual activity: Not Currently    Birth control/protection: Surgical  Other Topics Concern   Not on file  Social History Narrative   Lives at home alone in an apt   Right handed   Disabled since 1998   Caffeine: about 30 oz daily   Social Determinants of Health   Financial Resource Strain: Low Risk  (02/15/2020)   Overall Financial Resource Strain (CARDIA)    Difficulty of Paying Living Expenses: Not hard at all  Food Insecurity: No Food Insecurity (11/26/2021)   Hunger Vital Sign    Worried About Running Out of Food in the Last Year: Never true    Ran Out of  Food in the Last Year: Never true  Transportation Needs: No Transportation Needs (12/11/2021)   PRAPARE - Hydrologist (Medical): No    Lack of Transportation (Non-Medical): No  Physical Activity: Insufficiently Active (11/26/2021)   Exercise Vital Sign    Days of Exercise per Week: 2 days    Minutes of Exercise per Session: 30 min  Stress: Stress Concern Present (02/15/2020)   [place]    Feeling of Stress : Very much  Social Connections: Unknown (02/15/2020)   Social Connection and Isolation Panel [NHANES]    Frequency of Communication with Friends and Family: More than three times a week    Frequency of Social Gatherings with Friends and Family: More than three times a week    Attends Religious Services: Not on file    Active Member of Clubs or Organizations: Not on file    Attends Archivist Meetings: Not on file    Marital Status: Not on file  Intimate Partner Violence: Not At Risk (02/15/2020)   Humiliation, Afraid, Rape, and Kick questionnaire    Fear of Current or Ex-Partner: No    Emotionally Abused: No    Physically Abused: No    Sexually Abused: No    Past Surgical History:  Procedure Laterality Date   ABDOMINAL HYSTERECTOMY     pt states might still have ovaries   BACK SURGERY     bka right     BREAST BIOPSY Left    CARPAL TUNNEL RELEASE Bilateral    CATARACT EXTRACTION W/ INTRAOCULAR LENS  IMPLANT, BILATERAL Bilateral    CHOLECYSTECTOMY     COLONOSCOPY WITH PROPOFOL N/A 04/09/2018   Procedure: COLONOSCOPY WITH PROPOFOL;  Surgeon: Jonathon Bellows, MD;  Location: [facility] ENDOSCOPY;  Service: Gastroenterology;  Laterality: N/A;   crystal cyst removed on left foot     ESOPHAGOGASTRODUODENOSCOPY (EGD) WITH PROPOFOL N/A 04/09/2018   Procedure: ESOPHAGOGASTRODUODENOSCOPY (EGD) WITH PROPOFOL;  Surgeon: Jonathon Bellows, MD;  Location: [facility] ENDOSCOPY;  Service: Gastroenterology;   Laterality: N/A;   ESOPHAGOGASTRODUODENOSCOPY (EGD) WITH PROPOFOL N/A 12/14/2019   Procedure: ESOPHAGOGASTRODUODENOSCOPY (EGD) WITH PROPOFOL;  Surgeon: Lesly Rubenstein, MD;  Location: [facility] ENDOSCOPY;  Service: Endoscopy;  Laterality: N/A;   EYE SURGERY     femoral fx  FOOT SURGERY     GIVENS CAPSULE STUDY N/A 06/16/2018   Procedure: GIVENS CAPSULE STUDY;  Surgeon: Jonathon Bellows, MD;  Location: [facility] ENDOSCOPY;  Service: Gastroenterology;  Laterality: N/A;   JOINT REPLACEMENT     KNEE SURGERY     LOWER EXTREMITY ANGIOGRAPHY Right 06/21/2021   Procedure: Lower Extremity Angiography;  Surgeon: Katha Cabal, MD;  Location: [place] CV LAB;  Service: Cardiovascular;  Laterality: Right;   ORIF PERIPROSTHETIC FRACTURE Right 10/07/2019   Procedure: OPEN REDUCTION INTERNAL FIXATION (ORIF) PERIPROSTHETIC FRACTURE;  Surgeon: Corky Mull, MD;  Location: [facility] ORS;  Service: Orthopedics;  Laterality: Right;   TONSILLECTOMY     TOTAL KNEE ARTHROPLASTY Bilateral    TOTAL SHOULDER ARTHROPLASTY Left 08/01/2016   Procedure: TOTAL SHOULDER ARTHROPLASTY;  Surgeon: Corky Mull, MD;  Location: [facility] ORS;  Service: Orthopedics;  Laterality: Left;   TOTAL SHOULDER ARTHROPLASTY Right 09/30/2019   Procedure: TOTAL SHOULDER ARTHROPLASTY;  Surgeon: Corky Mull, MD;  Location: [facility] ORS;  Service: Orthopedics;  Laterality: Right;    Family History  Problem Relation Age of Onset   Other Mother        ?lupus    Cancer Mother    High Cholesterol Mother    CAD Father        CABG   High Cholesterol Father    Arthritis Father    Heart murmur Sister    Bradycardia Sister    Heart murmur Brother    High blood pressure Other        "for everybody"   High Cholesterol Other        "for everybody"    Allergies  Allergen Reactions   Hydroxyzine Hives and Rash   Tramadol Hives   Dilaudid [Hydromorphone] Itching   Hydrocodone Hives and Rash    "terrible scratching"  Make take with benadryl    Ketorolac Rash     May take with benadryl   Percocet [Oxycodone-Acetaminophen] Itching and Rash    May take with benadryl     Toradol [Ketorolac Tromethamine] Rash    May take with benadryl       Latest Ref Rng & Units 01/18/2022    3:08 PM 11/07/2021    8:42 PM 08/01/2021   12:31 PM  CBC  WBC 4.0 - 10.5 K/uL 5.7  5.8  4.4   Hemoglobin 12.0 - 15.0 g/dL 9.9  10.7  9.6   Hematocrit 36.0 - 46.0 % 32.5  35.0  30.1   Platelets 150 - 400 K/uL 397  319  336.0       CMP     Component Value Date/Time   NA 138 01/18/2022 1508   NA 139 03/17/2020 1638   K 4.0 01/18/2022 1508   CL 107 01/18/2022 1508   CO2 25 01/18/2022 1508   GLUCOSE 77 01/18/2022 1508   BUN 13 01/18/2022 1508   BUN 9 03/17/2020 1638   CREATININE 0.78 01/18/2022 1508   CALCIUM 8.9 01/18/2022 1508   PROT 7.2 06/21/2021 1500   PROT 6.2 03/17/2020 1638   ALBUMIN 3.3 (L) 06/21/2021 1500   ALBUMIN 4.0 03/17/2020 1638   AST 13 (L) 06/21/2021 1500   ALT 9 06/21/2021 1500   ALKPHOS 72 06/21/2021 1500   BILITOT 0.3 06/21/2021 1500   BILITOT 0.3 03/17/2020 1638   GFRNONAA >60 01/18/2022 1508   GFRAA 86 03/17/2020 1638     No results found.     Assessment & Plan:   1. Bilateral carotid  artery stenosis The patient has a right carotid artery occlusion.  This is not a new finding.  In reviewing previous studies the patient has had a known occlusion since at least 2016.  CT scan shows left ICA stenosis at approximately 50%.  The patient's symptoms would not be caused by her carotid stenosis.  There is still currently no role for intervention.  We will have the patient follow-up annually with carotid duplex.  Alternatively the patient is also established with [facility] for her lower extremity with amputation.  She can also follow annually with that vascular practice, per her preference.  Patient to continue with aspirin and statin, no medication changes recommended.  2. Facet arthropathy, cervical The patient still continues to  have notable neck pain.  This is significant for her.  She had this an orthopedic physician and is following with Dr. Arnette Schaumann.  We will refer her to neurosurgery to see if there is a possible role for intervention given the patient's significant degenerative disc issues in her neck.  3. Critical limb ischemia of right lower extremity (Sydney Evans) Since her intervention in February, the patient has had a right lower extremity amputation.  This is currently followed by [facility].   Current Outpatient Medications on File Prior to Visit  Medication Sig Dispense Refill   acetaminophen (TYLENOL) 500 MG tablet Take 500 mg by mouth every 6 (six) hours as needed.     amLODipine (NORVASC) 5 MG tablet Take 1 tablet (5 mg total) by mouth daily. 30 tablet 11   Carboxymethylcellulose Sodium (LUBRICANT EYE DROPS OP) Place 1 drop into both eyes daily as needed (Dry eye). Systane     citalopram (CELEXA) 40 MG tablet Take 1.5 tablets (60 mg total) by mouth daily. 90 tablet 0   diphenhydrAMINE (BENADRYL) 25 MG tablet Take 25 mg by mouth every 6 (six) hours as needed for itching.     fluticasone (FLONASE) 50 MCG/ACT nasal spray Place 2 sprays into both nostrils daily as needed for allergies or rhinitis. 16 g 12   gabapentin (NEURONTIN) 300 MG capsule Take 1 capsule (300 mg total) by mouth 4 (four) times daily. 360 capsule 1   ibuprofen (ADVIL) 800 MG tablet Take 800 mg by mouth every 8 (eight) hours as needed.     lamoTRIgine (LAMICTAL) 200 MG tablet Take 1 tablet (200 mg total) by mouth daily. 90 tablet 0   omeprazole (PRILOSEC) 40 MG capsule Take 40 mg by mouth daily before breakfast. Take 30 minutes before breakfast.     aspirin EC 81 MG EC tablet Take 1 tablet (81 mg total) by mouth daily. Swallow whole. (Patient not taking: Reported on 01/29/2022) 30 tablet 11   atorvastatin (LIPITOR) 40 MG tablet Take 1 tablet (40 mg total) by mouth daily. (Patient not taking: Reported on 01/29/2022) 90 tablet 1   clotrimazole-betamethasone  (LOTRISONE) cream Apply 1 application. topically daily. (Patient not taking: Reported on 01/29/2022) 30 g 0   No current facility-administered medications on file prior to visit.    There are no Patient Instructions on file for this visit. No follow-ups on file.   Kris Hartmann, NP

## 2022-02-04 ENCOUNTER — Ambulatory Visit (INDEPENDENT_AMBULATORY_CARE_PROVIDER_SITE_OTHER): Payer: Medicare Other | Admitting: Family

## 2022-02-04 ENCOUNTER — Encounter: Payer: Self-pay | Admitting: *Deleted

## 2022-02-04 ENCOUNTER — Encounter: Payer: Self-pay | Admitting: Family

## 2022-02-04 VITALS — BP 160/62 | HR 55 | Temp 98.3°F | Resp 16

## 2022-02-04 DIAGNOSIS — M542 Cervicalgia: Secondary | ICD-10-CM

## 2022-02-04 DIAGNOSIS — R011 Cardiac murmur, unspecified: Secondary | ICD-10-CM | POA: Diagnosis not present

## 2022-02-04 DIAGNOSIS — R001 Bradycardia, unspecified: Secondary | ICD-10-CM

## 2022-02-04 DIAGNOSIS — F319 Bipolar disorder, unspecified: Secondary | ICD-10-CM | POA: Diagnosis not present

## 2022-02-04 MED ORDER — LAMOTRIGINE 200 MG PO TABS: 200.0000 mg | ORAL_TABLET | Freq: Every day | ORAL | 0 refills | Status: DC

## 2022-02-04 MED ORDER — LIDOCAINE 5 % EX PTCH
1.0000 | MEDICATED_PATCH | CUTANEOUS | 0 refills | Status: DC
Start: 2022-02-04 — End: 2022-03-05

## 2022-02-04 NOTE — Progress Notes (Incomplete)
Established Patient Office Visit  Subjective:  Patient ID: Sydney Evans, female    DOB: March 24, 1951  Age: 71 y.o. MRN: [id number]  CC:  Chief Complaint  Patient presents with  . Muscle Pain    From top of head to across shoulder down the arms. Waking up in the middle of the night in pain.    HPI Sydney Evans is here today for follow up.   Pt is with acute concerns.  Bipolar depression with anxiety: pt was unable to get appointment in time and is now completely out of Lamictal 200 mg once daily. They again called psychiatrist but they are saying she is unable to get an appointment until in December.   Cervical radiculopathy: pt states recently saw neurosurgery who referred her to orthopedist. She does see Dr. Roland Rack, and she will make an appointment with them for ongoing pain. Hard to hyperextend as causes pain.    Past Medical History:  Diagnosis Date  . 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 Anemia   . Anxiety   . Arthritis   . Barrett's esophagus   . Bipolar disorder ([place])   . 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 Blood clot in vein   . Cancer ([place])    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  . Carotid arterial disease ([place])   . Contracture of joint of upper arm   . Critical limb ischemia of right lower extremity ([place]) 06/21/2021  . Depression   . Dislocation of right shoulder joint 10/06/2019  . Disorder of bone and articular cartilage 09/29/2014  . Elevated lipids   . GERD (gastroesophageal reflux disease)   . Hypertension   . Migraine   . Osteoporosis   . 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 Poor balance   . Rotator cuff tendinitis, left 05/20/2016  . Sinus congestion   . 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 Stroke ([place])    x 2  . TIA (transient ischemic attack)   . Traumatic complete tear of right rotator cuff 10/08/2019  . Vertigo     Past Surgical History:  Procedure  Laterality Date  . ABDOMINAL HYSTERECTOMY     pt states might still have ovaries  . BACK SURGERY    . bka right    . BREAST BIOPSY Left   . CARPAL TUNNEL RELEASE Bilateral   . CATARACT EXTRACTION W/ INTRAOCULAR LENS  IMPLANT, BILATERAL Bilateral   . CHOLECYSTECTOMY    . COLONOSCOPY WITH PROPOFOL N/A 04/09/2018   Procedure: COLONOSCOPY WITH PROPOFOL;  Surgeon: Jonathon Bellows, MD;  Location: [facility] ENDOSCOPY;  Service: Gastroenterology;  Laterality: N/A;  . crystal cyst removed on left foot    . ESOPHAGOGASTRODUODENOSCOPY (EGD) WITH PROPOFOL N/A 04/09/2018   Procedure: ESOPHAGOGASTRODUODENOSCOPY (EGD) WITH PROPOFOL;  Surgeon: Jonathon Bellows, MD;  Location: [facility] ENDOSCOPY;  Service: Gastroenterology;  Laterality: N/A;  . ESOPHAGOGASTRODUODENOSCOPY (EGD) WITH PROPOFOL N/A 12/14/2019   Procedure: ESOPHAGOGASTRODUODENOSCOPY (EGD) WITH PROPOFOL;  Surgeon: Lesly Rubenstein, MD;  Location: [facility] ENDOSCOPY;  Service: Endoscopy;  Laterality: N/A;  . EYE SURGERY    . femoral fx    . FOOT SURGERY    . GIVENS CAPSULE STUDY N/A 06/16/2018   Procedure: GIVENS CAPSULE STUDY;  Surgeon: Jonathon Bellows, MD;  Location: [facility] ENDOSCOPY;  Service: Gastroenterology;  Laterality: N/A;  . JOINT REPLACEMENT    . KNEE SURGERY    . LOWER EXTREMITY ANGIOGRAPHY Right 06/21/2021  Procedure: Lower Extremity Angiography;  Surgeon: Katha Cabal, MD;  Location: [place] CV LAB;  Service: Cardiovascular;  Laterality: Right;  . ORIF PERIPROSTHETIC FRACTURE Right 10/07/2019   Procedure: OPEN REDUCTION INTERNAL FIXATION (ORIF) PERIPROSTHETIC FRACTURE;  Surgeon: Corky Mull, MD;  Location: [facility] ORS;  Service: Orthopedics;  Laterality: Right;  . TONSILLECTOMY    . TOTAL KNEE ARTHROPLASTY Bilateral   . TOTAL SHOULDER ARTHROPLASTY Left 08/01/2016   Procedure: TOTAL SHOULDER ARTHROPLASTY;  Surgeon: Corky Mull, MD;  Location: [facility] ORS;  Service: Orthopedics;  Laterality: Left;  . TOTAL SHOULDER ARTHROPLASTY Right 09/30/2019    Procedure: TOTAL SHOULDER ARTHROPLASTY;  Surgeon: Corky Mull, MD;  Location: [facility] ORS;  Service: Orthopedics;  Laterality: Right;    Family History  Problem Relation Age of Onset  . Other Mother        ?lupus   . Cancer Mother   . High Cholesterol Mother   . CAD Father        CABG  . High Cholesterol Father   . Arthritis Father   . Heart murmur Sister   . Bradycardia Sister   . Heart murmur Brother   . High blood pressure Other        "for everybody"  . High Cholesterol Other        "for everybody"    Social History   Socioeconomic History  . Marital status: Single    Spouse name: Not on file  . Number of children: 3  . Years of education: Not on file  . Highest education level: Some college, no degree  Occupational History  . Not on file  Tobacco Use  . Smoking status: Every Day    Packs/day: 0.50    Types: Cigarettes    Last attempt to quit: 08/04/2016    Years since quitting: 5.5  . Smokeless tobacco: Never  Vaping Use  . Vaping Use: Never used  Substance and Sexual Activity  . Alcohol use: No  . Drug use: No  . Sexual activity: Not Currently    Birth control/protection: Surgical  Other Topics Concern  . Not on file  Social History Narrative   Lives at home alone in an apt   Right handed   Disabled since 1998   Caffeine: about 30 oz daily   Social Determinants of Health   Financial Resource Strain: Low Risk  (02/15/2020)   Overall Financial Resource Strain (CARDIA)   . Difficulty of Paying Living Expenses: Not hard at all  Food Insecurity: No Food Insecurity (11/26/2021)   Hunger Vital Sign   . Worried About Charity fundraiser in the Last Year: Never true   . Ran Out of Food in the Last Year: Never true  Transportation Needs: No Transportation Needs (12/11/2021)   PRAPARE - Transportation   . Lack of Transportation (Medical): No   . Lack of Transportation (Non-Medical): No  Physical Activity: Insufficiently Active (11/26/2021)   Exercise Vital  Sign   . Days of Exercise per Week: 2 days   . Minutes of Exercise per Session: 30 min  Stress: Stress Concern Present (02/15/2020)   [place]   . Feeling of Stress : Very much  Social Connections: Unknown (02/15/2020)   Social Connection and Isolation Panel [NHANES]   . Frequency of Communication with Friends and Family: More than three times a week   . Frequency of Social Gatherings with Friends and Family: More than three times  a week   . Attends Religious Services: Not on file   . Active Member of Clubs or Organizations: Not on file   . Attends Archivist Meetings: Not on file   . Marital Status: Not on file  Intimate Partner Violence: Not At Risk (02/15/2020)   Humiliation, Afraid, Rape, and Kick questionnaire   . Fear of Current or Ex-Partner: No   . Emotionally Abused: No   . Physically Abused: No   . Sexually Abused: No    Outpatient Medications Prior to Visit  Medication Sig Dispense Refill  . acetaminophen (TYLENOL) 500 MG tablet Take 500 mg by mouth every 6 (six) hours as needed.    [name] amLODipine (NORVASC) 5 MG tablet Take 1 tablet (5 mg total) by mouth daily. 30 tablet 11  . aspirin EC 81 MG EC tablet Take 1 tablet (81 mg total) by mouth daily. Swallow whole. 30 tablet 11  . atorvastatin (LIPITOR) 40 MG tablet Take 1 tablet (40 mg total) by mouth daily. 90 tablet 1  . Carboxymethylcellulose Sodium (LUBRICANT EYE DROPS OP) Place 1 drop into both eyes daily as needed (Dry eye). Systane    . citalopram (CELEXA) 40 MG tablet Take 1.5 tablets (60 mg total) by mouth daily. 90 tablet 0  . clotrimazole-betamethasone (LOTRISONE) cream Apply 1 application. topically daily. 30 g 0  . diphenhydrAMINE (BENADRYL) 25 MG tablet Take 25 mg by mouth every 6 (six) hours as needed for itching.    . fluticasone (FLONASE) 50 MCG/ACT nasal spray Place 2 sprays into both nostrils daily as needed for allergies or  rhinitis. 16 g 12  . gabapentin (NEURONTIN) 300 MG capsule Take 1 capsule (300 mg total) by mouth 4 (four) times daily. 360 capsule 1  . ibuprofen (ADVIL) 800 MG tablet Take 800 mg by mouth every 8 (eight) hours as needed.    . lamoTRIgine (LAMICTAL) 200 MG tablet Take 1 tablet (200 mg total) by mouth daily. 90 tablet 0  . omeprazole (PRILOSEC) 40 MG capsule Take 40 mg by mouth daily before breakfast. Take 30 minutes before breakfast.     No facility-administered medications prior to visit.    Allergies  Allergen Reactions  . Hydroxyzine Hives and Rash  . Tramadol Hives  . Dilaudid [Hydromorphone] Itching  . Hydrocodone Hives and Rash    "terrible scratching"  Make take with benadryl  . Ketorolac Rash     May take with benadryl  . Percocet [Oxycodone-Acetaminophen] Itching and Rash    May take with benadryl    . Toradol [Ketorolac Tromethamine] Rash    May take with benadryl      Review of Systems  Respiratory:  Negative for shortness of breath.   Cardiovascular:  Negative for chest pain and palpitations.  Gastrointestinal:  Negative for constipation and diarrhea.  Genitourinary:  Negative for dysuria, frequency and urgency.  Musculoskeletal:  Negative for myalgias.  Psychiatric/Behavioral:  Negative for depression and suicidal ideas.   All other systems reviewed and are negative.    Objective:    Physical Exam  Gen: NAD, resting comfortably HEENT: TMs normal bilaterally. OP clear. No thyromegaly noted.  CV: RRR with no murmurs appreciated Pulm: NWOB, CTAB with no crackles, wheezes, or rhonchi GI: Normal bowel sounds present. Soft, Nontender, Nondistended. MSK: no edema, cyanosis, or clubbing noted Skin: warm, dry Psych: Normal affect and thought content  BP (!) 160/62   Pulse (!) 55   Temp 98.3 F (36.8 C)   Resp 16  SpO2 96%  Wt Readings from Last 3 Encounters:  11/27/21 147 lb (66.7 kg)  11/07/21 149 lb 14.6 oz (68 kg)  06/21/21 148 lb (67.1 kg)      Health Maintenance Due  Topic Date Due  . COVID-19 Vaccine (1) Never done  . Pneumonia Vaccine 99+ Years old (1 - PCV) Never done  . Hepatitis C Screening  Never done  . TETANUS/TDAP  Never done  . MAMMOGRAM  Never done  . Zoster Vaccines- Shingrix (1 of 2) Never done  . DEXA SCAN  Never done    There are no preventive care reminders to display for this patient.  Lab Results  Component Value Date   TSH 4.295 06/21/2021   Lab Results  Component Value Date   WBC 5.7 01/18/2022   HGB 9.9 (L) 01/18/2022   HCT 32.5 (L) 01/18/2022   MCV 80.0 01/18/2022   PLT 397 01/18/2022   Lab Results  Component Value Date   NA 138 01/18/2022   K 4.0 01/18/2022   CO2 25 01/18/2022   GLUCOSE 77 01/18/2022   BUN 13 01/18/2022   CREATININE 0.78 01/18/2022   BILITOT 0.3 06/21/2021   ALKPHOS 72 06/21/2021   AST 13 (L) 06/21/2021   ALT 9 06/21/2021   PROT 7.2 06/21/2021   ALBUMIN 3.3 (L) 06/21/2021   CALCIUM 8.9 01/18/2022   ANIONGAP 6 01/18/2022   GFR 88.58 08/01/2021   Lab Results  Component Value Date   CHOL 221 (H) 03/17/2020   Lab Results  Component Value Date   HDL 59 03/17/2020   Lab Results  Component Value Date   LDLCALC 150 (H) 03/17/2020   Lab Results  Component Value Date   TRIG 69 03/17/2020   Lab Results  Component Value Date   CHOLHDL 4.5 12/23/2014   Lab Results  Component Value Date   HGBA1C 5.3 12/23/2014      Assessment & Plan:   Problem List Items Addressed This Visit   None   No orders of the defined types were placed in this encounter.   Follow-up: No follow-ups on file.    Eugenia Pancoast, FNP

## 2022-02-04 NOTE — Patient Instructions (Addendum)
  Call this number for psychiatry:  [phone]  For pain control let's try this You can take robaxin at night for muscle spasm or neck tightness You can take tylenol arthritis strength throughout the day And you can use lidocaine 4% over the counter patches  Use heat as needed  AND you can apply voltaren gel to the site.   A referral was placed today for cardiology. Please let us know if you have not heard back within 2 weeks about the referral.  Call orthopedist as well to set up appointment.   Regards,   Eugenia Pancoast FNP-C

## 2022-02-06 DIAGNOSIS — M542 Cervicalgia: Secondary | ICD-10-CM | POA: Insufficient documentation

## 2022-02-06 NOTE — Assessment & Plan Note (Signed)
Rx sent for lidocaine 5% ice patient she can take Tylenol 500 mg 3 times a day as needed for pain Warm to site as well as ice alternating to see which one feels better

## 2022-02-06 NOTE — Progress Notes (Signed)
Established Patient Office Visit  Subjective:  Patient ID: Sydney Evans, female    DOB: 01/23/1951  Age: 71 y.o. MRN: [id number]  CC:  Chief Complaint  Patient presents with   Muscle Pain    From top of head to across shoulder down the arms. Waking up in the middle of the night in pain.    HPI Sydney Evans is here today for follow up.   Pt is with acute concerns.   Bipolar depression with anxiety: pt was unable to get appointment in time and is now completely out of Lamictal 200 mg once daily. They again called psychiatrist but they are saying she is unable to get an appointment until in December.  Patient is currently stable emotionally and has been taking Lamictal for years   Cervical radiculopathy: pt states recently saw neurosurgery who referred her to orthopedist. She does see Dr. Roland Rack, and she will make an appointment with them for ongoing pain. Hard to hyperextend as causes pain.    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   Anemia    Anxiety    Arthritis    Barrett's esophagus    Bipolar disorder (HCC)    Blocked artery    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   Blood clot in vein    Cancer ([place])    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   Carotid arterial disease ([place])    Contracture of joint of upper arm    Critical limb ischemia of right lower extremity ([place]) 06/21/2021   Depression    Dislocation of right shoulder joint 10/06/2019   Disorder of bone and articular cartilage 09/29/2014   Elevated lipids    GERD (gastroesophageal reflux disease)    Hypertension    Migraine    Osteoporosis    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   Poor balance    Rotator cuff tendinitis, left 05/20/2016   Sinus congestion    Status post total shoulder arthroplasty, 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   Status post total shoulder replacement, 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   Stroke ([place])    x 2   TIA (transient ischemic attack)    Traumatic complete tear of right rotator cuff 10/08/2019   Vertigo      Past Surgical History:  Procedure Laterality Date   ABDOMINAL HYSTERECTOMY     pt states might still have ovaries   BACK SURGERY     bka right     BREAST BIOPSY Left    CARPAL TUNNEL RELEASE Bilateral    CATARACT EXTRACTION W/ INTRAOCULAR LENS  IMPLANT, BILATERAL Bilateral    CHOLECYSTECTOMY     COLONOSCOPY WITH PROPOFOL N/A 04/09/2018   Procedure: COLONOSCOPY WITH PROPOFOL;  Surgeon: Jonathon Bellows, MD;  Location: [facility] ENDOSCOPY;  Service: Gastroenterology;  Laterality: N/A;   crystal cyst removed on left foot     ESOPHAGOGASTRODUODENOSCOPY (EGD) WITH PROPOFOL N/A 04/09/2018   Procedure: ESOPHAGOGASTRODUODENOSCOPY (EGD) WITH PROPOFOL;  Surgeon: Jonathon Bellows, MD;  Location: [facility] ENDOSCOPY;  Service: Gastroenterology;  Laterality: N/A;   ESOPHAGOGASTRODUODENOSCOPY (EGD) WITH PROPOFOL N/A 12/14/2019   Procedure: ESOPHAGOGASTRODUODENOSCOPY (EGD) WITH PROPOFOL;  Surgeon: Lesly Rubenstein, MD;  Location: [facility] ENDOSCOPY;  Service: Endoscopy;  Laterality: N/A;   EYE SURGERY     femoral fx     FOOT SURGERY     GIVENS CAPSULE STUDY N/A 06/16/2018   Procedure: GIVENS CAPSULE STUDY;  Surgeon: Jonathon Bellows, MD;  Location: [facility] ENDOSCOPY;  Service: Gastroenterology;  Laterality: N/A;   JOINT REPLACEMENT  KNEE SURGERY     LOWER EXTREMITY ANGIOGRAPHY Right 06/21/2021   Procedure: Lower Extremity Angiography;  Surgeon: Katha Cabal, MD;  Location: [facility];  Service: Cardiovascular;  Laterality: Right;   ORIF PERIPROSTHETIC FRACTURE Right 10/07/2019   Procedure: OPEN REDUCTION INTERNAL FIXATION (ORIF) PERIPROSTHETIC FRACTURE;  Surgeon: Corky Mull, MD;  Location: [facility] ORS;  Service: Orthopedics;  Laterality: Right;   TONSILLECTOMY     TOTAL KNEE ARTHROPLASTY Bilateral    TOTAL SHOULDER ARTHROPLASTY Left 08/01/2016   Procedure: TOTAL SHOULDER ARTHROPLASTY;  Surgeon: Corky Mull, MD;  Location: [facility] ORS;  Service: Orthopedics;  Laterality: Left;   TOTAL SHOULDER  ARTHROPLASTY Right 09/30/2019   Procedure: TOTAL SHOULDER ARTHROPLASTY;  Surgeon: Corky Mull, MD;  Location: [facility] ORS;  Service: Orthopedics;  Laterality: Right;    Family History  Problem Relation Age of Onset   Other Mother        ?lupus    Cancer Mother    High Cholesterol Mother    CAD Father        CABG   High Cholesterol Father    Arthritis Father    Heart murmur Sister    Bradycardia Sister    Heart murmur Brother    High blood pressure Other        "for everybody"   High Cholesterol Other        "for everybody"    Social History   Socioeconomic History   Marital status: Single    Spouse name: Not on file   Number of children: 3   Years of education: Not on file   Highest education level: Some college, no degree  Occupational History   Not on file  Tobacco Use   Smoking status: Every Day    Packs/day: 0.50    Types: Cigarettes    Last attempt to quit: 08/04/2016    Years since quitting: 5.5   Smokeless tobacco: Never  Vaping Use   Vaping Use: Never used  Substance and Sexual Activity   Alcohol use: No   Drug use: No   Sexual activity: Not Currently    Birth control/protection: Surgical  Other Topics Concern   Not on file  Social History Narrative   Lives at home alone in an apt   Right handed   Disabled since 1998   Caffeine: about 30 oz daily   Social Determinants of Health   Financial Resource Strain: Low Risk  (02/15/2020)   Overall Financial Resource Strain (CARDIA)    Difficulty of Paying Living Expenses: Not hard at all  Food Insecurity: No Food Insecurity (11/26/2021)   Hunger Vital Sign    Worried About Running Out of Food in the Last Year: Never true    Ran Out of Food in the Last Year: Never true  Transportation Needs: No Transportation Needs (12/11/2021)   PRAPARE - Hydrologist (Medical): No    Lack of Transportation (Non-Medical): No  Physical Activity: Insufficiently Active (11/26/2021)   Exercise Vital  Sign    Days of Exercise per Week: 2 days    Minutes of Exercise per Session: 30 min  Stress: Stress Concern Present (02/15/2020)   [place]    Feeling of Stress : Very much  Social Connections: Unknown (02/15/2020)   Social Connection and Isolation Panel [NHANES]    Frequency of Communication with Friends and Family: More than three times a week  Frequency of Social Gatherings with Friends and Family: More than three times a week    Attends Religious Services: Not on file    Active Member of Clubs or Organizations: Not on file    Attends Archivist Meetings: Not on file    Marital Status: Not on file  Intimate Partner Violence: Not At Risk (02/15/2020)   Humiliation, Afraid, Rape, and Kick questionnaire    Fear of Current or Ex-Partner: No    Emotionally Abused: No    Physically Abused: No    Sexually Abused: No    Outpatient Medications Prior to Visit  Medication Sig Dispense Refill   acetaminophen (TYLENOL) 500 MG tablet Take 500 mg by mouth every 6 (six) hours as needed.     amLODipine (NORVASC) 5 MG tablet Take 1 tablet (5 mg total) by mouth daily. 30 tablet 11   aspirin EC 81 MG EC tablet Take 1 tablet (81 mg total) by mouth daily. Swallow whole. 30 tablet 11   atorvastatin (LIPITOR) 40 MG tablet Take 1 tablet (40 mg total) by mouth daily. 90 tablet 1   Carboxymethylcellulose Sodium (LUBRICANT EYE DROPS OP) Place 1 drop into both eyes daily as needed (Dry eye). Systane     citalopram (CELEXA) 40 MG tablet Take 1.5 tablets (60 mg total) by mouth daily. 90 tablet 0   clotrimazole-betamethasone (LOTRISONE) cream Apply 1 application. topically daily. 30 g 0   diphenhydrAMINE (BENADRYL) 25 MG tablet Take 25 mg by mouth every 6 (six) hours as needed for itching.     fluticasone (FLONASE) 50 MCG/ACT nasal spray Place 2 sprays into both nostrils daily as needed for allergies or rhinitis. 16 g 12    gabapentin (NEURONTIN) 300 MG capsule Take 1 capsule (300 mg total) by mouth 4 (four) times daily. 360 capsule 1   ibuprofen (ADVIL) 800 MG tablet Take 800 mg by mouth every 8 (eight) hours as needed.     omeprazole (PRILOSEC) 40 MG capsule Take 40 mg by mouth daily before breakfast. Take 30 minutes before breakfast.     lamoTRIgine (LAMICTAL) 200 MG tablet Take 1 tablet (200 mg total) by mouth daily. 90 tablet 0   No facility-administered medications prior to visit.    Allergies  Allergen Reactions   Hydroxyzine Hives and Rash   Tramadol Hives   Dilaudid [Hydromorphone] Itching   Hydrocodone Hives and Rash    "terrible scratching"  Make take with benadryl   Ketorolac Rash     May take with benadryl   Percocet [Oxycodone-Acetaminophen] Itching and Rash    May take with benadryl     Toradol [Ketorolac Tromethamine] Rash    May take with benadryl      Review of Systems  Respiratory:  Negative for shortness of breath.   Cardiovascular:  Negative for chest pain and palpitations.  Gastrointestinal:  Negative for constipation and diarrhea.  Genitourinary:  Negative for dysuria, frequency and urgency.  Musculoskeletal:  Negative for myalgias.  Psychiatric/Behavioral:  Negative for depression and suicidal ideas.   All other systems reviewed and are negative.    Objective:    Physical Exam  Gen: NAD, resting comfortably HEENT: TMs normal bilaterally. OP clear. No thyromegaly noted.  CV: RRR with no murmurs appreciated Pulm: NWOB, CTAB with no crackles, wheezes, or rhonchi GI: Normal bowel sounds present. Soft, Nontender, Nondistended. MSK: no edema, cyanosis, or clubbing noted Skin: warm, dry Psych: Normal affect and thought content  BP (!) 160/62   Pulse (!) 55  Temp 98.3 F (36.8 C)   Resp 16   SpO2 96%  Wt Readings from Last 3 Encounters:  11/27/21 147 lb (66.7 kg)  11/07/21 149 lb 14.6 oz (68 kg)  06/21/21 148 lb (67.1 kg)     Health Maintenance Due  Topic  Date Due   COVID-19 Vaccine (1) Never done   Pneumonia Vaccine 23+ Years old (1 - PCV) Never done   Hepatitis C Screening  Never done   TETANUS/TDAP  Never done   MAMMOGRAM  Never done   Zoster Vaccines- Shingrix (1 of 2) Never done   DEXA SCAN  Never done    There are no preventive care reminders to display for this patient.  Lab Results  Component Value Date   TSH 4.295 06/21/2021   Lab Results  Component Value Date   WBC 5.7 01/18/2022   HGB 9.9 (L) 01/18/2022   HCT 32.5 (L) 01/18/2022   MCV 80.0 01/18/2022   PLT 397 01/18/2022   Lab Results  Component Value Date   NA 138 01/18/2022   K 4.0 01/18/2022   CO2 25 01/18/2022   GLUCOSE 77 01/18/2022   BUN 13 01/18/2022   CREATININE 0.78 01/18/2022   BILITOT 0.3 06/21/2021   ALKPHOS 72 06/21/2021   AST 13 (L) 06/21/2021   ALT 9 06/21/2021   PROT 7.2 06/21/2021   ALBUMIN 3.3 (L) 06/21/2021   CALCIUM 8.9 01/18/2022   ANIONGAP 6 01/18/2022   GFR 88.58 08/01/2021   Lab Results  Component Value Date   CHOL 221 (H) 03/17/2020   Lab Results  Component Value Date   HDL 59 03/17/2020   Lab Results  Component Value Date   LDLCALC 150 (H) 03/17/2020   Lab Results  Component Value Date   TRIG 69 03/17/2020   Lab Results  Component Value Date   CHOLHDL 4.5 12/23/2014   Lab Results  Component Value Date   HGBA1C 5.3 12/23/2014      Assessment & Plan:   Problem List Items Addressed This Visit       Cardiovascular and Mediastinum   Chronic sinus bradycardia    Bradycardia with heart murmur auscultated on physical exam, patient informed to follow-up with cardiology and consult has been placed.  Patient does have history of ventricular tachycardia as well and has essential hypertension and history of TIA        Other   Bipolar depression ([place]) - Primary    Again discussed with patient that she needs to see psychiatrist I will again refill Lamictal 200 mg once daily however I am not will not provide refill  she needs to establish with psychiatry and continue with them for ongoing medication as I do not treat bipolar      Relevant Medications   lamoTRIgine (LAMICTAL) 200 MG tablet   Other Relevant Orders   Ambulatory referral to Psychiatry   Cervicalgia    Rx sent for lidocaine 5% ice patient she can take Tylenol 500 mg 3 times a day as needed for pain Warm to site as well as ice alternating to see which one feels better      Relevant Medications   lidocaine (LIDODERM) 5 %   Other Visit Diagnoses     Heart murmur       Relevant Orders   Ambulatory referral to Cardiology   Bradycardia       Relevant Orders   Ambulatory referral to Cardiology       Meds ordered this encounter  Medications   lidocaine (LIDODERM) 5 %    Sig: Place 1 patch onto the skin daily. Remove & Discard patch within 12 hours or as directed by MD    Dispense:  30 patch    Refill:  0    Order Specific Question:   Supervising Provider    Answer:   Diona Browner, AMY E [[id number]]   lamoTRIgine (LAMICTAL) 200 MG tablet    Sig: Take 1 tablet (200 mg total) by mouth daily.    Dispense:  90 tablet    Refill:  0    Order Specific Question:   Supervising Provider    Answer:   Diona Browner, AMY E [[id number]]    Follow-up: Return in about 3 months (around 05/07/2022) for f/u cholesterol.    Eugenia Pancoast, FNP

## 2022-02-06 NOTE — Assessment & Plan Note (Addendum)
Bradycardia with heart murmur auscultated on physical exam, patient informed to follow-up with cardiology and consult has been placed.  Patient does have history of ventricular tachycardia as well and has essential hypertension and history of TIA

## 2022-02-06 NOTE — Assessment & Plan Note (Signed)
Again discussed with patient that she needs to see psychiatrist I will again refill Lamictal 200 mg once daily however I am not will not provide refill she needs to establish with psychiatry and continue with them for ongoing medication as I do not treat bipolar

## 2022-02-11 ENCOUNTER — Other Ambulatory Visit: Payer: Self-pay | Admitting: Family

## 2022-02-11 DIAGNOSIS — E782 Mixed hyperlipidemia: Secondary | ICD-10-CM

## 2022-02-11 DIAGNOSIS — I70221 Atherosclerosis of native arteries of extremities with rest pain, right leg: Secondary | ICD-10-CM

## 2022-02-11 DIAGNOSIS — Z89511 Acquired absence of right leg below knee: Secondary | ICD-10-CM

## 2022-02-14 DIAGNOSIS — I6521 Occlusion and stenosis of right carotid artery: Secondary | ICD-10-CM | POA: Diagnosis not present

## 2022-02-14 DIAGNOSIS — K219 Gastro-esophageal reflux disease without esophagitis: Secondary | ICD-10-CM

## 2022-02-14 DIAGNOSIS — Z7982 Long term (current) use of aspirin: Secondary | ICD-10-CM

## 2022-02-14 DIAGNOSIS — Z86718 Personal history of other venous thrombosis and embolism: Secondary | ICD-10-CM

## 2022-02-14 DIAGNOSIS — H9192 Unspecified hearing loss, left ear: Secondary | ICD-10-CM

## 2022-02-14 DIAGNOSIS — I739 Peripheral vascular disease, unspecified: Secondary | ICD-10-CM

## 2022-02-14 DIAGNOSIS — Z96653 Presence of artificial knee joint, bilateral: Secondary | ICD-10-CM

## 2022-02-14 DIAGNOSIS — Z96643 Presence of artificial hip joint, bilateral: Secondary | ICD-10-CM

## 2022-02-14 DIAGNOSIS — N390 Urinary tract infection, site not specified: Secondary | ICD-10-CM | POA: Diagnosis not present

## 2022-02-14 DIAGNOSIS — M810 Age-related osteoporosis without current pathological fracture: Secondary | ICD-10-CM

## 2022-02-14 DIAGNOSIS — Z87440 Personal history of urinary (tract) infections: Secondary | ICD-10-CM

## 2022-02-14 DIAGNOSIS — I69354 Hemiplegia and hemiparesis following cerebral infarction affecting left non-dominant side: Secondary | ICD-10-CM

## 2022-02-14 DIAGNOSIS — Z7902 Long term (current) use of antithrombotics/antiplatelets: Secondary | ICD-10-CM

## 2022-02-14 DIAGNOSIS — F319 Bipolar disorder, unspecified: Secondary | ICD-10-CM

## 2022-02-14 DIAGNOSIS — Z87891 Personal history of nicotine dependence: Secondary | ICD-10-CM

## 2022-02-14 DIAGNOSIS — I10 Essential (primary) hypertension: Secondary | ICD-10-CM | POA: Diagnosis not present

## 2022-02-14 DIAGNOSIS — Z4781 Encounter for orthopedic aftercare following surgical amputation: Secondary | ICD-10-CM | POA: Diagnosis not present

## 2022-02-14 DIAGNOSIS — Z9181 History of falling: Secondary | ICD-10-CM

## 2022-02-14 DIAGNOSIS — Z89511 Acquired absence of right leg below knee: Secondary | ICD-10-CM

## 2022-02-14 DIAGNOSIS — D509 Iron deficiency anemia, unspecified: Secondary | ICD-10-CM

## 2022-02-19 ENCOUNTER — Telehealth: Payer: Self-pay | Admitting: Family

## 2022-02-19 NOTE — Telephone Encounter (Signed)
Patient req CB 03/04/22

## 2022-02-19 NOTE — Telephone Encounter (Signed)
Patient called in to let Tabitha know that she was able to get her appointment with the psychiatrist.
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
## 2022-02-20 NOTE — Telephone Encounter (Signed)
Great news. Noted.

## 2022-02-21 ENCOUNTER — Ambulatory Visit (INDEPENDENT_AMBULATORY_CARE_PROVIDER_SITE_OTHER): Payer: Medicare Other | Admitting: Psychiatry

## 2022-02-21 ENCOUNTER — Encounter: Payer: Self-pay | Admitting: Psychiatry

## 2022-02-21 VITALS — BP 181/67 | HR 51 | Temp 98.2°F

## 2022-02-21 DIAGNOSIS — F17200 Nicotine dependence, unspecified, uncomplicated: Secondary | ICD-10-CM

## 2022-02-21 DIAGNOSIS — F319 Bipolar disorder, unspecified: Secondary | ICD-10-CM

## 2022-02-21 DIAGNOSIS — F4310 Post-traumatic stress disorder, unspecified: Secondary | ICD-10-CM

## 2022-02-21 MED ORDER — CITALOPRAM HYDROBROMIDE 40 MG PO TABS: 40.0000 mg | ORAL_TABLET | Freq: Every day | ORAL | 0 refills | Status: DC

## 2022-02-21 MED ORDER — LAMOTRIGINE 200 MG PO TABS: 200.0000 mg | ORAL_TABLET | Freq: Every day | ORAL | 0 refills | Status: DC

## 2022-02-21 MED ORDER — ARIPIPRAZOLE 2 MG PO TABS: 2.0000 mg | ORAL_TABLET | Freq: Every day | ORAL | 1 refills | Status: DC

## 2022-02-21 NOTE — Patient Instructions (Signed)
Continue citalopram 40 mg daily  Continue lamotrigine 200 mg daily  Start Abilify 2 mg at night  Referral to therapy  Next appointment: 12/18 at 10 AM

## 2022-03-04 ENCOUNTER — Telehealth: Payer: Self-pay

## 2022-03-04 ENCOUNTER — Encounter (INDEPENDENT_AMBULATORY_CARE_PROVIDER_SITE_OTHER): Payer: Self-pay

## 2022-03-04 MED ORDER — OMEPRAZOLE 40 MG PO CPDR: 40.0000 mg | DELAYED_RELEASE_CAPSULE | Freq: Every day | ORAL | 1 refills | Status: DC

## 2022-03-04 NOTE — Telephone Encounter (Signed)
[place] Night - Client Nonclinical Telephone Record  [facility] Client [facility] Night - Client Client Site [place] Provider AA - PHYSICIAN, NOT LISTED- MD Contact Type Call Who Is Calling Patient / Member / Family / Caregiver Caller Name [place] Phone Number [phone] Patient Name Sydney Evans Patient DOB 1950/11/23 Call Type Message Only Information Provided Reason for Call Medication Question / Request Initial Comment Caller states, needs medication called in. Has acid reflux. Rx Onetrabols GR '40mg'$ . Takes one twice a day. Dr. is Thurman Coyer. Has enough for two days. Additional Comment Caller declined triage. Ok to send request to office. Disp. Time Disposition Final User 03/02/2022 11:53:39 AM General Information Provided Yes Jobie Quaker Call Closed By: Jobie Quaker Transaction Date/Time: 03/02/2022 11:47:55 AM (ET  Pt last seen 02/04/2022.

## 2022-03-04 NOTE — Progress Notes (Addendum)
Referring Physician:  Kris Hartmann, NP [place],  [place] [address]  Primary Physician:  Eugenia Pancoast, FNP  History of Present Illness: 03/05/2022 [name] has a history of anxiety, bipolar disorder, carotid arterial disease, hypertension. History of right above the ankle amputation.   Went to ED on 01/18/22 with severe neck and bilateral arm pain that woke her up. Pain had resolved once she was in ED, but then started to return.   MRIs of thoracic and cervical spine done. CTA of chest and neck done as well with no acute abnormality. She saw vascular on 01/29/22. She was seeing Dr. Roland Rack for her neck previously. Last seen by Lattie Corns PA-C and he referred her to physiatry for possible cervical injections.   She has constant posterior neck pain that varies in severity. She has intermittent bilateral arm pain into her hands. She had episode of severe pain in left hand and felt like her hand was "blue." She went to ED at [facility] and has follow up with vascular in a few weeks.   Some relief with sleeping in soft cervical collar.   History of bilateral shoulder replacements with revision done on right.   Conservative measures:  Physical therapy: none for spine. 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  Multimodal medical therapy including regular antiinflammatories: tylenol, neurontin, motrin, lidoderm, robaxin.   Injections: No epidural steroid injections  Past Surgery: T7-T10 thoracic kyphoplasty years ago  Sydney Evans has no symptoms of cervical myelopathy.  The symptoms are causing a significant impact on the patient's life.   Review of Systems:  A 10 point review of systems is negative, except for the pertinent positives and negatives detailed in the HPI.  Past Medical History: 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   Anemia    Anxiety    Arthritis    Barrett's esophagus     Bipolar disorder (HCC)    Blocked artery    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   Blood clot in vein    Cancer ([place])    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   Carotid arterial disease ([place])    Contracture of joint of upper arm    Critical limb ischemia of right lower extremity ([place]) 06/21/2021   Depression    Dislocation of right shoulder joint 10/06/2019   Disorder of bone and articular cartilage 09/29/2014   Elevated lipids    GERD (gastroesophageal reflux disease)    Hypertension    Migraine    Osteoporosis    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   Poor balance    Rotator cuff tendinitis, left 05/20/2016   Sinus congestion    Status post total shoulder arthroplasty, 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   Status post total shoulder replacement, 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   Stroke ([place])    x 2   TIA (transient ischemic attack)    Traumatic complete tear of right rotator cuff 10/08/2019   Vertigo     Past Surgical History: Past Surgical History:  Procedure Laterality Date   ABDOMINAL HYSTERECTOMY     pt states might still have ovaries   bka right     BREAST BIOPSY Left    CARPAL TUNNEL RELEASE Bilateral    CATARACT EXTRACTION W/ INTRAOCULAR LENS  IMPLANT, BILATERAL Bilateral    CHOLECYSTECTOMY     COLONOSCOPY WITH PROPOFOL N/A 04/09/2018   Procedure: COLONOSCOPY WITH PROPOFOL;  Surgeon: Jonathon Bellows, MD;  Location: [facility] ENDOSCOPY;  Service:  Gastroenterology;  Laterality: N/A;   crystal cyst removed on left foot     ESOPHAGOGASTRODUODENOSCOPY (EGD) WITH PROPOFOL N/A 04/09/2018   Procedure: ESOPHAGOGASTRODUODENOSCOPY (EGD) WITH PROPOFOL;  Surgeon: Jonathon Bellows, MD;  Location[facility] ENDOSCOPY;  Service: Gastroenterology;  Laterality: N/A;   ESOPHAGOGASTRODUODENOSCOPY (EGD) WITH PROPOFOL N/A 12/14/2019   Procedure: ESOPHAGOGASTRODUODENOSCOPY (EGD) WITH PROPOFOL;  Surgeon: Lesly Rubenstein, MD;  Location: [facility] ENDOSCOPY;  Service: Endoscopy;  Laterality: N/A;   EYE SURGERY     femoral fx     FOOT SURGERY     GIVENS CAPSULE STUDY N/A 06/16/2018    Procedure: GIVENS CAPSULE STUDY;  Surgeon: Jonathon Bellows, MD;  Location: [facility] ENDOSCOPY;  Service: Gastroenterology;  Laterality: N/A;   JOINT REPLACEMENT     KNEE SURGERY     KYPHOPLASTY     LOWER EXTREMITY ANGIOGRAPHY Right 06/21/2021   Procedure: Lower Extremity Angiography;  Surgeon: Katha Cabal, MD;  Location: [place] CV LAB;  Service: Cardiovascular;  Laterality: Right;   ORIF PERIPROSTHETIC FRACTURE Right 10/07/2019   Procedure: OPEN REDUCTION INTERNAL FIXATION (ORIF) PERIPROSTHETIC FRACTURE;  Surgeon: Corky Mull, MD;  Location: [facility] ORS;  Service: Orthopedics;  Laterality: Right;   TONSILLECTOMY     TOTAL KNEE ARTHROPLASTY Bilateral    TOTAL SHOULDER ARTHROPLASTY Left 08/01/2016   Procedure: TOTAL SHOULDER ARTHROPLASTY;  Surgeon: Corky Mull, MD;  Location: [facility] ORS;  Service: Orthopedics;  Laterality: Left;   TOTAL SHOULDER ARTHROPLASTY Right 09/30/2019   Procedure: TOTAL SHOULDER ARTHROPLASTY;  Surgeon: Corky Mull, MD;  Location: [facility] ORS;  Service: Orthopedics;  Laterality: Right;    Allergies: Allergies as of 03/05/2022 - Review Complete 03/05/2022  Allergen Reaction Noted   Hydroxyzine Hives and Rash 01/20/2018   Tramadol Hives 12/21/2021   Dilaudid [hydromorphone] Itching 12/13/2019   Hydrocodone Hives and Rash 07/22/2016   Ketorolac Rash    Percocet [oxycodone-acetaminophen] Itching and Rash 12/22/2014   Toradol [ketorolac tromethamine] Rash 08/01/2016    Medications: Outpatient Encounter Medications as of 03/05/2022  Medication Sig   acetaminophen (TYLENOL) 500 MG tablet Take 500 mg by mouth every 6 (six) hours as needed.   amLODipine (NORVASC) 5 MG tablet Take 1 tablet (5 mg total) by mouth daily.   ARIPiprazole (ABILIFY) 2 MG tablet Take 1 tablet (2 mg total) by mouth at bedtime.   atorvastatin (LIPITOR) 40 MG tablet Take 1 tablet (40 mg total) by mouth daily.   Carboxymethylcellulose Sodium (LUBRICANT EYE DROPS OP) Place 1 drop into both eyes  daily as needed (Dry eye). Systane   citalopram (CELEXA) 40 MG tablet Take 1 tablet (40 mg total) by mouth daily.   clotrimazole-betamethasone (LOTRISONE) cream Apply 1 application. topically daily.   diphenhydrAMINE (BENADRYL) 25 MG tablet Take 25 mg by mouth every 6 (six) hours as needed for itching.   fluticasone (FLONASE) 50 MCG/ACT nasal spray Place 2 sprays into both nostrils daily as needed for allergies or rhinitis.   gabapentin (NEURONTIN) 300 MG capsule Take 1 capsule (300 mg total) by mouth 4 (four) times daily.   ibuprofen (ADVIL) 800 MG tablet Take 800 mg by mouth every 8 (eight) hours as needed.   lamoTRIgine (LAMICTAL) 200 MG tablet Take 1 tablet (200 mg total) by mouth daily.   methocarbamol (ROBAXIN) 500 MG tablet Take 500 mg by mouth 4 (four) times daily.   omeprazole (PRILOSEC) 40 MG capsule Take 1 capsule (40 mg total) by mouth daily before breakfast. Take 30 minutes before breakfast.   propranolol (INDERAL) 20 MG  tablet Take 20 mg by mouth 3 (three) times daily. (Patient not taking: Reported on 03/05/2022)   [DISCONTINUED] aspirin EC 81 MG EC tablet Take 1 tablet (81 mg total) by mouth daily. Swallow whole. (Patient not taking: Reported on 02/21/2022)   [DISCONTINUED] lidocaine (LIDODERM) 5 % Place 1 patch onto the skin daily. Remove & Discard patch within 12 hours or as directed by MD   No facility-administered encounter medications on file as of 03/05/2022.    Social History: Social History   Tobacco Use   Smoking status: Every Day    Packs/day: 0.50    Types: Cigarettes    Last attempt to quit: 08/04/2016    Years since quitting: 5.5   Smokeless tobacco: Never   Tobacco comments:    1 pack lasts about 2.5 days  Vaping Use   Vaping Use: Never used  Substance Use Topics   Alcohol use: No   Drug use: No    Family Medical History: Family History  Problem Relation Age of Onset   Other Mother        ?lupus    Cancer Mother    High Cholesterol Mother     Alcohol abuse Father    CAD Father        CABG   High Cholesterol Father    Arthritis Father    Heart murmur Sister    Bradycardia Sister    Heart murmur Brother    High blood pressure Other        "for everybody"   High Cholesterol Other        "for everybody"    Physical Examination: Vitals:   03/05/22 1413 03/05/22 1508  BP: (!) 182/83 (!) 162/90  Pulse: 65     General: Patient is well developed, well nourished, calm, collected, and in no apparent distress. Attention to examination is appropriate.  Respiratory: Patient is breathing without any difficulty.   NEUROLOGICAL:     Awake, alert, oriented to person, place, and time.  Speech is clear and fluent. Fund of knowledge is appropriate.   Cranial Nerves: Pupils equal round and reactive to light.  Facial tone is symmetric.  Facial sensation is symmetric.  ROM of spine:  Limited ROM of cervical spine with pain.   She has mild trapezial tenderness on left.   She has well healed bilateral shoulder incisions. She has limited ROM of both shoulders with pain.   No abnormal lesions on exposed skin.   Strength: Side Biceps Triceps Deltoid Interossei Grip Wrist Ext. Wrist Flex.  R '5 5 5 5 5 5 5  '$ L '5 5 5 5 5 5 5   '$ Side Iliopsoas Quads Hamstring PF DF EHL  R '5 5 5 '$ N/A N/A N/A  L '5 5 5 5 5 5   '$ Reflexes are 1+ and symmetric at the biceps, triceps, brachioradialis and patella bilaterally. Achilles on left is 1+. .   Hoffman's is absent.  Clonus is not present.   Bilateral upper and lower extremity sensation is intact to light touch.     No gross weakness in bilateral upper extremities, but she does not give great effort.   Radial pulses are strong in both wrists. Good capillary refill in both hands and skin is warm to the touch.   Gait not tested. She is in a WC.    Medical Decision Making  Imaging: MRI of cervical spine dated 01/18/22 is poor quality. Report differs significantly from MRI 12/14/21.   MRI thoracic  spine dated 01/18/22:  No thoracic spinal canal stenosis. There is a medium-sized left subarticular disc protrusion at T11-12.   MRI of cervical spine dated 12/14/21:  FINDINGS: Alignment: Stable cervical lordosis since 2018. Subtle anterolisthesis of C4 on C5 and C5 on C6 is less apparent than on the CT last month.   Vertebrae: No marrow edema or evidence of acute osseous abnormality. Visualized bone marrow signal is within normal limits.   Cord: No cervical spinal cord signal abnormality despite up to mild degenerative cord mass effect detailed below. Negative visible upper thoracic spinal canal and cord.   Posterior Fossa, vertebral arteries, paraspinal tissues: Cervicomedullary junction is within normal limits. Negative visible posterior fossa. Preserved major vascular flow voids in the neck. Negative visible neck soft tissues and lung apices.   Disc levels:   C2-C3:  Mild facet hypertrophy on the left is chronic.  No stenosis.   C3-C4: Chronic mild to moderate facet hypertrophy on the left, with stable mild left C4 foraminal stenosis.   C4-C5: Mild circumferential disc bulge, mild to moderate facet and ligament flavum hypertrophy. Borderline chronic spinal stenosis. No convincing cord mass effect. Moderate to severe left and moderate right C5 foraminal stenosis does appear progressed since 2018.   C5-C6: Circumferential disc bulge with moderate to severe facet and ligament flavum hypertrophy. Broad-based posterior component of disc with mild spinal stenosis and mild if any cord mass effect does appear increased. Severe left and moderate right C6 foraminal stenosis appears progressed since 2018.   C6-C7: Mild disc bulging and facet hypertrophy. No significant stenosis.   C7-T1: Chronic midline small disc protrusion or posterior longitudinal ligament thickening. Mild facet hypertrophy. No significant stenosis. This level is stable.   IMPRESSION: 1.  No acute osseous  abnormality identified. 2. Progressed chronic disc and posterior element degeneration since a 2018 MRI at both C4-C5 and C5-C6. Mild spinal stenosis with possible mild cord mass effect at the latter. Increased moderate to severe left and moderate right C5 and C6 nerve level foraminal stenosis. 3. Other cervical levels are stable and without significant stenosis.     Electronically Signed   By: Genevie Ann M.D.   On: 12/17/2021 06:22   I have personally reviewed the images and agree with the above interpretation.  I noted the difference in reports from cervical spine MRIs dated 12/14/21 and 01/18/22. Message to radiology to clarify. See below:   MRI of cervical and thoracic spine dated 01/18/22:  ADDENDUM REPORT: 03/08/2022 12:13   ADDENDUM: Further review with direct comparison to 12/14/2021.   C4-5: There is no spinal canal stenosis. Difference compared to the prior study may be due to positioning. The neural foramina are difficult to assess because of the degree of motion. There is left facet hypertrophy contributing to left foraminal narrowing that is likely moderate. Right foraminal narrowing is mild.   C5-6: There is no spinal canal stenosis. Degree of motion obscures the neural foramina but the appearance is unchanged. There is left-greater-than-right facet hypertrophy with moderate bilateral neural foraminal stenosis.   There is no significant change from 12/14/2021. Lack spinal canal stenosis on the current examination may be due to differences in technique or positioning.     Electronically Signed   By: Ulyses Jarred M.D.   On: 03/08/2022 12:13   Assessment and Plan: Ms. Vasallo is a pleasant 71 y.o. female with constant posterior neck pain that varies in severity. She has intermittent bilateral arm pain into her hands.   She has  known cervical spondylosis with mild central stenosis C4-C6 and moderate severe left foraminal stenosis C4-C5 with severe left foraminal  stenosis C5-C6.   Neck pain likely multifactorial and due to cervical spondylsis, component is likley myofascial, and also from bilateral shoulder replacements. Arm pain may be due to foraminal stenosis on left.   Treatment options discussed with patient and following plan made:   - Agree with referral to physiatry for possible injections. She was given the number at [facility] to call for an appointment.  - Discussed formal PT for her neck as she likely has a muscular component. She wants to hold off.  - Will follow up with radiology regarding MRI reports. Report was edited- see above.  - She would like to follow up with [facility] at [facility]. Will follow up with me prn.   Of note, her blood pressure was initially 166/104, then 182/83. No CP, SOB, blurry vision, or headaches. Recheck at end of visit was 162/90.   She states her blood pressure is routinely high at doctors visits. She checks at home and it is generally around 120-130/60s.   She will recheck her blood pressure at home and call PCP with any issues. She will go to ED if she develops any of the above symptoms.   I spent a total of 30 minutes in face-to-face and non-face-to-face activities related to this patient's care toda including review of outside records, review of imaging, review of symptoms, physical exam, discussion of differential diagnosis, discussion of treatment options, and documentation.   Thank you for involving me in the care of this patient.   Geronimo Boot PA-C Dept. of Neurosurgery

## 2022-03-05 ENCOUNTER — Encounter: Payer: Self-pay | Admitting: Orthopedic Surgery

## 2022-03-05 ENCOUNTER — Ambulatory Visit (INDEPENDENT_AMBULATORY_CARE_PROVIDER_SITE_OTHER): Payer: Medicare Other | Admitting: Orthopedic Surgery

## 2022-03-05 VITALS — BP 162/90 | HR 65 | Ht 62.0 in | Wt 156.0 lb

## 2022-03-05 DIAGNOSIS — M4722 Other spondylosis with radiculopathy, cervical region: Secondary | ICD-10-CM | POA: Diagnosis not present

## 2022-03-05 DIAGNOSIS — M7918 Myalgia, other site: Secondary | ICD-10-CM | POA: Diagnosis not present

## 2022-03-05 DIAGNOSIS — M47812 Spondylosis without myelopathy or radiculopathy, cervical region: Secondary | ICD-10-CM

## 2022-03-05 DIAGNOSIS — M5412 Radiculopathy, cervical region: Secondary | ICD-10-CM

## 2022-03-05 NOTE — Patient Instructions (Signed)
It was so nice to see you today, I am sorry that you are hurting so much.   You have some wear and tear in your neck. This is likely what is causing your neck pain. Some of your pain is likely muscular as well.   I agree with referral to physiatry for possible injections. Their number is [phone]. Call them for an appointment.   Your blood pressure was high. Check it at home and call your PCP with any issues. If you have headache, chest pain, blurry vision, or shortness of breath then go to the emergency room.   Okay to follow up with [facility] and physiatry for your neck. If you need anything, please call me.  Geronimo Boot PA-C [phone]

## 2022-03-06 NOTE — Telephone Encounter (Signed)
Called and spoke to pt and she stated that she is out of her GERD medicine. I called the pharmacy and they said the directions says 1 a day but her GI dr told her to take 2 times a days is why she is out of it. Is there anything you can do?

## 2022-03-06 NOTE — Telephone Encounter (Signed)
Patient called in and stated that the pharmacy haven't received this refill. Informed patient that the pharmacy confirmed the RX on 03/04/2022. She was wanting to know if the prescription could be sent over again. Thank you!

## 2022-03-07 NOTE — Telephone Encounter (Signed)
Sydney Evans was last time she saw GI? Last visit back in Jan with Gi I saw said once daily not twice daily. Pt would have to call GI to verify and if she regularly sees them for this they can send in appropriate dosage.

## 2022-03-07 NOTE — Telephone Encounter (Signed)
Called and spoke to pt, she said she seen the GI dr when she was in the hospital. I did advise her to reach out to them about the RX.

## 2022-03-19 ENCOUNTER — Telehealth: Payer: Self-pay | Admitting: Family

## 2022-03-19 NOTE — Telephone Encounter (Signed)
Left message for patient to schedule Annual Wellness Visit.  Please schedule with Nurse Health Advisor Denisa O'Brien-Blaney, LPN. This appt can be telephone visit.  Please call [phone] ask for [facility]

## 2022-03-26 DIAGNOSIS — I739 Peripheral vascular disease, unspecified: Secondary | ICD-10-CM | POA: Insufficient documentation

## 2022-03-26 NOTE — Progress Notes (Unsigned)
Cardiology Office Note  Date:  03/27/2022   ID:  [name], DOB 1951-02-19, MRN [id number]  PCP:  Eugenia Pancoast, FNP   Chief Complaint  Patient presents with   New Patient (Initial Visit)    Ref by Eugenia Pancoast, FNP for heart murmur & bradycardia. Medications reviewed by the patient verbally.     HPI:  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 Delana Meyer Percutaneous transluminal angioplasty and stent placement right SFA to 6 mm             Percutaneous transluminal angioplasty right anterior tibial to 2 mm Percutaneous transluminal angioplasty right tibioperoneal trunk and the origin of the peroneal to 2.5 mm in the distal peroneal to 2 mm  Amputation at [facility] s/p RBKA 07/07/21   Echocardiogram completed 2021 Normal ejection fraction EF 60 to 65% No significant valve or heart disease  Having cervical disk disease,  Nerve pain b/;  Work-up at [employer] [facility] [employer] [facility] [employer], U/s Left arm:  No significant arterial obstruction detected proximal to the wrist in the left upper extremity.   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), Anemia, Anxiety, Arthritis, Barrett's esophagus, Bipolar disorder ([place]), Blocked artery, Blood clot in vein, Cancer ([place]), Carotid arterial disease ([place]), Contracture of joint of upper arm, Critical limb ischemia of right lower  extremity ([place]) (06/21/2021), Depression, Dislocation of right shoulder joint (10/06/2019), Disorder of bone and articular cartilage (09/29/2014), Elevated lipids, GERD (gastroesophageal reflux disease), Hypertension, Migraine, Osteoporosis, 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  PSH:    Past Surgical History:  Procedure Laterality Date   ABDOMINAL HYSTERECTOMY     pt states might still have ovaries   bka right     BREAST BIOPSY Left    CARPAL TUNNEL RELEASE Bilateral    CATARACT EXTRACTION W/ INTRAOCULAR LENS  IMPLANT, BILATERAL Bilateral    CHOLECYSTECTOMY     COLONOSCOPY WITH PROPOFOL N/A 04/09/2018   Procedure: COLONOSCOPY WITH PROPOFOL;  Surgeon: Jonathon Bellows, MD;  Location: [facility] ENDOSCOPY;  Service: Gastroenterology;  Laterality: N/A;   crystal cyst removed on left foot     ESOPHAGOGASTRODUODENOSCOPY (EGD) WITH PROPOFOL N/A 04/09/2018   Procedure: ESOPHAGOGASTRODUODENOSCOPY (EGD) WITH PROPOFOL;  Surgeon: Jonathon Bellows, MD;  Location: [facility] ENDOSCOPY;  Service: Gastroenterology;  Laterality: N/A;   ESOPHAGOGASTRODUODENOSCOPY (EGD) WITH PROPOFOL N/A 12/14/2019   Procedure: ESOPHAGOGASTRODUODENOSCOPY (EGD) WITH PROPOFOL;  Surgeon: Lesly Rubenstein, MD;  Location: [facility] ENDOSCOPY;  Service: Endoscopy;  Laterality: N/A;   EYE SURGERY     femoral fx     FOOT SURGERY     GIVENS CAPSULE STUDY N/A 06/16/2018   Procedure: GIVENS CAPSULE STUDY;  Surgeon: Jonathon Bellows, MD;  Location[facility] ENDOSCOPY;  Service: Gastroenterology;  Laterality: N/A;  JOINT REPLACEMENT     KNEE SURGERY     KYPHOPLASTY     LOWER EXTREMITY ANGIOGRAPHY Right 06/21/2021   Procedure: Lower Extremity  Angiography;  Surgeon: Katha Cabal, MD;  Location: [place] CV LAB;  Service: Cardiovascular;  Laterality: Right;   ORIF PERIPROSTHETIC FRACTURE Right 10/07/2019   Procedure: OPEN REDUCTION INTERNAL FIXATION (ORIF) PERIPROSTHETIC FRACTURE;  Surgeon: Corky Mull, MD;  Location: [facility] ORS;  Service: Orthopedics;  Laterality: Right;   TONSILLECTOMY     TOTAL KNEE ARTHROPLASTY Bilateral    TOTAL SHOULDER ARTHROPLASTY Left 08/01/2016   Procedure: TOTAL SHOULDER ARTHROPLASTY;  Surgeon: Corky Mull, MD;  Location: [facility] ORS;  Service: Orthopedics;  Laterality: Left;   TOTAL SHOULDER ARTHROPLASTY Right 09/30/2019   Procedure: TOTAL SHOULDER ARTHROPLASTY;  Surgeon: Corky Mull, MD;  Location: [facility] ORS;  Service: Orthopedics;  Laterality: Right;    Current Outpatient Medications  Medication Sig Dispense Refill   acetaminophen (TYLENOL) 500 MG tablet Take 500 mg by mouth every 6 (six) hours as needed.     amLODipine (NORVASC) 5 MG tablet Take 1 tablet (5 mg total) by mouth daily. 30 tablet 11   Carboxymethylcellulose Sodium (LUBRICANT EYE DROPS OP) Place 1 drop into both eyes daily as needed (Dry eye). Systane     citalopram (CELEXA) 40 MG tablet Take 1 tablet (40 mg total) by mouth daily. 90 tablet 0   diphenhydrAMINE (BENADRYL) 25 MG tablet Take 25 mg by mouth every 6 (six) hours as needed for itching.     fluticasone (FLONASE) 50 MCG/ACT nasal spray Place 2 sprays into both nostrils daily as needed for allergies or rhinitis. 16 g 12   gabapentin (NEURONTIN) 300 MG capsule Take 1 capsule (300 mg total) by mouth 4 (four) times daily. 360 capsule 3   ibuprofen (ADVIL) 800 MG tablet Take 800 mg by mouth every 8 (eight) hours as needed.     lamoTRIgine (LAMICTAL) 200 MG tablet Take 1 tablet (200 mg total) by mouth daily. 90 tablet 0   methocarbamol (ROBAXIN) 500 MG tablet Take 500 mg by mouth 3 (three) times daily.     omeprazole (PRILOSEC) 40 MG capsule Take 1 capsule (40 mg total) by mouth  daily before breakfast. Take 30 minutes before breakfast. 90 capsule 1   ARIPiprazole (ABILIFY) 2 MG tablet Take 1 tablet (2 mg total) by mouth at bedtime. (Patient not taking: Reported on 03/27/2022) 30 tablet 1   clotrimazole-betamethasone (LOTRISONE) cream Apply 1 application. topically daily. (Patient not taking: Reported on 03/27/2022) 30 g 0   propranolol (INDERAL) 20 MG tablet Take 20 mg by mouth 3 (three) times daily. (Patient not taking: Reported on 03/05/2022)     No current facility-administered medications for this visit.     Allergies:   Hydroxyzine, Tramadol, Dilaudid [hydromorphone], Hydrocodone, Ketorolac, Percocet [oxycodone-acetaminophen], and Toradol [ketorolac tromethamine]   Social History:  The patient  reports that she has been smoking cigarettes. She has been smoking an average of .5 packs per day. She has never used smokeless tobacco. She reports that she does not drink alcohol and does not use drugs.   Family History:   family history includes Alcohol abuse in her father; Arthritis in her father; Bradycardia in her sister; CAD in her father; Cancer in her mother; Heart murmur in her brother and sister; High Cholesterol in her father, mother, and another family member; High blood pressure in an other family member; Other in her mother.    Review of Systems:  Review of Systems  Constitutional: Negative.   HENT: Negative.    Respiratory: Negative.    Cardiovascular: Negative.   Gastrointestinal: Negative.   Musculoskeletal: Negative.   Neurological: Negative.   Psychiatric/Behavioral: Negative.    All other systems reviewed and are negative.    PHYSICAL EXAM: VS:  BP (!) 140/70 (BP Location: Left Arm, Patient Position: Sitting, Cuff Size: Normal)   Pulse (!) 56   Ht '5\' 1"'$  (1.549 m)   SpO2 98%   BMI 29.48 kg/m  , BMI Body mass index is 29.48 kg/m. GEN: Well nourished, well developed, in no acute distress HEENT: normal Neck: no JVD, carotid bruits, or  masses Cardiac: RRR; no murmurs, rubs, or gallops,no edema  Respiratory:  clear to auscultation bilaterally, normal work of breathing GI: soft, nontender, nondistended, + BS MS: no deformity or atrophy, amputation right leg below the knee Skin: warm and dry, no rash Neuro:  Strength and sensation are intact Psych: euthymic mood, full affect   Recent Labs: 06/21/2021: ALT 9; TSH 4.295 06/25/2021: Magnesium 1.8 01/18/2022: BUN 13; Creatinine, Ser 0.78; Hemoglobin 9.9; Platelets 397; Potassium 4.0; Sodium 138    Lipid Panel Lab Results  Component Value Date   CHOL 221 (H) 03/17/2020   HDL 59 03/17/2020   LDLCALC 150 (H) 03/17/2020   TRIG 69 03/17/2020      Wt Readings from Last 3 Encounters:  03/05/22 156 lb (70.8 kg)  11/27/21 147 lb (66.7 kg)  11/07/21 149 lb 14.6 oz (68 kg)      ASSESSMENT AND PLAN:  Problem List Items Addressed This Visit       Cardiology Problems   PAD (peripheral artery disease) ([place]) - Primary   Critical limb ischemia of right lower extremity ([place])   Essential hypertension   Hyperlipidemia   Carotid stenosis   Referred for murmur No appreciable murmur appreciated on exam Echocardiogram June 2021, no significant valve pathology noted No further work-up needed at this time  Asymptomatic bradycardia Reports heart rate sometimes down into the 40s, states that she is asymptomatic Currently not on medications that would slow her heart rate Stable blood pressure with no symptoms concerning for orthostasis No near-syncope or syncope No indication for pacemaker or further evaluation at this time  Peripheral arterial disease Smoking cessation recommended Various strategies discussed with her, she reports that she is slowly decreasing her smoking but still approximately 1 pack/day  Hyperlipidemia Currently on Lipitor 40 daily Recommend repeat lipid panel on next routine lab visit Goal LDL less than 70 If above goal, could add Zetia 10 mg  daily    Total encounter time more than 50 minutes  Greater than 50% was spent in counseling and coordination of care with the patient    Signed, Esmond Plants, M.D., Ph.D. [place], [place]

## 2022-03-27 ENCOUNTER — Encounter: Payer: Self-pay | Admitting: Cardiovascular Disease

## 2022-03-27 ENCOUNTER — Ambulatory Visit: Payer: Medicare Other | Attending: Cardiovascular Disease | Admitting: Cardiovascular Disease

## 2022-03-27 VITALS — BP 140/70 | HR 56 | Ht 61.0 in

## 2022-03-27 DIAGNOSIS — I10 Essential (primary) hypertension: Secondary | ICD-10-CM | POA: Diagnosis not present

## 2022-03-27 DIAGNOSIS — I4729 Other ventricular tachycardia: Secondary | ICD-10-CM | POA: Diagnosis not present

## 2022-03-27 DIAGNOSIS — I739 Peripheral vascular disease, unspecified: Secondary | ICD-10-CM

## 2022-03-27 DIAGNOSIS — I6523 Occlusion and stenosis of bilateral carotid arteries: Secondary | ICD-10-CM | POA: Diagnosis not present

## 2022-03-27 DIAGNOSIS — E782 Mixed hyperlipidemia: Secondary | ICD-10-CM

## 2022-03-27 DIAGNOSIS — I70221 Atherosclerosis of native arteries of extremities with rest pain, right leg: Secondary | ICD-10-CM | POA: Diagnosis not present

## 2022-03-27 NOTE — Patient Instructions (Signed)
Medication Instructions:  No changes  If you need a refill on your cardiac medications before your next appointment, please call your pharmacy.   Lab work: No new labs needed  Testing/Procedures: No new testing needed  Follow-Up: At [facility], you and your health needs are our priority.  As part of our continuing mission to provide you with exceptional heart care, we have created designated Provider Care Teams.  These Care Teams include your primary Cardiologist (physician) and Advanced Practice Providers (APPs -  Physician Assistants and Nurse Practitioners) who all work together to provide you with the care you need, when you need it.  You will need a follow up appointment as needed  Providers on your designated Care Team:   Christopher Berge, NP Ryan Dunn, PA-C Cadence Furth, PA-C  COVID-19 Vaccine Information can be found at: [email or web address] For questions related to vaccine distribution or appointments, please email [email or web address] or call [phone].    

## 2022-04-01 ENCOUNTER — Encounter: Payer: Self-pay | Admitting: Physical Medicine & Rehabilitation

## 2022-04-03 ENCOUNTER — Other Ambulatory Visit: Payer: Self-pay | Admitting: Physical Medicine & Rehabilitation

## 2022-04-03 DIAGNOSIS — M542 Cervicalgia: Secondary | ICD-10-CM

## 2022-04-03 DIAGNOSIS — M5412 Radiculopathy, cervical region: Secondary | ICD-10-CM

## 2022-04-04 ENCOUNTER — Encounter: Payer: Self-pay | Admitting: Oncology

## 2022-04-10 ENCOUNTER — Ambulatory Visit
Admission: RE | Admit: 2022-04-10 | Discharge: 2022-04-10 | Disposition: A | Discharge status: Home / Self Care | Payer: Medicare Other | Source: Ambulatory Visit | Attending: Physical Medicine & Rehabilitation | Admitting: Physical Medicine & Rehabilitation

## 2022-04-10 DIAGNOSIS — M542 Cervicalgia: Secondary | ICD-10-CM

## 2022-04-10 DIAGNOSIS — M5412 Radiculopathy, cervical region: Secondary | ICD-10-CM

## 2022-04-10 MED ORDER — TRIAMCINOLONE ACETONIDE 40 MG/ML IJ SUSP (RADIOLOGY)
60.0000 mg | Freq: Once | INTRAMUSCULAR | Status: AC
  Administered 2022-04-10: 60 mg via EPIDURAL

## 2022-04-10 MED ORDER — IOPAMIDOL (ISOVUE-M 300) INJECTION 61%
1.0000 mL | Freq: Once | INTRAMUSCULAR | Status: AC
  Administered 2022-04-10: 1 mL via EPIDURAL

## 2022-04-10 NOTE — Discharge Instructions (Signed)

## 2022-04-12 ENCOUNTER — Telehealth: Payer: Self-pay | Admitting: Family

## 2022-04-12 NOTE — Telephone Encounter (Signed)
Home Health verbal orders Caller Name: connie Agency Name: [facility]  Callback number: [phone], secured (can leave vm)  Requesting OT/PT/Skilled nursing/Social Work/Speech: OT eval, hh aid to assist with bathing, re certification   Reason:  Frequency:   Please forward to [facility] pool or providers CMA

## 2022-04-12 NOTE — Telephone Encounter (Signed)
Verbal orders given  

## 2022-04-12 NOTE — Telephone Encounter (Signed)
Home Health verbal orders [place] Name:Connie Agency Name: [place] number: [phone]  Requesting OT/PT/Skilled nursing/Social Work/Speech:OT  Reason:  Frequency:1 week for 7 weeks, Home Health aid 1 a week for 6 weeks  Please forward to [facility] pool or providers CMA

## 2022-04-18 ENCOUNTER — Ambulatory Visit: Payer: Self-pay | Admitting: Psychiatry
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
## 2022-04-22 ENCOUNTER — Ambulatory Visit (INDEPENDENT_AMBULATORY_CARE_PROVIDER_SITE_OTHER): Payer: Medicare Other | Admitting: Psychiatry

## 2022-04-22 ENCOUNTER — Other Ambulatory Visit: Payer: Self-pay | Admitting: Psychiatry

## 2022-04-22 ENCOUNTER — Encounter: Payer: Self-pay | Admitting: Psychiatry

## 2022-04-22 VITALS — BP 169/68 | HR 52 | Temp 98.7°F | Ht 61.0 in

## 2022-04-22 DIAGNOSIS — F4310 Post-traumatic stress disorder, unspecified: Secondary | ICD-10-CM

## 2022-04-22 DIAGNOSIS — F319 Bipolar disorder, unspecified: Secondary | ICD-10-CM | POA: Diagnosis not present

## 2022-04-22 MED ORDER — LAMOTRIGINE 200 MG PO TABS: 200.0000 mg | ORAL_TABLET | Freq: Every day | ORAL | 0 refills | Status: DC

## 2022-04-22 MED ORDER — ARIPIPRAZOLE 5 MG PO TABS: 5.0000 mg | ORAL_TABLET | Freq: Every day | ORAL | 1 refills | Status: DC

## 2022-04-22 MED ORDER — CITALOPRAM HYDROBROMIDE 40 MG PO TABS: 40.0000 mg | ORAL_TABLET | Freq: Every day | ORAL | 0 refills | Status: DC

## 2022-04-22 NOTE — Patient Instructions (Signed)
Continue citalopram 40 mg daily  Continue lamotrigine 200 mg daily  Start Abilify 5 mg at night  Next appointment: 2/6 at 8:30

## 2022-04-23 ENCOUNTER — Other Ambulatory Visit: Payer: Self-pay | Admitting: Psychiatry

## 2022-05-02 ENCOUNTER — Telehealth: Payer: Self-pay | Admitting: Family

## 2022-05-02 NOTE — Telephone Encounter (Signed)
[facility] called in to report a missed visit today 05/02/22,patient wasn't feeling well.

## 2022-05-02 NOTE — Telephone Encounter (Signed)
Noted  

## 2022-05-10 ENCOUNTER — Other Ambulatory Visit: Payer: Self-pay

## 2022-05-10 ENCOUNTER — Emergency Department: Payer: Medicare Other

## 2022-05-10 ENCOUNTER — Emergency Department
Admission: EM | Admit: 2022-05-10 | Discharge: 2022-05-10 | Disposition: A | Discharge status: Home / Self Care | Payer: Medicare Other | Attending: Emergency Medicine | Admitting: Emergency Medicine

## 2022-05-10 ENCOUNTER — Telehealth: Payer: Self-pay | Admitting: Family

## 2022-05-10 DIAGNOSIS — Z1152 Encounter for screening for COVID-19: Secondary | ICD-10-CM | POA: Diagnosis not present

## 2022-05-10 DIAGNOSIS — D649 Anemia, unspecified: Secondary | ICD-10-CM | POA: Diagnosis not present

## 2022-05-10 DIAGNOSIS — R112 Nausea with vomiting, unspecified: Secondary | ICD-10-CM | POA: Diagnosis not present

## 2022-05-10 DIAGNOSIS — I700 Atherosclerosis of aorta: Secondary | ICD-10-CM | POA: Insufficient documentation

## 2022-05-10 DIAGNOSIS — M5116 Intervertebral disc disorders with radiculopathy, lumbar region: Secondary | ICD-10-CM | POA: Insufficient documentation

## 2022-05-10 DIAGNOSIS — E876 Hypokalemia: Secondary | ICD-10-CM | POA: Diagnosis not present

## 2022-05-10 DIAGNOSIS — K5730 Diverticulosis of large intestine without perforation or abscess without bleeding: Secondary | ICD-10-CM | POA: Diagnosis not present

## 2022-05-10 DIAGNOSIS — I10 Essential (primary) hypertension: Secondary | ICD-10-CM | POA: Insufficient documentation

## 2022-05-10 DIAGNOSIS — I119 Hypertensive heart disease without heart failure: Secondary | ICD-10-CM | POA: Diagnosis not present

## 2022-05-10 DIAGNOSIS — R1084 Generalized abdominal pain: Secondary | ICD-10-CM | POA: Insufficient documentation

## 2022-05-10 DIAGNOSIS — R197 Diarrhea, unspecified: Secondary | ICD-10-CM | POA: Diagnosis not present

## 2022-05-10 DIAGNOSIS — M4726 Other spondylosis with radiculopathy, lumbar region: Secondary | ICD-10-CM | POA: Diagnosis not present

## 2022-05-10 DIAGNOSIS — R109 Unspecified abdominal pain: Secondary | ICD-10-CM | POA: Diagnosis present

## 2022-05-10 DIAGNOSIS — Z8673 Personal history of transient ischemic attack (TIA), and cerebral infarction without residual deficits: Secondary | ICD-10-CM | POA: Diagnosis not present

## 2022-05-10 LAB — COMPREHENSIVE METABOLIC PANEL
ALT: 21 U/L (ref 0–44)
AST: 32 U/L (ref 15–41)
Albumin: 3.5 g/dL (ref 3.5–5.0)
Alkaline Phosphatase: 64 U/L (ref 38–126)
Anion gap: 10 (ref 5–15)
BUN: 19 mg/dL (ref 8–23)
CO2: 21 mmol/L — ABNORMAL LOW (ref 22–32)
Calcium: 8.3 mg/dL — ABNORMAL LOW (ref 8.9–10.3)
Chloride: 105 mmol/L (ref 98–111)
Creatinine, Ser: 0.76 mg/dL (ref 0.44–1.00)
GFR, Estimated: >60 mL/min (ref >60)
Glucose, Bld: 90 mg/dL (ref 70–99)
Potassium: 3.3 mmol/L — ABNORMAL LOW (ref 3.5–5.1)
Sodium: 136 mmol/L (ref 135–145)
Total Bilirubin: 0.6 mg/dL (ref 0.3–1.2)
Total Protein: 7.1 g/dL (ref 6.5–8.1)

## 2022-05-10 LAB — CBC
HCT: 33.8 % — ABNORMAL LOW (ref 36.0–46.0)
Hemoglobin: 9.8 g/dL — ABNORMAL LOW (ref 12.0–15.0)
MCH: 22.3 pg — ABNORMAL LOW (ref 26.0–34.0)
MCHC: 29 g/dL — ABNORMAL LOW (ref 30.0–36.0)
MCV: 77 fL — ABNORMAL LOW (ref 80.0–100.0)
Platelets: 322 10*3/uL (ref 150–400)
RBC: 4.39 MIL/uL (ref 3.87–5.11)
RDW: 18.9 % — ABNORMAL HIGH (ref 11.5–15.5)
WBC: 3.4 10*3/uL — ABNORMAL LOW (ref 4.0–10.5)
nRBC: 0 % (ref 0.0–0.2)

## 2022-05-10 LAB — LIPASE, BLOOD: Lipase: 31 U/L (ref 11–51)

## 2022-05-10 LAB — URINALYSIS, ROUTINE W REFLEX MICROSCOPIC
Bacteria, UA: NONE SEEN
Bilirubin Urine: NEGATIVE
Glucose, UA: NEGATIVE mg/dL
Ketones, ur: NEGATIVE mg/dL
Leukocytes,Ua: NEGATIVE
Nitrite: NEGATIVE
Protein, ur: 30 mg/dL — AB
Specific Gravity, Urine: 1.029 (ref 1.005–1.030)
pH: 6 (ref 5.0–8.0)

## 2022-05-10 LAB — RESP PANEL BY RT-PCR (RSV, FLU A&B, COVID)  RVPGX2
Influenza A by PCR: NEGATIVE
Influenza B by PCR: NEGATIVE
Resp Syncytial Virus by PCR: NEGATIVE
SARS Coronavirus 2 by RT PCR: NEGATIVE

## 2022-05-10 MED ORDER — DIPHENOXYLATE-ATROPINE 2.5-0.025 MG PO TABS: 1.0000 | ORAL_TABLET | Freq: Two times a day (BID) | ORAL | 0 refills | Status: DC | PRN

## 2022-05-10 MED ORDER — IOHEXOL 300 MG/ML  SOLN
100.0000 mL | Freq: Once | INTRAMUSCULAR | Status: AC | PRN
  Administered 2022-05-10: 100 mL via INTRAVENOUS

## 2022-05-10 MED ORDER — LACTATED RINGERS IV BOLUS
1000.0000 mL | Freq: Once | INTRAVENOUS | Status: AC
  Administered 2022-05-10: 1000 mL via INTRAVENOUS

## 2022-05-10 NOTE — ED Provider Notes (Signed)
[facility] Provider Note    Event Date/Time   First MD Initiated Contact with Patient 05/10/22 1513     (approximate)   History   Abdominal Pain   HPI  Sydney Evans is a 72 y.o. female with significant past medical history of CVA, hypertension, limb ischemia with right BKA, bipolar, anxiety, and as listed in the EMR presents to the emergency department for treatment and evaluation of vomiting, abdominal pain, and diarrhea for the past 2 days. No known fever. No obvious blood in stool. No dysuria.      Physical Exam   Triage Vital Signs: ED Triage Vitals  Enc Vitals Group     BP 05/10/22 1143 (!) 165/55     Pulse Rate 05/10/22 1143 (!) 55     Resp 05/10/22 1143 20     Temp 05/10/22 1143 98.1 F (36.7 C)     Temp Source 05/10/22 1143 Oral     SpO2 05/10/22 1143 93 %     Weight 05/10/22 1148 154 lb 5.2 oz (70 kg)     Height --      Head Circumference --      Peak Flow --      Pain Score 05/10/22 1141 5     Pain Loc --      Pain Edu? --      Excl. in [place]? --     Most recent vital signs: Vitals:   05/10/22 1143 05/10/22 1831  BP: (!) 165/55 (!) 150/60  Pulse: (!) 55 60  Resp: 20 20  Temp: 98.1 F (36.7 C) 98.1 F (36.7 C)  SpO2: 93% 95%     General: Awake, no distress.  CV:  Good peripheral perfusion.  Resp:  Normal effort.  Abd:  No distention.  No focal abdominal tenderness. Other:     ED Results / Procedures / Treatments   Labs (all labs ordered are listed, but only abnormal results are displayed) Labs Reviewed  COMPREHENSIVE METABOLIC PANEL - Abnormal; Notable for the following components:      Result Value   Potassium 3.3 (*)    CO2 21 (*)    Calcium 8.3 (*)    All other components within normal limits  CBC - Abnormal; Notable for the following components:   WBC 3.4 (*)    Hemoglobin 9.8 (*)    HCT 33.8 (*)    MCV 77.0 (*)    MCH 22.3 (*)    MCHC 29.0 (*)    RDW 18.9 (*)    All other components within normal  limits  URINALYSIS, ROUTINE W REFLEX MICROSCOPIC - Abnormal; Notable for the following components:   Color, Urine YELLOW (*)    APPearance CLEAR (*)    Hgb urine dipstick SMALL (*)    Protein, ur 30 (*)    All other components within normal limits  RESP PANEL BY RT-PCR (RSV, FLU A&B, COVID)  RVPGX2  LIPASE, BLOOD     EKG  Not indicated   RADIOLOGY  CT abdomen and pelvis with contrast shows no acute findings.   PROCEDURES:  Critical Care performed: No  Procedures   MEDICATIONS ORDERED IN ED: Medications  lactated ringers bolus 1,000 mL (0 mLs Intravenous Stopped 05/10/22 1831)  iohexol (OMNIPAQUE) 300 MG/ML solution 100 mL (100 mLs Intravenous Contrast Given 05/10/22 1604)     IMPRESSION / MDM / ASSESSMENT AND PLAN / ED COURSE  I reviewed the triage vital signs and the nursing notes.  Differential diagnosis includes, but is not limited to, COVID, influenza, gastroenteritis, diverticulitis, mesenteric adenitis, colitis, infectious diarrhea  Patient's presentation is most consistent with acute presentation with potential threat to life or bodily function.  72 year old female presenting to the emergency department for treatment and evaluation of abdominal pain with diarrhea for the past 2 days.  No known fever.  See HPI for further details. Labs are reassuring.  She has a stable anemia.  Very mild hypokalemia.  Normal renal function.  Urinalysis without acute concerns for infection.  COVID, influenza, and RSV negative.  CT of the abdomen and pelvis is without acute findings.  Plan will be to discharge patient home with prescription for Lomotil.  She is to call and schedule follow-up appointment with her primary care provider.  If symptoms change or worsen and she is unable to see primary care, she is to return to the emergency department.      FINAL CLINICAL IMPRESSION(S) / ED DIAGNOSES   Final diagnoses:  Generalized abdominal pain   Diarrhea, unspecified type     Rx / DC Orders   ED Discharge Orders          Ordered    diphenoxylate-atropine (LOMOTIL) 2.5-0.025 MG tablet  2 times daily PRN        05/10/22 1723             Note:  This document was prepared using Dragon voice recognition software and may include unintentional dictation errors.   Victorino Dike, FNP 05/13/22 2334    Naaman Plummer, MD 05/14/22 2322

## 2022-05-10 NOTE — ED Triage Notes (Signed)
Pt comes with c/o vomiting, belly pain and diarrhea for two days.

## 2022-05-10 NOTE — Telephone Encounter (Signed)
Shawn PT from [facility] called in to make PCP aware that patient miss PT because of a stomach bug # [phone]

## 2022-05-10 NOTE — Discharge Instructions (Signed)
Stay well hydrated.  See primary care if not better in 2 days.  Return to the ER for symptoms that change or worsen if unable to schedule an appointment with primary care.

## 2022-05-13 ENCOUNTER — Telehealth: Payer: Self-pay | Admitting: *Deleted

## 2022-05-13 NOTE — Telephone Encounter (Signed)
Noted  

## 2022-05-13 NOTE — Telephone Encounter (Signed)
FYI pt in ER 05/10/2022 follow up scheduled 1/16 would like to get in sooner if possible

## 2022-05-13 NOTE — Telephone Encounter (Signed)
Agree with precautions given to pt  Agree with nurse assessment in plan.  Thank you for speaking with them. 

## 2022-05-13 NOTE — Telephone Encounter (Signed)
Patient is still having diarrhea on and off throughout the day. When she drinks or eats anything she gets diarrhea and abdominal pain. She is able to get saltine crackers and she is trying to drink water and chicken broth to stay hydrated. Offered her appointment tomorrow with Romilda Garret, she declined. She said she wants to wait till her follow up on 01/16. Advised she needs to go to [facility] or ER if her symptoms worsen and abdominal pain gets severe. Patient voiced understanding.

## 2022-05-13 NOTE — Telephone Encounter (Signed)
F/u scheduled 1/16

## 2022-05-13 NOTE — Telephone Encounter (Signed)
Transition Care Management Follow-up Telephone Call Date of discharge and from where: 05/10/2022 [facility]  How have you been since you were released from the hospital? Not doing any better  Any questions or concerns? Yes would like to see GI  Items Reviewed: Did the pt receive and understand the discharge instructions provided? Yes  Medications obtained and verified? Yes  Other? No  Any new allergies since your discharge? No  Dietary orders reviewed? Yes Do you have support at home? Yes    Functional Questionnaire: (I = Independent and D = Dependent) ADLs: i  Bathing/Dressing- i  Meal Prep- i  Eating- i  Maintaining continence- i  Transferring/Ambulation- i  Managing Meds- i  Follow up appointments reviewed:  PCP Hospital f/u appt confirmed? Yes  Scheduled to see Sydney Evans on 1/16 @ 2:20a. Are transportation arrangements needed? Yes  If their condition worsens, is the pt aware to call PCP or go to the Emergency Dept.? Yes Was the patient provided with contact information for the PCP's office or ED? Yes Was to pt encouraged to call back with questions or concerns? Yes

## 2022-05-20 ENCOUNTER — Telehealth: Payer: Self-pay | Admitting: Family

## 2022-05-20 NOTE — Telephone Encounter (Signed)
LVM for pt to rtn my call to schedule AWV with [facility] call back # [phone] 

## 2022-05-21 ENCOUNTER — Encounter: Payer: Self-pay | Admitting: Family

## 2022-05-21 ENCOUNTER — Ambulatory Visit (INDEPENDENT_AMBULATORY_CARE_PROVIDER_SITE_OTHER): Payer: 59 | Admitting: Family

## 2022-05-21 ENCOUNTER — Encounter: Payer: Self-pay | Admitting: Oncology

## 2022-05-21 ENCOUNTER — Ambulatory Visit: Payer: Medicare Other | Admitting: Family

## 2022-05-21 VITALS — BP 150/78 | HR 63 | Temp 98.6°F | Ht 61.0 in

## 2022-05-21 DIAGNOSIS — R142 Eructation: Secondary | ICD-10-CM

## 2022-05-21 DIAGNOSIS — K219 Gastro-esophageal reflux disease without esophagitis: Secondary | ICD-10-CM

## 2022-05-21 DIAGNOSIS — F419 Anxiety disorder, unspecified: Secondary | ICD-10-CM

## 2022-05-21 DIAGNOSIS — R197 Diarrhea, unspecified: Secondary | ICD-10-CM

## 2022-05-21 DIAGNOSIS — D649 Anemia, unspecified: Secondary | ICD-10-CM

## 2022-05-21 DIAGNOSIS — R5383 Other fatigue: Secondary | ICD-10-CM | POA: Diagnosis not present

## 2022-05-21 DIAGNOSIS — R531 Weakness: Secondary | ICD-10-CM | POA: Diagnosis not present

## 2022-05-21 MED ORDER — OMEPRAZOLE 20 MG PO CPDR: 20.0000 mg | DELAYED_RELEASE_CAPSULE | Freq: Two times a day (BID) | ORAL | 0 refills | Status: DC

## 2022-05-21 NOTE — Patient Instructions (Addendum)
  Stop by the lab prior to leaving today. I will notify you of your results once received.   Start taking omeprazole 20 mg twice daily.   Please call your GI doctor and please try to get in with them as soon as able for evaluation.    Regards,   Eugenia Pancoast FNP-C

## 2022-05-21 NOTE — Progress Notes (Signed)
Established Patient Office Visit  Subjective:  Patient ID: Sydney Evans, female    DOB: 05/30/1950  Age: 72 y.o. MRN: [id number]  CC:  Chief Complaint  Patient presents with   Nausea    Seen in ED 2 weeks ago     HPI Nicloe Frontera is here for hospital follow up.   Hospital: [facility] went 1/5 for c/o vomiting abd pain and diarrhea. Ct abd pelvis with contrast with no acute findings.  Admit:  Discharge: Discharge dx: gen abd pain , diarrhea Discharge medications:lomotil   CT in ER mild cardiomegaly. Aortic vascular calcification. 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   Cbc reviewed, low wbc however stable anemia.  Hypokalemia 3.3 in hospital with low calcium.   Acute concerns:  Still with ongoing diarrhea, still with bouts 3-4 times a day. Lomotil was helping, but she ran out about a week ago, and diarrhea has worsened again. Diarrhea comes and goes, some days doesn't have diarrhea. Mushy stool at times. The color is tannish in nature. Still with some mild nausea. She states ER advised her to increase omeprazole twice daily but she can not remember if 20 mg or 40 mg.   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   Carotid arterial disease ([place])    Contracture of joint of upper arm    Critical limb ischemia of right lower extremity ([place]) 06/21/2021   Depression    Dislocation of right shoulder joint 10/06/2019   Disorder of bone and articular cartilage 09/29/2014   Elevated lipids    GERD (gastroesophageal reflux disease)    Hypertension    Migraine    Osteoporosis    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   Poor balance    Rotator cuff tendinitis, left 05/20/2016   Sinus congestion    Status post total shoulder arthroplasty, 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   Status post total shoulder replacement, 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   Stroke ([place])    x 2   TIA (transient ischemic attack)    Traumatic complete tear of right rotator cuff 10/08/2019   Vertigo     Past Surgical History:  Procedure Laterality Date   ABDOMINAL HYSTERECTOMY     pt states might still have ovaries   bka right     BREAST BIOPSY Left    CARPAL TUNNEL RELEASE Bilateral    CATARACT EXTRACTION W/ INTRAOCULAR LENS  IMPLANT, BILATERAL Bilateral    CHOLECYSTECTOMY     COLONOSCOPY WITH PROPOFOL N/A 04/09/2018   Procedure: COLONOSCOPY WITH PROPOFOL;  Surgeon: Jonathon Bellows, MD;  Location: [facility] ENDOSCOPY;  Service: Gastroenterology;  Laterality: N/A;   crystal cyst removed on left foot     ESOPHAGOGASTRODUODENOSCOPY (EGD) WITH PROPOFOL N/A 04/09/2018   Procedure: ESOPHAGOGASTRODUODENOSCOPY (EGD) WITH PROPOFOL;  Surgeon: Jonathon Bellows, MD;  Location: [facility] ENDOSCOPY;  Service: Gastroenterology;  Laterality: N/A;   ESOPHAGOGASTRODUODENOSCOPY (EGD) WITH PROPOFOL N/A 12/14/2019   Procedure: ESOPHAGOGASTRODUODENOSCOPY (EGD) WITH PROPOFOL;  Surgeon: Lesly Rubenstein, MD;  Location: [facility] ENDOSCOPY;  Service: Endoscopy;  Laterality: N/A;   EYE SURGERY     femoral fx     FOOT SURGERY     GIVENS CAPSULE STUDY N/A 06/16/2018  Procedure: GIVENS CAPSULE STUDY;  Surgeon: Jonathon Bellows, MD;  Location: [facility] ENDOSCOPY;  Service: Gastroenterology;  Laterality: N/A;   JOINT REPLACEMENT     KNEE SURGERY     KYPHOPLASTY     LOWER EXTREMITY ANGIOGRAPHY Right 06/21/2021   Procedure: Lower Extremity Angiography;  Surgeon: Katha Cabal, MD;  Location: [place] CV LAB;  Service: Cardiovascular;  Laterality: Right;   ORIF PERIPROSTHETIC FRACTURE Right 10/07/2019   Procedure: OPEN REDUCTION INTERNAL FIXATION (ORIF) PERIPROSTHETIC FRACTURE;  Surgeon: Corky Mull, MD;  Location: [facility] ORS;  Service: Orthopedics;  Laterality: Right;   TONSILLECTOMY     TOTAL KNEE ARTHROPLASTY Bilateral    TOTAL  SHOULDER ARTHROPLASTY Left 08/01/2016   Procedure: TOTAL SHOULDER ARTHROPLASTY;  Surgeon: Corky Mull, MD;  Location: [facility] ORS;  Service: Orthopedics;  Laterality: Left;   TOTAL SHOULDER ARTHROPLASTY Right 09/30/2019   Procedure: TOTAL SHOULDER ARTHROPLASTY;  Surgeon: Corky Mull, MD;  Location: [facility] ORS;  Service: Orthopedics;  Laterality: Right;    Family History  Problem Relation Age of Onset   Other Mother        ?lupus    Cancer Mother    High Cholesterol Mother    Alcohol abuse Father    CAD Father        CABG   High Cholesterol Father    Arthritis Father    Heart murmur Sister    Bradycardia Sister    Heart murmur Brother    High blood pressure Other        "for everybody"   High Cholesterol Other        "for everybody"    Social History   Socioeconomic History   Marital status: Single    Spouse name: Not on file   Number of children: 3   Years of education: Not on file   Highest education level: Some college, no degree  Occupational History   Not on file  Tobacco Use   Smoking status: Every Day    Packs/day: 0.50    Types: Cigarettes    Last attempt to quit: 08/04/2016    Years since quitting: 5.8   Smokeless tobacco: Never   Tobacco comments:    1 pack lasts about 2.5 days  Vaping Use   Vaping Use: Never used  Substance and Sexual Activity   Alcohol use: No   Drug use: No   Sexual activity: Not Currently    Birth control/protection: Surgical  Other Topics Concern   Not on file  Social History Narrative   Lives at home alone in an apt   Right handed   Disabled since 1998   Caffeine: about 30 oz daily   Social Determinants of Health   Financial Resource Strain: Low Risk  (02/15/2020)   Overall Financial Resource Strain (CARDIA)    Difficulty of Paying Living Expenses: Not hard at all  Food Insecurity: No Food Insecurity (11/26/2021)   Hunger Vital Sign    Worried About Running Out of Food in the Last Year: Never true    Ran Out of Food in the  Last Year: Never true  Transportation Needs: No Transportation Needs (12/11/2021)   PRAPARE - Hydrologist (Medical): No    Lack of Transportation (Non-Medical): No  Physical Activity: Insufficiently Active (11/26/2021)   Exercise Vital Sign    Days of Exercise per Week: 2 days    Minutes of Exercise per Session: 30 min  Stress: Stress Concern Present (02/15/2020)  [place] Questionnaire    Feeling of Stress : Very much  Social Connections: Unknown (02/15/2020)   Social Connection and Isolation Panel [NHANES]    Frequency of Communication with Friends and Family: More than three times a week    Frequency of Social Gatherings with Friends and Family: More than three times a week    Attends Religious Services: Not on file    Active Member of Clubs or Organizations: Not on file    Attends Archivist Meetings: Not on file    Marital Status: Not on file  Intimate Partner Violence: Not At Risk (02/15/2020)   Humiliation, Afraid, Rape, and Kick questionnaire    Fear of Current or Ex-Partner: No    Emotionally Abused: No    Physically Abused: No    Sexually Abused: No    Outpatient Medications Prior to Visit  Medication Sig Dispense Refill   acetaminophen (TYLENOL) 500 MG tablet Take 500 mg by mouth every 6 (six) hours as needed.     amLODipine (NORVASC) 5 MG tablet Take 1 tablet (5 mg total) by mouth daily. 30 tablet 11   ARIPiprazole (ABILIFY) 5 MG tablet Take 1 tablet (5 mg total) by mouth at bedtime. 30 tablet 1   Carboxymethylcellulose Sodium (LUBRICANT EYE DROPS OP) Place 1 drop into both eyes daily as needed (Dry eye). Systane     [START ON 05/23/2022] citalopram (CELEXA) 40 MG tablet Take 1 tablet (40 mg total) by mouth daily. 90 tablet 0   diphenhydrAMINE (BENADRYL) 25 MG tablet Take 25 mg by mouth every 6 (six) hours as needed for itching.     diphenoxylate-atropine (LOMOTIL) 2.5-0.025 MG  tablet Take 1 tablet by mouth 2 (two) times daily as needed for diarrhea or loose stools. 8 tablet 0   fluticasone (FLONASE) 50 MCG/ACT nasal spray Place 2 sprays into both nostrils daily as needed for allergies or rhinitis. 16 g 12   gabapentin (NEURONTIN) 300 MG capsule Take 1 capsule (300 mg total) by mouth 4 (four) times daily. 360 capsule 3   ibuprofen (ADVIL) 800 MG tablet Take 800 mg by mouth every 8 (eight) hours as needed.     [START ON 05/24/2022] lamoTRIgine (LAMICTAL) 200 MG tablet Take 1 tablet (200 mg total) by mouth daily. 90 tablet 0   methocarbamol (ROBAXIN) 500 MG tablet Take 500 mg by mouth 3 (three) times daily.     oxyCODONE (OXY IR/ROXICODONE) 5 MG immediate release tablet Take by mouth.     oxyCODONE-acetaminophen (PERCOCET) 10-325 MG tablet Take 1 tablet by mouth every 4 (four) hours as needed for pain.     atorvastatin (LIPITOR) 40 MG tablet Take 40 mg by mouth daily.     omeprazole (PRILOSEC) 40 MG capsule Take 1 capsule (40 mg total) by mouth daily before breakfast. Take 30 minutes before breakfast. 90 capsule 1   latanoprost (XALATAN) 0.005 % ophthalmic solution SMARTSIG:In Eye(s)     aspirin EC 81 MG tablet Take 81 mg by mouth daily. Swallow whole. (Patient not taking: Reported on 05/21/2022)     clotrimazole-betamethasone (LOTRISONE) cream Apply 1 application. topically daily. (Patient not taking: Reported on 05/21/2022) 30 g 0   No facility-administered medications prior to visit.    Allergies  Allergen Reactions   Hydroxyzine Hives and Rash   Tramadol Hives   Dilaudid [Hydromorphone] Itching   Hydrocodone Hives and Rash    "terrible scratching"  Make take with benadryl   Ketorolac Rash  May take with benadryl   Percocet [Oxycodone-Acetaminophen] Itching and Rash    May take with benadryl     Toradol [Ketorolac Tromethamine] Rash    May take with benadryl        Objective:    Physical Exam Constitutional:      Appearance: Normal appearance.   Cardiovascular:     Rate and Rhythm: Normal rate.  Pulmonary:     Effort: Pulmonary effort is normal.  Abdominal:     General: Abdomen is flat.     Tenderness: There is abdominal tenderness (ruq abdominal and luq abdominal tenderness).  Skin:    General: Skin is warm.  Neurological:     General: No focal deficit present.     Mental Status: She is alert and oriented to person, place, and time. Mental status is at baseline.  Psychiatric:        Mood and Affect: Mood normal.        Behavior: Behavior normal.        Thought Content: Thought content normal.        Judgment: Judgment normal.       BP (!) 150/78   Pulse 63   Temp 98.6 F (37 C) (Oral)   Ht '5\' 1"'$  (1.549 m)   SpO2 98%   BMI 29.16 kg/m  Wt Readings from Last 3 Encounters:  05/10/22 154 lb 5.2 oz (70 kg)  03/05/22 156 lb (70.8 kg)  11/27/21 147 lb (66.7 kg)     Health Maintenance Due  Topic Date Due   Hepatitis C Screening  Never done   Medicare Annual Wellness (AWV)  01/02/2021    There are no preventive care reminders to display for this patient.  Lab Results  Component Value Date   TSH 1.65 05/21/2022   Lab Results  Component Value Date   WBC 5.7 05/21/2022   HGB 9.7 (L) 05/21/2022   HCT 30.5 (L) 05/21/2022   MCV 71.6 (L) 05/21/2022   PLT 483.0 (H) 05/21/2022   Lab Results  Component Value Date   NA 139 05/21/2022   K 4.1 05/21/2022   CO2 23 05/21/2022   GLUCOSE 84 05/21/2022   BUN 8 05/21/2022   CREATININE 0.73 05/21/2022   BILITOT 0.3 05/21/2022   ALKPHOS 64 05/21/2022   AST 14 05/21/2022   ALT 9 05/21/2022   PROT 6.4 05/21/2022   ALBUMIN 3.7 05/21/2022   CALCIUM 8.5 05/21/2022   ANIONGAP 10 05/10/2022   GFR 83.75 05/21/2022   Lab Results  Component Value Date   CHOL 221 (H) 03/17/2020   Lab Results  Component Value Date   HDL 59 03/17/2020   Lab Results  Component Value Date   LDLCALC 150 (H) 03/17/2020   Lab Results  Component Value Date   TRIG 69 03/17/2020    Lab Results  Component Value Date   CHOLHDL 4.5 12/23/2014   Lab Results  Component Value Date   HGBA1C 5.3 12/23/2014      Assessment & Plan:   Problem List Items Addressed This Visit       Digestive   GERD (gastroesophageal reflux disease)     Ongoing worsening advised patient to take Prilosec 20 mg twice daily Also advised patient that it is time to get back to GI Patient states she will call and make appointment for followup Try to decrease and or avoid spicy foods, fried fatty foods, and also caffeine and chocolate as these can increase heartburn symptoms.  Relevant Medications   omeprazole (PRILOSEC) 20 MG capsule   Diarrhea of presumed infectious origin    Ordering stool cultures to rule out infectious ideology Advised patient to continue and/or start a bland diet Advised patient Imodium okay as needed      Relevant Orders   C. difficile GDH and Toxin A/B   Gastrointestinal Pathogen Pnl RT, PCR   Giardia antigen     Other   Anxiety   Weakness - Primary    Ordering CBC and fecal occult blood to rule out anemia and/or blood in stool with GI last      Relevant Orders   CBC with Differential (Completed)   Fecal occult blood, imunochemical   Anemia   Relevant Orders   CBC with Differential (Completed)   Fecal occult blood, imunochemical   Other Visit Diagnoses     Other fatigue       Relevant Orders   Comprehensive metabolic panel (Completed)   TSH (Completed)   Burping       Relevant Orders   H. pylori breath test       Meds ordered this encounter  Medications   omeprazole (PRILOSEC) 20 MG capsule    Sig: Take 1 capsule (20 mg total) by mouth 2 (two) times daily before a meal.    Dispense:  180 capsule    Refill:  0    Order Specific Question:   Supervising Provider    Answer:   BEDSOLE, AMY E [[id number]]    Follow-up: Return in about 3 weeks (around 06/11/2022) for f/u abdominal pain .    Eugenia Pancoast, FNP

## 2022-05-22 DIAGNOSIS — R197 Diarrhea, unspecified: Secondary | ICD-10-CM | POA: Insufficient documentation

## 2022-05-22 DIAGNOSIS — D649 Anemia, unspecified: Secondary | ICD-10-CM | POA: Insufficient documentation

## 2022-05-22 LAB — COMPREHENSIVE METABOLIC PANEL
ALT: 9 U/L (ref 0–35)
AST: 14 U/L (ref 0–37)
Albumin: 3.7 g/dL (ref 3.5–5.2)
Alkaline Phosphatase: 64 U/L (ref 39–117)
BUN: 8 mg/dL (ref 6–23)
CO2: 23 mEq/L (ref 19–32)
Calcium: 8.5 mg/dL (ref 8.4–10.5)
Chloride: 107 mEq/L (ref 96–112)
Creatinine, Ser: 0.73 mg/dL (ref 0.40–1.20)
GFR: 83.75 mL/min (ref >60.00)
Glucose, Bld: 84 mg/dL (ref 70–99)
Potassium: 4.1 mEq/L (ref 3.5–5.1)
Sodium: 139 mEq/L (ref 135–145)
Total Bilirubin: 0.3 mg/dL (ref 0.2–1.2)
Total Protein: 6.4 g/dL (ref 6.0–8.3)

## 2022-05-22 LAB — CBC WITH DIFFERENTIAL/PLATELET
Basophils Absolute: 0 10*3/uL (ref 0.0–0.1)
Basophils Relative: 0.8 % (ref 0.0–3.0)
Eosinophils Absolute: 0.1 10*3/uL (ref 0.0–0.7)
Eosinophils Relative: 2.1 % (ref 0.0–5.0)
HCT: 30.5 % — ABNORMAL LOW (ref 36.0–46.0)
Hemoglobin: 9.7 g/dL — ABNORMAL LOW (ref 12.0–15.0)
Lymphocytes Relative: 26.5 % (ref 12.0–46.0)
Lymphs Abs: 1.5 10*3/uL (ref 0.7–4.0)
MCHC: 31.8 g/dL (ref 30.0–36.0)
MCV: 71.6 fl — ABNORMAL LOW (ref 78.0–100.0)
Monocytes Absolute: 0.3 10*3/uL (ref 0.1–1.0)
Monocytes Relative: 5.6 % (ref 3.0–12.0)
Neutro Abs: 3.7 10*3/uL (ref 1.4–7.7)
Neutrophils Relative %: 65 % (ref 43.0–77.0)
Platelets: 483 10*3/uL — ABNORMAL HIGH (ref 150.0–400.0)
RBC: 4.25 Mil/uL (ref 3.87–5.11)
RDW: 19.8 % — ABNORMAL HIGH (ref 11.5–15.5)
WBC: 5.7 10*3/uL (ref 4.0–10.5)

## 2022-05-22 LAB — H. PYLORI BREATH TEST: H. pylori Breath Test: NOT DETECTED

## 2022-05-22 LAB — TSH: TSH: 1.65 u[IU]/mL (ref 0.35–5.50)

## 2022-05-22 NOTE — Assessment & Plan Note (Signed)
  Ongoing worsening advised patient to take Prilosec 20 mg twice daily Also advised patient that it is time to get back to GI Patient states she will call and make appointment for followup Try to decrease and or avoid spicy foods, fried fatty foods, and also caffeine and chocolate as these can increase heartburn symptoms.

## 2022-05-22 NOTE — Assessment & Plan Note (Signed)
Ordering stool cultures to rule out infectious ideology Advised patient to continue and/or start a bland diet Advised patient Imodium okay as needed

## 2022-05-22 NOTE — Assessment & Plan Note (Signed)
Ordering CBC and fecal occult blood to rule out anemia and/or blood in stool with GI last

## 2022-05-24 ENCOUNTER — Telehealth: Payer: Self-pay | Admitting: Family

## 2022-05-24 ENCOUNTER — Telehealth: Payer: Self-pay

## 2022-05-24 DIAGNOSIS — R0609 Other forms of dyspnea: Secondary | ICD-10-CM

## 2022-05-24 NOTE — Telephone Encounter (Signed)
Home Health verbal orders Caller Name: [place] Name: [place] number: [phone]  Requesting extend OT and [facility] aide  Frequency: OT (1x a week for 4wks/every other week for 4 weeks) HH aide (1x a week for 8 weeks)  Please forward to [facility] pool or providers CMA

## 2022-05-24 NOTE — Telephone Encounter (Signed)
Pt niece called and stated that you talk to Mo about given her an inhaler and medication that helps with nausea. They wanted to know if you was waiting for test results before you prescribe her anything?

## 2022-05-24 NOTE — Telephone Encounter (Signed)
Inhaler for what? We didn't discuss anything about an inhaler and I do not see that he was on this previously?   Also in regards to something for nausea, we talked about diarrhea but nothing for nausea. For her the diarrhea seemed to be more pressing. Can we please get more information?

## 2022-05-24 NOTE — Telephone Encounter (Signed)
Verbal orders given

## 2022-05-24 NOTE — Telephone Encounter (Signed)
Approval of verbal orders given to [place].

## 2022-05-27 MED ORDER — ALBUTEROL SULFATE HFA 108 (90 BASE) MCG/ACT IN AERS: 2.0000 | INHALATION_SPRAY | RESPIRATORY_TRACT | 0 refills | Status: DC | PRN

## 2022-05-27 NOTE — Telephone Encounter (Signed)
Called and spoke to patient. She states that she needs refill on Ventolin inhaler. States that she was given in ED but has been out for two weeks. Thinks her symptoms are getting better but still has wheezing and would like refill. She states that she was taking as much as 4 times a day that is why she ran out.   As far as [place] medications states that she did request at office visit but has had decreased symptoms of nasua and loose stools over the last few days. Has only had [place] once in the last 5 days. Patient request she doesn't care if she gets anything for nasua at this point but wants to make sure we call in the inhaler.

## 2022-05-27 NOTE — Telephone Encounter (Signed)
Refilled inhaler

## 2022-05-27 NOTE — Telephone Encounter (Signed)
Called patient reviewed all information and repeated back to me. Will call if any questions.  ? ?

## 2022-05-27 NOTE — Addendum Note (Signed)
Addended by: Eugenia Pancoast on: 05/27/2022 01:01 PM   Modules accepted: Orders

## 2022-05-29 ENCOUNTER — Telehealth: Payer: Self-pay | Admitting: Family

## 2022-05-29 NOTE — Telephone Encounter (Signed)
Orders received from provider and faxed today. No further action needed at this time.

## 2022-05-29 NOTE — Telephone Encounter (Signed)
Rose from [employer] called and wanted to know the status of the home health orders and she didn't leave a number she said can they call the daughter back Mickel Baas [phone].

## 2022-06-04 ENCOUNTER — Other Ambulatory Visit: Payer: Self-pay | Admitting: Student

## 2022-06-04 DIAGNOSIS — M47812 Spondylosis without myelopathy or radiculopathy, cervical region: Secondary | ICD-10-CM

## 2022-06-07 ENCOUNTER — Telehealth: Payer: Self-pay | Admitting: Family

## 2022-06-07 NOTE — Telephone Encounter (Signed)
Patient called in stating that she has boils on the outside of her vagina again,and would like to know if an antibiotic can be called in for her?  [place], [place] - [place] Phone: [phone]  Fax: [phone]
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
## 2022-06-10 NOTE — Telephone Encounter (Signed)
Patient confirmed she can come in tomorrow at 12:00pm for appointment

## 2022-06-10 NOTE — Telephone Encounter (Signed)
Scheduled patient appt 06/11/22 @ 12:00pm. She will verify with her friend [name] that she can come at this time and call back to confirm.

## 2022-06-11 ENCOUNTER — Encounter: Payer: Self-pay | Admitting: Family

## 2022-06-11 ENCOUNTER — Ambulatory Visit (INDEPENDENT_AMBULATORY_CARE_PROVIDER_SITE_OTHER): Payer: 59 | Admitting: Family

## 2022-06-11 ENCOUNTER — Ambulatory Visit: Payer: Medicare Other | Admitting: Psychiatry

## 2022-06-11 VITALS — BP 134/84 | HR 82 | Temp 98.7°F

## 2022-06-11 DIAGNOSIS — A6004 Herpesviral vulvovaginitis: Secondary | ICD-10-CM

## 2022-06-11 DIAGNOSIS — N898 Other specified noninflammatory disorders of vagina: Secondary | ICD-10-CM | POA: Diagnosis not present

## 2022-06-11 DIAGNOSIS — L0291 Cutaneous abscess, unspecified: Secondary | ICD-10-CM | POA: Diagnosis not present

## 2022-06-11 MED ORDER — VALACYCLOVIR HCL 1 G PO TABS: ORAL_TABLET | ORAL | 0 refills | Status: DC

## 2022-06-11 MED ORDER — SULFAMETHOXAZOLE-TRIMETHOPRIM 800-160 MG PO TABS: 1.0000 | ORAL_TABLET | Freq: Two times a day (BID) | ORAL | 0 refills | Status: AC

## 2022-06-11 NOTE — Progress Notes (Signed)
Established Patient Office Visit  Subjective:   Patient ID: Sydney Evans, female    DOB: 10-Sep-1950  Age: 72 y.o. MRN: [id number]  CC: Vagina boils  HPI: Sydney Evans is a 72 y.o. female presenting today for vagina boils.   Reports boils became present last week. States that boils are increasing in size each day and are severely painful. States there about 4-5 of them. Reports moderate amount of bleeding from boils but no other drainage. Reports that she has to wear panty liner due to bleeding. States that she has had this issue before. Denies fever, chills. Denis urinary frequency, dysuria, abdominal pain.      ROS: Negative unless specifically indicated above in HPI.   Relevant past medical history reviewed and updated as indicated.   Allergies and medications reviewed and updated.   Current Outpatient Medications:    acetaminophen (TYLENOL) 500 MG tablet, Take 500 mg by mouth every 6 (six) hours as needed., Disp: , Rfl:    albuterol (VENTOLIN HFA) 108 (90 Base) MCG/ACT inhaler, Inhale 2 puffs into the lungs every 4 (four) hours as needed for shortness of breath., Disp: 1 each, Rfl: 0   amLODipine (NORVASC) 5 MG tablet, Take 1 tablet (5 mg total) by mouth daily., Disp: 30 tablet, Rfl: 11   ARIPiprazole (ABILIFY) 5 MG tablet, Take 1 tablet (5 mg total) by mouth at bedtime., Disp: 30 tablet, Rfl: 1   Carboxymethylcellulose Sodium (LUBRICANT EYE DROPS OP), Place 1 drop into both eyes daily as needed (Dry eye). Systane, Disp: , Rfl:    citalopram (CELEXA) 40 MG tablet, Take 1 tablet (40 mg total) by mouth daily., Disp: 90 tablet, Rfl: 0   diphenhydrAMINE (BENADRYL) 25 MG tablet, Take 25 mg by mouth every 6 (six) hours as needed for itching., Disp: , Rfl:    diphenoxylate-atropine (LOMOTIL) 2.5-0.025 MG tablet, Take 1 tablet by mouth 2 (two) times daily as needed for diarrhea or loose stools., Disp: 8 tablet, Rfl: 0   fluticasone (FLONASE) 50 MCG/ACT nasal spray, Place 2 sprays  into both nostrils daily as needed for allergies or rhinitis., Disp: 16 g, Rfl: 12   gabapentin (NEURONTIN) 300 MG capsule, Take 1 capsule (300 mg total) by mouth 4 (four) times daily., Disp: 360 capsule, Rfl: 3   ibuprofen (ADVIL) 800 MG tablet, Take 800 mg by mouth every 8 (eight) hours as needed., Disp: , Rfl:    lamoTRIgine (LAMICTAL) 200 MG tablet, Take 1 tablet (200 mg total) by mouth daily., Disp: 90 tablet, Rfl: 0   latanoprost (XALATAN) 0.005 % ophthalmic solution, SMARTSIG:In Eye(s), Disp: , Rfl:    methocarbamol (ROBAXIN) 500 MG tablet, Take 500 mg by mouth 3 (three) times daily., Disp: , Rfl:    omeprazole (PRILOSEC) 20 MG capsule, Take 1 capsule (20 mg total) by mouth 2 (two) times daily before a meal. (Patient taking differently: Take 40 mg by mouth 2 (two) times daily before a meal.), Disp: 180 capsule, Rfl: 0   oxyCODONE-acetaminophen (PERCOCET) 10-325 MG tablet, Take 1 tablet by mouth every 4 (four) hours as needed for pain., Disp: , Rfl:    sulfamethoxazole-trimethoprim (BACTRIM DS) 800-160 MG tablet, Take 1 tablet by mouth 2 (two) times daily for 7 days., Disp: 14 tablet, Rfl: 0   valACYclovir (VALTREX) 1000 MG tablet, Take two tablets bid for five days prn outbreak, Disp: 30 tablet, Rfl: 0  Allergies  Allergen Reactions   Hydroxyzine Hives and Rash   Tramadol Hives   Dilaudid [Hydromorphone] Itching  Hydrocodone Hives and Rash    "terrible scratching"  Make take with benadryl   Ketorolac Rash     May take with benadryl   Percocet [Oxycodone-Acetaminophen] Itching and Rash    May take with benadryl     Toradol [Ketorolac Tromethamine] Rash    May take with benadryl    Objective:   BP 134/84   Pulse 82   Temp 98.7 F (37.1 C) (Temporal)   SpO2 98%    Physical Exam Vitals reviewed.  Constitutional:      Appearance: Normal appearance.  Cardiovascular:     Rate and Rhythm: Normal rate and regular rhythm.  Pulmonary:     Effort: Pulmonary effort is normal.      Breath sounds: Normal breath sounds.  Genitourinary:    Comments: Abscess and ulceration present on mons pubis. Scant amount of serosanguineous drainage from abscess.  Neurological:     Mental Status: She is alert and oriented to person, place, and time.  Psychiatric:        Mood and Affect: Mood normal.        Behavior: Behavior normal.        Thought Content: Thought content normal.        Judgment: Judgment normal.     Assessment & Plan:  Herpes simplex vulvovaginitis Assessment & Plan: Rx sent to patient's pharmacy Valacyclovir '1000mg'$ .    Orders: -     valACYclovir HCl; Take two tablets bid for five days prn outbreak  Dispense: 30 tablet; Refill: 0 -     Viral culture  Cutaneous abscess, unspecified site Assessment & Plan: Rx sent to patient's pharmacy for Bactrim 800-'160mg'$ . Advised patient to use warm compresses on area.  Patient to notify provider if there is an increase in size and pain in area, or if fever and/or chills develop  Orders: -     Sulfamethoxazole-Trimethoprim; Take 1 tablet by mouth 2 (two) times daily for 7 days.  Dispense: 14 tablet; Refill: 0  Vaginal lesion Assessment & Plan: Viral culture collected and pending. Rx for Valacyclovir sent to patient's pharmacy.   Orders: -     Viral culture     Follow up plan: Return if symptoms do no improve or worsen within the next 3 days.Johny Shock, RN AGNP-student

## 2022-06-11 NOTE — Patient Instructions (Signed)
Due to recent changes in healthcare laws, you may see results of your imaging and/or laboratory studies on MyChart before I have had a chance to review them.  I understand that in some cases there may be results that are confusing or concerning to you. Please understand that not all results are received at the same time and often I may need to interpret multiple results in order to provide you with the best plan of care or course of treatment. Therefore, I ask that you please give me 2 business days to thoroughly review all your results before contacting my office for clarification. Should we see a critical lab result, you will be contacted sooner.   It was a pleasure seeing you today! Please do not hesitate to reach out with any questions and or concerns.  Regards,   Blondine Hottel FNP-C  

## 2022-06-11 NOTE — Assessment & Plan Note (Signed)
Rx sent to patient's pharmacy for Bactrim 800-'160mg'$ . Advised patient to use warm compresses on area.  Patient to notify provider if there is an increase in size and pain in area, or if fever and/or chills develop

## 2022-06-11 NOTE — Assessment & Plan Note (Addendum)
Suspected herpes, pending viral culture. Rx for Valacyclovir sent to patient's pharmacy.   I evaluated the patient,  was consulted regarding plans for treatment of care, and agree with the assessment and plan per Joya Gaskins, RN, DNP student.  -Eugenia Pancoast, FNP-C

## 2022-06-11 NOTE — Assessment & Plan Note (Signed)
Rx sent to patient's pharmacy Valacyclovir '1000mg'$ .

## 2022-06-11 NOTE — Progress Notes (Signed)
Established Patient Office Visit  Subjective:      CC:  Chief Complaint  Patient presents with   vaginal lesions     HPI: Sydney Evans is a 72 y.o. female presenting on 06/11/2022 for vaginal lesions  .   New complaints:  Vaginal boils that started last week around the lips of the vagina. They are pretty painful, thinks she has about four or five of them that are increasing in size. She is unable to visualize.      Social history:  Relevant past medical, surgical, family and social history reviewed and updated as indicated. Interim medical history since our last visit reviewed.  Allergies and medications reviewed and updated.  DATA REVIEWED: CHART IN EPIC     ROS: Negative unless specifically indicated above in HPI.    Current Outpatient Medications:    acetaminophen (TYLENOL) 500 MG tablet, Take 500 mg by mouth every 6 (six) hours as needed., Disp: , Rfl:    albuterol (VENTOLIN HFA) 108 (90 Base) MCG/ACT inhaler, Inhale 2 puffs into the lungs every 4 (four) hours as needed for shortness of breath., Disp: 1 each, Rfl: 0   amLODipine (NORVASC) 5 MG tablet, Take 1 tablet (5 mg total) by mouth daily., Disp: 30 tablet, Rfl: 11   ARIPiprazole (ABILIFY) 5 MG tablet, Take 1 tablet (5 mg total) by mouth at bedtime., Disp: 30 tablet, Rfl: 1   Carboxymethylcellulose Sodium (LUBRICANT EYE DROPS OP), Place 1 drop into both eyes daily as needed (Dry eye). Systane, Disp: , Rfl:    citalopram (CELEXA) 40 MG tablet, Take 1 tablet (40 mg total) by mouth daily., Disp: 90 tablet, Rfl: 0   diphenhydrAMINE (BENADRYL) 25 MG tablet, Take 25 mg by mouth every 6 (six) hours as needed for itching., Disp: , Rfl:    diphenoxylate-atropine (LOMOTIL) 2.5-0.025 MG tablet, Take 1 tablet by mouth 2 (two) times daily as needed for diarrhea or loose stools., Disp: 8 tablet, Rfl: 0   fluticasone (FLONASE) 50 MCG/ACT nasal spray, Place 2 sprays into both nostrils daily as needed for allergies or  rhinitis., Disp: 16 g, Rfl: 12   gabapentin (NEURONTIN) 300 MG capsule, Take 1 capsule (300 mg total) by mouth 4 (four) times daily., Disp: 360 capsule, Rfl: 3   ibuprofen (ADVIL) 800 MG tablet, Take 800 mg by mouth every 8 (eight) hours as needed., Disp: , Rfl:    lamoTRIgine (LAMICTAL) 200 MG tablet, Take 1 tablet (200 mg total) by mouth daily., Disp: 90 tablet, Rfl: 0   latanoprost (XALATAN) 0.005 % ophthalmic solution, SMARTSIG:In Eye(s), Disp: , Rfl:    methocarbamol (ROBAXIN) 500 MG tablet, Take 500 mg by mouth 3 (three) times daily., Disp: , Rfl:    omeprazole (PRILOSEC) 20 MG capsule, Take 1 capsule (20 mg total) by mouth 2 (two) times daily before a meal. (Patient taking differently: Take 40 mg by mouth 2 (two) times daily before a meal.), Disp: 180 capsule, Rfl: 0   oxyCODONE-acetaminophen (PERCOCET) 10-325 MG tablet, Take 1 tablet by mouth every 4 (four) hours as needed for pain., Disp: , Rfl:    sulfamethoxazole-trimethoprim (BACTRIM DS) 800-160 MG tablet, Take 1 tablet by mouth 2 (two) times daily for 7 days., Disp: 14 tablet, Rfl: 0   valACYclovir (VALTREX) 1000 MG tablet, Take two tablets bid for five days prn outbreak, Disp: 30 tablet, Rfl: 0      Objective:    BP 134/84   Pulse 82   Temp 98.7 F (37.1 C) (  Temporal)   SpO2 98%   Wt Readings from Last 3 Encounters:  05/10/22 154 lb 5.2 oz (70 kg)  03/05/22 156 lb (70.8 kg)  11/27/21 147 lb (66.7 kg)    Physical Exam Vitals reviewed.  Genitourinary:    Labia:        Right: Lesion (ulcerate lesion left upper groin/pubic area, and right lesion with redness raised tenderness with small red induration) present.            Assessment & Plan:  Herpes simplex vulvovaginitis Assessment & Plan: Rx sent to patient's pharmacy Valacyclovir '1000mg'$ .    Orders: -     valACYclovir HCl; Take two tablets bid for five days prn outbreak  Dispense: 30 tablet; Refill: 0 -     Viral culture  Cutaneous abscess, unspecified  site Assessment & Plan: Rx sent to patient's pharmacy for Bactrim 800-'160mg'$ . Advised patient to use warm compresses on area.  Patient to notify provider if there is an increase in size and pain in area, or if fever and/or chills develop  Orders: -     Sulfamethoxazole-Trimethoprim; Take 1 tablet by mouth 2 (two) times daily for 7 days.  Dispense: 14 tablet; Refill: 0  Vaginal lesion Assessment & Plan: Suspected herpes, pending viral culture. Rx for Valacyclovir sent to patient's pharmacy.   I evaluated the patient,  was consulted regarding plans for treatment of care, and agree with the assessment and plan per Joya Gaskins, RN, DNP student.  Eugenia Pancoast, FNP-C   Orders: -     Viral culture     Return if symptoms do no improve or worsen within the next 3 days.Eugenia Pancoast, MSN, APRN, FNP-C [place]

## 2022-06-11 NOTE — Progress Notes (Deleted)
Established Patient Office Visit  Subjective:   Patient ID: Sydney Evans, female    DOB: [birth year]  Age: 72 y.o. MRN: [id number]  CC: No chief complaint on file.   HPI: Sydney Evans is a 72 y.o. female presenting today for    HPI     ROS: Negative unless specifically indicated above in HPI.   Relevant past medical history reviewed and updated as indicated.   Allergies and medications reviewed and updated.   Current Outpatient Medications:    acetaminophen (TYLENOL) 500 MG tablet, Take 500 mg by mouth every 6 (six) hours as needed., Disp: , Rfl:    albuterol (VENTOLIN HFA) 108 (90 Base) MCG/ACT inhaler, Inhale 2 puffs into the lungs every 4 (four) hours as needed for shortness of breath., Disp: 1 each, Rfl: 0   amLODipine (NORVASC) 5 MG tablet, Take 1 tablet (5 mg total) by mouth daily., Disp: 30 tablet, Rfl: 11   ARIPiprazole (ABILIFY) 5 MG tablet, Take 1 tablet (5 mg total) by mouth at bedtime., Disp: 30 tablet, Rfl: 1   Carboxymethylcellulose Sodium (LUBRICANT EYE DROPS OP), Place 1 drop into both eyes daily as needed (Dry eye). Systane, Disp: , Rfl:    citalopram (CELEXA) 40 MG tablet, Take 1 tablet (40 mg total) by mouth daily., Disp: 90 tablet, Rfl: 0   diphenhydrAMINE (BENADRYL) 25 MG tablet, Take 25 mg by mouth every 6 (six) hours as needed for itching., Disp: , Rfl:    diphenoxylate-atropine (LOMOTIL) 2.5-0.025 MG tablet, Take 1 tablet by mouth 2 (two) times daily as needed for diarrhea or loose stools., Disp: 8 tablet, Rfl: 0   fluticasone (FLONASE) 50 MCG/ACT nasal spray, Place 2 sprays into both nostrils daily as needed for allergies or rhinitis., Disp: 16 g, Rfl: 12   gabapentin (NEURONTIN) 300 MG capsule, Take 1 capsule (300 mg total) by mouth 4 (four) times daily., Disp: 360 capsule, Rfl: 3   ibuprofen (ADVIL) 800 MG tablet, Take 800 mg by mouth every 8 (eight) hours as needed., Disp: , Rfl:    lamoTRIgine (LAMICTAL) 200 MG tablet, Take 1 tablet (200 mg  total) by mouth daily., Disp: 90 tablet, Rfl: 0   latanoprost (XALATAN) 0.005 % ophthalmic solution, SMARTSIG:In Eye(s), Disp: , Rfl:    methocarbamol (ROBAXIN) 500 MG tablet, Take 500 mg by mouth 3 (three) times daily., Disp: , Rfl:    omeprazole (PRILOSEC) 20 MG capsule, Take 1 capsule (20 mg total) by mouth 2 (two) times daily before a meal. (Patient taking differently: Take 40 mg by mouth 2 (two) times daily before a meal.), Disp: 180 capsule, Rfl: 0   oxyCODONE-acetaminophen (PERCOCET) 10-325 MG tablet, Take 1 tablet by mouth every 4 (four) hours as needed for pain., Disp: , Rfl:   Allergies  Allergen Reactions   Hydroxyzine Hives and Rash   Tramadol Hives   Dilaudid [Hydromorphone] Itching   Hydrocodone Hives and Rash    "terrible scratching"  Make take with benadryl   Ketorolac Rash     May take with benadryl   Percocet [Oxycodone-Acetaminophen] Itching and Rash    May take with benadryl     Toradol [Ketorolac Tromethamine] Rash    May take with benadryl    Objective:   BP 134/84   Pulse 82   Temp 98.7 F (37.1 C) (Temporal)   SpO2 98%    Physical Exam  Assessment & Plan:  There are no diagnoses linked to this encounter.   Follow up plan: No follow-ups on  file.  Johny Shock, RN

## 2022-06-17 ENCOUNTER — Other Ambulatory Visit: Payer: Self-pay | Admitting: Psychiatry

## 2022-06-17 LAB — TIQ-MISC

## 2022-06-17 NOTE — Progress Notes (Signed)
Viral culture (tzanck smear) , assessed vaginal lesion. R/o herpes
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
## 2022-06-19 ENCOUNTER — Ambulatory Visit
Admission: RE | Admit: 2022-06-19 | Discharge: 2022-06-19 | Disposition: A | Discharge status: Home / Self Care | Payer: 59 | Source: Ambulatory Visit | Attending: Student | Admitting: Student

## 2022-06-19 DIAGNOSIS — M47812 Spondylosis without myelopathy or radiculopathy, cervical region: Secondary | ICD-10-CM

## 2022-06-19 MED ORDER — TRIAMCINOLONE ACETONIDE 40 MG/ML IJ SUSP (RADIOLOGY)
60.0000 mg | Freq: Once | INTRAMUSCULAR | Status: AC
  Administered 2022-06-19: 60 mg via EPIDURAL

## 2022-06-19 MED ORDER — IOPAMIDOL (ISOVUE-M 300) INJECTION 61%
1.0000 mL | Freq: Once | INTRAMUSCULAR | Status: AC
  Administered 2022-06-19: 1 mL via EPIDURAL

## 2022-06-19 NOTE — Progress Notes (Signed)
Can I get clarification on how to get this properly ordered in the future to get a herpetic lesion determined? It makes the patient uncomfortable to get this examined in the first place, and then when cancelled I hate to have to repeat it due to data collection error. I did ask for suggestions, and I have ordered it this way before with no problem so not sure what to do moving forward? Any suggestions?

## 2022-06-19 NOTE — Discharge Instructions (Signed)

## 2022-06-20 ENCOUNTER — Ambulatory Visit (INDEPENDENT_AMBULATORY_CARE_PROVIDER_SITE_OTHER): Payer: 59 | Admitting: Psychiatry

## 2022-06-20 ENCOUNTER — Encounter: Payer: Self-pay | Admitting: Psychiatry

## 2022-06-20 ENCOUNTER — Other Ambulatory Visit: Payer: Self-pay | Admitting: Family

## 2022-06-20 VITALS — BP 147/78 | HR 53 | Temp 98.1°F | Ht 61.0 in

## 2022-06-20 DIAGNOSIS — F319 Bipolar disorder, unspecified: Secondary | ICD-10-CM

## 2022-06-20 DIAGNOSIS — F4310 Post-traumatic stress disorder, unspecified: Secondary | ICD-10-CM | POA: Diagnosis not present

## 2022-06-20 DIAGNOSIS — R0609 Other forms of dyspnea: Secondary | ICD-10-CM

## 2022-06-20 NOTE — Patient Instructions (Signed)
Reduce citalopram 20 mg daily for one week, then discontinue Start sertraline 25 mg at night for one week, then 50 mg at night Continue lamotrigine 200 mg daily   Continue Abilify 5 mg at night  Discontinue propranolol  Next appointment: 4/15 at 9:30

## 2022-06-21 ENCOUNTER — Telehealth: Payer: Self-pay | Admitting: Family

## 2022-06-21 NOTE — Telephone Encounter (Signed)
Patient called in and stated that she may need some more Sulfamethoxazole-Trimethoprim 800-160 MG. She that it has helped but she still has some that is there. She was wanting to know if a refill could be sent in. Please advise. Thank you!

## 2022-06-23 LAB — HERPES SIMPLEX VIRUS CULTURE W/RFLX TO TYPING

## 2022-06-23 LAB — VARICELLA ZOSTER VIRUS,RAPID METHOD,CULTURE

## 2022-06-23 LAB — VIRAL CULTURE VIRC

## 2022-06-24 NOTE — Telephone Encounter (Signed)
See new results note.

## 2022-06-24 NOTE — Progress Notes (Signed)
Vaginal culture negative for herpes.  Has pt improved symptoms with medication?

## 2022-07-01 ENCOUNTER — Telehealth: Payer: Self-pay | Admitting: Family

## 2022-07-01 DIAGNOSIS — A6004 Herpesviral vulvovaginitis: Secondary | ICD-10-CM

## 2022-07-01 MED ORDER — VALACYCLOVIR HCL 500 MG PO TABS: 500.0000 mg | ORAL_TABLET | Freq: Every day | ORAL | 1 refills | Status: DC

## 2022-07-01 NOTE — Telephone Encounter (Signed)
Documents have been placed in the inbox tray in Tabitha's office.

## 2022-07-01 NOTE — Telephone Encounter (Signed)
Sent in refills. However have pt take daily for suppression. I will send in 500 mg that she will take one tablet once daily. Drink with plenty of water.

## 2022-07-01 NOTE — Telephone Encounter (Signed)
Please see patients request

## 2022-07-01 NOTE — Telephone Encounter (Signed)
Patient would like to know if a rx for acyclovir or an antibiotic can be called in for her again.She stated that the vaginal oils have came back again.  [place], [place] - [place] Phone: [phone]  Fax: [phone]

## 2022-07-01 NOTE — Telephone Encounter (Signed)
Patient dropped off document  home health/requesting more hours , to be filled out by provider. Patient requested to send it via Call Patient to pick up within 5-days. Document is located in providers tray at front office.

## 2022-07-03 NOTE — Telephone Encounter (Signed)
Spoke with the patient and advised as instructed. Pt understood and agrees with plan.

## 2022-07-03 NOTE — Telephone Encounter (Signed)
Reading over the forms could you ask the patient the following:   Is she currently receiving any physical therapy, speech therapy, and or occupational therapy?

## 2022-07-03 NOTE — Telephone Encounter (Signed)
Spoke with the patient and was informed that she have PT and OT once a week.

## 2022-07-04 NOTE — Telephone Encounter (Signed)
Patient called back and said she will pick it up in the morning

## 2022-07-04 NOTE — Telephone Encounter (Signed)
Patient called to follow up because she said its due tomorrow 07/05/22. Please advise. Call back when ready at [phone]

## 2022-07-04 NOTE — Telephone Encounter (Signed)
Completed and put in outbox. Please let pt know and if we need to fax or whatever, please do. Thanks!

## 2022-07-05 NOTE — Telephone Encounter (Signed)
Forms placed up front for pick up.

## 2022-07-16 ENCOUNTER — Telehealth: Payer: Self-pay | Admitting: Family

## 2022-07-16 ENCOUNTER — Other Ambulatory Visit: Payer: Self-pay

## 2022-07-16 ENCOUNTER — Emergency Department: Payer: 59

## 2022-07-16 ENCOUNTER — Emergency Department
Admission: EM | Admit: 2022-07-16 | Discharge: 2022-07-16 | Disposition: A | Discharge status: Home / Self Care | Payer: 59 | Attending: Emergency Medicine | Admitting: Emergency Medicine

## 2022-07-16 DIAGNOSIS — R1030 Lower abdominal pain, unspecified: Secondary | ICD-10-CM | POA: Insufficient documentation

## 2022-07-16 DIAGNOSIS — E860 Dehydration: Secondary | ICD-10-CM | POA: Insufficient documentation

## 2022-07-16 DIAGNOSIS — R197 Diarrhea, unspecified: Secondary | ICD-10-CM | POA: Diagnosis present

## 2022-07-16 LAB — CBC
HCT: 34.3 % — ABNORMAL LOW (ref 36.0–46.0)
Hemoglobin: 9.8 g/dL — ABNORMAL LOW (ref 12.0–15.0)
MCH: 20.9 pg — ABNORMAL LOW (ref 26.0–34.0)
MCHC: 28.6 g/dL — ABNORMAL LOW (ref 30.0–36.0)
MCV: 73.3 fL — ABNORMAL LOW (ref 80.0–100.0)
Platelets: 419 10*3/uL — ABNORMAL HIGH (ref 150–400)
RBC: 4.68 MIL/uL (ref 3.87–5.11)
RDW: 18.6 % — ABNORMAL HIGH (ref 11.5–15.5)
WBC: 4.7 10*3/uL (ref 4.0–10.5)
nRBC: 0 % (ref 0.0–0.2)

## 2022-07-16 LAB — URINALYSIS, ROUTINE W REFLEX MICROSCOPIC
Bilirubin Urine: NEGATIVE
Glucose, UA: NEGATIVE mg/dL
Hgb urine dipstick: NEGATIVE
Ketones, ur: NEGATIVE mg/dL
Leukocytes,Ua: NEGATIVE
Nitrite: NEGATIVE
Protein, ur: NEGATIVE mg/dL
Specific Gravity, Urine: 1.023 (ref 1.005–1.030)
pH: 7 (ref 5.0–8.0)

## 2022-07-16 LAB — COMPREHENSIVE METABOLIC PANEL
ALT: 13 U/L (ref 0–44)
AST: 20 U/L (ref 15–41)
Albumin: 3.5 g/dL (ref 3.5–5.0)
Alkaline Phosphatase: 67 U/L (ref 38–126)
Anion gap: 9 (ref 5–15)
BUN: 10 mg/dL (ref 8–23)
CO2: 23 mmol/L (ref 22–32)
Calcium: 8.5 mg/dL — ABNORMAL LOW (ref 8.9–10.3)
Chloride: 102 mmol/L (ref 98–111)
Creatinine, Ser: 0.66 mg/dL (ref 0.44–1.00)
GFR, Estimated: >60 mL/min (ref >60)
Glucose, Bld: 88 mg/dL (ref 70–99)
Potassium: 3.8 mmol/L (ref 3.5–5.1)
Sodium: 134 mmol/L — ABNORMAL LOW (ref 135–145)
Total Bilirubin: 0.5 mg/dL (ref 0.3–1.2)
Total Protein: 7.2 g/dL (ref 6.5–8.1)

## 2022-07-16 LAB — GASTROINTESTINAL PANEL BY PCR, STOOL (REPLACES STOOL CULTURE)

## 2022-07-16 LAB — C DIFFICILE QUICK SCREEN W PCR REFLEX
C Diff antigen: NEGATIVE
C Diff interpretation: NOT DETECTED
C Diff toxin: NEGATIVE

## 2022-07-16 LAB — LIPASE, BLOOD: Lipase: 32 U/L (ref 11–51)

## 2022-07-16 MED ORDER — DICYCLOMINE HCL 10 MG PO CAPS: 10.0000 mg | ORAL_CAPSULE | Freq: Three times a day (TID) | ORAL | 0 refills | Status: DC

## 2022-07-16 MED ORDER — ONDANSETRON 4 MG PO TBDP: 4.0000 mg | ORAL_TABLET | Freq: Three times a day (TID) | ORAL | 0 refills | Status: DC | PRN

## 2022-07-16 MED ORDER — IOHEXOL 300 MG/ML  SOLN
100.0000 mL | Freq: Once | INTRAMUSCULAR | Status: AC | PRN
  Administered 2022-07-16: 100 mL via INTRAVENOUS

## 2022-07-16 MED ORDER — ONDANSETRON HCL 4 MG/2ML IJ SOLN
4.0000 mg | Freq: Once | INTRAMUSCULAR | Status: AC
  Administered 2022-07-16: 4 mg via INTRAVENOUS
  Filled 2022-07-16: qty 2

## 2022-07-16 MED ORDER — DIPHENOXYLATE-ATROPINE 2.5-0.025 MG PO TABS: 1.0000 | ORAL_TABLET | Freq: Four times a day (QID) | ORAL | 0 refills | Status: AC | PRN

## 2022-07-16 MED ORDER — LACTATED RINGERS IV BOLUS
1000.0000 mL | Freq: Once | INTRAVENOUS | Status: AC
  Administered 2022-07-16: 1000 mL via INTRAVENOUS

## 2022-07-16 NOTE — ED Notes (Signed)
First Nurse Note: Pt to ED via [facility] from home for abdominal pain and diarrhea x 1.5 weeks. Vital signs stable per EMS. Pt's HR between 49-50. Per pt she states that this is normal for her.

## 2022-07-16 NOTE — ED Provider Notes (Signed)
[facility] Provider Note    Event Date/Time   First MD Initiated Contact with Patient 07/16/22 1520     (approximate)   History   Abdominal Pain and Diarrhea   HPI  Sydney Evans is a 72 y.o. female  here with diarrhea. Pt reports that for the past 8 days she has had profuse, water diarreha. She has had difficulty keeping anything in her system. Reports she took lomotil 8 tabs over the past day for this without improvement though it is now slowing down. Diarrhea has been water, non bloody. She did take abx for boils in her inguinal area at the onset of sx. No h/o similar issues with abx use in the past. Denies known h/o diverticulosis. No recent travel.       Physical Exam   Triage Vital Signs: ED Triage Vitals  Enc Vitals Group     BP 07/16/22 1433 (!) 158/64     Pulse Rate 07/16/22 1433 (!) 51     Resp 07/16/22 1433 18     Temp 07/16/22 1433 98 F (36.7 C)     Temp Source 07/16/22 1433 Oral     SpO2 07/16/22 1433 95 %     Weight 07/16/22 1433 154 lb 5.2 oz (70 kg)     Height 07/16/22 1433 '5\' 1"'$  (1.549 m)     Head Circumference --      Peak Flow --      Pain Score 07/16/22 1432 0     Pain Loc --      Pain Edu? --      Excl. in [place]? --     Most recent vital signs: Vitals:   07/16/22 1433 07/16/22 1839  BP: (!) 158/64 (!) 150/68  Pulse: (!) 51 (!) 58  Resp: 18 18  Temp: 98 F (36.7 C) 98 F (36.7 C)  SpO2: 95% 96%     General: Awake, no distress.  CV:  Good peripheral perfusion.  Resp:  Normal work of breathing. RRR. Abd:  No distention. Mild lower quadrant TTP, with no rebound or guarding. Other:  No LE edema. Well appearing. Dry MM.   ED Results / Procedures / Treatments   Labs (all labs ordered are listed, but only abnormal results are displayed) Labs Reviewed  COMPREHENSIVE METABOLIC PANEL - Abnormal; Notable for the following components:      Result Value   Sodium 134 (*)    Calcium 8.5 (*)    All other components  within normal limits  CBC - Abnormal; Notable for the following components:   Hemoglobin 9.8 (*)    HCT 34.3 (*)    MCV 73.3 (*)    MCH 20.9 (*)    MCHC 28.6 (*)    RDW 18.6 (*)    Platelets 419 (*)    All other components within normal limits  URINALYSIS, ROUTINE W REFLEX MICROSCOPIC - Abnormal; Notable for the following components:   Color, Urine STRAW (*)    APPearance CLEAR (*)    All other components within normal limits  C DIFFICILE QUICK SCREEN W PCR REFLEX    GASTROINTESTINAL PANEL BY PCR, STOOL (REPLACES STOOL CULTURE)  LIPASE, BLOOD     EKG Sinus bradycardia, VR 54. PR 136, QTS 74, QTc 421. No acute st elevations or depressions.   RADIOLOGY CT A/P No acute findings   I also independently reviewed and agree with radiologist interpretations.   PROCEDURES:  Critical Care performed: No   MEDICATIONS ORDERED  IN ED: Medications  lactated ringers bolus 1,000 mL (0 mLs Intravenous Stopped 07/16/22 1759)  ondansetron (ZOFRAN) injection 4 mg (4 mg Intravenous Given 07/16/22 1605)  iohexol (OMNIPAQUE) 300 MG/ML solution 100 mL (100 mLs Intravenous Contrast Given 07/16/22 1612)  ondansetron (ZOFRAN) injection 4 mg (4 mg Intravenous Given 07/16/22 1757)  lactated ringers bolus 1,000 mL (1,000 mLs Intravenous New Bag/Given 07/16/22 1759)     IMPRESSION / MDM / ASSESSMENT AND PLAN / ED COURSE  I reviewed the triage vital signs and the nursing notes.                              Differential diagnosis includes, but is not limited to, abx-associated diarrhea, diverticulitis, colitis, C. Diff, viral GI illness, IBS, IBD  Patient's presentation is most consistent with acute presentation with potential threat to life or bodily function.  72 year old here with generalized abdominal discomfort, diarrhea, after taking antibiotics.  Patient is overall well-appearing though mildly dehydrated.  CBC shows no significant leukocytosis.  Hemoglobin is at baseline.  CMP with normal renal  function and LFTs.  Lipase normal.  C. difficile negative.  Urinalysis negative.  CT abdomen and pelvis obtained, reviewed, and is also negative.  Suspect possible antibiotic associated diarrhea without C. difficile.  Will have her take probiotics, provide antidiarrheals, and a GI panel has been sent.  Return precautions given.  Encouraged hydration.  She feels better and is tolerating p.o. now without difficulty.     FINAL CLINICAL IMPRESSION(S) / ED DIAGNOSES   Final diagnoses:  Diarrhea, unspecified type  Dehydration     Rx / DC Orders   ED Discharge Orders          Ordered    diphenoxylate-atropine (LOMOTIL) 2.5-0.025 MG tablet  4 times daily PRN        07/16/22 1943    ondansetron (ZOFRAN-ODT) 4 MG disintegrating tablet  Every 8 hours PRN        07/16/22 1943    dicyclomine (BENTYL) 10 MG capsule  3 times daily before meals & bedtime        07/16/22 1943             Note:  This document was prepared using Dragon voice recognition software and may include unintentional dictation errors.   Duffy Bruce, MD 07/16/22 1944

## 2022-07-16 NOTE — Telephone Encounter (Signed)
Patient arrived at [facility] at 2:15pm today. Currently there.

## 2022-07-16 NOTE — ED Triage Notes (Signed)
Pt presents to the ED due to abdominal pain and diarrhea. Pt states she had diarrhea for 8 days. Pt denies vomit. Pt A&Ox4. Pt NAD

## 2022-07-16 NOTE — Telephone Encounter (Signed)
[place] practitioner Ramie  saw patient  today on video platform,patient stated that she  have been having diarrhea for 11 days or more. They called [facility] EMS and they took her to [facility].

## 2022-07-16 NOTE — Discharge Instructions (Signed)
Start taking an over-the-counter probiotic or try to eat one to two servings of Activia or other yogurt "with live cultures" to help with your diarrhea  Drink at least 6-8 glasses of water daily  Take the medications as prescribed

## 2022-07-16 NOTE — ED Notes (Signed)
Pt declined d/c VS.

## 2022-07-17 ENCOUNTER — Telehealth: Payer: Self-pay

## 2022-07-17 NOTE — Transitions of Care (Post Inpatient/ED Visit) (Signed)
   07/17/2022  Name: Sydney Evans MRN: [id number] DOB: January 22, 1951  Today's TOC FU Call Status: Today's TOC FU Call Status:: Unsuccessul Call (1st Attempt) Unsuccessful Call (1st Attempt) Date: 07/17/22  Attempted to reach the patient regarding the most recent Inpatient/ED visit.  Follow Up Plan: Additional outreach attempts will be made to reach the patient to complete the Transitions of Care (Post Inpatient/ED visit) call.   [place] LPN [place] Advisor Direct Dial [phone]

## 2022-07-17 NOTE — Telephone Encounter (Signed)
Noted  

## 2022-07-18 NOTE — Transitions of Care (Post Inpatient/ED Visit) (Signed)
   07/18/2022  Name: Sydney Evans MRN: [id number] DOB: Nov 12, 1950  Today's TOC FU Call Status: Today's TOC FU Call Status:: Successful TOC FU Call Competed Unsuccessful Call (1st Attempt) Date: 07/17/22 [facility] FU Call Complete Date: 07/18/22  Transition Care Management Follow-up Telephone Call Date of Discharge: 07/16/22 Discharge Facility: [facility]) Type of Discharge: Emergency Department Reason for ED Visit: Other: How have you been since you were released from the hospital?: Better Any questions or concerns?: No  Items Reviewed: Did you receive and understand the discharge instructions provided?: Yes Medications obtained and verified?: Yes (Medications Reviewed) Any new allergies since your discharge?: No Dietary orders reviewed?: Yes Do you have support at home?: Yes  Home Care and Equipment/Supplies: [place] Ordered?: No Any new equipment or medical supplies ordered?: No  Functional Questionnaire: Do you need assistance with bathing/showering or dressing?: No Do you need assistance with meal preparation?: No Do you need assistance with eating?: No Do you have difficulty maintaining continence: No Do you need assistance with getting out of bed/getting out of a chair/moving?: No Do you have difficulty managing or taking your medications?: No  Folllow up appointments reviewed: PCP Follow-up appointment confirmed?: No (refused) MD Provider Line Number:[phone] Given: Yes [facility] Follow-up appointment confirmed?: NA Do you need transportation to your follow-up appointment?: No Do you understand care options if your condition(s) worsen?: Yes-patient verbalized understanding    [place] LPN [place] [phone]

## 2022-08-03 ENCOUNTER — Emergency Department
Admission: EM | Admit: 2022-08-03 | Discharge: 2022-08-03 | Disposition: A | Discharge status: Home / Self Care | Payer: 59 | Attending: Emergency Medicine | Admitting: Emergency Medicine

## 2022-08-03 ENCOUNTER — Other Ambulatory Visit: Payer: Self-pay

## 2022-08-03 ENCOUNTER — Encounter: Payer: Self-pay | Admitting: Emergency Medicine

## 2022-08-03 DIAGNOSIS — E119 Type 2 diabetes mellitus without complications: Secondary | ICD-10-CM | POA: Diagnosis not present

## 2022-08-03 DIAGNOSIS — K120 Recurrent oral aphthae: Secondary | ICD-10-CM | POA: Insufficient documentation

## 2022-08-03 LAB — CBC
HCT: 35.3 % — ABNORMAL LOW (ref 36.0–46.0)
Hemoglobin: 10 g/dL — ABNORMAL LOW (ref 12.0–15.0)
MCH: 20.6 pg — ABNORMAL LOW (ref 26.0–34.0)
MCHC: 28.3 g/dL — ABNORMAL LOW (ref 30.0–36.0)
MCV: 72.6 fL — ABNORMAL LOW (ref 80.0–100.0)
Platelets: 412 10*3/uL — ABNORMAL HIGH (ref 150–400)
RBC: 4.86 MIL/uL (ref 3.87–5.11)
RDW: 19.1 % — ABNORMAL HIGH (ref 11.5–15.5)
WBC: 8.3 10*3/uL (ref 4.0–10.5)
nRBC: 0 % (ref 0.0–0.2)

## 2022-08-03 LAB — COMPREHENSIVE METABOLIC PANEL
ALT: 12 U/L (ref 0–44)
AST: 33 U/L (ref 15–41)
Albumin: 3.5 g/dL (ref 3.5–5.0)
Alkaline Phosphatase: 62 U/L (ref 38–126)
Anion gap: 12 (ref 5–15)
BUN: 8 mg/dL (ref 8–23)
CO2: 23 mmol/L (ref 22–32)
Calcium: 9.1 mg/dL (ref 8.9–10.3)
Chloride: 103 mmol/L (ref 98–111)
Creatinine, Ser: 0.82 mg/dL (ref 0.44–1.00)
GFR, Estimated: >60 mL/min (ref >60)
Glucose, Bld: 110 mg/dL — ABNORMAL HIGH (ref 70–99)
Potassium: 3.7 mmol/L (ref 3.5–5.1)
Sodium: 138 mmol/L (ref 135–145)
Total Bilirubin: 1 mg/dL (ref 0.3–1.2)
Total Protein: 7.3 g/dL (ref 6.5–8.1)

## 2022-08-03 MED ORDER — METOCLOPRAMIDE HCL 10 MG PO TABS: 10.0000 mg | ORAL_TABLET | Freq: Three times a day (TID) | ORAL | 1 refills | Status: DC

## 2022-08-03 MED ORDER — LIDOCAINE VISCOUS HCL 2 % MT SOLN: 5.0000 mL | Freq: Four times a day (QID) | OROMUCOSAL | 0 refills | Status: DC

## 2022-08-03 NOTE — ED Provider Notes (Signed)
[facility] Provider Note   Event Date/Time   First MD Initiated Contact with Patient 08/03/22 1357     (approximate) History  Nausea  HPI Sydney Evans is a 72 y.o. 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  Patient states that she can no longer eat toast and that she is concerned she is becoming malnourished.  Patient endorses burning/aching 5/10 pain over the right aspect of the inner mucosa of the mouth. ROS: Patient currently denies any vision changes, tinnitus, difficulty speaking, facial droop, sore throat, chest pain, shortness of breath, abdominal pain, nausea/vomiting/diarrhea, dysuria, or weakness/numbness/paresthesias in any extremity   Physical Exam  Triage Vital Signs: ED Triage Vitals [08/03/22 1337]  Enc Vitals Group     BP      Pulse Rate (!) 58     Resp 18     Temp 98.3 F (36.8 C)     Temp Source Oral     SpO2 97 %     Weight 167 lb 8.8 oz (76 kg)     Height 5\' 2"  (1.575 m)     Head Circumference      Peak Flow      Pain Score 5     Pain Loc      Pain Edu?      Excl. in [place]?    Most recent vital signs: Vitals:   08/03/22 1337  Pulse: (!) 58  Resp: 18  Temp: 98.3 F (36.8 C)  SpO2: 97%   General: Awake, oriented x4. CV:  Good peripheral perfusion.  Resp:  Normal effort.  Abd:  No distention.  Other:  Elderly the overweight Caucasian female laying in bed in no acute distress.  Erythematous lesions to the mucosa of the mouth along the right border and along the lower and upper lip crossing midline ED Results / Procedures / Treatments  Labs (all labs ordered are listed, but only abnormal results are displayed) Labs Reviewed  CBC - Abnormal; Notable for the following components:      Result Value   Hemoglobin 10.0 (*)    HCT 35.3 (*)    MCV 72.6 (*)    MCH 20.6  (*)    MCHC 28.3 (*)    RDW 19.1 (*)    Platelets 412 (*)    All other components within normal limits  COMPREHENSIVE METABOLIC PANEL - Abnormal; Notable for the following components:   Glucose, Bld 110 (*)    All other components within normal limits   PROCEDURES: Critical Care performed: No .1-3 Lead EKG Interpretation  Performed by: Naaman Plummer, MD Authorized by: Naaman Plummer, MD     Interpretation: normal     ECG rate:  71   ECG rate assessment: normal     Rhythm: sinus rhythm     Ectopy: none     Conduction: normal    MEDICATIONS ORDERED IN ED: Medications - No data to display IMPRESSION / MDM / [place] / ED COURSE  I reviewed the triage vital signs and the nursing notes.                             The patient is on the cardiac monitor to evaluate for evidence of arrhythmia and/or significant heart  rate changes. Patient's presentation is most consistent with acute presentation with potential threat to life or bodily function. Patient is a 72 year old female who presents for poor p.o. intake secondary to canker sores of her mouth.  Patient states that these canker sores did begin after she started using ODT Zofran.  Patient has no known allergy but was recommended to stop this Zofran medication in order to assess for improvement in patient's canker sores.  Will prescribe Magic mouthwash and Reglan to replace the Zofran medication.  Patient agrees with plan for discharge home and primary care follow-up.  Dispo: Discharge home with PCP follow-up   FINAL CLINICAL IMPRESSION(S) / ED DIAGNOSES   Final diagnoses:  Canker sores oral   Rx / DC Orders   ED Discharge Orders          Ordered    magic mouthwash (lidocaine, diphenhydrAMINE, alum & mag hydroxide) suspension  4 times daily,   Status:  Discontinued        08/03/22 1447    metoCLOPramide (REGLAN) 10 MG tablet  3 times daily with meals,   Status:  Discontinued        08/03/22 1447    magic  mouthwash (lidocaine, diphenhydrAMINE, alum & mag hydroxide) suspension  4 times daily        08/03/22 1447    metoCLOPramide (REGLAN) 10 MG tablet  3 times daily with meals        08/03/22 1447           Note:  This document was prepared using Dragon voice recognition software and may include unintentional dictation errors.   Naaman Plummer, MD 08/03/22 [phone]

## 2022-08-03 NOTE — ED Triage Notes (Signed)
Patient to ED via [facility] from home for N/V/D x2 weeks. Patient hasn't been able to take anything by mouth x2 days due to sores on month per patient.   EMS VS: 132/96 60 HR 97% RA 122 cbg

## 2022-08-05 ENCOUNTER — Other Ambulatory Visit: Payer: Self-pay | Admitting: Family

## 2022-08-05 DIAGNOSIS — I10 Essential (primary) hypertension: Secondary | ICD-10-CM

## 2022-08-06 ENCOUNTER — Telehealth: Payer: Self-pay

## 2022-08-06 NOTE — Telephone Encounter (Signed)
ER precautions given and pt verbalized understanding

## 2022-08-06 NOTE — Telephone Encounter (Signed)
Thank you Dr. Glori Bickers for seeing her while I am out.

## 2022-08-06 NOTE — Telephone Encounter (Signed)
Pt called (HIPAA verified)the patient states the canker sores dried up when she followed instructions of ER MD but still has diarrhea (greater than 10 stools per day). Pt has an appt tomorrow am with Dr Glori Bickers. Pt relays she has transportation to her appt. Emphasized importance of keeping her appt as she continues to has nausea and diarrhea.

## 2022-08-06 NOTE — Telephone Encounter (Signed)
Pleaes give ER precautions (dehydration/abd pain) Otherwise will see her then

## 2022-08-07 ENCOUNTER — Ambulatory Visit (INDEPENDENT_AMBULATORY_CARE_PROVIDER_SITE_OTHER): Payer: 59 | Admitting: Family Medicine

## 2022-08-07 ENCOUNTER — Encounter: Payer: Self-pay | Admitting: Family Medicine

## 2022-08-07 VITALS — BP 124/62 | HR 58 | Temp 97.9°F | Ht 62.0 in

## 2022-08-07 DIAGNOSIS — R8290 Unspecified abnormal findings in urine: Secondary | ICD-10-CM

## 2022-08-07 DIAGNOSIS — R300 Dysuria: Secondary | ICD-10-CM | POA: Diagnosis not present

## 2022-08-07 DIAGNOSIS — F419 Anxiety disorder, unspecified: Secondary | ICD-10-CM

## 2022-08-07 DIAGNOSIS — F319 Bipolar disorder, unspecified: Secondary | ICD-10-CM

## 2022-08-07 DIAGNOSIS — R197 Diarrhea, unspecified: Secondary | ICD-10-CM

## 2022-08-07 DIAGNOSIS — R112 Nausea with vomiting, unspecified: Secondary | ICD-10-CM

## 2022-08-07 DIAGNOSIS — D509 Iron deficiency anemia, unspecified: Secondary | ICD-10-CM

## 2022-08-07 DIAGNOSIS — K1379 Other lesions of oral mucosa: Secondary | ICD-10-CM | POA: Insufficient documentation

## 2022-08-07 MED ORDER — PROMETHAZINE HCL 12.5 MG PO TABS: 12.5000 mg | ORAL_TABLET | Freq: Three times a day (TID) | ORAL | 0 refills | Status: DC | PRN

## 2022-08-07 NOTE — Assessment & Plan Note (Addendum)
Mostly nausea and dry heaves but occasional vomiting  This began per pt after abx tx for boils early in march  Diarrhea as well/ no blood  She also thinks anxiety may worsen the problem- planning to get est with new psychiatrist soon  Has GERD taking high dose ppi bid-she thinks this is in good control and not contrib to her symptoms   Today reviewed GI notes and labs/studies from [facility] as well as colonoscopy /EGD report from Dr Vicente Males in 2019  Also rev recent CT abd and pelvis    Zofran may have caused mouth ulcers  The reglan from the ER did not help Px phenergan 12.5 mg to try with caution of sedation  Disc s/s of dehydration /enc her to keep drinking fluids   Ua to r/o uti Lab today  Then f/u with pcp and GI

## 2022-08-07 NOTE — Assessment & Plan Note (Signed)
Assoc with bipolar disorder  She is in the process of getting new psychiatrist  She thinks anxiety may add to her sub acute nausea

## 2022-08-07 NOTE — Patient Instructions (Addendum)
Labs today for nausea and diarrhea   Urinalysis today   Try the generic phenergan instead of reglan to see if it works better for nausea   Check in with GI doctor as well Stay hydrated    Get established with your new psychiatrist also

## 2022-08-07 NOTE — Assessment & Plan Note (Addendum)
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 If needs to r/o UC) Also nausea-unsure if related Is hydrating Taking a probiotid  Used lomotil short term and then switched to immodium prn  Eating bland  Lab today

## 2022-08-07 NOTE — Assessment & Plan Note (Addendum)
Recent  Also nausea   Will try to get urine sample today (if unable then return a sample tomorrow)    Addendum U cx pos for e coli  Will try and tx with keflex

## 2022-08-07 NOTE — Assessment & Plan Note (Signed)
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

## 2022-08-07 NOTE — Progress Notes (Signed)
Subjective:    Patient ID: Sydney Evans, female    DOB: 10-12-1950, 72 y.o.   MRN: [id number]  HPI 72 yo pt of NP Dug[facility] presents with c/o mouth sore and diarrhea and nausea and anxiety   She has h/o HSV Smoker PVD with amputation  Bipolar  Very complex medical/ GI history and psych history   Bigger problem now is nausea    Wt Readings from Last 3 Encounters:  08/03/22 167 lb 8.8 oz (76 kg)  07/16/22 154 lb 5.2 oz (70 kg)  05/10/22 154 lb 5.2 oz (70 kg)   30.65 kg/m  Vitals:   08/07/22 1508  BP: 124/62  Pulse: (!) 58  Temp: 97.9 F (36.6 C)  SpO2: 95%     She was 08/03/22 [facility] ER c/o canker sores in mouth for 2 weeks that limited her eating (causing poor oral intake) She thought they may have been a side effect of taking odt zofran  Stopping that did really help That is much better    Some urinary symptoms for 2 weeks Urgency and then cannot urinate  Some dysuria  No blood in urine   Lab Results  Component Value Date   WBC 8.3 08/03/2022   HGB 10.0 (L) 08/03/2022   HCT 35.3 (L) 08/03/2022   MCV 72.6 (L) 08/03/2022   PLT 412 (H) [id number]   Last metabolic panel Lab Results  Component Value Date   GLUCOSE 110 (H) 08/03/2022   NA 138 08/03/2022   K 3.7 08/03/2022   CL 103 08/03/2022   CO2 23 08/03/2022   BUN 8 08/03/2022   CREATININE 0.82 08/03/2022   GFRNONAA >60 08/03/2022   CALCIUM 9.1 08/03/2022   PHOS 3.8 06/25/2021   PROT 7.3 08/03/2022   ALBUMIN 3.5 08/03/2022   LABGLOB 2.2 03/17/2020   AGRATIO 1.8 03/17/2020   BILITOT 1.0 08/03/2022   ALKPHOS 62 08/03/2022   AST 33 08/03/2022   ALT 12 08/03/2022   ANIONGAP 12 08/03/2022    Magic mouthwash with lidocaine  was px for sores  Reglan was px for nausea to replace zofran   Smoking status   She has h/o IBS with diarrhea Was seen at [facility] on 07/24/22  Had suffered from diarrhea after taking abx for boils on march 3 Has remote h/o SBO after shoulder surgery last June   Used lomotil   Then ran out  Then used equate brand of immodium  This helps as needed   Has not had a regular bm in over a month   Per pt Small bowel bact overgrowth   Is staying with very bland food  Wants to eat real food now  Taking everything out of her   Reglan helped tiny bit but not much  Is taking probiotics - for 15 days    C diff test on 3/12 was negative GI panel neg at that time for infection as well   CT of abd/pelvis on 3/12  Narrative & Impression  CLINICAL DATA:  Abdominal pain   EXAM: CT ABDOMEN AND PELVIS WITH CONTRAST   TECHNIQUE: Multidetector CT imaging of the abdomen and pelvis was performed using the standard protocol following bolus administration of intravenous contrast.   RADIATION DOSE REDUCTION: This exam was performed according to the departmental dose-optimization program which includes automated exposure control, adjustment of the mA and/or kV according to patient size and/or use of iterative reconstruction technique.   CONTRAST:  153mL OMNIPAQUE IOHEXOL 300 MG/ML  SOLN  COMPARISON:  CT 05/10/2022 and previous   FINDINGS: Lower chest: No pleural or pericardial effusion. Visualized portions of lung bases clear.   Hepatobiliary: Cholecystectomy clips. Stable mild central intrahepatic biliary ductal dilatation, left greater than right. No focal liver lesion.   Pancreas: Unremarkable. No pancreatic ductal dilatation or surrounding inflammatory changes.   Spleen: Normal in size without focal abnormality.   Adrenals/Urinary Tract: Adrenal glands are unremarkable. Kidneys are normal, without renal calculi, focal lesion, or hydronephrosis. Bladder is unremarkable.   Stomach/Bowel: Stomach is nondistended, unremarkable. Small bowel decompressed. Normal appendix. Colon is partially distended by gas and fecal material, with scattered descending and sigmoid diverticula; no adjacent inflammatory change.   Vascular/Lymphatic: Moderate calcified  aortoiliac atheromatous plaque without aneurysm. Portal vein patent. No abdominal or pelvic adenopathy.   Reproductive: Status post hysterectomy. No adnexal masses.   Other: Bilateral pelvic phleboliths.  No ascites.  No free air.   Musculoskeletal: Mild multilevel lumbar spondylitic change. Previous cement vertebral augmentation T9 and T10. No acute findings.   IMPRESSION: 1. No acute findings. 2. Descending and sigmoid diverticulosis. 3. Stable mild central intrahepatic biliary ductal dilatation post cholecystectomy. 4.  Aortic Atherosclerosis (ICD10-I70.0).    H/o iron def  Declined tx for this  Lab Results  Component Value Date   IRON 36 (L) 08/01/2021   TIBC 404.6 08/01/2021   FERRITIN 24.2 08/01/2021   Last colonoscopy was with Dr Vicente Males 04/2018  Noted few tics  Otherwise nl  Recall was 5 years    Last GI clinic a/p   Irritable bowel syndrome with diarrhea  Suspect post antibiotic diarrhea. Before retesting for SIBO, will recommend conservative treatment with prn anti-diarrheals. Again recommended that she quit smoking, especially in light of right BKA but she has no interest in quitting at this time. Return to clinic in three months to assess symptoms.   Nausea  Worse in the am  Usually dry heaves  Occasionally does vomit  Has bipolar  Her mood may make the GI symptoms worse  Getting a new psychiatrist soon   Patient Active Problem List   Diagnosis Date Noted   Nausea & vomiting 08/07/2022   Diarrhea 08/07/2022   Dysuria 08/07/2022   Mouth sores 08/07/2022   Herpes simplex vulvovaginitis 06/11/2022   Vaginal lesion 06/11/2022   Anemia 05/22/2022   Weakness 05/21/2022   PAD (peripheral artery disease) 03/26/2022   Cervicalgia 02/06/2022   Activity of daily living alteration 11/27/2021   Anterolisthesis of cervical spine 11/13/2021   Facet arthropathy, cervical 11/13/2021   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/04/2021   History of cervical cancer 09/03/2021    History of uterine cancer 09/03/2021   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   Critical limb ischemia of right lower extremity 08/20/2021   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/01/2021   Open-angle glaucoma 08/01/2021   Hypocalcemia 08/01/2021   Age-related osteoporosis without current pathological fracture 08/01/2021   Vitamin D deficiency 08/01/2021   Acquired absence of right lower extremity below knee 08/01/2021   Drug-induced constipation 08/01/2021   Limitation of activity due to disability 08/01/2021   Need for assistance with personal care 08/01/2021   Sleep disturbance 10/06/2020   Duodenal nodule 03/17/2020   Bipolar depression 11/17/2019   Anxiety 11/17/2019   Hypokalemia 10/07/2019   Hypomagnesemia 10/07/2019   NSVT (nonsustained ventricular tachycardia)    Iron deficiency anemia 10/05/2017   Carotid stenosis 11/17/2016   GERD (gastroesophageal reflux disease) 11/17/2016   Essential hypertension 11/17/2016   Hyperlipidemia 11/17/2016   History of TIAs  07/17/2015   Localized, 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   Anemia    Anxiety    Arthritis    Barrett's esophagus    Bipolar disorder    Blocked artery    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   Blood clot in vein    Cancer    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   Carotid arterial disease    Contracture of joint of upper arm    Critical limb ischemia of right lower extremity 06/21/2021   Depression    Dislocation of right shoulder joint 10/06/2019   Disorder of bone and articular cartilage 09/29/2014   Elevated lipids    GERD (gastroesophageal reflux disease)    Hypertension    Migraine    Osteoporosis    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   Poor balance    Rotator cuff tendinitis, left 05/20/2016   Sinus congestion    Status post total shoulder arthroplasty, 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   Status post total shoulder replacement, 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    Stroke    x 2   TIA (transient ischemic attack)    Traumatic complete tear of right rotator cuff 10/08/2019   Vertigo    Past Surgical History:  Procedure Laterality Date   ABDOMINAL HYSTERECTOMY     pt states might still have ovaries   bka right     BREAST BIOPSY Left    CARPAL TUNNEL RELEASE Bilateral    CATARACT EXTRACTION W/ INTRAOCULAR LENS  IMPLANT, BILATERAL Bilateral    CHOLECYSTECTOMY     COLONOSCOPY WITH PROPOFOL N/A 04/09/2018   Procedure: COLONOSCOPY WITH PROPOFOL;  Surgeon: Jonathon Bellows, MD;  Location: [facility] ENDOSCOPY;  Service: Gastroenterology;  Laterality: N/A;   crystal cyst removed on left foot     ESOPHAGOGASTRODUODENOSCOPY (EGD) WITH PROPOFOL N/A 04/09/2018   Procedure: ESOPHAGOGASTRODUODENOSCOPY (EGD) WITH PROPOFOL;  Surgeon: Jonathon Bellows, MD;  Location: [facility] ENDOSCOPY;  Service: Gastroenterology;  Laterality: N/A;   ESOPHAGOGASTRODUODENOSCOPY (EGD) WITH PROPOFOL N/A 12/14/2019   Procedure: ESOPHAGOGASTRODUODENOSCOPY (EGD) WITH PROPOFOL;  Surgeon: Lesly Rubenstein, MD;  Location: [facility] ENDOSCOPY;  Service: Endoscopy;  Laterality: N/A;   EYE SURGERY     femoral fx     FOOT SURGERY     GIVENS CAPSULE STUDY N/A 06/16/2018   Procedure: GIVENS CAPSULE STUDY;  Surgeon: Jonathon Bellows, MD;  Location: [facility] ENDOSCOPY;  Service: Gastroenterology;  Laterality: N/A;   JOINT REPLACEMENT     KNEE SURGERY     KYPHOPLASTY     LOWER EXTREMITY ANGIOGRAPHY Right 06/21/2021   Procedure: Lower Extremity Angiography;  Surgeon: Katha Cabal, MD;  Location: [place] CV LAB;  Service: Cardiovascular;  Laterality: Right;   ORIF PERIPROSTHETIC FRACTURE Right 10/07/2019   Procedure: OPEN REDUCTION INTERNAL FIXATION (ORIF) PERIPROSTHETIC FRACTURE;  Surgeon: Corky Mull, MD;  Location: [facility] ORS;  Service: Orthopedics;  Laterality: Right;   TONSILLECTOMY     TOTAL KNEE ARTHROPLASTY Bilateral    TOTAL SHOULDER ARTHROPLASTY Left 08/01/2016   Procedure: TOTAL SHOULDER ARTHROPLASTY;   Surgeon: Corky Mull, MD;  Location: [facility] ORS;  Service: Orthopedics;  Laterality: Left;   TOTAL SHOULDER ARTHROPLASTY Right 09/30/2019   Procedure: TOTAL SHOULDER ARTHROPLASTY;  Surgeon: Corky Mull, MD;  Location: [facility] ORS;  Service: Orthopedics;  Laterality: Right;   Social History   Tobacco Use   Smoking status: Every Day    Packs/day: .5    Types: Cigarettes    Last  attempt to quit: 08/04/2016    Years since quitting: 6.0   Smokeless tobacco: Never   Tobacco comments:    1 pack lasts about 2.5 days  Vaping Use   Vaping Use: Never used  Substance Use Topics   Alcohol use: No   Drug use: No   Family History  Problem Relation Age of Onset   Other Mother        ?lupus    Cancer Mother    High Cholesterol Mother    Alcohol abuse Father    CAD Father        CABG   High Cholesterol Father    Arthritis Father    Heart murmur Sister    Bradycardia Sister    Heart murmur Brother    High blood pressure Other        "for everybody"   High Cholesterol Other        "for everybody"   Allergies  Allergen Reactions   Hydroxyzine Hives and Rash   Tramadol Hives   Zofran [Ondansetron]     Broke mouth out   Dilaudid [Hydromorphone] Itching   Hydrocodone Hives and Rash    "terrible scratching"  Make take with benadryl   Ketorolac Rash     May take with benadryl   Percocet [Oxycodone-Acetaminophen] Itching and Rash    May take with benadryl     Toradol [Ketorolac Tromethamine] Rash    May take with benadryl   Current Outpatient Medications on File Prior to Visit  Medication Sig Dispense Refill   acetaminophen (TYLENOL) 500 MG tablet Take 500 mg by mouth every 6 (six) hours as needed.     albuterol (VENTOLIN HFA) 108 (90 Base) MCG/ACT inhaler Inhale 2 puffs into the lungs every 4 (four) hours as needed for shortness of breath. 1 each 0   amLODipine (NORVASC) 5 MG tablet Take 1 tablet (5 mg total) by mouth daily. 90 tablet 3   Carboxymethylcellulose Sodium (LUBRICANT EYE  DROPS OP) Place 1 drop into both eyes daily as needed (Dry eye). Systane     citalopram (CELEXA) 40 MG tablet Take 1 tablet (40 mg total) by mouth daily. 90 tablet 0   diphenhydrAMINE (BENADRYL) 25 MG tablet Take 25 mg by mouth every 6 (six) hours as needed for itching.     diphenoxylate-atropine (LOMOTIL) 2.5-0.025 MG tablet Take 1-2 tablets by mouth 4 (four) times daily as needed for diarrhea or loose stools (max 8 tabs per day). 30 tablet 0   fluticasone (FLONASE) 50 MCG/ACT nasal spray Place 2 sprays into both nostrils daily as needed for allergies or rhinitis. 16 g 12   gabapentin (NEURONTIN) 300 MG capsule Take 1 capsule (300 mg total) by mouth 4 (four) times daily. 360 capsule 3   ibuprofen (ADVIL) 800 MG tablet Take 800 mg by mouth every 8 (eight) hours as needed.     lamoTRIgine (LAMICTAL) 200 MG tablet Take 1 tablet (200 mg total) by mouth daily. 90 tablet 0   latanoprost (XALATAN) 0.005 % ophthalmic solution SMARTSIG:In Eye(s)     methocarbamol (ROBAXIN) 500 MG tablet Take 500 mg by mouth 3 (three) times daily.     omeprazole (PRILOSEC) 20 MG capsule Take 1 capsule (20 mg total) by mouth 2 (two) times daily before a meal. (Patient taking differently: Take 40 mg by mouth 2 (two) times daily before a meal.) 180 capsule 0   oxyCODONE-acetaminophen (PERCOCET) 10-325 MG tablet Take 1 tablet by mouth every 4 (four) hours as  needed for pain.     Probiotic Product (FORTIFY PROBIOTIC WOMENS PO) Take 1 Capful by mouth daily.     propranolol (INDERAL) 20 MG tablet Take 20 mg by mouth daily as needed.     valACYclovir (VALTREX) 500 MG tablet Take 1 tablet (500 mg total) by mouth daily. 90 tablet 1   ARIPiprazole (ABILIFY) 5 MG tablet Take 1 tablet (5 mg total) by mouth at bedtime. 30 tablet 1   dicyclomine (BENTYL) 10 MG capsule Take 1 capsule (10 mg total) by mouth 4 (four) times daily -  before meals and at bedtime for 5 days. 20 capsule 0   No current facility-administered medications on file  prior to visit.    Review of Systems  Constitutional:  Positive for activity change, appetite change and fatigue. Negative for fever and unexpected weight change.  HENT:  Negative for congestion, ear pain, rhinorrhea, sinus pressure and sore throat.   Eyes:  Negative for pain, redness and visual disturbance.  Respiratory:  Negative for cough, shortness of breath and wheezing.   Cardiovascular:  Negative for chest pain and palpitations.  Gastrointestinal:  Positive for diarrhea, nausea and vomiting. Negative for abdominal distention, abdominal pain, anal bleeding, blood in stool, constipation and rectal pain.  Endocrine: Negative for polydipsia and polyuria.  Genitourinary:  Positive for difficulty urinating and dysuria. Negative for frequency, hematuria, urgency and vaginal pain.  Musculoskeletal:  Negative for arthralgias, back pain and myalgias.  Skin:  Negative for pallor and rash.  Allergic/Immunologic: Negative for environmental allergies.  Neurological:  Negative for dizziness, syncope and headaches.  Hematological:  Negative for adenopathy. Does not bruise/bleed easily.  Psychiatric/Behavioral:  Positive for dysphoric mood and sleep disturbance. Negative for decreased concentration. The patient is nervous/anxious.        Objective:   Physical Exam Constitutional:      General: She is not in acute distress.    Appearance: Normal appearance. She is well-developed. She is obese. She is not ill-appearing.     Comments: Frail but not ill appearing In wheelchair    HENT:     Head: Normocephalic and atraumatic.     Nose: No rhinorrhea.     Mouth/Throat:     Pharynx: No oropharyngeal exudate or posterior oropharyngeal erythema.     Comments: Mucous membranes are slightly dry  Throat is clear No mouth or lip ulcers  Eyes:     General: No scleral icterus.       Right eye: No discharge.        Left eye: No discharge.     Conjunctiva/sclera: Conjunctivae normal.     Pupils: Pupils  are equal, round, and reactive to light.  Neck:     Thyroid: No thyromegaly.     Vascular: No carotid bruit or JVD.  Cardiovascular:     Rate and Rhythm: Regular rhythm. Bradycardia present.     Heart sounds: Normal heart sounds.     No gallop.  Pulmonary:     Effort: Pulmonary effort is normal. No respiratory distress.     Breath sounds: Normal breath sounds. No wheezing or rales.     Comments: Diffusely distant bs  Abdominal:     General: Bowel sounds are increased. There is no distension or abdominal bruit.     Palpations: Abdomen is soft. There is no fluid wave, hepatomegaly, mass or pulsatile mass.     Tenderness: There is no abdominal tenderness. There is no right CVA tenderness, left CVA tenderness, guarding or rebound.  Negative signs include Murphy's sign and McBurney's sign.     Comments: Bs are mildly increased Not high pitched   Musculoskeletal:     Cervical back: Normal range of motion and neck supple.     Right lower leg: No edema.     Left lower leg: No edema.  Lymphadenopathy:     Cervical: No cervical adenopathy.  Skin:    General: Skin is warm and dry.     Coloration: Skin is not jaundiced or pale.     Findings: No bruising or rash.  Neurological:     Mental Status: She is alert.     Coordination: Coordination normal.     Deep Tendon Reflexes: Reflexes are normal and symmetric. Reflexes normal.  Psychiatric:        Mood and Affect: Mood is anxious and depressed.           Assessment & Plan:   Problem List Items Addressed This Visit       Digestive   Mouth sores    Reviewed recent ER visit 08/02/32 at [place] for severe mouth sores  Thought to be due to poss allergy to zofran odt-stopped that  These are entirely resolved with magic mouthwash   Reassuring exam Able to drink fluids  Her GI symptoms continue (IBS and nausea)  I do not see h/o UC in her chart but if the mouth ulcers continue may need to rule that out  Looks like last colonoscopy was  clear 2019        Nausea & vomiting - Primary    Mostly nausea and dry heaves but occasional vomiting  This began per pt after abx tx for boils early in march  Diarrhea as well/ no blood  She also thinks anxiety may worsen the problem- planning to get est with new psychiatrist soon  Has GERD taking high dose ppi bid-she thinks this is in good control and not contrib to her symptoms   Today reviewed GI notes and labs/studies from [facility] as well as colonoscopy /EGD report from Dr Vicente Males in 2019  Also rev recent CT abd and pelvis    Zofran may have caused mouth ulcers  The reglan from the ER did not help Px phenergan 12.5 mg to try with caution of sedation  Disc s/s of dehydration /enc her to keep drinking fluids   Ua to r/o uti Lab today  Then f/u with pcp and GI      Relevant Orders   Comprehensive metabolic panel   CBC with Differential/Platelet   POCT Urinalysis Dipstick (Automated)     Other   Anxiety    Assoc with bipolar disorder  She is in the process of getting new psychiatrist  She thinks anxiety may add to her sub acute nausea       Bipolar depression   Diarrhea    Pt has diarrhea predominant IBS Also possible SB bact overgrowth  Reviewed recent GI notes and plan (enc her to follow up early)  Rev last CT abd and nl stool studies for c diff and stool panel Rev last colonosocpy 2019 - fairly nl  with 5 y recall (recent mouth sores- ? If needs to r/o UC) Also nausea-unsure if related Is hydrating Taking a probiotid  Used lomotil short term and then switched to immodium prn  Eating bland  Lab today       Relevant Orders   Comprehensive metabolic panel   CBC with Differential/Platelet   Dysuria  Recent  Also nausea   Will try to get urine sample today (if unable then return a sample tomorrow)       Relevant Orders   POCT Urinalysis Dipstick (Automated)   Iron deficiency anemia    Pt (new to me today) says she does not improve at all with iron infusions  so she quit them This is chronic since childhood

## 2022-08-07 NOTE — Assessment & Plan Note (Addendum)
Reviewed recent ER visit 08/02/32 at [place] for severe mouth sores  Thought to be due to poss allergy to zofran odt-stopped that  These are entirely resolved with magic mouthwash   Reassuring exam Able to drink fluids  Her GI symptoms continue (IBS and nausea)  I do not see h/o UC in her chart but if the mouth ulcers continue may need to rule that out  Looks like last colonoscopy was clear 2019

## 2022-08-08 ENCOUNTER — Telehealth: Payer: Self-pay | Admitting: Family

## 2022-08-08 LAB — COMPREHENSIVE METABOLIC PANEL
ALT: 9 U/L (ref 0–35)
AST: 16 U/L (ref 0–37)
Albumin: 3.8 g/dL (ref 3.5–5.2)
Alkaline Phosphatase: 61 U/L (ref 39–117)
BUN: 9 mg/dL (ref 6–23)
CO2: 25 mEq/L (ref 19–32)
Calcium: 8.8 mg/dL (ref 8.4–10.5)
Chloride: 105 mEq/L (ref 96–112)
Creatinine, Ser: 0.9 mg/dL (ref 0.40–1.20)
GFR: 65.05 mL/min (ref >60.00)
Glucose, Bld: 95 mg/dL (ref 70–99)
Potassium: 3.6 mEq/L (ref 3.5–5.1)
Sodium: 138 mEq/L (ref 135–145)
Total Bilirubin: 0.4 mg/dL (ref 0.2–1.2)
Total Protein: 6.8 g/dL (ref 6.0–8.3)

## 2022-08-08 LAB — POC URINALSYSI DIPSTICK (AUTOMATED)
Bilirubin, UA: NEGATIVE
Blood, UA: 50 — AB
Glucose, UA: NEGATIVE
Ketones, UA: NEGATIVE
Nitrite, UA: NEGATIVE
Protein, UA: POSITIVE — AB
Spec Grav, UA: 1.01 (ref 1.010–1.025)
Urobilinogen, UA: 0.2 E.U./dL
pH, UA: 6.5 (ref 5.0–8.0)

## 2022-08-08 NOTE — Telephone Encounter (Signed)
Home Health verbal orders Caller Name: Phil Dopp Agency Name: [place] number: [phone]  Requesting OT/PT/Skilled nursing/Social Work/Speech: OT for one week  Reason: to assist patient with her ADLs and for therapeutic exercises   Frequency: one   Please forward to [facility] pool or providers CMA

## 2022-08-08 NOTE — Addendum Note (Signed)
Addended by: Ellamae Sia on: 08/08/2022 10:57 AM   Modules accepted: Orders

## 2022-08-08 NOTE — Addendum Note (Signed)
Addended by: Tammi Sou on: 08/08/2022 04:33 PM   Modules accepted: Orders

## 2022-08-09 ENCOUNTER — Other Ambulatory Visit (INDEPENDENT_AMBULATORY_CARE_PROVIDER_SITE_OTHER): Payer: 59

## 2022-08-09 DIAGNOSIS — R112 Nausea with vomiting, unspecified: Secondary | ICD-10-CM | POA: Diagnosis not present

## 2022-08-09 DIAGNOSIS — R197 Diarrhea, unspecified: Secondary | ICD-10-CM | POA: Diagnosis not present

## 2022-08-09 LAB — CBC WITH DIFFERENTIAL/PLATELET
Basophils Absolute: 0.1 10*3/uL (ref 0.0–0.1)
Basophils Relative: 1.4 % (ref 0.0–3.0)
Eosinophils Absolute: 0.2 10*3/uL (ref 0.0–0.7)
Eosinophils Relative: 2.7 % (ref 0.0–5.0)
HCT: 33.5 % — ABNORMAL LOW (ref 36.0–46.0)
Hemoglobin: 10.3 g/dL — ABNORMAL LOW (ref 12.0–15.0)
Lymphocytes Relative: 20.9 % (ref 12.0–46.0)
Lymphs Abs: 1.3 10*3/uL (ref 0.7–4.0)
MCHC: 30.9 g/dL (ref 30.0–36.0)
MCV: 68.1 fl — ABNORMAL LOW (ref 78.0–100.0)
Monocytes Absolute: 0.4 10*3/uL (ref 0.1–1.0)
Monocytes Relative: 6 % (ref 3.0–12.0)
Neutro Abs: 4.3 10*3/uL (ref 1.4–7.7)
Neutrophils Relative %: 69 % (ref 43.0–77.0)
Platelets: 424 10*3/uL — ABNORMAL HIGH (ref 150.0–400.0)
RBC: 4.92 Mil/uL (ref 3.87–5.11)
RDW: 21 % — ABNORMAL HIGH (ref 11.5–15.5)
WBC: 6.3 10*3/uL (ref 4.0–10.5)

## 2022-08-10 LAB — URINE CULTURE
MICRO NUMBER:: 14783073
SPECIMEN QUALITY:: ADEQUATE

## 2022-08-11 MED ORDER — CEPHALEXIN 500 MG PO CAPS: 500.0000 mg | ORAL_CAPSULE | Freq: Two times a day (BID) | ORAL | 0 refills | Status: DC

## 2022-08-11 NOTE — Addendum Note (Signed)
Addended by: Roxy Manns A on: 08/11/2022 10:45 AM   Modules accepted: Orders

## 2022-08-12 NOTE — Telephone Encounter (Signed)
Spoke with : Roma Kayser at [facility]. Provided the verbal orders for OT/PT/Skilled nursing/Social Work/Speech

## 2022-08-12 NOTE — Progress Notes (Signed)
Ok  Did pt start on antbx today or yesterday? If she starts with any fever, chills, and worsening back pain go to ER. Has she been vomited for some time or only with UTI symptoms?

## 2022-08-12 NOTE — Telephone Encounter (Signed)
Patient returned Shannon's call. Advised that [facility] wasn't available and would call her back. Please advise [phone]

## 2022-08-12 NOTE — Progress Notes (Signed)
noted 

## 2022-08-13 NOTE — Telephone Encounter (Signed)
See results note. 

## 2022-08-14 NOTE — Progress Notes (Signed)
noted 
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
## 2022-08-19 ENCOUNTER — Other Ambulatory Visit: Payer: Self-pay | Admitting: Family

## 2022-08-19 ENCOUNTER — Ambulatory Visit: Payer: 59 | Admitting: Psychiatry

## 2022-08-19 DIAGNOSIS — K219 Gastro-esophageal reflux disease without esophagitis: Secondary | ICD-10-CM

## 2022-08-22 ENCOUNTER — Emergency Department
Admission: EM | Admit: 2022-08-22 | Discharge: 2022-08-23 | Disposition: A | Discharge status: Home / Self Care | Payer: 59 | Attending: Emergency Medicine | Admitting: Emergency Medicine

## 2022-08-22 ENCOUNTER — Emergency Department: Payer: 59

## 2022-08-22 DIAGNOSIS — M545 Low back pain, unspecified: Secondary | ICD-10-CM | POA: Insufficient documentation

## 2022-08-22 DIAGNOSIS — R1031 Right lower quadrant pain: Secondary | ICD-10-CM | POA: Insufficient documentation

## 2022-08-22 DIAGNOSIS — I10 Essential (primary) hypertension: Secondary | ICD-10-CM | POA: Insufficient documentation

## 2022-08-22 DIAGNOSIS — R1032 Left lower quadrant pain: Secondary | ICD-10-CM | POA: Diagnosis not present

## 2022-08-22 LAB — CBC WITH DIFFERENTIAL/PLATELET
Abs Immature Granulocytes: 0.03 10*3/uL (ref 0.00–0.07)
Basophils Absolute: 0.1 10*3/uL (ref 0.0–0.1)
Basophils Relative: 1 %
Eosinophils Absolute: 0.1 10*3/uL (ref 0.0–0.5)
Eosinophils Relative: 2 %
HCT: 30.6 % — ABNORMAL LOW (ref 36.0–46.0)
Hemoglobin: 8.9 g/dL — ABNORMAL LOW (ref 12.0–15.0)
Immature Granulocytes: 0 %
Lymphocytes Relative: 32 %
Lymphs Abs: 2.5 10*3/uL (ref 0.7–4.0)
MCH: 20.7 pg — ABNORMAL LOW (ref 26.0–34.0)
MCHC: 29.1 g/dL — ABNORMAL LOW (ref 30.0–36.0)
MCV: 71.3 fL — ABNORMAL LOW (ref 80.0–100.0)
Monocytes Absolute: 0.5 10*3/uL (ref 0.1–1.0)
Monocytes Relative: 6 %
Neutro Abs: 4.5 10*3/uL (ref 1.7–7.7)
Neutrophils Relative %: 59 %
Platelets: 446 10*3/uL — ABNORMAL HIGH (ref 150–400)
RBC: 4.29 MIL/uL (ref 3.87–5.11)
RDW: 20.2 % — ABNORMAL HIGH (ref 11.5–15.5)
WBC: 7.7 10*3/uL (ref 4.0–10.5)
nRBC: 0 % (ref 0.0–0.2)

## 2022-08-22 LAB — COMPREHENSIVE METABOLIC PANEL
ALT: 11 U/L (ref 0–44)
AST: 21 U/L (ref 15–41)
Albumin: 3.1 g/dL — ABNORMAL LOW (ref 3.5–5.0)
Alkaline Phosphatase: 59 U/L (ref 38–126)
Anion gap: 10 (ref 5–15)
BUN: 12 mg/dL (ref 8–23)
CO2: 20 mmol/L — ABNORMAL LOW (ref 22–32)
Calcium: 8.3 mg/dL — ABNORMAL LOW (ref 8.9–10.3)
Chloride: 105 mmol/L (ref 98–111)
Creatinine, Ser: 0.86 mg/dL (ref 0.44–1.00)
GFR, Estimated: >60 mL/min (ref >60)
Glucose, Bld: 90 mg/dL (ref 70–99)
Potassium: 3.3 mmol/L — ABNORMAL LOW (ref 3.5–5.1)
Sodium: 135 mmol/L (ref 135–145)
Total Bilirubin: 0.8 mg/dL (ref 0.3–1.2)
Total Protein: 6.3 g/dL — ABNORMAL LOW (ref 6.5–8.1)

## 2022-08-22 LAB — URINALYSIS, ROUTINE W REFLEX MICROSCOPIC
Bilirubin Urine: NEGATIVE
Glucose, UA: NEGATIVE mg/dL
Hgb urine dipstick: NEGATIVE
Ketones, ur: NEGATIVE mg/dL
Leukocytes,Ua: NEGATIVE
Nitrite: NEGATIVE
Protein, ur: NEGATIVE mg/dL
Specific Gravity, Urine: 1.041 — ABNORMAL HIGH (ref 1.005–1.030)
pH: 6 (ref 5.0–8.0)

## 2022-08-22 LAB — LIPASE, BLOOD: Lipase: 27 U/L (ref 11–51)

## 2022-08-22 MED ORDER — IOHEXOL 300 MG/ML  SOLN
100.0000 mL | Freq: Once | INTRAMUSCULAR | Status: AC | PRN
  Administered 2022-08-22: 100 mL via INTRAVENOUS

## 2022-08-22 MED ORDER — FENTANYL CITRATE PF 50 MCG/ML IJ SOSY
50.0000 ug | PREFILLED_SYRINGE | Freq: Once | INTRAMUSCULAR | Status: AC
  Administered 2022-08-22: 50 ug via INTRAVENOUS
  Filled 2022-08-22: qty 1

## 2022-08-22 NOTE — ED Notes (Signed)
[phone] Dub Amis, caretaker)

## 2022-08-22 NOTE — ED Notes (Signed)
Pt in & out cathed by this RN with assistance from [place], [place]. Pt tolerated and urine sample labeled at the bedside and sent to the lab.

## 2022-08-22 NOTE — ED Provider Notes (Signed)
[facility] Provider Note  Patient Contact: 9:36 PM (approximate)   History   Back Pain   HPI  Sydney Evans is a 72 y.o. female with a history of anemia, migraines, depression, GERD, prior CVA and hypertension, presents to the emergency department with acute onset of low back pain that radiates to the left and right lower quadrants.  Patient denies dysuria or hematuria.  She denies a history of nephrolithiasis or pyelonephritis.  No associated vomiting or fever at home.      Physical Exam   Triage Vital Signs: ED Triage Vitals  Enc Vitals Group     BP 08/22/22 2118 (!) 152/76     Pulse Rate 08/22/22 2118 77     Resp 08/22/22 2118 20     Temp 08/22/22 2118 97.8 F (36.6 C)     Temp Source 08/22/22 2118 Oral     SpO2 08/22/22 2118 97 %     Weight 08/22/22 2120 164 lb (74.4 kg)     Height 08/22/22 2120  (1.575 m)     Head Circumference --      Peak Flow --      Pain Score 08/22/22 2122 10     Pain Loc --      Pain Edu? --      Excl. in GC? --     Most recent vital signs: Vitals:   08/22/22 2118  BP: (!) 152/76  Pulse: 77  Resp: 20  Temp: 97.8 F (36.6 C)  SpO2: 97%     General: Alert and in no acute distress. Eyes:  PERRL. EOMI. Head: No acute traumatic findings ENT:      Nose: No congestion/rhinnorhea.      Mouth/Throat: Mucous membranes are moist. Neck: No stridor. No cervical spine tenderness to palpation. Cardiovascular:  Good peripheral perfusion Respiratory: Normal respiratory effort without tachypnea or retractions. Lungs CTAB. Good air entry to the bases with no decreased or absent breath sounds. Gastrointestinal: Bowel sounds 4 quadrants. Soft and nontender to palpation. No guarding or rigidity. No palpable masses. No distention. No CVA tenderness. Musculoskeletal: Full range of motion to all extremities.  Neurologic:  No gross focal neurologic deficits are appreciated.  Skin:   No rash noted   ED Results /  Procedures / Treatments   Labs (all labs ordered are listed, but only abnormal results are displayed) Labs Reviewed  URINALYSIS, ROUTINE W REFLEX MICROSCOPIC - Abnormal; Notable for the following components:      Result Value   Color, Urine YELLOW (*)    APPearance CLEAR (*)    Specific Gravity, Urine 1.041 (*)    All other components within normal limits  CBC WITH DIFFERENTIAL/PLATELET - Abnormal; Notable for the following components:   Hemoglobin 8.9 (*)    HCT 30.6 (*)    MCV 71.3 (*)    MCH 20.7 (*)    MCHC 29.1 (*)    RDW 20.2 (*)    Platelets 446 (*)    All other components within normal limits  COMPREHENSIVE METABOLIC PANEL - Abnormal; Notable for the following components:   Potassium 3.3 (*)    CO2 20 (*)    Calcium 8.3 (*)    Total Protein 6.3 (*)    Albumin 3.1 (*)    All other components within normal limits  LIPASE, BLOOD      RADIOLOGY  I personally viewed and evaluated these images as part of my medical decision making, as well as reviewing  the written report by the radiologist.  ED Provider Interpretation: CT abdomen pelvis unremarkable.   PROCEDURES:  Critical Care performed: No  Procedures   MEDICATIONS ORDERED IN ED: Medications  fentaNYL (SUBLIMAZE) injection 50 mcg (50 mcg Intravenous Given 08/22/22 2204)  iohexol (OMNIPAQUE) 300 MG/ML solution 100 mL (100 mLs Intravenous Contrast Given 08/22/22 2249)     IMPRESSION / MDM / ASSESSMENT AND PLAN / ED COURSE  I reviewed the triage vital signs and the nursing notes.                              Assessment and plan Flank pain Abdominal pain:  72 year old female presents to the emergency department with bilateral flank pain that radiated to the right and left lower abdominal quadrants.  On exam, patient did have some right and left lower abdominal tenderness to palpation.  CBC and CMP largely consistent with patient's baseline.  Urinalysis shows no signs of UTI.  Lipase within range.  CT  abdomen pelvis unremarkable.  Patient significantly more comfortable after 50 mcg of fentanyl.  Patient was prescribed a 2-day course of Norco for pain.  Return precautions were given to return with new or worsening symptoms.     FINAL CLINICAL IMPRESSION(S) / ED DIAGNOSES   Final diagnoses:  Acute low back pain, unspecified back pain laterality, unspecified whether sciatica present     Rx / DC Orders   ED Discharge Orders     None        Note:  This document was prepared using Dragon voice recognition software and may include unintentional dictation errors.   Pia Mau [place], PA-C 08/23/22 0013    Georga Hacking, MD 08/28/22 [phone]

## 2022-08-22 NOTE — ED Triage Notes (Signed)
Pt states she is having lower back pain that radiates around to her side bilaterally. Pt denies hx kidney stones. Pt denies urinary symptoms.

## 2022-08-23 NOTE — Discharge Instructions (Signed)
I will call your pain medicine in to the pharmacy tomorrow.

## 2022-08-23 NOTE — ED Notes (Signed)
Pts niece contacted to provide the patient a ride home.

## 2022-08-26 ENCOUNTER — Telehealth: Payer: Self-pay

## 2022-08-26 NOTE — Telephone Encounter (Signed)
Noted  

## 2022-08-26 NOTE — Transitions of Care (Post Inpatient/ED Visit) (Signed)
I spoke with Wendy(DPR signed) and [name] advised that pt is alternating ice and heat to affected areas and that seems to be helping pain; if pt condition does not improve and pt needs to schedule appt pt will call and do so. UC & ED precautions given also and [name] voiced understanding. Sending note to T Dugal FNP.        08/26/2022  Name: Charletha Dalpe MRN: [id number] DOB: 1951/01/15  Today's TOC FU Call Status: Today's TOC FU Call Status:: Successful TOC FU Call Competed TOC FU Call Complete Date: 08/26/22  Transition Care Management Follow-up Telephone Call Date of Discharge: 08/23/22 Discharge Facility: [facility]) Type of Discharge: Emergency Department Reason for ED Visit: Other: (lower back pain and lower abd pain; some diarrhea; pt is alternating heat and ice pack with some relief of pain) How have you been since you were released from the hospital?: Same (low back pain in middle radiates to lower abd on and off; no fever; some diarrhea) Any questions or concerns?: No  Items Reviewed: Did you receive and understand the discharge instructions provided?: Yes Medications obtained and verified?: Yes (Medications Reviewed) Any new allergies since your discharge?: No Dietary orders reviewed?: NA Do you have support at home?: Yes People in Home: friend(s) Name of Support/Comfort Primary Source: [name] and surrounding neighbors  Home Care and Equipment/Supplies: Were Home Health Services Ordered?: NA Any new equipment or medical supplies ordered?: NA  Functional Questionnaire: Do you need assistance with bathing/showering or dressing?: Yes (rt leg amputation for 1 yr. pt does have prosthesis.) Do you need assistance with meal preparation?: No (not usually but if needs assistance pt has meals on wheels and neighbors and [name] help prepare some meals if needed.) Do you need assistance with eating?: No Do you have difficulty maintaining continence:  No Do you need assistance with getting out of bed/getting out of a chair/moving?: No Do you have difficulty managing or taking your medications?: No  Follow up appointments reviewed: PCP Follow-up appointment confirmed?: No (I offered an appt at [facility] and declined.pt will cb to schedule appt at [facility] if condition does not improve.) MD Provider Line Number:[phone] Given: Yes Date of PCP follow-up appointment?:  (has not scheduled PCP appt at this time.) [facility] Follow-up appointment confirmed?: NA Do you need transportation to your follow-up appointment?: No Do you understand care options if your condition(s) worsen?: Yes-patient verbalized understanding    SIGNATURE Lewanda Rife, LPN

## 2022-08-27 ENCOUNTER — Telehealth: Payer: Self-pay

## 2022-08-27 ENCOUNTER — Other Ambulatory Visit: Payer: Self-pay | Admitting: Psychiatry

## 2022-08-27 DIAGNOSIS — F319 Bipolar disorder, unspecified: Secondary | ICD-10-CM

## 2022-08-27 MED ORDER — LAMOTRIGINE 200 MG PO TABS
200.0000 mg | ORAL_TABLET | Freq: Every day | ORAL | 0 refills | Status: DC
Start: 2022-08-27 — End: 2022-11-05

## 2022-08-27 NOTE — Telephone Encounter (Signed)
received fax requesting a refill on the lamotrigine and the metoclopramide. pt was last seen on 2-15 and next appt 7-2.   Outpatient Medication Detail   Disp Refills Start End   lamoTRIgine (LAMICTAL) 200 MG tablet 90 tablet 0 05/24/2022 08/22/2022   Sig - Route: Take 1 tablet (200 mg total) by mouth daily. - Oral   Sent to pharmacy as: lamoTRIgine (LAMICTAL) 200 MG tablet   Notes to Pharmacy: Fill after 1/11   E-Prescribing Status: Receipt confirmed by pharmacy (04/22/2022 10:34 AM EST)     methocarbamol (ROBAXIN) 500 MG tablet Medication Date: 02/21/2022 Department: [facility] Documenting: Sharin Mons, CMA Authorizing: [provider]   Order Providers  Prescribing Provider Encounter Provider  [provider] Neysa Hotter, MD   Outpatient Medication Detail   Disp Refills Start End   methocarbamol (ROBAXIN) 500 MG tablet       Sig - Route: Take 500 mg by mouth 3 (three) times daily. - Oral   Class: Historical Med

## 2022-08-28 NOTE — Telephone Encounter (Signed)
Pt.notified

## 2022-08-29 ENCOUNTER — Other Ambulatory Visit: Payer: Self-pay

## 2022-08-29 DIAGNOSIS — I10 Essential (primary) hypertension: Secondary | ICD-10-CM

## 2022-09-02 ENCOUNTER — Other Ambulatory Visit: Payer: Self-pay | Admitting: Family

## 2022-09-02 ENCOUNTER — Telehealth: Payer: Self-pay | Admitting: Family

## 2022-09-02 DIAGNOSIS — L0292 Furuncle, unspecified: Secondary | ICD-10-CM

## 2022-09-02 NOTE — Addendum Note (Signed)
Addended by: Mort Sawyers on: 09/02/2022 01:51 PM   Modules accepted: Orders

## 2022-09-02 NOTE — Telephone Encounter (Signed)
Spoke with the patient and she will call back to schedule an appt. Will need to see when her ride can bring her in.

## 2022-09-02 NOTE — Telephone Encounter (Signed)
I prefer evaluation in person since she might have had an issue with medication prescribed previously causing th stomach upset. I don't want to prescribe anything if not necessary, I will also refer her to dermatology for recurrent boils.

## 2022-09-02 NOTE — Telephone Encounter (Signed)
Please advise. Thanks.  

## 2022-09-02 NOTE — Telephone Encounter (Signed)
Patient called in and stated that she is dealing with boils. She was wanting to know if Wyatt Mage would call her an antibiotic in or do she need to see her. Please advise. Thank you!

## 2022-09-05 ENCOUNTER — Telehealth: Payer: Self-pay

## 2022-09-05 ENCOUNTER — Encounter: Payer: Self-pay | Admitting: Family

## 2022-09-05 ENCOUNTER — Ambulatory Visit (INDEPENDENT_AMBULATORY_CARE_PROVIDER_SITE_OTHER): Payer: 59 | Admitting: Family

## 2022-09-05 VITALS — BP 136/76 | HR 82 | Temp 97.9°F | Ht 62.0 in | Wt 164.0 lb

## 2022-09-05 DIAGNOSIS — D500 Iron deficiency anemia secondary to blood loss (chronic): Secondary | ICD-10-CM | POA: Diagnosis not present

## 2022-09-05 DIAGNOSIS — E876 Hypokalemia: Secondary | ICD-10-CM | POA: Insufficient documentation

## 2022-09-05 DIAGNOSIS — R112 Nausea with vomiting, unspecified: Secondary | ICD-10-CM

## 2022-09-05 DIAGNOSIS — L989 Disorder of the skin and subcutaneous tissue, unspecified: Secondary | ICD-10-CM | POA: Diagnosis not present

## 2022-09-05 DIAGNOSIS — T781XXA Other adverse food reactions, not elsewhere classified, initial encounter: Secondary | ICD-10-CM | POA: Diagnosis not present

## 2022-09-05 DIAGNOSIS — L0292 Furuncle, unspecified: Secondary | ICD-10-CM

## 2022-09-05 DIAGNOSIS — K219 Gastro-esophageal reflux disease without esophagitis: Secondary | ICD-10-CM | POA: Diagnosis not present

## 2022-09-05 DIAGNOSIS — K921 Melena: Secondary | ICD-10-CM | POA: Diagnosis not present

## 2022-09-05 DIAGNOSIS — R197 Diarrhea, unspecified: Secondary | ICD-10-CM

## 2022-09-05 LAB — IBC + FERRITIN
Ferritin: 5.8 ng/mL — ABNORMAL LOW (ref 10.0–291.0)
Iron: 14 ug/dL — ABNORMAL LOW (ref 42–145)
Saturation Ratios: 2.9 % — ABNORMAL LOW (ref 20.0–50.0)
TIBC: 484.4 ug/dL — ABNORMAL HIGH (ref 250.0–450.0)
Transferrin: 346 mg/dL (ref 212.0–360.0)

## 2022-09-05 LAB — CBC
HCT: 30.8 % — ABNORMAL LOW (ref 36.0–46.0)
Hemoglobin: 9.5 g/dL — ABNORMAL LOW (ref 12.0–15.0)
MCHC: 30.9 g/dL (ref 30.0–36.0)
MCV: 67.6 fl — ABNORMAL LOW (ref 78.0–100.0)
Platelets: 475 10*3/uL — ABNORMAL HIGH (ref 150.0–400.0)
RBC: 4.56 Mil/uL (ref 3.87–5.11)
RDW: 21.4 % — ABNORMAL HIGH (ref 11.5–15.5)
WBC: 6.2 10*3/uL (ref 4.0–10.5)

## 2022-09-05 LAB — BASIC METABOLIC PANEL
BUN: 7 mg/dL (ref 6–23)
CO2: 21 mEq/L (ref 19–32)
Calcium: 8.7 mg/dL (ref 8.4–10.5)
Chloride: 105 mEq/L (ref 96–112)
Creatinine, Ser: 0.81 mg/dL (ref 0.40–1.20)
GFR: 73.78 mL/min (ref >60.00)
Glucose, Bld: 78 mg/dL (ref 70–99)
Potassium: 3.5 mEq/L (ref 3.5–5.1)
Sodium: 137 mEq/L (ref 135–145)

## 2022-09-05 MED ORDER — CLINDAMYCIN PHOSPHATE 1 % EX GEL
Freq: Two times a day (BID) | CUTANEOUS | 0 refills | Status: DC
Start: 2022-09-05 — End: 2022-11-15

## 2022-09-05 MED ORDER — PANTOPRAZOLE SODIUM 40 MG PO TBEC
40.0000 mg | DELAYED_RELEASE_TABLET | Freq: Two times a day (BID) | ORAL | 1 refills | Status: DC
Start: 2022-09-05 — End: 2022-11-15

## 2022-09-05 NOTE — Patient Instructions (Signed)
  Robaxin as needed for pain, tylenol as well . Can use lidocaine patches over the counter 4%    Regards,   Amani Marseille FNP-C

## 2022-09-05 NOTE — Progress Notes (Signed)
Established Patient Office Visit  Subjective:      CC:  Chief Complaint  Patient presents with   Cyst    Vaginal area   Back Pain   Diarrhea    HPI: Sydney Evans is a 72 y.o. female presenting on 09/05/2022 for Cyst (Vaginal area), Back Pain, and Diarrhea . Ongoing back pain, radiates from the left lower side to the left lateral side. Improved since she has been in the hospital. Ct was unremarkable. Was comfortable after fentanyl injection as well as given normal for two days upon discharge.   Ongoing chronic nausea, since decreasing the omeprazole wants to go back to 40 mg twice daily.   Frustrated because drained, increasing fatigue, and describe everything 'as extra effort to do even one thing'   New complaints: One boil on her lower vaginal area has been there about one week. Doesn't hurt. The boil is raised.        Social history:  Relevant past medical, surgical, family and social history reviewed and updated as indicated. Interim medical history since our last visit reviewed.  Allergies and medications reviewed and updated.  DATA REVIEWED: CHART IN EPIC     ROS: Negative unless specifically indicated above in HPI.    Current Outpatient Medications:    clindamycin (CLINDAGEL) 1 % gel, Apply topically 2 (two) times daily., Disp: 30 g, Rfl: 0   pantoprazole (PROTONIX) 40 MG tablet, Take 1 tablet (40 mg total) by mouth 2 (two) times daily., Disp: 180 tablet, Rfl: 1   acetaminophen (TYLENOL) 500 MG tablet, Take 500 mg by mouth every 6 (six) hours as needed., Disp: , Rfl:    albuterol (VENTOLIN HFA) 108 (90 Base) MCG/ACT inhaler, Inhale 2 puffs into the lungs every 4 (four) hours as needed for shortness of breath., Disp: 1 each, Rfl: 0   amLODipine (NORVASC) 5 MG tablet, Take 1 tablet (5 mg total) by mouth daily., Disp: 90 tablet, Rfl: 3   Carboxymethylcellulose Sodium (LUBRICANT EYE DROPS OP), Place 1 drop into both eyes daily as needed (Dry eye). Systane,  Disp: , Rfl:    citalopram (CELEXA) 40 MG tablet, Take 1 tablet (40 mg total) by mouth daily., Disp: 90 tablet, Rfl: 0   dicyclomine (BENTYL) 10 MG capsule, Take 1 capsule (10 mg total) by mouth 4 (four) times daily -  before meals and at bedtime for 5 days., Disp: 20 capsule, Rfl: 0   diphenhydrAMINE (BENADRYL) 25 MG tablet, Take 25 mg by mouth every 6 (six) hours as needed for itching., Disp: , Rfl:    diphenoxylate-atropine (LOMOTIL) 2.5-0.025 MG tablet, Take 1-2 tablets by mouth 4 (four) times daily as needed for diarrhea or loose stools (max 8 tabs per day)., Disp: 30 tablet, Rfl: 0   fluticasone (FLONASE) 50 MCG/ACT nasal spray, Place 2 sprays into both nostrils daily as needed for allergies or rhinitis., Disp: 16 g, Rfl: 12   gabapentin (NEURONTIN) 300 MG capsule, Take 1 capsule (300 mg total) by mouth 4 (four) times daily., Disp: 360 capsule, Rfl: 3   ibuprofen (ADVIL) 800 MG tablet, Take 800 mg by mouth every 8 (eight) hours as needed., Disp: , Rfl:    lamoTRIgine (LAMICTAL) 200 MG tablet, Take 1 tablet (200 mg total) by mouth daily., Disp: 90 tablet, Rfl: 0   latanoprost (XALATAN) 0.005 % ophthalmic solution, SMARTSIG:In Eye(s), Disp: , Rfl:    methocarbamol (ROBAXIN) 500 MG tablet, Take 500 mg by mouth 3 (three) times daily., Disp: , Rfl:  Probiotic Product (FORTIFY PROBIOTIC WOMENS PO), Take 1 Capful by mouth daily., Disp: , Rfl:    promethazine (PHENERGAN) 12.5 MG tablet, Take 1 tablet (12.5 mg total) by mouth every 8 (eight) hours as needed for nausea or vomiting., Disp: 15 tablet, Rfl: 0   propranolol (INDERAL) 20 MG tablet, Take 20 mg by mouth daily as needed., Disp: , Rfl:    valACYclovir (VALTREX) 500 MG tablet, Take 1 tablet (500 mg total) by mouth daily., Disp: 90 tablet, Rfl: 1      Objective:    BP 136/76   Pulse 82   Temp 97.9 F (36.6 C) (Temporal)   Ht 5\' 2"  (1.575 m)   Wt 164 lb (74.4 kg)   SpO2 98%   BMI 30.00 kg/m   Wt Readings from Last 3 Encounters:   09/05/22 164 lb (74.4 kg)  08/22/22 164 lb (74.4 kg)  08/03/22 167 lb 8.8 oz (76 kg)    Physical Exam Constitutional:      General: She is not in acute distress.    Appearance: Normal appearance. She is normal weight. She is not ill-appearing, toxic-appearing or diaphoretic.  HENT:     Head: Normocephalic.  Cardiovascular:     Rate and Rhythm: Normal rate.  Pulmonary:     Effort: Pulmonary effort is normal.  Musculoskeletal:        General: Normal range of motion.  Skin:    Comments: Small area of cyst like cervice with resolved abscessed suspected. No erythema, nontender to palpation. This is directly superior to the right lower labial area. Also another at the right anterior thigh appearing the same.   Neurological:     General: No focal deficit present.     Mental Status: She is alert and oriented to person, place, and time. Mental status is at baseline.  Psychiatric:        Mood and Affect: Mood normal.        Behavior: Behavior normal.        Thought Content: Thought content normal.        Judgment: Judgment normal.            Assessment & Plan:  Gastroesophageal reflux disease without esophagitis Assessment & Plan: Stop omeprazole start pantoprazole 40 mg twice daily  Try to decrease and or avoid spicy foods, fried fatty foods, and also caffeine and chocolate as these can increase heartburn symptoms.  F/u with Gi as scheduled for consult.   Orders: -     Pantoprazole Sodium; Take 1 tablet (40 mg total) by mouth 2 (two) times daily.  Dispense: 180 tablet; Refill: 1  Skin lesion -     Ambulatory referral to Dermatology  Gastrointestinal food sensitivity -     Celiac Disease Panel -     Alpha-Gal Panel  Nausea and vomiting, unspecified vomiting type -     Celiac Disease Panel -     Alpha-Gal Panel  Diarrhea, unspecified type -     Celiac Disease Panel -     Alpha-Gal Panel  Black stool Assessment & Plan: R/o GI bleed with fobt  Also ordering  calproetectin to r/o ibd  Worsening fatigue concern for cbc ibc ferritin    Orders: -     CBC -     IBC + Ferritin -     Fecal occult blood, imunochemical; Future -     Calprotectin, Fecal  Iron deficiency anemia due to chronic blood loss -     CBC -  IBC + Ferritin -     Calprotectin, Fecal  Low blood potassium -     Basic metabolic panel  Recurrent boils Assessment & Plan: Clindamycin gel rx prescribed for prn  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  Dispense: 30 g; Refill: 0  Hypokalemia Assessment & Plan: repeating bmp pending results.       Return in about 1 month (around 10/06/2022) for f/u new complaints, fatigue.  Mort Sawyers, MSN, APRN, FNP-C [facility]

## 2022-09-05 NOTE — Addendum Note (Signed)
Addended by: Eual Fines on: 09/05/2022 01:21 PM   Modules accepted: Orders

## 2022-09-05 NOTE — Telephone Encounter (Signed)
Duringf her OV, pt forgot to mention leg shaking/tremors. States whenever it gets started, its hard to control. Was not sure if there was something Tabitha could do about it.

## 2022-09-05 NOTE — Telephone Encounter (Signed)
Will respond 5/3.  Also personal reminder to send in medication list with description of what they are needed for.

## 2022-09-05 NOTE — Addendum Note (Signed)
Addended by: Gabriel Rainwater on: 09/05/2022 01:22 PM   Modules accepted: Orders

## 2022-09-05 NOTE — Assessment & Plan Note (Signed)
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

## 2022-09-05 NOTE — Assessment & Plan Note (Addendum)
R/o GI bleed with fobt  Also ordering calproetectin to r/o ibd  Worsening fatigue concern for cbc ibc ferritin

## 2022-09-05 NOTE — Assessment & Plan Note (Signed)
repeating bmp pending results.

## 2022-09-05 NOTE — Assessment & Plan Note (Signed)
Stop omeprazole start pantoprazole 40 mg twice daily  Try to decrease and or avoid spicy foods, fried fatty foods, and also caffeine and chocolate as these can increase heartburn symptoms.  F/u with Gi as scheduled for consult.

## 2022-09-06 ENCOUNTER — Other Ambulatory Visit: Payer: Self-pay | Admitting: Family

## 2022-09-06 DIAGNOSIS — D508 Other iron deficiency anemias: Secondary | ICD-10-CM

## 2022-09-06 LAB — ALPHA-GAL PANEL: CLASS: 0

## 2022-09-06 NOTE — Telephone Encounter (Signed)
This is hard to assess due to just a verbal. Is she shaking as if she is having what is called rigors? Is her leg having spascity? Does she drink enough water? Is she have nerve pains with this? Is it just the left side?   Curious what medications exactly she has stopped, and how abruptly? Can we get info on that? Please also advise I will have a detailed note on her current medications with what she is taking them for by next week.

## 2022-09-06 NOTE — Telephone Encounter (Addendum)
I called and spoke with the patient. She states her whole body shakes and the PT was there today and noticed. For the past few months, this comes and goes. She said it doesn't feel like cold chills, but just weird shaking all the way down and can be painful at times. Pt says she drinks about 15 (16oz) bottles of water per day because she have extreme thirst. She is not sure if its her nerves, but can feel the tremors/shaking. I did confirm that she was taking: Ariprazole, latanoprost, methocarbamol, oxycodone, propranolol, and valacyclovir. She was taking one day and the next day she decided to no longer take anymore. She did not wing herself off any medications. These medications were stopped a week and half ago.    Lab results were also given.

## 2022-09-06 NOTE — Progress Notes (Signed)
Iron and ferritin very low. I remember her saying she can not tolerate iron. I will send her to hematology she may need an iron infusion. Electrolytes have improved, back to normal. Still pending additional tests.

## 2022-09-09 ENCOUNTER — Other Ambulatory Visit (INDEPENDENT_AMBULATORY_CARE_PROVIDER_SITE_OTHER): Payer: 59 | Admitting: *Deleted

## 2022-09-09 ENCOUNTER — Inpatient Hospital Stay: Payer: 59

## 2022-09-09 ENCOUNTER — Inpatient Hospital Stay: Payer: 59 | Attending: Internal Medicine | Admitting: Internal Medicine

## 2022-09-09 VITALS — BP 113/53 | HR 61 | Temp 98.4°F | Wt 158.0 lb

## 2022-09-09 DIAGNOSIS — D500 Iron deficiency anemia secondary to blood loss (chronic): Secondary | ICD-10-CM

## 2022-09-09 DIAGNOSIS — I10 Essential (primary) hypertension: Secondary | ICD-10-CM | POA: Insufficient documentation

## 2022-09-09 DIAGNOSIS — K921 Melena: Secondary | ICD-10-CM | POA: Diagnosis not present

## 2022-09-09 DIAGNOSIS — D509 Iron deficiency anemia, unspecified: Secondary | ICD-10-CM | POA: Insufficient documentation

## 2022-09-09 LAB — ALPHA-GAL PANEL
Allergen, Pork, f26: <0.1 kU/L
Class: 0

## 2022-09-09 LAB — CELIAC DISEASE PANEL
Gliadin IgA: 2.7 U/mL
Gliadin IgG: <1 U/mL

## 2022-09-09 NOTE — Telephone Encounter (Signed)
Advise pt these are not medications that she can just discontinue altogether. She is likely experiencing withdrawals, especially stopping abruptly the ariprazole and propanolol both of which I would like for her to resume as I feel they are necessary. She can go without valacyclovir, and use only when needed (when she gets a painful sore in her vaginal area) and methocarbamol is a muscle relaxer that can be as needed. She does need ariprazole as a mood stabilizer and propanolol for heart rate.if lantoprost is an eye drop this is also very important.

## 2022-09-09 NOTE — Progress Notes (Signed)
Pt is also having severe stomach pain rates at an 8 today.

## 2022-09-09 NOTE — Telephone Encounter (Signed)
Left message to return call to our office.  

## 2022-09-09 NOTE — Progress Notes (Signed)
[facility]  [phone] [phone]  ID: Sydney Evans OB: 11-19-1950  MR#: [id number]  AOZ#:[id number]  Patient Care Team: Mort Sawyers, FNP as PCP - General (Family Medicine) Lucretia Roers Dory Larsen, MD as Referring Physician (Vascular Surgery) Wenda Overland, LCSW as Social Worker  REFERRING PROVIDER: Mort Sawyers, FNP  REASON FOR REFERRAL: Iron deficiency anemia  HPI: Sydney Evans is a 72 y.o. female with past medical history of iron deficiency anemia, Barrett's esophagus GERD, hypertension, migraine, right BKA for PVD in 2023 referred to hematology for management of iron deficiency anemia.  Patient reports history of diarrhea, nausea and vomiting for the past 3 months.  Follows with Dr. Mia Creek of GI.  Has not been able to eat. Endoscopic with Dr. Mia Creek on 12/14/2019 showed 11 mm submucosal nodule in the first portion of duodenum and a papillary lesion 3 mm in the duodenal bulb.  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  Colonoscopy from 2019 was normal.  CT abdomen pelvis (08/22/2022) done for abdominal pain- no acute abnormality  Longstanding history of iron deficiency anemia-previously seen by Dr. Cathie Hoops and received IV Venofer.  Last dose in February 2023.  Labs (09/05/2022) hemoglobin 9.5, MCV 67.6, iron 14, saturation 2.9% and ferritin 5.8.  Celiac panel negative.  Alpha gal panel pending.  FIT stool test and fecal calprotectin level pending.  Recommended repeat colonoscopy and endoscopy by Dr. Mia Creek.  She reports fatigue, very low energy level and craving for water.   REVIEW OF SYSTEMS:   ROS  As per HPI. Otherwise, a complete review of systems is negative.  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   Carotid arterial disease (HCC)    Contracture of joint of upper arm    Critical limb ischemia of right lower extremity (HCC) 06/21/2021   Depression    Dislocation of right shoulder joint 10/06/2019   Disorder of bone and articular cartilage 09/29/2014   Elevated lipids    GERD (gastroesophageal reflux disease)    Hypertension    Migraine    Osteoporosis    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   Poor balance    Rotator cuff tendinitis, left 05/20/2016   Sinus congestion    Status post total shoulder arthroplasty, 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   Status post total shoulder replacement, 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   Stroke (HCC)    x 2   TIA (transient ischemic attack)    Traumatic complete tear of right rotator cuff 10/08/2019   Vertigo     PAST SURGICAL HISTORY: Past Surgical History:  Procedure Laterality Date   ABDOMINAL HYSTERECTOMY     pt states might still have ovaries   bka right     BREAST BIOPSY Left    CARPAL TUNNEL RELEASE Bilateral    CATARACT EXTRACTION W/ INTRAOCULAR LENS  IMPLANT, BILATERAL Bilateral    CHOLECYSTECTOMY     COLONOSCOPY WITH PROPOFOL N/A 04/09/2018   Procedure: COLONOSCOPY WITH PROPOFOL;  Surgeon: Wyline Mood, MD;  Location: [facility] ENDOSCOPY;  Service: Gastroenterology;  Laterality: N/A;   crystal cyst removed on left foot     ESOPHAGOGASTRODUODENOSCOPY (EGD) WITH PROPOFOL N/A 04/09/2018  Procedure: ESOPHAGOGASTRODUODENOSCOPY (EGD) WITH PROPOFOL;  Surgeon: Wyline Mood, MD;  Location: [facility] ENDOSCOPY;  Service: Gastroenterology;  Laterality: N/A;   ESOPHAGOGASTRODUODENOSCOPY (EGD) WITH PROPOFOL N/A 12/14/2019   Procedure: ESOPHAGOGASTRODUODENOSCOPY (EGD) WITH PROPOFOL;  Surgeon: Regis Bill, MD;  Location: [facility] ENDOSCOPY;  Service: Endoscopy;  Laterality: N/A;   EYE SURGERY     femoral fx     FOOT SURGERY     GIVENS CAPSULE STUDY N/A 06/16/2018   Procedure: GIVENS CAPSULE STUDY;  Surgeon: Wyline Mood, MD;  Location: [facility]  ENDOSCOPY;  Service: Gastroenterology;  Laterality: N/A;   JOINT REPLACEMENT     KNEE SURGERY     KYPHOPLASTY     LOWER EXTREMITY ANGIOGRAPHY Right 06/21/2021   Procedure: Lower Extremity Angiography;  Surgeon: Renford Dills, MD;  Location: [facility] INVASIVE CV LAB;  Service: Cardiovascular;  Laterality: Right;   ORIF PERIPROSTHETIC FRACTURE Right 10/07/2019   Procedure: OPEN REDUCTION INTERNAL FIXATION (ORIF) PERIPROSTHETIC FRACTURE;  Surgeon: Christena Flake, MD;  Location: [facility] ORS;  Service: Orthopedics;  Laterality: Right;   TONSILLECTOMY     TOTAL KNEE ARTHROPLASTY Bilateral    TOTAL SHOULDER ARTHROPLASTY Left 08/01/2016   Procedure: TOTAL SHOULDER ARTHROPLASTY;  Surgeon: Christena Flake, MD;  Location: [facility] ORS;  Service: Orthopedics;  Laterality: Left;   TOTAL SHOULDER ARTHROPLASTY Right 09/30/2019   Procedure: TOTAL SHOULDER ARTHROPLASTY;  Surgeon: Christena Flake, MD;  Location: [facility] ORS;  Service: Orthopedics;  Laterality: Right;    FAMILY HISTORY: Family History  Problem Relation Age of Onset   Other Mother        ?lupus    Cancer Mother    High Cholesterol Mother    Alcohol abuse Father    CAD Father        CABG   High Cholesterol Father    Arthritis Father    Heart murmur Sister    Bradycardia Sister    Heart murmur Brother    High blood pressure Other        "for everybody"   High Cholesterol Other        "for everybody"    HEALTH MAINTENANCE: Social History   Tobacco Use   Smoking status: Every Day    Packs/day: .5    Types: Cigarettes    Last attempt to quit: 08/04/2016    Years since quitting: 6.1   Smokeless tobacco: Never   Tobacco comments:    1 pack lasts about 2.5 days  Vaping Use   Vaping Use: Never used  Substance Use Topics   Alcohol use: No   Drug use: No     Allergies  Allergen Reactions   Hydroxyzine Hives and Rash   Tramadol Hives   Zofran [Ondansetron]     Broke mouth out   Dilaudid [Hydromorphone] Itching   Ketorolac Rash      May take with benadryl   Toradol [Ketorolac Tromethamine] Rash    May take with benadryl    Current Outpatient Medications  Medication Sig Dispense Refill   acetaminophen (TYLENOL) 500 MG tablet Take 500 mg by mouth every 6 (six) hours as needed.     albuterol (VENTOLIN HFA) 108 (90 Base) MCG/ACT inhaler Inhale 2 puffs into the lungs every 4 (four) hours as needed for shortness of breath. 1 each 0   citalopram (CELEXA) 40 MG tablet Take 1 tablet (40 mg total) by mouth daily. 90 tablet 0   gabapentin (NEURONTIN) 300 MG capsule Take 1 capsule (300 mg total) by mouth 4 (four)  times daily. 360 capsule 3   lamoTRIgine (LAMICTAL) 200 MG tablet Take 1 tablet (200 mg total) by mouth daily. 90 tablet 0   pantoprazole (PROTONIX) 40 MG tablet Take 1 tablet (40 mg total) by mouth 2 (two) times daily. 180 tablet 1   Probiotic Product (FORTIFY PROBIOTIC WOMENS PO) Take 1 Capful by mouth daily.     amLODipine (NORVASC) 5 MG tablet Take 1 tablet (5 mg total) by mouth daily. (Patient not taking: Reported on 09/09/2022) 90 tablet 3   Carboxymethylcellulose Sodium (LUBRICANT EYE DROPS OP) Place 1 drop into both eyes daily as needed (Dry eye). Systane (Patient not taking: Reported on 09/09/2022)     clindamycin (CLINDAGEL) 1 % gel Apply topically 2 (two) times daily. (Patient not taking: Reported on 09/09/2022) 30 g 0   diphenhydrAMINE (BENADRYL) 25 MG tablet Take 25 mg by mouth every 6 (six) hours as needed for itching. (Patient not taking: Reported on 09/09/2022)     diphenoxylate-atropine (LOMOTIL) 2.5-0.025 MG tablet Take 1-2 tablets by mouth 4 (four) times daily as needed for diarrhea or loose stools (max 8 tabs per day). (Patient not taking: Reported on 09/09/2022) 30 tablet 0   No current facility-administered medications for this visit.    OBJECTIVE: Vitals:   09/09/22 1418  BP: (!) 113/53  Pulse: 61  Temp: 98.4 F (36.9 C)  SpO2: 98%     Body mass index is 28.9 kg/m.      General: Well-developed,  well-nourished, no acute distress. Eyes: Pink conjunctiva, anicteric sclera. HEENT: Normocephalic, moist mucous membranes, clear oropharnyx. Lungs: Clear to auscultation bilaterally. Heart: Regular rate and rhythm. No rubs, murmurs, or gallops. Abdomen: Soft, nontender, nondistended. No organomegaly noted, normoactive bowel sounds. Musculoskeletal: No edema, cyanosis, or clubbing. Neuro: Alert, answering all questions appropriately. Cranial nerves grossly intact. Skin: No rashes or petechiae noted. Psych: Normal affect. Lymphatics: No cervical, calvicular, axillary or inguinal LAD.   LAB RESULTS:  Lab Results  Component Value Date   NA 137 09/05/2022   K 3.5 09/05/2022   CL 105 09/05/2022   CO2 21 09/05/2022   GLUCOSE 78 09/05/2022   BUN 7 09/05/2022   CREATININE 0.81 09/05/2022   CALCIUM 8.7 09/05/2022   PROT 6.3 (L) 08/22/2022   ALBUMIN 3.1 (L) 08/22/2022   AST 21 08/22/2022   ALT 11 08/22/2022   ALKPHOS 59 08/22/2022   BILITOT 0.8 08/22/2022   GFRNONAA >60 08/22/2022   GFRAA 86 03/17/2020    Lab Results  Component Value Date   WBC 6.2 09/05/2022   NEUTROABS 4.5 08/22/2022   HGB 9.5 (L) 09/05/2022   HCT 30.8 (L) 09/05/2022   MCV 67.6 Repeated and verified X2. (L) 09/05/2022   PLT 475.0 (H) 09/05/2022    Lab Results  Component Value Date   TIBC 484.4 (H) 09/05/2022   TIBC 404.6 08/01/2021   TIBC 490 (H) 06/21/2021   FERRITIN 5.8 (L) 09/05/2022   FERRITIN 24.2 08/01/2021   FERRITIN 6 (L) 06/21/2021   IRONPCTSAT 2.9 (L) 09/05/2022   IRONPCTSAT 8.9 (L) 08/01/2021   IRONPCTSAT 5 (L) 06/21/2021     STUDIES: CT ABDOMEN PELVIS W CONTRAST  Result Date: 08/22/2022 CLINICAL DATA:  Left lower quadrant abdominal pain. EXAM: CT ABDOMEN AND PELVIS WITH CONTRAST TECHNIQUE: Multidetector CT imaging of the abdomen and pelvis was performed using the standard protocol following bolus administration of intravenous contrast. RADIATION DOSE REDUCTION: This exam was performed  according to the departmental dose-optimization program which includes automated exposure control, adjustment of  the mA and/or kV according to patient size and/or use of iterative reconstruction technique. CONTRAST:  OMNIPAQUE IOHEXOL 300 MG/ML  SOLN COMPARISON:  CT 07/16/2022 FINDINGS: Lower chest: No pleural effusion or basilar airspace disease. There is minimal linear hyperdense material within the right lower lobe that is likely related to previous kyphoplasty. Calcified left lower lobe granuloma. Hepatobiliary: Post cholecystectomy. There is chronic intrahepatic biliary ductal dilatation. Stable common bile duct measuring 9 mm. No focal hepatic lesion. Pancreas: No ductal dilatation or inflammation. Parenchymal atrophy. Spleen: Normal in size without focal abnormality. Adrenals/Urinary Tract: Normal adrenal glands. No hydronephrosis. No renal calculi or acute renal findings. No suspicious renal lesion. Partially distended urinary bladder, normal for degree of distension. Stomach/Bowel: No bowel obstruction or inflammation. Normal appendix. Descending and sigmoid colonic diverticulosis without diverticulitis. Vascular/Lymphatic: Aortic and branch atherosclerosis. No aortic aneurysm. No acute vascular findings. No abdominopelvic adenopathy. Reproductive: Status post hysterectomy. No adnexal masses. Other: No free air, free fluid, or intra-abdominal fluid collection. Musculoskeletal: Lower thoracic kyphoplasty. Similar lumbar degenerative change. There are no acute or suspicious osseous abnormalities. IMPRESSION: 1. No acute abnormality or explanation for abdominal pain. 2. Colonic diverticulosis without diverticulitis. Aortic Atherosclerosis (ICD10-I70.0). Electronically Signed   By: Narda Rutherford M.D.   On: 08/22/2022 23:05    ASSESSMENT AND PLAN:   Sydney Evans is a 72 y.o. female with pmh of iron deficiency anemia, Barrett's esophagus GERD, hypertension, migraine, right BKA for PVD in 2023  referred to hematology for management of iron deficiency anemia.  # Iron deficiency anemia -Progressive.  Could not tolerate oral iron due to diarrhea.  Treated with IV Venofer previously.  Last infusion 06/2021.  Recent labs from 09/05/2022 showed hemoglobin 9.6 and ferritin of 5.  Patient reports she has multiple iron infusions in the past and does not respond well to it.  She was hesitant to try it again.  She is symptomatic with fatigue.  After long discussion, patient agreeable to do 1 more trial of IV Venofer 200 mg weekly x 5 doses.  -She is having multiple GI symptoms such as diarrhea, nausea, dry heaving, vomiting for past 3 months.  Unable to tolerate food.  Is following with Dr. Mia Creek.  Was recommended repeat colonoscopy and endoscopy. Endoscopic with Dr. Mia Creek on 12/14/2019 showed 11 mm submucosal nodule in the first portion of duodenum and a papillary lesion 3 mm in the duodenal bulb.  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  Colonoscopy from 2019 was normal.  Schedule for IV Venofer weekly x 5 RTC in 3 months for MD visit, labs, possible Venofer  Patient expressed understanding and was in agreement with this plan. She also understands that She can call clinic at any time with any questions, concerns, or complaints.   I spent a total of 45 minutes reviewing chart data, face-to-face evaluation with the patient, counseling and coordination of care as detailed above.  Michaelyn Barter, MD   09/09/2022 2:25 PM

## 2022-09-09 NOTE — Progress Notes (Signed)
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 She has lack of energy since her amputation. Pt. Is anemic, and she is worried about her labs being irregular.

## 2022-09-10 ENCOUNTER — Telehealth: Payer: Self-pay

## 2022-09-10 ENCOUNTER — Encounter: Payer: Self-pay | Admitting: Internal Medicine

## 2022-09-10 ENCOUNTER — Telehealth: Payer: Self-pay | Admitting: Family

## 2022-09-10 DIAGNOSIS — L0291 Cutaneous abscess, unspecified: Secondary | ICD-10-CM

## 2022-09-10 DIAGNOSIS — I4729 Other ventricular tachycardia: Secondary | ICD-10-CM

## 2022-09-10 LAB — FECAL OCCULT BLOOD, IMMUNOCHEMICAL: Fecal Occult Bld: POSITIVE — AB

## 2022-09-10 NOTE — Progress Notes (Signed)
Addressed via phone note.

## 2022-09-10 NOTE — Telephone Encounter (Signed)
Prescription Request  09/10/2022  LOV: 09/05/2022  What is the name of the medication or equipment? omeprazole (PRILOSEC) 20 MG capsule   Have you contacted your pharmacy to request a refill? No   Which pharmacy would you like this sent to?  [facility] - [place], [place] - [address] [place] [address] Phone: [phone] Fax: [phone]   Patient stated that the clindamycin (CLINDAGEL) 1 % gel isn't working and was wondering if an antibiotic pill could be sent in for her boils.   Patient notified that their request is being sent to the clinical staff for review and that they should receive a response within 2 business days.   Please advise at [facility] [phone]

## 2022-09-10 NOTE — Telephone Encounter (Signed)
Advised patient Sydney Evans's recommendations of restarting her medications.

## 2022-09-10 NOTE — Telephone Encounter (Signed)
Patient is aware of results and will call her GI tomorrow to schedule an evaluation. She also mentioned that the Clindamycin gel is not working for her and wanted to see if there is something in pill form that she can get instead. Patient will also like a refill for propranolol and lomotil as she is out of these medications.

## 2022-09-10 NOTE — Telephone Encounter (Signed)
See results note. 

## 2022-09-10 NOTE — Telephone Encounter (Signed)
Please see lab results.

## 2022-09-10 NOTE — Telephone Encounter (Signed)
Positive blood in stool on recent testing.  Considering she also has worsening anemia needs to be evaluated ASAP for possible GI bleed, could also contribute to fatigue with this.   Does she have a GI doctor?  I see she has seen one in the past but don't know how long ago. If she does have a GI she needs to call right away to get an appt for evaluation for blood in stool.   If any worsening fatigue, noticing black/blood stool, fainting spells or feeling constantly lightheaded or abdominal pain pt to go to ER.  I do also see her hematology appt is 5/14. Keep this appt as likely will need iron replacement.

## 2022-09-10 NOTE — Telephone Encounter (Signed)
Jacki Cones with [facility] called report for + ifob. Sending note to Hayden Pedro FNP and [place] pool.

## 2022-09-11 ENCOUNTER — Telehealth: Payer: Self-pay

## 2022-09-11 MED ORDER — PROPRANOLOL HCL 20 MG PO TABS: 20.0000 mg | ORAL_TABLET | Freq: Every day | ORAL | 0 refills | Status: DC | PRN

## 2022-09-11 NOTE — Telephone Encounter (Signed)
Lomotil is filled by your GI not me. Please call them to get this refilled. I sent in refill for your propanolol  Also when I saw you your 'boil' was not inflamed. Is it bigger now? Hurting? Clindamycin gel is more for a preventative for recurrent flares.   Any call yet for dermatology appointment?

## 2022-09-11 NOTE — Telephone Encounter (Addendum)
Pt called it and stated that she has 3 boils and needs an antibiotic. Pt has another boil come up in between the breast and 1 "downstairs."

## 2022-09-12 MED ORDER — CEPHALEXIN 500 MG PO CAPS
500.0000 mg | ORAL_CAPSULE | Freq: Three times a day (TID) | ORAL | 0 refills | Status: AC
Start: 2022-09-12 — End: 2022-09-22

## 2022-09-12 NOTE — Addendum Note (Signed)
Addended by: Mort Sawyers on: 09/12/2022 07:58 AM   Modules accepted: Orders

## 2022-09-14 LAB — CALPROTECTIN, FECAL: Calprotectin, Fecal: 41 ug/g (ref 0–120)

## 2022-09-15 LAB — ALPHA-GAL PANEL
Allergen, Mutton, f88: <0.1 kU/L
Beef: <0.1 kU/L
CLASS: 0
GALACTOSE-ALPHA-1,3-GALACTOSE IGE*: <0.1 kU/L (ref <0.10)

## 2022-09-15 LAB — INTERPRETATION:

## 2022-09-15 LAB — CELIAC DISEASE PANEL
(tTG) Ab, IgA: <1 U/mL
(tTG) Ab, IgG: <1 U/mL
Immunoglobulin A: 268 mg/dL (ref 70–320)

## 2022-09-16 MED FILL — Iron Sucrose Inj 20 MG/ML (Fe Equiv): INTRAVENOUS | Qty: 10 | Status: AC

## 2022-09-17 ENCOUNTER — Inpatient Hospital Stay: Payer: 59

## 2022-09-17 VITALS — BP 150/70 | HR 54 | Temp 98.6°F | Resp 16

## 2022-09-17 DIAGNOSIS — D509 Iron deficiency anemia, unspecified: Secondary | ICD-10-CM | POA: Diagnosis not present

## 2022-09-17 MED ORDER — SODIUM CHLORIDE 0.9 % IV SOLN
Freq: Once | INTRAVENOUS | Status: AC
  Filled 2022-09-17: qty 250

## 2022-09-17 MED ORDER — SODIUM CHLORIDE 0.9 % IV SOLN
200.0000 mg | Freq: Once | INTRAVENOUS | Status: AC
  Administered 2022-09-17: 200 mg via INTRAVENOUS
  Filled 2022-09-17: qty 200

## 2022-09-17 NOTE — Patient Instructions (Signed)

## 2022-09-23 ENCOUNTER — Telehealth: Payer: Self-pay | Admitting: Family
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
## 2022-09-23 NOTE — Telephone Encounter (Signed)
Thank you! This is helpful. I will include my team here.   Inetta Fermo- could you reach out if she is available to come tomorrow for IP, or place on waiting list for sooner appointment?  Thanks.

## 2022-09-23 NOTE — Telephone Encounter (Signed)
Good afternoon Dr. Vanetta Shawl,   Our mutual patient Sydney Evans has abruptly stopped all of her psychiatric medications.  I have advised her the problems with doing that, and that she needed to continue taking these medications. I then received a message from her home health nurse stating that she has stopped even the medication I was prescribing.   I do believe that her depression is worsening due to her concerns over her health condition and decline in IADLs , and I also do see that she missed her last appt with you. I wanted you to be aware. I did reach out and ask them to make afollow up appointment with you, but thought maybe you'd like to also have your office call to reach out to her.    Regards,   Mort Sawyers FNP-C

## 2022-09-23 NOTE — Telephone Encounter (Signed)
Dorothy Puffer, NP from [employer] contacted the office regarding this patient. Wanted her pcp to be aware that patient told caller she had stopped taking some of her medications including propanolol and atorvastatin. Patient told Dorothy Puffer that she had a rash and thought she had a reaction to a medication, but is taking so many she is unsure which one could have caused it. Dorothy Puffer states she educated patient on importance of taking all of her medications. If needed, contact number for Dorothy Puffer is [phone], may leave message if needed.

## 2022-09-24 ENCOUNTER — Inpatient Hospital Stay: Payer: 59

## 2022-09-24 ENCOUNTER — Encounter: Payer: Self-pay | Admitting: Psychiatry

## 2022-09-24 ENCOUNTER — Ambulatory Visit (INDEPENDENT_AMBULATORY_CARE_PROVIDER_SITE_OTHER): Payer: 59 | Admitting: Psychiatry

## 2022-09-24 VITALS — BP 138/58 | HR 60 | Temp 97.9°F | Ht 62.0 in | Wt 158.0 lb

## 2022-09-24 DIAGNOSIS — F4310 Post-traumatic stress disorder, unspecified: Secondary | ICD-10-CM

## 2022-09-24 DIAGNOSIS — F319 Bipolar disorder, unspecified: Secondary | ICD-10-CM

## 2022-09-25 ENCOUNTER — Inpatient Hospital Stay: Payer: 59

## 2022-09-25 VITALS — BP 144/61 | HR 56 | Resp 16

## 2022-09-25 DIAGNOSIS — D509 Iron deficiency anemia, unspecified: Secondary | ICD-10-CM | POA: Diagnosis not present

## 2022-09-25 MED ORDER — SODIUM CHLORIDE 0.9 % IV SOLN
200.0000 mg | Freq: Once | INTRAVENOUS | Status: AC
  Administered 2022-09-25: 200 mg via INTRAVENOUS
  Filled 2022-09-25: qty 200

## 2022-09-25 MED ORDER — SODIUM CHLORIDE 0.9 % IV SOLN
Freq: Once | INTRAVENOUS | Status: AC
  Filled 2022-09-25: qty 250

## 2022-09-26 ENCOUNTER — Other Ambulatory Visit: Payer: Self-pay | Admitting: Family

## 2022-09-26 DIAGNOSIS — Z89511 Acquired absence of right leg below knee: Secondary | ICD-10-CM

## 2022-09-26 DIAGNOSIS — I10 Essential (primary) hypertension: Secondary | ICD-10-CM

## 2022-09-26 DIAGNOSIS — I70221 Atherosclerosis of native arteries of extremities with rest pain, right leg: Secondary | ICD-10-CM

## 2022-09-26 NOTE — Telephone Encounter (Addendum)
Per speaking with [employer]. The patients medication was transferred to mail order and she does not want that. She wants to pick up her medications from the local pharmacy. There are no refills available for them to fill at [facility]. I did not see where patient was taking atorvastatin. See pended medications.

## 2022-09-26 NOTE — Telephone Encounter (Signed)
Prescription Request  09/26/2022  LOV: 09/05/2022  What is the name of the medication or equipment? amLODipine (NORVASC) 5 MG tablet   gabapentin (NEURONTIN) 300 MG capsule   atorvastatin (LIPITOR) 10 MG tablet   valACYclovir (VALTREX) 500 MG tablet   Have you contacted your pharmacy to request a refill? Yes   Which pharmacy would you like this sent to?  [facility] - [place], [place] - [address] [place] [address] Phone: [phone] Fax: [phone]    Patient notified that their request is being sent to the clinical staff for review and that they should receive a response within 2 business days.   Please advise at Mobile [phone] (mobile)

## 2022-09-26 NOTE — Addendum Note (Signed)
Addended by: Gabriel Rainwater on: 09/26/2022 03:23 PM   Modules accepted: Orders

## 2022-10-01 ENCOUNTER — Inpatient Hospital Stay: Payer: 59

## 2022-10-01 VITALS — BP 116/71 | HR 57 | Temp 97.5°F | Resp 18

## 2022-10-01 DIAGNOSIS — D509 Iron deficiency anemia, unspecified: Secondary | ICD-10-CM

## 2022-10-01 MED ORDER — SODIUM CHLORIDE 0.9 % IV SOLN
200.0000 mg | Freq: Once | INTRAVENOUS | Status: AC
  Administered 2022-10-01: 200 mg via INTRAVENOUS
  Filled 2022-10-01: qty 200

## 2022-10-01 MED ORDER — SODIUM CHLORIDE 0.9 % IV SOLN
Freq: Once | INTRAVENOUS | Status: AC
  Filled 2022-10-01: qty 250

## 2022-10-01 MED ORDER — GABAPENTIN 300 MG PO CAPS
300.0000 mg | ORAL_CAPSULE | Freq: Four times a day (QID) | ORAL | 3 refills | Status: DC
Start: 2022-10-01 — End: 2022-11-15

## 2022-10-01 MED ORDER — VALACYCLOVIR HCL 500 MG PO TABS: 500.0000 mg | ORAL_TABLET | Freq: Every day | ORAL | 0 refills | Status: DC

## 2022-10-01 MED ORDER — AMLODIPINE BESYLATE 5 MG PO TABS
5.0000 mg | ORAL_TABLET | Freq: Every day | ORAL | 3 refills | Status: DC
Start: 2022-10-01 — End: 2022-11-15

## 2022-10-01 NOTE — Patient Instructions (Signed)

## 2022-10-01 NOTE — Telephone Encounter (Signed)
I have received a form from community care for enrollment into CAP )community alternatives program for disabled adults) Was this initiated by you? And what is current living situation? At home by self?

## 2022-10-02 NOTE — Telephone Encounter (Signed)
Called patient states that she did initiate request for services. She does live at home alone.

## 2022-10-03 NOTE — Telephone Encounter (Signed)
Please refax, in outbox. Not sure what there is for me to fill out.

## 2022-10-04 NOTE — Telephone Encounter (Signed)
Form faxed to number provided

## 2022-10-08 ENCOUNTER — Inpatient Hospital Stay: Payer: 59

## 2022-10-09 ENCOUNTER — Inpatient Hospital Stay: Payer: 59 | Attending: Internal Medicine

## 2022-10-09 VITALS — BP 125/54 | HR 52 | Temp 97.5°F | Resp 18

## 2022-10-09 DIAGNOSIS — D509 Iron deficiency anemia, unspecified: Secondary | ICD-10-CM | POA: Diagnosis present

## 2022-10-09 MED ORDER — SODIUM CHLORIDE 0.9 % IV SOLN
200.0000 mg | Freq: Once | INTRAVENOUS | Status: AC
  Administered 2022-10-09: 200 mg via INTRAVENOUS
  Filled 2022-10-09: qty 200

## 2022-10-09 MED ORDER — SODIUM CHLORIDE 0.9 % IV SOLN
Freq: Once | INTRAVENOUS | Status: AC
  Filled 2022-10-09: qty 250

## 2022-10-10 ENCOUNTER — Ambulatory Visit: Payer: 59 | Admitting: Psychiatry

## 2022-10-14 MED FILL — Iron Sucrose Inj 20 MG/ML (Fe Equiv): INTRAVENOUS | Qty: 10 | Status: AC

## 2022-10-15 ENCOUNTER — Encounter: Payer: Self-pay | Admitting: Emergency Medicine

## 2022-10-15 ENCOUNTER — Emergency Department
Admission: EM | Admit: 2022-10-15 | Discharge: 2022-10-15 | Disposition: A | Discharge status: Home / Self Care | Payer: 59 | Attending: Emergency Medicine | Admitting: Emergency Medicine

## 2022-10-15 ENCOUNTER — Emergency Department: Payer: 59

## 2022-10-15 ENCOUNTER — Other Ambulatory Visit: Payer: Self-pay

## 2022-10-15 ENCOUNTER — Inpatient Hospital Stay: Payer: 59

## 2022-10-15 DIAGNOSIS — Z720 Tobacco use: Secondary | ICD-10-CM | POA: Diagnosis not present

## 2022-10-15 DIAGNOSIS — N12 Tubulo-interstitial nephritis, not specified as acute or chronic: Secondary | ICD-10-CM | POA: Diagnosis not present

## 2022-10-15 DIAGNOSIS — I10 Essential (primary) hypertension: Secondary | ICD-10-CM | POA: Diagnosis not present

## 2022-10-15 DIAGNOSIS — R1031 Right lower quadrant pain: Secondary | ICD-10-CM | POA: Diagnosis present

## 2022-10-15 LAB — COMPREHENSIVE METABOLIC PANEL
ALT: 11 U/L (ref 0–44)
AST: 19 U/L (ref 15–41)
Albumin: 3.3 g/dL — ABNORMAL LOW (ref 3.5–5.0)
Alkaline Phosphatase: 55 U/L (ref 38–126)
Anion gap: 11 (ref 5–15)
BUN: 13 mg/dL (ref 8–23)
CO2: 17 mmol/L — ABNORMAL LOW (ref 22–32)
Calcium: 8.7 mg/dL — ABNORMAL LOW (ref 8.9–10.3)
Chloride: 104 mmol/L (ref 98–111)
Creatinine, Ser: 0.76 mg/dL (ref 0.44–1.00)
GFR, Estimated: >60 mL/min (ref >60)
Glucose, Bld: 92 mg/dL (ref 70–99)
Potassium: 2.9 mmol/L — ABNORMAL LOW (ref 3.5–5.1)
Sodium: 132 mmol/L — ABNORMAL LOW (ref 135–145)
Total Bilirubin: 0.5 mg/dL (ref 0.3–1.2)
Total Protein: 6.7 g/dL (ref 6.5–8.1)

## 2022-10-15 LAB — URINALYSIS, ROUTINE W REFLEX MICROSCOPIC
Bilirubin Urine: NEGATIVE
Glucose, UA: NEGATIVE mg/dL
Ketones, ur: NEGATIVE mg/dL
Nitrite: NEGATIVE
Protein, ur: NEGATIVE mg/dL
Specific Gravity, Urine: 1.008 (ref 1.005–1.030)
WBC, UA: >50 WBC/hpf (ref 0–5)
pH: 6 (ref 5.0–8.0)

## 2022-10-15 LAB — LIPASE, BLOOD: Lipase: 37 U/L (ref 11–51)

## 2022-10-15 LAB — CBC
HCT: 33.7 % — ABNORMAL LOW (ref 36.0–46.0)
Hemoglobin: 10.4 g/dL — ABNORMAL LOW (ref 12.0–15.0)
MCH: 24 pg — ABNORMAL LOW (ref 26.0–34.0)
MCHC: 30.9 g/dL (ref 30.0–36.0)
MCV: 77.6 fL — ABNORMAL LOW (ref 80.0–100.0)
Platelets: 263 10*3/uL (ref 150–400)
RBC: 4.34 MIL/uL (ref 3.87–5.11)
RDW: 25.5 % — ABNORMAL HIGH (ref 11.5–15.5)
WBC: 6.2 10*3/uL (ref 4.0–10.5)
nRBC: 0 % (ref 0.0–0.2)

## 2022-10-15 MED ORDER — CEFDINIR 300 MG PO CAPS: 300.0000 mg | ORAL_CAPSULE | Freq: Two times a day (BID) | ORAL | 0 refills | Status: AC

## 2022-10-15 MED ORDER — IOHEXOL 300 MG/ML  SOLN
100.0000 mL | Freq: Once | INTRAMUSCULAR | Status: AC | PRN
  Administered 2022-10-15: 100 mL via INTRAVENOUS

## 2022-10-15 MED ORDER — POTASSIUM CHLORIDE CRYS ER 20 MEQ PO TBCR
40.0000 meq | EXTENDED_RELEASE_TABLET | Freq: Once | ORAL | Status: AC
  Administered 2022-10-15: 40 meq via ORAL
  Filled 2022-10-15: qty 2

## 2022-10-15 MED ORDER — SODIUM CHLORIDE 0.9 % IV BOLUS
500.0000 mL | Freq: Once | INTRAVENOUS | Status: AC
  Administered 2022-10-15: 500 mL via INTRAVENOUS

## 2022-10-15 MED ORDER — SODIUM CHLORIDE 0.9 % IV SOLN
1.0000 g | INTRAVENOUS | Status: DC
  Administered 2022-10-15: 1 g via INTRAVENOUS
  Filled 2022-10-15: qty 10

## 2022-10-15 NOTE — ED Triage Notes (Signed)
Pt sts that she has been having weakness with abd pain since yesterday. Pt sts that she believes that she has a UTI. Pt sts that she pain with urination and a odor.

## 2022-10-15 NOTE — Discharge Instructions (Signed)
Your urine appears to be infected, and on your CAT scan it appears that the infection has gone up to your kidney.  You were given a dose of IV antibiotics in the emergency department.  Please continue to take the antibiotics by mouth for the next 10 days as prescribed.  Please return the emergency department if you develop any new, worsening, or change in symptoms or other concerns including but not limited to fever or shaking chills or worsening pain.  Please follow-up with your outpatient provider this week.  It was a pleasure caring for you today.

## 2022-10-15 NOTE — ED Provider Notes (Signed)
[facility] Provider Note    Event Date/Time   First MD Initiated Contact with Patient 10/15/22 1416     (approximate)   History   Abdominal Pain   HPI  Sydney Evans is a 72 y.o. female with a past medical history of hyperlipidemia, hypertension, TIA, who presents today for evaluation of feeling weak, abdominal pain, and burning with urination for the past 1 week.  She denies fevers or chills.  She reports that she has pain in her upper abdomen and also in her right lower quadrant.  She denies back pain or flank pain.  No nausea or vomiting.  No chest pain or shortness of breath.  No black or bloody stool.  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   History of cervical cancer 09/03/2021   History of uterine cancer 09/03/2021   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   Critical limb ischemia of right lower extremity (HCC) 08/20/2021   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   Open-angle glaucoma 08/01/2021   Hypocalcemia 08/01/2021   Age-related osteoporosis without current pathological fracture 08/01/2021   Vitamin D deficiency 08/01/2021   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) 08/01/2021   Drug-induced constipation 08/01/2021   Limitation of activity due to disability 08/01/2021   Need for assistance with personal care 08/01/2021   Tobacco use disorder 07/04/2021   Sleep disturbance 10/06/2020   Duodenal nodule 03/17/2020   Bipolar  depression (HCC) 11/17/2019   Anxiety 11/17/2019   Hypokalemia 10/07/2019   Hypomagnesemia 10/07/2019   NSVT (nonsustained ventricular tachycardia) (HCC)    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      Excl. in GC?     Most recent vital signs: Vitals:   10/15/22 1304 10/15/22 1732  BP: 114/73 118/70  Pulse: 64 60  Resp: 17 16  Temp: 98 F (36.7 C) 98.3 F (36.8 C)  SpO2: 96% 96%    Physical Exam Vitals and nursing note reviewed.  Constitutional:      General: Awake and alert. No acute distress.    Appearance: Normal appearance. The patient  is overweight.  HENT:     Head: Normocephalic and atraumatic.     Mouth: Mucous membranes are moist.  Eyes:     General: PERRL. Normal EOMs        Right eye: No discharge.        Left eye: No discharge.     Conjunctiva/sclera: Conjunctivae normal.  Cardiovascular:     Rate and Rhythm: Normal rate and regular rhythm.     Pulses: Normal pulses.  Pulmonary:     Effort: Pulmonary effort is normal. No respiratory distress.     Breath sounds: Normal breath sounds.  Abdominal:     Abdomen is soft. There is suprapubic, right lower quadrant, and epigastric abdominal tenderness. No rebound or guarding. No distention. Musculoskeletal:        General: No swelling. Normal range of motion.     Cervical back: Normal  range of motion and neck supple.  Skin:    General: Skin is warm and dry.     Capillary Refill: Capillary refill takes less than 2 seconds.     Findings: No rash.  Neurological:     Mental Status: The patient is awake and alert.      ED Results / Procedures / Treatments   Labs (all labs ordered are listed, but only abnormal results are displayed) Labs Reviewed  COMPREHENSIVE METABOLIC PANEL - Abnormal; Notable for the following components:      Result Value   Sodium 132 (*)    Potassium 2.9 (*)    CO2 17 (*)    Calcium 8.7 (*)    Albumin 3.3 (*)    All other components within normal limits  CBC - Abnormal; Notable for the following components:   Hemoglobin 10.4 (*)    HCT 33.7 (*)    MCV 77.6 (*)    MCH 24.0 (*)    RDW 25.5 (*)    All other components within normal limits  URINALYSIS, ROUTINE W REFLEX MICROSCOPIC - Abnormal; Notable for the following components:   Color, Urine YELLOW (*)    APPearance HAZY (*)    Hgb urine dipstick MODERATE (*)    Leukocytes,Ua LARGE (*)    Bacteria, UA RARE (*)    All other components within normal limits  LIPASE, BLOOD     EKG     RADIOLOGY I independently reviewed and interpreted imaging and agree with radiologists findings.     PROCEDURES:  Critical Care performed:   Procedures   MEDICATIONS ORDERED IN ED: Medications  cefTRIAXone (ROCEPHIN) 1 g in sodium chloride 0.9 % 100 mL IVPB (1 g Intravenous New Bag/Given 10/15/22 1537)  sodium chloride 0.9 % bolus 500 mL (500 mLs Intravenous New Bag/Given 10/15/22 1536)  potassium chloride SA (KLOR-CON M) CR tablet 40 mEq (40 mEq Oral Given 10/15/22 1538)  iohexol (OMNIPAQUE) 300 MG/ML solution 100 mL (100 mLs Intravenous Contrast Given 10/15/22 1557)     IMPRESSION / MDM / ASSESSMENT AND PLAN / ED COURSE  I reviewed the triage vital signs and the nursing notes.   Differential diagnosis includes, but is not limited to, urinary tract infection, pyelonephritis,  cholecystitis, pancreatitis, electrolyte disarray, anemia.  Patient is awake and alert, hemodynamically stable and afebrile.  Labs obtained in triage reveal stable H&H, actually improved from prior, and no leukocytosis, her urinalysis is suggestive of a urinary tract infection.  Given her diffuse abdominal tenderness that is not just in her suprapubic area as I would expect for urinary tract infection, CT scan  was obtained.  In the meantime, she was given a dose of Rocephin for her urinary tract infection.  I reviewed the patient's chart.  Patient's most recent urine culture was 08/08/2022 and revealed pansensitive E. coli.  CT reveals left-sided pyelonephritis.  I considered admission, however patient is hemodynamically stable, afebrile, tolerating p.o., and her pain is well-controlled.  She has no leukocytosis.  She does not appear to be septic.  Patient is with her family member and they feel comfortable with discharge home.  She was given Rocephin in the emergency department and discharged home with 10 days of antibiotics.  She was instructed to complete the antibiotics even if she begins to feel better.  We discussed very strict return precautions and the importance of close outpatient follow-up.  Patient understands and agrees with plan.  She was discharged in stable condition with her family member.  Patient's presentation is most consistent with acute complicated illness / injury requiring diagnostic workup.    FINAL CLINICAL IMPRESSION(S) / ED DIAGNOSES   Final diagnoses:  Pyelonephritis     Rx / DC Orders   ED Discharge Orders          Ordered    cefdinir (OMNICEF) 300 MG capsule  2 times daily        10/15/22 1728             Note:  This document was prepared using Dragon voice recognition software and may include unintentional dictation errors.   Keturah Shavers 10/15/22 Junius Roads, MD 10/15/22 2034

## 2022-10-16 ENCOUNTER — Telehealth: Payer: Self-pay

## 2022-10-16 NOTE — Transitions of Care (Post Inpatient/ED Visit) (Signed)
I spoke with pt; pt seen [facility] ED on 10/15/22 with pyelonephritis;  pt was advised by ED if condition worsens for pt to return to ED. 10/16/22 pt has worsening abd pain now pain level is 8 . last vomited 10/15/22; pt having dry heaves this morning. pt cannot find thermometer. pt cannot hold urine.pt has H/A SOB when sitting. This morning pt has new symptom of gold ball sized lump on end of chin. Pt does not want 911 called.Pt said Marylu Lund a neighbor will take pt to [facility] ED now. Sending note to T Dugal FNP.      10/16/2022  Name: Sydney Evans MRN: [id number] DOB: Jan 06, 1951 Today's TOC FU Call Status: Today's TOC FU Call Status:: Successful TOC FU Call Competed TOC FU Call Complete Date: 10/16/22  Transition Care Management Follow-up Telephone Call Date of Discharge: 10/15/22 Discharge Facility: [facility] Type of Discharge: Emergency Department Reason for ED Visit: Renal (abdominal pain and pain upon urination;pt dx with pyelonephritis) How have you been since you were released from the hospital?: Worse (pt was advised by ED if condition worsens for pt to return to ED. 10/16/22 pt has worsening abd pain now pain level is 8 . last vomited 10/15/22; pt having dry heaves. pt cannot find thermometer. pt cannot hold urine.pt has H/A SOB when sitting.) Any questions or concerns?: Yes Patient Questions/Concerns:: this morning has golf ball size lump on end of chin., Patient Questions/Concerns Addressed: Notified Provider of Patient Questions/Concerns  Items Reviewed: Did you receive and understand the discharge instructions provided?: Yes Medications obtained,verified, and reconciled?: Partial Review Completed Reason for Partial Mediation Review: discussed new abx cefdinir which pt will start today but due to pt worsened condition and pt returning to ED did not review entire med list. Any new allergies since your discharge?: No Dietary orders reviewed?: NA Do you have  support at home?: No People in Home: alone, child(ren), adult Name of Support/Comfort Primary Source: Tresa Endo pts daughter but is presently working  Medications Reviewed Today: Medications Reviewed Today     Reviewed by Lucas Mallow, RN (Registered Nurse) on 10/15/22 at 1734  Med List Status: <None>   Medication Order Taking? Sig Documenting Provider Last Dose Status Informant  acetaminophen (TYLENOL) 500 MG tablet [id number]  Take 500 mg by mouth every 6 (six) hours as needed. [provider]  Active   albuterol (VENTOLIN HFA) 108 (90 Base) MCG/ACT inhaler [id number]  Inhale 2 puffs into the lungs every 4 (four) hours as needed for shortness of breath. Mort Sawyers, FNP  Active   amLODipine (NORVASC) 5 MG tablet [id number]  Take 1 tablet (5 mg total) by mouth daily. Mort Sawyers, FNP  Active   Carboxymethylcellulose Sodium (LUBRICANT EYE DROPS OP) [id number]  Place 1 drop into both eyes daily as needed (Dry eye). Systane [provider]  Active Self  citalopram (CELEXA) 40 MG tablet [id number]  Take 1 tablet (40 mg total) by mouth daily. Neysa Hotter, MD  Expired 09/10/22 2359   clindamycin (CLINDAGEL) 1 % gel [id number]  Apply topically 2 (two) times daily.  Patient not taking: Reported on 09/24/2022   Mort Sawyers, FNP  Active   diphenhydrAMINE (BENADRYL) 25 MG tablet [id number]  Take 25 mg by mouth every 6 (six) hours as needed for itching.  Patient not taking: Reported on 09/24/2022   [provider]  Active Self           Med Note Lenoria Farrier   Fri Sep 17, 2019 10:42 AM)    diphenoxylate-atropine (LOMOTIL) 2.5-0.025 MG tablet [id number]  Take 1-2 tablets by mouth 4 (four) times daily as needed for diarrhea or loose stools (max 8 tabs per day). Shaune Pollack, MD  Active   gabapentin (NEURONTIN) 300 MG capsule [id number]  Take 1 capsule (300 mg total) by mouth 4 (four) times daily. Mort Sawyers, FNP  Active   lamoTRIgine (LAMICTAL) 200 MG tablet  [id number]  Take 1 tablet (200 mg total) by mouth daily. Neysa Hotter, MD  Active   pantoprazole (PROTONIX) 40 MG tablet [id number]  Take 1 tablet (40 mg total) by mouth 2 (two) times daily. Mort Sawyers, FNP  Active   Probiotic Product (FORTIFY PROBIOTIC WOMENS PO) [id number]  Take 1 Capful by mouth daily. [provider]  Active   propranolol (INDERAL) 20 MG tablet [id number]  Take 1 tablet (20 mg total) by mouth daily as needed. Mort Sawyers, FNP  Active   valACYclovir (VALTREX) 500 MG tablet [id number]  Take 1 tablet (500 mg total) by mouth daily. Mort Sawyers, FNP  Active             Home Care and Equipment/Supplies: Were Home Health Services Ordered?: NA Any new equipment or medical supplies ordered?: NA  Functional Questionnaire: Do you need assistance with bathing/showering or dressing?: No Do you need assistance with meal preparation?: No Do you need assistance with eating?: No Do you have difficulty maintaining continence: Yes (pt cannot hold urine) Do you need assistance with getting out of bed/getting out of a chair/moving?: No Do you have difficulty managing or taking your medications?: No  Follow up appointments reviewed: PCP Follow-up appointment confirmed?:  (pt is going to cb to schedule ED FU since pt is returning to ED now.) MD Provider Line [phone] Given: Yes Specialist Hospital Follow-up appointment confirmed?: NA Do you need transportation to your follow-up appointment?: No Do you understand care options if your condition(s) worsen?: Yes-patient verbalized understanding (pt is returning to ED now with Marylu Lund pts neighbor.)    SIGNATURE Lewanda Rife, LPN

## 2022-10-16 NOTE — Telephone Encounter (Signed)
Yes I agree with this plan. Glad to hear she is going to the ER. Thanks for your recommendations.

## 2022-10-22 ENCOUNTER — Telehealth: Payer: Self-pay | Admitting: Family

## 2022-10-22 NOTE — Telephone Encounter (Signed)
Home Health verbal orders Caller Name: Junious Dresser Agency Name: [facility]  Callback number: [phone]  Requesting OT  Frequency: 1x a week for 5 weeks  Please forward to [facility] pool or providers CMA

## 2022-10-22 NOTE — Telephone Encounter (Signed)
Called left message on  [facility] confidential voice mail. Provided patient name and date of birth and verbal orders as approved below.
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
## 2022-11-04 ENCOUNTER — Telehealth: Payer: Self-pay | Admitting: Family

## 2022-11-04 NOTE — Telephone Encounter (Signed)
Noted  

## 2022-11-04 NOTE — Telephone Encounter (Signed)
Sean from [facility] called to let Sydney Evans know that the pt fell on Sat, 6/29, in her daughter's bathroom. Gregary Signs stated the pt didn't have any injuries. Gregary Signs stated the pt usually has shoulder pain, but didn't complain about any pain. Call back # [phone]

## 2022-11-05 ENCOUNTER — Telehealth (INDEPENDENT_AMBULATORY_CARE_PROVIDER_SITE_OTHER): Payer: 59 | Admitting: Psychiatry

## 2022-11-05 ENCOUNTER — Encounter: Payer: Self-pay | Admitting: Psychiatry

## 2022-11-05 DIAGNOSIS — F4310 Post-traumatic stress disorder, unspecified: Secondary | ICD-10-CM

## 2022-11-05 DIAGNOSIS — F319 Bipolar disorder, unspecified: Secondary | ICD-10-CM

## 2022-11-05 MED ORDER — CITALOPRAM HYDROBROMIDE 40 MG PO TABS: 40.0000 mg | ORAL_TABLET | Freq: Every day | ORAL | 1 refills | Status: DC

## 2022-11-05 MED ORDER — LAMOTRIGINE 200 MG PO TABS
200.0000 mg | ORAL_TABLET | Freq: Every day | ORAL | 0 refills | Status: AC
Start: 2022-11-05 — End: 2023-02-03

## 2022-11-11 ENCOUNTER — Other Ambulatory Visit: Payer: Self-pay | Admitting: Family

## 2022-11-11 ENCOUNTER — Telehealth: Payer: Self-pay

## 2022-11-11 DIAGNOSIS — I10 Essential (primary) hypertension: Secondary | ICD-10-CM

## 2022-11-11 DIAGNOSIS — Z89511 Acquired absence of right leg below knee: Secondary | ICD-10-CM

## 2022-11-11 DIAGNOSIS — I70221 Atherosclerosis of native arteries of extremities with rest pain, right leg: Secondary | ICD-10-CM

## 2022-11-11 DIAGNOSIS — K219 Gastro-esophageal reflux disease without esophagitis: Secondary | ICD-10-CM

## 2022-11-11 NOTE — Telephone Encounter (Signed)
Spoke with pt relaying Dr. Timoteo Expose message. Pt verbalizes understanding about propranolol. However, she kept declining OV's offered. Finally agreed to see Jae Dire on 11/13/22 at 3:00. Pt is scheduled. Fyi to Dr. Reece Agar.

## 2022-11-11 NOTE — Telephone Encounter (Signed)
Noted pt refusal to seek urgent care.  Agree with avoiding propranolol Please schedule asap appt with next available provider in office - needs evaluation for symptomatic bradycardia.

## 2022-11-11 NOTE — Telephone Encounter (Signed)
Melissa OT with [facility] called and today pts BP 147/75 P 42 pulse ox 94% room air. Pt heart rate is usually 50s to 60s. Low heart rate and feeling really fatigued started on 11/10/22. Pt is not having any CP,SOB.H/A or dizziness.pt is not having any abd or back pain and no other complaints except low mheart rate and feeling fatigued. Melissa had pt do some arm movements and P now is 44 and pulse ox is 96% room air. Pt has not been taking propranolol and pt cannot remember last time she took that med. Pt is taking amlodipine 5 mg daily and ASA 81 mg daily.. pt does not want to go anywhere to be seen. UC & ED precautions given and Melissa voiced understanding and will convey to pt.., T Dugal out of office until 11/14/22 and sending note to Dr Reece Agar who is in office and G pool. And will teams [facility] CMA.

## 2022-11-11 NOTE — Telephone Encounter (Signed)
Noted.  Will forward to Kate as FYI. °

## 2022-11-11 NOTE — Telephone Encounter (Signed)
Medication: Valacyclovir 500 mg  Directions: Take 1 tablet po daily  Last given: 10/01/22 Number refills: 0 Last o/v: 09/05/22 Follow up: 1 month (around 10/06/22) Labs: 10/15/22

## 2022-11-12 NOTE — Telephone Encounter (Signed)
Called and left a voice message asking patient to give office a call back to answer a question about her medication.

## 2022-11-12 NOTE — Telephone Encounter (Signed)
Can we confirm pt still taking valtrex daily?

## 2022-11-12 NOTE — Telephone Encounter (Signed)
Noted, will evaluate. 

## 2022-11-12 NOTE — Telephone Encounter (Signed)
Patient received call and stated that she is not taking the valtrex,"she doesn't know what that medication is". She did say that sh needs her b/p medication and the medication for her stomach,which she did not know the names off as well. She said that she takes a little blue pill everyday and her psych meds.

## 2022-11-13 ENCOUNTER — Ambulatory Visit: Payer: 59 | Admitting: Primary Care

## 2022-11-14 NOTE — Telephone Encounter (Signed)
Pantoprazole and amlodipine were filled for one year 5/24. Should have at pharmacy.  Also advise pt to come in with all medication bottles to next office visit. Pt has been very forgetful about what meds she is taking and we need to clarify we have the same list.

## 2022-11-14 NOTE — Telephone Encounter (Signed)
Patient is no longer using mail order. She has not been able to get anything that was given. She needs all script resent to [facility]. I have updated pharmacy.

## 2022-11-15 MED ORDER — AMLODIPINE BESYLATE 5 MG PO TABS
5.0000 mg | ORAL_TABLET | Freq: Every day | ORAL | 3 refills | Status: DC
Start: 2022-11-15 — End: 2023-08-15

## 2022-11-15 MED ORDER — GABAPENTIN 300 MG PO CAPS
300.0000 mg | ORAL_CAPSULE | Freq: Four times a day (QID) | ORAL | 3 refills | Status: DC
Start: 2022-11-15 — End: 2023-03-10

## 2022-11-15 MED ORDER — PANTOPRAZOLE SODIUM 40 MG PO TBEC
40.0000 mg | DELAYED_RELEASE_TABLET | Freq: Two times a day (BID) | ORAL | 1 refills | Status: DC
Start: 2022-11-15 — End: 2023-05-14

## 2022-11-15 NOTE — Telephone Encounter (Signed)
Noted refills sent to [facility]

## 2022-11-19 ENCOUNTER — Telehealth: Payer: Self-pay | Admitting: Family

## 2022-11-19 NOTE — Telephone Encounter (Signed)
Home Health verbal orders Caller Name:CONNIE Agency Name: [facility] number: [phone]  Requesting OT/PT/Skilled nursing/Social Work/Speech:OT ,SOCIAL WORKER   Reason:PAIN ON RIGHT SHOULDER,THERAPY EXERCISES  Frequency:1WK 8 SW: FOR EVALUATE NEEDS FOR LONG TERM CARE PLANNING    Please forward to [facility] pool or providers CMA

## 2022-11-19 NOTE — Telephone Encounter (Signed)
Phone call to Sula Soda approved verbal orders.

## 2022-12-02 DIAGNOSIS — I6521 Occlusion and stenosis of right carotid artery: Secondary | ICD-10-CM | POA: Insufficient documentation

## 2022-12-09 ENCOUNTER — Other Ambulatory Visit: Payer: Self-pay

## 2022-12-09 DIAGNOSIS — D509 Iron deficiency anemia, unspecified: Secondary | ICD-10-CM

## 2022-12-10 ENCOUNTER — Inpatient Hospital Stay: Payer: 59

## 2022-12-10 ENCOUNTER — Telehealth: Payer: Self-pay | Admitting: Family

## 2022-12-10 ENCOUNTER — Inpatient Hospital Stay: Payer: 59 | Attending: Internal Medicine | Admitting: Internal Medicine

## 2022-12-10 VITALS — BP 128/57 | HR 47 | Temp 98.6°F | Wt 165.0 lb

## 2022-12-10 VITALS — BP 145/50 | HR 50

## 2022-12-10 DIAGNOSIS — D509 Iron deficiency anemia, unspecified: Secondary | ICD-10-CM

## 2022-12-10 DIAGNOSIS — I10 Essential (primary) hypertension: Secondary | ICD-10-CM | POA: Diagnosis not present

## 2022-12-10 DIAGNOSIS — R197 Diarrhea, unspecified: Secondary | ICD-10-CM | POA: Insufficient documentation

## 2022-12-10 DIAGNOSIS — K2940 Chronic atrophic gastritis without bleeding: Secondary | ICD-10-CM | POA: Diagnosis not present

## 2022-12-10 DIAGNOSIS — K219 Gastro-esophageal reflux disease without esophagitis: Secondary | ICD-10-CM | POA: Insufficient documentation

## 2022-12-10 DIAGNOSIS — E538 Deficiency of other specified B group vitamins: Secondary | ICD-10-CM

## 2022-12-10 LAB — IRON AND TIBC
Iron: 42 ug/dL (ref 28–170)
Saturation Ratios: 12 % (ref 10.4–31.8)
TIBC: 367 ug/dL (ref 250–450)
UIBC: 325 ug/dL

## 2022-12-10 LAB — FERRITIN: Ferritin: 14 ng/mL (ref 11–307)

## 2022-12-10 LAB — VITAMIN B12: Vitamin B-12: 195 pg/mL (ref 180–914)

## 2022-12-10 LAB — CBC WITH DIFFERENTIAL (CANCER CENTER ONLY)
Abs Immature Granulocytes: 0.02 10*3/uL (ref 0.00–0.07)
Basophils Absolute: 0.1 10*3/uL (ref 0.0–0.1)
Basophils Relative: 1 %
Eosinophils Absolute: 0.2 10*3/uL (ref 0.0–0.5)
Eosinophils Relative: 3 %
HCT: 35.2 % — ABNORMAL LOW (ref 36.0–46.0)
Hemoglobin: 11.2 g/dL — ABNORMAL LOW (ref 12.0–15.0)
Immature Granulocytes: 0 %
Lymphocytes Relative: 34 %
Lymphs Abs: 1.9 10*3/uL (ref 0.7–4.0)
MCH: 26.9 pg (ref 26.0–34.0)
MCHC: 31.8 g/dL (ref 30.0–36.0)
MCV: 84.6 fL (ref 80.0–100.0)
Monocytes Absolute: 0.4 10*3/uL (ref 0.1–1.0)
Monocytes Relative: 7 %
Neutro Abs: 3.1 10*3/uL (ref 1.7–7.7)
Neutrophils Relative %: 55 %
Platelet Count: 279 10*3/uL (ref 150–400)
RBC: 4.16 MIL/uL (ref 3.87–5.11)
RDW: 18.2 % — ABNORMAL HIGH (ref 11.5–15.5)
WBC Count: 5.6 10*3/uL (ref 4.0–10.5)
nRBC: 0 % (ref 0.0–0.2)

## 2022-12-10 MED ORDER — SODIUM CHLORIDE 0.9% FLUSH
10.0000 mL | Freq: Once | INTRAVENOUS | Status: AC | PRN
  Administered 2022-12-10: 10 mL
  Filled 2022-12-10: qty 10

## 2022-12-10 MED ORDER — SODIUM CHLORIDE 0.9 % IV SOLN
200.0000 mg | Freq: Once | INTRAVENOUS | Status: AC
  Administered 2022-12-10: 200 mg via INTRAVENOUS
  Filled 2022-12-10: qty 200

## 2022-12-10 MED ORDER — SODIUM CHLORIDE 0.9 % IV SOLN
Freq: Once | INTRAVENOUS | Status: AC
  Filled 2022-12-10: qty 250

## 2022-12-10 NOTE — Progress Notes (Signed)
Patient was not aware of any labs to be done yesterday. So she did not get labs taken yesterday.   Today she feels like she can't even hold her head up, extremely tired.  Fatigue has worsened since her last visit which was about 3 months ago.   Nausea, no other new symptoms.  Pain goes up and down, her pain has gotten up to about an 8.  Patient says that her stomach is really weird, so one day you have something but come the next day can't touch it without getting sick.

## 2022-12-10 NOTE — Telephone Encounter (Signed)
Called patient she had stopped taking it about 3 months ago. She would like to start back. She was told that it would be good to help with preventing additional blockage in arteries.

## 2022-12-10 NOTE — Telephone Encounter (Signed)
Med not on med list, will route to PCP for review

## 2022-12-10 NOTE — Telephone Encounter (Signed)
Prescription Request  12/10/2022  LOV: 09/05/2022  What is the name of the medication or equipment? atorvastatin 40 mg  Have you contacted your pharmacy to request a refill? Yes   Which pharmacy would you like this sent to?  [facility] [address] ([address]), [place] - [address] [place]) [place] [address] Phone: [phone] Fax: [phone]    Patient notified that their request is being sent to the clinical staff for review and that they should receive a response within 2 business days.   Please advise at Mobile [phone]   [facility] called requesting refill for pt. Told [facility] meds aren't on pt's current med list.

## 2022-12-10 NOTE — Patient Instructions (Signed)
Iron Sucrose Injection What is this medication? IRON SUCROSE (EYE ern SOO krose) treats low levels of iron (iron deficiency anemia) in people with kidney disease. Iron is a mineral that plays an important role in making red blood cells, which carry oxygen from your lungs to the rest of your body. This medicine may be used for other purposes; ask your health care provider or pharmacist if you have questions. COMMON BRAND NAME(S): Venofer What should I tell my care team before I take this medication? They need to know if you have any of these conditions: Anemia not caused by low iron levels Heart disease High levels of iron in the blood Kidney disease Liver disease An unusual or allergic reaction to iron, other medications, foods, dyes, or preservatives Pregnant or trying to get pregnant Breastfeeding How should I use this medication? This medication is for infusion into a vein. It is given in a hospital or clinic setting. Talk to your care team about the use of this medication in children. While this medication may be prescribed for children as young as 2 years for selected conditions, precautions do apply. Overdosage: If you think you have taken too much of this medicine contact a poison control center or emergency room at once. NOTE: This medicine is only for you. Do not share this medicine with others. What if I miss a dose? Keep appointments for follow-up doses. It is important not to miss your dose. Call your care team if you are unable to keep an appointment. What may interact with this medication? Do not take this medication with any of the following: Deferoxamine Dimercaprol Other iron products This medication may also interact with the following: Chloramphenicol Deferasirox This list may not describe all possible interactions. Give your health care provider a list of all the medicines, herbs, non-prescription drugs, or dietary supplements you use. Also tell them if you smoke,  drink alcohol, or use illegal drugs. Some items may interact with your medicine. What should I watch for while using this medication? Visit your care team regularly. Tell your care team if your symptoms do not start to get better or if they get worse. You may need blood work done while you are taking this medication. You may need to follow a special diet. Talk to your care team. Foods that contain iron include: whole grains/cereals, dried fruits, beans, or peas, leafy green vegetables, and organ meats (liver, kidney). What side effects may I notice from receiving this medication? Side effects that you should report to your care team as soon as possible: Allergic reactions--skin rash, itching, hives, swelling of the face, lips, tongue, or throat Low blood pressure--dizziness, feeling faint or lightheaded, blurry vision Shortness of breath Side effects that usually do not require medical attention (report to your care team if they continue or are bothersome): Flushing Headache Joint pain Muscle pain Nausea Pain, redness, or irritation at injection site This list may not describe all possible side effects. Call your doctor for medical advice about side effects. You may report side effects to FDA at 1-800-FDA-1088. Where should I keep my medication? This medication is given in a hospital or clinic. It will not be stored at home. NOTE: This sheet is a summary. It may not cover all possible information. If you have questions about this medicine, talk to your doctor, pharmacist, or health care provider.  2024 Elsevier/Gold Standard (2022-09-27 00:00:00)

## 2022-12-10 NOTE — Progress Notes (Signed)
Patient tolerated Venofer infusion well. Explained recommendation of 30 min post monitoring. Patient refused to wait post monitoring. Educated on what signs to watch for & to call with any concerns. No questions, discharged. Stable  

## 2022-12-10 NOTE — Telephone Encounter (Signed)
Can we call pt to confirm that she is still taking atorvastatin? This is one of the medications she had self d/c in the past.

## 2022-12-11 ENCOUNTER — Ambulatory Visit: Payer: 59 | Admitting: Dermatology

## 2022-12-11 ENCOUNTER — Other Ambulatory Visit: Payer: Self-pay | Admitting: Family

## 2022-12-11 DIAGNOSIS — I6523 Occlusion and stenosis of bilateral carotid arteries: Secondary | ICD-10-CM

## 2022-12-11 DIAGNOSIS — I10 Essential (primary) hypertension: Secondary | ICD-10-CM

## 2022-12-11 MED ORDER — ATORVASTATIN CALCIUM 10 MG PO TABS
10.0000 mg | ORAL_TABLET | Freq: Every day | ORAL | 3 refills | Status: DC
Start: 2022-12-11 — End: 2023-03-27

## 2022-12-11 NOTE — Progress Notes (Unsigned)
arto

## 2022-12-11 NOTE — Telephone Encounter (Signed)
Refilled and sent to [facility].

## 2022-12-12 ENCOUNTER — Other Ambulatory Visit: Payer: Self-pay

## 2022-12-12 ENCOUNTER — Emergency Department: Payer: 59

## 2022-12-12 ENCOUNTER — Emergency Department
Admission: EM | Admit: 2022-12-12 | Discharge: 2022-12-12 | Disposition: A | Discharge status: Home / Self Care | Payer: 59 | Attending: Emergency Medicine | Admitting: Emergency Medicine

## 2022-12-12 ENCOUNTER — Encounter: Payer: Self-pay | Admitting: Internal Medicine

## 2022-12-12 DIAGNOSIS — Z89511 Acquired absence of right leg below knee: Secondary | ICD-10-CM | POA: Diagnosis not present

## 2022-12-12 DIAGNOSIS — E538 Deficiency of other specified B group vitamins: Secondary | ICD-10-CM | POA: Insufficient documentation

## 2022-12-12 DIAGNOSIS — S0083XA Contusion of other part of head, initial encounter: Secondary | ICD-10-CM | POA: Insufficient documentation

## 2022-12-12 DIAGNOSIS — I10 Essential (primary) hypertension: Secondary | ICD-10-CM | POA: Insufficient documentation

## 2022-12-12 DIAGNOSIS — R1084 Generalized abdominal pain: Secondary | ICD-10-CM | POA: Diagnosis not present

## 2022-12-12 DIAGNOSIS — W19XXXA Unspecified fall, initial encounter: Secondary | ICD-10-CM

## 2022-12-12 LAB — COMPREHENSIVE METABOLIC PANEL
ALT: 9 U/L (ref 0–44)
AST: 15 U/L (ref 15–41)
Albumin: 3.4 g/dL — ABNORMAL LOW (ref 3.5–5.0)
Alkaline Phosphatase: 64 U/L (ref 38–126)
Anion gap: 10 (ref 5–15)
BUN: 10 mg/dL (ref 8–23)
CO2: 18 mmol/L — ABNORMAL LOW (ref 22–32)
Calcium: 8 mg/dL — ABNORMAL LOW (ref 8.9–10.3)
Chloride: 110 mmol/L (ref 98–111)
Creatinine, Ser: 1.02 mg/dL — ABNORMAL HIGH (ref 0.44–1.00)
GFR, Estimated: 59 mL/min — ABNORMAL LOW (ref >60)
Glucose, Bld: 99 mg/dL (ref 70–99)
Potassium: 3.3 mmol/L — ABNORMAL LOW (ref 3.5–5.1)
Sodium: 138 mmol/L (ref 135–145)
Total Bilirubin: 0.2 mg/dL — ABNORMAL LOW (ref 0.3–1.2)
Total Protein: 6.8 g/dL (ref 6.5–8.1)

## 2022-12-12 LAB — CBC
HCT: 40.2 % (ref 36.0–46.0)
Hemoglobin: 12.4 g/dL (ref 12.0–15.0)
MCH: 27.1 pg (ref 26.0–34.0)
MCHC: 30.8 g/dL (ref 30.0–36.0)
MCV: 88 fL (ref 80.0–100.0)
Platelets: 281 10*3/uL (ref 150–400)
RBC: 4.57 MIL/uL (ref 3.87–5.11)
RDW: 18.4 % — ABNORMAL HIGH (ref 11.5–15.5)
WBC: 6 10*3/uL (ref 4.0–10.5)
nRBC: 0 % (ref 0.0–0.2)

## 2022-12-12 LAB — URINALYSIS, ROUTINE W REFLEX MICROSCOPIC
Bilirubin Urine: NEGATIVE
Glucose, UA: NEGATIVE mg/dL
Hgb urine dipstick: NEGATIVE
Ketones, ur: NEGATIVE mg/dL
Nitrite: NEGATIVE
Protein, ur: NEGATIVE mg/dL
Specific Gravity, Urine: 1.019 (ref 1.005–1.030)
pH: 6 (ref 5.0–8.0)

## 2022-12-12 LAB — LIPASE, BLOOD: Lipase: 34 U/L (ref 11–51)

## 2022-12-12 MED ORDER — MORPHINE SULFATE (PF) 4 MG/ML IV SOLN
4.0000 mg | Freq: Once | INTRAVENOUS | Status: DC
  Filled 2022-12-12: qty 1

## 2022-12-12 MED ORDER — MORPHINE SULFATE (PF) 4 MG/ML IV SOLN
4.0000 mg | Freq: Once | INTRAVENOUS | Status: AC
  Administered 2022-12-12: 4 mg via INTRAVENOUS

## 2022-12-12 MED ORDER — IOHEXOL 300 MG/ML  SOLN
100.0000 mL | Freq: Once | INTRAMUSCULAR | Status: AC | PRN
  Administered 2022-12-12: 100 mL via INTRAVENOUS

## 2022-12-12 NOTE — ED Triage Notes (Signed)
Pt arrived via EMS. Pt sts that pt has recent falls out of her wheel chair. Pt also sts that she is having abd pain.

## 2022-12-12 NOTE — Progress Notes (Signed)
[facility]  [phone] [phone]) [phone]  ID: Meta Hatchet OB: 1950-12-18  MR#: [id number]  HQI#:[id number]  Patient Care Team: Mort Sawyers, FNP as PCP - General (Family Medicine) Lucretia Roers Dory Larsen, MD as Referring Physician (Vascular Surgery) Wenda Overland, LCSW as Social Worker Michaelyn Barter, MD as Consulting Physician (Oncology)  REFERRING PROVIDER: Mort Sawyers, FNP  REASON FOR REFERRAL: Iron deficiency anemia  HPI: Sydney Evans is a 72 y.o. female with past medical history of iron deficiency anemia, Barrett's esophagus GERD, hypertension, migraine, right BKA for PVD in 2023 referred to hematology for management of iron deficiency anemia.  Patient reports history of diarrhea, nausea and vomiting for the past 3 months.  Follows with Dr. Mia Creek of GI.  Has not been able to eat. Endoscopic with Dr. Mia Creek on 12/14/2019 showed 11 mm submucosal nodule in the first portion of duodenum and a papillary lesion 3 mm in the duodenal bulb.  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  Colonoscopy from 2019 was normal.  CT abdomen pelvis (08/22/2022) done for abdominal pain- no acute abnormality  Longstanding history of iron deficiency anemia-previously seen by Dr. Cathie Hoops and received IV Venofer.  Last dose in February 2023.  Labs (09/05/2022) hemoglobin 9.5, MCV 67.6, iron 14, saturation 2.9% and ferritin 5.8.  Celiac panel negative.  Alpha gal panel pending.  FIT stool test and fecal calprotectin level pending.  Recommended repeat colonoscopy and endoscopy by Dr. Mia Creek.  She reports fatigue, very low energy level and craving for water.  Interval history Patient was seen today as follow-up for iron deficiency anemia and labs. She had 4 infusions of IV Venofer.  Tolerated well.  But does not report any improvement in her symptoms such as fatigue.  REVIEW OF SYSTEMS:   ROS  As per HPI. Otherwise, a complete review of  systems is negative.  PAST MEDICAL HISTORY: 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   Anemia    Anxiety    Arthritis    Barrett's esophagus    Bipolar disorder (HCC)    Blocked artery    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   Blood clot in vein    Cancer (HCC)    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   Carotid arterial disease (HCC)    Contracture of joint of upper arm    Critical limb ischemia of right lower extremity (HCC) 06/21/2021   Depression    Dislocation of right shoulder joint 10/06/2019   Disorder of bone and articular cartilage 09/29/2014   Elevated lipids    GERD (gastroesophageal reflux disease)    Hypertension    Migraine    Osteoporosis    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   Poor balance    Rotator cuff tendinitis, left 05/20/2016   Sinus congestion    Status post total shoulder arthroplasty, 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   Status post total shoulder replacement, 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   Stroke (HCC)    x 2   TIA (transient ischemic attack)    Traumatic complete tear of right rotator cuff 10/08/2019   Vertigo     PAST SURGICAL HISTORY: Past Surgical History:  Procedure Laterality Date   ABDOMINAL HYSTERECTOMY     pt states might still have ovaries   bka right     BREAST BIOPSY Left    CARPAL TUNNEL RELEASE Bilateral    CATARACT EXTRACTION W/ INTRAOCULAR LENS  IMPLANT, BILATERAL Bilateral    CHOLECYSTECTOMY     COLONOSCOPY WITH  PROPOFOL N/A 04/09/2018   Procedure: COLONOSCOPY WITH PROPOFOL;  Surgeon: Wyline Mood, MD;  Location: [facility] ENDOSCOPY;  Service: Gastroenterology;  Laterality: N/A;   crystal cyst removed on left foot     ESOPHAGOGASTRODUODENOSCOPY (EGD) WITH PROPOFOL N/A 04/09/2018   Procedure: ESOPHAGOGASTRODUODENOSCOPY (EGD) WITH PROPOFOL;  Surgeon: Wyline Mood, MD;  Location: [facility] ENDOSCOPY;  Service: Gastroenterology;  Laterality: N/A;   ESOPHAGOGASTRODUODENOSCOPY (EGD) WITH PROPOFOL N/A 12/14/2019   Procedure: ESOPHAGOGASTRODUODENOSCOPY (EGD) WITH  PROPOFOL;  Surgeon: Regis Bill, MD;  Location: [facility] ENDOSCOPY;  Service: Endoscopy;  Laterality: N/A;   EYE SURGERY     femoral fx     FOOT SURGERY     GIVENS CAPSULE STUDY N/A 06/16/2018   Procedure: GIVENS CAPSULE STUDY;  Surgeon: Wyline Mood, MD;  Location: [facility] ENDOSCOPY;  Service: Gastroenterology;  Laterality: N/A;   JOINT REPLACEMENT     KNEE SURGERY     KYPHOPLASTY     LOWER EXTREMITY ANGIOGRAPHY Right 06/21/2021   Procedure: Lower Extremity Angiography;  Surgeon: Renford Dills, MD;  Location: [facility] INVASIVE CV LAB;  Service: Cardiovascular;  Laterality: Right;   ORIF PERIPROSTHETIC FRACTURE Right 10/07/2019   Procedure: OPEN REDUCTION INTERNAL FIXATION (ORIF) PERIPROSTHETIC FRACTURE;  Surgeon: Christena Flake, MD;  Location: [facility] ORS;  Service: Orthopedics;  Laterality: Right;   TONSILLECTOMY     TOTAL KNEE ARTHROPLASTY Bilateral    TOTAL SHOULDER ARTHROPLASTY Left 08/01/2016   Procedure: TOTAL SHOULDER ARTHROPLASTY;  Surgeon: Christena Flake, MD;  Location: [facility] ORS;  Service: Orthopedics;  Laterality: Left;   TOTAL SHOULDER ARTHROPLASTY Right 09/30/2019   Procedure: TOTAL SHOULDER ARTHROPLASTY;  Surgeon: Christena Flake, MD;  Location: [facility] ORS;  Service: Orthopedics;  Laterality: Right;    FAMILY HISTORY: Family History  Problem Relation Age of Onset   Other Mother        ?lupus    Cancer Mother    High Cholesterol Mother    Alcohol abuse Father    CAD Father        CABG   High Cholesterol Father    Arthritis Father    Heart murmur Sister    Bradycardia Sister    Heart murmur Brother    High blood pressure Other        "for everybody"   High Cholesterol Other        "for everybody"    HEALTH MAINTENANCE: Social History   Tobacco Use   Smoking status: Every Day    Current packs/day: 0.00    Types: Cigarettes    Last attempt to quit: 08/04/2016    Years since quitting: 6.3   Smokeless tobacco: Never   Tobacco comments:    1 pack lasts about 2.5 days   Vaping Use   Vaping status: Never Used  Substance Use Topics   Alcohol use: No   Drug use: No     Allergies  Allergen Reactions   Hydroxyzine Hives and Rash   Bactrim [Sulfamethoxazole-Trimethoprim] Nausea And Vomiting   Tramadol Hives   Zofran [Ondansetron]     Broke mouth out   Dilaudid [Hydromorphone] Itching   Ketorolac Rash     May take with benadryl   Toradol [Ketorolac Tromethamine] Rash    May take with benadryl    Current Outpatient Medications  Medication Sig Dispense Refill   acetaminophen (TYLENOL) 500 MG tablet Take 500 mg by mouth every 6 (six) hours as needed.     albuterol (VENTOLIN HFA) 108 (90 Base) MCG/ACT inhaler Inhale 2 puffs  into the lungs every 4 (four) hours as needed for shortness of breath. 1 each 0   amLODipine (NORVASC) 5 MG tablet Take 1 tablet (5 mg total) by mouth daily. 90 tablet 3   Carboxymethylcellulose Sodium (LUBRICANT EYE DROPS OP) Place 1 drop into both eyes daily as needed (Dry eye). Systane     citalopram (CELEXA) 40 MG tablet Take 1 tablet (40 mg total) by mouth daily. 90 tablet 1   diphenoxylate-atropine (LOMOTIL) 2.5-0.025 MG tablet Take 1-2 tablets by mouth 4 (four) times daily as needed for diarrhea or loose stools (max 8 tabs per day). 30 tablet 0   gabapentin (NEURONTIN) 300 MG capsule Take 1 capsule (300 mg total) by mouth 4 (four) times daily. 360 capsule 3   lamoTRIgine (LAMICTAL) 200 MG tablet Take 1 tablet (200 mg total) by mouth daily. 90 tablet 0   pantoprazole (PROTONIX) 40 MG tablet Take 1 tablet (40 mg total) by mouth 2 (two) times daily. 180 tablet 1   Probiotic Product (FORTIFY PROBIOTIC WOMENS PO) Take 1 Capful by mouth daily.     propranolol (INDERAL) 20 MG tablet Take 1 tablet (20 mg total) by mouth daily as needed. 30 tablet 0   valACYclovir (VALTREX) 500 MG tablet Take 1 tablet (500 mg total) by mouth daily. 90 tablet 0   atorvastatin (LIPITOR) 10 MG tablet Take 1 tablet (10 mg total) by mouth daily. 90 tablet 3    No current facility-administered medications for this visit.    OBJECTIVE: Vitals:   12/10/22 1532  BP: (!) 128/57  Pulse: (!) 47  Temp: 98.6 F (37 C)  SpO2: 98%     Body mass index is 30.18 kg/m.      General: Well-developed, well-nourished, no acute distress. Eyes: Pink conjunctiva, anicteric sclera. HEENT: Normocephalic, moist mucous membranes, clear oropharnyx. Lungs: Clear to auscultation bilaterally. Heart: Regular rate and rhythm. No rubs, murmurs, or gallops. Abdomen: Soft, nontender, nondistended. No organomegaly noted, normoactive bowel sounds. Musculoskeletal: No edema, cyanosis, or clubbing. Neuro: Alert, answering all questions appropriately. Cranial nerves grossly intact. Skin: No rashes or petechiae noted. Psych: Normal affect. Lymphatics: No cervical, calvicular, axillary or inguinal LAD.   LAB RESULTS:  Lab Results  Component Value Date   NA 132 (L) 10/15/2022   K 2.9 (L) 10/15/2022   CL 104 10/15/2022   CO2 17 (L) 10/15/2022   GLUCOSE 92 10/15/2022   BUN 13 10/15/2022   CREATININE 0.76 10/15/2022   CALCIUM 8.7 (L) 10/15/2022   PROT 6.7 10/15/2022   ALBUMIN 3.3 (L) 10/15/2022   AST 19 10/15/2022   ALT 11 10/15/2022   ALKPHOS 55 10/15/2022   BILITOT 0.5 10/15/2022   GFRNONAA >60 10/15/2022   GFRAA 86 03/17/2020    Lab Results  Component Value Date   WBC 5.6 12/10/2022   NEUTROABS 3.1 12/10/2022   HGB 11.2 (L) 12/10/2022   HCT 35.2 (L) 12/10/2022   MCV 84.6 12/10/2022   PLT 279 12/10/2022    Lab Results  Component Value Date   TIBC 367 12/10/2022   TIBC 484.4 (H) 09/05/2022   TIBC 404.6 08/01/2021   FERRITIN 14 12/10/2022   FERRITIN 5.8 (L) 09/05/2022   FERRITIN 24.2 08/01/2021   IRONPCTSAT 12 12/10/2022   IRONPCTSAT 2.9 (L) 09/05/2022   IRONPCTSAT 8.9 (L) 08/01/2021     STUDIES: No results found.  ASSESSMENT AND PLAN:   Sydney Evans is a 72 y.o. female with pmh of iron deficiency anemia, Barrett's esophagus GERD,  hypertension, migraine,  right BKA for PVD in 2023 referred to hematology for management of iron deficiency anemia.  # Iron deficiency anemia -Progressive.  Could not tolerate oral iron due to diarrhea.  Treated with IV Venofer previously in 06/2021.   -On initial visit, hemoglobin 8.9 and ferritin of 5.  Completed IV Venofer 200 mg x 4 doses on October 09, 2022.  Hemoglobin has improved to 10.4 in June.  Repeat CBC and iron panel today.  Will proceed with IV Venofer today.  Patient is comfortable doing Venofer 300 mg dose will schedule for 3 infusions.  -She is having multiple GI symptoms such as diarrhea, nausea, dry heaving, vomiting for past 3 months.  Unable to tolerate food. Endoscopic with Dr. Mia Creek on 12/14/2019 showed 11 mm submucosal nodule in the first portion of duodenum and a papillary lesion 3 mm in the duodenal bulb.  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  Colonoscopy from 2019 was normal.  Occult stool is positive for blood.  She is scheduled for repeat endoscopy and colonoscopy in September 2024.  # History of B12 deficiency -B12 level 195.  Will advise the patient to start B12 supplements.  Orders Placed This Encounter  Procedures   CBC with Differential (Cancer Center Only)   Vitamin B12   Iron and TIBC   Ferritin   CBC with Differential/Platelet   Iron and TIBC   Ferritin   Vitamin B12   RTC in 3 months for MD visit, labs, Venofer  Patient expressed understanding and was in agreement with this plan. She also understands that She can call clinic at any time with any questions, concerns, or complaints.   I spent a total of 30 minutes reviewing chart data, face-to-face evaluation with the patient, counseling and coordination of care as detailed above.  Michaelyn Barter, MD   12/12/2022 11:35 AM

## 2022-12-12 NOTE — ED Provider Notes (Signed)
[facility] Provider Note    Event Date/Time   First MD Initiated Contact with Patient 12/12/22 1812     (approximate)  History   Chief Complaint: Abdominal Pain and Fall  HPI  Sydney Evans is a 72 y.o. female with a past medical history of bipolar, hypertension, CVA, right BKA, presents to the emergency department with a fall as well as abdominal pain.  According to the patient yesterday she had a fall try to get herself into the wheelchair and had additional fall out of her wheelchair.  Patient states she hit her head she does have a resolving hematoma to left forehead she is also complaining of pain in the right hip and right stump/BKA area.  Patient states this morning she developed abdominal pain denies any nausea vomiting diarrhea or urinary symptoms.  Physical Exam   Triage Vital Signs: ED Triage Vitals [12/12/22 1738]  Encounter Vitals Group     BP (!) 167/71     Systolic BP Percentile      Diastolic BP Percentile      Pulse Rate (!) 56     Resp 17     Temp 98.4 F (36.9 C)     Temp Source Oral     SpO2 100 %     Weight 163 lb (73.9 kg)     Height 5\' 1"  (1.549 m)     Head Circumference      Peak Flow      Pain Score 9     Pain Loc      Pain Education      Exclude from Growth Chart     Most recent vital signs: Vitals:   12/12/22 1738  BP: (!) 167/71  Pulse: (!) 56  Resp: 17  Temp: 98.4 F (36.9 C)  SpO2: 100%    General: Awake, no distress.  Mild resolving hematoma to left forehead.  States pain in her right BKA stump and right hip, no signs of injury on exam.  Good range of motion. CV:  Good peripheral perfusion.  Regular rate and rhythm  Resp:  Normal effort.  Equal breath sounds bilaterally.  Abd:  No distention.  Soft, moderate diffuse tenderness to palpation although no rebound or guarding no focal area of tenderness identified.  ED Results / Procedures / Treatments    RADIOLOGY  Reviewed the right knee/tibia x-ray.   Status post right knee replacement with hardware of the femur but no sign of acute injury on my evaluation. Radiology has read the x-ray is negative for acute finding. Pelvis x-ray is negative.  CT scan abdomen and pelvis is negative for acute finding.  CT scan of the head is negative for intracranial abnormality.  MEDICATIONS ORDERED IN ED: Medications  morphine (PF) 4 MG/ML injection 4 mg (has no administration in time range)     IMPRESSION / MDM / ASSESSMENT AND PLAN / ED COURSE  I reviewed the triage vital signs and the nursing notes.  Patient's presentation is most consistent with acute presentation with potential threat to life or bodily function.  Patient presents to the emergency department after a couple falls last night while trying to get back into her wheelchair.  Patient did hit her head and is complaining of a mild headache she has a resolving hematoma to left forehead.  She is complaining some pain to her right BKA area although no findings on physical exam.  She also states since this morning she has been experiencing diffuse  abdominal pain although denies any nausea vomiting diarrhea or urinary symptoms.  Patient's lab work shows a normal CBC with a normal white blood cell count reassuring chemistry normal LFTs normal lipase.  Will obtain a urine sample.  Will obtain CT imaging of the abdomen as well as the head.  Will obtain x-rays of the pelvis and right tibia.  Lab work has resulted showing a reassuring urinalysis no obvious urinary tract infection.  CBC reassuring lipase is normal chemistry is normal imaging is resulted normal as well.  Given the patient's reassuring workup we will discharge patient with PCP follow-up.  Patient agreeable plan of care.  FINAL CLINICAL IMPRESSION(S) / ED DIAGNOSES   Fall Abdominal pain   Note:  This document was prepared using Dragon voice recognition software and may include unintentional dictation errors.   Minna Antis,  MD 12/12/22 2147

## 2022-12-13 ENCOUNTER — Telehealth: Payer: Self-pay

## 2022-12-13 NOTE — Transitions of Care (Post Inpatient/ED Visit) (Signed)
12/13/2022  Name: Sydney Evans MRN: [id number] DOB: 1950-11-13  Today's TOC FU Call Status: Today's TOC FU Call Status:: Successful TOC FU Call Completed TOC FU Call Complete Date: 12/13/22  Transition Care Management Follow-up Telephone Call Date of Discharge: 12/12/22 Discharge Facility: [facility]) Type of Discharge: Emergency Department Reason for ED Visit: Other: (abd pain) How have you been since you were released from the hospital?: Better Any questions or concerns?: No  Items Reviewed: Did you receive and understand the discharge instructions provided?: Yes Medications obtained,verified, and reconciled?: Yes (Medications Reviewed) Any new allergies since your discharge?: No Dietary orders reviewed?: NA Do you have support at home?: Yes  Medications Reviewed Today: Medications Reviewed Today     Reviewed by Barb Merino, LPN (Licensed Practical Nurse) on 12/13/22 at [phone]  Med List Status: <None>   Medication Order Taking? Sig Documenting Provider Last Dose Status Informant  acetaminophen (TYLENOL) 500 MG tablet [id number] No Take 500 mg by mouth every 6 (six) hours as needed. [provider] Taking Active   albuterol (VENTOLIN HFA) 108 (90 Base) MCG/ACT inhaler [id number] No Inhale 2 puffs into the lungs every 4 (four) hours as needed for shortness of breath. Mort Sawyers, FNP Taking Active   amLODipine (NORVASC) 5 MG tablet [id number] No Take 1 tablet (5 mg total) by mouth daily. Mort Sawyers, FNP Taking Active   atorvastatin (LIPITOR) 10 MG tablet [id number]  Take 1 tablet (10 mg total) by mouth daily. Mort Sawyers, FNP  Active   Carboxymethylcellulose Sodium (LUBRICANT EYE DROPS OP) [id number] No Place 1 drop into both eyes daily as needed (Dry eye). Systane [provider] Taking Active Self  citalopram (CELEXA) 40 MG tablet [id number] No Take 1 tablet (40 mg total) by mouth daily. Neysa Hotter, MD Taking Active    diphenoxylate-atropine (LOMOTIL) 2.5-0.025 MG tablet [id number] No Take 1-2 tablets by mouth 4 (four) times daily as needed for diarrhea or loose stools (max 8 tabs per day). Shaune Pollack, MD Taking Active   gabapentin (NEURONTIN) 300 MG capsule [id number] No Take 1 capsule (300 mg total) by mouth 4 (four) times daily. Mort Sawyers, FNP Taking Active   lamoTRIgine (LAMICTAL) 200 MG tablet [id number] No Take 1 tablet (200 mg total) by mouth daily. Neysa Hotter, MD Taking Active   pantoprazole (PROTONIX) 40 MG tablet [id number] No Take 1 tablet (40 mg total) by mouth 2 (two) times daily. Mort Sawyers, FNP Taking Active   Probiotic Product (FORTIFY PROBIOTIC WOMENS PO) [id number] No Take 1 Capful by mouth daily. [provider] Taking Active   propranolol (INDERAL) 20 MG tablet [id number] No Take 1 tablet (20 mg total) by mouth daily as needed. Mort Sawyers, FNP Taking Active   valACYclovir (VALTREX) 500 MG tablet [id number] No Take 1 tablet (500 mg total) by mouth daily. Mort Sawyers, FNP Taking Active             Home Care and Equipment/Supplies: Were Home Health Services Ordered?: NA Any new equipment or medical supplies ordered?: NA  Functional Questionnaire: Do you need assistance with bathing/showering or dressing?: No Do you need assistance with meal preparation?: No Do you need assistance with eating?: No Do you have difficulty maintaining continence: No Do you need assistance with getting out of bed/getting out of a chair/moving?: No Do you have difficulty managing or taking your medications?: No  Follow up appointments reviewed: PCP Follow-up appointment confirmed?: Yes Date of PCP follow-up appointment?: 12/24/22 Follow-up Provider: Mort Sawyers Specialist  Hospital Follow-up appointment confirmed?: NA Do you need transportation to your follow-up appointment?: No Do you understand care options if your condition(s) worsen?: Yes-patient verbalized  understanding    SIGNATURE Elisha Ponder LPN [facility] AWV Team Direct dial:  [phone]

## 2022-12-16 ENCOUNTER — Other Ambulatory Visit: Payer: Self-pay | Admitting: Student

## 2022-12-16 DIAGNOSIS — M79601 Pain in right arm: Secondary | ICD-10-CM

## 2022-12-16 DIAGNOSIS — M5412 Radiculopathy, cervical region: Secondary | ICD-10-CM

## 2022-12-18 ENCOUNTER — Inpatient Hospital Stay: Payer: 59

## 2022-12-18 VITALS — BP 155/57 | HR 48 | Temp 97.0°F

## 2022-12-18 DIAGNOSIS — D509 Iron deficiency anemia, unspecified: Secondary | ICD-10-CM | POA: Diagnosis not present

## 2022-12-18 MED ORDER — SODIUM CHLORIDE 0.9 % IV SOLN
300.0000 mg | Freq: Once | INTRAVENOUS | Status: AC
  Administered 2022-12-18: 300 mg via INTRAVENOUS
  Filled 2022-12-18: qty 300

## 2022-12-18 MED ORDER — SODIUM CHLORIDE 0.9 % IV SOLN
Freq: Once | INTRAVENOUS | Status: AC
  Filled 2022-12-18: qty 250

## 2022-12-24 ENCOUNTER — Encounter: Payer: Self-pay | Admitting: Family

## 2022-12-24 ENCOUNTER — Ambulatory Visit: Payer: 59 | Admitting: Family

## 2022-12-24 VITALS — BP 190/70 | HR 51 | Temp 97.7°F | Ht 61.0 in | Wt 161.0 lb

## 2022-12-24 DIAGNOSIS — I70221 Atherosclerosis of native arteries of extremities with rest pain, right leg: Secondary | ICD-10-CM | POA: Diagnosis not present

## 2022-12-24 DIAGNOSIS — Z741 Need for assistance with personal care: Secondary | ICD-10-CM

## 2022-12-24 DIAGNOSIS — I10 Essential (primary) hypertension: Secondary | ICD-10-CM | POA: Diagnosis not present

## 2022-12-24 DIAGNOSIS — Z89511 Acquired absence of right leg below knee: Secondary | ICD-10-CM | POA: Diagnosis not present

## 2022-12-24 DIAGNOSIS — R531 Weakness: Secondary | ICD-10-CM

## 2022-12-24 DIAGNOSIS — Z789 Other specified health status: Secondary | ICD-10-CM

## 2022-12-24 DIAGNOSIS — Z736 Limitation of activities due to disability: Secondary | ICD-10-CM | POA: Diagnosis not present

## 2022-12-24 NOTE — Patient Instructions (Addendum)
You need to get a refill on your amlodipine 5 mg once daily.    Regards,   Mort Sawyers FNP-C

## 2022-12-24 NOTE — Progress Notes (Addendum)
Established Patient Office Visit  Subjective:      CC:  Chief Complaint  Patient presents with  . Hospitalization Follow-up    12/12/2022 (3 hours) [facility] at [facility]     . Abdominal Pain    HPI: Sydney Evans is a 72 y.o. female presenting on 12/24/2022 for Hospitalization Follow-up (12/12/2022 (3 hours)/[place] [facility] at [facility]) and Abdominal Pain . Went to ER 8/8 after a fall where she fell out of her wheelchair and hit her head, with a bruise on her forehead. She does state she had also developed abdominal pain with n/v/d that morning.   XR: right knee tibia , s/p right knee replacement negative for acute finding. Does have appt as well with surgeon tomorrow that performed her BKA. CT scan abdomen pelvis as well as CT head, and negative for intracranial abn.  Urine was negative for UTI labs unremarkable.   Anemia is improving, seeing Dr. Alena Bills for IV infusions. Also taking otc b12 supplementation.   Vascular surgeon, seeing Dr. Lucretia Roers for s/p RSFA stenting and angioplasty. Recent imaging with stenosis less than 50% left carotid artery. Did restart atorvastatin 40 mg once daily, still taking daily baby aspirin.   New complaints: She is concerned because medicare has advised her that her home care coverage is no longer covered. She is unable to bath herself , and this is chronic due to her right BKA and weakness. She has not been able to shower or groom for the last two weeks because of this. She is able to transfer from bed to chair, and around the house. She is also reliant on transportation from others to get out of the home for appointments and or other care. She states that pt and ot were still helpful for her.     Wt Readings from Last 3 Encounters:  12/24/22 161 lb (73 kg)  12/12/22 163 lb (73.9 kg)  12/10/22 165 lb (74.8 kg)   Temp Readings from Last 3 Encounters:  12/24/22 97.7 F (36.5 C)   12/18/22 (!) 97 F (36.1 C)  12/12/22 98.5 F (36.9 C) (Oral)   BP Readings from Last 3 Encounters:  12/25/22 (!) 181/70  12/24/22 (!) 190/70  12/18/22 (!) 155/57   Pulse Readings from Last 3 Encounters:  12/25/22 (!) 48  12/24/22 (!) 51  12/18/22 (!) 48       Social history:  Relevant past medical, surgical, family and social history reviewed and updated as indicated. Interim medical history since our last visit reviewed.  Allergies and medications reviewed and updated.  DATA REVIEWED: CHART IN EPIC     ROS: Negative unless specifically indicated above in HPI.    Current Outpatient Medications:  .  acetaminophen (TYLENOL) 500 MG tablet, Take 500 mg by mouth every 6 (six) hours as needed., Disp: , Rfl:  .  albuterol (VENTOLIN HFA) 108 (90 Base) MCG/ACT inhaler, Inhale 2 puffs into the lungs every 4 (four) hours as needed for shortness of breath., Disp: 1 each, Rfl: 0 .  amLODipine (NORVASC) 5 MG tablet, Take 1 tablet (5 mg total) by mouth daily., Disp: 90 tablet, Rfl: 3 .  atorvastatin (LIPITOR) 10 MG tablet, Take 1 tablet (10 mg total) by mouth daily., Disp: 90 tablet, Rfl: 3 .  Carboxymethylcellulose Sodium (LUBRICANT EYE DROPS OP), Place 1 drop into both eyes daily as needed (Dry eye). Systane, Disp: , Rfl:  .  citalopram (CELEXA) 40 MG tablet, Take 1 tablet (40  mg total) by mouth daily., Disp: 90 tablet, Rfl: 1 .  diphenoxylate-atropine (LOMOTIL) 2.5-0.025 MG tablet, Take 1-2 tablets by mouth 4 (four) times daily as needed for diarrhea or loose stools (max 8 tabs per day)., Disp: 30 tablet, Rfl: 0 .  gabapentin (NEURONTIN) 300 MG capsule, Take 1 capsule (300 mg total) by mouth 4 (four) times daily., Disp: 360 capsule, Rfl: 3 .  lamoTRIgine (LAMICTAL) 200 MG tablet, Take 1 tablet (200 mg total) by mouth daily., Disp: 90 tablet, Rfl: 0 .  pantoprazole (PROTONIX) 40 MG tablet, Take 1 tablet (40 mg total) by mouth 2 (two) times daily., Disp: 180 tablet, Rfl: 1 .   Probiotic Product (FORTIFY PROBIOTIC WOMENS PO), Take 1 Capful by mouth daily., Disp: , Rfl:  .  propranolol (INDERAL) 20 MG tablet, Take 1 tablet (20 mg total) by mouth daily as needed., Disp: 30 tablet, Rfl: 0 .  valACYclovir (VALTREX) 500 MG tablet, Take 1 tablet (500 mg total) by mouth daily., Disp: 90 tablet, Rfl: 0      Objective:    BP (!) 190/70   Pulse (!) 51   Temp 97.7 F (36.5 C)   Ht 5\' 1"  (1.549 m)   Wt 161 lb (73 kg)   SpO2 97%   BMI 30.42 kg/m   Wt Readings from Last 3 Encounters:  12/24/22 161 lb (73 kg)  12/12/22 163 lb (73.9 kg)  12/10/22 165 lb (74.8 kg)    Physical Exam Vitals reviewed.  Constitutional:      General: She is not in acute distress.    Appearance: Normal appearance. She is normal weight. She is not ill-appearing, toxic-appearing or diaphoretic.  HENT:     Head: Normocephalic.  Cardiovascular:     Rate and Rhythm: Normal rate.  Pulmonary:     Effort: Pulmonary effort is normal.  Musculoskeletal:        General: Normal range of motion.  Neurological:     General: No focal deficit present.     Mental Status: She is alert and oriented to person, place, and time. Mental status is at baseline.  Psychiatric:        Mood and Affect: Mood normal.        Behavior: Behavior normal.        Thought Content: Thought content normal.        Judgment: Judgment normal.          Assessment & Plan:  Critical limb ischemia of right lower extremity (HCC) -     Ambulatory referral to Home Health  Essential hypertension Assessment & Plan: Non compliance Discussed need for pt to pick up amlodipine 5 mg today and get restarted Monitor blood pressure goal <130/90 Pt instructed to let me know if no improvement in next few days   Right below-knee amputee [facility]) -     Ambulatory referral to Home Health  Limitation of activity due to disability Assessment & Plan: Pt limited in IADLs due to right BKA requiring help at home, she lives alone. Unable  to bath/groom without assistance and needs help with transporation and meal prep.    Orders: -     Ambulatory referral to Home Health  Activity of daily living alteration -     Ambulatory referral to Home Health  Need for assistance with personal care -     Ambulatory referral to Home Health  Weakness -     Ambulatory referral to Home Health     Return in about  3 months (around 03/26/2023) for f/u blood pressure.  Mort Sawyers, MSN, APRN, FNP-C [facility]

## 2022-12-25 ENCOUNTER — Inpatient Hospital Stay: Payer: 59

## 2022-12-25 VITALS — BP 181/70 | HR 48 | Resp 18

## 2022-12-25 DIAGNOSIS — D509 Iron deficiency anemia, unspecified: Secondary | ICD-10-CM | POA: Diagnosis not present

## 2022-12-25 MED ORDER — SODIUM CHLORIDE 0.9 % IV SOLN
Freq: Once | INTRAVENOUS | Status: AC
  Filled 2022-12-25: qty 250

## 2022-12-25 MED ORDER — SODIUM CHLORIDE 0.9 % IV SOLN
300.0000 mg | Freq: Once | INTRAVENOUS | Status: AC
  Administered 2022-12-25: 300 mg via INTRAVENOUS
  Filled 2022-12-25: qty 200

## 2022-12-31 NOTE — Assessment & Plan Note (Signed)
Non compliance Discussed need for pt to pick up amlodipine 5 mg today and get restarted Monitor blood pressure goal <130/90 Pt instructed to let me know if no improvement in next few days

## 2022-12-31 NOTE — Assessment & Plan Note (Signed)
Pt limited in IADLs due to right BKA requiring help at home, she lives alone. Unable to bath/groom without assistance and needs help with transporation and meal prep.

## 2023-01-01 ENCOUNTER — Inpatient Hospital Stay: Payer: 59

## 2023-01-01 VITALS — BP 130/64 | HR 45 | Temp 97.5°F | Resp 19

## 2023-01-01 DIAGNOSIS — D509 Iron deficiency anemia, unspecified: Secondary | ICD-10-CM | POA: Diagnosis not present

## 2023-01-01 MED ORDER — SODIUM CHLORIDE 0.9 % IV SOLN
Freq: Once | INTRAVENOUS | Status: AC
  Filled 2023-01-01: qty 250

## 2023-01-01 MED ORDER — SODIUM CHLORIDE 0.9 % IV SOLN
300.0000 mg | Freq: Once | INTRAVENOUS | Status: AC
  Administered 2023-01-01: 300 mg via INTRAVENOUS
  Filled 2023-01-01: qty 300

## 2023-01-08 ENCOUNTER — Inpatient Hospital Stay: Payer: 59

## 2023-01-09 NOTE — Discharge Instructions (Signed)

## 2023-01-10 ENCOUNTER — Ambulatory Visit
Admission: RE | Admit: 2023-01-10 | Discharge: 2023-01-10 | Disposition: A | Discharge status: Home / Self Care | Payer: 59 | Source: Ambulatory Visit | Attending: Student | Admitting: Student

## 2023-01-10 DIAGNOSIS — M79601 Pain in right arm: Secondary | ICD-10-CM

## 2023-01-10 DIAGNOSIS — M5412 Radiculopathy, cervical region: Secondary | ICD-10-CM

## 2023-01-10 MED ORDER — IOPAMIDOL (ISOVUE-M 300) INJECTION 61%
1.0000 mL | Freq: Once | INTRAMUSCULAR | Status: AC | PRN
  Administered 2023-01-10: 1 mL via EPIDURAL

## 2023-01-10 MED ORDER — TRIAMCINOLONE ACETONIDE 40 MG/ML IJ SUSP (RADIOLOGY)
60.0000 mg | Freq: Once | INTRAMUSCULAR | Status: AC
  Administered 2023-01-10: 60 mg via EPIDURAL
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
## 2023-01-20 ENCOUNTER — Ambulatory Visit (INDEPENDENT_AMBULATORY_CARE_PROVIDER_SITE_OTHER): Payer: 59 | Admitting: Psychiatry

## 2023-01-20 ENCOUNTER — Encounter: Payer: Self-pay | Admitting: Psychiatry

## 2023-01-20 VITALS — BP 147/52 | HR 65 | Temp 97.3°F

## 2023-01-20 DIAGNOSIS — F319 Bipolar disorder, unspecified: Secondary | ICD-10-CM

## 2023-01-20 DIAGNOSIS — F4310 Post-traumatic stress disorder, unspecified: Secondary | ICD-10-CM

## 2023-01-20 NOTE — Patient Instructions (Signed)
Continue citalopram 40 mg daily  Continue lamotrigine 200 mg daily   Next appointment: 12/2 at 1:30

## 2023-01-24 ENCOUNTER — Encounter: Payer: Self-pay | Admitting: *Deleted

## 2023-01-30 ENCOUNTER — Encounter (INDEPENDENT_AMBULATORY_CARE_PROVIDER_SITE_OTHER): Payer: Medicare Other

## 2023-01-30 ENCOUNTER — Ambulatory Visit (INDEPENDENT_AMBULATORY_CARE_PROVIDER_SITE_OTHER): Payer: Medicare Other | Admitting: Vascular Surgery

## 2023-01-31 ENCOUNTER — Ambulatory Visit: Payer: 59 | Admitting: Certified Registered"

## 2023-01-31 ENCOUNTER — Encounter: Payer: Self-pay | Admitting: *Deleted

## 2023-01-31 ENCOUNTER — Ambulatory Visit
Admission: RE | Admit: 2023-01-31 | Discharge: 2023-01-31 | Disposition: A | Discharge status: Home / Self Care | Payer: 59 | Attending: Gastroenterology | Admitting: Gastroenterology

## 2023-01-31 ENCOUNTER — Encounter
Admission: RE | Disposition: A | Discharge status: Home / Self Care | Payer: Self-pay | Source: Home / Self Care | Attending: Gastroenterology

## 2023-01-31 DIAGNOSIS — Z9049 Acquired absence of other specified parts of digestive tract: Secondary | ICD-10-CM | POA: Insufficient documentation

## 2023-01-31 DIAGNOSIS — I739 Peripheral vascular disease, unspecified: Secondary | ICD-10-CM | POA: Insufficient documentation

## 2023-01-31 DIAGNOSIS — D509 Iron deficiency anemia, unspecified: Secondary | ICD-10-CM | POA: Diagnosis not present

## 2023-01-31 DIAGNOSIS — K640 First degree hemorrhoids: Secondary | ICD-10-CM | POA: Insufficient documentation

## 2023-01-31 DIAGNOSIS — I10 Essential (primary) hypertension: Secondary | ICD-10-CM | POA: Insufficient documentation

## 2023-01-31 DIAGNOSIS — R1084 Generalized abdominal pain: Secondary | ICD-10-CM | POA: Diagnosis present

## 2023-01-31 DIAGNOSIS — Z8719 Personal history of other diseases of the digestive system: Secondary | ICD-10-CM | POA: Diagnosis not present

## 2023-01-31 DIAGNOSIS — Z8673 Personal history of transient ischemic attack (TIA), and cerebral infarction without residual deficits: Secondary | ICD-10-CM | POA: Insufficient documentation

## 2023-01-31 DIAGNOSIS — K5730 Diverticulosis of large intestine without perforation or abscess without bleeding: Secondary | ICD-10-CM | POA: Diagnosis not present

## 2023-01-31 DIAGNOSIS — D122 Benign neoplasm of ascending colon: Secondary | ICD-10-CM | POA: Insufficient documentation

## 2023-01-31 DIAGNOSIS — K219 Gastro-esophageal reflux disease without esophagitis: Secondary | ICD-10-CM | POA: Diagnosis not present

## 2023-01-31 DIAGNOSIS — D175 Benign lipomatous neoplasm of intra-abdominal organs: Secondary | ICD-10-CM | POA: Insufficient documentation

## 2023-01-31 HISTORY — PX: ESOPHAGOGASTRODUODENOSCOPY (EGD) WITH PROPOFOL: SHX5813

## 2023-01-31 HISTORY — PX: COLONOSCOPY: SHX5424

## 2023-01-31 HISTORY — PX: BIOPSY: SHX5522

## 2023-01-31 HISTORY — PX: POLYPECTOMY: SHX5525

## 2023-01-31 SURGERY — COLONOSCOPY
Anesthesia: General

## 2023-01-31 MED ORDER — SODIUM CHLORIDE 0.9 % IV SOLN
INTRAVENOUS | Status: DC
  Administered 2023-01-31: 1000 mL via INTRAVENOUS

## 2023-01-31 MED ORDER — PROPOFOL 500 MG/50ML IV EMUL
INTRAVENOUS | Status: DC | PRN
  Administered 2023-01-31: 150 ug/kg/min via INTRAVENOUS

## 2023-01-31 MED ORDER — PROPOFOL 10 MG/ML IV BOLUS
INTRAVENOUS | Status: DC | PRN
Start: 2023-01-31 — End: 2023-01-31
  Administered 2023-01-31: 60 mg via INTRAVENOUS

## 2023-01-31 MED ORDER — LIDOCAINE HCL (CARDIAC) PF 100 MG/5ML IV SOSY
PREFILLED_SYRINGE | INTRAVENOUS | Status: DC | PRN
  Administered 2023-01-31: 40 mg via INTRAVENOUS

## 2023-01-31 MED ORDER — STERILE WATER FOR IRRIGATION IR SOLN
Status: DC | PRN
  Administered 2023-01-31: 120 mL

## 2023-01-31 NOTE — Op Note (Signed)
[facility] Gastroenterology Patient Name: Sydney Evans Procedure Date: 01/31/2023 8:30 AM MRN: [id number] Account #: [id number] Date of Birth: [birth year] Admit Type: Outpatient Age: 72 Room: [facility] ENDO ROOM 3 Gender: Female Note Status: Supervisor Override Instrument Name: Peds Colonoscope [id number] Procedure:             Colonoscopy Indications:           Generalized abdominal pain, Iron deficiency anemia Providers:             Eather Colas MD, MD Referring MD:          Mort Sawyers (Referring MD) Medicines:             Monitored Anesthesia Care Complications:         No immediate complications. Estimated blood loss:                         Minimal. Procedure:             Pre-Anesthesia Assessment:                        - Prior to the procedure, a History and Physical was                         performed, and patient medications and allergies were                         reviewed. The patient is competent. The risks and                         benefits of the procedure and the sedation options and                         risks were discussed with the patient. All questions                         were answered and informed consent was obtained.                         Patient identification and proposed procedure were                         verified by the physician, the nurse, the                         anesthesiologist, the anesthetist and the technician                         in the endoscopy suite. Mental Status Examination:                         alert and oriented. Airway Examination: normal                         oropharyngeal airway and neck mobility. Respiratory                         Examination: clear to auscultation. CV Examination:  normal. Prophylactic Antibiotics: The patient does not                         require prophylactic antibiotics. Prior                         Anticoagulants: The patient has taken no  anticoagulant                         or antiplatelet agents except for aspirin. ASA Grade                         Assessment: III - A patient with severe systemic                         disease. After reviewing the risks and benefits, the                         patient was deemed in satisfactory condition to                         undergo the procedure. The anesthesia plan was to use                         monitored anesthesia care (MAC). Immediately prior to                         administration of medications, the patient was                         re-assessed for adequacy to receive sedatives. The                         heart rate, respiratory rate, oxygen saturations,                         blood pressure, adequacy of pulmonary ventilation, and                         response to care were monitored throughout the                         procedure. The physical status of the patient was                         re-assessed after the procedure.                        After obtaining informed consent, the colonoscope was                         passed under direct vision. Throughout the procedure,                         the patient's blood pressure, pulse, and oxygen                         saturations were monitored continuously. The  Colonoscope was introduced through the anus and                         advanced to the the terminal ileum. The colonoscopy                         was performed without difficulty. The patient                         tolerated the procedure well. The quality of the bowel                         preparation was fair. The terminal ileum, ileocecal                         valve, appendiceal orifice, and rectum were                         photographed. Findings:      The perianal and digital rectal examinations were normal.      The terminal ileum appeared normal.      A 1 mm polyp was found in the ascending colon. The polyp  was sessile.       The polyp was removed with a cold biopsy forceps. Resection and       retrieval were complete. Estimated blood loss was minimal.      Normal mucosa was found in the entire colon. Biopsies for histology were       taken with a cold forceps from the entire colon for evaluation of       microscopic colitis. Estimated blood loss was minimal.      Multiple small-mouthed diverticula were found in the sigmoid colon and       descending colon.      Internal hemorrhoids were found during retroflexion. The hemorrhoids       were Grade I (internal hemorrhoids that do not prolapse).      The exam was otherwise without abnormality on direct and retroflexion       views. Impression:            - Preparation of the colon was fair.                        - The examined portion of the ileum was normal.                        - One 1 mm polyp in the ascending colon, removed with                         a cold biopsy forceps. Resected and retrieved.                        - Normal mucosa in the entire examined colon. Biopsied.                        - Diverticulosis in the sigmoid colon and in the                         descending colon.                        -  Internal hemorrhoids.                        - The examination was otherwise normal on direct and                         retroflexion views. Recommendation:        - Discharge patient to home.                        - Resume previous diet.                        - Continue present medications.                        - Await pathology results.                        - Repeat colonoscopy in 3 years because the bowel                         preparation was suboptimal.                        - Return to referring physician as previously                         scheduled. Procedure Code(s):     --- Professional ---                        [phone], Colonoscopy, flexible; with biopsy, single or                          multiple Diagnosis Code(s):     --- Professional ---                        K64.0, First degree hemorrhoids                        D12.2, Benign neoplasm of ascending colon                        D50.9, Iron deficiency anemia, unspecified                        K57.30, Diverticulosis of large intestine without                         perforation or abscess without bleeding CPT copyright 2022 American [employer] Association. All rights reserved. The codes documented in this report are preliminary and upon coder review may  be revised to meet current compliance requirements. Eather Colas MD, MD 01/31/2023 9:17:09 AM Number of Addenda: 0 Note Initiated On: 01/31/2023 8:30 AM Scope Withdrawal Time: 0 hours 9 minutes 14 seconds  Total Procedure Duration: 0 hours 14 minutes 18 seconds  Estimated Blood Loss:  Estimated blood loss was minimal.      [facility]

## 2023-01-31 NOTE — H&P (Signed)
Outpatient short stay form Pre-procedure 01/31/2023  Regis Bill, MD  Primary Physician: Mort Sawyers, FNP  Reason for visit:  IDA/Diarrhea  History of present illness:    72 y/o lady with history of hypertension, PVD s/p lower extremity amputation, and bipolar here for EGD for IDA and colonoscopy for IDA and diarrhea. Last colonoscopy in 2019 unremarkable. History of atrophic gastritis. Only takes baby aspirin. History of hysterectomy. No family history of GI malignancies.    Current Facility-Administered Medications:    0.9 %  sodium chloride infusion, , Intravenous, Continuous, Zinia Innocent, Rossie Muskrat, MD, Last Rate: 20 mL/hr at 01/31/23 0825, Continued from Pre-op at 01/31/23 0825  Medications Prior to Admission  Medication Sig Dispense Refill Last Dose   amLODipine (NORVASC) 5 MG tablet Take 1 tablet (5 mg total) by mouth daily. 90 tablet 3 01/31/2023 at 0530   atorvastatin (LIPITOR) 10 MG tablet Take 1 tablet (10 mg total) by mouth daily. 90 tablet 3 Past Week   Carboxymethylcellulose Sodium (LUBRICANT EYE DROPS OP) Place 1 drop into both eyes daily as needed (Dry eye). Systane   01/30/2023   citalopram (CELEXA) 40 MG tablet Take 1 tablet (40 mg total) by mouth daily. 90 tablet 1 01/30/2023   diphenoxylate-atropine (LOMOTIL) 2.5-0.025 MG tablet Take 1-2 tablets by mouth 4 (four) times daily as needed for diarrhea or loose stools (max 8 tabs per day). 30 tablet 0 Past Week   gabapentin (NEURONTIN) 300 MG capsule Take 1 capsule (300 mg total) by mouth 4 (four) times daily. 360 capsule 3 01/30/2023   pantoprazole (PROTONIX) 40 MG tablet Take 1 tablet (40 mg total) by mouth 2 (two) times daily. 180 tablet 1 01/30/2023   Probiotic Product (FORTIFY PROBIOTIC WOMENS PO) Take 1 Capful by mouth daily.   01/30/2023   propranolol (INDERAL) 20 MG tablet Take 1 tablet (20 mg total) by mouth daily as needed. 30 tablet 0 01/30/2023   valACYclovir (VALTREX) 500 MG tablet Take 1 tablet (500 mg total)  by mouth daily. 90 tablet 0 01/30/2023   acetaminophen (TYLENOL) 500 MG tablet Take 500 mg by mouth every 6 (six) hours as needed.      albuterol (VENTOLIN HFA) 108 (90 Base) MCG/ACT inhaler Inhale 2 puffs into the lungs every 4 (four) hours as needed for shortness of breath. 1 each 0  at prn   lamoTRIgine (LAMICTAL) 200 MG tablet Take 1 tablet (200 mg total) by mouth daily. 90 tablet 0      Allergies  Allergen Reactions   Hydroxyzine Hives and Rash   Bactrim [Sulfamethoxazole-Trimethoprim] Nausea And Vomiting   Dilaudid [Hydromorphone] Itching   Ketorolac Rash     May take with benadryl   Toradol [Ketorolac Tromethamine] Rash    May take with benadryl   Tramadol Hives   Zofran [Ondansetron] Other (See Comments)    Broke mouth out     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   Anemia    Anxiety    Arthritis    Barrett's esophagus    Bipolar disorder (HCC)    Blocked artery    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   Blood clot in vein    Cancer (HCC)    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   Carotid arterial disease (HCC)    Contracture of joint of upper arm    Critical limb ischemia of right lower extremity (HCC) 06/21/2021   Depression    Dislocation of right shoulder joint 10/06/2019   Disorder  of bone and articular cartilage 09/29/2014   Elevated lipids    GERD (gastroesophageal reflux disease)    Hypertension    Migraine    Osteoporosis    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    Physical Exam  Gen: Alert, oriented. Appears stated age.  HEENT: PERRLA. Lungs: No respiratory distress CV:  RRR Abd: soft, benign, no masses Ext: No edema    Planned procedures: Proceed with EGD/colonoscopy. The patient understands the nature of the planned procedure, indications, risks, alternatives and potential complications including but not limited to bleeding, infection, perforation, damage to internal organs and possible oversedation/side effects from anesthesia. The patient agrees and gives consent to proceed.  Please refer to procedure notes for findings, recommendations and patient disposition/instructions.     Regis Bill, MD [facility] Gastroenterology

## 2023-01-31 NOTE — Anesthesia Preprocedure Evaluation (Signed)
Anesthesia Evaluation  Patient identified by MRN, date of birth, ID band Patient awake    Reviewed: Allergy & Precautions, H&P , NPO status , Patient's Chart, lab work & pertinent test results  History of Anesthesia Complications Negative for: history of anesthetic complications  Airway Mallampati: II  TM Distance: >3 FB     Dental  (+) Upper Dentures, Lower Dentures   Pulmonary neg sleep apnea, neg COPD, Current Smoker and Patient abstained from smoking.   breath sounds clear to auscultation       Cardiovascular hypertension, (-) angina (-) Past MI and (-) Cardiac Stents (-) dysrhythmias  Rhythm:regular Rate:Normal  Carotid stenosis H/o non-sustained VTach   Neuro/Psych  Headaches PSYCHIATRIC DISORDERS Anxiety Depression Bipolar Disorder   TIACVA    GI/Hepatic Neg liver ROS,GERD  ,,Barrett's esophagus   Endo/Other  negative endocrine ROS    Renal/GU      Musculoskeletal   Abdominal   Peds  Hematology  (+) Blood dyscrasia, anemia   Anesthesia Other Findings Past Medical History: No date: Anemia No date: Anxiety No date: Arthritis No date: Barrett's esophagus No date: Bipolar disorder (HCC) No date: Blocked artery     Comment:  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 No date: Blood clot in vein No date: Cancer [facility])     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 No date: Carotid arterial disease (HCC) 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 No date: Depression No date: Elevated lipids No date: GERD (gastroesophageal reflux disease) No date: Hypertension No date: Migraine No date: Osteoporosis No date: Poor balance No date: Sinus congestion No date: Stroke [facility])     Comment:  x 2 No date: TIA (transient ischemic attack) No date: Vertigo  Past Surgical History: No date: ABDOMINAL HYSTERECTOMY No date: BACK SURGERY No date: BREAST BIOPSY; Left No date: CARPAL TUNNEL RELEASE; Bilateral No date: CATARACT EXTRACTION W/ INTRAOCULAR LENS   IMPLANT, BILATERAL;  Bilateral No date: CHOLECYSTECTOMY 04/09/2018: COLONOSCOPY WITH PROPOFOL; N/A     Comment:  Procedure: COLONOSCOPY WITH PROPOFOL;  Surgeon: Wyline Mood, MD;  Location[facility] ENDOSCOPY;  Service:               Gastroenterology;  Laterality: N/A; No date: crystal cyst removed on left foot 04/09/2018: ESOPHAGOGASTRODUODENOSCOPY (EGD) WITH PROPOFOL; N/A     Comment:  Procedure: ESOPHAGOGASTRODUODENOSCOPY (EGD) WITH               PROPOFOL;  Surgeon: Wyline Mood, MD;  Location: [facility]               ENDOSCOPY;  Service: Gastroenterology;  Laterality: N/A; No date: EYE SURGERY No date: femoral fx No date: FOOT SURGERY 06/16/2018: GIVENS CAPSULE STUDY; N/A     Comment:  Procedure: GIVENS CAPSULE STUDY;  Surgeon: Wyline Mood,               MD;  Location: [facility] ENDOSCOPY;  Service:               Gastroenterology;  Laterality: N/A; No date: JOINT REPLACEMENT No date: KNEE SURGERY 10/07/2019: ORIF PERIPROSTHETIC FRACTURE; Right     Comment:  Procedure: OPEN REDUCTION INTERNAL FIXATION (ORIF)               PERIPROSTHETIC FRACTURE;  Surgeon: Christena Flake, MD;                Location: [facility] ORS;  Service: Orthopedics;  Laterality:  Right; No date: TONSILLECTOMY No date: TOTAL KNEE ARTHROPLASTY; Bilateral 08/01/2016: TOTAL SHOULDER ARTHROPLASTY; Left     Comment:  Procedure: TOTAL SHOULDER ARTHROPLASTY;  Surgeon: Christena Flake, MD;  Location: [facility] ORS;  Service: Orthopedics;                Laterality: Left; 09/30/2019: TOTAL SHOULDER ARTHROPLASTY; Right     Comment:  Procedure: TOTAL SHOULDER ARTHROPLASTY;  Surgeon: Christena Flake, MD;  Location: [facility] ORS;  Service: Orthopedics;                Laterality: Right;     Reproductive/Obstetrics negative OB ROS                             Anesthesia Physical Anesthesia Plan  ASA: 3  Anesthesia Plan: General   Post-op Pain Management: Minimal or no pain  anticipated   Induction: Intravenous  PONV Risk Score and Plan: 3 and Propofol infusion, TIVA and Ondansetron  Airway Management Planned: Nasal Cannula  Additional Equipment: None  Intra-op Plan:   Post-operative Plan:   Informed Consent: I have reviewed the patients History and Physical, chart, labs and discussed the procedure including the risks, benefits and alternatives for the proposed anesthesia with the patient or authorized representative who has indicated his/her understanding and acceptance.     Dental advisory given  Plan Discussed with: CRNA and Surgeon  Anesthesia Plan Comments: (Discussed risks of anesthesia with patient, including possibility of difficulty with spontaneous ventilation under anesthesia necessitating airway intervention, PONV, and rare risks such as cardiac or respiratory or neurological events, and allergic reactions. Discussed the role of CRNA in patient's perioperative care. Patient understands.)       Anesthesia Quick Evaluation

## 2023-01-31 NOTE — Anesthesia Postprocedure Evaluation (Signed)
Anesthesia Post Note  Patient: Sydney Evans  Procedure(s) Performed: COLONOSCOPY ESOPHAGOGASTRODUODENOSCOPY (EGD) WITH PROPOFOL BIOPSY POLYPECTOMY  Patient location during evaluation: Endoscopy Anesthesia Type: General Level of consciousness: awake and alert Pain management: pain level controlled Vital Signs Assessment: post-procedure vital signs reviewed and stable Respiratory status: spontaneous breathing, nonlabored ventilation, respiratory function stable and patient connected to nasal cannula oxygen Cardiovascular status: blood pressure returned to baseline and stable Postop Assessment: no apparent nausea or vomiting Anesthetic complications: no  No notable events documented.   Last Vitals:  Vitals:   01/31/23 0920 01/31/23 0924  BP: 113/69 (!) 150/67  Pulse: (!) 44 (!) 46  Resp: 15 15  Temp:    SpO2: 100% 100%    Last Pain:  Vitals:   01/31/23 0924  TempSrc:   PainSc: 0-No pain                 Stephanie Coup

## 2023-01-31 NOTE — Op Note (Signed)
[facility] Gastroenterology Patient Name: Sydney Evans Procedure Date: 01/31/2023 8:32 AM MRN: [id number] Account #: [id number] Date of Birth: [birth year] Admit Type: Outpatient Age: 72 Room: [facility] ENDO ROOM 3 Gender: Female Note Status: Finalized Instrument Name: Laurette Schimke [id number] Procedure:             Upper GI endoscopy Indications:           Iron deficiency anemia, Suspected gastro-esophageal                         reflux disease Providers:             Eather Colas MD, MD Referring MD:          Mort Sawyers (Referring MD) Medicines:             Monitored Anesthesia Care Complications:         No immediate complications. Estimated blood loss:                         Minimal. Procedure:             Pre-Anesthesia Assessment:                        - Prior to the procedure, a History and Physical was                         performed, and patient medications and allergies were                         reviewed. The patient is competent. The risks and                         benefits of the procedure and the sedation options and                         risks were discussed with the patient. All questions                         were answered and informed consent was obtained.                         Patient identification and proposed procedure were                         verified by the physician, the nurse, the                         anesthesiologist, the anesthetist and the technician                         in the endoscopy suite. Mental Status Examination:                         alert and oriented. Airway Examination: normal                         oropharyngeal airway and neck mobility. Respiratory  Examination: clear to auscultation. CV Examination:                         normal. Prophylactic Antibiotics: The patient does not                         require prophylactic antibiotics. Prior                          Anticoagulants: The patient has taken no anticoagulant                         or antiplatelet agents except for aspirin. ASA Grade                         Assessment: III - A patient with severe systemic                         disease. After reviewing the risks and benefits, the                         patient was deemed in satisfactory condition to                         undergo the procedure. The anesthesia plan was to use                         monitored anesthesia care (MAC). Immediately prior to                         administration of medications, the patient was                         re-assessed for adequacy to receive sedatives. The                         heart rate, respiratory rate, oxygen saturations,                         blood pressure, adequacy of pulmonary ventilation, and                         response to care were monitored throughout the                         procedure. The physical status of the patient was                         re-assessed after the procedure.                        After obtaining informed consent, the endoscope was                         passed under direct vision. Throughout the procedure,                         the patient's blood pressure, pulse, and oxygen  saturations were monitored continuously. The Endoscope                         was introduced through the mouth, and advanced to the                         second part of duodenum. The upper GI endoscopy was                         accomplished without difficulty. The patient tolerated                         the procedure well. Findings:      The examined esophagus was normal.      The entire examined stomach was normal. Biopsies were taken with a cold       forceps for histology. Estimated blood loss was minimal.      There was a small lipoma in the first portion of the duodenum.      The exam of the duodenum was otherwise normal. Impression:             - Normal esophagus.                        - Normal stomach. Biopsied.                        - Duodenal lipoma. Recommendation:        - Discharge patient to home.                        - Resume previous diet.                        - Continue present medications.                        - Await pathology results.                        - Return to referring physician as previously                         scheduled. Procedure Code(s):     --- Professional ---                        [phone], Esophagogastroduodenoscopy, flexible,                         transoral; with biopsy, single or multiple Diagnosis Code(s):     --- Professional ---                        D17.5, Benign lipomatous neoplasm of intra-abdominal                         organs                        D50.9, Iron deficiency anemia, unspecified CPT copyright 2022 American Medical Association. All rights reserved. The codes documented in this report are preliminary and upon coder review may  be revised  to meet current compliance requirements. Eather Colas MD, MD 01/31/2023 9:13:35 AM Number of Addenda: 0 Note Initiated On: 01/31/2023 8:32 AM Estimated Blood Loss:  Estimated blood loss was minimal.      [facility]

## 2023-01-31 NOTE — Interval H&P Note (Signed)
History and Physical Interval Note:  01/31/2023 8:34 AM  Sydney Evans  has presented today for surgery, with the diagnosis of IDA.  The various methods of treatment have been discussed with the patient and family. After consideration of risks, benefits and other options for treatment, the patient has consented to  Procedure(s): COLONOSCOPY (N/A) ESOPHAGOGASTRODUODENOSCOPY (EGD) WITH PROPOFOL (N/A) as a surgical intervention.  The patient's history has been reviewed, patient examined, no change in status, stable for surgery.  I have reviewed the patient's chart and labs.  Questions were answered to the patient's satisfaction.     Regis Bill  Ok to proceed with EGD/Colonoscopy

## 2023-01-31 NOTE — Transfer of Care (Signed)
Immediate Anesthesia Transfer of Care Note  Patient: Sydney Evans  Procedure(s) Performed: COLONOSCOPY ESOPHAGOGASTRODUODENOSCOPY (EGD) WITH PROPOFOL BIOPSY POLYPECTOMY  Patient Location: Endoscopy Unit  Anesthesia Type:General  Level of Consciousness: drowsy and patient cooperative  Airway & Oxygen Therapy: Patient Spontanous Breathing and Patient connected to face mask oxygen  Post-op Assessment: Report given to RN, Post -op Vital signs reviewed and stable, and Patient moving all extremities X 4  Post vital signs: Reviewed and stable  Last Vitals:  Vitals Value Taken Time  BP 91/52 01/31/23 0916  Temp    Pulse 44 01/31/23 0917  Resp 15 01/31/23 0917  SpO2 100 % 01/31/23 0917  Vitals shown include unfiled device data.  Last Pain:  Vitals:   01/31/23 0812  TempSrc: Temporal  PainSc: 0-No pain         Complications: No notable events documented.

## 2023-02-03 ENCOUNTER — Telehealth: Payer: Self-pay

## 2023-02-03 ENCOUNTER — Other Ambulatory Visit: Payer: Self-pay | Admitting: Psychiatry

## 2023-02-03 ENCOUNTER — Encounter: Payer: Self-pay | Admitting: Gastroenterology

## 2023-02-03 DIAGNOSIS — F319 Bipolar disorder, unspecified: Secondary | ICD-10-CM

## 2023-02-03 LAB — SURGICAL PATHOLOGY

## 2023-02-03 MED ORDER — LAMOTRIGINE 200 MG PO TABS: 200.0000 mg | ORAL_TABLET | Freq: Every day | ORAL | 0 refills | Status: DC

## 2023-02-03 NOTE — Telephone Encounter (Signed)
Lamotrigine is ordered. Could you contact the pharmacy for citalopram? She should have a refill.

## 2023-02-03 NOTE — Progress Notes (Signed)
noted 

## 2023-02-03 NOTE — Telephone Encounter (Signed)
pt states she needs a refill on her medications. she is out. pt nees refill on the citalopram and the lamotrigine. pt was last seen on 9-16 next appt 12-2

## 2023-02-04 NOTE — Telephone Encounter (Signed)
Left message that rx was sent to the pharmacy.  [facility] pharmacy and patient picked up the citalopram on 9-30)

## 2023-02-06 ENCOUNTER — Telehealth: Payer: Self-pay | Admitting: Family

## 2023-02-06 DIAGNOSIS — Z736 Limitation of activities due to disability: Secondary | ICD-10-CM

## 2023-02-06 DIAGNOSIS — R531 Weakness: Secondary | ICD-10-CM

## 2023-02-06 DIAGNOSIS — Z89511 Acquired absence of right leg below knee: Secondary | ICD-10-CM

## 2023-02-06 DIAGNOSIS — Z741 Need for assistance with personal care: Secondary | ICD-10-CM

## 2023-02-06 NOTE — Telephone Encounter (Signed)
Patient called in and stated that she needs an order to be faxed over to [employer] for a 4 prong cane and new wheelchair. It can be faxed over to [phone]. Thank you!

## 2023-02-07 NOTE — Telephone Encounter (Signed)
Spoke with pt. She would like for the order to be for a manual wheelchair. Order for 4 prong cane has been placed by Tabitha and the hard copy has been signed by [place]. Will await the hard copy order of the wheelchair before faxing the cane order.

## 2023-02-07 NOTE — Telephone Encounter (Signed)
Does pt want to try for electric or manual wheelchair? I did put in order for DME for cane, you can print this and fax but pending answer from pt in regards to wheelchair.

## 2023-02-07 NOTE — Addendum Note (Signed)
Addended by: Mort Sawyers on: 02/07/2023 11:14 AM   Modules accepted: Orders

## 2023-02-10 ENCOUNTER — Other Ambulatory Visit: Payer: Self-pay | Admitting: Family

## 2023-02-10 DIAGNOSIS — Z736 Limitation of activities due to disability: Secondary | ICD-10-CM

## 2023-02-10 DIAGNOSIS — Z789 Other specified health status: Secondary | ICD-10-CM

## 2023-02-10 DIAGNOSIS — Z741 Need for assistance with personal care: Secondary | ICD-10-CM

## 2023-02-10 DIAGNOSIS — Z89511 Acquired absence of right leg below knee: Secondary | ICD-10-CM

## 2023-02-10 NOTE — Telephone Encounter (Signed)
Faxed both orders to number as requested.  No further action needed at this time.

## 2023-02-10 NOTE — Telephone Encounter (Signed)
Order placed for wheelchair. Please print and send.

## 2023-02-12 ENCOUNTER — Other Ambulatory Visit: Payer: Self-pay | Admitting: Family

## 2023-02-12 DIAGNOSIS — M79601 Pain in right arm: Secondary | ICD-10-CM | POA: Diagnosis not present

## 2023-02-12 DIAGNOSIS — Z96612 Presence of left artificial shoulder joint: Secondary | ICD-10-CM | POA: Diagnosis not present

## 2023-02-12 DIAGNOSIS — R531 Weakness: Secondary | ICD-10-CM

## 2023-02-12 DIAGNOSIS — Z736 Limitation of activities due to disability: Secondary | ICD-10-CM

## 2023-02-12 DIAGNOSIS — M5412 Radiculopathy, cervical region: Secondary | ICD-10-CM | POA: Diagnosis not present

## 2023-02-12 DIAGNOSIS — M79602 Pain in left arm: Secondary | ICD-10-CM | POA: Diagnosis not present

## 2023-02-12 DIAGNOSIS — Z741 Need for assistance with personal care: Secondary | ICD-10-CM

## 2023-02-12 DIAGNOSIS — Z96611 Presence of right artificial shoulder joint: Secondary | ICD-10-CM | POA: Diagnosis not present

## 2023-02-12 DIAGNOSIS — Z89511 Acquired absence of right leg below knee: Secondary | ICD-10-CM

## 2023-02-13 DIAGNOSIS — E538 Deficiency of other specified B group vitamins: Secondary | ICD-10-CM | POA: Diagnosis not present

## 2023-02-13 DIAGNOSIS — M4312 Spondylolisthesis, cervical region: Secondary | ICD-10-CM | POA: Diagnosis not present

## 2023-02-13 DIAGNOSIS — M47812 Spondylosis without myelopathy or radiculopathy, cervical region: Secondary | ICD-10-CM | POA: Diagnosis not present

## 2023-02-13 DIAGNOSIS — I10 Essential (primary) hypertension: Secondary | ICD-10-CM | POA: Diagnosis not present

## 2023-02-13 DIAGNOSIS — I70221 Atherosclerosis of native arteries of extremities with rest pain, right leg: Secondary | ICD-10-CM | POA: Diagnosis not present

## 2023-02-13 DIAGNOSIS — Z4781 Encounter for orthopedic aftercare following surgical amputation: Secondary | ICD-10-CM | POA: Diagnosis not present

## 2023-02-13 DIAGNOSIS — Z89511 Acquired absence of right leg below knee: Secondary | ICD-10-CM | POA: Diagnosis not present

## 2023-02-13 DIAGNOSIS — D649 Anemia, unspecified: Secondary | ICD-10-CM | POA: Diagnosis not present

## 2023-02-13 DIAGNOSIS — F319 Bipolar disorder, unspecified: Secondary | ICD-10-CM | POA: Diagnosis not present

## 2023-02-14 ENCOUNTER — Telehealth: Payer: Self-pay | Admitting: Family

## 2023-02-14 NOTE — Telephone Encounter (Signed)
Home Health verbal orders Caller Name: Chrissie Noa  Agency Name: [facility]  Callback number: [phone]  Requesting OT/PT/Skilled nursing/Social Work/Speech: OT, Social Work   Reason: Social work for loss of insurance benefits through [employer]. OT for strength and gait training   Frequency: Evaluation only   Please forward to [facility] pool or providers CMA

## 2023-02-17 DIAGNOSIS — D649 Anemia, unspecified: Secondary | ICD-10-CM | POA: Diagnosis not present

## 2023-02-17 DIAGNOSIS — E538 Deficiency of other specified B group vitamins: Secondary | ICD-10-CM | POA: Diagnosis not present

## 2023-02-17 DIAGNOSIS — Z89511 Acquired absence of right leg below knee: Secondary | ICD-10-CM | POA: Diagnosis not present

## 2023-02-17 DIAGNOSIS — I70221 Atherosclerosis of native arteries of extremities with rest pain, right leg: Secondary | ICD-10-CM | POA: Diagnosis not present

## 2023-02-17 DIAGNOSIS — M47812 Spondylosis without myelopathy or radiculopathy, cervical region: Secondary | ICD-10-CM | POA: Diagnosis not present

## 2023-02-17 DIAGNOSIS — F319 Bipolar disorder, unspecified: Secondary | ICD-10-CM | POA: Diagnosis not present

## 2023-02-17 DIAGNOSIS — M4312 Spondylolisthesis, cervical region: Secondary | ICD-10-CM | POA: Diagnosis not present

## 2023-02-17 DIAGNOSIS — I10 Essential (primary) hypertension: Secondary | ICD-10-CM | POA: Diagnosis not present

## 2023-02-17 DIAGNOSIS — Z4781 Encounter for orthopedic aftercare following surgical amputation: Secondary | ICD-10-CM | POA: Diagnosis not present

## 2023-02-17 NOTE — Telephone Encounter (Signed)
Spoke with Sydney Evans. He has been given the verbal order. Nothing further was needed.

## 2023-02-18 DIAGNOSIS — E538 Deficiency of other specified B group vitamins: Secondary | ICD-10-CM | POA: Diagnosis not present

## 2023-02-18 DIAGNOSIS — I10 Essential (primary) hypertension: Secondary | ICD-10-CM | POA: Diagnosis not present

## 2023-02-18 DIAGNOSIS — D649 Anemia, unspecified: Secondary | ICD-10-CM | POA: Diagnosis not present

## 2023-02-18 DIAGNOSIS — M4312 Spondylolisthesis, cervical region: Secondary | ICD-10-CM | POA: Diagnosis not present

## 2023-02-18 DIAGNOSIS — Z89511 Acquired absence of right leg below knee: Secondary | ICD-10-CM | POA: Diagnosis not present

## 2023-02-18 DIAGNOSIS — Z4781 Encounter for orthopedic aftercare following surgical amputation: Secondary | ICD-10-CM | POA: Diagnosis not present

## 2023-02-18 DIAGNOSIS — I70221 Atherosclerosis of native arteries of extremities with rest pain, right leg: Secondary | ICD-10-CM | POA: Diagnosis not present

## 2023-02-18 DIAGNOSIS — F319 Bipolar disorder, unspecified: Secondary | ICD-10-CM | POA: Diagnosis not present

## 2023-02-18 DIAGNOSIS — M47812 Spondylosis without myelopathy or radiculopathy, cervical region: Secondary | ICD-10-CM | POA: Diagnosis not present

## 2023-02-20 DIAGNOSIS — M47812 Spondylosis without myelopathy or radiculopathy, cervical region: Secondary | ICD-10-CM | POA: Diagnosis not present

## 2023-02-20 DIAGNOSIS — D649 Anemia, unspecified: Secondary | ICD-10-CM | POA: Diagnosis not present

## 2023-02-20 DIAGNOSIS — I70221 Atherosclerosis of native arteries of extremities with rest pain, right leg: Secondary | ICD-10-CM | POA: Diagnosis not present

## 2023-02-20 DIAGNOSIS — M4312 Spondylolisthesis, cervical region: Secondary | ICD-10-CM | POA: Diagnosis not present

## 2023-02-20 DIAGNOSIS — E538 Deficiency of other specified B group vitamins: Secondary | ICD-10-CM | POA: Diagnosis not present

## 2023-02-20 DIAGNOSIS — F319 Bipolar disorder, unspecified: Secondary | ICD-10-CM | POA: Diagnosis not present

## 2023-02-20 DIAGNOSIS — Z4781 Encounter for orthopedic aftercare following surgical amputation: Secondary | ICD-10-CM | POA: Diagnosis not present

## 2023-02-20 DIAGNOSIS — I10 Essential (primary) hypertension: Secondary | ICD-10-CM | POA: Diagnosis not present

## 2023-02-20 DIAGNOSIS — Z89511 Acquired absence of right leg below knee: Secondary | ICD-10-CM | POA: Diagnosis not present

## 2023-02-21 ENCOUNTER — Telehealth: Payer: Self-pay | Admitting: Family

## 2023-02-21 NOTE — Telephone Encounter (Signed)
Home Health verbal orders Caller Name:Connie Agency Name:[facility]  Callback [phone]:   Requesting OT/  Reason:  Frequency:1x a wk for 8 weeks  Please forward to [facility] pool or providers CMA

## 2023-02-21 NOTE — Telephone Encounter (Signed)
Home Health verbal orders Caller Name:PHU Agency Name: [facility]  Callback number: [phone]  Requesting OT/PT/Skilled nursing/Social Work/Speech:PT  Reason:EXPERIENCING FREQUENT FALLS  Frequency:2W4,1W4  Please forward to [facility] pool or providers CMA

## 2023-02-24 ENCOUNTER — Encounter: Payer: Self-pay | Admitting: Internal Medicine

## 2023-02-24 NOTE — Telephone Encounter (Signed)
Called and advised PHU with [facility] of the approval of the requested verbal orders for this patient. Advised to call back with any further questions.

## 2023-02-24 NOTE — Telephone Encounter (Signed)
LM for [place] to return call.

## 2023-02-25 DIAGNOSIS — M4312 Spondylolisthesis, cervical region: Secondary | ICD-10-CM | POA: Diagnosis not present

## 2023-02-25 DIAGNOSIS — Z4781 Encounter for orthopedic aftercare following surgical amputation: Secondary | ICD-10-CM | POA: Diagnosis not present

## 2023-02-25 DIAGNOSIS — Z89511 Acquired absence of right leg below knee: Secondary | ICD-10-CM | POA: Diagnosis not present

## 2023-02-25 DIAGNOSIS — E538 Deficiency of other specified B group vitamins: Secondary | ICD-10-CM | POA: Diagnosis not present

## 2023-02-25 DIAGNOSIS — F319 Bipolar disorder, unspecified: Secondary | ICD-10-CM | POA: Diagnosis not present

## 2023-02-25 DIAGNOSIS — I70221 Atherosclerosis of native arteries of extremities with rest pain, right leg: Secondary | ICD-10-CM | POA: Diagnosis not present

## 2023-02-25 DIAGNOSIS — M47812 Spondylosis without myelopathy or radiculopathy, cervical region: Secondary | ICD-10-CM | POA: Diagnosis not present

## 2023-02-25 DIAGNOSIS — I10 Essential (primary) hypertension: Secondary | ICD-10-CM | POA: Diagnosis not present

## 2023-02-25 DIAGNOSIS — D649 Anemia, unspecified: Secondary | ICD-10-CM | POA: Diagnosis not present

## 2023-02-26 DIAGNOSIS — F319 Bipolar disorder, unspecified: Secondary | ICD-10-CM | POA: Diagnosis not present

## 2023-02-26 DIAGNOSIS — D649 Anemia, unspecified: Secondary | ICD-10-CM | POA: Diagnosis not present

## 2023-02-26 DIAGNOSIS — I70221 Atherosclerosis of native arteries of extremities with rest pain, right leg: Secondary | ICD-10-CM | POA: Diagnosis not present

## 2023-02-26 DIAGNOSIS — Z89511 Acquired absence of right leg below knee: Secondary | ICD-10-CM | POA: Diagnosis not present

## 2023-02-26 DIAGNOSIS — E538 Deficiency of other specified B group vitamins: Secondary | ICD-10-CM | POA: Diagnosis not present

## 2023-02-26 DIAGNOSIS — M47812 Spondylosis without myelopathy or radiculopathy, cervical region: Secondary | ICD-10-CM | POA: Diagnosis not present

## 2023-02-26 DIAGNOSIS — Z4781 Encounter for orthopedic aftercare following surgical amputation: Secondary | ICD-10-CM | POA: Diagnosis not present

## 2023-02-26 DIAGNOSIS — M4312 Spondylolisthesis, cervical region: Secondary | ICD-10-CM | POA: Diagnosis not present

## 2023-02-26 DIAGNOSIS — I10 Essential (primary) hypertension: Secondary | ICD-10-CM | POA: Diagnosis not present

## 2023-02-28 DIAGNOSIS — D649 Anemia, unspecified: Secondary | ICD-10-CM | POA: Diagnosis not present

## 2023-02-28 DIAGNOSIS — Z4781 Encounter for orthopedic aftercare following surgical amputation: Secondary | ICD-10-CM | POA: Diagnosis not present

## 2023-02-28 DIAGNOSIS — E538 Deficiency of other specified B group vitamins: Secondary | ICD-10-CM | POA: Diagnosis not present

## 2023-02-28 DIAGNOSIS — I10 Essential (primary) hypertension: Secondary | ICD-10-CM | POA: Diagnosis not present

## 2023-02-28 DIAGNOSIS — M4312 Spondylolisthesis, cervical region: Secondary | ICD-10-CM | POA: Diagnosis not present

## 2023-02-28 DIAGNOSIS — F319 Bipolar disorder, unspecified: Secondary | ICD-10-CM | POA: Diagnosis not present

## 2023-02-28 DIAGNOSIS — I70221 Atherosclerosis of native arteries of extremities with rest pain, right leg: Secondary | ICD-10-CM | POA: Diagnosis not present

## 2023-02-28 DIAGNOSIS — M47812 Spondylosis without myelopathy or radiculopathy, cervical region: Secondary | ICD-10-CM | POA: Diagnosis not present

## 2023-02-28 DIAGNOSIS — Z89511 Acquired absence of right leg below knee: Secondary | ICD-10-CM | POA: Diagnosis not present

## 2023-03-05 DIAGNOSIS — I10 Essential (primary) hypertension: Secondary | ICD-10-CM | POA: Diagnosis not present

## 2023-03-05 DIAGNOSIS — F319 Bipolar disorder, unspecified: Secondary | ICD-10-CM | POA: Diagnosis not present

## 2023-03-05 DIAGNOSIS — I70221 Atherosclerosis of native arteries of extremities with rest pain, right leg: Secondary | ICD-10-CM | POA: Diagnosis not present

## 2023-03-05 DIAGNOSIS — E538 Deficiency of other specified B group vitamins: Secondary | ICD-10-CM | POA: Diagnosis not present

## 2023-03-05 DIAGNOSIS — Z89511 Acquired absence of right leg below knee: Secondary | ICD-10-CM | POA: Diagnosis not present

## 2023-03-05 DIAGNOSIS — M4312 Spondylolisthesis, cervical region: Secondary | ICD-10-CM | POA: Diagnosis not present

## 2023-03-05 DIAGNOSIS — D649 Anemia, unspecified: Secondary | ICD-10-CM | POA: Diagnosis not present

## 2023-03-05 DIAGNOSIS — M47812 Spondylosis without myelopathy or radiculopathy, cervical region: Secondary | ICD-10-CM | POA: Diagnosis not present

## 2023-03-05 DIAGNOSIS — Z4781 Encounter for orthopedic aftercare following surgical amputation: Secondary | ICD-10-CM | POA: Diagnosis not present

## 2023-03-06 DIAGNOSIS — D649 Anemia, unspecified: Secondary | ICD-10-CM | POA: Diagnosis not present

## 2023-03-06 DIAGNOSIS — Z4781 Encounter for orthopedic aftercare following surgical amputation: Secondary | ICD-10-CM | POA: Diagnosis not present

## 2023-03-06 DIAGNOSIS — I70221 Atherosclerosis of native arteries of extremities with rest pain, right leg: Secondary | ICD-10-CM | POA: Diagnosis not present

## 2023-03-06 DIAGNOSIS — F319 Bipolar disorder, unspecified: Secondary | ICD-10-CM | POA: Diagnosis not present

## 2023-03-06 DIAGNOSIS — M4312 Spondylolisthesis, cervical region: Secondary | ICD-10-CM | POA: Diagnosis not present

## 2023-03-06 DIAGNOSIS — I10 Essential (primary) hypertension: Secondary | ICD-10-CM | POA: Diagnosis not present

## 2023-03-06 DIAGNOSIS — E538 Deficiency of other specified B group vitamins: Secondary | ICD-10-CM | POA: Diagnosis not present

## 2023-03-06 DIAGNOSIS — M47812 Spondylosis without myelopathy or radiculopathy, cervical region: Secondary | ICD-10-CM | POA: Diagnosis not present

## 2023-03-06 DIAGNOSIS — Z89511 Acquired absence of right leg below knee: Secondary | ICD-10-CM | POA: Diagnosis not present

## 2023-03-07 ENCOUNTER — Encounter: Payer: Self-pay | Admitting: Internal Medicine

## 2023-03-07 DIAGNOSIS — M47812 Spondylosis without myelopathy or radiculopathy, cervical region: Secondary | ICD-10-CM | POA: Diagnosis not present

## 2023-03-07 DIAGNOSIS — Z89511 Acquired absence of right leg below knee: Secondary | ICD-10-CM | POA: Diagnosis not present

## 2023-03-07 DIAGNOSIS — D649 Anemia, unspecified: Secondary | ICD-10-CM | POA: Diagnosis not present

## 2023-03-07 DIAGNOSIS — Z4781 Encounter for orthopedic aftercare following surgical amputation: Secondary | ICD-10-CM | POA: Diagnosis not present

## 2023-03-07 DIAGNOSIS — I10 Essential (primary) hypertension: Secondary | ICD-10-CM | POA: Diagnosis not present

## 2023-03-07 DIAGNOSIS — M4312 Spondylolisthesis, cervical region: Secondary | ICD-10-CM | POA: Diagnosis not present

## 2023-03-07 DIAGNOSIS — F319 Bipolar disorder, unspecified: Secondary | ICD-10-CM | POA: Diagnosis not present

## 2023-03-07 DIAGNOSIS — E538 Deficiency of other specified B group vitamins: Secondary | ICD-10-CM | POA: Diagnosis not present

## 2023-03-07 DIAGNOSIS — I70221 Atherosclerosis of native arteries of extremities with rest pain, right leg: Secondary | ICD-10-CM | POA: Diagnosis not present

## 2023-03-10 ENCOUNTER — Telehealth: Payer: Self-pay | Admitting: Family

## 2023-03-10 DIAGNOSIS — I70221 Atherosclerosis of native arteries of extremities with rest pain, right leg: Secondary | ICD-10-CM

## 2023-03-10 DIAGNOSIS — Z89511 Acquired absence of right leg below knee: Secondary | ICD-10-CM

## 2023-03-10 DIAGNOSIS — R0609 Other forms of dyspnea: Secondary | ICD-10-CM

## 2023-03-10 MED ORDER — ALBUTEROL SULFATE HFA 108 (90 BASE) MCG/ACT IN AERS: 2.0000 | INHALATION_SPRAY | RESPIRATORY_TRACT | 0 refills | Status: DC | PRN

## 2023-03-10 MED ORDER — GABAPENTIN 300 MG PO CAPS
300.0000 mg | ORAL_CAPSULE | Freq: Four times a day (QID) | ORAL | 3 refills | Status: DC
Start: 2023-03-10 — End: 2023-03-26

## 2023-03-10 NOTE — Telephone Encounter (Signed)
Refilled

## 2023-03-10 NOTE — Telephone Encounter (Signed)
Prescription Request  03/10/2023  LOV: 12/24/2022  What is the name of the medication or equipment? albuterol (VENTOLIN HFA) 108 (90 Base) MCG/ACT inhaler & gabapentin (NEURONTIN) 300 MG capsule   Have you contacted your pharmacy to request a refill? No   Which pharmacy would you like this sent to?  [facility] [address] ([address]), [place] - [address] [place]) [place] [address] Phone: [phone] Fax: [phone]    Patient notified that their request is being sent to the clinical staff for review and that they should receive a response within 2 business days.   Please advise at Mobile [phone] (mobile)

## 2023-03-10 NOTE — Addendum Note (Signed)
Addended by: Mort Sawyers on: 03/10/2023 10:56 AM   Modules accepted: Orders

## 2023-03-11 DIAGNOSIS — F319 Bipolar disorder, unspecified: Secondary | ICD-10-CM | POA: Diagnosis not present

## 2023-03-11 DIAGNOSIS — I70221 Atherosclerosis of native arteries of extremities with rest pain, right leg: Secondary | ICD-10-CM | POA: Diagnosis not present

## 2023-03-11 DIAGNOSIS — Z4781 Encounter for orthopedic aftercare following surgical amputation: Secondary | ICD-10-CM | POA: Diagnosis not present

## 2023-03-11 DIAGNOSIS — Z89511 Acquired absence of right leg below knee: Secondary | ICD-10-CM | POA: Diagnosis not present

## 2023-03-11 DIAGNOSIS — M47812 Spondylosis without myelopathy or radiculopathy, cervical region: Secondary | ICD-10-CM | POA: Diagnosis not present

## 2023-03-11 DIAGNOSIS — E538 Deficiency of other specified B group vitamins: Secondary | ICD-10-CM | POA: Diagnosis not present

## 2023-03-11 DIAGNOSIS — M4312 Spondylolisthesis, cervical region: Secondary | ICD-10-CM | POA: Diagnosis not present

## 2023-03-11 DIAGNOSIS — I10 Essential (primary) hypertension: Secondary | ICD-10-CM | POA: Diagnosis not present

## 2023-03-11 DIAGNOSIS — D649 Anemia, unspecified: Secondary | ICD-10-CM | POA: Diagnosis not present

## 2023-03-12 DIAGNOSIS — Z4781 Encounter for orthopedic aftercare following surgical amputation: Secondary | ICD-10-CM | POA: Diagnosis not present

## 2023-03-12 DIAGNOSIS — I70221 Atherosclerosis of native arteries of extremities with rest pain, right leg: Secondary | ICD-10-CM | POA: Diagnosis not present

## 2023-03-12 DIAGNOSIS — Z89511 Acquired absence of right leg below knee: Secondary | ICD-10-CM | POA: Diagnosis not present

## 2023-03-12 DIAGNOSIS — D649 Anemia, unspecified: Secondary | ICD-10-CM | POA: Diagnosis not present

## 2023-03-12 DIAGNOSIS — M47812 Spondylosis without myelopathy or radiculopathy, cervical region: Secondary | ICD-10-CM | POA: Diagnosis not present

## 2023-03-12 DIAGNOSIS — E538 Deficiency of other specified B group vitamins: Secondary | ICD-10-CM | POA: Diagnosis not present

## 2023-03-12 DIAGNOSIS — M4312 Spondylolisthesis, cervical region: Secondary | ICD-10-CM | POA: Diagnosis not present

## 2023-03-12 DIAGNOSIS — F319 Bipolar disorder, unspecified: Secondary | ICD-10-CM | POA: Diagnosis not present

## 2023-03-12 DIAGNOSIS — I10 Essential (primary) hypertension: Secondary | ICD-10-CM | POA: Diagnosis not present

## 2023-03-14 ENCOUNTER — Ambulatory Visit: Payer: 59

## 2023-03-14 ENCOUNTER — Other Ambulatory Visit: Payer: 59

## 2023-03-14 ENCOUNTER — Inpatient Hospital Stay (HOSPITAL_BASED_OUTPATIENT_CLINIC_OR_DEPARTMENT_OTHER): Payer: Medicare HMO | Admitting: Internal Medicine

## 2023-03-14 ENCOUNTER — Inpatient Hospital Stay: Payer: Medicare HMO

## 2023-03-14 ENCOUNTER — Ambulatory Visit: Payer: 59 | Admitting: Internal Medicine

## 2023-03-14 ENCOUNTER — Inpatient Hospital Stay: Payer: Medicare HMO | Attending: Internal Medicine

## 2023-03-14 VITALS — BP 148/72 | HR 53 | Temp 98.3°F | Wt 167.0 lb

## 2023-03-14 DIAGNOSIS — M47812 Spondylosis without myelopathy or radiculopathy, cervical region: Secondary | ICD-10-CM | POA: Diagnosis not present

## 2023-03-14 DIAGNOSIS — D649 Anemia, unspecified: Secondary | ICD-10-CM | POA: Diagnosis not present

## 2023-03-14 DIAGNOSIS — K648 Other hemorrhoids: Secondary | ICD-10-CM | POA: Insufficient documentation

## 2023-03-14 DIAGNOSIS — I10 Essential (primary) hypertension: Secondary | ICD-10-CM | POA: Diagnosis not present

## 2023-03-14 DIAGNOSIS — K5730 Diverticulosis of large intestine without perforation or abscess without bleeding: Secondary | ICD-10-CM | POA: Insufficient documentation

## 2023-03-14 DIAGNOSIS — I70221 Atherosclerosis of native arteries of extremities with rest pain, right leg: Secondary | ICD-10-CM | POA: Diagnosis not present

## 2023-03-14 DIAGNOSIS — K219 Gastro-esophageal reflux disease without esophagitis: Secondary | ICD-10-CM | POA: Insufficient documentation

## 2023-03-14 DIAGNOSIS — D509 Iron deficiency anemia, unspecified: Secondary | ICD-10-CM

## 2023-03-14 DIAGNOSIS — Z89511 Acquired absence of right leg below knee: Secondary | ICD-10-CM | POA: Diagnosis not present

## 2023-03-14 DIAGNOSIS — F319 Bipolar disorder, unspecified: Secondary | ICD-10-CM | POA: Diagnosis not present

## 2023-03-14 DIAGNOSIS — E538 Deficiency of other specified B group vitamins: Secondary | ICD-10-CM

## 2023-03-14 DIAGNOSIS — Z4781 Encounter for orthopedic aftercare following surgical amputation: Secondary | ICD-10-CM | POA: Diagnosis not present

## 2023-03-14 DIAGNOSIS — M4312 Spondylolisthesis, cervical region: Secondary | ICD-10-CM | POA: Diagnosis not present

## 2023-03-14 LAB — CBC WITH DIFFERENTIAL/PLATELET
Abs Immature Granulocytes: 0.05 10*3/uL (ref 0.00–0.07)
Basophils Absolute: 0.1 10*3/uL (ref 0.0–0.1)
Basophils Relative: 1 %
Eosinophils Absolute: 0.2 10*3/uL (ref 0.0–0.5)
Eosinophils Relative: 4 %
HCT: 37.5 % (ref 36.0–46.0)
Hemoglobin: 12.1 g/dL (ref 12.0–15.0)
Immature Granulocytes: 1 %
Lymphocytes Relative: 30 %
Lymphs Abs: 1.6 10*3/uL (ref 0.7–4.0)
MCH: 29.5 pg (ref 26.0–34.0)
MCHC: 32.3 g/dL (ref 30.0–36.0)
MCV: 91.5 fL (ref 80.0–100.0)
Monocytes Absolute: 0.3 10*3/uL (ref 0.1–1.0)
Monocytes Relative: 6 %
Neutro Abs: 3.1 10*3/uL (ref 1.7–7.7)
Neutrophils Relative %: 58 %
Platelets: 324 10*3/uL (ref 150–400)
RBC: 4.1 MIL/uL (ref 3.87–5.11)
RDW: 15 % (ref 11.5–15.5)
WBC: 5.3 10*3/uL (ref 4.0–10.5)
nRBC: 0 % (ref 0.0–0.2)

## 2023-03-14 LAB — IRON AND TIBC
Iron: 77 ug/dL (ref 28–170)
Saturation Ratios: 24 % (ref 10.4–31.8)
TIBC: 315 ug/dL (ref 250–450)
UIBC: 238 ug/dL

## 2023-03-14 LAB — VITAMIN B12: Vitamin B-12: 320 pg/mL (ref 180–914)

## 2023-03-14 LAB — FERRITIN: Ferritin: 118 ng/mL (ref 11–307)

## 2023-03-14 NOTE — Progress Notes (Signed)
Patient is having some nausea, and some urinary issues which she says that this doesn't feel like this would be a UTI. She is having

## 2023-03-14 NOTE — Progress Notes (Signed)
[facility]  [phone]) [phone] [phone]) [phone]  ID: Sydney Evans OB: Aug 20, 1950  MR#: [id number]  PIR#:[id number]  Patient Care Team: Mort Sawyers, FNP as PCP - General (Family Medicine) Lucretia Roers Dory Larsen, MD as Referring Physician (Vascular Surgery) Wenda Overland, LCSW as Social Worker Michaelyn Barter, MD as Consulting Physician (Oncology)  REFERRING PROVIDER: Mort Sawyers, FNP  REASON FOR REFERRAL: Iron deficiency anemia  HPI: Sydney Evans is a 72 y.o. female with past medical history of iron deficiency anemia, Barrett's esophagus GERD, hypertension, migraine, right BKA for PVD in 2023 referred to hematology for management of iron deficiency anemia.  Patient reports history of diarrhea, nausea and vomiting for the past 3 months.  Follows with Dr. Mia Creek of GI.  Has not been able to eat. Endoscopic with Dr. Mia Creek on 12/14/2019 showed 11 mm submucosal nodule in the first portion of duodenum and a papillary lesion 3 mm in the duodenal bulb.  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  Colonoscopy from 2019 was normal.  CT abdomen pelvis (08/22/2022) done for abdominal pain- no acute abnormality  Longstanding history of iron deficiency anemia-previously seen by Dr. Cathie Hoops and received IV Venofer.   Colonoscopy by Dr. Mia Creek on 01/31/2023 showed 1 mm polyp in the ascending colon, diverticulosis and internal hemorrhoids.  Upper endoscopy showed duodenal lipoma.  Interval history Patient seen today as follow-up accompanied by friend for iron deficiency anemia and labs. She completed her IV Venofer in August 2024.  Does not report any improvement in her symptoms of energy.  Denies any bleeding in urine or stools.  Feels sluggish.  REVIEW OF SYSTEMS:   Review of Systems  Constitutional:  Positive for malaise/fatigue.    As per HPI. Otherwise, a complete review of systems is negative.  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   Carotid arterial disease (HCC)    Contracture of joint of upper arm    Critical limb ischemia of right lower extremity (HCC) 06/21/2021   Depression    Dislocation of right shoulder joint 10/06/2019   Disorder of bone and articular cartilage 09/29/2014   Elevated lipids    GERD (gastroesophageal reflux disease)    Hypertension    Migraine    Osteoporosis    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   Poor balance    Rotator cuff tendinitis, left 05/20/2016   Sinus congestion    Status post total shoulder arthroplasty, 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   Status post total shoulder replacement, 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   Stroke (HCC)    x 2   TIA (transient ischemic attack)    Traumatic complete tear of right rotator cuff 10/08/2019   Vertigo     PAST SURGICAL HISTORY: Past Surgical History:  Procedure Laterality Date   ABDOMINAL HYSTERECTOMY     pt states might still have ovaries   BIOPSY  01/31/2023   Procedure: BIOPSY;  Surgeon: Regis Bill, MD;  Location: [facility] ENDOSCOPY;  Service: Endoscopy;;   bka right     BREAST BIOPSY Left    CARPAL TUNNEL RELEASE Bilateral    CATARACT EXTRACTION W/ INTRAOCULAR LENS  IMPLANT, BILATERAL Bilateral  CHOLECYSTECTOMY     COLONOSCOPY N/A 01/31/2023   Procedure: COLONOSCOPY;  Surgeon: Regis Bill, MD;  Location: [facility] ENDOSCOPY;  Service: Endoscopy;  Laterality: N/A;   COLONOSCOPY WITH PROPOFOL N/A 04/09/2018   Procedure: COLONOSCOPY WITH PROPOFOL;  Surgeon: Wyline Mood, MD;  Location: [facility] ENDOSCOPY;  Service: Gastroenterology;  Laterality: N/A;   crystal cyst removed on left foot     ESOPHAGOGASTRODUODENOSCOPY (EGD) WITH PROPOFOL N/A 04/09/2018   Procedure: ESOPHAGOGASTRODUODENOSCOPY (EGD) WITH  PROPOFOL;  Surgeon: Wyline Mood, MD;  Location: [facility] ENDOSCOPY;  Service: Gastroenterology;  Laterality: N/A;   ESOPHAGOGASTRODUODENOSCOPY (EGD) WITH PROPOFOL N/A 12/14/2019   Procedure: ESOPHAGOGASTRODUODENOSCOPY (EGD) WITH PROPOFOL;  Surgeon: Regis Bill, MD;  Location: [facility] ENDOSCOPY;  Service: Endoscopy;  Laterality: N/A;   ESOPHAGOGASTRODUODENOSCOPY (EGD) WITH PROPOFOL N/A 01/31/2023   Procedure: ESOPHAGOGASTRODUODENOSCOPY (EGD) WITH PROPOFOL;  Surgeon: Regis Bill, MD;  Location: [facility] ENDOSCOPY;  Service: Endoscopy;  Laterality: N/A;   EYE SURGERY     femoral fx     FOOT SURGERY     GIVENS CAPSULE STUDY N/A 06/16/2018   Procedure: GIVENS CAPSULE STUDY;  Surgeon: Wyline Mood, MD;  Location: [facility] ENDOSCOPY;  Service: Gastroenterology;  Laterality: N/A;   JOINT REPLACEMENT     KNEE SURGERY     KYPHOPLASTY     LOWER EXTREMITY ANGIOGRAPHY Right 06/21/2021   Procedure: Lower Extremity Angiography;  Surgeon: Renford Dills, MD;  Location: [facility] INVASIVE CV LAB;  Service: Cardiovascular;  Laterality: Right;   ORIF PERIPROSTHETIC FRACTURE Right 10/07/2019   Procedure: OPEN REDUCTION INTERNAL FIXATION (ORIF) PERIPROSTHETIC FRACTURE;  Surgeon: Christena Flake, MD;  Location: [facility] ORS;  Service: Orthopedics;  Laterality: Right;   POLYPECTOMY  01/31/2023   Procedure: POLYPECTOMY;  Surgeon: Regis Bill, MD;  Location: [facility] ENDOSCOPY;  Service: Endoscopy;;   TONSILLECTOMY     TOTAL KNEE ARTHROPLASTY Bilateral    TOTAL SHOULDER ARTHROPLASTY Left 08/01/2016   Procedure: TOTAL SHOULDER ARTHROPLASTY;  Surgeon: Christena Flake, MD;  Location: [facility] ORS;  Service: Orthopedics;  Laterality: Left;   TOTAL SHOULDER ARTHROPLASTY Right 09/30/2019   Procedure: TOTAL SHOULDER ARTHROPLASTY;  Surgeon: Christena Flake, MD;  Location: [facility] ORS;  Service: Orthopedics;  Laterality: Right;    FAMILY HISTORY: Family History  Problem Relation Age of Onset   Other Mother        ?lupus    Cancer  Mother    High Cholesterol Mother    Alcohol abuse Father    CAD Father        CABG   High Cholesterol Father    Arthritis Father    Heart murmur Sister    Bradycardia Sister    Heart murmur Brother    High blood pressure Other        "for everybody"   High Cholesterol Other        "for everybody"    HEALTH MAINTENANCE: Social History   Tobacco Use   Smoking status: Every Day    Current packs/day: 0.00    Types: Cigarettes    Last attempt to quit: 08/04/2016    Years since quitting: 6.6   Smokeless tobacco: Never   Tobacco comments:    1 pack lasts about 2.5 days  Vaping Use   Vaping status: Never Used  Substance Use Topics   Alcohol use: No   Drug use: No     Allergies  Allergen Reactions   Hydroxyzine Hives and Rash   Bactrim [Sulfamethoxazole-Trimethoprim] Nausea And Vomiting   Dilaudid [Hydromorphone]  Itching   Ketorolac Rash     May take with benadryl   Toradol [Ketorolac Tromethamine] Rash    May take with benadryl   Tramadol Hives   Zofran [Ondansetron] Other (See Comments)    Broke mouth out    Current Outpatient Medications  Medication Sig Dispense Refill   acetaminophen (TYLENOL) 500 MG tablet Take 500 mg by mouth every 6 (six) hours as needed.     albuterol (VENTOLIN HFA) 108 (90 Base) MCG/ACT inhaler Inhale 2 puffs into the lungs every 4 (four) hours as needed for shortness of breath. 1 each 0   amLODipine (NORVASC) 5 MG tablet Take 1 tablet (5 mg total) by mouth daily. 90 tablet 3   atorvastatin (LIPITOR) 10 MG tablet Take 1 tablet (10 mg total) by mouth daily. 90 tablet 3   Carboxymethylcellulose Sodium (LUBRICANT EYE DROPS OP) Place 1 drop into both eyes daily as needed (Dry eye). Systane     citalopram (CELEXA) 40 MG tablet Take 1 tablet (40 mg total) by mouth daily. 90 tablet 1   diphenoxylate-atropine (LOMOTIL) 2.5-0.025 MG tablet Take 1-2 tablets by mouth 4 (four) times daily as needed for diarrhea or loose stools (max 8 tabs per day). 30  tablet 0   gabapentin (NEURONTIN) 300 MG capsule Take 1 capsule (300 mg total) by mouth 4 (four) times daily. 360 capsule 3   lamoTRIgine (LAMICTAL) 200 MG tablet Take 1 tablet (200 mg total) by mouth daily. 90 tablet 0   pantoprazole (PROTONIX) 40 MG tablet Take 1 tablet (40 mg total) by mouth 2 (two) times daily. 180 tablet 1   Probiotic Product (FORTIFY PROBIOTIC WOMENS PO) Take 1 Capful by mouth daily.     propranolol (INDERAL) 20 MG tablet Take 1 tablet (20 mg total) by mouth daily as needed. 30 tablet 0   valACYclovir (VALTREX) 500 MG tablet Take 1 tablet (500 mg total) by mouth daily. 90 tablet 0   No current facility-administered medications for this visit.    OBJECTIVE: Vitals:   03/14/23 1355  BP: (!) 148/72  Pulse: (!) 53  Temp: 98.3 F (36.8 C)  SpO2: 93%     Body mass index is 30.54 kg/m.      General: Well-developed, well-nourished, no acute distress. Eyes: Pink conjunctiva, anicteric sclera. HEENT: Normocephalic, moist mucous membranes, clear oropharnyx. Lungs: Clear to auscultation bilaterally. Heart: Regular rate and rhythm. No rubs, murmurs, or gallops. Abdomen: Soft, nontender, nondistended. No organomegaly noted, normoactive bowel sounds. Musculoskeletal: No edema, cyanosis, or clubbing. Neuro: Alert, answering all questions appropriately. Cranial nerves grossly intact. Skin: No rashes or petechiae noted. Psych: Normal affect. Lymphatics: No cervical, calvicular, axillary or inguinal LAD.   LAB RESULTS:  Lab Results  Component Value Date   NA 138 12/12/2022   K 3.3 (L) 12/12/2022   CL 110 12/12/2022   CO2 18 (L) 12/12/2022   GLUCOSE 99 12/12/2022   BUN 10 12/12/2022   CREATININE 1.02 (H) 12/12/2022   CALCIUM 8.0 (L) 12/12/2022   PROT 6.8 12/12/2022   ALBUMIN 3.4 (L) 12/12/2022   AST 15 12/12/2022   ALT 9 12/12/2022   ALKPHOS 64 12/12/2022   BILITOT 0.2 (L) 12/12/2022   GFRNONAA 59 (L) 12/12/2022   GFRAA 86 03/17/2020    Lab Results   Component Value Date   WBC 5.3 03/14/2023   NEUTROABS 3.1 03/14/2023   HGB 12.1 03/14/2023   HCT 37.5 03/14/2023   MCV 91.5 03/14/2023   PLT 324 03/14/2023  Lab Results  Component Value Date   TIBC 367 12/10/2022   TIBC 484.4 (H) 09/05/2022   TIBC 404.6 08/01/2021   FERRITIN 14 12/10/2022   FERRITIN 5.8 (L) 09/05/2022   FERRITIN 24.2 08/01/2021   IRONPCTSAT 12 12/10/2022   IRONPCTSAT 2.9 (L) 09/05/2022   IRONPCTSAT 8.9 (L) 08/01/2021     STUDIES: No results found.  ASSESSMENT AND PLAN:   Gittel Limmer is a 72 y.o. female with pmh of iron deficiency anemia, Barrett's esophagus GERD, hypertension, migraine, right BKA for PVD in 2023 referred to hematology for management of iron deficiency anemia.  # Iron deficiency anemia -Progressive.  Could not tolerate oral iron due to diarrhea.  Treated with IV Venofer previously in 06/2021.   -Patient has required multiple iron infusions since May 2024.  Hemoglobin has improved from 8.9-12.1 today.  Iron panel is pending.  Will hold off on IV Venofer today.  I will reach out to her if she needs more iron infusion.    - Colonoscopy by Dr. Mia Creek on 01/31/2023 showed 1 mm polyp in the ascending colon, diverticulosis and internal hemorrhoids.  Upper endoscopy showed duodenal lipoma.  # History of B12 deficiency -On B12 1000 mcg oral supplements. -B12 level pending from today.  If still low, will benefit from IM injections.  Orders Placed This Encounter  Procedures   CBC with Differential (Cancer Center Only)   Ferritin   Iron and TIBC   Vitamin B12   RTC in 6 months for MD visit, labs, Venofer  Patient expressed understanding and was in agreement with this plan. She also understands that She can call clinic at any time with any questions, concerns, or complaints.   I spent a total of 25 minutes reviewing chart data, face-to-face evaluation with the patient, counseling and coordination of care as detailed above.  Michaelyn Barter, MD   03/14/2023 2:25 PM

## 2023-03-15 DIAGNOSIS — I70221 Atherosclerosis of native arteries of extremities with rest pain, right leg: Secondary | ICD-10-CM | POA: Diagnosis not present

## 2023-03-15 DIAGNOSIS — E538 Deficiency of other specified B group vitamins: Secondary | ICD-10-CM | POA: Diagnosis not present

## 2023-03-15 DIAGNOSIS — I10 Essential (primary) hypertension: Secondary | ICD-10-CM | POA: Diagnosis not present

## 2023-03-15 DIAGNOSIS — D649 Anemia, unspecified: Secondary | ICD-10-CM | POA: Diagnosis not present

## 2023-03-15 DIAGNOSIS — Z89511 Acquired absence of right leg below knee: Secondary | ICD-10-CM | POA: Diagnosis not present

## 2023-03-15 DIAGNOSIS — Z4781 Encounter for orthopedic aftercare following surgical amputation: Secondary | ICD-10-CM | POA: Diagnosis not present

## 2023-03-15 DIAGNOSIS — F319 Bipolar disorder, unspecified: Secondary | ICD-10-CM | POA: Diagnosis not present

## 2023-03-15 DIAGNOSIS — M4312 Spondylolisthesis, cervical region: Secondary | ICD-10-CM | POA: Diagnosis not present

## 2023-03-15 DIAGNOSIS — M47812 Spondylosis without myelopathy or radiculopathy, cervical region: Secondary | ICD-10-CM | POA: Diagnosis not present

## 2023-03-17 ENCOUNTER — Telehealth: Payer: Self-pay | Admitting: Family

## 2023-03-17 NOTE — Telephone Encounter (Signed)
Sydney Evans, pt's insurance agent, called stating Sydney Evans is requiring our office to submit a rx for the pt's wheelchair. Sydney Evans states he was told by [facility] to have our office upload the rx to a "parachute portal". Sydney Evans states he wasn't sure what the portal was but requested for rx to be sent to [employer] [facility], if our office had issues finding info on the portal? Call back # [phone]

## 2023-03-17 NOTE — Telephone Encounter (Signed)
Not sure what portal they are talking about but ok to send wheelchair order (although I do believe we placed one not too long ago)

## 2023-03-17 NOTE — Telephone Encounter (Signed)
Ok to place order 

## 2023-03-17 NOTE — Telephone Encounter (Signed)
Order placed on website. Will fax order to office that needs signature.

## 2023-03-18 DIAGNOSIS — I70221 Atherosclerosis of native arteries of extremities with rest pain, right leg: Secondary | ICD-10-CM | POA: Diagnosis not present

## 2023-03-18 DIAGNOSIS — D649 Anemia, unspecified: Secondary | ICD-10-CM | POA: Diagnosis not present

## 2023-03-18 DIAGNOSIS — Z4781 Encounter for orthopedic aftercare following surgical amputation: Secondary | ICD-10-CM | POA: Diagnosis not present

## 2023-03-18 DIAGNOSIS — E538 Deficiency of other specified B group vitamins: Secondary | ICD-10-CM | POA: Diagnosis not present

## 2023-03-18 DIAGNOSIS — I10 Essential (primary) hypertension: Secondary | ICD-10-CM | POA: Diagnosis not present

## 2023-03-18 DIAGNOSIS — Z89511 Acquired absence of right leg below knee: Secondary | ICD-10-CM | POA: Diagnosis not present

## 2023-03-18 DIAGNOSIS — F319 Bipolar disorder, unspecified: Secondary | ICD-10-CM | POA: Diagnosis not present

## 2023-03-18 DIAGNOSIS — M4312 Spondylolisthesis, cervical region: Secondary | ICD-10-CM | POA: Diagnosis not present

## 2023-03-18 DIAGNOSIS — M47812 Spondylosis without myelopathy or radiculopathy, cervical region: Secondary | ICD-10-CM | POA: Diagnosis not present

## 2023-03-20 DIAGNOSIS — M47812 Spondylosis without myelopathy or radiculopathy, cervical region: Secondary | ICD-10-CM | POA: Diagnosis not present

## 2023-03-20 DIAGNOSIS — I10 Essential (primary) hypertension: Secondary | ICD-10-CM | POA: Diagnosis not present

## 2023-03-20 DIAGNOSIS — F319 Bipolar disorder, unspecified: Secondary | ICD-10-CM | POA: Diagnosis not present

## 2023-03-20 DIAGNOSIS — D649 Anemia, unspecified: Secondary | ICD-10-CM | POA: Diagnosis not present

## 2023-03-20 DIAGNOSIS — M4312 Spondylolisthesis, cervical region: Secondary | ICD-10-CM | POA: Diagnosis not present

## 2023-03-20 DIAGNOSIS — I70221 Atherosclerosis of native arteries of extremities with rest pain, right leg: Secondary | ICD-10-CM | POA: Diagnosis not present

## 2023-03-20 DIAGNOSIS — Z4781 Encounter for orthopedic aftercare following surgical amputation: Secondary | ICD-10-CM | POA: Diagnosis not present

## 2023-03-20 DIAGNOSIS — E538 Deficiency of other specified B group vitamins: Secondary | ICD-10-CM | POA: Diagnosis not present

## 2023-03-20 DIAGNOSIS — Z89511 Acquired absence of right leg below knee: Secondary | ICD-10-CM | POA: Diagnosis not present

## 2023-03-24 DIAGNOSIS — I10 Essential (primary) hypertension: Secondary | ICD-10-CM | POA: Diagnosis not present

## 2023-03-24 DIAGNOSIS — F319 Bipolar disorder, unspecified: Secondary | ICD-10-CM | POA: Diagnosis not present

## 2023-03-24 DIAGNOSIS — M4312 Spondylolisthesis, cervical region: Secondary | ICD-10-CM | POA: Diagnosis not present

## 2023-03-24 DIAGNOSIS — Z4781 Encounter for orthopedic aftercare following surgical amputation: Secondary | ICD-10-CM | POA: Diagnosis not present

## 2023-03-24 DIAGNOSIS — D649 Anemia, unspecified: Secondary | ICD-10-CM | POA: Diagnosis not present

## 2023-03-24 DIAGNOSIS — E538 Deficiency of other specified B group vitamins: Secondary | ICD-10-CM | POA: Diagnosis not present

## 2023-03-24 DIAGNOSIS — I70221 Atherosclerosis of native arteries of extremities with rest pain, right leg: Secondary | ICD-10-CM | POA: Diagnosis not present

## 2023-03-24 DIAGNOSIS — M47812 Spondylosis without myelopathy or radiculopathy, cervical region: Secondary | ICD-10-CM | POA: Diagnosis not present

## 2023-03-24 DIAGNOSIS — Z89511 Acquired absence of right leg below knee: Secondary | ICD-10-CM | POA: Diagnosis not present

## 2023-03-26 ENCOUNTER — Ambulatory Visit: Payer: Medicare HMO | Admitting: Family

## 2023-03-26 ENCOUNTER — Ambulatory Visit (INDEPENDENT_AMBULATORY_CARE_PROVIDER_SITE_OTHER)
Admission: RE | Admit: 2023-03-26 | Discharge: 2023-03-26 | Disposition: A | Discharge status: Home / Self Care | Payer: Medicare HMO | Source: Ambulatory Visit | Attending: Family

## 2023-03-26 VITALS — BP 128/80 | HR 49 | Temp 97.7°F

## 2023-03-26 DIAGNOSIS — J069 Acute upper respiratory infection, unspecified: Secondary | ICD-10-CM | POA: Diagnosis not present

## 2023-03-26 DIAGNOSIS — B9689 Other specified bacterial agents as the cause of diseases classified elsewhere: Secondary | ICD-10-CM

## 2023-03-26 DIAGNOSIS — I70221 Atherosclerosis of native arteries of extremities with rest pain, right leg: Secondary | ICD-10-CM | POA: Diagnosis not present

## 2023-03-26 DIAGNOSIS — E782 Mixed hyperlipidemia: Secondary | ICD-10-CM | POA: Diagnosis not present

## 2023-03-26 DIAGNOSIS — R062 Wheezing: Secondary | ICD-10-CM

## 2023-03-26 DIAGNOSIS — R3915 Urgency of urination: Secondary | ICD-10-CM | POA: Diagnosis not present

## 2023-03-26 DIAGNOSIS — R739 Hyperglycemia, unspecified: Secondary | ICD-10-CM | POA: Insufficient documentation

## 2023-03-26 DIAGNOSIS — Z89511 Acquired absence of right leg below knee: Secondary | ICD-10-CM

## 2023-03-26 DIAGNOSIS — R944 Abnormal results of kidney function studies: Secondary | ICD-10-CM

## 2023-03-26 DIAGNOSIS — Z96612 Presence of left artificial shoulder joint: Secondary | ICD-10-CM | POA: Diagnosis not present

## 2023-03-26 DIAGNOSIS — Z96611 Presence of right artificial shoulder joint: Secondary | ICD-10-CM | POA: Diagnosis not present

## 2023-03-26 DIAGNOSIS — R0602 Shortness of breath: Secondary | ICD-10-CM | POA: Diagnosis not present

## 2023-03-26 DIAGNOSIS — M4312 Spondylolisthesis, cervical region: Secondary | ICD-10-CM

## 2023-03-26 DIAGNOSIS — R6889 Other general symptoms and signs: Secondary | ICD-10-CM | POA: Diagnosis not present

## 2023-03-26 LAB — BASIC METABOLIC PANEL
BUN: 10 mg/dL (ref 6–23)
CO2: 28 meq/L (ref 19–32)
Calcium: 8.9 mg/dL (ref 8.4–10.5)
Chloride: 107 meq/L (ref 96–112)
Creatinine, Ser: 0.82 mg/dL (ref 0.40–1.20)
GFR: 72.42 mL/min (ref >60.00)
Glucose, Bld: 98 mg/dL (ref 70–99)
Potassium: 4.7 meq/L (ref 3.5–5.1)
Sodium: 139 meq/L (ref 135–145)

## 2023-03-26 LAB — CBC WITH DIFFERENTIAL/PLATELET
Basophils Absolute: 0.1 10*3/uL (ref 0.0–0.1)
Basophils Relative: 1.1 % (ref 0.0–3.0)
Eosinophils Absolute: 0.2 10*3/uL (ref 0.0–0.7)
Eosinophils Relative: 3.9 % (ref 0.0–5.0)
HCT: 40 % (ref 36.0–46.0)
Hemoglobin: 13 g/dL (ref 12.0–15.0)
Lymphocytes Relative: 27.3 % (ref 12.0–46.0)
Lymphs Abs: 1.6 10*3/uL (ref 0.7–4.0)
MCHC: 32.5 g/dL (ref 30.0–36.0)
MCV: 91.6 fL (ref 78.0–100.0)
Monocytes Absolute: 0.3 10*3/uL (ref 0.1–1.0)
Monocytes Relative: 5.1 % (ref 3.0–12.0)
Neutro Abs: 3.6 10*3/uL (ref 1.4–7.7)
Neutrophils Relative %: 62.6 % (ref 43.0–77.0)
Platelets: 316 10*3/uL (ref 150.0–400.0)
RBC: 4.37 Mil/uL (ref 3.87–5.11)
RDW: 15.3 % (ref 11.5–15.5)
WBC: 5.7 10*3/uL (ref 4.0–10.5)

## 2023-03-26 LAB — LIPID PANEL
Cholesterol: 179 mg/dL (ref 0–200)
HDL: 50.2 mg/dL (ref >39.00)
LDL Cholesterol: 110 mg/dL — ABNORMAL HIGH (ref 0–99)
NonHDL: 128.5
Total CHOL/HDL Ratio: 4
Triglycerides: 93 mg/dL (ref 0.0–149.0)
VLDL: 18.6 mg/dL (ref 0.0–40.0)

## 2023-03-26 LAB — HEMOGLOBIN A1C: Hgb A1c MFr Bld: 5.6 % (ref 4.6–6.5)

## 2023-03-26 MED ORDER — PREDNISONE 20 MG PO TABS
ORAL_TABLET | ORAL | 0 refills | Status: DC
Start: 2023-03-26 — End: 2023-04-11

## 2023-03-26 MED ORDER — AMOXICILLIN-POT CLAVULANATE 875-125 MG PO TABS
1.0000 | ORAL_TABLET | Freq: Two times a day (BID) | ORAL | 0 refills | Status: DC
Start: 2023-03-26 — End: 2023-04-11

## 2023-03-26 MED ORDER — GABAPENTIN 300 MG PO CAPS
ORAL_CAPSULE | ORAL | 3 refills | Status: DC
Start: 2023-03-26 — End: 2023-04-24

## 2023-03-26 NOTE — Progress Notes (Unsigned)
Established Patient Office Visit  Subjective:   Patient ID: Sydney Evans, female    DOB: 07/23/1950  Age: 72 y.o. MRN: [id number]  CC:  Chief Complaint  Patient presents with   Hypertension    HPI: Sydney Evans is a 72 y.o. female presenting on 03/26/2023 for Hypertension  Right BKA base, was wearing prosthesis that was too large for her leg and blisters ended up showing up, and she was treated with rapid dry squares  Pt with cervical radiculopathy, recently seen by Dr. Marney Doctor, orthopedist. She self adjusted her gabapentin to 900 mg TID and at bedtime 600 mg   C/o sinus pressure, chest congestion, increased mucous productive, productive cough that makes it hard for her to breath. Also with sore throat and ear pain. No fever.   Blood pressure today in office stable at 128/80   Acute concerns:  Two of her grand daughters have MS She wants to be tested for this, she states she has a long h/o falls even prior to BKA. She does have have a h/o numbness or weakness in her legs out of the ordinary (as she has a BKA). She had a recent eye exam, and she was trying to focus but failed the test, so she is pending a follow up with them moving forward. She does have glaucoma as well as macular degeneration, so she does also report blurry vision. Does have h/o TIA and has residual hearing loss left ear since. She denies vertigo. 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 Not with memory concerns.She does see Dr. Sherryll Burger a neurologist for bil hand numbness, does not have a f/u scheduled as of yet.        ROS: Negative unless specifically indicated above in HPI.   Relevant past medical history reviewed and updated as indicated.   Allergies and medications reviewed and updated.   Current Outpatient Medications:    acetaminophen (TYLENOL) 500 MG tablet, Take 500 mg by mouth every 6 (six) hours as needed., Disp: , Rfl:    albuterol (VENTOLIN HFA) 108 (90 Base) MCG/ACT inhaler, Inhale  2 puffs into the lungs every 4 (four) hours as needed for shortness of breath., Disp: 1 each, Rfl: 0   amLODipine (NORVASC) 5 MG tablet, Take 1 tablet (5 mg total) by mouth daily., Disp: 90 tablet, Rfl: 3   amoxicillin-clavulanate (AUGMENTIN) 875-125 MG tablet, Take 1 tablet by mouth 2 (two) times daily., Disp: 20 tablet, Rfl: 0   atorvastatin (LIPITOR) 10 MG tablet, Take 1 tablet (10 mg total) by mouth daily., Disp: 90 tablet, Rfl: 3   Carboxymethylcellulose Sodium (LUBRICANT EYE DROPS OP), Place 1 drop into both eyes daily as needed (Dry eye). Systane, Disp: , Rfl:    citalopram (CELEXA) 40 MG tablet, Take 1 tablet (40 mg total) by mouth daily., Disp: 90 tablet, Rfl: 1   diphenoxylate-atropine (LOMOTIL) 2.5-0.025 MG tablet, Take 1-2 tablets by mouth 4 (four) times daily as needed for diarrhea or loose stools (max 8 tabs per day)., Disp: 30 tablet, Rfl: 0   lamoTRIgine (LAMICTAL) 200 MG tablet, Take 1 tablet (200 mg total) by mouth daily., Disp: 90 tablet, Rfl: 0   pantoprazole (PROTONIX) 40 MG tablet, Take 1 tablet (40 mg total) by mouth 2 (two) times daily., Disp: 180 tablet, Rfl: 1   predniSONE (DELTASONE) 20 MG tablet, Take two tablets once daily for five days, Disp: 10 tablet, Rfl: 0   Probiotic Product (FORTIFY PROBIOTIC WOMENS PO), Take 1 Capful by mouth daily., Disp: ,  Rfl:    propranolol (INDERAL) 20 MG tablet, Take 1 tablet (20 mg total) by mouth daily as needed., Disp: 30 tablet, Rfl: 0   valACYclovir (VALTREX) 500 MG tablet, Take 1 tablet (500 mg total) by mouth daily., Disp: 90 tablet, Rfl: 0   gabapentin (NEURONTIN) 300 MG capsule, Take two capsules TID and one capsule QHS, Disp: 210 capsule, Rfl: 3  Allergies  Allergen Reactions   Hydroxyzine Hives and Rash   Bactrim [Sulfamethoxazole-Trimethoprim] Nausea And Vomiting   Dilaudid [Hydromorphone] Itching   Ketorolac Rash     May take with benadryl   Toradol [Ketorolac Tromethamine] Rash    May take with benadryl   Tramadol Hives    Zofran [Ondansetron] Other (See Comments)    Broke mouth out    Objective:   BP 128/80 (BP Location: Left Arm, Patient Position: Sitting, Cuff Size: Normal)   Pulse (!) 49   Temp 97.7 F (36.5 C) (Temporal)   SpO2 97%    Physical Exam  Assessment & Plan:  Hyperglycemia -     Hemoglobin A1c  Mixed hyperlipidemia -     Lipid panel  Decreased GFR -     Basic metabolic panel  Urinary urgency -     Urinalysis w microscopic + reflex cultur; Future  Wheezing -     DG Chest 2 View; Future -     Amoxicillin-Pot Clavulanate; Take 1 tablet by mouth 2 (two) times daily.  Dispense: 20 tablet; Refill: 0 -     CBC with Differential/Platelet -     predniSONE; Take two tablets once daily for five days  Dispense: 10 tablet; Refill: 0  Bacterial upper respiratory infection -     Amoxicillin-Pot Clavulanate; Take 1 tablet by mouth 2 (two) times daily.  Dispense: 20 tablet; Refill: 0  Right below-knee amputee (HCC) -     Gabapentin; Take two capsules TID and one capsule QHS  Dispense: 210 capsule; Refill: 3  Critical limb ischemia of right lower extremity (HCC) -     Gabapentin; Take two capsules TID and one capsule QHS  Dispense: 210 capsule; Refill: 3     Follow up plan: No follow-ups on file.  Mort Sawyers, FNP

## 2023-03-27 ENCOUNTER — Other Ambulatory Visit: Payer: Self-pay | Admitting: Family

## 2023-03-27 ENCOUNTER — Telehealth: Payer: Self-pay | Admitting: Family

## 2023-03-27 DIAGNOSIS — E782 Mixed hyperlipidemia: Secondary | ICD-10-CM

## 2023-03-27 DIAGNOSIS — M47812 Spondylosis without myelopathy or radiculopathy, cervical region: Secondary | ICD-10-CM | POA: Diagnosis not present

## 2023-03-27 DIAGNOSIS — R944 Abnormal results of kidney function studies: Secondary | ICD-10-CM | POA: Insufficient documentation

## 2023-03-27 DIAGNOSIS — D649 Anemia, unspecified: Secondary | ICD-10-CM | POA: Diagnosis not present

## 2023-03-27 DIAGNOSIS — R3915 Urgency of urination: Secondary | ICD-10-CM | POA: Insufficient documentation

## 2023-03-27 DIAGNOSIS — Z89511 Acquired absence of right leg below knee: Secondary | ICD-10-CM | POA: Diagnosis not present

## 2023-03-27 DIAGNOSIS — B9689 Other specified bacterial agents as the cause of diseases classified elsewhere: Secondary | ICD-10-CM | POA: Insufficient documentation

## 2023-03-27 DIAGNOSIS — F319 Bipolar disorder, unspecified: Secondary | ICD-10-CM | POA: Diagnosis not present

## 2023-03-27 DIAGNOSIS — M4312 Spondylolisthesis, cervical region: Secondary | ICD-10-CM | POA: Diagnosis not present

## 2023-03-27 DIAGNOSIS — I10 Essential (primary) hypertension: Secondary | ICD-10-CM | POA: Diagnosis not present

## 2023-03-27 DIAGNOSIS — I70221 Atherosclerosis of native arteries of extremities with rest pain, right leg: Secondary | ICD-10-CM | POA: Diagnosis not present

## 2023-03-27 DIAGNOSIS — E538 Deficiency of other specified B group vitamins: Secondary | ICD-10-CM | POA: Diagnosis not present

## 2023-03-27 DIAGNOSIS — Z4781 Encounter for orthopedic aftercare following surgical amputation: Secondary | ICD-10-CM | POA: Diagnosis not present

## 2023-03-27 MED ORDER — ATORVASTATIN CALCIUM 20 MG PO TABS
20.0000 mg | ORAL_TABLET | Freq: Every day | ORAL | 3 refills | Status: DC
Start: 2023-03-27 — End: 2023-04-11

## 2023-03-27 NOTE — Assessment & Plan Note (Addendum)
Take antibiotic as prescribed. Increase oral fluids. Pt to f/u if sx worsen and or fail to improve in 2-3 days.CXR today to r/o pneumonia and or other bacterial etiology   rx augmentin 875/125 mg po bid x 10 days

## 2023-03-27 NOTE — Telephone Encounter (Signed)
Please call to set up appointment

## 2023-03-27 NOTE — Progress Notes (Signed)
Sydney Evans,  Can you please initiate TOC request to Dr. Ermalene Searing?  Pt unable to get appts with her caregiver due to me not having afternoon appts. I am ok with this.

## 2023-03-27 NOTE — Telephone Encounter (Signed)
Patient has been scheduled

## 2023-03-27 NOTE — Assessment & Plan Note (Signed)
Continue lipitor 40 mg once daily  work on low cholesterol diet and exercise as tolerated

## 2023-03-27 NOTE — Assessment & Plan Note (Signed)
For radiculopathy pt on gabapentin, advised pt not to self increase her gabapentin without consulting with a physician. I discussed I rather she not be on gapapentin at her heightened dose, so we will decrease to gabapentin 300 mg, two capsules TID and one capsule at bedtime. I did give strict precautions for s/e of increased fatigue and drowsiness which will increase risk for falls.

## 2023-03-27 NOTE — Telephone Encounter (Signed)
Mort Sawyers, FNP 03/27/2023  8:58 AM EST     Lillia Abed, Can you please initiate TOC request to Dr. Ermalene Searing? Pt unable to get appts with her caregiver due to me not having afternoon appts. I am ok with this.  ------------------------------------- Dr. Ermalene Searing - please advise if you are okay with this TOC. Thank you!

## 2023-03-31 ENCOUNTER — Encounter: Payer: Self-pay | Admitting: Internal Medicine

## 2023-04-01 DIAGNOSIS — Z89511 Acquired absence of right leg below knee: Secondary | ICD-10-CM | POA: Diagnosis not present

## 2023-04-01 DIAGNOSIS — M4312 Spondylolisthesis, cervical region: Secondary | ICD-10-CM | POA: Diagnosis not present

## 2023-04-01 DIAGNOSIS — F319 Bipolar disorder, unspecified: Secondary | ICD-10-CM | POA: Diagnosis not present

## 2023-04-01 DIAGNOSIS — E538 Deficiency of other specified B group vitamins: Secondary | ICD-10-CM | POA: Diagnosis not present

## 2023-04-01 DIAGNOSIS — Z4781 Encounter for orthopedic aftercare following surgical amputation: Secondary | ICD-10-CM | POA: Diagnosis not present

## 2023-04-01 DIAGNOSIS — D649 Anemia, unspecified: Secondary | ICD-10-CM | POA: Diagnosis not present

## 2023-04-01 DIAGNOSIS — I70221 Atherosclerosis of native arteries of extremities with rest pain, right leg: Secondary | ICD-10-CM | POA: Diagnosis not present

## 2023-04-01 DIAGNOSIS — M47812 Spondylosis without myelopathy or radiculopathy, cervical region: Secondary | ICD-10-CM | POA: Diagnosis not present

## 2023-04-01 DIAGNOSIS — I10 Essential (primary) hypertension: Secondary | ICD-10-CM | POA: Diagnosis not present

## 2023-04-02 DIAGNOSIS — F319 Bipolar disorder, unspecified: Secondary | ICD-10-CM | POA: Diagnosis not present

## 2023-04-02 DIAGNOSIS — Z4781 Encounter for orthopedic aftercare following surgical amputation: Secondary | ICD-10-CM | POA: Diagnosis not present

## 2023-04-02 DIAGNOSIS — M4312 Spondylolisthesis, cervical region: Secondary | ICD-10-CM | POA: Diagnosis not present

## 2023-04-02 DIAGNOSIS — D649 Anemia, unspecified: Secondary | ICD-10-CM | POA: Diagnosis not present

## 2023-04-02 DIAGNOSIS — I10 Essential (primary) hypertension: Secondary | ICD-10-CM | POA: Diagnosis not present

## 2023-04-02 DIAGNOSIS — M47812 Spondylosis without myelopathy or radiculopathy, cervical region: Secondary | ICD-10-CM | POA: Diagnosis not present

## 2023-04-02 DIAGNOSIS — E538 Deficiency of other specified B group vitamins: Secondary | ICD-10-CM | POA: Diagnosis not present

## 2023-04-02 DIAGNOSIS — Z89511 Acquired absence of right leg below knee: Secondary | ICD-10-CM | POA: Diagnosis not present

## 2023-04-02 DIAGNOSIS — I70221 Atherosclerosis of native arteries of extremities with rest pain, right leg: Secondary | ICD-10-CM | POA: Diagnosis not present

## 2023-04-04 NOTE — Progress Notes (Unsigned)
No show

## 2023-04-07 ENCOUNTER — Ambulatory Visit (INDEPENDENT_AMBULATORY_CARE_PROVIDER_SITE_OTHER): Payer: 59 | Admitting: Psychiatry

## 2023-04-07 DIAGNOSIS — Z91199 Patient's noncompliance with other medical treatment and regimen due to unspecified reason: Secondary | ICD-10-CM

## 2023-04-08 ENCOUNTER — Encounter: Payer: Self-pay | Admitting: Internal Medicine

## 2023-04-08 DIAGNOSIS — M4312 Spondylolisthesis, cervical region: Secondary | ICD-10-CM | POA: Diagnosis not present

## 2023-04-08 DIAGNOSIS — Z89511 Acquired absence of right leg below knee: Secondary | ICD-10-CM | POA: Diagnosis not present

## 2023-04-08 DIAGNOSIS — I70221 Atherosclerosis of native arteries of extremities with rest pain, right leg: Secondary | ICD-10-CM | POA: Diagnosis not present

## 2023-04-08 DIAGNOSIS — I10 Essential (primary) hypertension: Secondary | ICD-10-CM | POA: Diagnosis not present

## 2023-04-08 DIAGNOSIS — Z4781 Encounter for orthopedic aftercare following surgical amputation: Secondary | ICD-10-CM | POA: Diagnosis not present

## 2023-04-08 DIAGNOSIS — M47812 Spondylosis without myelopathy or radiculopathy, cervical region: Secondary | ICD-10-CM | POA: Diagnosis not present

## 2023-04-08 DIAGNOSIS — F319 Bipolar disorder, unspecified: Secondary | ICD-10-CM | POA: Diagnosis not present

## 2023-04-08 DIAGNOSIS — D649 Anemia, unspecified: Secondary | ICD-10-CM | POA: Diagnosis not present

## 2023-04-08 DIAGNOSIS — E538 Deficiency of other specified B group vitamins: Secondary | ICD-10-CM | POA: Diagnosis not present

## 2023-04-09 ENCOUNTER — Telehealth: Payer: Self-pay | Admitting: Family

## 2023-04-09 DIAGNOSIS — F319 Bipolar disorder, unspecified: Secondary | ICD-10-CM | POA: Diagnosis not present

## 2023-04-09 DIAGNOSIS — D649 Anemia, unspecified: Secondary | ICD-10-CM | POA: Diagnosis not present

## 2023-04-09 DIAGNOSIS — E538 Deficiency of other specified B group vitamins: Secondary | ICD-10-CM | POA: Diagnosis not present

## 2023-04-09 DIAGNOSIS — M4312 Spondylolisthesis, cervical region: Secondary | ICD-10-CM | POA: Diagnosis not present

## 2023-04-09 DIAGNOSIS — Z4781 Encounter for orthopedic aftercare following surgical amputation: Secondary | ICD-10-CM | POA: Diagnosis not present

## 2023-04-09 DIAGNOSIS — I70221 Atherosclerosis of native arteries of extremities with rest pain, right leg: Secondary | ICD-10-CM | POA: Diagnosis not present

## 2023-04-09 DIAGNOSIS — Z89511 Acquired absence of right leg below knee: Secondary | ICD-10-CM | POA: Diagnosis not present

## 2023-04-09 DIAGNOSIS — M47812 Spondylosis without myelopathy or radiculopathy, cervical region: Secondary | ICD-10-CM | POA: Diagnosis not present

## 2023-04-09 DIAGNOSIS — I10 Essential (primary) hypertension: Secondary | ICD-10-CM | POA: Diagnosis not present

## 2023-04-09 NOTE — Telephone Encounter (Signed)
Home Health verbal orders Caller Name: Morey Hummingbird Agency Name: [facility]  Callback number: [phone]  Requesting PT   Frequency: 1x a week for 9 weeks  Please forward to [facility] pool or providers CMA

## 2023-04-10 DIAGNOSIS — M4312 Spondylolisthesis, cervical region: Secondary | ICD-10-CM | POA: Diagnosis not present

## 2023-04-10 DIAGNOSIS — E538 Deficiency of other specified B group vitamins: Secondary | ICD-10-CM | POA: Diagnosis not present

## 2023-04-10 DIAGNOSIS — M47812 Spondylosis without myelopathy or radiculopathy, cervical region: Secondary | ICD-10-CM | POA: Diagnosis not present

## 2023-04-10 DIAGNOSIS — D649 Anemia, unspecified: Secondary | ICD-10-CM | POA: Diagnosis not present

## 2023-04-10 DIAGNOSIS — F319 Bipolar disorder, unspecified: Secondary | ICD-10-CM | POA: Diagnosis not present

## 2023-04-10 DIAGNOSIS — Z89511 Acquired absence of right leg below knee: Secondary | ICD-10-CM | POA: Diagnosis not present

## 2023-04-10 DIAGNOSIS — Z4781 Encounter for orthopedic aftercare following surgical amputation: Secondary | ICD-10-CM | POA: Diagnosis not present

## 2023-04-10 DIAGNOSIS — I10 Essential (primary) hypertension: Secondary | ICD-10-CM | POA: Diagnosis not present

## 2023-04-10 DIAGNOSIS — I70221 Atherosclerosis of native arteries of extremities with rest pain, right leg: Secondary | ICD-10-CM | POA: Diagnosis not present

## 2023-04-11 ENCOUNTER — Telehealth: Payer: Self-pay | Admitting: Family

## 2023-04-11 ENCOUNTER — Encounter: Payer: Self-pay | Admitting: Family Medicine

## 2023-04-11 ENCOUNTER — Ambulatory Visit (INDEPENDENT_AMBULATORY_CARE_PROVIDER_SITE_OTHER): Payer: Medicare HMO | Admitting: Family Medicine

## 2023-04-11 VITALS — BP 167/67 | HR 62 | Temp 99.5°F

## 2023-04-11 DIAGNOSIS — Z8542 Personal history of malignant neoplasm of other parts of uterus: Secondary | ICD-10-CM

## 2023-04-11 DIAGNOSIS — F319 Bipolar disorder, unspecified: Secondary | ICD-10-CM

## 2023-04-11 DIAGNOSIS — E782 Mixed hyperlipidemia: Secondary | ICD-10-CM

## 2023-04-11 DIAGNOSIS — Z89511 Acquired absence of right leg below knee: Secondary | ICD-10-CM

## 2023-04-11 DIAGNOSIS — D509 Iron deficiency anemia, unspecified: Secondary | ICD-10-CM | POA: Diagnosis not present

## 2023-04-11 DIAGNOSIS — K219 Gastro-esophageal reflux disease without esophagitis: Secondary | ICD-10-CM | POA: Diagnosis not present

## 2023-04-11 DIAGNOSIS — R0609 Other forms of dyspnea: Secondary | ICD-10-CM

## 2023-04-11 DIAGNOSIS — I70221 Atherosclerosis of native arteries of extremities with rest pain, right leg: Secondary | ICD-10-CM

## 2023-04-11 DIAGNOSIS — F419 Anxiety disorder, unspecified: Secondary | ICD-10-CM

## 2023-04-11 DIAGNOSIS — I6521 Occlusion and stenosis of right carotid artery: Secondary | ICD-10-CM

## 2023-04-11 DIAGNOSIS — I6523 Occlusion and stenosis of bilateral carotid arteries: Secondary | ICD-10-CM | POA: Diagnosis not present

## 2023-04-11 DIAGNOSIS — I10 Essential (primary) hypertension: Secondary | ICD-10-CM | POA: Diagnosis not present

## 2023-04-11 DIAGNOSIS — I739 Peripheral vascular disease, unspecified: Secondary | ICD-10-CM

## 2023-04-11 DIAGNOSIS — F17200 Nicotine dependence, unspecified, uncomplicated: Secondary | ICD-10-CM

## 2023-04-11 DIAGNOSIS — R739 Hyperglycemia, unspecified: Secondary | ICD-10-CM

## 2023-04-11 MED ORDER — ATORVASTATIN CALCIUM 40 MG PO TABS: 20.0000 mg | ORAL_TABLET | Freq: Every day | ORAL | 3 refills | Status: DC

## 2023-04-11 MED ORDER — ALBUTEROL SULFATE HFA 108 (90 BASE) MCG/ACT IN AERS: 2.0000 | INHALATION_SPRAY | RESPIRATORY_TRACT | 0 refills | Status: DC | PRN

## 2023-04-11 NOTE — Assessment & Plan Note (Signed)
Chronic, LDL not at goal LDL < 70... on atorvastatin 20 mg    Will increase atorvastatin to 40 mg daily  Re-eval chol in  3 months.

## 2023-04-11 NOTE — Assessment & Plan Note (Signed)
Limb pain controlled with gabapentin 600 mg TID, and 1 at night.

## 2023-04-11 NOTE — Assessment & Plan Note (Signed)
She is precontemplative

## 2023-04-11 NOTE — Assessment & Plan Note (Signed)
Chronic  Followed by Heme.. on iron infusions.

## 2023-04-11 NOTE — Assessment & Plan Note (Signed)
Tolerable control on current regimen.

## 2023-04-11 NOTE — Patient Instructions (Signed)
Increase atorvastatin to 40 mg daily.

## 2023-04-11 NOTE — Assessment & Plan Note (Signed)
Followed by Dr. Lucretia Roers.  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 Per pt dopplers checked q6-12 months.

## 2023-04-11 NOTE — Assessment & Plan Note (Signed)
Stable, chronic.  Continue current medication.   Celexa 40 mg daily  Lamictal 200 mg daily.  Not currently followed by psychiatry.

## 2023-04-11 NOTE — Progress Notes (Signed)
Patient ID: Sydney Evans, female    DOB: 1951-04-29, 72 y.o.   MRN: [id number]  This visit was conducted in person.  BP (!) 167/67 (BP Location: Left Arm, Patient Position: Sitting, Cuff Size: Normal)   Pulse 62   Temp 99.5 F (37.5 C) (Temporal)   SpO2 95%    CC:  Chief Complaint  Patient presents with   Establish Care    TOC From Sydney Evans    Subjective:   HPI: Sydney Evans is a 72 y.o. female presenting on 04/11/2023 for Establish Care (TOC From Sydney Evans)   Previous PCP: Sydney Evans, prior to that [facility], [facility]  Last CPX:  she has not had one in a longtime.. does not want to do them  No recent AMW.. refuse.  Carotid stenosis and PAD.[name] amputation for critical limb ischemia.. followed by vascular closely.  Hypertension:    BP at home usually 126/63  She is upset today BP Readings from Last 3 Encounters:  04/17/23 (!) 162/78  04/11/23 (!) 167/67  03/26/23 128/80  Using medication without problems or lightheadedness:  Chest pain with exertion: Edema: Short of breath: Average home BPs: Other issues:     Lab Results  Component Value Date   CHOL 179 03/26/2023   HDL 50.20 03/26/2023   LDLCALC 110 (H) 03/26/2023   TRIG 93.0 03/26/2023   CHOLHDL 4 03/26/2023     Relevant past medical, surgical, family and social history reviewed and updated as indicated. Interim medical history since our last visit reviewed. Allergies and medications reviewed and updated. Outpatient Medications Prior to Visit  Medication Sig Dispense Refill   acetaminophen (TYLENOL) 500 MG tablet Take 500 mg by mouth every 6 (six) hours as needed.     amLODipine (NORVASC) 5 MG tablet Take 1 tablet (5 mg total) by mouth daily. 90 tablet 3   diphenoxylate-atropine (LOMOTIL) 2.5-0.025 MG tablet Take 1-2 tablets by mouth 4 (four) times daily as needed for diarrhea or loose stools (max 8 tabs per day). 30 tablet 0   ibuprofen (ADVIL) 800 MG tablet Take 800 mg by mouth every 8  (eight) hours as needed.     pantoprazole (PROTONIX) 40 MG tablet Take 1 tablet (40 mg total) by mouth 2 (two) times daily. 180 tablet 1   propranolol (INDERAL) 20 MG tablet Take 1 tablet (20 mg total) by mouth daily as needed. 30 tablet 0   valACYclovir (VALTREX) 500 MG tablet Take 1 tablet (500 mg total) by mouth daily. 90 tablet 0   albuterol (VENTOLIN HFA) 108 (90 Base) MCG/ACT inhaler Inhale 2 puffs into the lungs every 4 (four) hours as needed for shortness of breath. 1 each 0   atorvastatin (LIPITOR) 20 MG tablet Take 1 tablet (20 mg total) by mouth daily. 90 tablet 3   Carboxymethylcellulose Sodium (LUBRICANT EYE DROPS OP) Place 1 drop into both eyes daily as needed (Dry eye). Systane     citalopram (CELEXA) 40 MG tablet Take 1 tablet (40 mg total) by mouth daily. 90 tablet 1   gabapentin (NEURONTIN) 300 MG capsule Take two capsules TID and one capsule QHS 210 capsule 3   lamoTRIgine (LAMICTAL) 200 MG tablet Take 1 tablet (200 mg total) by mouth daily. 90 tablet 0   amoxicillin-clavulanate (AUGMENTIN) 875-125 MG tablet Take 1 tablet by mouth 2 (two) times daily. 20 tablet 0   predniSONE (DELTASONE) 20 MG tablet Take two tablets once daily for five days 10 tablet 0   Probiotic Product (FORTIFY  PROBIOTIC WOMENS PO) Take 1 Capful by mouth daily.     No facility-administered medications prior to visit.     Per HPI unless specifically indicated in ROS section below Review of Systems  Constitutional:  Negative for fatigue and fever.  HENT:  Negative for congestion.   Eyes:  Negative for pain.  Respiratory:  Negative for cough and shortness of breath.   Cardiovascular:  Negative for chest pain, palpitations and leg swelling.  Gastrointestinal:  Negative for abdominal pain.  Genitourinary:  Negative for dysuria and vaginal bleeding.  Musculoskeletal:  Negative for back pain.  Neurological:  Negative for syncope, light-headedness and headaches.  Psychiatric/Behavioral:  Negative for  dysphoric mood.    Objective:  BP (!) 167/67 (BP Location: Left Arm, Patient Position: Sitting, Cuff Size: Normal)   Pulse 62   Temp 99.5 F (37.5 C) (Temporal)   SpO2 95%   Wt Readings from Last 3 Encounters:  03/14/23 167 lb (75.8 kg)  01/31/23 167 lb 5.3 oz (75.9 kg)  12/24/22 161 lb (73 kg)      Physical Exam Constitutional:      General: She is not in acute distress.    Appearance: Normal appearance. She is well-developed. She is not ill-appearing or toxic-appearing.  HENT:     Head: Normocephalic.     Right Ear: Hearing, tympanic membrane, ear canal and external ear normal. Tympanic membrane is not erythematous, retracted or bulging.     Left Ear: Hearing, tympanic membrane, ear canal and external ear normal. Tympanic membrane is not erythematous, retracted or bulging.     Nose: No mucosal edema or rhinorrhea.     Right Sinus: No maxillary sinus tenderness or frontal sinus tenderness.     Left Sinus: No maxillary sinus tenderness or frontal sinus tenderness.     Mouth/Throat:     Pharynx: Uvula midline.  Eyes:     General: Lids are normal. Lids are everted, no foreign bodies appreciated.     Conjunctiva/sclera: Conjunctivae normal.     Pupils: Pupils are equal, round, and reactive to light.  Neck:     Thyroid: No thyroid mass or thyromegaly.     Vascular: No carotid bruit.     Trachea: Trachea normal.  Cardiovascular:     Rate and Rhythm: Normal rate and regular rhythm.     Pulses: Normal pulses.     Heart sounds: Normal heart sounds, S1 normal and S2 normal. No murmur heard.    No friction rub. No gallop.  Pulmonary:     Effort: Pulmonary effort is normal. No tachypnea or respiratory distress.     Breath sounds: Normal breath sounds. No decreased breath sounds, wheezing, rhonchi or rales.  Abdominal:     General: Bowel sounds are normal.     Palpations: Abdomen is soft.     Tenderness: There is no abdominal tenderness.  Musculoskeletal:     Cervical back: Normal  range of motion and neck supple.     Right Lower Extremity: Right leg is amputated below knee.  Skin:    General: Skin is warm and dry.     Findings: No rash.  Neurological:     Mental Status: She is alert.  Psychiatric:        Mood and Affect: Mood is not anxious or depressed.        Speech: Speech normal.        Behavior: Behavior normal. Behavior is cooperative.        Thought Content: Thought  content normal.        Judgment: Judgment normal.       Results for orders placed or performed in visit on 03/26/23  Lipid panel   Collection Time: 03/26/23 11:42 AM  Result Value Ref Range   Cholesterol 179 0 - 200 mg/dL   Triglycerides 19.1 0.0 - 149.0 mg/dL   HDL 47.82 >95.62 mg/dL   VLDL 13.0 0.0 - 86.5 mg/dL   LDL Cholesterol 784 (H) 0 - 99 mg/dL   Total CHOL/HDL Ratio 4    NonHDL 128.50   Hemoglobin A1c   Collection Time: 03/26/23 11:42 AM  Result Value Ref Range   Hgb A1c MFr Bld 5.6 4.6 - 6.5 %  Basic metabolic panel   Collection Time: 03/26/23 11:42 AM  Result Value Ref Range   Sodium 139 135 - 145 mEq/L   Potassium 4.7 3.5 - 5.1 mEq/L   Chloride 107 96 - 112 mEq/L   CO2 28 19 - 32 mEq/L   Glucose, Bld 98 70 - 99 mg/dL   BUN 10 6 - 23 mg/dL   Creatinine, Ser 6.96 0.40 - 1.20 mg/dL   GFR 29.52 >84.13 mL/min   Calcium 8.9 8.4 - 10.5 mg/dL  CBC with Differential   Collection Time: 03/26/23 11:42 AM  Result Value Ref Range   WBC 5.7 4.0 - 10.5 K/uL   RBC 4.37 3.87 - 5.11 Mil/uL   Hemoglobin 13.0 12.0 - 15.0 g/dL   HCT 24.4 01.0 - 27.2 %   MCV 91.6 78.0 - 100.0 fl   MCHC 32.5 30.0 - 36.0 g/dL   RDW 53.6 64.4 - 03.4 %   Platelets 316.0 150.0 - 400.0 K/uL   Neutrophils Relative % 62.6 43.0 - 77.0 %   Lymphocytes Relative 27.3 12.0 - 46.0 %   Monocytes Relative 5.1 3.0 - 12.0 %   Eosinophils Relative 3.9 0.0 - 5.0 %   Basophils Relative 1.1 0.0 - 3.0 %   Neutro Abs 3.6 1.4 - 7.7 K/uL   Lymphs Abs 1.6 0.7 - 4.0 K/uL   Monocytes Absolute 0.3 0.1 - 1.0 K/uL    Eosinophils Absolute 0.2 0.0 - 0.7 K/uL   Basophils Absolute 0.1 0.0 - 0.1 K/uL    Assessment and Plan  Bilateral carotid artery stenosis Assessment & Plan:  Followed by Dr. Lucretia Roers.  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 Per pt dopplers checked q6-12 months.   Right below-knee amputee [facility]) Assessment & Plan:  Secondary to PAD   Critical limb ischemia of right lower extremity (HCC) Assessment & Plan:  Secondary to PAD   History of uterine cancer Assessment & Plan:  S/P  TAH  age 53.   Gastroesophageal reflux disease without esophagitis Assessment & Plan: Stable, chronic.  Continue current medication.   Protonix 40 mg  BID    Hx of Barett's Esophagitits.   Followed by GI Dr. Mia Creek, Gavin Potters.    Essential hypertension Assessment & Plan: Stable, chronic.  Continue current medication.  Amlodipine 5 mg daily  Propranolol 20 mg  prn.. does not use.   Mixed hyperlipidemia Assessment & Plan:  Chronic, LDL not at goal LDL < 70... on atorvastatin 20 mg    Will increase atorvastatin to 40 mg daily  Re-eval chol in  3 months.  Orders: -     Atorvastatin Calcium; Take 0.5 tablets (20 mg total) by mouth daily.  Dispense: 90 tablet; Refill: 3  Iron deficiency anemia, unspecified iron deficiency anemia type Assessment &  Plan:  Chronic  Followed by Heme.. on iron infusions.    Bipolar depression (HCC) Assessment & Plan:  Stable, chronic.  Continue current medication.   Celexa 40 mg daily  Lamictal 200 mg daily.  Not currently followed by psychiatry.   Anxiety Assessment & Plan: Tolerable control on current regimen   PAD (peripheral artery disease) (HCC) Assessment & Plan:  On Statin.. followed by vascular.   Hyperglycemia Assessment & Plan: Lab Results  Component Value Date   HGBA1C 5.6 03/26/2023      Right internal carotid occlusion Assessment & Plan:  Follwoed by vascualr.   Tobacco use disorder Assessment & Plan:   She is precontemplative   DOE (dyspnea on exertion) -     Albuterol Sulfate HFA; Inhale 2 puffs into the lungs every 4 (four) hours as needed for shortness of breath.  Dispense: 1 each; Refill: 0  Acquired absence of right lower extremity below knee [facility]) Assessment & Plan: Limb pain controlled with gabapentin 600 mg TID, and 1 at night.     Return in about 3 months (around 07/10/2023) for  follow up with labs prior  CMET and chol.Kerby Nora, MD

## 2023-04-11 NOTE — Assessment & Plan Note (Signed)
Follwoed by vascualr.

## 2023-04-11 NOTE — Assessment & Plan Note (Signed)
S/P  TAH  age 72.

## 2023-04-11 NOTE — Assessment & Plan Note (Signed)
Secondary to PAD

## 2023-04-11 NOTE — Assessment & Plan Note (Signed)
Stable, chronic.  Continue current medication.  Amlodipine 5 mg daily  Propranolol 20 mg  prn.. does not use.

## 2023-04-11 NOTE — Telephone Encounter (Signed)
VO given to Connie 

## 2023-04-11 NOTE — Telephone Encounter (Signed)
Home Health verbal orders Caller Name: Junious Dresser  Agency Name: [facility]  Callback number: [phone], secured   Requesting OT & HH aid   Reason: bathing (hh aid)  Frequency: 1 x 8   Please forward to [facility] pool or providers CMA

## 2023-04-11 NOTE — Assessment & Plan Note (Signed)
Lab Results  Component Value Date   HGBA1C 5.6 03/26/2023

## 2023-04-11 NOTE — Assessment & Plan Note (Signed)
Stable, chronic.  Continue current medication.   Protonix 40 mg  BID    Hx of Barett's Esophagitits.   Followed by GI Dr. Mia Creek, Gavin Potters.

## 2023-04-11 NOTE — Assessment & Plan Note (Signed)
On Statin.. followed by vascular.

## 2023-04-11 NOTE — Telephone Encounter (Signed)
LM for Thu to return call.

## 2023-04-14 DIAGNOSIS — I10 Essential (primary) hypertension: Secondary | ICD-10-CM | POA: Diagnosis not present

## 2023-04-14 DIAGNOSIS — D649 Anemia, unspecified: Secondary | ICD-10-CM | POA: Diagnosis not present

## 2023-04-14 DIAGNOSIS — Z89511 Acquired absence of right leg below knee: Secondary | ICD-10-CM | POA: Diagnosis not present

## 2023-04-14 DIAGNOSIS — M47812 Spondylosis without myelopathy or radiculopathy, cervical region: Secondary | ICD-10-CM | POA: Diagnosis not present

## 2023-04-14 DIAGNOSIS — Z4781 Encounter for orthopedic aftercare following surgical amputation: Secondary | ICD-10-CM | POA: Diagnosis not present

## 2023-04-14 DIAGNOSIS — F319 Bipolar disorder, unspecified: Secondary | ICD-10-CM | POA: Diagnosis not present

## 2023-04-14 DIAGNOSIS — E538 Deficiency of other specified B group vitamins: Secondary | ICD-10-CM | POA: Diagnosis not present

## 2023-04-14 DIAGNOSIS — M4312 Spondylolisthesis, cervical region: Secondary | ICD-10-CM | POA: Diagnosis not present

## 2023-04-14 DIAGNOSIS — I70221 Atherosclerosis of native arteries of extremities with rest pain, right leg: Secondary | ICD-10-CM | POA: Diagnosis not present

## 2023-04-15 DIAGNOSIS — I70221 Atherosclerosis of native arteries of extremities with rest pain, right leg: Secondary | ICD-10-CM | POA: Diagnosis not present

## 2023-04-15 DIAGNOSIS — Z4781 Encounter for orthopedic aftercare following surgical amputation: Secondary | ICD-10-CM | POA: Diagnosis not present

## 2023-04-15 DIAGNOSIS — E538 Deficiency of other specified B group vitamins: Secondary | ICD-10-CM | POA: Diagnosis not present

## 2023-04-15 DIAGNOSIS — D649 Anemia, unspecified: Secondary | ICD-10-CM | POA: Diagnosis not present

## 2023-04-15 DIAGNOSIS — Z89511 Acquired absence of right leg below knee: Secondary | ICD-10-CM | POA: Diagnosis not present

## 2023-04-15 DIAGNOSIS — F319 Bipolar disorder, unspecified: Secondary | ICD-10-CM | POA: Diagnosis not present

## 2023-04-15 DIAGNOSIS — M47812 Spondylosis without myelopathy or radiculopathy, cervical region: Secondary | ICD-10-CM | POA: Diagnosis not present

## 2023-04-15 DIAGNOSIS — M4312 Spondylolisthesis, cervical region: Secondary | ICD-10-CM | POA: Diagnosis not present

## 2023-04-15 DIAGNOSIS — I10 Essential (primary) hypertension: Secondary | ICD-10-CM | POA: Diagnosis not present

## 2023-04-16 ENCOUNTER — Telehealth: Payer: Self-pay | Admitting: Family Medicine

## 2023-04-16 NOTE — Telephone Encounter (Signed)
I spoke with Sydney Evans; Sydney Evans established care with Dr Ermalene Searing on 04/11/23; starting on 04/13/23 Sydney Evans has boil perineal area between vagina and rectum approx size of a quarter. No drainage but Sydney Evans said very swollen. ? Fever. Sydney Evans scheduled appt with Dr Ermalene Searing on 04/17/23 at 2:40 with UC & ED precautions given and Sydney Evans voiced understanding. . Sydney Evans will discuss refills at appt. Sending note to Dr Ermalene Searing.

## 2023-04-16 NOTE — Telephone Encounter (Signed)
Noted  

## 2023-04-16 NOTE — Telephone Encounter (Signed)
Pt called in stating she never the muscle relaxer's or pads that Dr. Ermalene Searing stated she'd prescribe. Pt also mentioned she need a antibiotic for some boils she has. Pt requested a call back from nurse. Call back # [phone]

## 2023-04-16 NOTE — Telephone Encounter (Signed)
Please triage

## 2023-04-17 ENCOUNTER — Ambulatory Visit: Payer: Medicare HMO | Admitting: Family Medicine

## 2023-04-17 ENCOUNTER — Encounter: Payer: Self-pay | Admitting: Family Medicine

## 2023-04-17 VITALS — BP 162/78 | HR 56 | Ht 61.0 in

## 2023-04-17 DIAGNOSIS — L02215 Cutaneous abscess of perineum: Secondary | ICD-10-CM | POA: Diagnosis not present

## 2023-04-17 DIAGNOSIS — Z89511 Acquired absence of right leg below knee: Secondary | ICD-10-CM | POA: Diagnosis not present

## 2023-04-17 DIAGNOSIS — F319 Bipolar disorder, unspecified: Secondary | ICD-10-CM | POA: Diagnosis not present

## 2023-04-17 DIAGNOSIS — M47812 Spondylosis without myelopathy or radiculopathy, cervical region: Secondary | ICD-10-CM | POA: Diagnosis not present

## 2023-04-17 DIAGNOSIS — M4312 Spondylolisthesis, cervical region: Secondary | ICD-10-CM | POA: Diagnosis not present

## 2023-04-17 DIAGNOSIS — E538 Deficiency of other specified B group vitamins: Secondary | ICD-10-CM | POA: Diagnosis not present

## 2023-04-17 DIAGNOSIS — I70221 Atherosclerosis of native arteries of extremities with rest pain, right leg: Secondary | ICD-10-CM | POA: Diagnosis not present

## 2023-04-17 DIAGNOSIS — I10 Essential (primary) hypertension: Secondary | ICD-10-CM | POA: Diagnosis not present

## 2023-04-17 DIAGNOSIS — Z4781 Encounter for orthopedic aftercare following surgical amputation: Secondary | ICD-10-CM | POA: Diagnosis not present

## 2023-04-17 DIAGNOSIS — D649 Anemia, unspecified: Secondary | ICD-10-CM | POA: Diagnosis not present

## 2023-04-17 MED ORDER — CARBOXYMETHYLCELLULOSE SODIUM 0.5 % OP SOLN: 2.0000 [drp] | Freq: Every day | OPHTHALMIC | 3 refills | Status: AC | PRN

## 2023-04-17 MED ORDER — DOXYCYCLINE HYCLATE 100 MG PO TABS: 100.0000 mg | ORAL_TABLET | Freq: Two times a day (BID) | ORAL | 0 refills | Status: DC

## 2023-04-17 MED ORDER — METHOCARBAMOL 500 MG PO TABS: 500.0000 mg | ORAL_TABLET | Freq: Two times a day (BID) | ORAL | 3 refills | Status: DC

## 2023-04-17 NOTE — Progress Notes (Signed)
Patient ID: Sydney Evans, female    DOB: Nov 11, 1950, 72 y.o.   MRN: [id number]  This visit was conducted in person.  BP (!) 162/78   Pulse (!) 56   Ht 5\' 1"  (1.549 m)   SpO2 96%   BMI 31.55 kg/m    CC:  Chief Complaint  Patient presents with   Cyst    C/o perineal boil. Started about 4 days ago.     Subjective:   HPI: Sydney Evans is a 72 y.o. female presenting on 04/17/2023 for Cyst (C/o perineal boil. Started about 4 days ago. )  She  has noted  sore raised boil in perineum between vagina and rectum. Noted first 4 days ago, increasing in size, mini meatball size  No drainage.  No fever.    History of recurrent  boils.[name] on abdomen and breast, thighs.  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  Relevant past medical, surgical, family and social history reviewed and updated as indicated. Interim medical history since our last visit reviewed. Allergies and medications reviewed and updated. Outpatient Medications Prior to Visit  Medication Sig Dispense Refill   acetaminophen (TYLENOL) 500 MG tablet Take 500 mg by mouth every 6 (six) hours as needed.     albuterol (VENTOLIN HFA) 108 (90 Base) MCG/ACT inhaler Inhale 2 puffs into the lungs every 4 (four) hours as needed for shortness of breath. 1 each 0   amLODipine (NORVASC) 5 MG tablet Take 1 tablet (5 mg total) by mouth daily. 90 tablet 3   atorvastatin (LIPITOR) 40 MG tablet Take 0.5 tablets (20 mg total) by mouth daily. 90 tablet 3   citalopram (CELEXA) 40 MG tablet Take 1 tablet (40 mg total) by mouth daily. 90 tablet 1   diphenoxylate-atropine (LOMOTIL) 2.5-0.025 MG tablet Take 1-2 tablets by mouth 4 (four) times daily as needed for diarrhea or loose stools (max 8 tabs per day). 30 tablet 0   gabapentin (NEURONTIN) 300 MG capsule Take two capsules TID and one capsule QHS 210 capsule 3   ibuprofen (ADVIL) 800 MG tablet Take 800 mg by mouth every 8 (eight) hours as needed.     lamoTRIgine (LAMICTAL) 200 MG  tablet Take 1 tablet (200 mg total) by mouth daily. 90 tablet 0   pantoprazole (PROTONIX) 40 MG tablet Take 1 tablet (40 mg total) by mouth 2 (two) times daily. 180 tablet 1   propranolol (INDERAL) 20 MG tablet Take 1 tablet (20 mg total) by mouth daily as needed. 30 tablet 0   valACYclovir (VALTREX) 500 MG tablet Take 1 tablet (500 mg total) by mouth daily. 90 tablet 0   Carboxymethylcellulose Sodium (LUBRICANT EYE DROPS OP) Place 1 drop into both eyes daily as needed (Dry eye). Systane     No facility-administered medications prior to visit.     Per HPI unless specifically indicated in ROS section below Review of Systems  Constitutional:  Negative for fatigue and fever.  HENT:  Negative for congestion.   Eyes:  Negative for pain.  Respiratory:  Negative for cough and shortness of breath.   Cardiovascular:  Negative for chest pain, palpitations and leg swelling.  Gastrointestinal:  Negative for abdominal pain.  Genitourinary:  Negative for dysuria and vaginal bleeding.  Musculoskeletal:  Negative for back pain.  Neurological:  Negative for syncope, light-headedness and headaches.  Psychiatric/Behavioral:  Negative for dysphoric mood.    Objective:  BP (!) 162/78   Pulse (!) 56  Ht 5\' 1"  (1.549 m)   SpO2 96%   BMI 31.55 kg/m   Wt Readings from Last 3 Encounters:  03/14/23 167 lb (75.8 kg)  01/31/23 167 lb 5.3 oz (75.9 kg)  12/24/22 161 lb (73 kg)      Physical Exam Constitutional:      General: She is not in acute distress.    Appearance: Normal appearance. She is well-developed. She is not ill-appearing or toxic-appearing.  HENT:     Head: Normocephalic.     Right Ear: Hearing, tympanic membrane, ear canal and external ear normal. Tympanic membrane is not erythematous, retracted or bulging.     Left Ear: Hearing, tympanic membrane, ear canal and external ear normal. Tympanic membrane is not erythematous, retracted or bulging.     Nose: No mucosal edema or rhinorrhea.      Right Sinus: No maxillary sinus tenderness or frontal sinus tenderness.     Left Sinus: No maxillary sinus tenderness or frontal sinus tenderness.     Mouth/Throat:     Pharynx: Uvula midline.  Eyes:     General: Lids are normal. Lids are everted, no foreign bodies appreciated.     Conjunctiva/sclera: Conjunctivae normal.     Pupils: Pupils are equal, round, and reactive to light.  Neck:     Thyroid: No thyroid mass or thyromegaly.     Vascular: No carotid bruit.     Trachea: Trachea normal.  Cardiovascular:     Rate and Rhythm: Normal rate and regular rhythm.     Pulses: Normal pulses.     Heart sounds: Normal heart sounds, S1 normal and S2 normal. No murmur heard.    No friction rub. No gallop.  Pulmonary:     Effort: Pulmonary effort is normal. No tachypnea or respiratory distress.     Breath sounds: Normal breath sounds. No decreased breath sounds, wheezing, rhonchi or rales.  Abdominal:     General: Bowel sounds are normal.     Palpations: Abdomen is soft.     Tenderness: There is no abdominal tenderness.  Musculoskeletal:     Cervical back: Normal range of motion and neck supple.  Skin:    General: Skin is warm and dry.     Findings: No rash.     Comments: Pustule with 3 cm surrounding erythema, tender to palpation in the left perineum.  Neurological:     Mental Status: She is alert.  Psychiatric:        Mood and Affect: Mood is not anxious or depressed.        Speech: Speech normal.        Behavior: Behavior normal. Behavior is cooperative.        Thought Content: Thought content normal.        Judgment: Judgment normal.       Results for orders placed or performed in visit on 03/26/23  Lipid panel   Collection Time: 03/26/23 11:42 AM  Result Value Ref Range   Cholesterol 179 0 - 200 mg/dL   Triglycerides 35.5 0.0 - 149.0 mg/dL   HDL 73.22 >02.54 mg/dL   VLDL 27.0 0.0 - 62.3 mg/dL   LDL Cholesterol 762 (H) 0 - 99 mg/dL   Total CHOL/HDL Ratio 4    NonHDL  128.50   Hemoglobin A1c   Collection Time: 03/26/23 11:42 AM  Result Value Ref Range   Hgb A1c MFr Bld 5.6 4.6 - 6.5 %  Basic metabolic panel   Collection Time: 03/26/23 11:42 AM  Result Value Ref Range   Sodium 139 135 - 145 mEq/L   Potassium 4.7 3.5 - 5.1 mEq/L   Chloride 107 96 - 112 mEq/L   CO2 28 19 - 32 mEq/L   Glucose, Bld 98 70 - 99 mg/dL   BUN 10 6 - 23 mg/dL   Creatinine, Ser 1.61 0.40 - 1.20 mg/dL   GFR 09.60 >45.40 mL/min   Calcium 8.9 8.4 - 10.5 mg/dL  CBC with Differential   Collection Time: 03/26/23 11:42 AM  Result Value Ref Range   WBC 5.7 4.0 - 10.5 K/uL   RBC 4.37 3.87 - 5.11 Mil/uL   Hemoglobin 13.0 12.0 - 15.0 g/dL   HCT 98.1 19.1 - 47.8 %   MCV 91.6 78.0 - 100.0 fl   MCHC 32.5 30.0 - 36.0 g/dL   RDW 29.5 62.1 - 30.8 %   Platelets 316.0 150.0 - 400.0 K/uL   Neutrophils Relative % 62.6 43.0 - 77.0 %   Lymphocytes Relative 27.3 12.0 - 46.0 %   Monocytes Relative 5.1 3.0 - 12.0 %   Eosinophils Relative 3.9 0.0 - 5.0 %   Basophils Relative 1.1 0.0 - 3.0 %   Neutro Abs 3.6 1.4 - 7.7 K/uL   Lymphs Abs 1.6 0.7 - 4.0 K/uL   Monocytes Absolute 0.3 0.1 - 1.0 K/uL   Eosinophils Absolute 0.2 0.0 - 0.7 K/uL   Basophils Absolute 0.1 0.0 - 0.1 K/uL    Assessment and Plan  Perineal abscess  Other orders -     Methocarbamol; Take 1 tablet (500 mg total) by mouth 2 (two) times daily.  Dispense: 40 tablet; Refill: 3 -     Carboxymethylcellulose Sodium; Place 2 drops into both eyes daily as needed (Dry eye). Systane  Dispense: 30 mL; Refill: 3    No follow-ups on file.   Kerby Nora, MD

## 2023-04-18 DIAGNOSIS — M810 Age-related osteoporosis without current pathological fracture: Secondary | ICD-10-CM

## 2023-04-18 DIAGNOSIS — M4312 Spondylolisthesis, cervical region: Secondary | ICD-10-CM | POA: Diagnosis not present

## 2023-04-18 DIAGNOSIS — H4010X0 Unspecified open-angle glaucoma, stage unspecified: Secondary | ICD-10-CM

## 2023-04-18 DIAGNOSIS — Z4781 Encounter for orthopedic aftercare following surgical amputation: Secondary | ICD-10-CM | POA: Diagnosis not present

## 2023-04-18 DIAGNOSIS — D649 Anemia, unspecified: Secondary | ICD-10-CM | POA: Diagnosis not present

## 2023-04-18 DIAGNOSIS — F319 Bipolar disorder, unspecified: Secondary | ICD-10-CM | POA: Diagnosis not present

## 2023-04-18 DIAGNOSIS — E538 Deficiency of other specified B group vitamins: Secondary | ICD-10-CM | POA: Diagnosis not present

## 2023-04-18 DIAGNOSIS — I10 Essential (primary) hypertension: Secondary | ICD-10-CM | POA: Diagnosis not present

## 2023-04-18 DIAGNOSIS — M47812 Spondylosis without myelopathy or radiculopathy, cervical region: Secondary | ICD-10-CM | POA: Diagnosis not present

## 2023-04-18 DIAGNOSIS — I70221 Atherosclerosis of native arteries of extremities with rest pain, right leg: Secondary | ICD-10-CM | POA: Diagnosis not present

## 2023-04-18 DIAGNOSIS — F5222 Female sexual arousal disorder: Secondary | ICD-10-CM

## 2023-04-18 DIAGNOSIS — Z89511 Acquired absence of right leg below knee: Secondary | ICD-10-CM | POA: Diagnosis not present

## 2023-04-21 DIAGNOSIS — D649 Anemia, unspecified: Secondary | ICD-10-CM | POA: Diagnosis not present

## 2023-04-21 DIAGNOSIS — Z89511 Acquired absence of right leg below knee: Secondary | ICD-10-CM | POA: Diagnosis not present

## 2023-04-21 DIAGNOSIS — E538 Deficiency of other specified B group vitamins: Secondary | ICD-10-CM | POA: Diagnosis not present

## 2023-04-21 DIAGNOSIS — M47812 Spondylosis without myelopathy or radiculopathy, cervical region: Secondary | ICD-10-CM | POA: Diagnosis not present

## 2023-04-21 DIAGNOSIS — F319 Bipolar disorder, unspecified: Secondary | ICD-10-CM | POA: Diagnosis not present

## 2023-04-21 DIAGNOSIS — M4312 Spondylolisthesis, cervical region: Secondary | ICD-10-CM | POA: Diagnosis not present

## 2023-04-21 DIAGNOSIS — Z4781 Encounter for orthopedic aftercare following surgical amputation: Secondary | ICD-10-CM | POA: Diagnosis not present

## 2023-04-21 DIAGNOSIS — I10 Essential (primary) hypertension: Secondary | ICD-10-CM | POA: Diagnosis not present

## 2023-04-21 DIAGNOSIS — I70221 Atherosclerosis of native arteries of extremities with rest pain, right leg: Secondary | ICD-10-CM | POA: Diagnosis not present

## 2023-04-22 DIAGNOSIS — Z89511 Acquired absence of right leg below knee: Secondary | ICD-10-CM | POA: Diagnosis not present

## 2023-04-22 DIAGNOSIS — Z4781 Encounter for orthopedic aftercare following surgical amputation: Secondary | ICD-10-CM | POA: Diagnosis not present

## 2023-04-22 DIAGNOSIS — M47812 Spondylosis without myelopathy or radiculopathy, cervical region: Secondary | ICD-10-CM | POA: Diagnosis not present

## 2023-04-22 DIAGNOSIS — F319 Bipolar disorder, unspecified: Secondary | ICD-10-CM | POA: Diagnosis not present

## 2023-04-22 DIAGNOSIS — E538 Deficiency of other specified B group vitamins: Secondary | ICD-10-CM | POA: Diagnosis not present

## 2023-04-22 DIAGNOSIS — D649 Anemia, unspecified: Secondary | ICD-10-CM | POA: Diagnosis not present

## 2023-04-22 DIAGNOSIS — M4312 Spondylolisthesis, cervical region: Secondary | ICD-10-CM | POA: Diagnosis not present

## 2023-04-22 DIAGNOSIS — I10 Essential (primary) hypertension: Secondary | ICD-10-CM | POA: Diagnosis not present

## 2023-04-22 DIAGNOSIS — I70221 Atherosclerosis of native arteries of extremities with rest pain, right leg: Secondary | ICD-10-CM | POA: Diagnosis not present

## 2023-04-24 ENCOUNTER — Encounter: Payer: Self-pay | Admitting: Family Medicine

## 2023-04-24 ENCOUNTER — Other Ambulatory Visit: Payer: Self-pay | Admitting: Psychiatry

## 2023-04-24 ENCOUNTER — Other Ambulatory Visit: Payer: Self-pay

## 2023-04-24 ENCOUNTER — Other Ambulatory Visit: Payer: Self-pay | Admitting: Family Medicine

## 2023-04-24 DIAGNOSIS — Z89511 Acquired absence of right leg below knee: Secondary | ICD-10-CM | POA: Diagnosis not present

## 2023-04-24 DIAGNOSIS — I70221 Atherosclerosis of native arteries of extremities with rest pain, right leg: Secondary | ICD-10-CM

## 2023-04-24 DIAGNOSIS — E538 Deficiency of other specified B group vitamins: Secondary | ICD-10-CM | POA: Diagnosis not present

## 2023-04-24 DIAGNOSIS — F319 Bipolar disorder, unspecified: Secondary | ICD-10-CM

## 2023-04-24 DIAGNOSIS — M47812 Spondylosis without myelopathy or radiculopathy, cervical region: Secondary | ICD-10-CM | POA: Diagnosis not present

## 2023-04-24 DIAGNOSIS — M4312 Spondylolisthesis, cervical region: Secondary | ICD-10-CM | POA: Diagnosis not present

## 2023-04-24 DIAGNOSIS — I10 Essential (primary) hypertension: Secondary | ICD-10-CM | POA: Diagnosis not present

## 2023-04-24 DIAGNOSIS — Z4781 Encounter for orthopedic aftercare following surgical amputation: Secondary | ICD-10-CM | POA: Diagnosis not present

## 2023-04-24 DIAGNOSIS — D649 Anemia, unspecified: Secondary | ICD-10-CM | POA: Diagnosis not present

## 2023-04-24 MED ORDER — GABAPENTIN 300 MG PO CAPS: ORAL_CAPSULE | ORAL | 3 refills | Status: DC

## 2023-04-24 MED ORDER — CITALOPRAM HYDROBROMIDE 40 MG PO TABS: 40.0000 mg | ORAL_TABLET | Freq: Every day | ORAL | 1 refills | Status: DC

## 2023-04-24 MED ORDER — FLUTICASONE PROPIONATE 50 MCG/ACT NA SUSP: 1.0000 | Freq: Every day | NASAL | 5 refills | Status: DC | PRN

## 2023-04-24 NOTE — Telephone Encounter (Signed)
Last office visit  12/12/204 for perineal abscess.  Citalopram last refilled 11/05/2022 for #90 with 1 refill.  It was refilled by Dr. Vanetta Shawl.  Okay to refill? There should be available refills on the Gabapentin at her pharmacy.  Refilled 03/26/2023 for #210 with 3 refills by T. Dugal.

## 2023-04-24 NOTE — Telephone Encounter (Signed)
Flonase request already sent in another phone note. Latanoprost is not on current medication list but is listed in her historical medications.

## 2023-04-24 NOTE — Telephone Encounter (Signed)
Copied from CRM [id number]. Topic: Clinical - Medication Question >> Apr 24, 2023 10:28 AM Sydney Evans wrote: Reason for CRM: Patient called in requesting refill on nasal spray, but not listed in medication list  Please call pt to get more information.

## 2023-04-24 NOTE — Telephone Encounter (Signed)
The refill has been ordered as requested. Please contact the patient to schedule a follow-up visit.

## 2023-04-24 NOTE — Telephone Encounter (Addendum)
Spoke with Sydney Evans.  She is requesting refill for Flonase nasal spray not listed on her current medication list.  It is listed in her historical meds. Okay to refill.

## 2023-04-24 NOTE — Addendum Note (Signed)
Addended by: Damita Lack on: 04/24/2023 11:21 AM   Modules accepted: Orders

## 2023-04-24 NOTE — Telephone Encounter (Signed)
Copied from CRM [id number]. Topic: Clinical - Medication Refill >> Apr 24, 2023 10:25 AM Deaijah H wrote: Most Recent Primary Care Visit:  Provider: Kerby Nora E  Department: [facility]  Visit Type: OFFICE VISIT  Date: 04/17/2023  Medication: citalopram (CELEXA) 40 MG tablet/gabapentin (NEURONTIN) 300 MG capsule  Has the patient contacted their pharmacy?  (Agent: If no, request that the patient contact the pharmacy for the refill. If patient does not wish to contact the pharmacy document the reason why and proceed with request.) (Agent: If yes, when and what did the pharmacy advise?)  Is this the correct pharmacy for this prescription? Yes If no, delete pharmacy and type the correct one.  This is the patient's preferred pharmacy:  [facility] [address] ([address]), [place] - [address] [place]) [place] [address] Phone: [phone] Fax: [phone]   Has the prescription been filled recently? No  Is the patient out of the medication? Yes  Has the patient been seen for an appointment in the last year OR does the patient have an upcoming appointment?   Can we respond through MyChart?   Agent: Please be advised that Rx refills may take up to 3 business days. We ask that you follow-up with your pharmacy.

## 2023-04-25 ENCOUNTER — Telehealth: Payer: Self-pay | Admitting: *Deleted

## 2023-04-25 DIAGNOSIS — Z89511 Acquired absence of right leg below knee: Secondary | ICD-10-CM

## 2023-04-25 DIAGNOSIS — I70221 Atherosclerosis of native arteries of extremities with rest pain, right leg: Secondary | ICD-10-CM

## 2023-04-25 MED ORDER — LATANOPROST 0.005 % OP SOLN
1.0000 [drp] | Freq: Every day | OPHTHALMIC | 5 refills | Status: DC
Start: 2023-04-25 — End: 2023-09-11

## 2023-04-25 MED ORDER — GABAPENTIN 300 MG PO CAPS: ORAL_CAPSULE | ORAL | 3 refills | Status: DC

## 2023-04-25 NOTE — Telephone Encounter (Signed)
Ms. Resto notified as instructed by telephone.  Patient states she has had improvement on the higher dose.

## 2023-04-25 NOTE — Telephone Encounter (Signed)
Notify the patient that if she has had significant improvement with the higher dose of gabapentin I am fine with her taking gabapentin 300 mg 2 capsules 3 times daily and 2 capsules at bedtime.  I have sent in the prescription reflecting this higher dose.

## 2023-04-25 NOTE — Telephone Encounter (Signed)
Copied from CRM [id number]. Topic: Clinical - Medication Question >> Apr 24, 2023  5:24 PM Sonny Dandy B wrote: Reason for CRM:pt  called regarding medication. Pt would like the provider to increase her medication.   gabapentin (NEURONTIN) 300 MG capsule Pt is requesting a call back. P

## 2023-04-25 NOTE — Telephone Encounter (Signed)
I spoke with pt; pt said [facility] [address] told pt too early to fill gabapentin. Next fill date is 04/27/23. Pt said when she saw T Dugal FNP on 03/26/23 pt understood she was to take gabapentin 300 mg two capsule tid and two capsules at bedtime. I advised pt per office note pt was to take gabapentin 300 mg two caps tid and one cap at hs. Pt said she did not understand the instructions that way. Lurena Joiner at [facility] [address] said can fill early if permission given by provider. Pt request cb after reviewed by Dr Ermalene Searing; pt said she is out of med. Sending note to Dr Ermalene Searing and [place] [facility].

## 2023-04-27 DIAGNOSIS — E538 Deficiency of other specified B group vitamins: Secondary | ICD-10-CM | POA: Diagnosis not present

## 2023-04-27 DIAGNOSIS — I10 Essential (primary) hypertension: Secondary | ICD-10-CM | POA: Diagnosis not present

## 2023-04-27 DIAGNOSIS — M4312 Spondylolisthesis, cervical region: Secondary | ICD-10-CM | POA: Diagnosis not present

## 2023-04-27 DIAGNOSIS — D649 Anemia, unspecified: Secondary | ICD-10-CM | POA: Diagnosis not present

## 2023-04-27 DIAGNOSIS — Z89511 Acquired absence of right leg below knee: Secondary | ICD-10-CM | POA: Diagnosis not present

## 2023-04-27 DIAGNOSIS — Z4781 Encounter for orthopedic aftercare following surgical amputation: Secondary | ICD-10-CM | POA: Diagnosis not present

## 2023-04-27 DIAGNOSIS — F319 Bipolar disorder, unspecified: Secondary | ICD-10-CM | POA: Diagnosis not present

## 2023-04-27 DIAGNOSIS — I70221 Atherosclerosis of native arteries of extremities with rest pain, right leg: Secondary | ICD-10-CM | POA: Diagnosis not present

## 2023-04-27 DIAGNOSIS — M47812 Spondylosis without myelopathy or radiculopathy, cervical region: Secondary | ICD-10-CM | POA: Diagnosis not present

## 2023-04-28 ENCOUNTER — Ambulatory Visit: Payer: Self-pay | Admitting: Family Medicine

## 2023-04-28 MED ORDER — CEPHALEXIN 500 MG PO CAPS: 500.0000 mg | ORAL_CAPSULE | Freq: Three times a day (TID) | ORAL | 0 refills | Status: DC

## 2023-04-28 NOTE — Telephone Encounter (Signed)
  Chief Complaint: reaction to antibiotic Symptoms: neck and hand itch and rash, nausea, dry heaving, sweating Frequency: constant once started antibiotic but has resolved now that she quit taking the abx Pertinent Negatives: Patient denies vomiting Disposition: [] ED /[] Urgent Care (no appt availability in office) / [] Appointment(In office/virtual)/ []  [facility] [facility]/ [] Home Care/ [] Refused Recommended Disposition /[] [facility]/ [x]  Follow-up with PCP Additional Notes: Patient stated she had reaction to antibiotic prescribed at her office visit on 12/12 that was prescribed for boils. Stated she had severe nausea and dry heaving, an itchy rash on her hands and neck, and profuse sweating. Patient states she stopped taking the antibiotics 3 days ago because of these symptoms and all symptoms have resolved. Patient is requesting another antibiotic or medication be called in, states "I need something else because I can't take what they gave me". Informed patient I will pass a high priority message to the physician and they will determine the course of action.    Copied from CRM [id number]. Topic: Clinical - Red Word Triage >> Apr 28, 2023 10:56 AM Isabell A wrote: Red Word that prompted transfer to Nurse Triage: Allergic Reaction to antibiotic Reason for Disposition  [1] Caller has NON-URGENT medicine question about med that PCP prescribed AND [2] triager unable to answer question  Answer Assessment - Initial Assessment Questions 1. NAME of MEDICINE: "What medicine(s) are you calling about?"     Doxycycline - recently prescribed - had a reaction to it 2. QUESTION: "What is your question?" (e.g., double dose of medicine, side effect)     Patient states she started the antibiotic that was prescribed to her in the office for her boils and had a bad reaction. Stated she had itchy hands and itchy neck both accompanied by a rash. Stated she had nausea and dry heaving. Also stated it caused  her to sweat profusely. 3. PRESCRIBER: "Who prescribed the medicine?" Reason: if prescribed by specialist, call should be referred to that group.     PCP - prescribed for boils, patient states. 4. SYMPTOMS: "Do you have any symptoms?" If Yes, ask: "What symptoms are you having?"  "How bad are the symptoms (e.g., mild, moderate, severe)     Rash and itchiness on hands and neck, nausea and dryl heaving, sweating  Protocols used: Medication Question Call-A-AH

## 2023-04-28 NOTE — Telephone Encounter (Signed)
Please let patient know that I will send in a different antibiotic to her local pharmacy.

## 2023-04-28 NOTE — Addendum Note (Signed)
Addended by: Kerby Nora E on: 04/28/2023 03:35 PM   Modules accepted: Orders

## 2023-04-28 NOTE — Telephone Encounter (Signed)
Ms. Weatherley notified by telephone that Dr. Ermalene Searing sent in a new antibiotic cephalexin to her pharmacy.

## 2023-04-28 NOTE — Addendum Note (Signed)
Addended by: Damita Lack on: 04/28/2023 03:46 PM   Modules accepted: Orders

## 2023-04-29 DIAGNOSIS — L02215 Cutaneous abscess of perineum: Secondary | ICD-10-CM | POA: Insufficient documentation

## 2023-04-29 NOTE — Assessment & Plan Note (Signed)
Acute, continue treating with warm compresses.  No clear indication for incision and drainage given lesion currently draining. 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 twice daily x 10 days. If lesion not improving significantly patient will return in the next 48 to 72 hours for possible incision and drainage.  Return and ER precautions provided.

## 2023-05-02 DIAGNOSIS — E538 Deficiency of other specified B group vitamins: Secondary | ICD-10-CM | POA: Diagnosis not present

## 2023-05-02 DIAGNOSIS — Z4781 Encounter for orthopedic aftercare following surgical amputation: Secondary | ICD-10-CM | POA: Diagnosis not present

## 2023-05-02 DIAGNOSIS — F319 Bipolar disorder, unspecified: Secondary | ICD-10-CM | POA: Diagnosis not present

## 2023-05-02 DIAGNOSIS — I70221 Atherosclerosis of native arteries of extremities with rest pain, right leg: Secondary | ICD-10-CM | POA: Diagnosis not present

## 2023-05-02 DIAGNOSIS — M47812 Spondylosis without myelopathy or radiculopathy, cervical region: Secondary | ICD-10-CM | POA: Diagnosis not present

## 2023-05-02 DIAGNOSIS — D649 Anemia, unspecified: Secondary | ICD-10-CM | POA: Diagnosis not present

## 2023-05-02 DIAGNOSIS — I10 Essential (primary) hypertension: Secondary | ICD-10-CM | POA: Diagnosis not present

## 2023-05-02 DIAGNOSIS — M4312 Spondylolisthesis, cervical region: Secondary | ICD-10-CM | POA: Diagnosis not present

## 2023-05-02 DIAGNOSIS — Z89511 Acquired absence of right leg below knee: Secondary | ICD-10-CM | POA: Diagnosis not present

## 2023-05-06 DIAGNOSIS — I70221 Atherosclerosis of native arteries of extremities with rest pain, right leg: Secondary | ICD-10-CM | POA: Diagnosis not present

## 2023-05-06 DIAGNOSIS — Z4781 Encounter for orthopedic aftercare following surgical amputation: Secondary | ICD-10-CM | POA: Diagnosis not present

## 2023-05-06 DIAGNOSIS — M47812 Spondylosis without myelopathy or radiculopathy, cervical region: Secondary | ICD-10-CM | POA: Diagnosis not present

## 2023-05-06 DIAGNOSIS — F319 Bipolar disorder, unspecified: Secondary | ICD-10-CM | POA: Diagnosis not present

## 2023-05-06 DIAGNOSIS — D649 Anemia, unspecified: Secondary | ICD-10-CM | POA: Diagnosis not present

## 2023-05-06 DIAGNOSIS — Z89511 Acquired absence of right leg below knee: Secondary | ICD-10-CM | POA: Diagnosis not present

## 2023-05-06 DIAGNOSIS — M4312 Spondylolisthesis, cervical region: Secondary | ICD-10-CM | POA: Diagnosis not present

## 2023-05-06 DIAGNOSIS — E538 Deficiency of other specified B group vitamins: Secondary | ICD-10-CM | POA: Diagnosis not present

## 2023-05-06 DIAGNOSIS — I10 Essential (primary) hypertension: Secondary | ICD-10-CM | POA: Diagnosis not present

## 2023-05-08 DIAGNOSIS — E538 Deficiency of other specified B group vitamins: Secondary | ICD-10-CM | POA: Diagnosis not present

## 2023-05-08 DIAGNOSIS — Z89511 Acquired absence of right leg below knee: Secondary | ICD-10-CM | POA: Diagnosis not present

## 2023-05-08 DIAGNOSIS — D649 Anemia, unspecified: Secondary | ICD-10-CM | POA: Diagnosis not present

## 2023-05-08 DIAGNOSIS — M4312 Spondylolisthesis, cervical region: Secondary | ICD-10-CM | POA: Diagnosis not present

## 2023-05-08 DIAGNOSIS — I10 Essential (primary) hypertension: Secondary | ICD-10-CM | POA: Diagnosis not present

## 2023-05-08 DIAGNOSIS — I70221 Atherosclerosis of native arteries of extremities with rest pain, right leg: Secondary | ICD-10-CM | POA: Diagnosis not present

## 2023-05-08 DIAGNOSIS — F319 Bipolar disorder, unspecified: Secondary | ICD-10-CM | POA: Diagnosis not present

## 2023-05-08 DIAGNOSIS — M47812 Spondylosis without myelopathy or radiculopathy, cervical region: Secondary | ICD-10-CM | POA: Diagnosis not present

## 2023-05-08 DIAGNOSIS — Z4781 Encounter for orthopedic aftercare following surgical amputation: Secondary | ICD-10-CM | POA: Diagnosis not present

## 2023-05-09 ENCOUNTER — Encounter: Payer: Self-pay | Admitting: Family Medicine

## 2023-05-09 ENCOUNTER — Ambulatory Visit (INDEPENDENT_AMBULATORY_CARE_PROVIDER_SITE_OTHER): Payer: Medicare HMO | Admitting: Family Medicine

## 2023-05-09 VITALS — BP 146/66 | HR 57 | Temp 98.8°F

## 2023-05-09 DIAGNOSIS — L0292 Furuncle, unspecified: Secondary | ICD-10-CM

## 2023-05-09 MED ORDER — DOXYCYCLINE HYCLATE 100 MG PO TABS: 100.0000 mg | ORAL_TABLET | Freq: Two times a day (BID) | ORAL | 0 refills | Status: DC

## 2023-05-09 NOTE — Progress Notes (Signed)
 Patient ID: Sydney Evans, female    DOB: 03-03-1951, 73 y.o.   MRN: [id number]  This visit was conducted in person.  BP (!) 146/66 (BP Location: Left Arm, Patient Position: Sitting, Cuff Size: Normal)   Pulse (!) 57   Temp 98.8 F (37.1 C) (Temporal)   SpO2 96%    CC:  Chief Complaint  Patient presents with   Perineal Abscess    Subjective:   HPI: Sydney Evans is a 73 y.o. female presenting on 05/09/2023 for Perineal Abscess   Perineal abscess:  Last seen on 12/12/024..  As lesion was draining, she was treated with oral antibiotics and warm compresses.  Complete doxycycline  dose without side effects or missed doses.    She feels like there was minimal improvement in discharge, but reports there is no pain at this time and it area has decreased in size dramatically.  She has expressed a lot of yellow discharge.   Not doing warm compresses... is sitting in warm water  instead   No fever.  No N/V.   Relevant past medical, surgical, family and social history reviewed and updated as indicated. Interim medical history since our last visit reviewed. Allergies and medications reviewed and updated. Outpatient Medications Prior to Visit  Medication Sig Dispense Refill   acetaminophen  (TYLENOL ) 500 MG tablet Take 500 mg by mouth every 6 (six) hours as needed.     albuterol  (VENTOLIN  HFA) 108 (90 Base) MCG/ACT inhaler Inhale 2 puffs into the lungs every 4 (four) hours as needed for shortness of breath. 1 each 0   amLODipine  (NORVASC ) 5 MG tablet Take 1 tablet (5 mg total) by mouth daily. 90 tablet 3   atorvastatin  (LIPITOR) 40 MG tablet Take 0.5 tablets (20 mg total) by mouth daily. 90 tablet 3   carboxymethylcellulose (LUBRICANT EYE DROPS) 0.5 % SOLN Place 2 drops into both eyes daily as needed (Dry eye). Systane 30 mL 3   citalopram  (CELEXA ) 40 MG tablet Take 1 tablet (40 mg total) by mouth daily. 90 tablet 1   diphenoxylate -atropine  (LOMOTIL ) 2.5-0.025 MG tablet Take 1-2 tablets  by mouth 4 (four) times daily as needed for diarrhea or loose stools (max 8 tabs per day). 30 tablet 0   fluticasone  (FLONASE ) 50 MCG/ACT nasal spray Place 1 spray into both nostrils daily as needed for allergies or rhinitis. 16 g 5   gabapentin  (NEURONTIN ) 300 MG capsule Take two capsules TID and 2  capsule QHS 240 capsule 3   ibuprofen  (ADVIL ) 800 MG tablet Take 800 mg by mouth every 8 (eight) hours as needed.     lamoTRIgine  (LAMICTAL ) 200 MG tablet Take 1 tablet (200 mg total) by mouth daily. 90 tablet 0   latanoprost  (XALATAN ) 0.005 % ophthalmic solution Place 1 drop into both eyes at bedtime. 2.5 mL 5   methocarbamol  (ROBAXIN ) 500 MG tablet Take 1 tablet (500 mg total) by mouth 2 (two) times daily. 40 tablet 3   pantoprazole  (PROTONIX ) 40 MG tablet Take 1 tablet (40 mg total) by mouth 2 (two) times daily. 180 tablet 1   propranolol  (INDERAL ) 20 MG tablet Take 1 tablet (20 mg total) by mouth daily as needed. 30 tablet 0   valACYclovir  (VALTREX ) 500 MG tablet Take 1 tablet (500 mg total) by mouth daily. 90 tablet 0   cephALEXin  (KEFLEX ) 500 MG capsule Take 1 capsule (500 mg total) by mouth 3 (three) times daily. 21 capsule 0   No facility-administered medications prior to visit.  Per HPI unless specifically indicated in ROS section below Review of Systems  Constitutional:  Negative for fatigue and fever.  HENT:  Negative for congestion.   Eyes:  Negative for pain.  Respiratory:  Negative for cough and shortness of breath.   Cardiovascular:  Negative for chest pain, palpitations and leg swelling.  Gastrointestinal:  Negative for abdominal pain.  Genitourinary:  Negative for dysuria and vaginal bleeding.  Musculoskeletal:  Negative for back pain.  Neurological:  Negative for syncope, light-headedness and headaches.  Psychiatric/Behavioral:  Negative for dysphoric mood.    Objective:  BP (!) 146/66 (BP Location: Left Arm, Patient Position: Sitting, Cuff Size: Normal)   Pulse (!) 57    Temp 98.8 F (37.1 C) (Temporal)   SpO2 96%   Wt Readings from Last 3 Encounters:  03/14/23 167 lb (75.8 kg)  01/31/23 167 lb 5.3 oz (75.9 kg)  12/24/22 161 lb (73 kg)      Physical Exam Constitutional:      General: She is not in acute distress.    Appearance: Normal appearance. She is well-developed. She is not ill-appearing or toxic-appearing.  HENT:     Head: Normocephalic.     Right Ear: Hearing, tympanic membrane, ear canal and external ear normal. Tympanic membrane is not erythematous, retracted or bulging.     Left Ear: Hearing, tympanic membrane, ear canal and external ear normal. Tympanic membrane is not erythematous, retracted or bulging.     Nose: No mucosal edema or rhinorrhea.     Right Sinus: No maxillary sinus tenderness or frontal sinus tenderness.     Left Sinus: No maxillary sinus tenderness or frontal sinus tenderness.     Mouth/Throat:     Pharynx: Uvula midline.  Eyes:     General: Lids are normal. Lids are everted, no foreign bodies appreciated.     Conjunctiva/sclera: Conjunctivae normal.     Pupils: Pupils are equal, round, and reactive to light.  Neck:     Thyroid : No thyroid  mass or thyromegaly.     Vascular: No carotid bruit.     Trachea: Trachea normal.  Cardiovascular:     Rate and Rhythm: Normal rate and regular rhythm.     Pulses: Normal pulses.     Heart sounds: Normal heart sounds, S1 normal and S2 normal. No murmur heard.    No friction rub. No gallop.  Pulmonary:     Effort: Pulmonary effort is normal. No tachypnea or respiratory distress.     Breath sounds: Normal breath sounds. No decreased breath sounds, wheezing, rhonchi or rales.  Abdominal:     General: Bowel sounds are normal.     Palpations: Abdomen is soft.     Tenderness: There is no abdominal tenderness.  Musculoskeletal:     Cervical back: Normal range of motion and neck supple.  Skin:    General: Skin is warm and dry.     Findings: No rash.     Comments: Multiple cystic  scarring changes in bilateral groin,  on mons pubis,and on perineum  no current erythema or fluctuance Area where perineal boil was previously has now resolved  Neurological:     Mental Status: She is alert.  Psychiatric:        Mood and Affect: Mood is not anxious or depressed.        Speech: Speech normal.        Behavior: Behavior normal. Behavior is cooperative.        Thought Content: Thought content normal.  Judgment: Judgment normal.       Results for orders placed or performed in visit on 03/26/23  Lipid panel   Collection Time: 03/26/23 11:42 AM  Result Value Ref Range   Cholesterol 179 0 - 200 mg/dL   Triglycerides 06.9 0.0 - 149.0 mg/dL   HDL 49.79 >60.99 mg/dL   VLDL 81.3 0.0 - 59.9 mg/dL   LDL Cholesterol 889 (H) 0 - 99 mg/dL   Total CHOL/HDL Ratio 4    NonHDL 128.50   Hemoglobin A1c   Collection Time: 03/26/23 11:42 AM  Result Value Ref Range   Hgb A1c MFr Bld 5.6 4.6 - 6.5 %  Basic metabolic panel   Collection Time: 03/26/23 11:42 AM  Result Value Ref Range   Sodium 139 135 - 145 mEq/L   Potassium 4.7 3.5 - 5.1 mEq/L   Chloride 107 96 - 112 mEq/L   CO2 28 19 - 32 mEq/L   Glucose, Bld 98 70 - 99 mg/dL   BUN 10 6 - 23 mg/dL   Creatinine, Ser 9.17 0.40 - 1.20 mg/dL   GFR 27.57 >39.99 mL/min   Calcium  8.9 8.4 - 10.5 mg/dL  CBC with Differential   Collection Time: 03/26/23 11:42 AM  Result Value Ref Range   WBC 5.7 4.0 - 10.5 K/uL   RBC 4.37 3.87 - 5.11 Mil/uL   Hemoglobin 13.0 12.0 - 15.0 g/dL   HCT 59.9 63.9 - 53.9 %   MCV 91.6 78.0 - 100.0 fl   MCHC 32.5 30.0 - 36.0 g/dL   RDW 84.6 88.4 - 84.4 %   Platelets 316.0 150.0 - 400.0 K/uL   Neutrophils Relative % 62.6 43.0 - 77.0 %   Lymphocytes Relative 27.3 12.0 - 46.0 %   Monocytes Relative 5.1 3.0 - 12.0 %   Eosinophils Relative 3.9 0.0 - 5.0 %   Basophils Relative 1.1 0.0 - 3.0 %   Neutro Abs 3.6 1.4 - 7.7 K/uL   Lymphs Abs 1.6 0.7 - 4.0 K/uL   Monocytes Absolute 0.3 0.1 - 1.0 K/uL    Eosinophils Absolute 0.2 0.0 - 0.7 K/uL   Basophils Absolute 0.1 0.0 - 0.1 K/uL    Assessment and Plan  Recurrent boils Assessment & Plan: Acute flare of chronic issue, now resolved status post antibiotics and drainage.  Patient has significant changes suggesting likely her adenitis suppurativa causing recurrent groin infections. Will repeat doxycycline  course as there is a small erythematous nodule on mons pubis and to entirely eradicate infection below.  We discussed continuing to use antibacterial soap and to keep the areas clean and dry.   Other orders -     Doxycycline  Hyclate; Take 1 tablet (100 mg total) by mouth 2 (two) times daily.  Dispense: 20 tablet; Refill: 0    No follow-ups on file.   Greig Ring, MD

## 2023-05-09 NOTE — Assessment & Plan Note (Signed)
 Acute flare of chronic issue, now resolved status post antibiotics and drainage.  Patient has significant changes suggesting likely her adenitis suppurativa causing recurrent groin infections. Will repeat doxycycline  course as there is a small erythematous nodule on mons pubis and to entirely eradicate infection below.  We discussed continuing to use antibacterial soap and to keep the areas clean and dry.

## 2023-05-12 DIAGNOSIS — Z89511 Acquired absence of right leg below knee: Secondary | ICD-10-CM | POA: Diagnosis not present

## 2023-05-12 DIAGNOSIS — I10 Essential (primary) hypertension: Secondary | ICD-10-CM | POA: Diagnosis not present

## 2023-05-12 DIAGNOSIS — Z4781 Encounter for orthopedic aftercare following surgical amputation: Secondary | ICD-10-CM | POA: Diagnosis not present

## 2023-05-12 DIAGNOSIS — D649 Anemia, unspecified: Secondary | ICD-10-CM | POA: Diagnosis not present

## 2023-05-12 DIAGNOSIS — M4312 Spondylolisthesis, cervical region: Secondary | ICD-10-CM | POA: Diagnosis not present

## 2023-05-12 DIAGNOSIS — F319 Bipolar disorder, unspecified: Secondary | ICD-10-CM | POA: Diagnosis not present

## 2023-05-12 DIAGNOSIS — M47812 Spondylosis without myelopathy or radiculopathy, cervical region: Secondary | ICD-10-CM | POA: Diagnosis not present

## 2023-05-12 DIAGNOSIS — E538 Deficiency of other specified B group vitamins: Secondary | ICD-10-CM | POA: Diagnosis not present

## 2023-05-12 DIAGNOSIS — I70221 Atherosclerosis of native arteries of extremities with rest pain, right leg: Secondary | ICD-10-CM | POA: Diagnosis not present

## 2023-05-13 DIAGNOSIS — D649 Anemia, unspecified: Secondary | ICD-10-CM | POA: Diagnosis not present

## 2023-05-13 DIAGNOSIS — I70221 Atherosclerosis of native arteries of extremities with rest pain, right leg: Secondary | ICD-10-CM | POA: Diagnosis not present

## 2023-05-13 DIAGNOSIS — M4312 Spondylolisthesis, cervical region: Secondary | ICD-10-CM | POA: Diagnosis not present

## 2023-05-13 DIAGNOSIS — I10 Essential (primary) hypertension: Secondary | ICD-10-CM | POA: Diagnosis not present

## 2023-05-13 DIAGNOSIS — F319 Bipolar disorder, unspecified: Secondary | ICD-10-CM | POA: Diagnosis not present

## 2023-05-13 DIAGNOSIS — Z89511 Acquired absence of right leg below knee: Secondary | ICD-10-CM | POA: Diagnosis not present

## 2023-05-13 DIAGNOSIS — E538 Deficiency of other specified B group vitamins: Secondary | ICD-10-CM | POA: Diagnosis not present

## 2023-05-13 DIAGNOSIS — Z4781 Encounter for orthopedic aftercare following surgical amputation: Secondary | ICD-10-CM | POA: Diagnosis not present

## 2023-05-13 DIAGNOSIS — M47812 Spondylosis without myelopathy or radiculopathy, cervical region: Secondary | ICD-10-CM | POA: Diagnosis not present

## 2023-05-14 ENCOUNTER — Other Ambulatory Visit: Payer: Self-pay | Admitting: Family

## 2023-05-14 DIAGNOSIS — M4312 Spondylolisthesis, cervical region: Secondary | ICD-10-CM | POA: Diagnosis not present

## 2023-05-14 DIAGNOSIS — I70221 Atherosclerosis of native arteries of extremities with rest pain, right leg: Secondary | ICD-10-CM | POA: Diagnosis not present

## 2023-05-14 DIAGNOSIS — D649 Anemia, unspecified: Secondary | ICD-10-CM | POA: Diagnosis not present

## 2023-05-14 DIAGNOSIS — I10 Essential (primary) hypertension: Secondary | ICD-10-CM | POA: Diagnosis not present

## 2023-05-14 DIAGNOSIS — E538 Deficiency of other specified B group vitamins: Secondary | ICD-10-CM | POA: Diagnosis not present

## 2023-05-14 DIAGNOSIS — F319 Bipolar disorder, unspecified: Secondary | ICD-10-CM | POA: Diagnosis not present

## 2023-05-14 DIAGNOSIS — Z4781 Encounter for orthopedic aftercare following surgical amputation: Secondary | ICD-10-CM | POA: Diagnosis not present

## 2023-05-14 DIAGNOSIS — K219 Gastro-esophageal reflux disease without esophagitis: Secondary | ICD-10-CM

## 2023-05-14 DIAGNOSIS — M47812 Spondylosis without myelopathy or radiculopathy, cervical region: Secondary | ICD-10-CM | POA: Diagnosis not present

## 2023-05-14 DIAGNOSIS — Z89511 Acquired absence of right leg below knee: Secondary | ICD-10-CM | POA: Diagnosis not present

## 2023-05-15 DIAGNOSIS — Z89511 Acquired absence of right leg below knee: Secondary | ICD-10-CM | POA: Diagnosis not present

## 2023-05-15 DIAGNOSIS — E538 Deficiency of other specified B group vitamins: Secondary | ICD-10-CM | POA: Diagnosis not present

## 2023-05-15 DIAGNOSIS — M47812 Spondylosis without myelopathy or radiculopathy, cervical region: Secondary | ICD-10-CM | POA: Diagnosis not present

## 2023-05-15 DIAGNOSIS — I10 Essential (primary) hypertension: Secondary | ICD-10-CM | POA: Diagnosis not present

## 2023-05-15 DIAGNOSIS — D649 Anemia, unspecified: Secondary | ICD-10-CM | POA: Diagnosis not present

## 2023-05-15 DIAGNOSIS — F319 Bipolar disorder, unspecified: Secondary | ICD-10-CM | POA: Diagnosis not present

## 2023-05-15 DIAGNOSIS — M4312 Spondylolisthesis, cervical region: Secondary | ICD-10-CM | POA: Diagnosis not present

## 2023-05-15 DIAGNOSIS — Z4781 Encounter for orthopedic aftercare following surgical amputation: Secondary | ICD-10-CM | POA: Diagnosis not present

## 2023-05-15 DIAGNOSIS — I70221 Atherosclerosis of native arteries of extremities with rest pain, right leg: Secondary | ICD-10-CM | POA: Diagnosis not present

## 2023-05-18 DIAGNOSIS — Z89511 Acquired absence of right leg below knee: Secondary | ICD-10-CM | POA: Diagnosis not present

## 2023-05-18 DIAGNOSIS — F319 Bipolar disorder, unspecified: Secondary | ICD-10-CM | POA: Diagnosis not present

## 2023-05-18 DIAGNOSIS — D649 Anemia, unspecified: Secondary | ICD-10-CM | POA: Diagnosis not present

## 2023-05-18 DIAGNOSIS — Z4781 Encounter for orthopedic aftercare following surgical amputation: Secondary | ICD-10-CM | POA: Diagnosis not present

## 2023-05-18 DIAGNOSIS — E538 Deficiency of other specified B group vitamins: Secondary | ICD-10-CM | POA: Diagnosis not present

## 2023-05-18 DIAGNOSIS — M4312 Spondylolisthesis, cervical region: Secondary | ICD-10-CM | POA: Diagnosis not present

## 2023-05-18 DIAGNOSIS — I70221 Atherosclerosis of native arteries of extremities with rest pain, right leg: Secondary | ICD-10-CM | POA: Diagnosis not present

## 2023-05-18 DIAGNOSIS — M47812 Spondylosis without myelopathy or radiculopathy, cervical region: Secondary | ICD-10-CM | POA: Diagnosis not present

## 2023-05-18 DIAGNOSIS — I10 Essential (primary) hypertension: Secondary | ICD-10-CM | POA: Diagnosis not present

## 2023-05-19 DIAGNOSIS — Z4781 Encounter for orthopedic aftercare following surgical amputation: Secondary | ICD-10-CM | POA: Diagnosis not present

## 2023-05-19 DIAGNOSIS — M47812 Spondylosis without myelopathy or radiculopathy, cervical region: Secondary | ICD-10-CM | POA: Diagnosis not present

## 2023-05-19 DIAGNOSIS — D649 Anemia, unspecified: Secondary | ICD-10-CM | POA: Diagnosis not present

## 2023-05-19 DIAGNOSIS — F319 Bipolar disorder, unspecified: Secondary | ICD-10-CM | POA: Diagnosis not present

## 2023-05-19 DIAGNOSIS — Z89511 Acquired absence of right leg below knee: Secondary | ICD-10-CM | POA: Diagnosis not present

## 2023-05-19 DIAGNOSIS — E538 Deficiency of other specified B group vitamins: Secondary | ICD-10-CM | POA: Diagnosis not present

## 2023-05-19 DIAGNOSIS — I10 Essential (primary) hypertension: Secondary | ICD-10-CM | POA: Diagnosis not present

## 2023-05-19 DIAGNOSIS — I70221 Atherosclerosis of native arteries of extremities with rest pain, right leg: Secondary | ICD-10-CM | POA: Diagnosis not present

## 2023-05-19 DIAGNOSIS — M4312 Spondylolisthesis, cervical region: Secondary | ICD-10-CM | POA: Diagnosis not present

## 2023-05-22 DIAGNOSIS — E538 Deficiency of other specified B group vitamins: Secondary | ICD-10-CM | POA: Diagnosis not present

## 2023-05-22 DIAGNOSIS — F319 Bipolar disorder, unspecified: Secondary | ICD-10-CM | POA: Diagnosis not present

## 2023-05-22 DIAGNOSIS — M47812 Spondylosis without myelopathy or radiculopathy, cervical region: Secondary | ICD-10-CM | POA: Diagnosis not present

## 2023-05-22 DIAGNOSIS — I10 Essential (primary) hypertension: Secondary | ICD-10-CM | POA: Diagnosis not present

## 2023-05-22 DIAGNOSIS — Z89511 Acquired absence of right leg below knee: Secondary | ICD-10-CM | POA: Diagnosis not present

## 2023-05-22 DIAGNOSIS — Z4781 Encounter for orthopedic aftercare following surgical amputation: Secondary | ICD-10-CM | POA: Diagnosis not present

## 2023-05-22 DIAGNOSIS — M4312 Spondylolisthesis, cervical region: Secondary | ICD-10-CM | POA: Diagnosis not present

## 2023-05-22 DIAGNOSIS — I70221 Atherosclerosis of native arteries of extremities with rest pain, right leg: Secondary | ICD-10-CM | POA: Diagnosis not present

## 2023-05-22 DIAGNOSIS — D649 Anemia, unspecified: Secondary | ICD-10-CM | POA: Diagnosis not present

## 2023-05-26 ENCOUNTER — Encounter: Payer: Self-pay | Admitting: Internal Medicine

## 2023-05-27 DIAGNOSIS — Z89511 Acquired absence of right leg below knee: Secondary | ICD-10-CM | POA: Diagnosis not present

## 2023-05-27 DIAGNOSIS — E538 Deficiency of other specified B group vitamins: Secondary | ICD-10-CM | POA: Diagnosis not present

## 2023-05-27 DIAGNOSIS — M4312 Spondylolisthesis, cervical region: Secondary | ICD-10-CM | POA: Diagnosis not present

## 2023-05-27 DIAGNOSIS — D649 Anemia, unspecified: Secondary | ICD-10-CM | POA: Diagnosis not present

## 2023-05-27 DIAGNOSIS — I70221 Atherosclerosis of native arteries of extremities with rest pain, right leg: Secondary | ICD-10-CM | POA: Diagnosis not present

## 2023-05-27 DIAGNOSIS — F319 Bipolar disorder, unspecified: Secondary | ICD-10-CM | POA: Diagnosis not present

## 2023-05-27 DIAGNOSIS — I10 Essential (primary) hypertension: Secondary | ICD-10-CM | POA: Diagnosis not present

## 2023-05-27 DIAGNOSIS — Z4781 Encounter for orthopedic aftercare following surgical amputation: Secondary | ICD-10-CM | POA: Diagnosis not present

## 2023-05-27 DIAGNOSIS — M47812 Spondylosis without myelopathy or radiculopathy, cervical region: Secondary | ICD-10-CM | POA: Diagnosis not present

## 2023-05-29 DIAGNOSIS — Z4781 Encounter for orthopedic aftercare following surgical amputation: Secondary | ICD-10-CM | POA: Diagnosis not present

## 2023-05-29 DIAGNOSIS — Z89511 Acquired absence of right leg below knee: Secondary | ICD-10-CM | POA: Diagnosis not present

## 2023-05-29 DIAGNOSIS — I70221 Atherosclerosis of native arteries of extremities with rest pain, right leg: Secondary | ICD-10-CM | POA: Diagnosis not present

## 2023-05-29 DIAGNOSIS — F319 Bipolar disorder, unspecified: Secondary | ICD-10-CM | POA: Diagnosis not present

## 2023-05-29 DIAGNOSIS — D649 Anemia, unspecified: Secondary | ICD-10-CM | POA: Diagnosis not present

## 2023-05-29 DIAGNOSIS — E538 Deficiency of other specified B group vitamins: Secondary | ICD-10-CM | POA: Diagnosis not present

## 2023-05-29 DIAGNOSIS — M4312 Spondylolisthesis, cervical region: Secondary | ICD-10-CM | POA: Diagnosis not present

## 2023-05-29 DIAGNOSIS — I10 Essential (primary) hypertension: Secondary | ICD-10-CM | POA: Diagnosis not present

## 2023-05-29 DIAGNOSIS — M47812 Spondylosis without myelopathy or radiculopathy, cervical region: Secondary | ICD-10-CM | POA: Diagnosis not present

## 2023-06-02 DIAGNOSIS — M4312 Spondylolisthesis, cervical region: Secondary | ICD-10-CM | POA: Diagnosis not present

## 2023-06-02 DIAGNOSIS — D649 Anemia, unspecified: Secondary | ICD-10-CM | POA: Diagnosis not present

## 2023-06-02 DIAGNOSIS — I10 Essential (primary) hypertension: Secondary | ICD-10-CM | POA: Diagnosis not present

## 2023-06-02 DIAGNOSIS — I70221 Atherosclerosis of native arteries of extremities with rest pain, right leg: Secondary | ICD-10-CM | POA: Diagnosis not present

## 2023-06-02 DIAGNOSIS — E538 Deficiency of other specified B group vitamins: Secondary | ICD-10-CM | POA: Diagnosis not present

## 2023-06-02 DIAGNOSIS — F319 Bipolar disorder, unspecified: Secondary | ICD-10-CM | POA: Diagnosis not present

## 2023-06-02 DIAGNOSIS — M47812 Spondylosis without myelopathy or radiculopathy, cervical region: Secondary | ICD-10-CM | POA: Diagnosis not present

## 2023-06-02 DIAGNOSIS — Z89511 Acquired absence of right leg below knee: Secondary | ICD-10-CM | POA: Diagnosis not present

## 2023-06-02 DIAGNOSIS — Z4781 Encounter for orthopedic aftercare following surgical amputation: Secondary | ICD-10-CM | POA: Diagnosis not present

## 2023-06-05 DIAGNOSIS — Z89511 Acquired absence of right leg below knee: Secondary | ICD-10-CM | POA: Diagnosis not present

## 2023-06-05 DIAGNOSIS — I10 Essential (primary) hypertension: Secondary | ICD-10-CM | POA: Diagnosis not present

## 2023-06-05 DIAGNOSIS — F319 Bipolar disorder, unspecified: Secondary | ICD-10-CM | POA: Diagnosis not present

## 2023-06-05 DIAGNOSIS — D649 Anemia, unspecified: Secondary | ICD-10-CM | POA: Diagnosis not present

## 2023-06-05 DIAGNOSIS — Z4781 Encounter for orthopedic aftercare following surgical amputation: Secondary | ICD-10-CM | POA: Diagnosis not present

## 2023-06-05 DIAGNOSIS — I70221 Atherosclerosis of native arteries of extremities with rest pain, right leg: Secondary | ICD-10-CM | POA: Diagnosis not present

## 2023-06-05 DIAGNOSIS — M4312 Spondylolisthesis, cervical region: Secondary | ICD-10-CM | POA: Diagnosis not present

## 2023-06-05 DIAGNOSIS — M47812 Spondylosis without myelopathy or radiculopathy, cervical region: Secondary | ICD-10-CM | POA: Diagnosis not present

## 2023-06-05 DIAGNOSIS — E538 Deficiency of other specified B group vitamins: Secondary | ICD-10-CM | POA: Diagnosis not present

## 2023-06-06 DIAGNOSIS — D649 Anemia, unspecified: Secondary | ICD-10-CM | POA: Diagnosis not present

## 2023-06-06 DIAGNOSIS — I70221 Atherosclerosis of native arteries of extremities with rest pain, right leg: Secondary | ICD-10-CM | POA: Diagnosis not present

## 2023-06-06 DIAGNOSIS — E538 Deficiency of other specified B group vitamins: Secondary | ICD-10-CM | POA: Diagnosis not present

## 2023-06-06 DIAGNOSIS — Z4781 Encounter for orthopedic aftercare following surgical amputation: Secondary | ICD-10-CM | POA: Diagnosis not present

## 2023-06-06 DIAGNOSIS — Z89511 Acquired absence of right leg below knee: Secondary | ICD-10-CM | POA: Diagnosis not present

## 2023-06-06 DIAGNOSIS — I10 Essential (primary) hypertension: Secondary | ICD-10-CM | POA: Diagnosis not present

## 2023-06-06 DIAGNOSIS — M4312 Spondylolisthesis, cervical region: Secondary | ICD-10-CM | POA: Diagnosis not present

## 2023-06-06 DIAGNOSIS — F319 Bipolar disorder, unspecified: Secondary | ICD-10-CM | POA: Diagnosis not present

## 2023-06-06 DIAGNOSIS — M47812 Spondylosis without myelopathy or radiculopathy, cervical region: Secondary | ICD-10-CM | POA: Diagnosis not present

## 2023-06-09 ENCOUNTER — Encounter: Payer: Self-pay | Admitting: Internal Medicine

## 2023-06-09 DIAGNOSIS — Z89511 Acquired absence of right leg below knee: Secondary | ICD-10-CM | POA: Diagnosis not present

## 2023-06-09 DIAGNOSIS — M4312 Spondylolisthesis, cervical region: Secondary | ICD-10-CM | POA: Diagnosis not present

## 2023-06-09 DIAGNOSIS — F319 Bipolar disorder, unspecified: Secondary | ICD-10-CM | POA: Diagnosis not present

## 2023-06-09 DIAGNOSIS — Z4781 Encounter for orthopedic aftercare following surgical amputation: Secondary | ICD-10-CM | POA: Diagnosis not present

## 2023-06-09 DIAGNOSIS — M47812 Spondylosis without myelopathy or radiculopathy, cervical region: Secondary | ICD-10-CM | POA: Diagnosis not present

## 2023-06-09 DIAGNOSIS — I70221 Atherosclerosis of native arteries of extremities with rest pain, right leg: Secondary | ICD-10-CM | POA: Diagnosis not present

## 2023-06-09 DIAGNOSIS — E538 Deficiency of other specified B group vitamins: Secondary | ICD-10-CM | POA: Diagnosis not present

## 2023-06-09 DIAGNOSIS — D649 Anemia, unspecified: Secondary | ICD-10-CM | POA: Diagnosis not present

## 2023-06-09 DIAGNOSIS — I10 Essential (primary) hypertension: Secondary | ICD-10-CM | POA: Diagnosis not present

## 2023-06-10 ENCOUNTER — Telehealth: Payer: Self-pay

## 2023-06-10 ENCOUNTER — Ambulatory Visit: Payer: No Typology Code available for payment source | Admitting: Family Medicine

## 2023-06-10 NOTE — Telephone Encounter (Signed)
 Noted

## 2023-06-10 NOTE — Telephone Encounter (Signed)
 Copied from CRM [id number]. Topic: General - Other >> Jun 10, 2023  8:27 AM Benton O wrote: Reason for CRM: connie from [facility] calling to leave message for nurse i would like to report patient had a fall 2 days in a row Thursday and Saturday no major injuries accept that she complaining  about a bruise on her left hip and right stump.also we are discharging patient because she has changed insurance and do plan and accepting her back with her new insurance we would like to request for a new order of her ppop and [facility] . And if they could fax that order [phone] Call back number [phone] connie

## 2023-06-12 ENCOUNTER — Ambulatory Visit: Payer: No Typology Code available for payment source | Admitting: Family Medicine

## 2023-06-13 ENCOUNTER — Ambulatory Visit: Payer: No Typology Code available for payment source | Admitting: Family Medicine

## 2023-06-13 ENCOUNTER — Ambulatory Visit (INDEPENDENT_AMBULATORY_CARE_PROVIDER_SITE_OTHER)
Admission: RE | Admit: 2023-06-13 | Discharge: 2023-06-13 | Disposition: A | Discharge status: Home / Self Care | Payer: No Typology Code available for payment source | Source: Ambulatory Visit | Attending: Family Medicine | Admitting: Family Medicine

## 2023-06-13 ENCOUNTER — Encounter: Payer: Self-pay | Admitting: Family Medicine

## 2023-06-13 VITALS — BP 177/83 | HR 59 | Temp 98.2°F

## 2023-06-13 DIAGNOSIS — M79601 Pain in right arm: Secondary | ICD-10-CM | POA: Diagnosis not present

## 2023-06-13 DIAGNOSIS — S88111A Complete traumatic amputation at level between knee and ankle, right lower leg, initial encounter: Secondary | ICD-10-CM

## 2023-06-13 DIAGNOSIS — R948 Abnormal results of function studies of other organs and systems: Secondary | ICD-10-CM | POA: Diagnosis not present

## 2023-06-13 DIAGNOSIS — W19XXXA Unspecified fall, initial encounter: Secondary | ICD-10-CM

## 2023-06-13 DIAGNOSIS — Z89511 Acquired absence of right leg below knee: Secondary | ICD-10-CM | POA: Diagnosis not present

## 2023-06-13 DIAGNOSIS — M79604 Pain in right leg: Secondary | ICD-10-CM

## 2023-06-13 DIAGNOSIS — S88111S Complete traumatic amputation at level between knee and ankle, right lower leg, sequela: Secondary | ICD-10-CM

## 2023-06-13 DIAGNOSIS — Z96651 Presence of right artificial knee joint: Secondary | ICD-10-CM | POA: Diagnosis not present

## 2023-06-13 DIAGNOSIS — Z043 Encounter for examination and observation following other accident: Secondary | ICD-10-CM | POA: Diagnosis not present

## 2023-06-13 NOTE — Patient Instructions (Addendum)
 Hold ibuprofen .  Try a trial of meloxicam  15 mg daily for pain and inflammation.  Also apply OTC Voltaren  gel 4 times daily to hands and wrists.  We will call with X-ray results.  Work on low k[place], avoid processed foods.  If interested you, can return for thyroid  testing.

## 2023-06-13 NOTE — Progress Notes (Signed)
 Patient ID: Sydney Evans, female    DOB: 27-Apr-1951, 73 y.o.   MRN: [id number]  This visit was conducted in person.  BP (!) 177/83 (BP Location: Left Arm, Patient Position: Sitting, Cuff Size: Normal)   Pulse (!) 59   Temp 98.2 F (36.8 C) (Temporal)   SpO2 98%    CC:  Chief Complaint  Patient presents with   Fall   Arm Pain    Patient doesn't think the arm/hand pain is related to fall   Hand Pain         Subjective:   HPI: Sydney Evans is a 73 y.o. female presenting on 06/13/2023 for Fall, Arm Pain (Patient doesn't think the arm/hand pain is related to fall), and Hand Pain (/)    Recent fall on 2/4 lost balance, slipped forward out of chair.. she landed on right leg stump.   Has  mild swelling,  no bruising cannot get on prosthetic on given pain.   She also has noted a decrease in metabolism .SABRA Cannot loose weight in abdomen.     She has had several years has had pain in right bicep pain, pain with movement.  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   Last OV 1 year ago with ortho. Has tried PT.  Relevant past medical, surgical, family and social history reviewed and updated as indicated. Interim medical history since our last visit reviewed. Allergies and medications reviewed and updated. Outpatient Medications Prior to Visit  Medication Sig Dispense Refill   acetaminophen  (TYLENOL ) 500 MG tablet Take 500 mg by mouth every 6 (six) hours as needed.     albuterol  (VENTOLIN  HFA) 108 (90 Base) MCG/ACT inhaler Inhale 2 puffs into the lungs every 4 (four) hours as needed for shortness of breath. 1 each 0   amLODipine  (NORVASC ) 5 MG tablet Take 1 tablet (5 mg total) by mouth daily. 90 tablet 3   atorvastatin  (LIPITOR) 40 MG tablet Take 0.5 tablets (20 mg total) by mouth daily. 90 tablet 3   carboxymethylcellulose (LUBRICANT EYE DROPS) 0.5 % SOLN Place 2 drops into both eyes daily as needed (Dry eye). Systane 30 mL 3   citalopram  (CELEXA ) 40 MG tablet Take 1 tablet (40 mg total) by  mouth daily. 90 tablet 1   diphenoxylate -atropine  (LOMOTIL ) 2.5-0.025 MG tablet Take 1-2 tablets by mouth 4 (four) times daily as needed for diarrhea or loose stools (max 8 tabs per day). 30 tablet 0   doxycycline  (VIBRA -TABS) 100 MG tablet Take 1 tablet (100 mg total) by mouth 2 (two) times daily. 20 tablet 0   fluticasone  (FLONASE ) 50 MCG/ACT nasal spray Place 1 spray into both nostrils daily as needed for allergies or rhinitis. 16 g 5   gabapentin  (NEURONTIN ) 300 MG capsule Take two capsules TID and 2  capsule QHS 240 capsule 3   lamoTRIgine  (LAMICTAL ) 200 MG tablet Take 1 tablet (200 mg total) by mouth daily. 90 tablet 0   latanoprost  (XALATAN ) 0.005 % ophthalmic solution Place 1 drop into both eyes at bedtime. 2.5 mL 5   methocarbamol  (ROBAXIN ) 500 MG tablet Take 1 tablet (500 mg total) by mouth 2 (two) times daily. 40 tablet 3   pantoprazole  (PROTONIX ) 40 MG tablet Take 1 tablet by mouth twice daily 180 tablet 0   propranolol  (INDERAL ) 20 MG tablet Take 1 tablet (20 mg total) by mouth daily as needed. 30 tablet 0   valACYclovir  (VALTREX ) 500 MG tablet Take 1 tablet (500 mg total) by mouth daily.  90 tablet 0   ibuprofen  (ADVIL ) 800 MG tablet Take 800 mg by mouth every 8 (eight) hours as needed.     No facility-administered medications prior to visit.     Per HPI unless specifically indicated in ROS section below Review of Systems  Constitutional:  Negative for fatigue and fever.  HENT:  Negative for congestion.   Eyes:  Negative for pain.  Respiratory:  Negative for cough and shortness of breath.   Cardiovascular:  Negative for chest pain, palpitations and leg swelling.  Gastrointestinal:  Negative for abdominal pain.  Genitourinary:  Negative for dysuria and vaginal bleeding.  Musculoskeletal:  Negative for back pain.  Neurological:  Negative for syncope, light-headedness and headaches.  Psychiatric/Behavioral:  Negative for dysphoric mood.    Objective:  BP (!) 177/83 (BP  Location: Left Arm, Patient Position: Sitting, Cuff Size: Normal)   Pulse (!) 59   Temp 98.2 F (36.8 C) (Temporal)   SpO2 98%   Wt Readings from Last 3 Encounters:  03/14/23 167 lb (75.8 kg)  01/31/23 167 lb 5.3 oz (75.9 kg)  12/24/22 161 lb (73 kg)      Physical Exam Constitutional:      General: She is not in acute distress.    Appearance: Normal appearance. She is well-developed. She is not ill-appearing or toxic-appearing.  HENT:     Head: Normocephalic.     Right Ear: Hearing, tympanic membrane, ear canal and external ear normal. Tympanic membrane is not erythematous, retracted or bulging.     Left Ear: Hearing, tympanic membrane, ear canal and external ear normal. Tympanic membrane is not erythematous, retracted or bulging.     Nose: No mucosal edema or rhinorrhea.     Right Sinus: No maxillary sinus tenderness or frontal sinus tenderness.     Left Sinus: No maxillary sinus tenderness or frontal sinus tenderness.     Mouth/Throat:     Pharynx: Uvula midline.  Eyes:     General: Lids are normal. Lids are everted, no foreign bodies appreciated.     Conjunctiva/sclera: Conjunctivae normal.     Pupils: Pupils are equal, round, and reactive to light.  Neck:     Thyroid : No thyroid  mass or thyromegaly.     Vascular: No carotid bruit.     Trachea: Trachea normal.  Cardiovascular:     Rate and Rhythm: Normal rate and regular rhythm.     Pulses: Normal pulses.     Heart sounds: Normal heart sounds, S1 normal and S2 normal. No murmur heard.    No friction rub. No gallop.  Pulmonary:     Effort: Pulmonary effort is normal. No tachypnea or respiratory distress.     Breath sounds: Normal breath sounds. No decreased breath sounds, wheezing, rhonchi or rales.  Abdominal:     General: Bowel sounds are normal.     Palpations: Abdomen is soft.     Tenderness: There is no abdominal tenderness.  Musculoskeletal:     Right elbow: Normal.     Right forearm: Tenderness and bony  tenderness present.     Right wrist: Normal.     Cervical back: Normal range of motion and neck supple.     Comments: Right knee stump tender with slight swelling on distal stump where patient hit directly, no contusion.     Right Lower Extremity: Right leg is amputated below knee.  Skin:    General: Skin is warm and dry.     Findings: No rash.  Neurological:  Mental Status: She is alert.  Psychiatric:        Mood and Affect: Mood is not anxious or depressed.        Speech: Speech normal.        Behavior: Behavior normal. Behavior is cooperative.        Thought Content: Thought content normal.        Judgment: Judgment normal.       Results for orders placed or performed in visit on 03/26/23  Lipid panel   Collection Time: 03/26/23 11:42 AM  Result Value Ref Range   Cholesterol 179 0 - 200 mg/dL   Triglycerides 06.9 0.0 - 149.0 mg/dL   HDL 49.79 >60.99 mg/dL   VLDL 81.3 0.0 - 59.9 mg/dL   LDL Cholesterol 889 (H) 0 - 99 mg/dL   Total CHOL/HDL Ratio 4    NonHDL 128.50   Hemoglobin A1c   Collection Time: 03/26/23 11:42 AM  Result Value Ref Range   Hgb A1c MFr Bld 5.6 4.6 - 6.5 %  Basic metabolic panel   Collection Time: 03/26/23 11:42 AM  Result Value Ref Range   Sodium 139 135 - 145 mEq/L   Potassium 4.7 3.5 - 5.1 mEq/L   Chloride 107 96 - 112 mEq/L   CO2 28 19 - 32 mEq/L   Glucose, Bld 98 70 - 99 mg/dL   BUN 10 6 - 23 mg/dL   Creatinine, Ser 9.17 0.40 - 1.20 mg/dL   GFR 27.57 >39.99 mL/min   Calcium  8.9 8.4 - 10.5 mg/dL  CBC with Differential   Collection Time: 03/26/23 11:42 AM  Result Value Ref Range   WBC 5.7 4.0 - 10.5 K/uL   RBC 4.37 3.87 - 5.11 Mil/uL   Hemoglobin 13.0 12.0 - 15.0 g/dL   HCT 59.9 63.9 - 53.9 %   MCV 91.6 78.0 - 100.0 fl   MCHC 32.5 30.0 - 36.0 g/dL   RDW 84.6 88.4 - 84.4 %   Platelets 316.0 150.0 - 400.0 K/uL   Neutrophils Relative % 62.6 43.0 - 77.0 %   Lymphocytes Relative 27.3 12.0 - 46.0 %   Monocytes Relative 5.1 3.0 - 12.0 %    Eosinophils Relative 3.9 0.0 - 5.0 %   Basophils Relative 1.1 0.0 - 3.0 %   Neutro Abs 3.6 1.4 - 7.7 K/uL   Lymphs Abs 1.6 0.7 - 4.0 K/uL   Monocytes Absolute 0.3 0.1 - 1.0 K/uL   Eosinophils Absolute 0.2 0.0 - 0.7 K/uL   Basophils Absolute 0.1 0.0 - 0.1 K/uL    Assessment and Plan  Fall, initial encounter Assessment & Plan: Acute, accidental fall without preceding symptoms in amputee  Orders: -     DG Knee Complete 4 Views Right; Future  Below-knee amputation of right lower extremity, sequela (HCC) Assessment & Plan: Acute injury to right lower extremity amputation at distal stump.  Will image to assess for fracture.  Most likely bone bruise.  Recommend ice and meloxicam  as needed for pain.   Orders: -     DG Knee Complete 4 Views Right; Future  Abnormal metabolism Assessment & Plan: Acute, she has noted difficulty losing weight.  We will evaluate with thyroid  function test.  Did discuss with patient decreased activity and current diet most likely contributing to difficulty losing weight.  Orders: -     T3, free; Future -     T4, free; Future -     TSH; Future  Right below-knee amputee [facility])  Right  arm pain Assessment & Plan: Acute, musculoskeletal strain Hold ibuprofen .  Try a trial of meloxicam  15 mg daily for pain and inflammation.  Also apply OTC Voltaren  gel 4 times daily to hands and wrists.     No follow-ups on file.   Greig Ring, MD
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
## 2023-06-16 ENCOUNTER — Ambulatory Visit: Payer: Self-pay | Admitting: Family Medicine

## 2023-06-16 ENCOUNTER — Encounter: Payer: Self-pay | Admitting: Internal Medicine

## 2023-06-16 NOTE — Telephone Encounter (Signed)
 Patient was returning call from voicemail received from [place] at practice. This RN connected patient to Abe Abed at the practice and no further questions needed at this time.

## 2023-06-16 NOTE — Telephone Encounter (Signed)
 Spoke with Sydney Evans.  She states Dr. Cherlyn Cornet was suppose to send in a prescription for her on Friday but the pharmacy doesn't have it .  I do not see that anything was prescribed on Friday.  Please advise.

## 2023-06-16 NOTE — Telephone Encounter (Signed)
 Copied from CRM [id number]. Topic: Clinical - Medication Question >> Jun 16, 2023  9:50 AM Ovid Blow wrote: Reason for CRM: Patient stated that Dr. Cherlyn Cornet was suppose to call her in a prescription for pain but medication is not at the drug store. Patients wants info on medication and for it to be sent to her pharmacy. Patient does not know the name of prescription   Patient calling for x-ray results. Results given to the patient. Patient verbalizes understanding. Patient voiced concern about possible nerve damage and would like to know if there is any testing that can be done to check for that.   Patient did not mention any medication request during call.    Reason for Disposition  Health Information question, no triage required and triager able to answer question  Answer Assessment - Initial Assessment Questions 1. REASON FOR CALL or QUESTION: "What is your reason for calling today?" or "How can I best help you?" or "What question do you have that I can help answer?"     Patient calling in regards to x-ray results  Protocols used: Information Only Call - No Triage-A-AH

## 2023-06-17 MED ORDER — MELOXICAM 15 MG PO TABS: 15.0000 mg | ORAL_TABLET | Freq: Every day | ORAL | 0 refills | Status: DC

## 2023-06-17 NOTE — Telephone Encounter (Signed)
It was for a trial of meloxicam. For the short term.. she can use this for joint pain if voltaren gel not helpful. Sent in RX.

## 2023-06-17 NOTE — Addendum Note (Signed)
Addended by: Kerby Nora E on: 06/17/2023 05:15 PM   Modules accepted: Orders

## 2023-06-17 NOTE — Telephone Encounter (Signed)
Sydney Evans notified by telephone that Dr. Ermalene Searing sent in a Rx for Meloxicam to her pharmacy to take one tablet daily for short term to use if the  Voltaren gel isn't helpful. Patient states understanding.

## 2023-06-18 ENCOUNTER — Telehealth: Payer: Self-pay

## 2023-06-18 DIAGNOSIS — Z89511 Acquired absence of right leg below knee: Secondary | ICD-10-CM

## 2023-06-18 DIAGNOSIS — M47812 Spondylosis without myelopathy or radiculopathy, cervical region: Secondary | ICD-10-CM

## 2023-06-18 DIAGNOSIS — M51362 Other intervertebral disc degeneration, lumbar region with discogenic back pain and lower extremity pain: Secondary | ICD-10-CM

## 2023-06-18 NOTE — Telephone Encounter (Signed)
Please place new PT referral in Epic.

## 2023-06-18 NOTE — Telephone Encounter (Signed)
Copied from CRM [id number]. Topic: Clinical - Home Health Verbal Orders >> Jun 18, 2023 11:10 AM Isabell A wrote: Patient and her insurance agent calling, patient recently switched to [employer] - requesting new authorization for home health physical therapy.  Contact number: [phone] [facility] fax number: [phone]

## 2023-06-19 ENCOUNTER — Ambulatory Visit: Payer: Self-pay | Admitting: Psychiatry

## 2023-06-20 NOTE — Addendum Note (Signed)
Addended by: Kerby Nora E on: 06/20/2023 04:55 PM   Modules accepted: Orders

## 2023-06-25 DIAGNOSIS — R948 Abnormal results of function studies of other organs and systems: Secondary | ICD-10-CM | POA: Insufficient documentation

## 2023-06-25 DIAGNOSIS — W19XXXA Unspecified fall, initial encounter: Secondary | ICD-10-CM | POA: Insufficient documentation

## 2023-06-25 DIAGNOSIS — M79641 Pain in right hand: Secondary | ICD-10-CM | POA: Insufficient documentation

## 2023-06-25 DIAGNOSIS — S88111A Complete traumatic amputation at level between knee and ankle, right lower leg, initial encounter: Secondary | ICD-10-CM | POA: Insufficient documentation

## 2023-06-25 DIAGNOSIS — M79601 Pain in right arm: Secondary | ICD-10-CM | POA: Insufficient documentation

## 2023-06-25 NOTE — Assessment & Plan Note (Signed)
Acute, musculoskeletal strain Hold ibuprofen.  Try a trial of meloxicam 15 mg daily for pain and inflammation.  Also apply OTC Voltaren gel 4 times daily to hands and wrists.

## 2023-06-25 NOTE — Assessment & Plan Note (Signed)
Acute, she has noted difficulty losing weight.  We will evaluate with thyroid function test.  Did discuss with patient decreased activity and current diet most likely contributing to difficulty losing weight.

## 2023-06-25 NOTE — Assessment & Plan Note (Addendum)
Acute injury to right lower extremity amputation at distal stump.  Will image to assess for fracture.  Most likely bone bruise.  Recommend ice and meloxicam as needed for pain.

## 2023-06-25 NOTE — Assessment & Plan Note (Signed)
Acute, accidental fall without preceding symptoms in amputee

## 2023-07-01 ENCOUNTER — Ambulatory Visit: Payer: Self-pay | Admitting: Family Medicine

## 2023-07-01 NOTE — Telephone Encounter (Signed)
Noted and agree with dispo. ?
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
## 2023-07-08 ENCOUNTER — Ambulatory Visit (INDEPENDENT_AMBULATORY_CARE_PROVIDER_SITE_OTHER): Payer: No Typology Code available for payment source | Admitting: Family Medicine

## 2023-07-08 VITALS — BP 128/74 | HR 52 | Temp 98.0°F

## 2023-07-08 DIAGNOSIS — R1084 Generalized abdominal pain: Secondary | ICD-10-CM | POA: Diagnosis not present

## 2023-07-08 DIAGNOSIS — R197 Diarrhea, unspecified: Secondary | ICD-10-CM | POA: Diagnosis not present

## 2023-07-08 DIAGNOSIS — S060X0A Concussion without loss of consciousness, initial encounter: Secondary | ICD-10-CM | POA: Insufficient documentation

## 2023-07-08 DIAGNOSIS — Z449 Encounter for fitting and adjustment of unspecified external prosthetic device: Secondary | ICD-10-CM

## 2023-07-08 DIAGNOSIS — W19XXXA Unspecified fall, initial encounter: Secondary | ICD-10-CM | POA: Diagnosis not present

## 2023-07-08 DIAGNOSIS — E782 Mixed hyperlipidemia: Secondary | ICD-10-CM

## 2023-07-08 DIAGNOSIS — S88111S Complete traumatic amputation at level between knee and ankle, right lower leg, sequela: Secondary | ICD-10-CM | POA: Diagnosis not present

## 2023-07-08 NOTE — Assessment & Plan Note (Signed)
 Acute, symptoms of abdominal pain and diarrhea most likely due to viral gastroenteritis but given continued issues this past 2 weeks will evaluate with labs and GI pathogen panel as well as C. difficile. Encouraged p.o. intake especially liquid intake.

## 2023-07-08 NOTE — Progress Notes (Addendum)
 Patient ID: Sydney Evans, female    DOB: 1950-07-14, 73 y.o.   MRN: [id number]  This visit was conducted in person.  BP 128/74 (BP Location: Left Arm, Patient Position: Sitting, Cuff Size: Normal)   Pulse (!) 52   Temp 98 F (36.7 C) (Temporal)   SpO2 92%    CC:  Chief Complaint  Patient presents with   Abdominal Pain    States she hasn't eaten in 12 to 13 days Tried to eat some toast today and it made her very sick States Stomach Meds are not helping.  Wants to go back on Omeprazole   Diarrhea   Fall    Subjective:   HPI: Sydney Evans is a 73 y.o. female presenting on 07/08/2023 for Abdominal Pain (States she hasn't eaten in 12 to 13 days/Tried to eat some toast today and it made her very sick/States Stomach Meds are not helping.  Wants to go back on Omeprazole), Diarrhea, and Fall  She presents for multiple issues as well evaluation of her prosthesis  She has been  having generalized abdominal pain x 2 week.  Associated with diarrhea, no emesis.[name] she is nauseous... 2 BMs today on immdoium.[name] watery. Dark black stool.  Has history of  upper GI bleeding.  She has been eating [employer] protein shake and taking electrolytes.. History of GERD: previously on protonix 40 mg BID No fever. Pepto bismol helps a tiny bit.  No change with abd pain with BM.  Poor po intake  Hx of Barrett's  Followed by [facility]. Wt Readings from Last 3 Encounters:  03/14/23 167 lb (75.8 kg)  01/31/23 167 lb 5.3 oz (75.9 kg)  12/24/22 161 lb (73 kg)     S/P cholecystectomy.    She fell on 2/25  fell accidentally getting out bed, lost balance, hit head... recommended ER.. pt did not go.    Some back and chest pain.  Now better.  After fall some headache.. improving but not gone.    Regarding her prosthesis.  She is highly motivated to walk with a prosthesis and is expected to do more than simple walking.  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  Patient is currently in multiple ply of socks. Sydney Evans prosthetic is showing signs of failure and will need replacing.  A new socket will help promote proper biomechanical gait and reduce energy expenditure. Sydney Evans a new prosthetic socket, liners and supplies that will fit her current limb shape.  The new prosthetic will allow her to continue her ADLs without skin breakdown or falls.   Patient is unable to care for herself adequately at home.  She is unable to easily transfer from bed to chair and continues to fall.  I will place a referral for home health for both nursing needs to assess that she is taking medication correctly as well as physical therapy to help with strengthening and transfers.  She also has not been able to take a bath in quite some time and needs assistance with activities of daily living.  Will place referral for home health nurse aide as well.  Relevant past medical, surgical, family and social history reviewed and updated as indicated. Interim medical history since our last visit reviewed. Allergies and medications reviewed and updated. Outpatient Medications Prior to Visit  Medication Sig Dispense Refill   acetaminophen (TYLENOL) 500 MG  tablet Take 500 mg by mouth every 6 (six) hours as needed.     albuterol (VENTOLIN HFA) 108 (90 Base) MCG/ACT inhaler Inhale 2 puffs into the lungs every 4 (four) hours as needed for shortness of breath. 1 each 0   amLODipine (NORVASC) 5 MG tablet Take 1 tablet (5 mg total) by mouth daily. 90 tablet 3   atorvastatin (LIPITOR) 40 MG tablet Take 0.5 tablets (20 mg total) by mouth daily. 90 tablet 3   carboxymethylcellulose (LUBRICANT EYE DROPS) 0.5 % SOLN Place 2 drops into both eyes daily as needed (Dry eye). Systane 30 mL 3   citalopram (CELEXA) 40 MG tablet Take 1 tablet (40 mg total) by mouth daily. 90 tablet 1   diphenoxylate-atropine  (LOMOTIL) 2.5-0.025 MG tablet Take 1-2 tablets by mouth 4 (four) times daily as needed for diarrhea or loose stools (max 8 tabs per day). 30 tablet 0   doxycycline (VIBRA-TABS) 100 MG tablet Take 1 tablet (100 mg total) by mouth 2 (two) times daily. 20 tablet 0   fluticasone (FLONASE) 50 MCG/ACT nasal spray Place 1 spray into both nostrils daily as needed for allergies or rhinitis. 16 g 5   gabapentin (NEURONTIN) 300 MG capsule Take two capsules TID and 2  capsule QHS 240 capsule 3   lamoTRIgine (LAMICTAL) 200 MG tablet Take 1 tablet (200 mg total) by mouth daily. 90 tablet 0   latanoprost (XALATAN) 0.005 % ophthalmic solution Place 1 drop into both eyes at bedtime. 2.5 mL 5   meloxicam (MOBIC) 15 MG tablet Take 1 tablet (15 mg total) by mouth daily. 30 tablet 0   methocarbamol (ROBAXIN) 500 MG tablet Take 1 tablet (500 mg total) by mouth 2 (two) times daily. 40 tablet 3   pantoprazole (PROTONIX) 40 MG tablet Take 1 tablet by mouth twice daily 180 tablet 0   propranolol (INDERAL) 20 MG tablet Take 1 tablet (20 mg total) by mouth daily as needed. 30 tablet 0   valACYclovir (VALTREX) 500 MG tablet Take 1 tablet (500 mg total) by mouth daily. 90 tablet 0   No facility-administered medications prior to visit.     Per HPI unless specifically indicated in ROS section below Review of Systems  Constitutional:  Positive for fatigue. Negative for fever.  HENT:  Negative for congestion.   Eyes:  Negative for pain.  Respiratory:  Negative for cough and shortness of breath.   Cardiovascular:  Negative for chest pain, palpitations and leg swelling.  Gastrointestinal:  Positive for diarrhea and nausea. Negative for abdominal pain and vomiting.  Genitourinary:  Negative for dysuria and vaginal bleeding.  Musculoskeletal:  Negative for back pain.  Neurological:  Positive for headaches. Negative for syncope and light-headedness.  Psychiatric/Behavioral:  Negative for dysphoric mood.    Objective:  BP  128/74 (BP Location: Left Arm, Patient Position: Sitting, Cuff Size: Normal)   Pulse (!) 52   Temp 98 F (36.7 C) (Temporal)   SpO2 92%   Wt Readings from Last 3 Encounters:  03/14/23 167 lb (75.8 kg)  01/31/23 167 lb 5.3 oz (75.9 kg)  12/24/22 161 lb (73 kg)      Physical Exam Exam conducted with a chaperone present.  Constitutional:      General: She is not in acute distress.    Appearance: Normal appearance. She is well-developed. She is not ill-appearing or toxic-appearing.  HENT:     Head: Normocephalic.     Right Ear: Hearing, tympanic membrane, ear canal and external ear  normal. Tympanic membrane is not erythematous, retracted or bulging.     Left Ear: Hearing, tympanic membrane, ear canal and external ear normal. Tympanic membrane is not erythematous, retracted or bulging.     Nose: No mucosal edema or rhinorrhea.     Right Sinus: No maxillary sinus tenderness or frontal sinus tenderness.     Left Sinus: No maxillary sinus tenderness or frontal sinus tenderness.     Mouth/Throat:     Pharynx: Uvula midline.  Eyes:     General: Lids are normal. Lids are everted, no foreign bodies appreciated.     Conjunctiva/sclera: Conjunctivae normal.     Pupils: Pupils are equal, round, and reactive to light.  Neck:     Thyroid: No thyroid mass or thyromegaly.     Vascular: No carotid bruit.     Trachea: Trachea normal.  Cardiovascular:     Rate and Rhythm: Normal rate and regular rhythm.     Pulses: Normal pulses.     Heart sounds: Normal heart sounds, S1 normal and S2 normal. No murmur heard.    No friction rub. No gallop.  Pulmonary:     Effort: Pulmonary effort is normal. No tachypnea or respiratory distress.     Breath sounds: Normal breath sounds. No decreased breath sounds, wheezing, rhonchi or rales.  Abdominal:     General: Bowel sounds are normal.     Palpations: Abdomen is soft.     Tenderness: There is no abdominal tenderness.  Genitourinary:    Rectum: Guaiac  result negative. No tenderness or external hemorrhoid.  Musculoskeletal:     Right elbow: Normal.     Right forearm: No tenderness or bony tenderness.     Right wrist: Normal.     Cervical back: Normal range of motion and neck supple.     Thoracic back: Tenderness present. Normal range of motion.     Lumbar back: No tenderness or bony tenderness. Negative right straight leg raise test and negative left straight leg raise test.     Comments: Right knee stump showing significant volume loss causing prosthetic socket and liners not to fit properly.     Right Lower Extremity: Right leg is amputated below knee.  Skin:    General: Skin is warm and dry.     Findings: No rash.  Neurological:     Mental Status: She is alert.  Psychiatric:        Mood and Affect: Mood is not anxious or depressed.        Speech: Speech normal.        Behavior: Behavior normal. Behavior is cooperative.        Thought Content: Thought content normal.        Judgment: Judgment normal.       Results for orders placed or performed in visit on 03/26/23  Lipid panel   Collection Time: 03/26/23 11:42 AM  Result Value Ref Range   Cholesterol 179 0 - 200 mg/dL   Triglycerides 16.1 0.0 - 149.0 mg/dL   HDL 09.60 >45.40 mg/dL   VLDL 98.1 0.0 - 19.1 mg/dL   LDL Cholesterol 478 (H) 0 - 99 mg/dL   Total CHOL/HDL Ratio 4    NonHDL 128.50   Hemoglobin A1c   Collection Time: 03/26/23 11:42 AM  Result Value Ref Range   Hgb A1c MFr Bld 5.6 4.6 - 6.5 %  Basic metabolic panel   Collection Time: 03/26/23 11:42 AM  Result Value Ref Range   Sodium 139  135 - 145 mEq/L   Potassium 4.7 3.5 - 5.1 mEq/L   Chloride 107 96 - 112 mEq/L   CO2 28 19 - 32 mEq/L   Glucose, Bld 98 70 - 99 mg/dL   BUN 10 6 - 23 mg/dL   Creatinine, Ser 1.61 0.40 - 1.20 mg/dL   GFR 09.60 >45.40 mL/min   Calcium 8.9 8.4 - 10.5 mg/dL  CBC with Differential   Collection Time: 03/26/23 11:42 AM  Result Value Ref Range   WBC 5.7 4.0 - 10.5 K/uL   RBC  4.37 3.87 - 5.11 Mil/uL   Hemoglobin 13.0 12.0 - 15.0 g/dL   HCT 98.1 19.1 - 47.8 %   MCV 91.6 78.0 - 100.0 fl   MCHC 32.5 30.0 - 36.0 g/dL   RDW 29.5 62.1 - 30.8 %   Platelets 316.0 150.0 - 400.0 K/uL   Neutrophils Relative % 62.6 43.0 - 77.0 %   Lymphocytes Relative 27.3 12.0 - 46.0 %   Monocytes Relative 5.1 3.0 - 12.0 %   Eosinophils Relative 3.9 0.0 - 5.0 %   Basophils Relative 1.1 0.0 - 3.0 %   Neutro Abs 3.6 1.4 - 7.7 K/uL   Lymphs Abs 1.6 0.7 - 4.0 K/uL   Monocytes Absolute 0.3 0.1 - 1.0 K/uL   Eosinophils Absolute 0.2 0.0 - 0.7 K/uL   Basophils Absolute 0.1 0.0 - 0.1 K/uL    Assessment and Plan  Acute diarrhea Assessment & Plan: Acute, symptoms of abdominal pain and diarrhea most likely due to viral gastroenteritis but given continued issues this past 2 weeks will evaluate with labs and GI pathogen panel as well as C. difficile. Encouraged p.o. intake especially liquid intake.   Generalized abdominal pain Assessment & Plan: Acute, possibly related to source of diarrhea and infectious gastroenteritis. Less likely peptic ulcer disease/pancreatitis/gastritis.  Stool sample today was negative for blood and is likely darker given Pepto-Bismol use. She can add Pepcid AC twice daily for possible gastritis, but patient denies any symptoms of reflux at this time.  Orders: -     CBC with Differential/Platelet; Future -     Lipase; Future -     Comprehensive metabolic panel; Future -     Gastrointestinal Pathogen Pnl RT, PCR; Future -     C. difficile GDH and Toxin A/B; Future  Below-knee amputation of right lower extremity, sequela (HCC) Assessment & Plan: She is highly motivated to walk with a prosthesis and is expected to do more than simple walking.  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  Patient is currently in multiple ply of socks. Deshanti's  prosthetic is showing signs of failure and will need replacing.  A new socket will help promote proper biomechanical gait and reduce energy expenditure. Briseyda Fehr Evans a new prosthetic socket, liners and supplies that will fit her current limb shape.  The new prosthetic will allow her to continue her ADLs without skin breakdown or falls.   Prosthesis adjustment  Fall, initial encounter  Concussion without loss of consciousness, initial encounter Assessment & Plan: Acute, mild, normal neurologic exam in office today.  Headache pain improving.  Recommend bed rest.  No imaging indicated at this time.   Mixed hyperlipidemia Assessment & Plan: Due for reevaluate of cholesterol control onl increased dose of  atorvastatin to 40 mg daily   Orders: -     Lipid  panel; Future    Return for  lab only tommorow.   Kerby Nora, MD

## 2023-07-08 NOTE — Assessment & Plan Note (Signed)
 Acute, mild, normal neurologic exam in office today.  Headache pain improving.  Recommend bed rest.  No imaging indicated at this time.

## 2023-07-08 NOTE — Patient Instructions (Addendum)
 Add Pepcid AC twice daily.  Increase water intake./ electrolytes  Return for labs and return GI pathogen panel sample.   Go to ER if unable to keep down liquids.

## 2023-07-08 NOTE — Assessment & Plan Note (Signed)
 She is highly motivated to walk with a prosthesis and is expected to do more than simple walking.  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  Patient is currently in multiple ply of socks. Sydney Evans's prosthetic is showing signs of failure and will need replacing.  A new socket will help promote proper biomechanical gait and reduce energy expenditure. Sydney Evans requires a new prosthetic socket, liners and supplies that will fit her current limb shape.  The new prosthetic will allow her to continue her ADLs without skin breakdown or falls.

## 2023-07-08 NOTE — Assessment & Plan Note (Signed)
 Acute, possibly related to source of diarrhea and infectious gastroenteritis. Less likely peptic ulcer disease/pancreatitis/gastritis.  Stool sample today was negative for blood and is likely darker given Pepto-Bismol use. She can add Pepcid AC twice daily for possible gastritis, but patient denies any symptoms of reflux at this time.

## 2023-07-08 NOTE — Assessment & Plan Note (Signed)
 Due for reevaluate of cholesterol control onl increased dose of  atorvastatin to 40 mg daily

## 2023-07-10 ENCOUNTER — Other Ambulatory Visit: Payer: Self-pay | Admitting: Family Medicine

## 2023-07-10 ENCOUNTER — Ambulatory Visit: Payer: Medicare HMO | Admitting: Family Medicine

## 2023-07-10 ENCOUNTER — Other Ambulatory Visit (INDEPENDENT_AMBULATORY_CARE_PROVIDER_SITE_OTHER)

## 2023-07-10 DIAGNOSIS — R1084 Generalized abdominal pain: Secondary | ICD-10-CM | POA: Diagnosis not present

## 2023-07-10 DIAGNOSIS — E782 Mixed hyperlipidemia: Secondary | ICD-10-CM

## 2023-07-10 DIAGNOSIS — R0609 Other forms of dyspnea: Secondary | ICD-10-CM

## 2023-07-11 ENCOUNTER — Encounter: Payer: Self-pay | Admitting: Family Medicine

## 2023-07-11 LAB — CBC WITH DIFFERENTIAL/PLATELET
Basophils Absolute: 0 10*3/uL (ref 0.0–0.1)
Basophils Relative: 1.3 % (ref 0.0–3.0)
Eosinophils Absolute: 0.1 10*3/uL (ref 0.0–0.7)
Eosinophils Relative: 1.9 % (ref 0.0–5.0)
HCT: 41.5 % (ref 36.0–46.0)
Hemoglobin: 13.9 g/dL (ref 12.0–15.0)
Lymphocytes Relative: 28.8 % (ref 12.0–46.0)
Lymphs Abs: 1.1 10*3/uL (ref 0.7–4.0)
MCHC: 33.4 g/dL (ref 30.0–36.0)
MCV: 89.3 fl (ref 78.0–100.0)
Monocytes Absolute: 0.4 10*3/uL (ref 0.1–1.0)
Monocytes Relative: 9.5 % (ref 3.0–12.0)
Neutro Abs: 2.2 10*3/uL (ref 1.4–7.7)
Neutrophils Relative %: 58.5 % (ref 43.0–77.0)
Platelets: 354 10*3/uL (ref 150.0–400.0)
RBC: 4.65 Mil/uL (ref 3.87–5.11)
RDW: 14.2 % (ref 11.5–15.5)
WBC: 3.7 10*3/uL — ABNORMAL LOW (ref 4.0–10.5)

## 2023-07-11 LAB — C. DIFFICILE GDH AND TOXIN A/B
GDH ANTIGEN: NOT DETECTED
MICRO NUMBER:: 16168325
SPECIMEN QUALITY:: ADEQUATE
TOXIN A AND B: NOT DETECTED

## 2023-07-11 LAB — COMPREHENSIVE METABOLIC PANEL
ALT: 10 U/L (ref 0–35)
AST: 15 U/L (ref 0–37)
Albumin: 4 g/dL (ref 3.5–5.2)
Alkaline Phosphatase: 74 U/L (ref 39–117)
BUN: 5 mg/dL — ABNORMAL LOW (ref 6–23)
CO2: 25 meq/L (ref 19–32)
Calcium: 9 mg/dL (ref 8.4–10.5)
Chloride: 103 meq/L (ref 96–112)
Creatinine, Ser: 0.63 mg/dL (ref 0.40–1.20)
GFR: 89.62 mL/min (ref >60.00)
Glucose, Bld: 83 mg/dL (ref 70–99)
Potassium: 3.9 meq/L (ref 3.5–5.1)
Sodium: 138 meq/L (ref 135–145)
Total Bilirubin: 0.5 mg/dL (ref 0.2–1.2)
Total Protein: 6.9 g/dL (ref 6.0–8.3)

## 2023-07-11 LAB — LIPID PANEL
Cholesterol: 190 mg/dL (ref 0–200)
HDL: 37.5 mg/dL — ABNORMAL LOW (ref >39.00)
LDL Cholesterol: 134 mg/dL — ABNORMAL HIGH (ref 0–99)
NonHDL: 152.97
Total CHOL/HDL Ratio: 5
Triglycerides: 93 mg/dL (ref 0.0–149.0)
VLDL: 18.6 mg/dL (ref 0.0–40.0)

## 2023-07-11 LAB — LIPASE: Lipase: 12 U/L (ref 11.0–59.0)

## 2023-07-12 LAB — GASTROINTESTINAL PATHOGEN PNL
CampyloBacter Group: NOT DETECTED
Norovirus GI/GII: NOT DETECTED
Rotavirus A: NOT DETECTED
Salmonella species: NOT DETECTED
Shiga Toxin 1: NOT DETECTED
Shiga Toxin 2: NOT DETECTED
Shigella Species: NOT DETECTED
Vibrio Group: NOT DETECTED
Yersinia enterocolitica: NOT DETECTED

## 2023-07-14 ENCOUNTER — Ambulatory Visit: Payer: Self-pay | Admitting: Family Medicine

## 2023-07-14 DIAGNOSIS — R1084 Generalized abdominal pain: Secondary | ICD-10-CM

## 2023-07-14 NOTE — Telephone Encounter (Signed)
 Copied from CRM [id number]. Topic: Clinical - Red Word Triage >> Jul 14, 2023  1:29 PM [place] J wrote: [facility] that prompted transfer to Nurse Triage: Patient is having a lot of stomach pain from not being able to eat. Patient has no appetite and cannot help herself to cook. Reason for Disposition  [1] SEVERE pain AND [2] age > 60 years  Answer Assessment - Initial Assessment Questions 1. LOCATION: "Where does it hurt?"      Pt has no appetite.   I went 28 days without eating or drinking.  Sometimes I'll drink a protein drink and it takes 3 days to drink it.   Yesterday was the first time I was able to eat.   I ate and now I'm in pain again.   I think it's starting over again.   I told my doctor and she did blood work.   I haven't heard back to see what the results were.   I don't want to go another 28 days without eating.    Today I'm in such pain.   I'm not [place].   This is what I've been going through since Feb. The pain is in my stomach.   I'm very weak.   I'm shaking so bad I can't hardly hold a coffee cup because I'm so weak.    It takes me all day to drink something but I'm not eating again.   I don't want to go to the ED.   I don't want to catch something else.    I suggested that she return to the ED as instructed by Dr. Ermalene Searing in her note from today 07/14/2023 at 1:23 PM.   I've not had a shower in 2 months.   I can't wash myself.   I can't get into the tub on my own.     I let pt know I would pass this information along to Dr. Ermalene Searing and see what she recommends.   Pt was agreeable to this.  2. RADIATION: "Does the pain shoot anywhere else?" (e.g., chest, back)     No 3. ONSET: "When did the pain begin?" (e.g., minutes, hours or days ago)      "I can't eat again".    "It hurts".    "I don't want to go another 28 days without eating again".   4. SUDDEN: "Gradual or sudden onset?"     gradually 5. PATTERN "Does the pain come and go, or is it constant?"    - If it comes and goes: "How  long does it last?" "Do you have pain now?"     (Note: Comes and goes means the pain is intermittent. It goes away completely between bouts.)    - If constant: "Is it getting better, staying the same, or getting worse?"      (Note: Constant means the pain never goes away completely; most serious pain is constant and gets worse.)      "I can't eat again". 6. SEVERITY: "How bad is the pain?"  (e.g., Scale 1-10; mild, moderate, or severe)    - MILD (1-3): Doesn't interfere with normal activities, abdomen soft and not tender to touch.     - MODERATE (4-7): Interferes with normal activities or awakens from sleep, abdomen tender to touch.     - SEVERE (8-10): Excruciating pain, doubled over, unable to do any normal activities.       Not asked 7. RECURRENT SYMPTOM: "Have you ever had this type  of stomach pain before?" If Yes, ask: "When was the last time?" and "What happened that time?"      Yes this has been going on. 8. CAUSE: "What do you think is causing the stomach pain?"     "They don't know".    9. RELIEVING/AGGRAVATING FACTORS: "What makes it better or worse?" (e.g., antacids, bending or twisting motion, bowel movement)     "It hurts to eat and it takes me all day to drink something like a protein drink".   10. OTHER SYMPTOMS: "Do you have any other symptoms?" (e.g., back pain, diarrhea, fever, urination pain, vomiting)       Nothing mentioned. 11. PREGNANCY: "Is there any chance you are pregnant?" "When was your last menstrual period?"       N/A due to age  Protocols used: Abdominal Pain - Female-A-AH  Chief Complaint: "It hurts to eat again".   "I don't want to go another 28 days without eating again".   She asked about her test results.   I read her the last 2 messages from Dr. Ermalene Searing.   She repeatedly said this.  I read her the message from Dr. Ermalene Searing dated today 07/14/2023 at 1:23 PM regarding the test results.   I recommended she return to the ED as mentioned in Dr. Daphine Deutscher message.    She is refusing to return to the ED.   Symptoms: "It takes me 3 days to drink maybe one protein shake or anything".   "I've not had a shower in 2 months".   See notes for more details. Frequency: Daily not eating.   Pertinent Negatives: Patient denies having any appetite again Disposition: [] ED /[] Urgent Care (no appt availability in office) / [] Appointment(In office/virtual)/ []  [facility]/ [] Home Care/ [] Refused Recommended Disposition /[] [place]/ [x]  Follow-up with PCP Additional Notes: Message being sent making Dr. Ermalene Searing aware of the situation.    Pt. Was agreeable to someone calling her back with what Dr. Ermalene Searing recommends since she is refusing to return to the ED.

## 2023-07-14 NOTE — Telephone Encounter (Signed)
 I think she needs to be checked in the office tomorrow for abdominal pain.

## 2023-07-15 ENCOUNTER — Telehealth: Payer: Self-pay | Admitting: Family Medicine

## 2023-07-15 DIAGNOSIS — R197 Diarrhea, unspecified: Secondary | ICD-10-CM

## 2023-07-15 DIAGNOSIS — S88111S Complete traumatic amputation at level between knee and ankle, right lower leg, sequela: Secondary | ICD-10-CM

## 2023-07-15 DIAGNOSIS — R1084 Generalized abdominal pain: Secondary | ICD-10-CM

## 2023-07-15 DIAGNOSIS — R531 Weakness: Secondary | ICD-10-CM

## 2023-07-15 NOTE — Addendum Note (Signed)
 Addended by: Kerby Nora E on: 07/15/2023 04:51 PM   Modules accepted: Orders

## 2023-07-15 NOTE — Telephone Encounter (Signed)
 I have ordered the Abd pelvic CT stat to be done hopefully tommorow.  I do not have any forms... does she need a new referral?

## 2023-07-15 NOTE — Telephone Encounter (Signed)
 Call patient.  Workup was unremarkable.  Negative GI pathogen panel, C. difficile test, normal complete metabolic panel with normal liver and kidney function, normal lipase with no pancreatitis, CBC with no suggestion of blood loss and only very slightly low white blood cell count.  If she is continuing to have generalized abdominal pain I would suggest moving forward with an abdominal/pelvic CT scan if she is agreeable.  Also it appears she has seen GI in the past she may want a call for follow-up over there. Make sure she has stopped the meloxicam as this can cause irritation of the stomach. Make sure she is keeping liquids up... If she is not she needs to go to ER for IV fluids.

## 2023-07-15 NOTE — Telephone Encounter (Signed)
 Please call is new order needed.   I do not recall patient giving me any forms at last OV. None in  my inbox

## 2023-07-15 NOTE — Telephone Encounter (Signed)
 A new referral will need to be placed since change in insurance.

## 2023-07-15 NOTE — Telephone Encounter (Signed)
 Copied from CRM [id number]. Topic: General - Other >> Jul 15, 2023 10:49 AM Isabell A wrote: Reason for CRM: Patient calling with her insurance agent on the line - in regards to home health orders the patient is needing, she's switched to a new carrier [facility]) and is needing a new authorization.   Callback number: [phone]) [phone]   Fax no: [phone] (providers service number for [facility])

## 2023-07-15 NOTE — Telephone Encounter (Signed)
 Ms. Blondin notified as instructed by telephone.  She is agreeable to CT of Abdomen/Pelvis.  Any location is fine.  Needs late afternoon appointment.  She states she is able to drink a bottle of water a day with electrolytes in it but it takes her all day to drink it.  She is also asking about forms she gave Dr. Ermalene Searing on Friday about Home Health Aide.  She states she has not been able to take a shower in 2 months.

## 2023-07-15 NOTE — Addendum Note (Signed)
 Addended by: Kerby Nora E on: 07/15/2023 04:57 PM   Modules accepted: Orders

## 2023-07-15 NOTE — Telephone Encounter (Signed)
Yes please place new referral

## 2023-07-16 ENCOUNTER — Telehealth: Payer: Self-pay | Admitting: Family Medicine

## 2023-07-16 NOTE — Telephone Encounter (Signed)
 Copied from CRM [id number]. Topic: General - Other >> Jul 16, 2023 12:43 PM Pascal Lux wrote: Reason for CRM: Patient stated she has a CT scan tomorrow and would like for them to have her right leg that's amputated x-ray if it's possible.

## 2023-07-17 ENCOUNTER — Encounter: Payer: Self-pay | Admitting: Family Medicine

## 2023-07-17 ENCOUNTER — Ambulatory Visit
Admission: RE | Admit: 2023-07-17 | Discharge: 2023-07-17 | Disposition: A | Discharge status: Home / Self Care | Source: Ambulatory Visit | Attending: Family Medicine | Admitting: Family Medicine

## 2023-07-17 DIAGNOSIS — K8689 Other specified diseases of pancreas: Secondary | ICD-10-CM | POA: Diagnosis not present

## 2023-07-17 DIAGNOSIS — K5730 Diverticulosis of large intestine without perforation or abscess without bleeding: Secondary | ICD-10-CM | POA: Diagnosis not present

## 2023-07-17 DIAGNOSIS — R1084 Generalized abdominal pain: Secondary | ICD-10-CM | POA: Diagnosis not present

## 2023-07-17 MED ORDER — IOHEXOL 300 MG/ML  SOLN
100.0000 mL | Freq: Once | INTRAMUSCULAR | Status: AC | PRN
  Administered 2023-07-17: 100 mL via INTRAVENOUS

## 2023-07-17 NOTE — Telephone Encounter (Signed)
 Copied from CRM [id number]. Topic: Referral - Question >> Jul 17, 2023 10:44 AM Sonny Dandy B wrote: Reason for CRM:Pt called to request a referral for Home health, Pt services in home. Is not getting any services. Needs help. Please call pt back at [phone] pt is requesting [facility] fax number is [phone]

## 2023-07-17 NOTE — Telephone Encounter (Signed)
 Sydney Evans notified as instructed by telephone.  Patient states understanding.  While on the phone she states the PT referral was placed at outpatient but needs to be changed for [facility] PT.  She is unable to go to outpatient setting for PT.

## 2023-07-17 NOTE — Telephone Encounter (Signed)
 See below

## 2023-07-17 NOTE — Telephone Encounter (Signed)
 Referral for home health placed 07/15/2023.  Let patient know this is in process

## 2023-07-17 NOTE — Telephone Encounter (Signed)
 Copied from CRM [id number]. Topic: Clinical - Request for Lab/Test Order >> Jul 16, 2023  4:42 PM Florestine Avers wrote: Reason for CRM: Patient is requesting a CT of her right leg, from the leg down to the numb due to her having an amputation. She is already having imaging done. She is requesting a call back.

## 2023-07-18 NOTE — Telephone Encounter (Signed)
 This will be handled within the [facility] referral.   See referral for updates.

## 2023-07-29 ENCOUNTER — Encounter: Payer: Self-pay | Admitting: Family Medicine

## 2023-07-29 ENCOUNTER — Ambulatory Visit (INDEPENDENT_AMBULATORY_CARE_PROVIDER_SITE_OTHER): Admitting: Family Medicine

## 2023-07-29 ENCOUNTER — Ambulatory Visit (INDEPENDENT_AMBULATORY_CARE_PROVIDER_SITE_OTHER)
Admission: RE | Admit: 2023-07-29 | Discharge: 2023-07-29 | Disposition: A | Discharge status: Home / Self Care | Source: Ambulatory Visit | Attending: Family Medicine | Admitting: Family Medicine

## 2023-07-29 VITALS — BP 158/86 | HR 51 | Temp 98.6°F

## 2023-07-29 DIAGNOSIS — Z96651 Presence of right artificial knee joint: Secondary | ICD-10-CM | POA: Diagnosis not present

## 2023-07-29 DIAGNOSIS — R296 Repeated falls: Secondary | ICD-10-CM

## 2023-07-29 DIAGNOSIS — G8929 Other chronic pain: Secondary | ICD-10-CM

## 2023-07-29 DIAGNOSIS — T879 Unspecified complications of amputation stump: Secondary | ICD-10-CM

## 2023-07-29 DIAGNOSIS — K219 Gastro-esophageal reflux disease without esophagitis: Secondary | ICD-10-CM

## 2023-07-29 DIAGNOSIS — S72401A Unspecified fracture of lower end of right femur, initial encounter for closed fracture: Secondary | ICD-10-CM | POA: Diagnosis not present

## 2023-07-29 DIAGNOSIS — M25561 Pain in right knee: Secondary | ICD-10-CM | POA: Diagnosis not present

## 2023-07-29 MED ORDER — DULOXETINE HCL 60 MG PO CPEP
60.0000 mg | ORAL_CAPSULE | Freq: Every day | ORAL | 3 refills | Status: DC
Start: 2023-07-29 — End: 2023-12-18

## 2023-07-29 NOTE — Progress Notes (Signed)
 Patient ID: Sydney Evans, female    DOB: June 04, 1950, 73 y.o.   MRN: [id number]  This visit was conducted in person.  BP (!) 158/86 (BP Location: Left Arm, Patient Position: Sitting, Cuff Size: Normal)   Pulse (!) 51   Temp 98.6 F (37 C) (Temporal)   SpO2 96%    CC:  Chief Complaint  Patient presents with   Fall   Stump Pain    Subjective:   HPI: Sydney Evans is a 73 y.o. female presenting on 07/29/2023 for Fall and Stump Pain    Recent fall on 2/4 lost balance, slipped forward out of chair.. she landed on right leg stump.   Has  mild swelling,  no bruising cannot get on prosthetic on given pain.   Has fallen again in same way. 4 more times... slipping forward or sideways.  Stomach ache with food... taking Tums with meal.. this helps some.  Not eating much. Eats 2 days then not again for 1 days.  Drinking  lots of water, coffee and green tea.   No further diarrhea.  Body pain, upper body.   No pain in stump  at rest, but pain with palpations and  crossing legs.   2 areas of bruising.   Wt Readings from Last 3 Encounters:  08/12/23 150 lb (68 kg)  08/06/23 150 lb (68 kg)  03/14/23 167 lb (75.8 kg)     No gallbladder.   Pantoprazole 40 mg BID.  Not on meloxicam as  insurance not covering  Not using ibuprofen much.   GI Dr. Mia Creek.    She also has noted a decrease in metabolism .[name] Cannot loose weight in abdomen.     She has had several years has had pain in right bicep pain, pain with movement.  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 Interim medical history since our last visit reviewed. Allergies and medications reviewed and updated. No facility-administered medications prior to visit.   Outpatient Medications Prior to Visit  Medication Sig Dispense Refill   acetaminophen (TYLENOL) 500 MG tablet Take 500 mg by mouth every 6 (six) hours as needed.      albuterol (VENTOLIN HFA) 108 (90 Base) MCG/ACT inhaler INHALE 2 PUFFS BY MOUTH EVERY 4 HOURS AS NEEDED FOR SHORTNESS OF BREATH 9 g 0   amLODipine (NORVASC) 5 MG tablet Take 1 tablet (5 mg total) by mouth daily. 90 tablet 3   atorvastatin (LIPITOR) 40 MG tablet Take 0.5 tablets (20 mg total) by mouth daily. 90 tablet 3   carboxymethylcellulose (LUBRICANT EYE DROPS) 0.5 % SOLN Place 2 drops into both eyes daily as needed (Dry eye). Systane 30 mL 3   fluticasone (FLONASE) 50 MCG/ACT nasal spray Place 1 spray into both nostrils daily as needed for allergies or rhinitis. 16 g 5   gabapentin (NEURONTIN) 300 MG capsule Take two capsules TID and 2  capsule QHS 240 capsule 3   latanoprost (XALATAN) 0.005 % ophthalmic solution Place 1 drop into both eyes at bedtime. 2.5 mL 5   methocarbamol (ROBAXIN) 500 MG tablet Take 1 tablet (500 mg total) by mouth 2 (two) times daily. 40 tablet 3   propranolol (INDERAL) 20 MG tablet Take 1 tablet (20 mg total) by mouth daily as needed. 30 tablet 0   citalopram (CELEXA) 40 MG tablet Take 1 tablet (40 mg total) by mouth daily. 90 tablet 1  ibuprofen (ADVIL) 800 MG tablet Take 800 mg by mouth every 8 (eight) hours as needed.     meloxicam (MOBIC) 15 MG tablet Take 1 tablet (15 mg total) by mouth daily. 30 tablet 0   pantoprazole (PROTONIX) 40 MG tablet Take 1 tablet by mouth twice daily 180 tablet 0   valACYclovir (VALTREX) 500 MG tablet Take 1 tablet (500 mg total) by mouth daily. 90 tablet 0   lamoTRIgine (LAMICTAL) 200 MG tablet Take 1 tablet (200 mg total) by mouth daily. 90 tablet 0   doxycycline (VIBRA-TABS) 100 MG tablet Take 1 tablet (100 mg total) by mouth 2 (two) times daily. 20 tablet 0     Per HPI unless specifically indicated in ROS section below Review of Systems  Constitutional:  Negative for fatigue and fever.  HENT:  Negative for congestion.   Eyes:  Negative for pain.  Respiratory:  Negative for cough and shortness of breath.   Cardiovascular:   Negative for chest pain, palpitations and leg swelling.  Gastrointestinal:  Negative for abdominal pain.  Genitourinary:  Negative for dysuria and vaginal bleeding.  Musculoskeletal:  Negative for back pain.  Neurological:  Negative for syncope, light-headedness and headaches.  Psychiatric/Behavioral:  Negative for dysphoric mood.    Objective:  BP (!) 158/86 (BP Location: Left Arm, Patient Position: Sitting, Cuff Size: Normal)   Pulse (!) 51   Temp 98.6 F (37 C) (Temporal)   SpO2 96%   Wt Readings from Last 3 Encounters:  08/12/23 150 lb (68 kg)  08/06/23 150 lb (68 kg)  03/14/23 167 lb (75.8 kg)      Physical Exam Constitutional:      General: She is not in acute distress.    Appearance: Normal appearance. She is well-developed. She is not ill-appearing or toxic-appearing.  HENT:     Head: Normocephalic.     Right Ear: Hearing, tympanic membrane, ear canal and external ear normal. Tympanic membrane is not erythematous, retracted or bulging.     Left Ear: Hearing, tympanic membrane, ear canal and external ear normal. Tympanic membrane is not erythematous, retracted or bulging.     Nose: No mucosal edema or rhinorrhea.     Right Sinus: No maxillary sinus tenderness or frontal sinus tenderness.     Left Sinus: No maxillary sinus tenderness or frontal sinus tenderness.     Mouth/Throat:     Pharynx: Uvula midline.  Eyes:     General: Lids are normal. Lids are everted, no foreign bodies appreciated.     Conjunctiva/sclera: Conjunctivae normal.     Pupils: Pupils are equal, round, and reactive to light.  Neck:     Thyroid: No thyroid mass or thyromegaly.     Vascular: No carotid bruit.     Trachea: Trachea normal.  Cardiovascular:     Rate and Rhythm: Normal rate and regular rhythm.     Pulses: Normal pulses.     Heart sounds: Normal heart sounds, S1 normal and S2 normal. No murmur heard.    No friction rub. No gallop.  Pulmonary:     Effort: Pulmonary effort is normal. No  tachypnea or respiratory distress.     Breath sounds: Normal breath sounds. No decreased breath sounds, wheezing, rhonchi or rales.  Abdominal:     General: Bowel sounds are normal.     Palpations: Abdomen is soft.     Tenderness: There is no abdominal tenderness.  Musculoskeletal:     Right elbow: Normal.     Right  forearm: Tenderness and bony tenderness present.     Right wrist: Normal.     Cervical back: Normal range of motion and neck supple.     Comments: Right knee stump tender with slight swelling on distal stump where patient hit directly, no contusion.     Right Lower Extremity: Right leg is amputated below knee.  Skin:    General: Skin is warm and dry.     Findings: No rash.  Neurological:     Mental Status: She is alert.  Psychiatric:        Mood and Affect: Mood is not anxious or depressed.        Speech: Speech normal.        Behavior: Behavior normal. Behavior is cooperative.        Thought Content: Thought content normal.        Judgment: Judgment normal.       Results for orders placed or performed in visit on 07/10/23  Lipid panel   Collection Time: 07/10/23  4:05 PM  Result Value Ref Range   Cholesterol 190 0 - 200 mg/dL   Triglycerides 82.9 0.0 - 149.0 mg/dL   HDL 56.21 (L) >30.86 mg/dL   VLDL 57.8 0.0 - 46.9 mg/dL   LDL Cholesterol 629 (H) 0 - 99 mg/dL   Total CHOL/HDL Ratio 5    NonHDL 152.97   Comprehensive metabolic panel   Collection Time: 07/10/23  4:05 PM  Result Value Ref Range   Sodium 138 135 - 145 mEq/L   Potassium 3.9 3.5 - 5.1 mEq/L   Chloride 103 96 - 112 mEq/L   CO2 25 19 - 32 mEq/L   Glucose, Bld 83 70 - 99 mg/dL   BUN 5 (L) 6 - 23 mg/dL   Creatinine, Ser 5.28 0.40 - 1.20 mg/dL   Total Bilirubin 0.5 0.2 - 1.2 mg/dL   Alkaline Phosphatase 74 39 - 117 U/L   AST 15 0 - 37 U/L   ALT 10 0 - 35 U/L   Total Protein 6.9 6.0 - 8.3 g/dL   Albumin 4.0 3.5 - 5.2 g/dL   GFR 41.32 >44.01 mL/min   Calcium 9.0 8.4 - 10.5 mg/dL  Lipase    Collection Time: 07/10/23  4:05 PM  Result Value Ref Range   Lipase 12.0 11.0 - 59.0 U/L  CBC with Differential/Platelet   Collection Time: 07/10/23  4:05 PM  Result Value Ref Range   WBC 3.7 (L) 4.0 - 10.5 K/uL   RBC 4.65 3.87 - 5.11 Mil/uL   Hemoglobin 13.9 12.0 - 15.0 g/dL   HCT 02.7 25.3 - 66.4 %   MCV 89.3 78.0 - 100.0 fl   MCHC 33.4 30.0 - 36.0 g/dL   RDW 40.3 47.4 - 25.9 %   Platelets 354.0 150.0 - 400.0 K/uL   Neutrophils Relative % 58.5 43.0 - 77.0 %   Lymphocytes Relative 28.8 12.0 - 46.0 %   Monocytes Relative 9.5 3.0 - 12.0 %   Eosinophils Relative 1.9 0.0 - 5.0 %   Basophils Relative 1.3 0.0 - 3.0 %   Neutro Abs 2.2 1.4 - 7.7 K/uL   Lymphs Abs 1.1 0.7 - 4.0 K/uL   Monocytes Absolute 0.4 0.1 - 1.0 K/uL   Eosinophils Absolute 0.1 0.0 - 0.7 K/uL   Basophils Absolute 0.0 0.0 - 0.1 K/uL  C. difficile GDH and Toxin A/B   Collection Time: 07/10/23  4:05 PM  Result Value Ref Range   MICRO NUMBER: [id number]  SPECIMEN QUALITY: Adequate    Source STOOL    STATUS: FINAL    GDH ANTIGEN Not Detected    TOXIN A AND B Not Detected    COMMENT      No toxigenic C. difficile detected For additional information, please refer to http://education.[facility].com/faq/FAQ136 (This link is being provided for informational/educational purposes only.)  Gastrointestinal Pathogen Pnl RT, PCR   Collection Time: 07/10/23  4:05 PM  Result Value Ref Range   CampyloBacter Group NOT DETECTED NOT DETECTED   Salmonella species NOT DETECTED NOT DETECTED   Shigella Species NOT DETECTED NOT DETECTED   Vibrio Group NOT DETECTED NOT DETECTED   Yersinia enterocolitica NOT DETECTED NOT DETECTED   Shiga Toxin 1 NOT DETECTED NOT DETECTED   Shiga Toxin 2 NOT DETECTED NOT DETECTED   Norovirus GI/GII NOT DETECTED NOT DETECTED   Rotavirus A NOT DETECTED NOT DETECTED    Assessment and Plan  Frequent falls Assessment & Plan: Chronic, recurrent.  Discussed in detail ways to avoid slipping forward  in chair or commode. Poor upper body strength likely because of frequent falls but she is unable to support herself and transfer easily..  Daughter states she tries to do this frequently on her own without assistance.   Recommended consideration of physical therapy to strengthen upper body.    Orders: -     DG Knee Complete 4 Views Right; Future  BKA stump complication (HCC) Assessment & Plan: Acute, she would like to return to using prosthesis but her stump is sore and therefore cannot fit the prosthesis. Recommended return to orthopedics for recommendations although likely bruising just needs time to heal.  X-ray several weeks ago was within normal limits and no evidence of fracture on exam in office today.  Orders: -     DG Knee Complete 4 Views Right; Future  Gastroesophageal reflux disease without esophagitis Assessment & Plan: Do not take ibuprofen.  Add  Pepcid AC twice daily for possible stomach irritation.  Avoid acidic trigger.   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 Change celexa to Cymbalta  60 mg daily for mood as well as chronic body pain.    Other orders -     DULoxetine HCl; Take 1 capsule (60 mg total) by mouth daily.  Dispense: 30 capsule; Refill: 3    Return in about 2 weeks (around 08/12/2023) for  follow up multiple issues.Kerby Nora, MD

## 2023-07-29 NOTE — Patient Instructions (Addendum)
 Do not take ibuprofen.  Add  Pepcid AC twice daily for possible stomach irritation.  Avoid acidic trigger.  ES tylenol 2 capsule three daily.  Can try increasing Cymbalta to 60 mg daily.  Call Ortho Dr. Micah Noel to get input of  stump pain if not improving.

## 2023-08-05 ENCOUNTER — Encounter: Payer: Self-pay | Admitting: Internal Medicine

## 2023-08-06 ENCOUNTER — Emergency Department

## 2023-08-06 ENCOUNTER — Other Ambulatory Visit: Payer: Self-pay

## 2023-08-06 ENCOUNTER — Emergency Department
Admission: EM | Admit: 2023-08-06 | Discharge: 2023-08-06 | Disposition: A | Discharge status: Home / Self Care | Attending: Emergency Medicine | Admitting: Emergency Medicine

## 2023-08-06 DIAGNOSIS — W19XXXA Unspecified fall, initial encounter: Secondary | ICD-10-CM | POA: Diagnosis not present

## 2023-08-06 DIAGNOSIS — I10 Essential (primary) hypertension: Secondary | ICD-10-CM | POA: Diagnosis not present

## 2023-08-06 DIAGNOSIS — Z8673 Personal history of transient ischemic attack (TIA), and cerebral infarction without residual deficits: Secondary | ICD-10-CM | POA: Insufficient documentation

## 2023-08-06 DIAGNOSIS — R220 Localized swelling, mass and lump, head: Secondary | ICD-10-CM | POA: Diagnosis not present

## 2023-08-06 DIAGNOSIS — S42351A Displaced comminuted fracture of shaft of humerus, right arm, initial encounter for closed fracture: Secondary | ICD-10-CM | POA: Diagnosis not present

## 2023-08-06 DIAGNOSIS — S4991XA Unspecified injury of right shoulder and upper arm, initial encounter: Secondary | ICD-10-CM | POA: Diagnosis present

## 2023-08-06 DIAGNOSIS — W050XXA Fall from non-moving wheelchair, initial encounter: Secondary | ICD-10-CM | POA: Diagnosis not present

## 2023-08-06 DIAGNOSIS — M85821 Other specified disorders of bone density and structure, right upper arm: Secondary | ICD-10-CM | POA: Diagnosis not present

## 2023-08-06 DIAGNOSIS — S00511A Abrasion of lip, initial encounter: Secondary | ICD-10-CM | POA: Insufficient documentation

## 2023-08-06 DIAGNOSIS — M79601 Pain in right arm: Secondary | ICD-10-CM | POA: Insufficient documentation

## 2023-08-06 DIAGNOSIS — Z743 Need for continuous supervision: Secondary | ICD-10-CM | POA: Diagnosis not present

## 2023-08-06 DIAGNOSIS — R6889 Other general symptoms and signs: Secondary | ICD-10-CM | POA: Diagnosis not present

## 2023-08-06 DIAGNOSIS — S42341A Displaced spiral fracture of shaft of humerus, right arm, initial encounter for closed fracture: Secondary | ICD-10-CM | POA: Insufficient documentation

## 2023-08-06 DIAGNOSIS — I6782 Cerebral ischemia: Secondary | ICD-10-CM | POA: Diagnosis not present

## 2023-08-06 DIAGNOSIS — M47812 Spondylosis without myelopathy or radiculopathy, cervical region: Secondary | ICD-10-CM | POA: Diagnosis not present

## 2023-08-06 DIAGNOSIS — R58 Hemorrhage, not elsewhere classified: Secondary | ICD-10-CM | POA: Diagnosis not present

## 2023-08-06 DIAGNOSIS — S0990XA Unspecified injury of head, initial encounter: Secondary | ICD-10-CM | POA: Diagnosis not present

## 2023-08-06 DIAGNOSIS — J342 Deviated nasal septum: Secondary | ICD-10-CM | POA: Diagnosis not present

## 2023-08-06 DIAGNOSIS — S1191XA Laceration without foreign body of unspecified part of neck, initial encounter: Secondary | ICD-10-CM | POA: Insufficient documentation

## 2023-08-06 DIAGNOSIS — I499 Cardiac arrhythmia, unspecified: Secondary | ICD-10-CM | POA: Diagnosis not present

## 2023-08-06 DIAGNOSIS — Z96611 Presence of right artificial shoulder joint: Secondary | ICD-10-CM | POA: Diagnosis not present

## 2023-08-06 DIAGNOSIS — S129XXA Fracture of neck, unspecified, initial encounter: Secondary | ICD-10-CM | POA: Diagnosis not present

## 2023-08-06 MED ORDER — OXYCODONE-ACETAMINOPHEN 5-325 MG PO TABS
1.0000 | ORAL_TABLET | Freq: Once | ORAL | Status: AC
  Administered 2023-08-06: 1 via ORAL
  Filled 2023-08-06: qty 1

## 2023-08-06 MED ORDER — LIDOCAINE-EPINEPHRINE (PF) 2 %-1:200000 IJ SOLN
20.0000 mL | Freq: Once | INTRAMUSCULAR | Status: AC
  Administered 2023-08-06: 20 mL
  Filled 2023-08-06: qty 20

## 2023-08-06 MED ORDER — FENTANYL CITRATE PF 50 MCG/ML IJ SOSY: 50.0000 ug | PREFILLED_SYRINGE | Freq: Once | INTRAMUSCULAR | Status: DC

## 2023-08-06 MED ORDER — FENTANYL CITRATE PF 50 MCG/ML IJ SOSY
50.0000 ug | PREFILLED_SYRINGE | Freq: Once | INTRAMUSCULAR | Status: AC
  Administered 2023-08-06: 50 ug via INTRAMUSCULAR
  Filled 2023-08-06: qty 1

## 2023-08-06 MED ORDER — FENTANYL CITRATE PF 50 MCG/ML IJ SOSY
50.0000 ug | PREFILLED_SYRINGE | Freq: Once | INTRAMUSCULAR | Status: AC
  Administered 2023-08-06: 50 ug via INTRAVENOUS
  Filled 2023-08-06: qty 1

## 2023-08-06 MED ORDER — OXYCODONE-ACETAMINOPHEN 5-325 MG PO TABS: 1.0000 | ORAL_TABLET | ORAL | 0 refills | Status: DC | PRN

## 2023-08-06 NOTE — ED Triage Notes (Addendum)
 First nurse note: Pt here via [facility] from home with c/o of falling out of wheelchair (wheelchair bound) Pt has lac to R side of lower face. Lac to R side of lip, R elbow deformity.   170/78 96%  HR: 62  CBG 71  50 mcg fentanyl IM given by EMS

## 2023-08-06 NOTE — ED Triage Notes (Signed)
 Pt to ED from home [facility] for mechanical fall out of wheelchair (wheelchair bound, R BKA) onto kitchen floor. Pt had leaned over too far and slid our of wheelchair. Pt has deformity to R shoulder (in sling and states cannot lift R arm) and deep laceration to under R chin.  Pt states shoulder does not hurt but arm does hurt. Can move fingers. Hand warm, cap refill < 3 sec.  Pt also states prosthesis for R leg has not been fitting for last few months.   Pt states she believes upper arm is broken. States hx shoulder dislocation but does not think shoulder is dislocated.

## 2023-08-06 NOTE — ED Notes (Signed)
 IV team came while provider was suturing the pt's chin. They advised they would return afterwards.

## 2023-08-06 NOTE — ED Notes (Signed)
 Patient with laceration right underside of chin along the jaw line, bleeding is controlled at this time. Pt currently with a sling on right arm placed by EMS. Pt complains of 10/10 pain in her right arm. Pt is alert and oriented x4 with no other signs of acute distress at this time.

## 2023-08-06 NOTE — Discharge Instructions (Addendum)
 The x-ray of your right arm showed that you have broken your humerus.  The CT scans of your head, neck and face did not show any acute abnormalities.  I have attached information for Dr. Eulah Pont.  He is the orthopedic doctor that you will see for your broken arm.  He has you on his schedule for Friday morning at 8:30 am.  This is an office visit not surgery.  He will determine what needs to be done moving forward.  Please keep the splint applied to your arm clean and dry.  Make sure you are wearing the sling at all times.  The stitches in your neck will need to come out in 7 to 10 days.  This can be done by your primary care provider, urgent care or the emergency department.  Wash the wound with soap and water daily and pat dry.  You can then cover with a bandage if you prefer. watch for signs of infection including redness, warmth, swelling, pain and pus drainage.  If you develop any of these please return to the ED, urgent care or your primary care provider.  You can take the Percocet every 4 hours as needed for severe pain.  The Percocet also contains Tylenol so you can take Percocet or Tylenol but not both at the same time.  Percocet will make you sleepy so do not drive after taking it.  You can take 650 mg of Tylenol and 600 mg of ibuprofen every 6 hours as needed for mild to moderate pain.  Return to the emergency department if you are unable to control your pain at home, develop numbness in your fingertips or if your hand starts turning blue.

## 2023-08-06 NOTE — ED Notes (Signed)
 Called to [facility] per PA Lauren @830pm Norlene Campbell to Transfer/Orthopedic [employer].

## 2023-08-06 NOTE — ED Notes (Signed)
 Attempted an IV in the left hand however unsuccessful. EMS and RN tried prior to me and also unsuccessful. Pt transported to CT at this time. Consult put in to IV Team.

## 2023-08-06 NOTE — ED Provider Notes (Signed)
 [facility] Provider Note    Event Date/Time   First MD Initiated Contact with Patient 08/06/23 1851     (approximate)   History   Fall, Shoulder Injury, and Laceration   HPI  Sydney Evans is a 73 y.o. female with PMH of hypertension, stroke, s/p total shoulder replacement right side, right BKA, bipolar disorder who presents for evaluation after a fall.  Patient is wheelchair-bound and fell out of her wheelchair earlier today.  She was leaning forward and slid out of bed landing on her right side.  Patient reports significant pain to the right arm.  She also hit her face on something in the fall sustaining a laceration to the underside of her chin and lip.  She denies LOC.      Physical Exam   Triage Vital Signs: ED Triage Vitals  Encounter Vitals Group     BP 08/06/23 1829 (!) 165/77     Systolic BP Percentile --      Diastolic BP Percentile --      Pulse Rate 08/06/23 1829 (!) 58     Resp 08/06/23 1829 20     Temp 08/06/23 1829 98 F (36.7 C)     Temp Source 08/06/23 1829 Oral     SpO2 08/06/23 1829 98 %     Weight 08/06/23 1828 150 lb (68 kg)     Height 08/06/23 1828 5\' 1"  (1.549 m)     Head Circumference --      Peak Flow --      Pain Score 08/06/23 1824 10     Pain Loc --      Pain Education --      Exclude from Growth Chart --     Most recent vital signs: Vitals:   08/06/23 1829 08/06/23 2032  BP: (!) 165/77 125/64  Pulse: (!) 58 60  Resp: 20 18  Temp: 98 F (36.7 C)   SpO2: 98% 95%   General: Awake, in significant pain. CV:  Good peripheral perfusion.  Resp:  Normal effort.  Abd:  No distention.  R Arm:  Deformity to the upper arm, very TTP over the humeral shaft, no TTP at the shoulder or elbow, unable to perform ROM due to pain, radial pulse is 2+ and regular, sensation maintained in all dermatomes. Soft compartments. Neuro:  No focal neuro deficits, PERRL, EOM intact. Other:  Approximately 5 cm laceration below the  chin on the right side with exposed subcutaneous fat, bleeding controlled, abrasion to the right lower lip.   ED Results / Procedures / Treatments   Labs (all labs ordered are listed, but only abnormal results are displayed) Labs Reviewed - No data to display  RADIOLOGY  Right humerus x-ray, CT head, CT cervical spine and CT maxillofacial obtained.  I interpreted the images as well as reviewed the radiologist report.  X-ray of the right humerus shows an acute closed comminuted oblique/spiral midshaft fracture of the right humerus with a sizable butterfly fragment between the fracture margins and mild to moderate posterior and lateral angulation of the main distal fragment, as well as apex medial angulation of the main proximal fracture component.  Also shows osteopenia and right shoulder replacement hardware.  CT of the head, cervical spine and maxillofacial negative for acute intracranial findings, facial fractures, cervical fractures or traumatic listhesis.  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  There is asymmetric swelling in the left lower lip.  There  is evidence of osteopenia, degenerative change and straightened lordosis in the cervical spine.  Aortic and branch vessel atherosclerosis is present as well.  PROCEDURES:  Critical Care performed: No  .Laceration Repair  Date/Time: 08/06/2023 8:41 PM  Performed by: Cameron Ali, PA-C Authorized by: Cameron Ali, PA-C   Consent:    Consent obtained:  Verbal   Consent given by:  Patient   Risks, benefits, and alternatives were discussed: yes     Risks discussed:  Infection, pain, poor cosmetic result and poor wound healing   Alternatives discussed:  No treatment Universal protocol:    Patient identity confirmed:  Verbally with patient Anesthesia:    Anesthesia method:  Local infiltration   Local anesthetic:  Lidocaine 2% WITH epi Laceration details:    Location:  Neck    Neck location:  R anterior   Length (cm):  5   Depth (mm):  5 Pre-procedure details:    Preparation:  Patient was prepped and draped in usual sterile fashion and imaging obtained to evaluate for foreign bodies Exploration:    Hemostasis achieved with:  Direct pressure and epinephrine   Imaging obtained comment:  CT maxillofacial and cervical spine   Wound exploration: entire depth of wound visualized     Contaminated: no   Treatment:    Area cleansed with:  Povidone-iodine   Amount of cleaning:  Standard   Irrigation solution:  Sterile saline   Irrigation method:  Syringe Skin repair:    Repair method:  Sutures   Suture size:  5-0   Suture material:  Nylon   Suture technique:  Running   Number of sutures:  1 Approximation:    Approximation:  Close Repair type:    Repair type:  Simple Post-procedure details:    Dressing:  Open (no dressing)   Procedure completion:  Tolerated well, no immediate complications    MEDICATIONS ORDERED IN ED: Medications  lidocaine-EPINEPHrine (XYLOCAINE W/EPI) 2 %-1:200000 (PF) injection 20 mL (20 mLs Infiltration Given by Other 08/06/23 1944)  fentaNYL (SUBLIMAZE) injection 50 mcg (50 mcg Intramuscular Given 08/06/23 1942)  fentaNYL (SUBLIMAZE) injection 50 mcg (50 mcg Intravenous Given 08/06/23 2113)     IMPRESSION / MDM / ASSESSMENT AND PLAN / ED COURSE  I reviewed the triage vital signs and the nursing notes.                             73 year old female presents for evaluation after a fall.  Blood pressure is elevated and patient is bradycardic otherwise vital signs are stable.  Patient in significant pain on initial assessment.  Differential diagnosis includes, but is not limited to, shoulder dislocation, humeral fracture, compartment syndrome, closed head injury, neck fracture, laceration, vascular injury, nerve injury.  Patient's presentation is most consistent with acute presentation with potential threat to life or bodily function.  CT  head, cervical spine and maxillofacial was negative for acute abnormalities.  Laceration on the upper neck just below the right side of the chin repaired as described in the procedure note above.  Patient was advised on wound care.  Right humerus x-ray shows a fracture as interpreted in the radiology portion of the note.  Spoke with the on-call orthopedic doctor who recommended transfer of patient to the orthopedic trauma team at [facility] as patient will likely need ORIF.  Patient will be placed in a coaptation splint and a shoulder immobilizer.  I spoke with the on-call orthopedic  doctor at [facility], Dr. Eulah Pont, who did not feel that patient needed transfer tonight.  He will see her in the office later this week.  Patient was given fentanyl for pain while in the emergency department and a dose of Percocet before discharge.  I will send her with a prescription for the same.  Patient was agreeable to plan, voiced understanding and was stable at discharge.      FINAL CLINICAL IMPRESSION(S) / ED DIAGNOSES   Final diagnoses:  Closed displaced spiral fracture of shaft of right humerus, initial encounter  Laceration of neck, initial encounter     Rx / DC Orders   ED Discharge Orders          Ordered    oxyCODONE-acetaminophen (PERCOCET) 5-325 MG tablet  Every 4 hours PRN        08/06/23 2132             Note:  This document was prepared using Dragon voice recognition software and may include unintentional dictation errors.   Cameron Ali, PA-C 08/06/23 2142    Trinna Post, MD 08/06/23 [phone]

## 2023-08-07 ENCOUNTER — Encounter: Payer: Self-pay | Admitting: Internal Medicine

## 2023-08-08 ENCOUNTER — Encounter (HOSPITAL_COMMUNITY): Payer: Self-pay | Admitting: Student

## 2023-08-08 ENCOUNTER — Encounter (HOSPITAL_COMMUNITY): Payer: Self-pay

## 2023-08-08 ENCOUNTER — Inpatient Hospital Stay (HOSPITAL_COMMUNITY)
Admission: EM | Admit: 2023-08-08 | Discharge: 2023-08-15 | DRG: 493 | Disposition: A | Discharge status: Rehabilitation Facility | Source: Ambulatory Visit | Attending: Internal Medicine | Admitting: Internal Medicine

## 2023-08-08 ENCOUNTER — Other Ambulatory Visit: Payer: Self-pay

## 2023-08-08 ENCOUNTER — Emergency Department (HOSPITAL_COMMUNITY)
Admission: EM | Admit: 2023-08-08 | Disposition: A | Discharge status: Still In Hospital | Source: Home / Self Care | Attending: Emergency Medicine | Admitting: Emergency Medicine

## 2023-08-08 DIAGNOSIS — S42351A Displaced comminuted fracture of shaft of humerus, right arm, initial encounter for closed fracture: Secondary | ICD-10-CM | POA: Diagnosis not present

## 2023-08-08 DIAGNOSIS — I10 Essential (primary) hypertension: Secondary | ICD-10-CM | POA: Diagnosis present

## 2023-08-08 DIAGNOSIS — S88111S Complete traumatic amputation at level between knee and ankle, right lower leg, sequela: Secondary | ICD-10-CM | POA: Diagnosis not present

## 2023-08-08 DIAGNOSIS — F17210 Nicotine dependence, cigarettes, uncomplicated: Secondary | ICD-10-CM | POA: Diagnosis present

## 2023-08-08 DIAGNOSIS — F319 Bipolar disorder, unspecified: Secondary | ICD-10-CM | POA: Diagnosis present

## 2023-08-08 DIAGNOSIS — Y92009 Unspecified place in unspecified non-institutional (private) residence as the place of occurrence of the external cause: Secondary | ICD-10-CM

## 2023-08-08 DIAGNOSIS — Z9841 Cataract extraction status, right eye: Secondary | ICD-10-CM

## 2023-08-08 DIAGNOSIS — Z8673 Personal history of transient ischemic attack (TIA), and cerebral infarction without residual deficits: Secondary | ICD-10-CM

## 2023-08-08 DIAGNOSIS — Z9842 Cataract extraction status, left eye: Secondary | ICD-10-CM

## 2023-08-08 DIAGNOSIS — F419 Anxiety disorder, unspecified: Secondary | ICD-10-CM | POA: Diagnosis not present

## 2023-08-08 DIAGNOSIS — E782 Mixed hyperlipidemia: Secondary | ICD-10-CM | POA: Diagnosis not present

## 2023-08-08 DIAGNOSIS — Z751 Person awaiting admission to adequate facility elsewhere: Secondary | ICD-10-CM

## 2023-08-08 DIAGNOSIS — Z96653 Presence of artificial knee joint, bilateral: Secondary | ICD-10-CM | POA: Diagnosis present

## 2023-08-08 DIAGNOSIS — Z8541 Personal history of malignant neoplasm of cervix uteri: Secondary | ICD-10-CM | POA: Diagnosis not present

## 2023-08-08 DIAGNOSIS — Z96612 Presence of left artificial shoulder joint: Secondary | ICD-10-CM | POA: Diagnosis present

## 2023-08-08 DIAGNOSIS — Z961 Presence of intraocular lens: Secondary | ICD-10-CM | POA: Diagnosis present

## 2023-08-08 DIAGNOSIS — W19XXXA Unspecified fall, initial encounter: Secondary | ICD-10-CM

## 2023-08-08 DIAGNOSIS — E876 Hypokalemia: Secondary | ICD-10-CM | POA: Diagnosis present

## 2023-08-08 DIAGNOSIS — Z8261 Family history of arthritis: Secondary | ICD-10-CM

## 2023-08-08 DIAGNOSIS — E785 Hyperlipidemia, unspecified: Secondary | ICD-10-CM | POA: Diagnosis not present

## 2023-08-08 DIAGNOSIS — S42354A Nondisplaced comminuted fracture of shaft of humerus, right arm, initial encounter for closed fracture: Secondary | ICD-10-CM | POA: Diagnosis not present

## 2023-08-08 DIAGNOSIS — W050XXA Fall from non-moving wheelchair, initial encounter: Secondary | ICD-10-CM | POA: Diagnosis present

## 2023-08-08 DIAGNOSIS — Z96611 Presence of right artificial shoulder joint: Secondary | ICD-10-CM | POA: Diagnosis present

## 2023-08-08 DIAGNOSIS — Z66 Do not resuscitate: Secondary | ICD-10-CM | POA: Diagnosis present

## 2023-08-08 DIAGNOSIS — G9340 Encephalopathy, unspecified: Secondary | ICD-10-CM | POA: Diagnosis not present

## 2023-08-08 DIAGNOSIS — Z888 Allergy status to other drugs, medicaments and biological substances status: Secondary | ICD-10-CM

## 2023-08-08 DIAGNOSIS — R6889 Other general symptoms and signs: Secondary | ICD-10-CM | POA: Diagnosis not present

## 2023-08-08 DIAGNOSIS — Z885 Allergy status to narcotic agent status: Secondary | ICD-10-CM

## 2023-08-08 DIAGNOSIS — Z79899 Other long term (current) drug therapy: Secondary | ICD-10-CM | POA: Diagnosis not present

## 2023-08-08 DIAGNOSIS — S42301A Unspecified fracture of shaft of humerus, right arm, initial encounter for closed fracture: Principal | ICD-10-CM | POA: Diagnosis present

## 2023-08-08 DIAGNOSIS — R627 Adult failure to thrive: Secondary | ICD-10-CM | POA: Diagnosis not present

## 2023-08-08 DIAGNOSIS — Z83438 Family history of other disorder of lipoprotein metabolism and other lipidemia: Secondary | ICD-10-CM

## 2023-08-08 DIAGNOSIS — R001 Bradycardia, unspecified: Secondary | ICD-10-CM | POA: Diagnosis present

## 2023-08-08 DIAGNOSIS — F17200 Nicotine dependence, unspecified, uncomplicated: Secondary | ICD-10-CM | POA: Diagnosis present

## 2023-08-08 DIAGNOSIS — R609 Edema, unspecified: Secondary | ICD-10-CM | POA: Diagnosis not present

## 2023-08-08 DIAGNOSIS — Z993 Dependence on wheelchair: Secondary | ICD-10-CM

## 2023-08-08 DIAGNOSIS — F3130 Bipolar disorder, current episode depressed, mild or moderate severity, unspecified: Secondary | ICD-10-CM | POA: Diagnosis present

## 2023-08-08 DIAGNOSIS — Z89511 Acquired absence of right leg below knee: Secondary | ICD-10-CM | POA: Diagnosis not present

## 2023-08-08 DIAGNOSIS — S42301D Unspecified fracture of shaft of humerus, right arm, subsequent encounter for fracture with routine healing: Secondary | ICD-10-CM | POA: Diagnosis not present

## 2023-08-08 DIAGNOSIS — Z8249 Family history of ischemic heart disease and other diseases of the circulatory system: Secondary | ICD-10-CM

## 2023-08-08 DIAGNOSIS — S88111A Complete traumatic amputation at level between knee and ankle, right lower leg, initial encounter: Secondary | ICD-10-CM

## 2023-08-08 DIAGNOSIS — K219 Gastro-esophageal reflux disease without esophagitis: Secondary | ICD-10-CM | POA: Diagnosis present

## 2023-08-08 DIAGNOSIS — M810 Age-related osteoporosis without current pathological fracture: Secondary | ICD-10-CM | POA: Diagnosis present

## 2023-08-08 LAB — COMPREHENSIVE METABOLIC PANEL WITH GFR
ALT: 14 U/L (ref 0–44)
AST: 22 U/L (ref 15–41)
Albumin: 3.1 g/dL — ABNORMAL LOW (ref 3.5–5.0)
Alkaline Phosphatase: 64 U/L (ref 38–126)
Anion gap: 10 (ref 5–15)
BUN: 5 mg/dL — ABNORMAL LOW (ref 8–23)
CO2: 23 mmol/L (ref 22–32)
Calcium: 9.3 mg/dL (ref 8.9–10.3)
Chloride: 102 mmol/L (ref 98–111)
Creatinine, Ser: 0.84 mg/dL (ref 0.44–1.00)
GFR, Estimated: >60 mL/min (ref >60)
Glucose, Bld: 89 mg/dL (ref 70–99)
Potassium: 3.6 mmol/L (ref 3.5–5.1)
Sodium: 135 mmol/L (ref 135–145)
Total Bilirubin: 1 mg/dL (ref 0.0–1.2)
Total Protein: 6.5 g/dL (ref 6.5–8.1)

## 2023-08-08 LAB — CBC
HCT: 35.5 % — ABNORMAL LOW (ref 36.0–46.0)
Hemoglobin: 12 g/dL (ref 12.0–15.0)
MCH: 29.6 pg (ref 26.0–34.0)
MCHC: 33.8 g/dL (ref 30.0–36.0)
MCV: 87.4 fL (ref 80.0–100.0)
Platelets: 262 10*3/uL (ref 150–400)
RBC: 4.06 MIL/uL (ref 3.87–5.11)
RDW: 13.7 % (ref 11.5–15.5)
WBC: 5.1 10*3/uL (ref 4.0–10.5)
nRBC: 0 % (ref 0.0–0.2)

## 2023-08-08 LAB — BLOOD GAS, VENOUS
Acid-base deficit: 1.5 mmol/L (ref 0.0–2.0)
Bicarbonate: 24.3 mmol/L (ref 20.0–28.0)
O2 Saturation: 60.2 %
Patient temperature: 36.7
pCO2, Ven: 43 mmHg — ABNORMAL LOW (ref 44–60)
pH, Ven: 7.35 (ref 7.25–7.43)
pO2, Ven: 34 mmHg (ref 32–45)

## 2023-08-08 LAB — URINALYSIS, ROUTINE W REFLEX MICROSCOPIC
Bacteria, UA: NONE SEEN
Bilirubin Urine: NEGATIVE
Glucose, UA: NEGATIVE mg/dL
Ketones, ur: NEGATIVE mg/dL
Leukocytes,Ua: NEGATIVE
Nitrite: NEGATIVE
Protein, ur: NEGATIVE mg/dL
Specific Gravity, Urine: 1.003 — ABNORMAL LOW (ref 1.005–1.030)
pH: 7 (ref 5.0–8.0)

## 2023-08-08 LAB — CK: Total CK: 262 U/L — ABNORMAL HIGH (ref 38–234)

## 2023-08-08 LAB — RAPID URINE DRUG SCREEN, HOSP PERFORMED
Amphetamines: NOT DETECTED
Barbiturates: NOT DETECTED
Benzodiazepines: NOT DETECTED
Cocaine: NOT DETECTED
Opiates: NOT DETECTED
Tetrahydrocannabinol: NOT DETECTED

## 2023-08-08 LAB — TSH: TSH: 1.781 u[IU]/mL (ref 0.350–4.500)

## 2023-08-08 LAB — PHOSPHORUS: Phosphorus: 2.9 mg/dL (ref 2.5–4.6)

## 2023-08-08 LAB — HIV ANTIBODY (ROUTINE TESTING W REFLEX): HIV Screen 4th Generation wRfx: NONREACTIVE

## 2023-08-08 LAB — VITAMIN D 25 HYDROXY (VIT D DEFICIENCY, FRACTURES): Vit D, 25-Hydroxy: 44.01 ng/mL (ref 30–100)

## 2023-08-08 LAB — VITAMIN B12: Vitamin B-12: 1342 pg/mL — ABNORMAL HIGH (ref 180–914)

## 2023-08-08 LAB — MAGNESIUM: Magnesium: 1.9 mg/dL (ref 1.7–2.4)

## 2023-08-08 LAB — AMMONIA: Ammonia: 41 umol/L — ABNORMAL HIGH (ref 9–35)

## 2023-08-08 MED ORDER — ENOXAPARIN SODIUM 40 MG/0.4ML IJ SOSY
40.0000 mg | PREFILLED_SYRINGE | INTRAMUSCULAR | Status: AC
  Administered 2023-08-09 – 2023-08-11 (×3): 40 mg via SUBCUTANEOUS
  Filled 2023-08-08 (×3): qty 0.4

## 2023-08-08 MED ORDER — POLYETHYLENE GLYCOL 3350 17 G PO PACK: 17.0000 g | PACK | Freq: Two times a day (BID) | ORAL | Status: DC | PRN

## 2023-08-08 MED ORDER — ACETAMINOPHEN 500 MG PO TABS
1000.0000 mg | ORAL_TABLET | Freq: Four times a day (QID) | ORAL | Status: DC
  Administered 2023-08-08 – 2023-08-15 (×22): 1000 mg via ORAL
  Filled 2023-08-08 (×22): qty 2

## 2023-08-08 MED ORDER — ALBUTEROL SULFATE HFA 108 (90 BASE) MCG/ACT IN AERS: 2.0000 | INHALATION_SPRAY | Freq: Four times a day (QID) | RESPIRATORY_TRACT | Status: DC | PRN

## 2023-08-08 MED ORDER — LATANOPROST 0.005 % OP SOLN
1.0000 [drp] | Freq: Every day | OPHTHALMIC | Status: DC
  Administered 2023-08-08 – 2023-08-14 (×7): 1 [drp] via OPHTHALMIC
  Filled 2023-08-08: qty 2.5

## 2023-08-08 MED ORDER — OXYCODONE HCL 5 MG PO TABS
5.0000 mg | ORAL_TABLET | Freq: Three times a day (TID) | ORAL | Status: DC | PRN
  Administered 2023-08-09 – 2023-08-11 (×4): 5 mg via ORAL
  Filled 2023-08-08 (×4): qty 1

## 2023-08-08 MED ORDER — POLYVINYL ALCOHOL 1.4 % OP SOLN: 2.0000 [drp] | Freq: Every day | OPHTHALMIC | Status: DC | PRN

## 2023-08-08 MED ORDER — PANTOPRAZOLE SODIUM 40 MG PO TBEC
40.0000 mg | DELAYED_RELEASE_TABLET | Freq: Two times a day (BID) | ORAL | Status: DC
  Administered 2023-08-08 – 2023-08-15 (×14): 40 mg via ORAL
  Filled 2023-08-08 (×14): qty 1

## 2023-08-08 MED ORDER — GABAPENTIN 100 MG PO CAPS
200.0000 mg | ORAL_CAPSULE | Freq: Two times a day (BID) | ORAL | Status: DC
  Administered 2023-08-08 – 2023-08-15 (×14): 200 mg via ORAL
  Filled 2023-08-08 (×14): qty 2

## 2023-08-08 MED ORDER — DULOXETINE HCL 60 MG PO CPEP
60.0000 mg | ORAL_CAPSULE | Freq: Every day | ORAL | Status: DC
  Administered 2023-08-08 – 2023-08-15 (×8): 60 mg via ORAL
  Filled 2023-08-08 (×8): qty 1

## 2023-08-08 MED ORDER — LACTATED RINGERS IV SOLN: INTRAVENOUS | Status: AC

## 2023-08-08 MED ORDER — SENNOSIDES-DOCUSATE SODIUM 8.6-50 MG PO TABS: 1.0000 | ORAL_TABLET | Freq: Two times a day (BID) | ORAL | Status: DC | PRN

## 2023-08-08 MED ORDER — AMLODIPINE BESYLATE 5 MG PO TABS
5.0000 mg | ORAL_TABLET | Freq: Every day | ORAL | Status: DC
  Administered 2023-08-08 – 2023-08-09 (×2): 5 mg via ORAL
  Filled 2023-08-08 (×2): qty 1

## 2023-08-08 MED ORDER — LAMOTRIGINE 100 MG PO TABS
200.0000 mg | ORAL_TABLET | Freq: Every day | ORAL | Status: DC
  Administered 2023-08-08 – 2023-08-15 (×8): 200 mg via ORAL
  Filled 2023-08-08 (×8): qty 2

## 2023-08-08 MED ORDER — FENTANYL CITRATE PF 50 MCG/ML IJ SOSY: 12.5000 ug | PREFILLED_SYRINGE | INTRAMUSCULAR | Status: DC | PRN

## 2023-08-08 NOTE — ED Notes (Signed)
   08/08/23 1223  Vitals  BP (!) 163/74  MAP (mmHg) 94  BP Location Left Arm  BP Method Automatic  Patient Position (if appropriate) Lying  Pulse Rate (!) 57  Pulse Rate Source Monitor  Resp 18  Oxygen Therapy  SpO2 94 %  MEWS Score  MEWS Temp 0  MEWS Systolic 0  MEWS Pulse 0  MEWS RR 0  MEWS LOC 0  MEWS Score 0  MEWS Score Color Green     Patient arrived to the unit, VSS, primary RN informed.

## 2023-08-08 NOTE — Plan of Care (Signed)

## 2023-08-08 NOTE — H&P (Signed)
 History and Physical    Sydney Evans ZOX:[id number] DOB: [birth year] DOA: 08/08/2023  PCP: Excell Seltzer, MD Patient coming from: Home.  Lives alone.  Uses wheelchair at baseline.  Chief Complaint: Confusion and incoherence  HPI: Sydney Evans is a 73 year old F with PMH of CVA, right BKA, HTN, anxiety, bipolar disorder, tobacco use disorder, hyperlipidemia, right shoulder replacement and GERD sent to ED from orthopedics office due to incoherence and inability to care for herself.  Patient fell off wheelchair and seen in ED on 4/2.  X-ray showed acute closed comminuted oblique/spiral midshaft fracture of the right humerus with a sizable butterfly fragment between the fracture margins and mild to moderate posterior lateral angulation of the main distal fragment, as well as apex medial angulation of the main proximal fracture component.  She also had some laceration to her right neck that was sutured.  Per EDP note, x-ray finding discussed with orthopedic surgery, Dr. Eulah Pont who recommended outpatient follow-up later in the week.  Patient went to see Dr. Eulah Pont this morning, and she was sent to ED due to inability to care for herself and "failure to thrive".  Plan for ORIF on Tuesday.  On my evaluation, patient is sleepy but wakes to voice.  She is not alert.  She is oriented to self, place, month and year but not date.  Not able to tell me much about why she is in the hospital.  She reports 10/10 pain in her right arm although she does not appear to be in that much distress.  She responds no to chest pain, shortness of breath, abdominal pain or problem with urination.  She follows commands.  She has no focal neurodeficit although limited in right upper extremity due to sling.  Patient reports smoking about half a pack a day.  Denies drinking alcohol or recreational drug use.  Talking to patient's daughter, Sydney Evans over the phone, patient was very incoherent and was not eating or drinking.  Her urine  looked "terrible and I almost puked". Per Sydney Evans, patient is DNR with limited intervention.  On arrival, HR 57.  BP 163/74.  94% on room air.  Sleepy but wakes to voice.  Not alert.  Oriented to self, place, month or year.  Follows commands.  No focal neurodeficit.  No apparent distress.  Labs ordered.   ROS All review of system negative except for pertinent positives and negatives as history of present illness above.  PMH 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   Anemia    Anxiety    Arthritis    Barrett's esophagus    Bipolar disorder (HCC)    Blocked artery    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   Blood clot in vein    Cancer (HCC)    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   Carotid arterial disease (HCC)    Contracture of joint of upper arm    Critical limb ischemia of right lower extremity (HCC) 06/21/2021   Depression    Dislocation of right shoulder joint 10/06/2019   Disorder of bone and articular cartilage 09/29/2014   Elevated lipids    GERD (gastroesophageal reflux disease)    Hypertension    Migraine    Osteoporosis    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   Poor balance    Rotator cuff tendinitis, left 05/20/2016   Sinus congestion    Status post total shoulder arthroplasty, 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   Status post total shoulder replacement, 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  Stroke [facility])    x 2   TIA (transient ischemic attack)    Traumatic complete tear of right rotator cuff 10/08/2019   Vertigo    PSH Past Surgical History:  Procedure Laterality Date   ABDOMINAL HYSTERECTOMY     pt states might still have ovaries   BIOPSY  01/31/2023   Procedure: BIOPSY;  Surgeon: Regis Bill, MD;  Location: [facility] ENDOSCOPY;  Service: Endoscopy;;   bka right     BREAST BIOPSY Left    CARPAL TUNNEL RELEASE Bilateral    CATARACT EXTRACTION W/ INTRAOCULAR LENS  IMPLANT, BILATERAL Bilateral    CHOLECYSTECTOMY     COLONOSCOPY N/A 01/31/2023   Procedure: COLONOSCOPY;  Surgeon: Regis Bill, MD;  Location: [facility] ENDOSCOPY;  Service: Endoscopy;  Laterality: N/A;   COLONOSCOPY WITH PROPOFOL N/A 04/09/2018   Procedure: COLONOSCOPY WITH PROPOFOL;  Surgeon: Wyline Mood, MD;  Location: [facility] ENDOSCOPY;  Service: Gastroenterology;  Laterality: N/A;   crystal cyst removed on left foot     ESOPHAGOGASTRODUODENOSCOPY (EGD) WITH PROPOFOL N/A 04/09/2018   Procedure: ESOPHAGOGASTRODUODENOSCOPY (EGD) WITH PROPOFOL;  Surgeon: Wyline Mood, MD;  Location: [facility] ENDOSCOPY;  Service: Gastroenterology;  Laterality: N/A;   ESOPHAGOGASTRODUODENOSCOPY (EGD) WITH PROPOFOL N/A 12/14/2019   Procedure: ESOPHAGOGASTRODUODENOSCOPY (EGD) WITH PROPOFOL;  Surgeon: Regis Bill, MD;  Location: [facility] ENDOSCOPY;  Service: Endoscopy;  Laterality: N/A;   ESOPHAGOGASTRODUODENOSCOPY (EGD) WITH PROPOFOL N/A 01/31/2023   Procedure: ESOPHAGOGASTRODUODENOSCOPY (EGD) WITH PROPOFOL;  Surgeon: Regis Bill, MD;  Location: [facility] ENDOSCOPY;  Service: Endoscopy;  Laterality: N/A;   EYE SURGERY     femoral fx     FOOT SURGERY     GIVENS CAPSULE STUDY N/A 06/16/2018   Procedure: GIVENS CAPSULE STUDY;  Surgeon: Wyline Mood, MD;  Location: [facility] ENDOSCOPY;  Service: Gastroenterology;  Laterality: N/A;   JOINT REPLACEMENT     KNEE SURGERY     KYPHOPLASTY     LOWER EXTREMITY ANGIOGRAPHY Right 06/21/2021   Procedure: Lower Extremity Angiography;  Surgeon: Renford Dills, MD;  Location: [facility] INVASIVE CV LAB;  Service: Cardiovascular;  Laterality: Right;   ORIF PERIPROSTHETIC FRACTURE Right 10/07/2019   Procedure: OPEN REDUCTION INTERNAL FIXATION (ORIF) PERIPROSTHETIC FRACTURE;  Surgeon: Christena Flake, MD;  Location: [facility] ORS;  Service: Orthopedics;  Laterality: Right;   POLYPECTOMY  01/31/2023   Procedure: POLYPECTOMY;  Surgeon: Regis Bill, MD;  Location: [facility] ENDOSCOPY;  Service: Endoscopy;;   TONSILLECTOMY     TOTAL KNEE ARTHROPLASTY Bilateral    TOTAL SHOULDER ARTHROPLASTY Left 08/01/2016    Procedure: TOTAL SHOULDER ARTHROPLASTY;  Surgeon: Christena Flake, MD;  Location: [facility] ORS;  Service: Orthopedics;  Laterality: Left;   TOTAL SHOULDER ARTHROPLASTY Right 09/30/2019   Procedure: TOTAL SHOULDER ARTHROPLASTY;  Surgeon: Christena Flake, MD;  Location: [facility] ORS;  Service: Orthopedics;  Laterality: Right;   Fam HX Family History  Problem Relation Age of Onset   Other Mother        ?lupus    Cancer Mother    High Cholesterol Mother    Alcohol abuse Father    CAD Father        CABG   High Cholesterol Father    Arthritis Father    Heart murmur Sister    Bradycardia Sister    Heart murmur Brother    High blood pressure Other        "for everybody"   High Cholesterol Other        "for everybody"  Social Hx  reports that she has been smoking cigarettes. She has never used smokeless tobacco. She reports that she does not drink alcohol and does not use drugs.  Allergy Allergies  Allergen Reactions   Hydroxyzine Hives and Rash   Bactrim [Sulfamethoxazole-Trimethoprim] Nausea And Vomiting   Dilaudid [Hydromorphone] Itching   Ketorolac Rash     May take with benadryl   Toradol [Ketorolac Tromethamine] Rash    May take with benadryl   Tramadol Hives   Zofran [Ondansetron] Other (See Comments)    Broke mouth out   Home Meds Prior to Admission medications   Medication Sig Start Date End Date Taking? Authorizing Provider  acetaminophen (TYLENOL) 500 MG tablet Take 500 mg by mouth every 6 (six) hours as needed.    [provider]  albuterol (VENTOLIN HFA) 108 (90 Base) MCG/ACT inhaler INHALE 2 PUFFS BY MOUTH EVERY 4 HOURS AS NEEDED FOR SHORTNESS OF BREATH 07/11/23   Bedsole, Amy E, MD  amLODipine (NORVASC) 5 MG tablet Take 1 tablet (5 mg total) by mouth daily. 11/15/22 11/15/23  Mort Sawyers, FNP  atorvastatin (LIPITOR) 40 MG tablet Take 0.5 tablets (20 mg total) by mouth daily. 04/11/23   Bedsole, Amy E, MD  carboxymethylcellulose (LUBRICANT EYE DROPS) 0.5 % SOLN Place  2 drops into both eyes daily as needed (Dry eye). Systane 04/17/23   Bedsole, Amy E, MD  DULoxetine (CYMBALTA) 60 MG capsule Take 1 capsule (60 mg total) by mouth daily. 07/29/23   Bedsole, Amy E, MD  fluticasone (FLONASE) 50 MCG/ACT nasal spray Place 1 spray into both nostrils daily as needed for allergies or rhinitis. 04/24/23   Bedsole, Amy E, MD  gabapentin (NEURONTIN) 300 MG capsule Take two capsules TID and 2  capsule QHS 04/25/23   Bedsole, Amy E, MD  lamoTRIgine (LAMICTAL) 200 MG tablet Take 1 tablet (200 mg total) by mouth daily. 04/24/23 07/23/23  Neysa Hotter, MD  latanoprost (XALATAN) 0.005 % ophthalmic solution Place 1 drop into both eyes at bedtime. 04/25/23   Bedsole, Amy E, MD  methocarbamol (ROBAXIN) 500 MG tablet Take 1 tablet (500 mg total) by mouth 2 (two) times daily. 04/17/23   Excell Seltzer, MD  oxyCODONE-acetaminophen (PERCOCET) 5-325 MG tablet Take 1 tablet by mouth every 4 (four) hours as needed for severe pain (pain score 7-10). 08/06/23 08/05/24  Cameron Ali, PA-C  pantoprazole (PROTONIX) 40 MG tablet Take 1 tablet by mouth twice daily 05/14/23   Bedsole, Amy E, MD  propranolol (INDERAL) 20 MG tablet Take 1 tablet (20 mg total) by mouth daily as needed. 09/11/22   Mort Sawyers, FNP  valACYclovir (VALTREX) 500 MG tablet Take 1 tablet (500 mg total) by mouth daily. 10/01/22   Mort Sawyers, FNP    Physical Exam: Vitals:   08/08/23 1400  BP: (!) 163/74  Pulse: (!) 57  Resp: 18  Temp: 98 F (36.7 C)  TempSrc: Oral  SpO2: 94%  Weight: 67 kg  Height: 5\' 1"  (1.549 m)    GENERAL: No acute distress.  Appears well.  HEENT: MMM.  Vision and hearing grossly intact.  NECK: Supple.  No apparent JVD.  RESP:  No IWOB. Good air movement bilaterally. CVS:  RRR. Heart sounds normal.  ABD/GI/GU: Bowel sounds present. Soft. Non tender.  MSK/EXT:  Moves extremities.  RLE swelling.  Neurovascular intact. SKIN: no apparent skin lesion or wound NEURO: Sleepy but wakes to  voice.  Not alert.  Oriented to self, place, month and year.  Follows commands.  No focal neurodeficit but limited in RUE due to splint and sling. PSYCH: Calm.  No distress or agitation.  Personally Reviewed Radiological Exams See HPI   Personally Reviewed Labs: CBC: No results for input(s): "WBC", "NEUTROABS", "HGB", "HCT", "MCV", "PLT" in the last 168 hours. Basic Metabolic Panel: No results for input(s): "NA", "K", "CL", "CO2", "GLUCOSE", "BUN", "CREATININE", "CALCIUM", "MG", "PHOS" in the last 168 hours. GFR: CrCl cannot be calculated (Patient's most recent lab result is older than the maximum 21 days allowed.). Liver Function Tests: No results for input(s): "AST", "ALT", "ALKPHOS", "BILITOT", "PROT", "ALBUMIN" in the last 168 hours. No results for input(s): "LIPASE", "AMYLASE" in the last 168 hours. No results for input(s): "AMMONIA" in the last 168 hours. Coagulation Profile: No results for input(s): "INR", "PROTIME" in the last 168 hours. Cardiac Enzymes: No results for input(s): "CKTOTAL", "CKMB", "CKMBINDEX", "TROPONINI" in the last 168 hours. BNP (last 3 results) No results for input(s): "PROBNP" in the last 8760 hours. HbA1C: No results for input(s): "HGBA1C" in the last 72 hours. CBG: No results for input(s): "GLUCAP" in the last 168 hours. Lipid Profile: No results for input(s): "CHOL", "HDL", "LDLCALC", "TRIG", "CHOLHDL", "LDLDIRECT" in the last 72 hours. Thyroid Function Tests: No results for input(s): "TSH", "T4TOTAL", "FREET4", "T3FREE", "THYROIDAB" in the last 72 hours. Anemia Panel: No results for input(s): "VITAMINB12", "FOLATE", "FERRITIN", "TIBC", "IRON", "RETICCTPCT" in the last 72 hours. Urine analysis:    Component Value Date/Time   COLORURINE YELLOW (A) 12/12/2022 2111   APPEARANCEUR HAZY (A) 12/12/2022 2111   APPEARANCEUR Clear 12/15/2019 1017   LABSPEC 1.019 12/12/2022 2111   PHURINE 6.0 12/12/2022 2111   GLUCOSEU NEGATIVE 12/12/2022 2111   HGBUR  NEGATIVE 12/12/2022 2111   BILIRUBINUR NEGATIVE 12/12/2022 2111   BILIRUBINUR Negative 08/08/2022 1633   BILIRUBINUR Negative 12/15/2019 1017   KETONESUR NEGATIVE 12/12/2022 2111   PROTEINUR NEGATIVE 12/12/2022 2111   UROBILINOGEN 0.2 08/08/2022 1633   NITRITE NEGATIVE 12/12/2022 2111   LEUKOCYTESUR SMALL (A) 12/12/2022 2111     Assessment and plan:  Principal Problem:   Right arm fracture Active Problems:   GERD (gastroesophageal reflux disease)   Essential hypertension   Hyperlipidemia   Bipolar depression (HCC)   Anxiety   History of cervical cancer   Chronic sinus bradycardia   Tobacco use disorder   Fall at home, initial encounter   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 Right lower lip and right neck skin laceration-right neck skin laceration repaired in ED 1/2. Right femoral fracture-x-ray finding as above. -Continue splint and sling.  Plan for ORIF by Dr. Eulah Pont on 08/12/2023 -Continue sling.  NWB on R RUE. -Pain control with scheduled Tylenol, as needed oxycodone and fentanyl -Lovenox for DVT prophylaxis -PT/OT eval  Acute encephalopathy: Could be iatrogenic from pain meds, delirium and UTI.  Per daughter, no history of dementia.  Lives alone independently.  Recent head, cervical and maxillofacial CT without significant finding.  She is awake but not alert.  Oriented to self, place, month and year but not date.  Follows commands.  No focal neurodeficit on exam.  No cardiopulmonary or GI symptoms. -Check CBC, CMP, ammonia, UDS, VBG, urinalysis, TSH, B12 and RPR -Minimize avoid sedating medications -Reorientation and delirium precaution -Will order IV fluid hydration based on CMP.  Essential hypertension: BP slightly elevated - Will  resume home medications after med rec  Anxiety and bipolar disorder - Continue home  meds after med rec  History of right BKA: Wheelchair dependent. - PT/OT eval  History of CVA/hyperlipidemia - Continue home meds after med rec  Tobacco use disorder: Reports smoking about half a pack a day. - Encouraged smoking cessation.  Not interested in nicotine patch  Medication management - Requested pharmacy to review home meds as soon as possible   DVT prophylaxis: Lovenox after CBC and CMP  Code Status: DNR-Limited.  Discussed with patient's daughter, Sydney Evans. Her other daughter, Tresa Endo didn't answer call Family Communication: Updated patient's daughter, Sydney Evans over the phone  Consults called: Sent here by orthopedic surgery, Dr. Marcheta Grammes he Admission status: Severity of Illness: The appropriate patient status for this patient is INPATIENT. Inpatient status is judged to be reasonable and necessary in order to provide the required intensity of service to ensure the patient's safety. The patient's presenting symptoms, physical exam findings, and initial radiographic and laboratory data in the context of their chronic comorbidities is felt to place them at high risk for further clinical deterioration. Furthermore, it is not anticipated that the patient will be medically stable for discharge from the hospital within 2 midnights of admission.       Almon Hercules MD [facility]  If 7PM-7AM, please contact night-coverage [email or web address]  08/08/2023, 3:25 PM

## 2023-08-09 ENCOUNTER — Other Ambulatory Visit: Payer: Self-pay | Admitting: Family Medicine

## 2023-08-09 DIAGNOSIS — I10 Essential (primary) hypertension: Secondary | ICD-10-CM | POA: Diagnosis not present

## 2023-08-09 DIAGNOSIS — K219 Gastro-esophageal reflux disease without esophagitis: Secondary | ICD-10-CM

## 2023-08-09 DIAGNOSIS — S42301D Unspecified fracture of shaft of humerus, right arm, subsequent encounter for fracture with routine healing: Secondary | ICD-10-CM | POA: Diagnosis not present

## 2023-08-09 DIAGNOSIS — S88111S Complete traumatic amputation at level between knee and ankle, right lower leg, sequela: Secondary | ICD-10-CM | POA: Diagnosis not present

## 2023-08-09 DIAGNOSIS — G9340 Encephalopathy, unspecified: Secondary | ICD-10-CM | POA: Diagnosis not present

## 2023-08-09 LAB — RENAL FUNCTION PANEL
Albumin: 2.6 g/dL — ABNORMAL LOW (ref 3.5–5.0)
Anion gap: 12 (ref 5–15)
BUN: <5 mg/dL — ABNORMAL LOW (ref 8–23)
CO2: 20 mmol/L — ABNORMAL LOW (ref 22–32)
Calcium: 8.6 mg/dL — ABNORMAL LOW (ref 8.9–10.3)
Chloride: 105 mmol/L (ref 98–111)
Creatinine, Ser: 0.78 mg/dL (ref 0.44–1.00)
GFR, Estimated: >60 mL/min (ref >60)
Glucose, Bld: 80 mg/dL (ref 70–99)
Phosphorus: 2.9 mg/dL (ref 2.5–4.6)
Potassium: 3.2 mmol/L — ABNORMAL LOW (ref 3.5–5.1)
Sodium: 137 mmol/L (ref 135–145)

## 2023-08-09 LAB — CBC
HCT: 34.4 % — ABNORMAL LOW (ref 36.0–46.0)
Hemoglobin: 11.3 g/dL — ABNORMAL LOW (ref 12.0–15.0)
MCH: 29 pg (ref 26.0–34.0)
MCHC: 32.8 g/dL (ref 30.0–36.0)
MCV: 88.4 fL (ref 80.0–100.0)
Platelets: 250 10*3/uL (ref 150–400)
RBC: 3.89 MIL/uL (ref 3.87–5.11)
RDW: 14.2 % (ref 11.5–15.5)
WBC: 4.5 10*3/uL (ref 4.0–10.5)
nRBC: 0 % (ref 0.0–0.2)

## 2023-08-09 LAB — MAGNESIUM: Magnesium: 1.8 mg/dL (ref 1.7–2.4)

## 2023-08-09 LAB — RPR: RPR Ser Ql: NONREACTIVE

## 2023-08-09 MED ORDER — AMLODIPINE BESYLATE 10 MG PO TABS
10.0000 mg | ORAL_TABLET | Freq: Every day | ORAL | Status: DC
Start: 2023-08-10 — End: 2023-08-15
  Administered 2023-08-10 – 2023-08-15 (×6): 10 mg via ORAL
  Filled 2023-08-09 (×7): qty 1

## 2023-08-09 MED ORDER — ORAL CARE MOUTH RINSE: 15.0000 mL | OROMUCOSAL | Status: DC | PRN

## 2023-08-09 MED ORDER — LACTULOSE 10 GM/15ML PO SOLN: 20.0000 g | Freq: Two times a day (BID) | ORAL | Status: DC | PRN

## 2023-08-09 MED ORDER — POTASSIUM CHLORIDE CRYS ER 20 MEQ PO TBCR
40.0000 meq | EXTENDED_RELEASE_TABLET | ORAL | Status: AC
  Administered 2023-08-09 (×2): 40 meq via ORAL
  Filled 2023-08-09 (×2): qty 2

## 2023-08-09 MED ORDER — FENTANYL CITRATE PF 50 MCG/ML IJ SOSY
25.0000 ug | PREFILLED_SYRINGE | INTRAMUSCULAR | Status: DC | PRN
  Administered 2023-08-12: 25 ug via INTRAVENOUS
  Filled 2023-08-09 (×2): qty 1

## 2023-08-09 NOTE — Progress Notes (Signed)
 Physical Therapy Treatment Patient Details Name: Sydney Evans MRN: [id number] DOB: 1950-07-25 Today's Date: 08/09/2023   History of Present Illness 73 y.o. female presenting 4/2 after fall from wheelchair. Found to have comminuted oblique/spiral midshaft fx of the R humerus. Plan for ORIF Tuesday. PMH significant for CVA, right BKA, HTN, anxiety, bipolar disorder, tobacco use disorder, hyperlipidemia, right shoulder replacement and GERD.    PT Comments  Pt requesting assist back to bed. Seen again today to assess safety with DME utilizing Stedy for transfers. This went exceptionally well, requiring only min assist from therapist for boost/balance on Rt side to guard due to BKA and NWB through RUE. Shows good balance and awareness bearing weight through LLE and stabilizing with LUE only. Encourage nursing staff to use Stedy for OOB transfers 3x/day as tolerated. Needs cues not to reach with RUE. RUE left elevated in bed. Patient will continue to benefit from skilled physical therapy services to further improve independence with functional mobility.     If plan is discharge home, recommend the following: Two people to help with walking and/or transfers;Two people to help with bathing/dressing/bathroom;Assistance with cooking/housework;Assist for transportation   Can travel by private vehicle     No  Equipment Recommendations   (TBD next venue)    Recommendations for Other Services       Precautions / Restrictions Precautions Precautions: Fall Recall of Precautions/Restrictions: Impaired Required Braces or Orthoses: Sling (RUE) Restrictions Weight Bearing Restrictions Per Provider Order: Yes RUE Weight Bearing Per Provider Order: Non weight bearing Other Position/Activity Restrictions: Rt shoulder sling     Mobility  Bed Mobility Overal bed mobility: Needs Assistance Bed Mobility: Sit to Supine     Supine to sit: Min assist, HOB elevated Sit to supine: Min assist   General  bed mobility comments: Min assist for trunk and LE guidance into bed to avoid lying on Rt shoulder. Cues for technique.    Transfers Overall transfer level: Needs assistance Equipment used: Ambulation equipment used Transfers: Sit to/from Stand, Bed to chair/wheelchair/BSC Sit to Stand: Min assist, Via lift equipment Stand pivot transfers: Max assist, +2 physical assistance, +2 safety/equipment         General transfer comment: Min assist for boost to stand from recliner using Stedy. Demonstrates surprising ability to balance with LUE and LLE only, min assist blocking Rt side, placement of Rt knee to [employer] pad for safety. Able to rise a second time from paddles with min assist for balance.    Ambulation/Gait                   Stairs             Wheelchair Mobility     Tilt Bed    Modified Rankin (Stroke Patients Only)       Balance Overall balance assessment: Needs assistance Sitting-balance support: No upper extremity supported, Feet supported Sitting balance-Leahy Scale: Fair     Standing balance support: Single extremity supported Standing balance-Leahy Scale: Poor Standing balance comment: CGA with stedy                            Communication Communication Communication: Impaired Factors Affecting Communication: Hearing impaired  Cognition Arousal: Alert Behavior During Therapy: WFL for tasks assessed/performed   PT - Cognitive impairments: No apparent impairments                       PT - Cognition  Comments: Oriented x4 Following commands: Intact      Cueing Cueing Techniques: Verbal cues  Exercises General Exercises - Lower Extremity Ankle Circles/Pumps: AROM, Left, 10 reps, Supine Quad Sets: Strengthening, Both, 10 reps, Supine Gluteal Sets: Strengthening, Both, 10 reps, Supine    General Comments General comments (skin integrity, edema, etc.): Some stool on purewick - removed, RN Notified for change and  peri-care needs.      Pertinent Vitals/Pain Pain Assessment Pain Assessment: 0-10 Pain Score: 5  Pain Location: RUE Pain Descriptors / Indicators: Aching Pain Intervention(s): Monitored during session, Repositioned, Limited activity within patient's tolerance (RUE elevated)    Home Living Family/patient expects to be discharged to:: Private residence Living Arrangements: Alone Available Help at Discharge: Personal care attendant Type of Home: House Home Access: Level entry       Home Layout: One level Home Equipment: Agricultural consultant (2 wheels);BSC/3in1;Toilet riser;Tub bench      Prior Function            PT Goals (current goals can now be found in the care plan section) Acute Rehab PT Goals Patient Stated Goal: Get well PT Goal Formulation: With patient Time For Goal Achievement: 08/23/23 Potential to Achieve Goals: Good Progress towards PT goals: Progressing toward goals    Frequency    Min 2X/week      PT Plan      Co-evaluation PT/OT/SLP Co-Evaluation/Treatment: Yes Reason for Co-Treatment: Complexity of the patient's impairments (multi-system involvement);For patient/therapist safety;To address functional/ADL transfers PT goals addressed during session: Mobility/safety with mobility;Balance;Proper use of DME OT goals addressed during session: ADL's and self-care      AM-PAC PT "6 Clicks" Mobility   Outcome Measure  Help needed turning from your back to your side while in a flat bed without using bedrails?: A Little Help needed moving from lying on your back to sitting on the side of a flat bed without using bedrails?: A Little Help needed moving to and from a bed to a chair (including a wheelchair)?: Total Help needed standing up from a chair using your arms (e.g., wheelchair or bedside chair)?: A Lot Help needed to walk in hospital room?: Total Help needed climbing 3-5 steps with a railing? : Total 6 Click Score: 11    End of Session Equipment  Utilized During Treatment: Gait belt Activity Tolerance: Patient tolerated treatment well Patient left: with call bell/phone within reach;in bed;with bed alarm set;with family/visitor present Nurse Communication: Mobility status;Need for lift equipment (Purewick soiled and removed; Use Stedy for transfers) PT Visit Diagnosis: Unsteadiness on feet (R26.81);Pain;Difficulty in walking, not elsewhere classified (R26.2);History of falling (Z91.81);Muscle weakness (generalized) (M62.81) Pain - Right/Left: Right Pain - part of body: Arm     Time: 1610-9604 PT Time Calculation (min) (ACUTE ONLY): 15 min  Charges:    $Therapeutic Activity: 8-22 mins PT General Charges $$ ACUTE PT VISIT: 1 Visit                     Kathlyn Sacramento, PT, DPT [facility]  Rehabilitation Services Physical Therapist Office: [phone] Website: [email or web address]    Berton Mount 08/09/2023, 4:30 PM

## 2023-08-09 NOTE — Evaluation (Signed)
 Physical Therapy Evaluation Patient Details Name: Sydney Evans MRN: [id number] DOB: 11/19/1950 Today's Date: 08/09/2023  History of Present Illness  73 y.o. female presenting 4/2 after fall from wheelchair. Found to have comminuted oblique/spiral midshaft fx of the R humerus. Plan for ORIF Tuesday. PMH significant for CVA, right BKA, HTN, anxiety, bipolar disorder, tobacco use disorder, hyperlipidemia, right shoulder replacement and GERD.   Clinical Impression  Pt admitted with above diagnosis. She was mostly independent at home with an aide that assisted with IADLs and bathing but family reports concerns about aide not coming out as scheduled. Pt currently requires min assist for bed mobility. Up to max assist +2 for balance with stand pivot transfer from bed to recliner. LUE and LLE demonstrate fair enough strength to stand but due to RUE injury and BKA that cannot donne prosthesis right now, she needs quite a bit of assist to transfer. Pt currently with functional limitations due to the deficits listed below (see PT Problem List). Pt will benefit from acute skilled PT to increase their independence and safety with mobility to allow discharge.           If plan is discharge home, recommend the following: Two people to help with walking and/or transfers;Two people to help with bathing/dressing/bathroom;Assistance with cooking/housework;Assist for transportation   Can travel by private vehicle   No    Equipment Recommendations  (TBD next venue)  Recommendations for Other Services       Functional Status Assessment Patient has had a recent decline in their functional status and demonstrates the ability to make significant improvements in function in a reasonable and predictable amount of time.     Precautions / Restrictions Precautions Precautions: Fall Recall of Precautions/Restrictions: Impaired Required Braces or Orthoses: Sling (RUE) Restrictions Weight Bearing Restrictions Per  Provider Order: Yes RUE Weight Bearing Per Provider Order: Non weight bearing Other Position/Activity Restrictions: Rt shoulder sling      Mobility  Bed Mobility Overal bed mobility: Needs Assistance Bed Mobility: Supine to Sit     Supine to sit: Min assist, HOB elevated     General bed mobility comments: Min assist to rise, good trunk flexion without assist to long sit but needs help to turn and scoot to EOB.    Transfers Overall transfer level: Needs assistance Equipment used: 2 person hand held assist Transfers: Sit to/from Stand, Bed to chair/wheelchair/BSC Sit to Stand: Mod assist, +2 safety/equipment, +2 physical assistance, From elevated surface Stand pivot transfers: Max assist, +2 physical assistance, +2 safety/equipment         General transfer comment: Mod assist +2 for boost to stand, therapists on either side of pt to stabilize, LUE supported, assisted with weight shift towards left to offset imbalance without use of RUE and no prosthetic available for RLE. Max assist to pivot on LLE, does not buckle but needs significant help for balance and guide buttocks into chair gently.  Cues for techniques throughout.    Ambulation/Gait                  Stairs            Wheelchair Mobility     Tilt Bed    Modified Rankin (Stroke Patients Only)       Balance Overall balance assessment: Needs assistance Sitting-balance support: No upper extremity supported, Feet supported Sitting balance-Leahy Scale: Fair     Standing balance support: Single extremity supported Standing balance-Leahy Scale: Zero Standing balance comment: +2 assist for balance but  can bear weight through LLE well                             Pertinent Vitals/Pain Pain Assessment Pain Assessment: Faces Pain Score: 7  Pain Location: RUE Pain Descriptors / Indicators: Aching Pain Intervention(s): Monitored during session, Repositioned    Home Living Family/patient  expects to be discharged to:: Private residence Living Arrangements: Alone Available Help at Discharge: Personal care attendant Type of Home: House Home Access: Level entry       Home Layout: One level Home Equipment: Agricultural consultant (2 wheels);BSC/3in1;Toilet riser;Tub bench      Prior Function Prior Level of Function : Needs assist;History of Falls (last six months)             Mobility Comments: Family reports pt fell recnetly and is not using prosthesis lately while residual limb heal. Was using RW. Now mostly in w/c. ADLs Comments: Family reports pt cares for herself during the day. Has an aide that (questionably) come to assist for 3hr/day 7x/week but feel she was not receiving that much care.     Extremity/Trunk Assessment   Upper Extremity Assessment Upper Extremity Assessment: Defer to OT evaluation    Lower Extremity Assessment Lower Extremity Assessment: RLE deficits/detail RLE Deficits / Details: Hx Rt BKA - states she cannot wear prosthesis right now due to fall and injuring her Rt residual limb       Communication   Communication Communication: Impaired Factors Affecting Communication: Hearing impaired    Cognition Arousal: Alert Behavior During Therapy: WFL for tasks assessed/performed   PT - Cognitive impairments: No apparent impairments                       PT - Cognition Comments: Oriented x4 Following commands: Intact       Cueing Cueing Techniques: Verbal cues, Tactile cues     General Comments      Exercises     Assessment/Plan    PT Assessment Patient needs continued PT services  PT Problem List Decreased strength;Decreased range of motion;Decreased activity tolerance;Decreased balance;Decreased mobility;Decreased cognition;Decreased knowledge of use of DME;Decreased knowledge of precautions;Decreased safety awareness;Pain       PT Treatment Interventions DME instruction;Gait training;Functional mobility  training;Therapeutic activities;Therapeutic exercise;Balance training;Neuromuscular re-education;Cognitive remediation;Patient/family education;Wheelchair mobility training;Modalities    PT Goals (Current goals can be found in the Care Plan section)  Acute Rehab PT Goals Patient Stated Goal: Get well PT Goal Formulation: With patient Time For Goal Achievement: 08/23/23 Potential to Achieve Goals: Good    Frequency Min 2X/week     Co-evaluation PT/OT/SLP Co-Evaluation/Treatment: Yes Reason for Co-Treatment: Complexity of the patient's impairments (multi-system involvement);For patient/therapist safety;To address functional/ADL transfers PT goals addressed during session: Mobility/safety with mobility;Balance;Proper use of DME         AM-PAC PT "6 Clicks" Mobility  Outcome Measure Help needed turning from your back to your side while in a flat bed without using bedrails?: A Little Help needed moving from lying on your back to sitting on the side of a flat bed without using bedrails?: A Little Help needed moving to and from a bed to a chair (including a wheelchair)?: Total Help needed standing up from a chair using your arms (e.g., wheelchair or bedside chair)?: Total Help needed to walk in hospital room?: Total Help needed climbing 3-5 steps with a railing? : Total 6 Click Score: 10    End of  Session Equipment Utilized During Treatment: Gait belt Activity Tolerance: Patient tolerated treatment well Patient left: in chair;with call bell/phone within reach;with chair alarm set Nurse Communication: Mobility status (+2 assist transfer) PT Visit Diagnosis: Unsteadiness on feet (R26.81);Pain;Difficulty in walking, not elsewhere classified (R26.2);History of falling (Z91.81);Muscle weakness (generalized) (M62.81) Pain - Right/Left: Right Pain - part of body: Arm    Time: 6962-9528 PT Time Calculation (min) (ACUTE ONLY): 36 min   Charges:   PT Evaluation $PT Eval Moderate  Complexity: 1 Mod   PT General Charges $$ ACUTE PT VISIT: 1 Visit         Kathlyn Sacramento, PT, DPT [facility]  Rehabilitation Services Physical Therapist Office: [phone] Website: [email or web address]   Berton Mount 08/09/2023, 2:50 PM

## 2023-08-09 NOTE — Progress Notes (Addendum)
 PROGRESS NOTE  Sydney Evans ZOX:[id number] DOB: [birth year]   PCP: Excell Seltzer, MD  Patient is from: Home.  Lives alone.  Wheelchair dependent at baseline.  DOA: 08/08/2023 LOS: 1  Chief complaints No chief complaint on file.    Brief Narrative / Interim history: 72 year old F with PMH of CVA, right BKA, HTN, anxiety, bipolar disorder, tobacco use disorder, hyperlipidemia, right shoulder replacement and GERD sent to ED from orthopedics office due to incoherence and inability to care for herself.  Patient fell off wheelchair and seen in ED on 4/2. X-ray showed comminuted right humerus fracture, and she was discharged home from ED with a splint and sling for outpatient follow-up with Ortho.   Orthopedic surgery planning ORIF on 4/8.  Subjective: Seen and examined earlier this morning.  No major events overnight of this morning.  No major complaints other than pain in her right arm.  She rates her pain 7/10 and worse with movement.  Pain medication helps.  No other complaints.  She denies chest pain, shortness of breath, GI or UTI symptoms.  Objective: Vitals:   08/08/23 1400 08/09/23 0507 08/09/23 0815  BP: (!) 163/74 138/67 (!) 150/66  Pulse: (!) 57 (!) 58 60  Resp: 18 18 18   Temp: 98 F (36.7 C) 98.5 F (36.9 C)   TempSrc: Oral    SpO2: 94% 95% 92%  Weight: 67 kg    Height: 5\' 1"  (1.549 m)      Examination:  GENERAL: No acute distress.  Appears well.  HEENT: MMM.  Vision and hearing grossly intact.  NECK: Supple.  No apparent JVD.  RESP:  No IWOB. Good air movement bilaterally. CVS:  RRR. Heart sounds normal.  ABD/GI/GU: Bowel sounds present. Soft. Non tender.  MSK/EXT:  Moves extremities.  RLE swelling.  Neurovascular intact. SKIN: no apparent skin lesion or wound NEURO: Awake and oriented x 4.  Follows commands.  No focal neurodeficit but limited in RUE due to splint and sling. PSYCH: Calm.  No distress or agitation.  Consultants:  Orthopedic  surgery  Procedures: None  Microbiology summarized: None  Assessment and plan: Accidental fall at home-fell off WC in her kitchen when she tried to reach for something on the floor. Right lower lip and right neck skin laceration-right neck skin laceration repaired in ED 4/2. Right femoral fracture-x-ray finding as above. -Continue splint and sling.  Plan for ORIF by Dr. Eulah Pont on 08/12/2023 -Continue sling.  NWB on RUE. -Pain control with scheduled Tylenol, as needed oxycodone and fentanyl -Lovenox for DVT prophylaxis -PT/OT eval   Acute encephalopathy: Likely delirium and possibly iatrogenic.  No history of dementia.  Encephalopathy workup including basic labs UDS, VBG, UA, TSH, B12 and RPR unrevealing.  Ammonia 41.  CT head after fall on 4/2 without acute finding.  Encephalopathy improved.  She is currently awake and oriented x 4.  No focal neurodeficit -Minimize avoid sedating medications -Reorientation and delirium precaution -IV fluid hydration.   Essential hypertension: BP slightly elevated partly due to pain -Increase home amlodipine to 10 mg -Pain control   Anxiety and bipolar disorder -Continue home meds after med rec -Decreased on gabapentin.   History of right BKA: Wheelchair dependent. - PT/OT eval   History of CVA/hyperlipidemia - Continue home meds after med rec   Tobacco use disorder: Reports smoking about half a pack a day. - Encouraged smoking cessation.  Not interested in nicotine patch   Hypokalemia -Monitor replenish as appropriate  Body mass index is 27.91  kg/m.          DVT prophylaxis:  enoxaparin (LOVENOX) injection 40 mg Start: 08/09/23 1000  Code Status: DNR/DNI Family Communication: None at bedside Level of care: Med-Surg Status is: Inpatient Remains inpatient appropriate because: Right arm fracture, encephalopathy and physical deconditioning   Final disposition: Likely SNF   55 minutes with more than 50% spent in reviewing  records, counseling patient/family and coordinating care.   Sch Meds:  Scheduled Meds:  acetaminophen  1,000 mg Oral Q6H WA   [START ON 08/10/2023] amLODipine  10 mg Oral Daily   DULoxetine  60 mg Oral Daily   enoxaparin (LOVENOX) injection  40 mg Subcutaneous Q24H   gabapentin  200 mg Oral BID   lamoTRIgine  200 mg Oral Daily   latanoprost  1 drop Both Eyes QHS   pantoprazole  40 mg Oral BID   Continuous Infusions:  lactated ringers 75 mL/hr at 08/08/23 2139   PRN Meds:.albuterol, fentaNYL (SUBLIMAZE) injection, mouth rinse, oxyCODONE, polyethylene glycol, polyvinyl alcohol, senna-docusate  Antimicrobials: Anti-infectives (From admission, onward)    None        I have personally reviewed the following labs and images: CBC: Recent Labs  Lab 08/08/23 1524 08/09/23 0640  WBC 5.1 4.5  HGB 12.0 11.3*  HCT 35.5* 34.4*  MCV 87.4 88.4  PLT 262 250   BMP &GFR Recent Labs  Lab 08/08/23 1524 08/09/23 0640  NA 135 137  K 3.6 3.2*  CL 102 105  CO2 23 20*  GLUCOSE 89 80  BUN 5* <5*  CREATININE 0.84 0.78  CALCIUM 9.3 8.6*  MG 1.9 1.8  PHOS 2.9 2.9   Estimated Creatinine Clearance: 57.3 mL/min (by C-G formula based on SCr of 0.78 mg/dL). Liver & Pancreas: Recent Labs  Lab 08/08/23 1524 08/09/23 0640  AST 22  --   ALT 14  --   ALKPHOS 64  --   BILITOT 1.0  --   PROT 6.5  --   ALBUMIN 3.1* 2.6*   No results for input(s): "LIPASE", "AMYLASE" in the last 168 hours. Recent Labs  Lab 08/08/23 1608  AMMONIA 41*   Diabetic: No results for input(s): "HGBA1C" in the last 72 hours. No results for input(s): "GLUCAP" in the last 168 hours. Cardiac Enzymes: Recent Labs  Lab 08/08/23 1524  CKTOTAL 262*   No results for input(s): "PROBNP" in the last 8760 hours. Coagulation Profile: No results for input(s): "INR", "PROTIME" in the last 168 hours. Thyroid Function Tests: Recent Labs    08/08/23 1608  TSH 1.781   Lipid Profile: No results for input(s):  "CHOL", "HDL", "LDLCALC", "TRIG", "CHOLHDL", "LDLDIRECT" in the last 72 hours. Anemia Panel: Recent Labs    08/08/23 1608  VITAMINB12 1,342*   Urine analysis:    Component Value Date/Time   COLORURINE YELLOW 08/08/2023 1521   APPEARANCEUR CLEAR 08/08/2023 1521   APPEARANCEUR Clear 12/15/2019 1017   LABSPEC 1.003 (L) 08/08/2023 1521   PHURINE 7.0 08/08/2023 1521   GLUCOSEU NEGATIVE 08/08/2023 1521   HGBUR SMALL (A) 08/08/2023 1521   BILIRUBINUR NEGATIVE 08/08/2023 1521   BILIRUBINUR Negative 08/08/2022 1633   BILIRUBINUR Negative 12/15/2019 1017   KETONESUR NEGATIVE 08/08/2023 1521   PROTEINUR NEGATIVE 08/08/2023 1521   UROBILINOGEN 0.2 08/08/2022 1633   NITRITE NEGATIVE 08/08/2023 1521   LEUKOCYTESUR NEGATIVE 08/08/2023 1521   Sepsis Labs: Invalid input(s): "PROCALCITONIN", "LACTICIDVEN"  Microbiology: No results found for this or any previous visit (from the past 240 hours).  Radiology Studies: No  results found.    Sydney Evans T. Monica Zahler [facility]  If 7PM-7AM, please contact night-coverage [email or web address] 08/09/2023, 2:19 PM

## 2023-08-09 NOTE — H&P (Signed)
 Patient with a complex humerus fracture as well as complex medical history. Appreciate hospital service accepting direct admission. I am planning for ORIF of her humorous on Tuesday formal consult to follow

## 2023-08-09 NOTE — Evaluation (Signed)
 Occupational Therapy Evaluation Patient Details Name: Sydney Evans MRN: [id number] DOB: [birth year] Today's Date: 08/09/2023   History of Present Illness   73 y.o. female presenting 4/2 after fall from wheelchair. Found to have comminuted oblique/spiral midshaft fx of the R humerus. Plan for ORIF Tuesday. PMH significant for CVA, right BKA, HTN, anxiety, bipolar disorder, tobacco use disorder, hyperlipidemia, right shoulder replacement and GERD.     Clinical Impressions PTA, pt lived alone and was mostly independent but reports difficulty with showering. Pt had aide 3hours/day but pt and family reports aide not coming as scheduled. Upon eval, pt with edematous R hand and bandaging on RUE as well as sling; educated regarding NWB precautions and composite flexion/extension of hand to reduce edema. Pt needing +2 max A this session for SPT to chair maintaining NWB precautions without prosthesis donned as per pt and family report she has been asked not to wear prosthetic at this time. Encouraged family to prink shrinker to wear at rest. Patient will benefit from continued inpatient follow up therapy, <3 hours/day      If plan is discharge home, recommend the following:   Two people to help with walking and/or transfers;Two people to help with bathing/dressing/bathroom;Assistance with cooking/housework;Assist for transportation;Help with stairs or ramp for entrance     Functional Status Assessment   Patient has had a recent decline in their functional status and demonstrates the ability to make significant improvements in function in a reasonable and predictable amount of time.     Equipment Recommendations   Other (comment) (defer)     Recommendations for Other Services         Precautions/Restrictions   Precautions Precautions: Fall Recall of Precautions/Restrictions: Impaired Required Braces or Orthoses: Sling (RUE) Restrictions Weight Bearing Restrictions Per Provider  Order: Yes RUE Weight Bearing Per Provider Order: Non weight bearing Other Position/Activity Restrictions: Rt shoulder sling     Mobility Bed Mobility Overal bed mobility: Needs Assistance Bed Mobility: Supine to Sit     Supine to sit: Min assist, HOB elevated     General bed mobility comments: Min assist to rise, good trunk flexion without assist to long sit but needs help to turn and scoot to EOB.    Transfers Overall transfer level: Needs assistance Equipment used: 2 person hand held assist Transfers: Sit to/from Stand, Bed to chair/wheelchair/BSC Sit to Stand: Mod assist, +2 safety/equipment, +2 physical assistance, From elevated surface Stand pivot transfers: Max assist, +2 physical assistance, +2 safety/equipment         General transfer comment: Mod assist +2 for boost to stand, therapists on either side of pt to stabilize, LUE supported, assisted with weight shift towards left to offset imbalance without use of RUE and no prosthetic available for RLE. Max assist to pivot on LLE, does not buckle but needs significant help for balance and guide buttocks into chair gently.  Cues for techniques throughout.      Balance Overall balance assessment: Needs assistance Sitting-balance support: No upper extremity supported, Feet supported Sitting balance-Leahy Scale: Fair     Standing balance support: Single extremity supported Standing balance-Leahy Scale: Zero Standing balance comment: +2 assist for balance but can bear weight through LLE well                           ADL either performed or assessed with clinical judgement   ADL Overall ADL's : Needs assistance/impaired Eating/Feeding: Set up;Sitting Eating/Feeding Details (indicate cue type and  reason): pt daughter initially reporting pt unable to self feed with LUE but pt with fair strength in LUE as well as dexterity and ability to reach mouth to do so. Grooming: Minimal assistance;Sitting   Upper Body  Bathing: Moderate assistance;Sitting   Lower Body Bathing: Maximal assistance;+2 for physical assistance;+2 for safety/equipment;Sit to/from stand   Upper Body Dressing : Moderate assistance;Sitting   Lower Body Dressing: Maximal assistance;+2 for physical assistance;+2 for safety/equipment;Sit to/from stand                       Vision Patient Visual Report: No change from baseline Additional Comments: not formally assessed     Perception Perception: Not tested       Praxis Praxis: Not tested       Pertinent Vitals/Pain Pain Assessment Pain Assessment: 0-10 Pain Score: 7  Pain Location: RUE Pain Descriptors / Indicators: Aching Pain Intervention(s): Limited activity within patient's tolerance, Monitored during session     Extremity/Trunk Assessment Upper Extremity Assessment Upper Extremity Assessment: Generalized weakness;RUE deficits/detail RUE Deficits / Details: RUE bandaged/splinted and with significant edema in R hand. able to wiggle fingers on arrival. After hand over hand composite flexion/extension 10 reps, pt able to perform composite flexion/extension with AROM   Lower Extremity Assessment Lower Extremity Assessment: Defer to PT evaluation RLE Deficits / Details: Hx Rt BKA - states she cannot wear prosthesis right now due to fall and injuring her Rt residual limb       Communication Communication Communication: Impaired Factors Affecting Communication: Hearing impaired   Cognition Arousal: Alert Behavior During Therapy: WFL for tasks assessed/performed Cognition: No apparent impairments             OT - Cognition Comments: pt following basic commands, and is oriented.                 Following commands: Intact       Cueing  General Comments   Cueing Techniques: Verbal cues;Tactile cues  daughter with concerns re: pt self feeding. Pt able to do so with LUE but left note for nursing staff to provide set-up A   Exercises      Shoulder Instructions      Home Living Family/patient expects to be discharged to:: Private residence Living Arrangements: Alone Available Help at Discharge: Personal care attendant Type of Home: House Home Access: Level entry     Home Layout: One level     Bathroom Shower/Tub: Engineer, production Accessibility: No   Home Equipment: Agricultural consultant (2 wheels);BSC/3in1;Toilet riser;Tub bench          Prior Functioning/Environment Prior Level of Function : Needs assist;History of Falls (last six months)             Mobility Comments: Family reports pt fell recnetly and is not using prosthesis lately while residual limb heal. Was using RW. Now mostly in w/c. ADLs Comments: Family reports pt cares for herself during the day. Has an aide that (questionably) come to assist for 3hr/day 7x/week but feel she was not receiving that much care.    OT Problem List: Decreased strength;Decreased activity tolerance;Decreased range of motion;Impaired balance (sitting and/or standing);Decreased coordination;Decreased safety awareness;Decreased knowledge of use of DME or AE;Decreased knowledge of precautions;Impaired UE functional use   OT Treatment/Interventions: Self-care/ADL training;Therapeutic exercise;DME and/or AE instruction;Balance training;Patient/family education;Therapeutic activities      OT Goals(Current goals can be found in the care plan section)   Acute Rehab OT  Goals Patient Stated Goal: get better OT Goal Formulation: With patient Time For Goal Achievement: 08/23/23 Potential to Achieve Goals: Good   OT Frequency:  Min 1X/week    Co-evaluation PT/OT/SLP Co-Evaluation/Treatment: Yes Reason for Co-Treatment: Complexity of the patient's impairments (multi-system involvement);For patient/therapist safety;To address functional/ADL transfers PT goals addressed during session: Mobility/safety with mobility;Balance;Proper use of DME OT goals addressed during  session: ADL's and self-care      AM-PAC OT "6 Clicks" Daily Activity     Outcome Measure Help from another person eating meals?: A Little Help from another person taking care of personal grooming?: A Little Help from another person toileting, which includes using toliet, bedpan, or urinal?: A Lot Help from another person bathing (including washing, rinsing, drying)?: A Lot Help from another person to put on and taking off regular upper body clothing?: A Lot Help from another person to put on and taking off regular lower body clothing?: A Lot 6 Click Score: 14   End of Session Equipment Utilized During Treatment: Gait belt;Other (comment) (R shoulder sling) Nurse Communication: Mobility status  Activity Tolerance: Patient tolerated treatment well Patient left: with call bell/phone within reach;in chair;with chair alarm set  OT Visit Diagnosis: Unsteadiness on feet (R26.81);Muscle weakness (generalized) (M62.81);Pain;Other abnormalities of gait and mobility (R26.89);History of falling (Z91.81) Pain - Right/Left: Right Pain - part of body: Shoulder;Arm                Time: 1610-9604 OT Time Calculation (min): 33 min Charges:  OT General Charges $OT Visit: 1 Visit OT Evaluation $OT Eval Moderate Complexity: 1 Mod  Tyler Deis, OTR/L [facility] Acute Rehabilitation Office: [phone]   Myrla Halsted 08/09/2023, 4:04 PM

## 2023-08-10 DIAGNOSIS — S42301D Unspecified fracture of shaft of humerus, right arm, subsequent encounter for fracture with routine healing: Secondary | ICD-10-CM | POA: Diagnosis not present

## 2023-08-10 LAB — RENAL FUNCTION PANEL
Albumin: 2.7 g/dL — ABNORMAL LOW (ref 3.5–5.0)
Anion gap: 12 (ref 5–15)
BUN: <5 mg/dL — ABNORMAL LOW (ref 8–23)
CO2: 20 mmol/L — ABNORMAL LOW (ref 22–32)
Calcium: 8.5 mg/dL — ABNORMAL LOW (ref 8.9–10.3)
Chloride: 106 mmol/L (ref 98–111)
Creatinine, Ser: 0.59 mg/dL (ref 0.44–1.00)
GFR, Estimated: >60 mL/min (ref >60)
Glucose, Bld: 83 mg/dL (ref 70–99)
Phosphorus: 2.7 mg/dL (ref 2.5–4.6)
Potassium: 3.5 mmol/L (ref 3.5–5.1)
Sodium: 138 mmol/L (ref 135–145)

## 2023-08-10 LAB — SURGICAL PCR SCREEN
MRSA, PCR: NEGATIVE
Staphylococcus aureus: NEGATIVE

## 2023-08-10 LAB — MAGNESIUM: Magnesium: 1.7 mg/dL (ref 1.7–2.4)

## 2023-08-10 MED ORDER — ATORVASTATIN CALCIUM 10 MG PO TABS
20.0000 mg | ORAL_TABLET | Freq: Every day | ORAL | Status: DC
Start: 2023-08-10 — End: 2023-08-15
  Administered 2023-08-10 – 2023-08-15 (×6): 20 mg via ORAL
  Filled 2023-08-10 (×6): qty 2

## 2023-08-10 NOTE — H&P (View-Only) (Signed)
 ORTHOPAEDIC CONSULTATION  REQUESTING PHYSICIAN: Tyrone Nine, MD  Chief Complaint: Right arm fracture  HPI: Sydney Evans is a 73 y.o. female who complains of new onset right arm pain.  On 4/2 patient fell at home out of her wheelchair and onto ground.  Seen in ED office same day and informed she had a fracture and needed to follow up with orthopedics.  Saw Dr. Eulah Pont Friday morning 4/4 and was sent to the ED due to inability to care for herself.  Initial plan for ORIF this Tuesday 4/8.  Noted swelling to right hand.  Placed in splint and sling.  Patient endorses some decreased sensation throughout the right hand and the right upper arm which was not present before her fall.  Denies tingling, burning sensation, or weakness of right upper extremity.  Hx of right shoulder replacement.  Not on anticoagulation.   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   Anemia    Anxiety    Arthritis    Barrett's esophagus    Bipolar disorder (HCC)    Blocked artery    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   Blood clot in vein    Cancer (HCC)    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   Carotid arterial disease (HCC)    Contracture of joint of upper arm    Critical limb ischemia of right lower extremity (HCC) 06/21/2021   Depression    Dislocation of right shoulder joint 10/06/2019   Disorder of bone and articular cartilage 09/29/2014   Elevated lipids    GERD (gastroesophageal reflux disease)    Hypertension    Migraine    Osteoporosis    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   Poor balance    Rotator cuff tendinitis, left 05/20/2016   Sinus congestion    Status post total shoulder arthroplasty, 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   Status post total shoulder replacement, 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   Stroke (HCC)    x 2   TIA (transient ischemic attack)    Traumatic complete tear of right rotator cuff 10/08/2019   Vertigo    Past Surgical History:  Procedure Laterality Date   ABDOMINAL HYSTERECTOMY     pt states  might still have ovaries   BIOPSY  01/31/2023   Procedure: BIOPSY;  Surgeon: Regis Bill, MD;  Location: [facility] ENDOSCOPY;  Service: Endoscopy;;   bka right     BREAST BIOPSY Left    CARPAL TUNNEL RELEASE Bilateral    CATARACT EXTRACTION W/ INTRAOCULAR LENS  IMPLANT, BILATERAL Bilateral    CHOLECYSTECTOMY     COLONOSCOPY N/A 01/31/2023   Procedure: COLONOSCOPY;  Surgeon: Regis Bill, MD;  Location: [facility] ENDOSCOPY;  Service: Endoscopy;  Laterality: N/A;   COLONOSCOPY WITH PROPOFOL N/A 04/09/2018   Procedure: COLONOSCOPY WITH PROPOFOL;  Surgeon: Wyline Mood, MD;  Location: [facility] ENDOSCOPY;  Service: Gastroenterology;  Laterality: N/A;   crystal cyst removed on left foot     ESOPHAGOGASTRODUODENOSCOPY (EGD) WITH PROPOFOL N/A 04/09/2018   Procedure: ESOPHAGOGASTRODUODENOSCOPY (EGD) WITH PROPOFOL;  Surgeon: Wyline Mood, MD;  Location: [facility] ENDOSCOPY;  Service: Gastroenterology;  Laterality: N/A;   ESOPHAGOGASTRODUODENOSCOPY (EGD) WITH PROPOFOL N/A 12/14/2019   Procedure: ESOPHAGOGASTRODUODENOSCOPY (EGD) WITH PROPOFOL;  Surgeon: Regis Bill, MD;  Location: [facility] ENDOSCOPY;  Service: Endoscopy;  Laterality: N/A;   ESOPHAGOGASTRODUODENOSCOPY (EGD) WITH PROPOFOL N/A 01/31/2023   Procedure: ESOPHAGOGASTRODUODENOSCOPY (EGD) WITH PROPOFOL;  Surgeon: Regis Bill, MD;  Location: [facility] ENDOSCOPY;  Service: Endoscopy;  Laterality: N/A;   EYE  SURGERY     femoral fx     FOOT SURGERY     GIVENS CAPSULE STUDY N/A 06/16/2018   Procedure: GIVENS CAPSULE STUDY;  Surgeon: Wyline Mood, MD;  Location: [facility] ENDOSCOPY;  Service: Gastroenterology;  Laterality: N/A;   JOINT REPLACEMENT     KNEE SURGERY     KYPHOPLASTY     LOWER EXTREMITY ANGIOGRAPHY Right 06/21/2021   Procedure: Lower Extremity Angiography;  Surgeon: Renford Dills, MD;  Location: [facility] INVASIVE CV LAB;  Service: Cardiovascular;  Laterality: Right;   ORIF PERIPROSTHETIC FRACTURE Right 10/07/2019   Procedure: OPEN REDUCTION  INTERNAL FIXATION (ORIF) PERIPROSTHETIC FRACTURE;  Surgeon: Christena Flake, MD;  Location: [facility] ORS;  Service: Orthopedics;  Laterality: Right;   POLYPECTOMY  01/31/2023   Procedure: POLYPECTOMY;  Surgeon: Regis Bill, MD;  Location: [facility] ENDOSCOPY;  Service: Endoscopy;;   TONSILLECTOMY     TOTAL KNEE ARTHROPLASTY Bilateral    TOTAL SHOULDER ARTHROPLASTY Left 08/01/2016   Procedure: TOTAL SHOULDER ARTHROPLASTY;  Surgeon: Christena Flake, MD;  Location: [facility] ORS;  Service: Orthopedics;  Laterality: Left;   TOTAL SHOULDER ARTHROPLASTY Right 09/30/2019   Procedure: TOTAL SHOULDER ARTHROPLASTY;  Surgeon: Christena Flake, MD;  Location: [facility] ORS;  Service: Orthopedics;  Laterality: Right;   Social History   Socioeconomic History   Marital status: Single    Spouse name: Not on file   Number of children: 3   Years of education: Not on file   Highest education level: Some college, no degree  Occupational History   Not on file  Tobacco Use   Smoking status: Every Day    Current packs/day: 0.00    Types: Cigarettes    Last attempt to quit: 08/04/2016    Years since quitting: 7.0   Smokeless tobacco: Never   Tobacco comments:    1 pack lasts about 2.5 days  Vaping Use   Vaping status: Never Used  Substance and Sexual Activity   Alcohol use: No   Drug use: No   Sexual activity: Not Currently    Birth control/protection: Surgical  Other Topics Concern   Not on file  Social History Narrative   Lives at home alone in an apt   Right handed   Disabled since 1998   Caffeine: about 30 oz daily   Social Drivers of Health   Financial Resource Strain: Low Risk  (02/15/2020)   Overall Financial Resource Strain (CARDIA)    Difficulty of Paying Living Expenses: Not hard at all  Food Insecurity: No Food Insecurity (08/08/2023)   Hunger Vital Sign    Worried About Running Out of Food in the Last Year: Never true    Ran Out of Food in the Last Year: Never true  Transportation Needs: Patient  Unable To Answer (08/08/2023)   PRAPARE - Transportation    Lack of Transportation (Medical): Patient unable to answer    Lack of Transportation (Non-Medical): Patient unable to answer  Physical Activity: Insufficiently Active (11/26/2021)   Exercise Vital Sign    Days of Exercise per Week: 2 days    Minutes of Exercise per Session: 30 min  Stress: Stress Concern Present (02/15/2020)   [employer] of Occupational Health - Occupational Stress Questionnaire    Feeling of Stress : Very much  Social Connections: Patient Unable To Answer (08/08/2023)   Social Connection and Isolation Panel [NHANES]    Frequency of Communication with Friends and Family: Patient unable to answer    Frequency of Social Gatherings  with Friends and Family: Patient unable to answer    Attends Religious Services: Patient unable to answer    Active Member of Clubs or Organizations: Patient unable to answer    Attends Banker Meetings: Patient unable to answer    Marital Status: Patient unable to answer   Family History  Problem Relation Age of Onset   Other Mother        ?lupus    Cancer Mother    High Cholesterol Mother    Alcohol abuse Father    CAD Father        CABG   High Cholesterol Father    Arthritis Father    Heart murmur Sister    Bradycardia Sister    Heart murmur Brother    High blood pressure Other        "for everybody"   High Cholesterol Other        "for everybody"   Allergies  Allergen Reactions   Hydroxyzine Hives and Rash   Bactrim [Sulfamethoxazole-Trimethoprim] Nausea And Vomiting   Dilaudid [Hydromorphone] Itching   Ketorolac Rash     May take with benadryl   Toradol [Ketorolac Tromethamine] Rash    May take with benadryl   Tramadol Hives   Zofran [Ondansetron] Other (See Comments)    Broke mouth out     Positive ROS: All other systems have been reviewed and were otherwise negative with the exception of those mentioned in the HPI and as above.  Physical  Exam: General: Alert, no acute distress Cardiovascular: No pedal edema Respiratory: No cyanosis, no use of accessory musculature Skin: No lesions in the area of chief complaint Neurologic: Sensation intact distally Psychiatric: Patient is competent for consent with normal mood and affect  MUSCULOSKELETAL:   RUE  No traumatic wounds or rash.  Mild-moderate ecchymosis of upper arm directly superior to axilla.   Long arm splint in place, visual exam limited.  Exposed skin intact.  Acewraps appear dry and intact, though mildly constrictive Tenderness of mid-humerus to palpation Swelling appreciated of left hand and fingers, see photo below Sens globally somewhat decreased of hand in radial and medial distribution, ulnar aspect appears intact Motor AIN, PIN, IO intact; 5/5 strength and good ROM of digits with encouragement; wiggles fingers appropriately Radial pulse 2+, CRT < 2     IMAGING: Acute closed comminuted oblique/spiral mid-shaft fracture of right humerus.  Sizable butterfly fragment appreciated.  Notable mild-moderate posterior and lateral angulation of main distal fragment.  Medial angulation of main proximal fracture component.  Noted right shoulder replacement hardware appears grossly intact.    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  The Ace wrap was loosened and patient instructed to elevate hand to reduce swelling.  Mild decrease sensation of the right hand, likely due to  injury related swelling, however perfusion, range of motion, and strength reassuring.  Continue to monitor swelling and sensory symptoms.  Patient unfortunately has sustained a complex mid-shaft humerus fracture.  Discussed patient with attending  Dr. Blanchie Dessert.  At this point surgical intervention is recommended to stabilize and fixate the fracture.   The risks benefits and alternatives were discussed with the patient including but not limited to the risks of nonoperative treatment, versus surgical intervention including infection, bleeding, nerve injury, malunion, nonunion, the need for revision surgery, hardware prominence, hardware failure, the need for hardware removal, blood clots, cardiopulmonary complications, morbidity, mortality, among others, and they were willing to proceed.   Plan for surgery this coming Tuesday with Dr. Eulah Pont pending OR availability.  NWB RUE and consistent use of sling until surgery.  NPO midnight before surgery (Monday).  Hold Lovenox after tomorrow morning's dose.   Cecil Cobbs, PA-C  Contact information:   [phone] 7am-5pm epic message Dr. Blanchie Dessert, or call office for patient follow up: [phone] After hours and holidays please check [email or web address] for group call information for [facility]

## 2023-08-10 NOTE — Care Management CC44 (Signed)
 Condition Code 44 Documentation Completed  Patient Details  Name: Sydney Evans MRN: [id number] Date of Birth: 01/07/1951   Condition Code 44 given:  Yes Patient signature on Condition Code 44 notice:   (refused) Documentation of 2 MD's agreement:  Yes Code 44 added to claim:  Yes    Ronny Bacon, RN 08/10/2023, 9:29 AM

## 2023-08-10 NOTE — Plan of Care (Signed)

## 2023-08-10 NOTE — TOC Initial Note (Addendum)
 Transition of Care [facility]) - Initial/Assessment Note    Patient Details  Name: Sydney Evans MRN: [id number] Date of Birth: 11-08-1950  [facility] CM/SW Contact:    Jimmy Picket, LCSW Phone Number: 08/10/2023, 12:26 PM  Clinical Narrative:                  CSW received SNF consult. CSW met with pt  and daughter [name] at bedside. CSW introduced self and explained role at the hospital. Pt reports that PTA pt lives at home alone. Pt has a rolling walker and electric wheelchair she uses at baseline. Pt has a aid that comes in 3 hours a day 7 days a week. [name] stared that pts aid, Dub Amis is stating she is patients niece, and she is not. [name] stated she is fine with [name] visiting member at the hospital but does not want to receive any medical updates and to not make any decisions. [name] also shares that [name] has access to pts bank information and a key to her home. [name] is working with [name] boss and hopes to have a new PCA. [name] states he is also planning to change pts bank information and locks so she will no longer have access.   CSW reviewed PT/OT recommendations for SNF. Pt reports she is fine going to a SNF. [name] stated that pt went to [facility] in the past but was able to sign herself out after a couple of days, and doesn't want her to do that again. CSW explained that a SNF cannot force pt to stay against her will. Pt stated she wont sign herself out early this time. Pt gave CSW permission to fax out to facilities in the area. Pt prefers to return to [facility] but is open to other facilities in [place] or [place]. CSW was unable to complete passr due to information in the system being incorrect. CSW gave pt medicare.gov rating list to review. CSW explained insurance auth process.  CSW will continue to follow.  Expected Discharge Plan: Skilled Nursing Facility Barriers to Discharge: Continued Medical Work up   Patient Goals and CMS Choice Patient  states their goals for this hospitalization and ongoing recovery are:: To go to rehab CMS Medicare.gov Compare Post Acute Care list provided to:: Patient Choice offered to / list presented to : Patient [place] ownership interest in [facility].provided to:: Patient    Expected Discharge Plan and Services       Living arrangements for the past 2 months: Single Family Home                                      Prior Living Arrangements/Services Living arrangements for the past 2 months: Single Family Home Lives with:: Self Patient language and need for interpreter reviewed:: Yes Do you feel safe going back to the place where you live?: Yes      Need for Family Participation in Patient Care: Yes (Comment) Care giver support system in place?: Yes (comment)   Criminal Activity/Legal Involvement Pertinent to Current Situation/Hospitalization: No - Comment as needed  Activities of Daily Living   ADL Screening (condition at time of admission) Independently performs ADLs?: No Does the patient have a NEW difficulty with bathing/dressing/toileting/self-feeding that is expected to last >3 days?: Yes (Initiates electronic notice to provider for possible OT consult) Does the patient have a NEW difficulty with getting  in/out of bed, walking, or climbing stairs that is expected to last >3 days?: Yes (Initiates electronic notice to provider for possible PT consult) Does the patient have a NEW difficulty with communication that is expected to last >3 days?: No Is the patient deaf or have difficulty hearing?: No Does the patient have difficulty seeing, even when wearing glasses/contacts?: No Does the patient have difficulty concentrating, remembering, or making decisions?: Yes  Permission Sought/Granted Permission sought to share information with : Facility Engineer, maintenance (IT) granted to share info w AGENCY: SNF        Emotional Assessment Appearance::  Appears stated age Attitude/Demeanor/Rapport: Engaged Affect (typically observed): Appropriate Orientation: : Oriented to Self, Oriented to Place, Oriented to  Time, Oriented to Situation Alcohol / Substance Use: Not Applicable Psych Involvement: No (comment)  Admission diagnosis:  Right arm fracture [S42.301A] Patient Active Problem List   Diagnosis Date Noted   Right arm fracture 08/08/2023   Acute encephalopathy 08/08/2023   Concussion with no loss of consciousness 07/08/2023   Fall at home, initial encounter 06/25/2023   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) 06/25/2023   Right arm pain 06/25/2023   Abnormal metabolism 06/25/2023   Perineal abscess 04/29/2023   Urinary urgency 03/27/2023   Decreased GFR 03/27/2023   Hyperglycemia 03/26/2023   B12 deficiency 12/12/2022   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   Recurrent boils 09/05/2022   Acute diarrhea 08/07/2022   Herpes simplex vulvovaginitis 06/11/2022   Weakness 05/21/2022   PAD (peripheral artery disease) (HCC) 03/26/2022   Anterolisthesis of cervical spine 11/13/2021   Facet arthropathy, cervical 11/13/2021   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/04/2021   History of cervical cancer 09/03/2021   History of uterine cancer 09/03/2021   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   Critical limb ischemia of right lower extremity (HCC) 08/20/2021   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   Open-angle glaucoma 08/01/2021   Age-related osteoporosis without current pathological fracture 08/01/2021   Vitamin D deficiency 08/01/2021   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) 08/01/2021   Limitation of activity due to disability 08/01/2021   Tobacco use disorder 07/04/2021   Duodenal nodule 03/17/2020   Bipolar depression (HCC) 11/17/2019   Anxiety 11/17/2019   Generalized abdominal pain    NSVT (nonsustained ventricular tachycardia) (HCC)    Degeneration of lumbar intervertebral disc  03/24/2018   Iron deficiency anemia 10/05/2017   Carotid stenosis 11/17/2016   GERD (gastroesophageal reflux disease) 11/17/2016   Essential hypertension 11/17/2016   Hyperlipidemia 11/17/2016   History of TIAs 07/17/2015   Localized, 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   PCP:  Excell Seltzer, MD Pharmacy:   [facility] [address] ([address]), [place] - [address] [place]) [place] [address] Phone: [phone] Fax: [phone]     Social Drivers of Health (SDOH) Social History: SDOH Screenings   Food Insecurity: No Food Insecurity (08/08/2023)  Housing: Patient Unable To Answer (08/08/2023)  Transportation Needs: Patient Unable To Answer (08/08/2023)  Utilities: Patient Unable To Answer (08/08/2023)  Alcohol Screen: Low Risk  (09/09/2022)  Depression (PHQ2-9): Low Risk  (12/24/2022)  Financial Resource Strain: Low Risk  (02/15/2020)  Physical Activity: Insufficiently Active (11/26/2021)  Social Connections: Patient Unable To Answer (08/08/2023)  Stress: Stress Concern Present (02/15/2020)  Tobacco Use: High Risk (08/08/2023)   SDOH Interventions:     Readmission Risk Interventions     No data to display

## 2023-08-10 NOTE — Care Management Obs Status (Signed)
 MEDICARE OBSERVATION STATUS NOTIFICATION   Patient Details  Name: Sydney Evans MRN: [id number] Date of Birth: [birth year]   Medicare Observation Status Notification Given:  No (patient and family refused to accept code 16 observation status)    Ronny Bacon, RN 08/10/2023, 9:27 AM

## 2023-08-10 NOTE — Consult Note (Signed)
 ORTHOPAEDIC CONSULTATION  REQUESTING PHYSICIAN: Tyrone Nine, MD  Chief Complaint: Right arm fracture  HPI: Sydney Evans is a 73 y.o. female who complains of new onset right arm pain.  On 4/2 patient fell at home out of her wheelchair and onto ground.  Seen in ED office same day and informed she had a fracture and needed to follow up with orthopedics.  Saw Dr. Eulah Pont Friday morning 4/4 and was sent to the ED due to inability to care for herself.  Initial plan for ORIF this Tuesday 4/8.  Noted swelling to right hand.  Placed in splint and sling.  Patient endorses some decreased sensation throughout the right hand and the right upper arm which was not present before her fall.  Denies tingling, burning sensation, or weakness of right upper extremity.  Hx of right shoulder replacement.  Not on anticoagulation.   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   Anemia    Anxiety    Arthritis    Barrett's esophagus    Bipolar disorder (HCC)    Blocked artery    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   Blood clot in vein    Cancer (HCC)    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   Carotid arterial disease (HCC)    Contracture of joint of upper arm    Critical limb ischemia of right lower extremity (HCC) 06/21/2021   Depression    Dislocation of right shoulder joint 10/06/2019   Disorder of bone and articular cartilage 09/29/2014   Elevated lipids    GERD (gastroesophageal reflux disease)    Hypertension    Migraine    Osteoporosis    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   Poor balance    Rotator cuff tendinitis, left 05/20/2016   Sinus congestion    Status post total shoulder arthroplasty, 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   Status post total shoulder replacement, 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   Stroke (HCC)    x 2   TIA (transient ischemic attack)    Traumatic complete tear of right rotator cuff 10/08/2019   Vertigo    Past Surgical History:  Procedure Laterality Date   ABDOMINAL HYSTERECTOMY     pt states  might still have ovaries   BIOPSY  01/31/2023   Procedure: BIOPSY;  Surgeon: Regis Bill, MD;  Location: [facility] ENDOSCOPY;  Service: Endoscopy;;   bka right     BREAST BIOPSY Left    CARPAL TUNNEL RELEASE Bilateral    CATARACT EXTRACTION W/ INTRAOCULAR LENS  IMPLANT, BILATERAL Bilateral    CHOLECYSTECTOMY     COLONOSCOPY N/A 01/31/2023   Procedure: COLONOSCOPY;  Surgeon: Regis Bill, MD;  Location: [facility] ENDOSCOPY;  Service: Endoscopy;  Laterality: N/A;   COLONOSCOPY WITH PROPOFOL N/A 04/09/2018   Procedure: COLONOSCOPY WITH PROPOFOL;  Surgeon: Wyline Mood, MD;  Location: [facility] ENDOSCOPY;  Service: Gastroenterology;  Laterality: N/A;   crystal cyst removed on left foot     ESOPHAGOGASTRODUODENOSCOPY (EGD) WITH PROPOFOL N/A 04/09/2018   Procedure: ESOPHAGOGASTRODUODENOSCOPY (EGD) WITH PROPOFOL;  Surgeon: Wyline Mood, MD;  Location: [facility] ENDOSCOPY;  Service: Gastroenterology;  Laterality: N/A;   ESOPHAGOGASTRODUODENOSCOPY (EGD) WITH PROPOFOL N/A 12/14/2019   Procedure: ESOPHAGOGASTRODUODENOSCOPY (EGD) WITH PROPOFOL;  Surgeon: Regis Bill, MD;  Location: [facility] ENDOSCOPY;  Service: Endoscopy;  Laterality: N/A;   ESOPHAGOGASTRODUODENOSCOPY (EGD) WITH PROPOFOL N/A 01/31/2023   Procedure: ESOPHAGOGASTRODUODENOSCOPY (EGD) WITH PROPOFOL;  Surgeon: Regis Bill, MD;  Location: [facility] ENDOSCOPY;  Service: Endoscopy;  Laterality: N/A;   EYE  SURGERY     femoral fx     FOOT SURGERY     GIVENS CAPSULE STUDY N/A 06/16/2018   Procedure: GIVENS CAPSULE STUDY;  Surgeon: Wyline Mood, MD;  Location: [facility] ENDOSCOPY;  Service: Gastroenterology;  Laterality: N/A;   JOINT REPLACEMENT     KNEE SURGERY     KYPHOPLASTY     LOWER EXTREMITY ANGIOGRAPHY Right 06/21/2021   Procedure: Lower Extremity Angiography;  Surgeon: Renford Dills, MD;  Location: [facility] INVASIVE CV LAB;  Service: Cardiovascular;  Laterality: Right;   ORIF PERIPROSTHETIC FRACTURE Right 10/07/2019   Procedure: OPEN REDUCTION  INTERNAL FIXATION (ORIF) PERIPROSTHETIC FRACTURE;  Surgeon: Christena Flake, MD;  Location: [facility] ORS;  Service: Orthopedics;  Laterality: Right;   POLYPECTOMY  01/31/2023   Procedure: POLYPECTOMY;  Surgeon: Regis Bill, MD;  Location: [facility] ENDOSCOPY;  Service: Endoscopy;;   TONSILLECTOMY     TOTAL KNEE ARTHROPLASTY Bilateral    TOTAL SHOULDER ARTHROPLASTY Left 08/01/2016   Procedure: TOTAL SHOULDER ARTHROPLASTY;  Surgeon: Christena Flake, MD;  Location: [facility] ORS;  Service: Orthopedics;  Laterality: Left;   TOTAL SHOULDER ARTHROPLASTY Right 09/30/2019   Procedure: TOTAL SHOULDER ARTHROPLASTY;  Surgeon: Christena Flake, MD;  Location: [facility] ORS;  Service: Orthopedics;  Laterality: Right;   Social History   Socioeconomic History   Marital status: Single    Spouse name: Not on file   Number of children: 3   Years of education: Not on file   Highest education level: Some college, no degree  Occupational History   Not on file  Tobacco Use   Smoking status: Every Day    Current packs/day: 0.00    Types: Cigarettes    Last attempt to quit: 08/04/2016    Years since quitting: 7.0   Smokeless tobacco: Never   Tobacco comments:    1 pack lasts about 2.5 days  Vaping Use   Vaping status: Never Used  Substance and Sexual Activity   Alcohol use: No   Drug use: No   Sexual activity: Not Currently    Birth control/protection: Surgical  Other Topics Concern   Not on file  Social History Narrative   Lives at home alone in an apt   Right handed   Disabled since 1998   Caffeine: about 30 oz daily   Social Drivers of Health   Financial Resource Strain: Low Risk  (02/15/2020)   Overall Financial Resource Strain (CARDIA)    Difficulty of Paying Living Expenses: Not hard at all  Food Insecurity: No Food Insecurity (08/08/2023)   Hunger Vital Sign    Worried About Running Out of Food in the Last Year: Never true    Ran Out of Food in the Last Year: Never true  Transportation Needs: Patient  Unable To Answer (08/08/2023)   PRAPARE - Transportation    Lack of Transportation (Medical): Patient unable to answer    Lack of Transportation (Non-Medical): Patient unable to answer  Physical Activity: Insufficiently Active (11/26/2021)   Exercise Vital Sign    Days of Exercise per Week: 2 days    Minutes of Exercise per Session: 30 min  Stress: Stress Concern Present (02/15/2020)   [employer] of Occupational Health - Occupational Stress Questionnaire    Feeling of Stress : Very much  Social Connections: Patient Unable To Answer (08/08/2023)   Social Connection and Isolation Panel [NHANES]    Frequency of Communication with Friends and Family: Patient unable to answer    Frequency of Social Gatherings  with Friends and Family: Patient unable to answer    Attends Religious Services: Patient unable to answer    Active Member of Clubs or Organizations: Patient unable to answer    Attends Banker Meetings: Patient unable to answer    Marital Status: Patient unable to answer   Family History  Problem Relation Age of Onset   Other Mother        ?lupus    Cancer Mother    High Cholesterol Mother    Alcohol abuse Father    CAD Father        CABG   High Cholesterol Father    Arthritis Father    Heart murmur Sister    Bradycardia Sister    Heart murmur Brother    High blood pressure Other        "for everybody"   High Cholesterol Other        "for everybody"   Allergies  Allergen Reactions   Hydroxyzine Hives and Rash   Bactrim [Sulfamethoxazole-Trimethoprim] Nausea And Vomiting   Dilaudid [Hydromorphone] Itching   Ketorolac Rash     May take with benadryl   Toradol [Ketorolac Tromethamine] Rash    May take with benadryl   Tramadol Hives   Zofran [Ondansetron] Other (See Comments)    Broke mouth out     Positive ROS: All other systems have been reviewed and were otherwise negative with the exception of those mentioned in the HPI and as above.  Physical  Exam: General: Alert, no acute distress Cardiovascular: No pedal edema Respiratory: No cyanosis, no use of accessory musculature Skin: No lesions in the area of chief complaint Neurologic: Sensation intact distally Psychiatric: Patient is competent for consent with normal mood and affect  MUSCULOSKELETAL:   RUE  No traumatic wounds or rash.  Mild-moderate ecchymosis of upper arm directly superior to axilla.   Long arm splint in place, visual exam limited.  Exposed skin intact.  Acewraps appear dry and intact, though mildly constrictive Tenderness of mid-humerus to palpation Swelling appreciated of left hand and fingers, see photo below Sens globally somewhat decreased of hand in radial and medial distribution, ulnar aspect appears intact Motor AIN, PIN, IO intact; 5/5 strength and good ROM of digits with encouragement; wiggles fingers appropriately Radial pulse 2+, CRT < 2     IMAGING: Acute closed comminuted oblique/spiral mid-shaft fracture of right humerus.  Sizable butterfly fragment appreciated.  Notable mild-moderate posterior and lateral angulation of main distal fragment.  Medial angulation of main proximal fracture component.  Noted right shoulder replacement hardware appears grossly intact.    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  The Ace wrap was loosened and patient instructed to elevate hand to reduce swelling.  Mild decrease sensation of the right hand, likely due to  injury related swelling, however perfusion, range of motion, and strength reassuring.  Continue to monitor swelling and sensory symptoms.  Patient unfortunately has sustained a complex mid-shaft humerus fracture.  Discussed patient with attending  Dr. Blanchie Dessert.  At this point surgical intervention is recommended to stabilize and fixate the fracture.   The risks benefits and alternatives were discussed with the patient including but not limited to the risks of nonoperative treatment, versus surgical intervention including infection, bleeding, nerve injury, malunion, nonunion, the need for revision surgery, hardware prominence, hardware failure, the need for hardware removal, blood clots, cardiopulmonary complications, morbidity, mortality, among others, and they were willing to proceed.   Plan for surgery this coming Tuesday with Dr. Eulah Pont pending OR availability.  NWB RUE and consistent use of sling until surgery.  NPO midnight before surgery (Monday).  Hold Lovenox after tomorrow morning's dose.   Cecil Cobbs, PA-C  Contact information:   [phone] 7am-5pm epic message Dr. Blanchie Dessert, or call office for patient follow up: [phone] After hours and holidays please check [email or web address] for group call information for [facility]

## 2023-08-10 NOTE — Progress Notes (Signed)
 [facility] PROGRESS NOTE  Sydney Evans (DOB: 1950/12/12) WUJ:[id number] PCP: Excell Seltzer, MD  Brief Narrative: 73 year old F with PMH of CVA, right BKA, HTN, anxiety, bipolar disorder, tobacco use disorder, hyperlipidemia, right shoulder replacement and GERD sent to ED from orthopedics office due to incoherence and inability to care for herself.  Patient fell off wheelchair and seen in ED on 4/2. X-ray showed comminuted right humerus fracture, and she was discharged home from ED with a splint and sling for outpatient follow-up with Ortho. At outpatient follow up with Dr. Eulah Pont, she was noted to be unable to care for herself adequately at home, so was sent to the ED with plan for ORIF on 4/8.  Subjective: Pain is controlled with oral medications, no vomiting. There was much anxiety about the potential for the patient being discharged prior to surgery. She and her family are very clear that she would be at very high risk for poor clinical outcome and/or readmission if discharged.   Objective: BP 128/89 (BP Location: Left Arm)   Pulse (!) 51   Temp 97.6 F (36.4 C) (Oral)   Resp 18   Ht 5\' 1"  (1.549 m)   Wt 67 kg   SpO2 94%   BMI 27.91 kg/m   Gen: Frail appearing female in no acute distress Pulm: Clear, diminished, nonlabored  CV: Regular borderline bradycardia, no pitting dependent edema GI: Soft, NT, ND, +BS  Neuro: Alert and oriented. No new focal deficits. Ext: Warm, dry, edematous RUE with restricted ROM, Strength and sensation are intact at the hand/digits which are warm and well perfused.  Assessment & Plan: Fall at home-fell off WC in her kitchen when she tried to reach for something on the floor. Right lower lip and right neck skin laceration-right neck skin laceration repaired in ED 4/2. Right proximal humerus fracture:  - Continue splint/sling NWB RUE, pt posted for ORIF by Dr. Renaye Rakers 4/8.  - Continue scheduled tylenol, gabapentin, as needed oxycodone and  fentanyl - Lovenox for DVT prophylaxis - PT/OT eval > anticipate need for SNF postoperatively, will pursue placement.    Acute encephalopathy: Likely delirium and possibly iatrogenic.  No history of dementia.  Encephalopathy workup including basic labs UDS, VBG, UA, TSH, B12 and RPR unrevealing.  Ammonia 41.  CT head after fall on 4/2 without acute finding.  - Encephalopathy improved.  She is currently awake and oriented x 4.  No focal neuro deficit. Continue delirium precautions.    Essential hypertension: Elevated BP.  - Increased norvasc - Continue pain control   Anxiety and bipolar disorder -Continue home lamictal, duloxetine.  -Decreased on gabapentin.   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    History of CVA/hyperlipidemia - Continue home statin, not on antiplatelet PTA.   Tobacco use disorder: Reports smoking about half a pack a day. - Encouraged smoking cessation.  Not interested in nicotine patch   Hypokalemia: Resolved with supplementation.   Tyrone Nine, MD [facility] [email or web address] 08/10/2023, 3:01 PM

## 2023-08-10 NOTE — Care Management Obs Status (Signed)
 MEDICARE OBSERVATION STATUS NOTIFICATION   Patient Details  Name: Sydney Evans MRN: [id number] Date of Birth: [birth year]   Medicare Observation Status Notification Given:  No (patient and family refused to accept code 72 observation status)    Ronny Bacon, RN 08/10/2023, 9:28 AM

## 2023-08-10 NOTE — NC FL2 (Signed)
 [place] MEDICAID FL2 LEVEL OF CARE FORM     IDENTIFICATION  Patient Name: Sydney Evans Birthdate: 04/21/1951 Sex: female Admission Date (Current Location): 08/08/2023  [facility] [place] Number:  Producer, television/film/video and Address:  [facility] [place], 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) 06/25/2023   Right arm pain 06/25/2023   Abnormal metabolism 06/25/2023   Perineal abscess 04/29/2023   Urinary urgency 03/27/2023   Decreased GFR 03/27/2023   Hyperglycemia 03/26/2023   B12 deficiency 12/12/2022   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   Recurrent boils 09/05/2022   Acute diarrhea 08/07/2022   Herpes simplex vulvovaginitis 06/11/2022   Weakness 05/21/2022   PAD (peripheral artery disease) (HCC) 03/26/2022   Anterolisthesis of cervical spine 11/13/2021   Facet arthropathy, cervical 11/13/2021   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/04/2021   History of cervical cancer 09/03/2021   History of uterine cancer 09/03/2021   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   Critical limb ischemia of right lower extremity (HCC) 08/20/2021   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   Open-angle glaucoma 08/01/2021   Age-related osteoporosis without current pathological fracture 08/01/2021   Vitamin D deficiency 08/01/2021   Acquired absence of right  lower extremity below knee (HCC) 08/01/2021   Limitation of activity due to disability 08/01/2021   Tobacco use disorder 07/04/2021   Duodenal nodule 03/17/2020   Bipolar depression (HCC) 11/17/2019   Anxiety 11/17/2019   Generalized abdominal pain    NSVT (nonsustained ventricular tachycardia) (HCC)    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   Iron deficiency anemia 10/05/2017   Carotid stenosis 11/17/2016   GERD (gastroesophageal reflux disease) 11/17/2016   Essential hypertension 11/17/2016   Hyperlipidemia 11/17/2016   History of TIAs 07/17/2015   Localized, 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    Orientation RESPIRATION BLADDER Height & Weight     Self, Time, Situation, Place  Normal External catheter Weight: 147 lb 11.3 oz (67 kg) Height:  5\' 1"  (154.9 cm)  BEHAVIORAL SYMPTOMS/MOOD NEUROLOGICAL BOWEL NUTRITION STATUS      Continent Diet  AMBULATORY STATUS COMMUNICATION OF NEEDS Skin   Extensive Assist Verbally Surgical wounds                       Personal Care Assistance Level of Assistance  Bathing, Feeding, Dressing Bathing Assistance: Maximum assistance Feeding assistance: Limited assistance Dressing Assistance: Maximum assistance     Functional Limitations Info  Sight, Hearing, Speech Sight Info: Adequate Hearing Info: Adequate Speech Info: Adequate    SPECIAL CARE FACTORS FREQUENCY  PT (By licensed PT), OT (By licensed OT)     PT Frequency: 5x a week OT Frequency: 5x a week            Contractures Contractures Info: Not present    Additional Factors Info  Code Status, Allergies Code Status Info: fall Allergies Info: Hydroxyzine  Bactrim (Sulfamethoxazole-trimethoprim)  Dilaudid (Hydromorphone)  Ketorolac  Toradol (Ketorolac Tromethamine)  Tramadol  Zofran (Ondansetron)           Current Medications (08/10/2023):  This is the current hospital active medication list Current Facility-Administered Medications   Medication Dose Route Frequency Provider Last Rate Last Admin   acetaminophen (TYLENOL) tablet 1,000 mg  1,000 mg Oral Q6H WA Alanda Slim, Taye T, MD   1,000 mg at 08/10/23 0749   albuterol (VENTOLIN HFA) 108 (90 Base) MCG/ACT inhaler 2 puff  2 puff Inhalation Q6H PRN Gonfa, Taye T, MD       amLODipine (NORVASC) tablet 10 mg  10 mg Oral Daily Gonfa, Taye T, MD   10 mg at 08/10/23 1230   DULoxetine (CYMBALTA) DR capsule 60 mg  60 mg Oral Daily Gonfa, Taye T, MD   60 mg at 08/10/23 0749   enoxaparin (LOVENOX) injection 40 mg  40 mg Subcutaneous Q24H Gonfa, Taye T, MD   40 mg at 08/10/23 0750   fentaNYL (SUBLIMAZE) injection 25 mcg  25 mcg Intravenous Q3H PRN Candelaria Stagers T, MD       gabapentin (NEURONTIN) capsule 200 mg  200 mg Oral BID Candelaria Stagers T, MD   200 mg at 08/10/23 0749   lactulose (CHRONULAC) 10 GM/15ML solution 20 g  20 g Oral BID PRN Almon Hercules, MD       lamoTRIgine (LAMICTAL) tablet 200 mg  200 mg Oral Daily Gonfa, Taye T, MD   200 mg at 08/10/23 0749   latanoprost (XALATAN) 0.005 % ophthalmic solution 1 drop  1 drop Both Eyes QHS Candelaria Stagers T, MD   1 drop at 08/09/23 2126   Oral care mouth rinse  15 mL Mouth Rinse PRN Gonfa, Taye T, MD       oxyCODONE (Oxy IR/ROXICODONE) immediate release tablet 5 mg  5 mg Oral Q8H PRN Candelaria Stagers T, MD   5 mg at 08/09/23 2125   pantoprazole (PROTONIX) EC tablet 40 mg  40 mg Oral BID Candelaria Stagers T, MD   40 mg at 08/10/23 0749   polyvinyl alcohol (LIQUIFILM TEARS) 1.4 % ophthalmic solution 2 drop  2 drop Both Eyes Daily PRN Gonfa, Taye T, MD       senna-docusate (Senokot-S) tablet 1 tablet  1 tablet Oral BID PRN Almon Hercules, MD         Discharge Medications: Please see discharge summary for a list of discharge medications.  Relevant Imaging Results:  Relevant Lab Results:   Additional Information SSN: [id number]  Jimmy Picket, LCSW

## 2023-08-10 NOTE — TOC Progression Note (Signed)
 Transition of Care [facility]) - Progression Note    Patient Details  Name: Sydney Evans MRN: [id number] Date of Birth: [birth year]  [facility] CM/SW Contact  Ronny Bacon, RN Phone Number: 08/10/2023, 9:41 AM  Clinical Narrative:   Per utilization review, patient changed from inpatient status to observation last night. Attempt made to deliver Code 44 to patient this morning. Family at bedside became verbally abusive, "If she gets discharged there will be fucking problems!" "She fell and broke her right arm, she lives alone and has no use of her right leg. So how is she suppose to care for herself!" "She was already discharged from the hospital when this happened and ended up right back here!"  Attempted to explain that patient is not being discharged at this time just that she doesn't meet inpatient status. Provider at bedside reports that patient will remain in hospital until surgery on Tuesday.  Explained to patient and family that PT recommendations are for SNF facility. Patient had insurance agent on phone who will work on the situation.  Utilization Review made aware, reports regarding family and patient challenging there is nothing UR or TOC can do and that they will monitor situation while patient remains hospitalized. Patient and family refuse signing/receiving MOON/ CODE 68 letter.         Expected Discharge Plan and Services                                               Social Determinants of Health (SDOH) Interventions SDOH Screenings   Food Insecurity: No Food Insecurity (08/08/2023)  Housing: Patient Unable To Answer (08/08/2023)  Transportation Needs: Patient Unable To Answer (08/08/2023)  Utilities: Patient Unable To Answer (08/08/2023)  Alcohol Screen: Low Risk  (09/09/2022)  Depression (PHQ2-9): Low Risk  (12/24/2022)  Financial Resource Strain: Low Risk  (02/15/2020)  Physical Activity: Insufficiently Active (11/26/2021)  Social Connections: Patient  Unable To Answer (08/08/2023)  Stress: Stress Concern Present (02/15/2020)  Tobacco Use: High Risk (08/08/2023)    Readmission Risk Interventions     No data to display

## 2023-08-11 DIAGNOSIS — S42301D Unspecified fracture of shaft of humerus, right arm, subsequent encounter for fracture with routine healing: Secondary | ICD-10-CM | POA: Diagnosis not present

## 2023-08-11 MED ORDER — TRANEXAMIC ACID-NACL 1000-0.7 MG/100ML-% IV SOLN
1000.0000 mg | INTRAVENOUS | Status: AC
  Administered 2023-08-12: 1000 mg via INTRAVENOUS
  Filled 2023-08-11: qty 100

## 2023-08-11 MED ORDER — CEFAZOLIN SODIUM-DEXTROSE 2-4 GM/100ML-% IV SOLN
2.0000 g | INTRAVENOUS | Status: AC
  Administered 2023-08-12: 2 g via INTRAVENOUS
  Filled 2023-08-11: qty 100

## 2023-08-11 MED ORDER — POVIDONE-IODINE 10 % EX SWAB
2.0000 | Freq: Once | CUTANEOUS | Status: AC
  Administered 2023-08-12: 2 via TOPICAL

## 2023-08-11 NOTE — Hospital Course (Addendum)
  Brief Narrative: 73 year old F with PMH of CVA, right BKA, HTN, anxiety, bipolar disorder, tobacco use disorder, hyperlipidemia, right shoulder replacement and GERD sent to ED from orthopedics office due to incoherence and inability to care for herself.  Patient fell off wheelchair and seen in ED on 4/2. X-ray showed comminuted right humerus fracture, and she was discharged home from ED with a splint and sling for outpatient follow-up with Ortho. At outpatient follow up with Dr. Eulah Pont, she was noted to be unable to care for herself adequately at home, so was sent to the ED with plan for ORIF on 4/8.  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 Fall precaution, PT OT  Right lower lip and right neck skin laceration-right neck skin laceration repaired in ED 4/2. Healing  Right proximal humerus fracture:  -Pending surgery-stable  - Continue splint/sling NWB RUE, pt posted for ORIF by Dr. Renaye Rakers 4/8.  - Continue scheduled tylenol, gabapentin, as needed oxycodone and fentanyl - Lovenox for DVT prophylaxis - PT/OT eval > anticipate need for SNF postoperatively, will pursue placement.    Acute encephalopathy: Likely delirium and possibly iatrogenic.  No history of dementia.  Encephalopathy workup including basic labs UDS, VBG, UA, TSH, B12 and RPR unrevealing.  Ammonia 41.  CT head after fall on 4/2  without acute finding.  - Encephalopathy improved.  She is currently awake and oriented x 4.  No focal neuro deficit. Continue delirium precautions.    Essential hypertension:  -BP stabilized - Norvasc increased on 08/10/2023    Anxiety and bipolar disorder -mentation at baseline, stable -Continue home lamictal, duloxetine.  -Decreased on gabapentin.   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  Stable    History of CVA/hyperlipidemia -Stable - Continue home statin, not on antiplatelet PTA.   Tobacco use disorder: Reports smoking about half a pack a day. - Encouraged smoking cessation.  Not interested in nicotine patch   Hypokalemia: Resolved with supplementation.  Monitoring   Severe generalized debility, with right BKA, recurrent frequent falls, no new right proximal humeral fracture Pending PT OT evaluation, anticipating SNF placement postop

## 2023-08-11 NOTE — Plan of Care (Signed)

## 2023-08-11 NOTE — Progress Notes (Signed)
 PROGRESS NOTE    Patient: Sydney Evans                            PCP: Excell Seltzer, MD                    DOB: 1950-05-21            DOA: 08/08/2023 RUE:[id number]             DOS: 08/11/2023, 12:03 PM   LOS: 0 days   Date of Service: The patient was seen and examined on 08/11/2023  Subjective:   The patient was seen and examined this morning.  Awake alert oriented x 3 Hemodynamically stable. No issues overnight .  Brief Narrative:    Brief Narrative: 73 year old F with PMH of CVA, right BKA, HTN, anxiety, bipolar disorder, tobacco use disorder, hyperlipidemia, right shoulder replacement and GERD sent to ED from orthopedics office due to incoherence and inability to care for herself.  Patient fell off wheelchair and seen in ED on 4/2. X-ray showed comminuted right humerus fracture, and she was discharged home from ED with a splint and sling for outpatient follow-up with Ortho. At outpatient follow up with Dr. Eulah Pont, she was noted to be unable to care for herself adequately at home, so was sent to the ED with plan for ORIF on 4/8.  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 Fall precaution, PT OT  Right lower lip and right neck skin laceration-right neck skin laceration repaired in ED 4/2. Healing  Right proximal humerus fracture:  -Pending surgery-stable  - Continue splint/sling  NWB RUE, pt posted for ORIF by Dr. Renaye Rakers 4/8.  - Continue scheduled tylenol, gabapentin, as needed oxycodone and fentanyl - Lovenox for DVT prophylaxis - PT/OT eval > anticipate need for SNF postoperatively, will pursue placement.    Acute encephalopathy: Likely delirium and possibly iatrogenic.  No history of dementia.  Encephalopathy workup including basic labs UDS, VBG, UA, TSH, B12 and RPR unrevealing.  Ammonia 41.  CT head after fall on 4/2 without acute finding.  - Encephalopathy improved.  She is currently awake and oriented x 4.  No focal neuro deficit. Continue delirium precautions.    Essential hypertension:  -BP stabilized - Norvasc increased on 08/10/2023    Anxiety and bipolar disorder -mentation at baseline, stable -Continue home lamictal, duloxetine.  -Decreased on gabapentin.   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  Stable    History of CVA/hyperlipidemia -Stable - Continue home statin, not on antiplatelet PTA.   Tobacco use disorder: Reports smoking about half a pack a day. - Encouraged smoking cessation.  Not interested in nicotine patch   Hypokalemia: Resolved with supplementation.  Monitoring   Severe generalized debility, with right BKA, recurrent frequent  falls, no new right proximal humeral fracture Pending PT OT evaluation, anticipating SNF placement postop     ----------------------------------------------------------------------------------------------------------------------------------------------- Nutritional status:  The patient's BMI is: Body mass index is 27.91 kg/m. I agree with the assessment and plan as outlined   ------------------------------------------------------------------------------------------------------------------------------------------------  DVT prophylaxis:  SCDs   Code Status:   Code Status: Limited: Do not attempt resuscitation (DNR) -DNR-LIMITED -Do Not Intubate/DNI   Family  Communication: No family member present at bedside--Advance care planning has been discussed.   Admission status:   Status is: Observation The patient remains OBS appropriate and will d/c before 2 midnights.   Disposition: From  - home             Planning for discharge in 1-2 days: to   Procedures:   PR OPTX PROX HUMERAL FX W/INT FIXJ RPR TUBEROSITY [23615] PR OPTX HUMERAL SHFT FX W/PLATE/SCREWS W/WOCERCLAGE [24515]   Antimicrobials:  Anti-infectives (From admission, onward)    None        Medication:   acetaminophen  1,000 mg Oral Q6H WA   amLODipine  10 mg Oral Daily   atorvastatin  20 mg Oral Daily   DULoxetine  60 mg Oral Daily   gabapentin  200 mg Oral BID   lamoTRIgine  200 mg Oral Daily   latanoprost  1 drop Both Eyes QHS   pantoprazole  40 mg Oral BID    albuterol, fentaNYL (SUBLIMAZE) injection, lactulose, mouth rinse, oxyCODONE, polyvinyl alcohol, senna-docusate   Objective:   Vitals:   08/10/23 2041 08/11/23 0453 08/11/23 0848 08/11/23 0945  BP: (!) 144/74 (!) 156/69 133/67 129/73  Pulse: (!) 54 (!) 57 (!) 54 (!) 54  Resp: 17 18 18 16   Temp: 98.5 F (36.9 C) (!) 97.5 F (36.4 C) 97.7 F (36.5 C) (!) 97.4 F (36.3 C)  TempSrc: Oral Oral Oral Oral  SpO2: 95% 93% 98% 95%  Weight:      Height:        Intake/Output Summary (Last 24 hours) at 08/11/2023 1203 Last data filed at 08/11/2023 0600 Gross per 24 hour  Intake 360 ml  Output 1275 ml  Net -915 ml   Filed Weights   08/08/23 1400  Weight: 67 kg     Physical examination:        General:  AAO x 3,  cooperative, no distress;   HEENT:  Normocephalic, PERRL, otherwise with in Normal limits   Neuro:  CNII-XII intact. , normal motor and sensation, reflexes intact   Lungs:   Clear to auscultation BL, Respirations unlabored,  No wheezes / crackles  Cardio:    S1/S2, RRR, No murmure, No Rubs or Gallops   Abdomen:  Soft, non-tender, bowel sounds active all four quadrants, no guarding or  peritoneal signs.  Muscular  skeletal:  Right arm immobilized in the sling, with hand edema- Range of motion limited due to pain and immobilization Limited exam -global generalized weaknesses - in bed, able to move all 3 extremities,   2+ pulses,  symmetric, No pitting edema  Skin:  Dry, warm to touch, negative for any Rashes,  Wounds: Please see nursing documentation          ------------------------------------------------------------------------------------------------------------------------------------------    LABs:     Latest Ref Rng & Units 08/09/2023    6:40 AM 08/08/2023    3:24 PM 07/10/2023    4:05 PM  CBC  WBC 4.0 - 10.5 K/uL 4.5  5.1  3.7   Hemoglobin 12.0 - 15.0 g/dL 11.3  12.0  13.9   Hematocrit 36.0 - 46.0 % 34.4  35.5  41.5   Platelets 150 - 400 K/uL 250  262  354.0       Latest Ref Rng & Units 08/10/2023    7:13 AM 08/09/2023    6:40 AM 08/08/2023    3:24 PM  CMP  Glucose 70 - 99 mg/dL 83  80  89   BUN 8 - 23 mg/dL <5  <5  5   Creatinine 0.44 - 1.00 mg/dL 0.98  1.19  1.47   Sodium 135 - 145 mmol/L 138  137  135   Potassium 3.5 - 5.1 mmol/L 3.5  3.2  3.6   Chloride 98 - 111 mmol/L 106  105  102   CO2 22 - 32 mmol/L 20  20  23    Calcium 8.9 - 10.3 mg/dL 8.5  8.6  9.3   Total Protein 6.5 - 8.1 g/dL   6.5   Total Bilirubin 0.0 - 1.2 mg/dL   1.0   Alkaline Phos 38 - 126 U/L   64   AST 15 - 41 U/L   22   ALT 0 - 44 U/L   14        Micro Results Recent Results (from the past 240 hours)  Surgical pcr screen     Status: None   Collection Time: 08/10/23  8:23 PM   Specimen: Nasal Mucosa; Nasal Swab  Result Value Ref Range Status   MRSA, PCR NEGATIVE NEGATIVE Final   Staphylococcus aureus NEGATIVE NEGATIVE Final    Comment: (NOTE) The Xpert SA Assay (FDA approved for NASAL specimens in patients 12 years of age and older), is one component of a comprehensive surveillance program. It is not intended to diagnose infection nor to guide or monitor  treatment. Performed at [facility], [address] [place], [place] [address]     Radiology Reports No results found.  SIGNED: Kendell Bane, MD, FHM. FAAFP. [facility] - [facility] Time spent - 35 min.  In seeing, evaluating and examining the patient. Reviewing medical records, labs, drawn plan of care. [facility],  Pager (please use [email or web address] to page/ text) Please use Epic Secure Chat for non-urgent communication (7AM-7PM)  If 7PM-7AM, please contact night-coverage [email or web address], 08/11/2023, 12:03 PM

## 2023-08-11 NOTE — TOC Progression Note (Signed)
 Transition of Care [facility]) - Progression Note    Patient Details  Name: Natane Heward MRN: [id number] Date of Birth: 06-07-1950  Transition of Care [facility]) CM/SW Contact  Mearl Latin, LCSW Phone Number: 08/11/2023, 8:41 AM  Clinical Narrative:    8:41 AM-No SNF bed offers at this time due to patient's insurance. [facility] has declined. CSW expanded search.    Expected Discharge Plan: Skilled Nursing Facility Barriers to Discharge: Continued Medical Work up  Expected Discharge Plan and Services       Living arrangements for the past 2 months: Single Family Home                                       Social Determinants of Health (SDOH) Interventions SDOH Screenings   Food Insecurity: No Food Insecurity (08/08/2023)  Housing: Patient Unable To Answer (08/08/2023)  Transportation Needs: Patient Unable To Answer (08/08/2023)  Utilities: Patient Unable To Answer (08/08/2023)  Alcohol Screen: Low Risk  (09/09/2022)  Depression (PHQ2-9): Low Risk  (12/24/2022)  Financial Resource Strain: Low Risk  (02/15/2020)  Physical Activity: Insufficiently Active (11/26/2021)  Social Connections: Patient Unable To Answer (08/08/2023)  Stress: Stress Concern Present (02/15/2020)  Tobacco Use: High Risk (08/08/2023)    Readmission Risk Interventions     No data to display

## 2023-08-12 ENCOUNTER — Observation Stay (HOSPITAL_COMMUNITY): Admitting: Anesthesiology

## 2023-08-12 ENCOUNTER — Observation Stay (HOSPITAL_COMMUNITY)

## 2023-08-12 ENCOUNTER — Encounter (HOSPITAL_COMMUNITY): Payer: Self-pay | Admitting: Student

## 2023-08-12 ENCOUNTER — Encounter (HOSPITAL_COMMUNITY)
Admission: EM | Disposition: A | Discharge status: Rehabilitation Facility | Payer: Self-pay | Source: Ambulatory Visit | Attending: Internal Medicine

## 2023-08-12 DIAGNOSIS — S42201A Unspecified fracture of upper end of right humerus, initial encounter for closed fracture: Secondary | ICD-10-CM | POA: Diagnosis not present

## 2023-08-12 DIAGNOSIS — S42301A Unspecified fracture of shaft of humerus, right arm, initial encounter for closed fracture: Secondary | ICD-10-CM | POA: Diagnosis not present

## 2023-08-12 DIAGNOSIS — S42351A Displaced comminuted fracture of shaft of humerus, right arm, initial encounter for closed fracture: Secondary | ICD-10-CM | POA: Diagnosis not present

## 2023-08-12 DIAGNOSIS — S42301D Unspecified fracture of shaft of humerus, right arm, subsequent encounter for fracture with routine healing: Secondary | ICD-10-CM | POA: Diagnosis not present

## 2023-08-12 DIAGNOSIS — S42291A Other displaced fracture of upper end of right humerus, initial encounter for closed fracture: Secondary | ICD-10-CM | POA: Diagnosis not present

## 2023-08-12 DIAGNOSIS — I10 Essential (primary) hypertension: Secondary | ICD-10-CM | POA: Diagnosis not present

## 2023-08-12 DIAGNOSIS — F418 Other specified anxiety disorders: Secondary | ICD-10-CM

## 2023-08-12 DIAGNOSIS — F17210 Nicotine dependence, cigarettes, uncomplicated: Secondary | ICD-10-CM | POA: Diagnosis not present

## 2023-08-12 DIAGNOSIS — G8918 Other acute postprocedural pain: Secondary | ICD-10-CM | POA: Diagnosis not present

## 2023-08-12 HISTORY — PX: ORIF HUMERUS FRACTURE: SHX2126

## 2023-08-12 LAB — BASIC METABOLIC PANEL WITH GFR
Anion gap: 10 (ref 5–15)
BUN: 10 mg/dL (ref 8–23)
CO2: 21 mmol/L — ABNORMAL LOW (ref 22–32)
Calcium: 8.5 mg/dL — ABNORMAL LOW (ref 8.9–10.3)
Chloride: 103 mmol/L (ref 98–111)
Creatinine, Ser: 0.67 mg/dL (ref 0.44–1.00)
GFR, Estimated: >60 mL/min (ref >60)
Glucose, Bld: 99 mg/dL (ref 70–99)
Potassium: 3.3 mmol/L — ABNORMAL LOW (ref 3.5–5.1)
Sodium: 134 mmol/L — ABNORMAL LOW (ref 135–145)

## 2023-08-12 LAB — NO BLOOD PRODUCTS

## 2023-08-12 LAB — CBC
HCT: 38.4 % (ref 36.0–46.0)
Hemoglobin: 12.7 g/dL (ref 12.0–15.0)
MCH: 29.1 pg (ref 26.0–34.0)
MCHC: 33.1 g/dL (ref 30.0–36.0)
MCV: 87.9 fL (ref 80.0–100.0)
Platelets: 324 10*3/uL (ref 150–400)
RBC: 4.37 MIL/uL (ref 3.87–5.11)
RDW: 14.1 % (ref 11.5–15.5)
WBC: 5.8 10*3/uL (ref 4.0–10.5)
nRBC: 0 % (ref 0.0–0.2)

## 2023-08-12 SURGERY — OPEN REDUCTION INTERNAL FIXATION (ORIF) PROXIMAL HUMERUS FRACTURE
Anesthesia: General | Site: Arm Upper | Laterality: Right

## 2023-08-12 MED ORDER — ROCURONIUM BROMIDE 10 MG/ML (PF) SYRINGE
PREFILLED_SYRINGE | INTRAVENOUS | Status: DC | PRN
  Administered 2023-08-12: 10 mg via INTRAVENOUS
  Administered 2023-08-12: 40 mg via INTRAVENOUS
  Administered 2023-08-12: 20 mg via INTRAVENOUS

## 2023-08-12 MED ORDER — TRANEXAMIC ACID-NACL 1000-0.7 MG/100ML-% IV SOLN
1000.0000 mg | Freq: Once | INTRAVENOUS | Status: AC
  Administered 2023-08-12: 1000 mg via INTRAVENOUS
  Filled 2023-08-12: qty 100

## 2023-08-12 MED ORDER — SUGAMMADEX SODIUM 200 MG/2ML IV SOLN
INTRAVENOUS | Status: DC | PRN
  Administered 2023-08-12: 200 mg via INTRAVENOUS

## 2023-08-12 MED ORDER — ORAL CARE MOUTH RINSE: 15.0000 mL | Freq: Once | OROMUCOSAL | Status: AC

## 2023-08-12 MED ORDER — METOCLOPRAMIDE HCL 5 MG/ML IJ SOLN: 5.0000 mg | Freq: Three times a day (TID) | INTRAMUSCULAR | Status: DC | PRN

## 2023-08-12 MED ORDER — BUPIVACAINE-EPINEPHRINE (PF) 0.5% -1:200000 IJ SOLN
INTRAMUSCULAR | Status: AC
  Filled 2023-08-12: qty 30

## 2023-08-12 MED ORDER — ACETAMINOPHEN 325 MG PO TABS: 325.0000 mg | ORAL_TABLET | Freq: Four times a day (QID) | ORAL | Status: DC | PRN

## 2023-08-12 MED ORDER — CEFAZOLIN SODIUM-DEXTROSE 2-4 GM/100ML-% IV SOLN
2.0000 g | Freq: Four times a day (QID) | INTRAVENOUS | Status: AC
  Administered 2023-08-12 (×2): 2 g via INTRAVENOUS
  Filled 2023-08-12 (×2): qty 100

## 2023-08-12 MED ORDER — BUPIVACAINE-EPINEPHRINE (PF) 0.5% -1:200000 IJ SOLN
INTRAMUSCULAR | Status: DC | PRN
Start: 2023-08-12 — End: 2023-08-12

## 2023-08-12 MED ORDER — ROCURONIUM BROMIDE 10 MG/ML (PF) SYRINGE
PREFILLED_SYRINGE | INTRAVENOUS | Status: AC
  Filled 2023-08-12: qty 10

## 2023-08-12 MED ORDER — FENTANYL CITRATE PF 50 MCG/ML IJ SOSY
12.5000 ug | PREFILLED_SYRINGE | INTRAMUSCULAR | Status: DC | PRN
  Administered 2023-08-13: 12.5 ug via INTRAVENOUS
  Filled 2023-08-12: qty 1

## 2023-08-12 MED ORDER — OXYCODONE HCL 5 MG PO TABS
5.0000 mg | ORAL_TABLET | ORAL | Status: DC | PRN
  Administered 2023-08-12: 5 mg via ORAL
  Filled 2023-08-12: qty 1

## 2023-08-12 MED ORDER — DEXAMETHASONE SODIUM PHOSPHATE 10 MG/ML IJ SOLN
INTRAMUSCULAR | Status: DC | PRN
  Administered 2023-08-12: 10 mg via INTRAVENOUS

## 2023-08-12 MED ORDER — FENTANYL CITRATE (PF) 250 MCG/5ML IJ SOLN
INTRAMUSCULAR | Status: AC
  Filled 2023-08-12: qty 5

## 2023-08-12 MED ORDER — CHLORHEXIDINE GLUCONATE 0.12 % MT SOLN: 15.0000 mL | Freq: Once | OROMUCOSAL | Status: AC

## 2023-08-12 MED ORDER — ENOXAPARIN SODIUM 40 MG/0.4ML IJ SOSY
40.0000 mg | PREFILLED_SYRINGE | INTRAMUSCULAR | Status: DC
  Administered 2023-08-13 – 2023-08-15 (×3): 40 mg via SUBCUTANEOUS
  Filled 2023-08-12 (×3): qty 0.4

## 2023-08-12 MED ORDER — HYDRALAZINE HCL 20 MG/ML IJ SOLN: 10.0000 mg | INTRAMUSCULAR | Status: DC | PRN

## 2023-08-12 MED ORDER — POTASSIUM CHLORIDE 10 MEQ/100ML IV SOLN
10.0000 meq | INTRAVENOUS | Status: AC
  Administered 2023-08-12 (×3): 10 meq via INTRAVENOUS
  Filled 2023-08-12: qty 100

## 2023-08-12 MED ORDER — LACTATED RINGERS IV SOLN: INTRAVENOUS | Status: DC

## 2023-08-12 MED ORDER — MENTHOL 3 MG MT LOZG: 1.0000 | LOZENGE | OROMUCOSAL | Status: DC | PRN

## 2023-08-12 MED ORDER — METHOCARBAMOL 500 MG PO TABS
500.0000 mg | ORAL_TABLET | Freq: Four times a day (QID) | ORAL | Status: DC | PRN
  Administered 2023-08-13 – 2023-08-14 (×2): 500 mg via ORAL
  Filled 2023-08-12 (×2): qty 1

## 2023-08-12 MED ORDER — LIDOCAINE 2% (20 MG/ML) 5 ML SYRINGE
INTRAMUSCULAR | Status: AC
  Filled 2023-08-12: qty 5

## 2023-08-12 MED ORDER — FENTANYL CITRATE (PF) 100 MCG/2ML IJ SOLN: 25.0000 ug | INTRAMUSCULAR | Status: DC | PRN

## 2023-08-12 MED ORDER — PROPOFOL 10 MG/ML IV BOLUS
INTRAVENOUS | Status: DC | PRN
  Administered 2023-08-12: 80 mg via INTRAVENOUS
  Administered 2023-08-12: 20 mg via INTRAVENOUS

## 2023-08-12 MED ORDER — PHENYLEPHRINE HCL-NACL 20-0.9 MG/250ML-% IV SOLN
INTRAVENOUS | Status: DC | PRN
  Administered 2023-08-12: 30 ug/min via INTRAVENOUS

## 2023-08-12 MED ORDER — PROPOFOL 10 MG/ML IV BOLUS
INTRAVENOUS | Status: AC
  Filled 2023-08-12: qty 20

## 2023-08-12 MED ORDER — DIPHENHYDRAMINE HCL 12.5 MG/5ML PO ELIX: 12.5000 mg | ORAL_SOLUTION | ORAL | Status: DC | PRN

## 2023-08-12 MED ORDER — METOCLOPRAMIDE HCL 5 MG PO TABS: 5.0000 mg | ORAL_TABLET | Freq: Three times a day (TID) | ORAL | Status: DC | PRN

## 2023-08-12 MED ORDER — BISACODYL 10 MG RE SUPP: 10.0000 mg | Freq: Every day | RECTAL | Status: DC | PRN

## 2023-08-12 MED ORDER — EPHEDRINE 5 MG/ML INJ
INTRAVENOUS | Status: AC
  Filled 2023-08-12: qty 5

## 2023-08-12 MED ORDER — PHENYLEPHRINE 80 MCG/ML (10ML) SYRINGE FOR IV PUSH (FOR BLOOD PRESSURE SUPPORT)
PREFILLED_SYRINGE | INTRAVENOUS | Status: AC
  Filled 2023-08-12: qty 10

## 2023-08-12 MED ORDER — METOPROLOL TARTRATE 5 MG/5ML IV SOLN: 5.0000 mg | INTRAVENOUS | Status: DC | PRN

## 2023-08-12 MED ORDER — CHLORHEXIDINE GLUCONATE 0.12 % MT SOLN
OROMUCOSAL | Status: AC
Start: 2023-08-12 — End: 2023-08-12
  Administered 2023-08-12: 15 mL via OROMUCOSAL
  Filled 2023-08-12: qty 15

## 2023-08-12 MED ORDER — 0.9 % SODIUM CHLORIDE (POUR BTL) OPTIME
TOPICAL | Status: DC | PRN
  Administered 2023-08-12: 1000 mL

## 2023-08-12 MED ORDER — ONDANSETRON HCL 4 MG/2ML IJ SOLN
4.0000 mg | Freq: Four times a day (QID) | INTRAMUSCULAR | Status: DC | PRN
  Administered 2023-08-12 – 2023-08-14 (×2): 4 mg via INTRAVENOUS
  Filled 2023-08-12 (×2): qty 2

## 2023-08-12 MED ORDER — EPHEDRINE SULFATE-NACL 50-0.9 MG/10ML-% IV SOSY
PREFILLED_SYRINGE | INTRAVENOUS | Status: DC | PRN
  Administered 2023-08-12 (×2): 10 mg via INTRAVENOUS
  Administered 2023-08-12: 5 mg via INTRAVENOUS
  Administered 2023-08-12 (×2): 10 mg via INTRAVENOUS
  Administered 2023-08-12: 5 mg via INTRAVENOUS

## 2023-08-12 MED ORDER — DOCUSATE SODIUM 100 MG PO CAPS
100.0000 mg | ORAL_CAPSULE | Freq: Two times a day (BID) | ORAL | Status: DC
  Administered 2023-08-12 – 2023-08-15 (×5): 100 mg via ORAL
  Filled 2023-08-12 (×6): qty 1

## 2023-08-12 MED ORDER — PHENYLEPHRINE 80 MCG/ML (10ML) SYRINGE FOR IV PUSH (FOR BLOOD PRESSURE SUPPORT)
PREFILLED_SYRINGE | INTRAVENOUS | Status: DC | PRN
  Administered 2023-08-12 (×3): 80 ug via INTRAVENOUS
  Administered 2023-08-12: 160 ug via INTRAVENOUS

## 2023-08-12 MED ORDER — BUPIVACAINE LIPOSOME 1.3 % IJ SUSP
INTRAMUSCULAR | Status: DC | PRN
  Administered 2023-08-12: 10 mL via PERINEURAL

## 2023-08-12 MED ORDER — ALUM & MAG HYDROXIDE-SIMETH 200-200-20 MG/5ML PO SUSP: 30.0000 mL | ORAL | Status: DC | PRN

## 2023-08-12 MED ORDER — FENTANYL CITRATE (PF) 250 MCG/5ML IJ SOLN
INTRAMUSCULAR | Status: DC | PRN
  Administered 2023-08-12 (×3): 50 ug via INTRAVENOUS
  Administered 2023-08-12 (×2): 25 ug via INTRAVENOUS
  Administered 2023-08-12: 50 ug via INTRAVENOUS

## 2023-08-12 MED ORDER — PHENOL 1.4 % MT LIQD: 1.0000 | OROMUCOSAL | Status: DC | PRN

## 2023-08-12 MED ORDER — BUPIVACAINE-EPINEPHRINE (PF) 0.5% -1:200000 IJ SOLN
INTRAMUSCULAR | Status: DC | PRN
  Administered 2023-08-12: 15 mL via PERINEURAL

## 2023-08-12 MED ORDER — BUPIVACAINE-EPINEPHRINE (PF) 0.25% -1:200000 IJ SOLN
INTRAMUSCULAR | Status: AC
  Filled 2023-08-12: qty 30

## 2023-08-12 MED ORDER — METHOCARBAMOL 1000 MG/10ML IJ SOLN
500.0000 mg | Freq: Four times a day (QID) | INTRAMUSCULAR | Status: DC | PRN
  Administered 2023-08-14: 500 mg via INTRAVENOUS
  Filled 2023-08-12: qty 10

## 2023-08-12 MED ORDER — OXYCODONE HCL 5 MG PO TABS
10.0000 mg | ORAL_TABLET | ORAL | Status: DC | PRN
  Administered 2023-08-13 – 2023-08-14 (×4): 10 mg via ORAL
  Filled 2023-08-12 (×4): qty 2

## 2023-08-12 SURGICAL SUPPLY — 63 items
BAG COUNTER SPONGE SURGICOUNT (BAG) ×2 IMPLANT
BENZOIN TINCTURE PRP APPL 2/3 (GAUZE/BANDAGES/DRESSINGS) ×1 IMPLANT
BIT DRILL 3.2XCALB NS DISP (BIT) ×1 IMPLANT
BIT DRILL CALIBRATED 2.7 (BIT) ×2 IMPLANT
BIT DRL 3.2XCALB NS DISP (BIT) ×2 IMPLANT
COVER SURGICAL LIGHT HANDLE (MISCELLANEOUS) ×2 IMPLANT
DRAPE C-ARM 42X72 X-RAY (DRAPES) ×2 IMPLANT
DRAPE IMP U-DRAPE 54X76 (DRAPES) ×2 IMPLANT
DRAPE INCISE IOBAN 66X45 STRL (DRAPES) ×2 IMPLANT
DRAPE U-SHAPE 47X51 STRL (DRAPES) ×2 IMPLANT
DRSG AQUACEL AG ADV 3.5X14 (GAUZE/BANDAGES/DRESSINGS) ×1 IMPLANT
DURAPREP 26ML APPLICATOR (WOUND CARE) ×2 IMPLANT
ELECT REM PT RETURN 9FT ADLT (ELECTROSURGICAL) IMPLANT
ELECTRODE REM PT RTRN 9FT ADLT (ELECTROSURGICAL) IMPLANT
GAUZE SPONGE 4X4 12PLY STRL (GAUZE/BANDAGES/DRESSINGS) ×2 IMPLANT
GAUZE XEROFORM 1X8 LF (GAUZE/BANDAGES/DRESSINGS) ×1 IMPLANT
GLOVE BIO SURGEON STRL SZ7.5 (GLOVE) ×2 IMPLANT
GLOVE BIOGEL PI IND STRL 7.5 (GLOVE) ×2 IMPLANT
GLOVE BIOGEL PI IND STRL 8 (GLOVE) ×2 IMPLANT
GLOVE SURG SYN 7.5 E (GLOVE) ×2 IMPLANT
GLOVE SURG SYN 7.5 PF PI (GLOVE) ×1 IMPLANT
GOWN STRL REUS W/ TWL LRG LVL3 (GOWN DISPOSABLE) ×4 IMPLANT
GOWN STRL REUS W/ TWL XL LVL3 (GOWN DISPOSABLE) ×4 IMPLANT
K-WIRE 2X5 SS THRDED S3 (WIRE) ×6 IMPLANT
KIT BASIN OR (CUSTOM PROCEDURE TRAY) ×2 IMPLANT
KIT TURNOVER KIT B (KITS) ×2 IMPLANT
KWIRE 2X5 SS THRDED S3 (WIRE) ×3 IMPLANT
MANIFOLD NEPTUNE II (INSTRUMENTS) ×2 IMPLANT
NDL HYPO 25GX1X1/2 BEV (NEEDLE) IMPLANT
NEEDLE HYPO 25GX1X1/2 BEV (NEEDLE) IMPLANT
NS IRRIG 1000ML POUR BTL (IV SOLUTION) ×2 IMPLANT
PACK SHOULDER (CUSTOM PROCEDURE TRAY) ×2 IMPLANT
PACK UNIVERSAL I (CUSTOM PROCEDURE TRAY) ×2 IMPLANT
PAD ARMBOARD POSITIONER FOAM (MISCELLANEOUS) ×4 IMPLANT
PLATE PHP 11 H LOW RT (Plate) ×1 IMPLANT
RESTRAINT HEAD UNIVERSAL NS (MISCELLANEOUS) ×2 IMPLANT
SCREW CORTICAL LOW PROF 3.5X34 (Screw) ×2 IMPLANT
SCREW LOCK CORT STAR 3.5X20 (Screw) ×3 IMPLANT
SCREW LOCK CORT STAR 3.5X24 (Screw) ×1 IMPLANT
SCREW LOCK CORT STAR 3.5X30 (Screw) ×1 IMPLANT
SCREW LOW PROFILE 3.5X30MM TIS (Screw) ×1 IMPLANT
SCREW LP NL T15 3.5X22 (Screw) ×2 IMPLANT
SCREW LP NL T15 3.5X24 (Screw) ×2 IMPLANT
SCREW T15 LP CORT 3.5X36MM NS (Screw) ×1 IMPLANT
SLING ARM FOAM STRAP LRG (SOFTGOODS) ×1 IMPLANT
SPONGE T-LAP 4X18 ~~LOC~~+RFID (SPONGE) IMPLANT
STAPLER VISISTAT 35W (STAPLE) IMPLANT
STRIP CLOSURE SKIN 1/2X4 (GAUZE/BANDAGES/DRESSINGS) ×3 IMPLANT
SUCTION TUBE FRAZIER 10FR DISP (SUCTIONS) ×2 IMPLANT
SUPPORT WRAP ARM LG (MISCELLANEOUS) ×2 IMPLANT
SUT ETHIBOND NAB CT1 #1 30IN (SUTURE) ×1 IMPLANT
SUT FIBERWIRE #2 38 T-5 BLUE (SUTURE) IMPLANT
SUT MNCRL AB 4-0 PS2 18 (SUTURE) ×1 IMPLANT
SUT MON AB 2-0 CT1 27 (SUTURE) ×2 IMPLANT
SUT VIC AB 0 CT1 27XBRD ANBCTR (SUTURE) ×1 IMPLANT
SUT VIC AB 2-0 CT1 TAPERPNT 27 (SUTURE) ×2 IMPLANT
SUTURE FIBERWR #2 38 T-5 BLUE (SUTURE) IMPLANT
SYR BULB IRRIG 60ML STRL (SYRINGE) ×2 IMPLANT
SYR CONTROL 10ML LL (SYRINGE) IMPLANT
TOWEL GREEN STERILE (TOWEL DISPOSABLE) ×2 IMPLANT
TOWEL GREEN STERILE FF (TOWEL DISPOSABLE) ×2 IMPLANT
TOWEL OR NON WOVEN STRL DISP B (DISPOSABLE) ×2 IMPLANT
WATER STERILE IRR 1000ML POUR (IV SOLUTION) ×2 IMPLANT

## 2023-08-12 NOTE — Anesthesia Postprocedure Evaluation (Signed)
 Anesthesia Post Note  Patient: Sydney Evans  Procedure(s) Performed: OPEN REDUCTION INTERNAL FIXATION (ORIF) PROXIMAL HUMERUS FRACTURE (Right: Arm Upper)     Patient location during evaluation: PACU Anesthesia Type: General and Regional Level of consciousness: awake and alert Pain management: pain level controlled Vital Signs Assessment: post-procedure vital signs reviewed and stable Respiratory status: spontaneous breathing, nonlabored ventilation, respiratory function stable and patient connected to nasal cannula oxygen Cardiovascular status: blood pressure returned to baseline and stable Postop Assessment: no apparent nausea or vomiting Anesthetic complications: no   No notable events documented.  Last Vitals:  Vitals:   08/12/23 1500 08/12/23 1513  BP: 123/80 122/72  Pulse: 83 77  Resp: 14 18  Temp: 36.5 C 36.9 C  SpO2: 92% 98%    Last Pain:  Vitals:   08/12/23 1518  TempSrc:   PainSc: 0-No pain                 Caitlinn Klinker,W. EDMOND

## 2023-08-12 NOTE — Discharge Instructions (Signed)

## 2023-08-12 NOTE — Transfer of Care (Signed)
 Immediate Anesthesia Transfer of Care Note  Patient: Sydney Evans  Procedure(s) Performed: OPEN REDUCTION INTERNAL FIXATION (ORIF) PROXIMAL HUMERUS FRACTURE (Right: Arm Upper)  Patient Location: PACU  Anesthesia Type:GA combined with regional for post-op pain  Level of Consciousness: drowsy  Airway & Oxygen Therapy: Patient Spontanous Breathing and Patient connected to nasal cannula oxygen  Post-op Assessment: Report given to RN and Post -op Vital signs reviewed and stable  Post vital signs: Reviewed and stable  Last Vitals:  Vitals Value Taken Time  BP 119/59 08/12/23 1337  Temp 36.4 C 08/12/23 1337  Pulse 71 08/12/23 1339  Resp 13 08/12/23 1339  SpO2 97 % 08/12/23 1339  Vitals shown include unfiled device data.  Last Pain:  Vitals:   08/12/23 0935  TempSrc:   PainSc: 8       Patients Stated Pain Goal: 0 (08/10/23 2118)  Complications: No notable events documented.

## 2023-08-12 NOTE — Progress Notes (Signed)
 PT Cancellation Note  Patient Details Name: Sydney Evans MRN: [id number] DOB: February 06, 1951   Cancelled Treatment:    Reason Eval/Treat Not Completed: Patient at procedure or test/unavailable (surgery). Will await new orders to restart PT.   Lyanne Co, PT  Acute Rehab Services Secure chat preferred Office [phone]    Elyse Hsu 08/12/2023, 2:01 PM

## 2023-08-12 NOTE — Interval H&P Note (Signed)
 History and Physical Interval Note:  08/12/2023 8:10 AM  Sydney Evans  has presented today for surgery, with the diagnosis of RIGHT PROXIMAL AND SHAFT HUMERUS FRACTURES.  The various methods of treatment have been discussed with the patient and family. After consideration of risks, benefits and other options for treatment, the patient has consented to  Procedure(s): OPEN REDUCTION INTERNAL FIXATION (ORIF) PROXIMAL HUMERUS FRACTURE (Right) OPEN REDUCTION INTERNAL FIXATION (ORIF) HUMERAL SHAFT FRACTURE (Right) as a surgical intervention.  The patient's history has been reviewed, patient examined, no change in status, stable for surgery.  I have reviewed the patient's chart and labs.  Questions were answered to the patient's satisfaction.     Sheral Apley

## 2023-08-12 NOTE — Progress Notes (Signed)
 OT Cancellation Note  Patient Details Name: Sydney Evans MRN: [id number] DOB: [birth year]   Cancelled Treatment:    Reason Eval/Treat Not Completed: Medical issues which prohibited therapy.  Patient s/p ORIF to shoulder this date.  OT to evaluate 4/9 to assist with discharge recommendations.  Kinston Magnan D Anelly Samarin 08/12/2023, 3:35 PM 08/12/2023  RP, OTR/L  Acute Rehabilitation Services  Office:  [phone]

## 2023-08-12 NOTE — Anesthesia Procedure Notes (Signed)
 Procedure Name: Intubation Date/Time: 08/12/2023 10:44 AM  Performed by: Colbert Coyer, CRNAPre-anesthesia Checklist: Patient identified, Emergency Drugs available, Suction available and Patient being monitored Patient Re-evaluated:Patient Re-evaluated prior to induction Oxygen Delivery Method: Circle System Utilized Preoxygenation: Pre-oxygenation with 100% oxygen Induction Type: IV induction Ventilation: Mask ventilation without difficulty Laryngoscope Size: Mac and 4 Tube type: Oral Tube size: 7.0 mm Number of attempts: 1 Airway Equipment and Method: Stylet Placement Confirmation: ETT inserted through vocal cords under direct vision, positive ETCO2 and breath sounds checked- equal and bilateral Secured at: 21 cm Tube secured with: Tape Dental Injury: Teeth and Oropharynx as per pre-operative assessment

## 2023-08-12 NOTE — Anesthesia Preprocedure Evaluation (Addendum)
 Anesthesia Evaluation  Patient identified by MRN, date of birth, ID band Patient awake    Reviewed: Allergy & Precautions, H&P , NPO status , Patient's Chart, lab work & pertinent test results  Airway Mallampati: II  TM Distance: >3 FB Neck ROM: Full    Dental no notable dental hx. (+) Edentulous Upper, Edentulous Lower, Dental Advisory Given   Pulmonary Current Smoker and Patient abstained from smoking.   Pulmonary exam normal breath sounds clear to auscultation       Cardiovascular hypertension, Pt. on medications + Peripheral Vascular Disease   Rhythm:Regular Rate:Normal     Neuro/Psych  Headaches  Anxiety Depression Bipolar Disorder   TIACVA    GI/Hepatic Neg liver ROS,GERD  Medicated,,  Endo/Other  negative endocrine ROS    Renal/GU negative Renal ROS  negative genitourinary   Musculoskeletal  (+) Arthritis , Osteoarthritis,    Abdominal   Peds  Hematology  (+) Blood dyscrasia, anemia   Anesthesia Other Findings   Reproductive/Obstetrics negative OB ROS                             Anesthesia Physical Anesthesia Plan  ASA: 3  Anesthesia Plan: General   Post-op Pain Management: Tylenol PO (pre-op)* and Regional block*   Induction: Intravenous  PONV Risk Score and Plan: 2 and Ondansetron, Dexamethasone and Treatment may vary due to age or medical condition  Airway Management Planned: Oral ETT  Additional Equipment:   Intra-op Plan:   Post-operative Plan: Extubation in OR  Informed Consent: I have reviewed the patients History and Physical, chart, labs and discussed the procedure including the risks, benefits and alternatives for the proposed anesthesia with the patient or authorized representative who has indicated his/her understanding and acceptance.   Patient has DNR.  Discussed DNR with patient and Continue DNR.   Dental advisory given  Plan Discussed with:  CRNA  Anesthesia Plan Comments:        Anesthesia Quick Evaluation

## 2023-08-12 NOTE — Progress Notes (Signed)
 PROGRESS NOTE    Sydney Evans  WGN:[id number] DOB: Dec 18, 1950 DOA: 08/08/2023 PCP: Excell Seltzer, MD    Brief Narrative:   73-year-old F with PMH of CVA, right BKA, HTN, anxiety, bipolar disorder, tobacco use disorder, hyperlipidemia, right shoulder replacement and GERD sent to ED from orthopedics office due to incoherence and inability to care for herself. Patient fell off wheelchair and seen in ED on 4/2. X-ray showed comminuted right humerus fracture, and she was discharged home from ED with a splint and sling for outpatient follow-up with Ortho. At outpatient follow up with Dr. Eulah Pont, she was noted to be unable to care for herself adequately at home, so was sent to the ED with plan for ORIF on 4/8.   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 Fall precaution, PT OT   Right lower lip and right neck skin laceration-right neck skin laceration repaired in ED 4/2. Healing   Right proximal humerus fracture:  - Continue splint/sling NWB RUE, pt posted for ORIF by Dr. Renaye Rakers 4/8.  - Continue scheduled tylenol, gabapentin, as needed oxycodone and fentanyl - Lovenox for DVT prophylaxis - PT/OT eval > anticipate need for SNF postoperatively, will pursue placement.    Acute encephalopathy:  Likely delirium and possibly iatrogenic.  No history of dementia.  Encephalopathy workup including basic labs UDS, VBG, UA, TSH, B12 and RPR unrevealing.  Ammonia 41.  CT head after fall on 4/2 without acute finding. - Encephalopathy improved.  She is  currently awake and oriented x 4.  No focal neuro deficit. Continue delirium precautions.    Essential hypertension:  -BP stabilized - Norvasc increased on 08/10/2023     Anxiety and bipolar disorder -mentation at baseline, stable -Continue home lamictal, duloxetine.  -Decreased on gabapentin.   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  Stable     History of CVA/hyperlipidemia -Stable - Continue home statin, not on antiplatelet PTA.   Tobacco use disorder: Reports smoking about half a pack a day. - Encouraged smoking cessation.  Not interested in nicotine patch   Hypokalemia:  Prn repletion     Severe generalized debility, with right BKA, recurrent frequent falls, no new right proximal humeral fracture Pending PT OT evaluation, anticipating SNF placement postop  DVT prophylaxis: SCD    Code Status: Limited: Do not attempt resuscitation (DNR) -DNR-LIMITED -Do Not Intubate/DNI  Family Communication:   Surgical intervention per orthopedic today    Subjective: Seen post Op. No complaints. Still drwosy from anesthesia, RN at bedside with me   Examination:  General exam: drowsy post anesthesia Respiratory system: Clear to auscultation. Respiratory effort normal. Cardiovascular system: S1 & S2 heard, RRR. No JVD, murmurs, rubs, gallops or clicks. No pedal edema. Gastrointestinal system: Abdomen is nondistended, soft and nontender. No organomegaly or masses felt. Normal bowel sounds heard. Central nervous system: Alert and oriented. No focal neurological deficits. Extremities: Symmetric 5 x 5 power. Skin: No rashes, lesions or ulcers Psychiatry: Judgement and  insight appear normal. Mood & affect appropriate. RUE dressing is in place.                Diet Orders (From admission, onward)     Start     Ordered   08/12/23 1519  Diet 2 gram sodium Room service appropriate? Yes; Fluid consistency: Thin  Diet effective now       Question  Answer Comment  Room service appropriate? Yes   Fluid consistency: Thin      08/12/23 1518            Objective: Vitals:   08/12/23 1430 08/12/23 1445 08/12/23 1500 08/12/23 1513  BP: 115/67 116/70 123/80 122/72  Pulse: 81 75 83 77  Resp: 14 11 14 18   Temp:   97.7 F (36.5 C) 98.4 F (36.9 C)  TempSrc:    Oral  SpO2: 92% 93% 92% 98%  Weight:      Height:        Intake/Output Summary (Last 24 hours) at 08/12/2023 1518 Last data filed at 08/12/2023 1340 Gross per 24 hour  Intake 1640 ml  Output 600 ml  Net 1040 ml   Filed Weights   08/08/23 1400 08/12/23 0909  Weight: 67 kg 68 kg    Scheduled Meds:  acetaminophen  1,000 mg Oral Q6H WA   amLODipine  10 mg Oral Daily   atorvastatin  20 mg Oral Daily   DULoxetine  60 mg Oral Daily   gabapentin  200 mg Oral BID   lamoTRIgine  200 mg Oral Daily   latanoprost  1 drop Both Eyes QHS   pantoprazole  40 mg Oral BID   Continuous Infusions:  Nutritional status     Body mass index is 28.34 kg/m.  Data Reviewed:   CBC: Recent Labs  Lab 08/08/23 1524 08/09/23 0640 08/12/23 0445  WBC 5.1 4.5 5.8  HGB 12.0 11.3* 12.7  HCT 35.5* 34.4* 38.4  MCV 87.4 88.4 87.9  PLT 262 250 324   Basic Metabolic Panel: Recent Labs  Lab 08/08/23 1524 08/09/23 0640 08/10/23 0713 08/12/23 0445  NA 135 137 138 134*  K 3.6 3.2* 3.5 3.3*  CL 102 105 106 103  CO2 23 20* 20* 21*  GLUCOSE 89 80 83 99  BUN 5* <5* <5* 10  CREATININE 0.84 0.78 0.59 0.67  CALCIUM 9.3 8.6* 8.5* 8.5*  MG 1.9 1.8 1.7  --   PHOS 2.9 2.9 2.7  --    GFR: Estimated Creatinine Clearance: 57.7 mL/min (by C-G formula based on SCr of 0.67 mg/dL). Liver Function Tests: Recent Labs  Lab 08/08/23 1524 08/09/23 0640 08/10/23 0713  AST 22  --   --   ALT 14  --   --   ALKPHOS 64  --   --   BILITOT 1.0  --   --   PROT 6.5  --   --   ALBUMIN 3.1* 2.6* 2.7*   No results for input(s): "LIPASE", "AMYLASE" in the last 168 hours. Recent Labs  Lab  08/08/23 1608  AMMONIA 41*   Coagulation Profile: No results for input(s): "INR", "PROTIME" in the last 168 hours. Cardiac Enzymes: Recent Labs  Lab 08/08/23 1524  CKTOTAL 262*   BNP (last 3 results) No results for input(s): "PROBNP" in the last 8760 hours. HbA1C: No results for input(s): "HGBA1C" in the last 72 hours. CBG: No results for input(s): "GLUCAP" in the last 168 hours. Lipid Profile: No results for input(s): "CHOL", "HDL", "LDLCALC", "  TRIG", "CHOLHDL", "LDLDIRECT" in the last 72 hours. Thyroid Function Tests: No results for input(s): "TSH", "T4TOTAL", "FREET4", "T3FREE", "THYROIDAB" in the last 72 hours. Anemia Panel: No results for input(s): "VITAMINB12", "FOLATE", "FERRITIN", "TIBC", "IRON", "RETICCTPCT" in the last 72 hours. Sepsis Labs: No results for input(s): "PROCALCITON", "LATICACIDVEN" in the last 168 hours.  Recent Results (from the past 240 hours)  Surgical pcr screen     Status: None   Collection Time: 08/10/23  8:23 PM   Specimen: Nasal Mucosa; Nasal Swab  Result Value Ref Range Status   MRSA, PCR NEGATIVE NEGATIVE Final   Staphylococcus aureus NEGATIVE NEGATIVE Final    Comment: (NOTE) The Xpert SA Assay (FDA approved for NASAL specimens in patients 19 years of age and older), is one component of a comprehensive surveillance program. It is not intended to diagnose infection nor to guide or monitor treatment. Performed at [facility], [address], [place], [place] [address]          Radiology Studies: DG Humerus Right Result Date: 08/12/2023 CLINICAL DATA:  Elective surgery. EXAM: RIGHT HUMERUS - 2+ VIEW COMPARISON:  Preoperative imaging FINDINGS: Four fluoroscopic spot views of the right humerus submitted from the operating room. Plate and screw as well as inter fragmentary screw fixation of proximal humeral fracture. There is been previous humeral head arthroplasty. Fluoroscopy time 42 seconds. Dose 4.71 mGy. IMPRESSION:  Intraoperative fluoroscopy during proximal humeral fracture fixation. Electronically Signed   By: Narda Rutherford M.D.   On: 08/12/2023 14:44   DG C-Arm 1-60 Min-No Report Result Date: 08/12/2023 Fluoroscopy was utilized by the requesting physician.  No radiographic interpretation.   DG C-Arm 1-60 Min-No Report Result Date: 08/12/2023 Fluoroscopy was utilized by the requesting physician.  No radiographic interpretation.           LOS: 0 days   Time spent= 35 mins    Miguel Rota, MD [facility]  If 7PM-7AM, please contact night-coverage  08/12/2023, 3:18 PM

## 2023-08-12 NOTE — Anesthesia Procedure Notes (Signed)
 Anesthesia Regional Block: Interscalene brachial plexus block   Pre-Anesthetic Checklist: , timeout performed,  Correct Patient, Correct Site, Correct Laterality,  Correct Procedure, Correct Position, site marked,  Risks and benefits discussed,  Pre-op evaluation,  At surgeon's request and post-op pain management  Laterality: Right  Prep: Maximum Sterile Barrier Precautions used, chloraprep       Needles:  Injection technique: Single-shot  Needle Type: Echogenic Stimulator Needle     Needle Length: 5cm  Needle Gauge: 22     Additional Needles:   Procedures:,,,, ultrasound used (permanent image in chart),,    Narrative:  Start time: 08/12/2023 10:01 AM End time: 08/12/2023 10:11 AM Injection made incrementally with aspirations every 5 mL.  Performed by: Personally  Anesthesiologist: Gaynelle Adu, MD

## 2023-08-12 NOTE — Plan of Care (Signed)

## 2023-08-12 NOTE — TOC Progression Note (Addendum)
 Transition of Care [facility]) - Progression Note    Patient Details  Name: Sydney Evans MRN: [id number] Date of Birth: [birth year]  [facility] CM/SW Contact  Mearl Latin, LCSW Phone Number: 08/12/2023, 4:53 PM  Clinical Narrative:    4:15pm-CSW met with patient and her daughter, [name], at bedside. CSW presented SNF bed offers and she will review with patient's family.   5:30 PM-CSW received notification from patent's daughter requesting [facility] SN[facility]. CSW requested facility start insurance process.  Expected Discharge Plan: Skilled Nursing Facility Barriers to Discharge: Continued Medical Work up  Expected Discharge Plan and Services       Living arrangements for the past 2 months: Single Family Home                                       Social Determinants of Health (SDOH) Interventions SDOH Screenings   Food Insecurity: No Food Insecurity (08/08/2023)  Housing: Patient Unable To Answer (08/08/2023)  Transportation Needs: Patient Unable To Answer (08/08/2023)  Utilities: Patient Unable To Answer (08/08/2023)  Alcohol Screen: Low Risk  (09/09/2022)  Depression (PHQ2-9): Low Risk  (12/24/2022)  Financial Resource Strain: Low Risk  (02/15/2020)  Physical Activity: Insufficiently Active (11/26/2021)  Social Connections: Patient Unable To Answer (08/08/2023)  Stress: Stress Concern Present (02/15/2020)  Tobacco Use: High Risk (08/12/2023)    Readmission Risk Interventions     No data to display

## 2023-08-13 ENCOUNTER — Encounter (HOSPITAL_COMMUNITY): Payer: Self-pay | Admitting: Orthopedic Surgery

## 2023-08-13 DIAGNOSIS — F419 Anxiety disorder, unspecified: Secondary | ICD-10-CM | POA: Diagnosis present

## 2023-08-13 DIAGNOSIS — I6930 Unspecified sequelae of cerebral infarction: Secondary | ICD-10-CM | POA: Diagnosis not present

## 2023-08-13 DIAGNOSIS — G629 Polyneuropathy, unspecified: Secondary | ICD-10-CM | POA: Diagnosis not present

## 2023-08-13 DIAGNOSIS — M810 Age-related osteoporosis without current pathological fracture: Secondary | ICD-10-CM | POA: Diagnosis present

## 2023-08-13 DIAGNOSIS — Z96611 Presence of right artificial shoulder joint: Secondary | ICD-10-CM | POA: Diagnosis not present

## 2023-08-13 DIAGNOSIS — G9340 Encephalopathy, unspecified: Secondary | ICD-10-CM | POA: Diagnosis present

## 2023-08-13 DIAGNOSIS — Z8541 Personal history of malignant neoplasm of cervix uteri: Secondary | ICD-10-CM | POA: Diagnosis not present

## 2023-08-13 DIAGNOSIS — Z96652 Presence of left artificial knee joint: Secondary | ICD-10-CM | POA: Diagnosis not present

## 2023-08-13 DIAGNOSIS — Z7401 Bed confinement status: Secondary | ICD-10-CM | POA: Diagnosis not present

## 2023-08-13 DIAGNOSIS — T879 Unspecified complications of amputation stump: Secondary | ICD-10-CM | POA: Diagnosis not present

## 2023-08-13 DIAGNOSIS — M6281 Muscle weakness (generalized): Secondary | ICD-10-CM | POA: Diagnosis not present

## 2023-08-13 DIAGNOSIS — Y92009 Unspecified place in unspecified non-institutional (private) residence as the place of occurrence of the external cause: Secondary | ICD-10-CM | POA: Diagnosis not present

## 2023-08-13 DIAGNOSIS — Z96653 Presence of artificial knee joint, bilateral: Secondary | ICD-10-CM | POA: Diagnosis present

## 2023-08-13 DIAGNOSIS — W050XXA Fall from non-moving wheelchair, initial encounter: Secondary | ICD-10-CM | POA: Diagnosis present

## 2023-08-13 DIAGNOSIS — Z89511 Acquired absence of right leg below knee: Secondary | ICD-10-CM | POA: Diagnosis not present

## 2023-08-13 DIAGNOSIS — Z8261 Family history of arthritis: Secondary | ICD-10-CM | POA: Diagnosis not present

## 2023-08-13 DIAGNOSIS — R531 Weakness: Secondary | ICD-10-CM | POA: Diagnosis not present

## 2023-08-13 DIAGNOSIS — F17210 Nicotine dependence, cigarettes, uncomplicated: Secondary | ICD-10-CM | POA: Diagnosis present

## 2023-08-13 DIAGNOSIS — R296 Repeated falls: Secondary | ICD-10-CM | POA: Insufficient documentation

## 2023-08-13 DIAGNOSIS — Z720 Tobacco use: Secondary | ICD-10-CM | POA: Diagnosis not present

## 2023-08-13 DIAGNOSIS — R001 Bradycardia, unspecified: Secondary | ICD-10-CM | POA: Diagnosis not present

## 2023-08-13 DIAGNOSIS — R627 Adult failure to thrive: Secondary | ICD-10-CM | POA: Diagnosis present

## 2023-08-13 DIAGNOSIS — S42351A Displaced comminuted fracture of shaft of humerus, right arm, initial encounter for closed fracture: Secondary | ICD-10-CM | POA: Diagnosis present

## 2023-08-13 DIAGNOSIS — H409 Unspecified glaucoma: Secondary | ICD-10-CM | POA: Diagnosis not present

## 2023-08-13 DIAGNOSIS — Z96612 Presence of left artificial shoulder joint: Secondary | ICD-10-CM | POA: Diagnosis not present

## 2023-08-13 DIAGNOSIS — M8580 Other specified disorders of bone density and structure, unspecified site: Secondary | ICD-10-CM | POA: Diagnosis not present

## 2023-08-13 DIAGNOSIS — Z8249 Family history of ischemic heart disease and other diseases of the circulatory system: Secondary | ICD-10-CM | POA: Diagnosis not present

## 2023-08-13 DIAGNOSIS — Z83438 Family history of other disorder of lipoprotein metabolism and other lipidemia: Secondary | ICD-10-CM | POA: Diagnosis not present

## 2023-08-13 DIAGNOSIS — Z888 Allergy status to other drugs, medicaments and biological substances status: Secondary | ICD-10-CM | POA: Diagnosis not present

## 2023-08-13 DIAGNOSIS — E785 Hyperlipidemia, unspecified: Secondary | ICD-10-CM | POA: Diagnosis not present

## 2023-08-13 DIAGNOSIS — Z8673 Personal history of transient ischemic attack (TIA), and cerebral infarction without residual deficits: Secondary | ICD-10-CM | POA: Diagnosis not present

## 2023-08-13 DIAGNOSIS — Z4789 Encounter for other orthopedic aftercare: Secondary | ICD-10-CM | POA: Diagnosis not present

## 2023-08-13 DIAGNOSIS — F319 Bipolar disorder, unspecified: Secondary | ICD-10-CM | POA: Diagnosis not present

## 2023-08-13 DIAGNOSIS — E876 Hypokalemia: Secondary | ICD-10-CM | POA: Diagnosis not present

## 2023-08-13 DIAGNOSIS — Z66 Do not resuscitate: Secondary | ICD-10-CM | POA: Diagnosis present

## 2023-08-13 DIAGNOSIS — S42301A Unspecified fracture of shaft of humerus, right arm, initial encounter for closed fracture: Secondary | ICD-10-CM | POA: Diagnosis present

## 2023-08-13 DIAGNOSIS — Z993 Dependence on wheelchair: Secondary | ICD-10-CM | POA: Diagnosis not present

## 2023-08-13 DIAGNOSIS — Z79899 Other long term (current) drug therapy: Secondary | ICD-10-CM | POA: Diagnosis not present

## 2023-08-13 DIAGNOSIS — J309 Allergic rhinitis, unspecified: Secondary | ICD-10-CM | POA: Diagnosis not present

## 2023-08-13 DIAGNOSIS — I10 Essential (primary) hypertension: Secondary | ICD-10-CM | POA: Diagnosis not present

## 2023-08-13 DIAGNOSIS — F411 Generalized anxiety disorder: Secondary | ICD-10-CM | POA: Diagnosis not present

## 2023-08-13 DIAGNOSIS — S42301D Unspecified fracture of shaft of humerus, right arm, subsequent encounter for fracture with routine healing: Secondary | ICD-10-CM | POA: Diagnosis not present

## 2023-08-13 DIAGNOSIS — F3130 Bipolar disorder, current episode depressed, mild or moderate severity, unspecified: Secondary | ICD-10-CM | POA: Diagnosis not present

## 2023-08-13 DIAGNOSIS — K219 Gastro-esophageal reflux disease without esophagitis: Secondary | ICD-10-CM | POA: Diagnosis present

## 2023-08-13 DIAGNOSIS — I700 Atherosclerosis of aorta: Secondary | ICD-10-CM | POA: Diagnosis not present

## 2023-08-13 LAB — BASIC METABOLIC PANEL WITH GFR
Anion gap: 10 (ref 5–15)
BUN: 11 mg/dL (ref 8–23)
CO2: 19 mmol/L — ABNORMAL LOW (ref 22–32)
Calcium: 8.1 mg/dL — ABNORMAL LOW (ref 8.9–10.3)
Chloride: 106 mmol/L (ref 98–111)
Creatinine, Ser: 0.7 mg/dL (ref 0.44–1.00)
GFR, Estimated: >60 mL/min (ref >60)
Glucose, Bld: 128 mg/dL — ABNORMAL HIGH (ref 70–99)
Potassium: 4.4 mmol/L (ref 3.5–5.1)
Sodium: 135 mmol/L (ref 135–145)

## 2023-08-13 LAB — CBC
HCT: 33.9 % — ABNORMAL LOW (ref 36.0–46.0)
Hemoglobin: 11.3 g/dL — ABNORMAL LOW (ref 12.0–15.0)
MCH: 29.7 pg (ref 26.0–34.0)
MCHC: 33.3 g/dL (ref 30.0–36.0)
MCV: 89 fL (ref 80.0–100.0)
Platelets: 303 10*3/uL (ref 150–400)
RBC: 3.81 MIL/uL — ABNORMAL LOW (ref 3.87–5.11)
RDW: 14.3 % (ref 11.5–15.5)
WBC: 6.2 10*3/uL (ref 4.0–10.5)
nRBC: 0 % (ref 0.0–0.2)

## 2023-08-13 LAB — PHOSPHORUS: Phosphorus: 4.7 mg/dL — ABNORMAL HIGH (ref 2.5–4.6)

## 2023-08-13 NOTE — TOC Progression Note (Addendum)
 Transition of Care [facility]) - Progression Note    Patient Details  Name: Sydney Evans MRN: [id number] Date of Birth: 06/20/1950  [facility]) CM/SW Contact  Baldemar Lenis, [place] Phone Number: 08/13/2023, 10:52 AM  Clinical Narrative:   CSW spoke with Admissions at [facility], confirmed they were starting insurance authorization this morning. CSW to follow.  UPDATE: CSW notified by [place] that insurance will need updated therapy notes for authorization request. CSW sent note to PT and OT assigned to please place updated note when able. CSW to send to [place] once notes are entered.    Expected Discharge Plan: Skilled Nursing Facility Barriers to Discharge: Continued Medical Work up, English as a second language teacher  Expected Discharge Plan and Services       Living arrangements for the past 2 months: Single Family Home                                       Social Determinants of Health (SDOH) Interventions SDOH Screenings   Food Insecurity: No Food Insecurity (08/08/2023)  Housing: Patient Unable To Answer (08/08/2023)  Transportation Needs: Patient Unable To Answer (08/08/2023)  Utilities: Patient Unable To Answer (08/08/2023)  Alcohol Screen: Low Risk  (09/09/2022)  Depression (PHQ2-9): Low Risk  (12/24/2022)  Financial Resource Strain: Low Risk  (02/15/2020)  Physical Activity: Insufficiently Active (11/26/2021)  Social Connections: Patient Unable To Answer (08/08/2023)  Stress: Stress Concern Present (02/15/2020)  Tobacco Use: High Risk (08/12/2023)    Readmission Risk Interventions     No data to display

## 2023-08-13 NOTE — Progress Notes (Signed)
 Occupational Therapy Treatment Patient Details Name: Sydney Evans MRN: [id number] DOB: 09-16-1950 Today's Date: 08/13/2023   History of present illness 73 y.o. female presenting 4/2 after fall from wheelchair. Found to have comminuted oblique/spiral midshaft fx of the R humerus. S/P ORIF to RUE on 09/01/23. PMH significant for CVA (with residual L hearing deficit), right BKA, HTN, anxiety, bipolar disorder, tobacco use disorder, hyperlipidemia, right shoulder replacement and GERD.   OT comments  Pt progressing toward established OT goals; goals updated secondary to pt now s/p ORIF and need for goals related to shoulder precautions. Pt needing min A +2 for squat pivot to L today. Pt educated and demo elbow/wrist/hand ROM. Educated regarding UB bathing, UB dressing, LB dressing, positioning for sleep, sling application and wear schedule, as well as NWB precautions and ROM recommendations. Pt needed mod-max cues to maintain no ROM at shoulder during mobility and ADL. Patient will benefit from continued inpatient follow up therapy, <3 hours/day       If plan is discharge home, recommend the following:  Two people to help with walking and/or transfers;Two people to help with bathing/dressing/bathroom;Assistance with cooking/housework;Assist for transportation;Help with stairs or ramp for entrance   Equipment Recommendations  Other (comment) (defer)    Recommendations for Other Services      Precautions / Restrictions Precautions Precautions: Fall;Shoulder Recall of Precautions/Restrictions: Impaired Precaution/Restrictions Comments: No PROM/AROM of shoulder. AROM elbow/wrist/hand. needs reminders for ROM/WB restrictions Required Braces or Orthoses: Sling (RUE at all times) Restrictions Weight Bearing Restrictions Per Provider Order: Yes RUE Weight Bearing Per Provider Order: Non weight bearing Other Position/Activity Restrictions: Rt shoulder sling, NO active or passive R shoulder ROM        Mobility Bed Mobility Overal bed mobility: Needs Assistance Bed Mobility: Supine to Sit, Rolling Rolling: Contact guard assist, Used rails   Supine to sit: Min assist, HOB elevated     General bed mobility comments: Min assist for trunk and LE guidance into bed to avoid lying on Rt shoulder. Cues for technique. Pt able to roll toward her R side partially with cues for technique but ultimately safer to long sit in bed then rotate limbs and hips around to EOB. ModA for anterior scooting to foot flat.    Transfers Overall transfer level: Needs assistance Equipment used: 2 person hand held assist Transfers: Sit to/from Stand, Bed to chair/wheelchair/BSC Sit to Stand: Min assist, +2 safety/equipment, From elevated surface   Squat pivot transfers: Min assist, +2 physical assistance, From elevated surface       General transfer comment: Squat pivot toward drop arm chair on her L side with +2 minA needing bed pad/gait belt lift and steadying assist. x3 small squat/scoots to reach chair from EOB with chair directly adjacent. Pt then able to stand from chair with L arm rest support for LUE; min assist blocking Rt side and gait belt lift assist while pillows/pads repositioned under her hips.     Balance Overall balance assessment: Needs assistance Sitting-balance support: No upper extremity supported, Feet supported Sitting balance-Leahy Scale: Good     Standing balance support: Single extremity supported Standing balance-Leahy Scale: Poor Standing balance comment: minA for standing with LUE support but needs +2 for safety with squat pivots as pt does not have use of RUE currently and no prosthetic available at this time.                           ADL either performed or assessed  with clinical judgement   ADL Overall ADL's : Needs assistance/impaired Eating/Feeding: Set up;Sitting   Grooming: Minimal assistance;Sitting   Upper Body Bathing: Moderate assistance;Sitting    Lower Body Bathing: Maximal assistance;+2 for physical assistance;+2 for safety/equipment;Sit to/from stand   Upper Body Dressing : Maximal assistance;Sitting   Lower Body Dressing: Moderate assistance;Bed level Lower Body Dressing Details (indicate cue type and reason): to don L sock Toilet Transfer: Minimal assistance;+2 for physical assistance;+2 for safety/equipment;Squat-pivot                  Extremity/Trunk Assessment Upper Extremity Assessment Upper Extremity Assessment: Generalized weakness;RUE deficits/detail RUE Deficits / Details: Nerve block still active with pt able to perform composite flexion, lacking ~20 degrees extension, lacking ~20 degrees supination, ~20 degrees active range at elbow. Educated regaridng precautions   Lower Extremity Assessment Lower Extremity Assessment: Defer to PT evaluation        Vision       Perception     Praxis     Communication Communication Communication: Impaired Factors Affecting Communication: Hearing impaired   Cognition Arousal: Alert Behavior During Therapy: WFL for tasks assessed/performed Cognition: No family/caregiver present to determine baseline             OT - Cognition Comments: following basic commands and is oriented, unable to recall events from day of surgery or OT/PT session this weekend. cues to avoid using RUE in a way that requires shoulder ROM                 Following commands: Intact        Cueing   Cueing Techniques: Verbal cues  Exercises Exercises: Shoulder Shoulder Exercises Elbow Flexion: AAROM, 10 reps, Right Elbow Extension: AAROM, Right, 10 reps Wrist Flexion: AROM, Right, 10 reps Wrist Extension: AAROM, Right, 10 reps Digit Composite Flexion: AROM, Right, 10 reps Composite Extension: AROM, Right, 10 reps Neck Flexion: AROM, 5 reps Neck Extension: AROM, 5 reps Neck Lateral Flexion - Right: AROM, 5 reps Neck Lateral Flexion - Left: AROM, 5 reps    Shoulder  Instructions Shoulder Instructions Donning/doffing shirt without moving shoulder:  (educated) Method for sponge bathing under operated UE: Minimal assistance Donning/doffing sling/immobilizer: Maximal assistance Correct positioning of sling/immobilizer:  (educated) Pendulum exercises (written home exercise program):  (not in orders) ROM for elbow, wrist and digits of operated UE: Moderate assistance Sling wearing schedule (on at all times/off for ADL's):  (educated) Proper positioning of operated UE when showering:  (educated) Positioning of UE while sleeping:  (educated)     General Comments SpO2/HR WFL on RA while seated    Pertinent Vitals/ Pain       Pain Assessment Pain Assessment: Faces Faces Pain Scale: Hurts little more Pain Location: RUE Pain Descriptors / Indicators: Grimacing, Guarding Pain Intervention(s): Limited activity within patient's tolerance, Monitored during session, Repositioned, Ice applied  Home Living                                          Prior Functioning/Environment              Frequency  Min 1X/week        Progress Toward Goals  OT Goals(current goals can now be found in the care plan section)  Progress towards OT goals: Goals updated  Acute Rehab OT Goals Patient Stated Goal: get better OT Goal Formulation: With patient Time  For Goal Achievement: 08/23/23 Potential to Achieve Goals: Good  Plan      Co-evaluation    PT/OT/SLP Co-Evaluation/Treatment: Yes Reason for Co-Treatment: Complexity of the patient's impairments (multi-system involvement);For patient/therapist safety;To address functional/ADL transfers PT goals addressed during session: Mobility/safety with mobility;Balance;Strengthening/ROM        AM-PAC OT "6 Clicks" Daily Activity     Outcome Measure   Help from another person eating meals?: A Little Help from another person taking care of personal grooming?: A Little Help from another person  toileting, which includes using toliet, bedpan, or urinal?: A Lot Help from another person bathing (including washing, rinsing, drying)?: A Lot Help from another person to put on and taking off regular upper body clothing?: A Lot Help from another person to put on and taking off regular lower body clothing?: A Lot 6 Click Score: 14    End of Session Equipment Utilized During Treatment: Gait belt;Other (comment) (R shoulder sling)  OT Visit Diagnosis: Unsteadiness on feet (R26.81);Muscle weakness (generalized) (M62.81);Pain;Other abnormalities of gait and mobility (R26.89);History of falling (Z91.81) Pain - Right/Left: Right Pain - part of body: Shoulder;Arm   Activity Tolerance Patient tolerated treatment well   Patient Left with call bell/phone within reach;in chair;with chair alarm set   Nurse Communication Mobility status        Time: 6578-4696 OT Time Calculation (min): 44 min  Charges: OT General Charges $OT Visit: 1 Visit OT Treatments $Self Care/Home Management : 8-22 mins $Therapeutic Exercise: 8-22 mins  Myrla Halsted, OTD, OTR/L [facility] Acute Rehabilitation Office: [phone]   Myrla Halsted 08/13/2023, 5:09 PM

## 2023-08-13 NOTE — Plan of Care (Signed)
  Problem: Health Behavior/Discharge Planning: Goal: Ability to manage health-related needs will improve Outcome: Progressing   Problem: Clinical Measurements: Goal: Ability to maintain clinical measurements within normal limits will improve Outcome: Progressing Goal: Will remain free from infection Outcome: Progressing Goal: Diagnostic test results will improve Outcome: Progressing Goal: Respiratory complications will improve Outcome: Progressing Goal: Cardiovascular complication will be avoided Outcome: Progressing   Problem: Activity: Goal: Risk for activity intolerance will decrease Outcome: Progressing   Problem: Nutrition: Goal: Adequate nutrition will be maintained Outcome: Progressing   Problem: Coping: Goal: Level of anxiety will decrease Outcome: Progressing   Problem: Elimination: Goal: Will not experience complications related to bowel motility Outcome: Progressing Goal: Will not experience complications related to urinary retention Outcome: Progressing   Problem: Pain Managment: Goal: General experience of comfort will improve and/or be controlled Outcome: Progressing   Problem: Safety: Goal: Ability to remain free from injury will improve Outcome: Progressing   Problem: Skin Integrity: Goal: Risk for impaired skin integrity will decrease Outcome: Progressing   Problem: Education: Goal: Knowledge of the prescribed therapeutic regimen will improve Outcome: Progressing Goal: Understanding of activity limitations/precautions following surgery will improve Outcome: Progressing Goal: Individualized Educational Video(s) Outcome: Progressing   Problem: Activity: Goal: Ability to tolerate increased activity will improve Outcome: Progressing   Problem: Pain Management: Goal: Pain level will decrease with appropriate interventions Outcome: Progressing

## 2023-08-13 NOTE — Op Note (Signed)
 08/12/2023  8:32 AM  PATIENT:  Sydney Evans    PRE-OPERATIVE DIAGNOSIS:  RIGHT PROXIMAL AND SHAFT HUMERUS FRACTURES  POST-OPERATIVE DIAGNOSIS:  Same  PROCEDURE:  OPEN REDUCTION INTERNAL FIXATION (ORIF) PROXIMAL HUMERUS FRACTURE  SURGEON:  Sheral Apley, MD  PHYSICIAN ASSISTANT: Levester Fresh, PA-C, he was present and scrubbed throughout the case, critical for completion in a timely fashion, and for retraction, instrumentation, and closure.   ANESTHESIA:   General  PREOPERATIVE INDICATIONS:  Sydney Evans is a  73 y.o. female with a diagnosis of RIGHT PROXIMAL AND SHAFT HUMERUS FRACTURES who elected for surgical management.    The risks benefits and alternatives were discussed with the patient including but not limited to the risks of nonoperative treatment, versus surgical intervention including infection, bleeding, nerve injury, malunion, nonunion, the need for revision surgery, hardware prominence, hardware failure, the need for hardware removal, blood clots, cardiopulmonary complications, conversion to arthroplasty, morbidity, mortality, among others, and they were willing to proceed.  Predicted outcome is good, although there will be at least a six to nine month expected recovery.    OPERATIVE IMPLANTS: Biomet locking plate 11 hole  OPERATIVE FINDINGS: Displaced proximal humerus fracture, displaced humaral shaft fx.   OPERATIVE PROCEDURE: The patient was brought to the operating room and placed in the supine position. General anesthesia was administered. IV antibiotics were given. She was placed in the beach chair position. All bony prominences were padded. The upper extremity was prepped and draped in usual sterile fashion. Deltopectoral incision was performed.  I exposed the fracture site, and placed deep retractors.  I placed lag screws across bot the proximal humeral and diaphyseal humeral fracture.   I selected the plate to cross both fractures  I applied the plate and  secured it into the sliding hole first. I confirmed position of the reduction and the plate with C-arm, and I placed a total of 2 guidewires into the appropriate position in the head. I was satisfied that the plate was distal appropriately, and then secured the plate proximally with smooth pegs, taking care to prevent penetration into the arch articular surface, using C-arm, as well as manual feel using a hand drill.  I then secured the plate distally using cortical screws.   Once complete fixation and reduction of been achieved, took final C-arm pictures, and irrigated the wounds copiously, and repaired the deltopectoral interval with Vicryl followed by Vicryl for the subcutaneous tissue with Monocryl and Steri-Strips for the skin. She was placed in a sling. She had a preoperative regional block as well. She tolerated the procedure well with no complications.   POST OPERATIVE PLAN: Sling full time, DVT px: Ambulation and foot pumps

## 2023-08-13 NOTE — Assessment & Plan Note (Addendum)
 Chronic, recurrent.  Discussed in detail ways to avoid slipping forward in chair or commode. Poor upper body strength likely because of frequent falls but she is unable to support herself and transfer easily..  Daughter states she tries to do this frequently on her own without assistance.   Recommended consideration of physical therapy to strengthen upper body.

## 2023-08-13 NOTE — Assessment & Plan Note (Signed)
 Acute, she would like to return to using prosthesis but her stump is sore and therefore cannot fit the prosthesis. Recommended return to orthopedics for recommendations although likely bruising just needs time to heal.  X-ray several weeks ago was within normal limits and no evidence of fracture on exam in office today.

## 2023-08-13 NOTE — Progress Notes (Signed)
 PROGRESS NOTE    Sydney Evans  AVW:[id number] DOB: 06/11/1950 DOA: 08/08/2023 PCP: Excell Seltzer, MD    Brief Narrative:   73-year-old F with PMH of CVA, right BKA, HTN, anxiety, bipolar disorder, tobacco use disorder, hyperlipidemia, right shoulder replacement and GERD sent to ED from orthopedics office due to incoherence and inability to care for herself. Patient fell off wheelchair and seen in ED on 4/2. X-ray showed comminuted right humerus fracture, and she was discharged home from ED with a splint and sling for outpatient follow-up with Ortho. At outpatient follow up with Dr. Eulah Pont, requested to admit the patient as she was unable to take care of herself at home.  Eventually underwent ORIF on 4/8.  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 Healing  Acute encephalopathy: Resolved Likely delirium and possibly iatrogenic.  No history of dementia.  Encephalopathy workup including basic labs UDS, VBG, UA, TSH, B12 and RPR unrevealing.  Ammonia 41.  CT head after fall on 4/2 without acute finding. - Encephalopathy improved.  She is currently awake and oriented x 4.  No focal neuro deficit. Continue delirium precautions.    Essential hypertension:  Norvasc 10 mg daily     Anxiety and bipolar disorder -mentation at baseline, stable -Continue home lamictal, duloxetine.  -Decreased on gabapentin.   History of right BKA:  Wheelchair dependent now with humerus fracture.  Stable     History of CVA/hyperlipidemia -Stable - Continue home statin, not on antiplatelet PTA.   Tobacco use disorder: Reports smoking about half a pack a day. - Encouraged smoking cessation.  Not interested in nicotine patch   Hypokalemia:  Prn repletion     Severe generalized debility, with right BKA, recurrent frequent falls, no new right proximal humeral fracture Pending PT OT evaluation, anticipating SNF placement postop  DVT prophylaxis: SCD    Code Status: Limited: Do not attempt resuscitation (DNR) -DNR-LIMITED -Do Not Intubate/DNI  Family Communication:   PT OT evaluation will determine final dispo  Subjective:  Doing ok Still having pain in her right arm  Examination:  General exam: drowsy post anesthesia Respiratory system: Clear to auscultation. Respiratory effort normal. Cardiovascular system: S1 & S2 heard, RRR. No JVD, murmurs, rubs, gallops or clicks. No pedal edema. Gastrointestinal system: Abdomen is nondistended, soft and nontender. No organomegaly or masses felt. Normal bowel sounds heard. Central nervous system: Alert and oriented. No focal neurological deficits. Extremities: Symmetric 5 x 5 power. Skin: No rashes, lesions or ulcers Psychiatry: Judgement and insight appear normal. Mood & affect appropriate. RUE dressing is in place.                Diet Orders (From admission, onward)     Start     Ordered   08/12/23 1519  Diet 2 gram sodium Room service appropriate? Yes; Fluid consistency: Thin  Diet effective now  Question Answer Comment  Room service appropriate? Yes   Fluid consistency: Thin      08/12/23 1518            Objective: Vitals:   08/12/23 1513 08/12/23 1745 08/12/23 2120 08/13/23 0721  BP: 122/72 118/65 121/71 134/85  Pulse: 77 83 88 (!) 54  Resp: 18 19 18 18   Temp: 98.4 F (36.9 C) 98.1 F (36.7 C) 98.7 F (37.1 C) (!) 97.4 F (36.3 C)  TempSrc:  Oral  Oral Oral  SpO2: 98% 90% 92% 98%  Weight:      Height:        Intake/Output Summary (Last 24 hours) at 08/13/2023 1107 Last data filed at 08/13/2023 0909 Gross per 24 hour  Intake 1740 ml  Output 100 ml  Net 1640 ml   Filed Weights   08/08/23 1400 08/12/23 0909  Weight: 67 kg 68 kg    Scheduled Meds:  acetaminophen  1,000 mg Oral Q6H WA   amLODipine  10 mg Oral Daily   atorvastatin  20 mg Oral Daily   docusate sodium  100 mg Oral BID   DULoxetine  60 mg Oral Daily   enoxaparin (LOVENOX) injection  40 mg Subcutaneous Q24H   gabapentin  200 mg Oral BID   lamoTRIgine  200 mg Oral Daily   latanoprost  1 drop Both Eyes QHS   pantoprazole  40 mg Oral BID   Continuous Infusions:  Nutritional status     Body mass index is 28.34 kg/m.  Data Reviewed:   CBC: Recent Labs  Lab 08/08/23 1524 08/09/23 0640 08/12/23 0445 08/13/23 0456  WBC 5.1 4.5 5.8 6.2  HGB 12.0 11.3* 12.7 11.3*  HCT 35.5* 34.4* 38.4 33.9*  MCV 87.4 88.4 87.9 89.0  PLT 262 250 324 303   Basic Metabolic Panel: Recent Labs  Lab 08/08/23 1524 08/09/23 0640 08/10/23 0713 08/12/23 0445 08/13/23 0456  NA 135 137 138 134* 135  K 3.6 3.2* 3.5 3.3* 4.4  CL 102 105 106 103 106  CO2 23 20* 20* 21* 19*  GLUCOSE 89 80 83 99 128*  BUN 5* <5* <5* 10 11  CREATININE 0.84 0.78 0.59 0.67 0.70  CALCIUM 9.3 8.6* 8.5* 8.5* 8.1*  MG 1.9 1.8 1.7  --   --   PHOS 2.9 2.9 2.7  --  4.7*   GFR: Estimated Creatinine Clearance: 57.7 mL/min (by C-G formula based on SCr of 0.7 mg/dL). Liver Function Tests: Recent Labs  Lab 08/08/23 1524 08/09/23 0640 08/10/23 0713  AST 22  --   --   ALT 14  --   --   ALKPHOS 64  --   --   BILITOT 1.0  --   --   PROT 6.5  --   --   ALBUMIN 3.1* 2.6* 2.7*   No results for input(s): "LIPASE", "AMYLASE" in the last 168 hours. Recent Labs  Lab 08/08/23 1608  AMMONIA 41*   Coagulation Profile: No results for input(s): "INR", "PROTIME" in the last 168 hours. Cardiac  Enzymes: Recent Labs  Lab 08/08/23 1524  CKTOTAL 262*   BNP (last 3 results) No results for input(s): "PROBNP" in the last 8760 hours. HbA1C: No results for input(s): "HGBA1C" in the last 72 hours. CBG: No results for input(s): "GLUCAP" in the last 168 hours. Lipid Profile: No results for input(s): "CHOL", "HDL", "LDLCALC", "TRIG", "CHOLHDL", "LDLDIRECT" in the last 72 hours. Thyroid Function Tests: No results for input(s): "TSH", "T4TOTAL", "FREET4", "T3FREE", "THYROIDAB"  in the last 72 hours. Anemia Panel: No results for input(s): "VITAMINB12", "FOLATE", "FERRITIN", "TIBC", "IRON", "RETICCTPCT" in the last 72 hours. Sepsis Labs: No results for input(s): "PROCALCITON", "LATICACIDVEN" in the last 168 hours.  Recent Results (from the past 240 hours)  Surgical pcr screen     Status: None   Collection Time: 08/10/23  8:23 PM   Specimen: Nasal Mucosa; Nasal Swab  Result Value Ref Range Status   MRSA, PCR NEGATIVE NEGATIVE Final   Staphylococcus aureus NEGATIVE NEGATIVE Final    Comment: (NOTE) The Xpert SA Assay (FDA approved for NASAL specimens in patients 2 years of age and older), is one component of a comprehensive surveillance program. It is not intended to diagnose infection nor to guide or monitor treatment. Performed at [facility], [address] [place], [place] [address]          Radiology Studies: DG Humerus Right Result Date: 08/12/2023 CLINICAL DATA:  Elective surgery. EXAM: RIGHT HUMERUS - 2+ VIEW COMPARISON:  Preoperative imaging FINDINGS: Four fluoroscopic spot views of the right humerus submitted from the operating room. Plate and screw as well as inter fragmentary screw fixation of proximal humeral fracture. There is been previous humeral head arthroplasty. Fluoroscopy time 42 seconds. Dose 4.71 mGy. IMPRESSION: Intraoperative fluoroscopy during proximal humeral fracture fixation. Electronically Signed   By: Narda Rutherford M.D.   On: 08/12/2023  14:44   DG C-Arm 1-60 Min-No Report Result Date: 08/12/2023 Fluoroscopy was utilized by the requesting physician.  No radiographic interpretation.   DG C-Arm 1-60 Min-No Report Result Date: 08/12/2023 Fluoroscopy was utilized by the requesting physician.  No radiographic interpretation.           LOS: 0 days   Time spent= 35 mins    Miguel Rota, MD [facility]  If 7PM-7AM, please contact night-coverage  08/13/2023, 11:07 AM

## 2023-08-13 NOTE — Assessment & Plan Note (Signed)
 Do not take ibuprofen.  Add  Pepcid AC twice daily for possible stomach irritation.  Avoid acidic trigger.

## 2023-08-13 NOTE — Assessment & Plan Note (Addendum)
 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 Change celexa to Cymbalta  60 mg daily for mood as well as chronic body pain.

## 2023-08-13 NOTE — Progress Notes (Signed)
 Subjective: Patient reports pain as moderate. Block still in effect but also having some decreased sensation to right hand likely not due to block. Tolerating diet.  Urinating.   No CP, SOB.   Objective:   VITALS:   Vitals:   08/12/23 1513 08/12/23 1745 08/12/23 2120 08/13/23 0721  BP: 122/72 118/65 121/71 134/85  Pulse: 77 83 88 (!) 54  Resp: 18 19 18 18   Temp: 98.4 F (36.9 C) 98.1 F (36.7 C) 98.7 F (37.1 C) (!) 97.4 F (36.3 C)  TempSrc: Oral  Oral Oral  SpO2: 98% 90% 92% 98%  Weight:      Height:          Latest Ref Rng & Units 08/13/2023    4:56 AM 08/12/2023    4:45 AM 08/09/2023    6:40 AM  CBC  WBC 4.0 - 10.5 K/uL 6.2  5.8  4.5   Hemoglobin 12.0 - 15.0 g/dL 16.1  09.6  04.5   Hematocrit 36.0 - 46.0 % 33.9  38.4  34.4   Platelets 150 - 400 K/uL 303  324  250       Latest Ref Rng & Units 08/13/2023    4:56 AM 08/12/2023    4:45 AM 08/10/2023    7:13 AM  BMP  Glucose 70 - 99 mg/dL 409  99  83   BUN 8 - 23 mg/dL 11  10  <5   Creatinine 0.44 - 1.00 mg/dL 8.11  9.14  7.82   Sodium 135 - 145 mmol/L 135  134  138   Potassium 3.5 - 5.1 mmol/L 4.4  3.3  3.5   Chloride 98 - 111 mmol/L 106  103  106   CO2 22 - 32 mmol/L 19  21  20    Calcium 8.9 - 10.3 mg/dL 8.1  8.5  8.5    Intake/Output      04/08 0701 04/09 0700 04/09 0701 04/10 0700   P.O. 340    I.V. (mL/kg) 1100 (16.2)    IV Piggyback 500    Total Intake(mL/kg) 1940 (28.5)    Urine (mL/kg/hr) 0 (0) 0 (0)   Emesis/NG output 0 0   Other 0 0   Stool 0 0   Blood 100 0   Total Output 100 0   Net +1840 0        Urine Occurrence 1 x 0 x   Stool Occurrence 0 x 0 x   Emesis Occurrence 0 x 0 x      Physical Exam: General: NAD.  Sitting up in bed, calm, comfortable Resp: No increased wob Cardio: regular rate and rhythm ABD soft Neurologically intact MSK Decreased sensation to back of hand and fingers Intact sensation to palmar surface of hand and fingers Intact pulses distally Weakly able to move  fingers some No wrist extension present, wrist rests in flexion  Edema and ecchymosis to RUE from shoulder to fingers Incision: dressing C/D/I Sling in place   Assessment: 1 Day Post-Op  S/P Procedure(s) (LRB): OPEN REDUCTION INTERNAL FIXATION (ORIF) PROXIMAL HUMERUS FRACTURE (Right) by Dr. Jewel Baize. Eulah Pont on 08/12/23  Principal Problem:   Right arm fracture Active Problems:   GERD (gastroesophageal reflux disease)   Essential hypertension   Hyperlipidemia   Bipolar depression (HCC)   Anxiety   History of cervical cancer   Chronic sinus bradycardia   Tobacco use disorder   Fall at home, initial encounter   Amputation of right lower extremity below knee (  HCC)   Acute encephalopathy   Plan: Will monitor radial nerve function as time goes but fracture may have injured it which could result in lasting deficits  Advance diet Incentive Spirometry Elevate and Apply ice  Weightbearing: NWB RUE Insicional and dressing care: Dressings left intact until follow-up and Reinforce dressings as needed Orthopedic device(s):  sling Showering: Keep dressing dry VTE prophylaxis: Lovenox 40mg  qd , SCDs, ambulation Pain control: PRN Follow - up plan: 2 weeks post op Contact information:  Margarita Rana MD, Levester Fresh PA-C  Dispo: Skilled Nursing Facility/Rehab whenever bed available. Would like OT to work with her at least once to teach her ADLs while in sling. Otherwise ok from orthopedic standpoint to d/c when medically ready.     Jenne Pane, PA-C Office [phone] 08/13/2023, 10:16 AM

## 2023-08-13 NOTE — Progress Notes (Signed)
 Physical Therapy Treatment Patient Details Name: Sydney Evans MRN: [id number] DOB: [birth year] Today's Date: 08/13/2023   History of Present Illness 73 y.o. female presenting 4/2 after fall from wheelchair. Found to have comminuted oblique/spiral midshaft fx of the R humerus. S/P ORIF to RUE on 09/01/23. PMH significant for CVA (with residual L hearing deficit), right BKA, HTN, anxiety, bipolar disorder, tobacco use disorder, hyperlipidemia, right shoulder replacement and GERD.    PT Comments  Pt received in supine, agreeable to therapy session and with good participation and improving tolerance for transfer training. Pt still needing up to modA for anterior seated scooting once long sitting/rotating hips toward EOB and up to minA +2 for sit<>stand and squat pivot transfer toward drop arm chair on her L side. Pt needing increased time and cues to recall and understand RUE ROM and WB restrictions but participating well this date. Pt lives alone at baseline and currently needing +2 for safety/stability with transfers OOB, not yet back to her baseline with current RUE WB and ROM restrictions, will benefit from short term post-acute rehab <3 hours per day.     If plan is discharge home, recommend the following: Two people to help with walking and/or transfers;Two people to help with bathing/dressing/bathroom;Assistance with cooking/housework;Assist for transportation;Help with stairs or ramp for entrance   Can travel by private vehicle     No  Equipment Recommendations  Other (comment) (TBD next venue, currently would need drop arm BSC and wheelchair and increased physical assist daily at home if pt does not get post-acute placement)    Recommendations for Other Services       Precautions / Restrictions Precautions Precautions: Fall Recall of Precautions/Restrictions: Impaired Precaution/Restrictions Comments: needs reminders for ROM restrictions Required Braces or Orthoses: Sling (RUE, at all  times) Restrictions Weight Bearing Restrictions Per Provider Order: Yes RUE Weight Bearing Per Provider Order: Non weight bearing Other Position/Activity Restrictions: Rt shoulder sling, NO active or passive R shoulder ROM     Mobility  Bed Mobility Overal bed mobility: Needs Assistance Bed Mobility: Supine to Sit, Rolling Rolling: Contact guard assist, Used rails   Supine to sit: Min assist, HOB elevated     General bed mobility comments: Min assist for trunk and LE guidance into bed to avoid lying on Rt shoulder. Cues for technique. Pt able to roll toward her R side partially with cues for technique but ultimately safer to long sit in bed then rotate limbs and hips around to EOB. ModA for anterior scooting to foot flat.    Transfers Overall transfer level: Needs assistance Equipment used: 2 person hand held assist Transfers: Sit to/from Stand, Bed to chair/wheelchair/BSC Sit to Stand: Min assist, +2 safety/equipment, From elevated surface     Squat pivot transfers: Min assist, +2 physical assistance, From elevated surface     General transfer comment: Squat pivot toward drop arm chair on her L side with +2 minA needing bed pad/gait belt lift and steadying assist. x3 small squat/scoots to reach chair from EOB with chair directly adjacent. Pt then able to stand from chair with L arm rest support for LUE; min assist blocking Rt side and gait belt lift assist while pillows/pads repositioned under her hips.    Ambulation/Gait                   Stairs             Wheelchair Mobility     Tilt Bed    Modified Rankin (Stroke Patients  Only)       Balance Overall balance assessment: Needs assistance Sitting-balance support: No upper extremity supported, Feet supported Sitting balance-Leahy Scale: Good     Standing balance support: Single extremity supported Standing balance-Leahy Scale: Poor Standing balance comment: minA for standing with LUE support but  needs +2 for safety with squat pivots as pt does not have use of RUE currently and no prosthetic available at this time.                            Communication Communication Communication: Impaired Factors Affecting Communication: Hearing impaired  Cognition Arousal: Alert Behavior During Therapy: WFL for tasks assessed/performed   PT - Cognitive impairments: Sequencing, Problem solving                       PT - Cognition Comments: Benefits from multimodal cues, pt can be tangential. Needs reminders initially for RUE ROM restrictions at her shoulder. Following commands: Intact      Cueing Cueing Techniques: Verbal cues  Exercises General Exercises - Lower Extremity Ankle Circles/Pumps: AROM, Left, 10 reps, Supine Other Exercises Other Exercises: OT worked with pt on RUE ROM during co-tx    General Comments General comments (skin integrity, edema, etc.): SpO2/HR WFL on RA while seated      Pertinent Vitals/Pain Pain Assessment Pain Assessment: Faces Faces Pain Scale: Hurts little more Pain Location: RUE Pain Descriptors / Indicators: Grimacing, Guarding Pain Intervention(s): Limited activity within patient's tolerance, Monitored during session, Repositioned, Other (comment) (tylenol ~2 hours prior)    Home Living                          Prior Function            PT Goals (current goals can now be found in the care plan section) Acute Rehab PT Goals Patient Stated Goal: Get well PT Goal Formulation: With patient Time For Goal Achievement: 08/23/23 Progress towards PT goals: Progressing toward goals    Frequency    Min 2X/week      PT Plan      Co-evaluation PT/OT/SLP Co-Evaluation/Treatment: Yes Reason for Co-Treatment: Complexity of the patient's impairments (multi-system involvement);For patient/therapist safety;To address functional/ADL transfers PT goals addressed during session: Mobility/safety with  mobility;Balance;Strengthening/ROM        AM-PAC PT "6 Clicks" Mobility   Outcome Measure  Help needed turning from your back to your side while in a flat bed without using bedrails?: A Little Help needed moving from lying on your back to sitting on the side of a flat bed without using bedrails?: A Little Help needed moving to and from a bed to a chair (including a wheelchair)?: A Lot Help needed standing up from a chair using your arms (e.g., wheelchair or bedside chair)?: A Lot Help needed to walk in hospital room?: Total Help needed climbing 3-5 steps with a railing? : Total 6 Click Score: 12    End of Session Equipment Utilized During Treatment: Gait belt Activity Tolerance: Patient tolerated treatment well Patient left: with call bell/phone within reach;in chair;with chair alarm set;Other (comment) (OT in room finishing up pt education) Nurse Communication: Mobility status;Need for lift equipment;Other (comment) (Stedy vs face to face squat pivot from drop arm chair) PT Visit Diagnosis: Unsteadiness on feet (R26.81);Pain;Difficulty in walking, not elsewhere classified (R26.2);History of falling (Z91.81);Muscle weakness (generalized) (M62.81) Pain - Right/Left: Right Pain - part  of body: Arm     Time: 1610-9604 PT Time Calculation (min) (ACUTE ONLY): 32 min  Charges:    $Therapeutic Activity: 8-22 mins PT General Charges $$ ACUTE PT VISIT: 1 Visit                     Sydney Evans P., PTA Acute Rehabilitation Services Secure Chat Preferred 9a-5:30pm Office: [phone]    Dorathy Kinsman [facility] 08/13/2023, 4:55 PM

## 2023-08-14 DIAGNOSIS — S42301D Unspecified fracture of shaft of humerus, right arm, subsequent encounter for fracture with routine healing: Secondary | ICD-10-CM | POA: Diagnosis not present

## 2023-08-14 LAB — BASIC METABOLIC PANEL WITH GFR
Anion gap: 9 (ref 5–15)
BUN: 10 mg/dL (ref 8–23)
CO2: 21 mmol/L — ABNORMAL LOW (ref 22–32)
Calcium: 8.4 mg/dL — ABNORMAL LOW (ref 8.9–10.3)
Chloride: 107 mmol/L (ref 98–111)
Creatinine, Ser: 0.69 mg/dL (ref 0.44–1.00)
GFR, Estimated: >60 mL/min (ref >60)
Glucose, Bld: 85 mg/dL (ref 70–99)
Potassium: 3.6 mmol/L (ref 3.5–5.1)
Sodium: 137 mmol/L (ref 135–145)

## 2023-08-14 LAB — CBC
HCT: 31.4 % — ABNORMAL LOW (ref 36.0–46.0)
Hemoglobin: 10.4 g/dL — ABNORMAL LOW (ref 12.0–15.0)
MCH: 30 pg (ref 26.0–34.0)
MCHC: 33.1 g/dL (ref 30.0–36.0)
MCV: 90.5 fL (ref 80.0–100.0)
Platelets: 296 10*3/uL (ref 150–400)
RBC: 3.47 MIL/uL — ABNORMAL LOW (ref 3.87–5.11)
RDW: 14.6 % (ref 11.5–15.5)
WBC: 5.8 10*3/uL (ref 4.0–10.5)
nRBC: 0 % (ref 0.0–0.2)

## 2023-08-14 MED ORDER — ASPIRIN 81 MG PO TBEC: 81.0000 mg | DELAYED_RELEASE_TABLET | Freq: Two times a day (BID) | ORAL | 0 refills | Status: DC

## 2023-08-14 MED ORDER — OXYCODONE HCL 5 MG PO TABS: 5.0000 mg | ORAL_TABLET | ORAL | 0 refills | Status: DC | PRN

## 2023-08-14 NOTE — Progress Notes (Signed)
 PROGRESS NOTE    Sydney Evans  MVH:[id number] DOB: [birth year] DOA: 08/08/2023 PCP: Excell Seltzer, MD    Brief Narrative:   73-year-old F with PMH of CVA, right BKA, HTN, anxiety, bipolar disorder, tobacco use disorder, hyperlipidemia, right shoulder replacement and GERD sent to ED from orthopedics office due to incoherence and inability to care for herself. Patient fell off wheelchair and seen in ED on 4/2. X-ray showed comminuted right humerus fracture, and she was discharged home from ED with a splint and sling for outpatient follow-up with Ortho. At outpatient follow up with Dr. Eulah Pont, requested to admit the patient as she was unable to take care of herself at home.  Eventually underwent ORIF on 4/8.  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 Healing  Acute encephalopathy: Resolved Likely delirium.  Metabolic workup and CT head is negative.  This has not resolved   Essential hypertension:  Norvasc 10 mg daily     Anxiety and bipolar disorder -mentation at baseline, stable -Continue home lamictal, duloxetine.  -Decreased on gabapentin.   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  Stable     History of CVA/hyperlipidemia -Stable - Continue home statin, not on  antiplatelet PTA.   Tobacco use disorder: Reports smoking about half a pack a day. - Encouraged smoking cessation.  Not interested in nicotine patch   Hypokalemia:  Prn repletion     Severe generalized debility, with right BKA, recurrent frequent falls, no new right proximal humeral fracture Pending PT OT evaluation, anticipating SNF placement postop  DVT prophylaxis: SCD    Code Status: Limited: Do not attempt resuscitation (DNR) -DNR-LIMITED -Do Not Intubate/DNI  Family Communication:   PT OT evaluation will determine final dispo  Subjective: Feeling well no complaints   Examination:  General exam: drowsy post anesthesia Respiratory system: Clear to auscultation. Respiratory effort normal. Cardiovascular system: S1 & S2 heard, RRR. No JVD, murmurs, rubs, gallops or clicks. No pedal edema. Gastrointestinal system: Abdomen is nondistended, soft and nontender. No organomegaly or masses felt. Normal bowel sounds heard. Central nervous system: Alert and oriented. No focal neurological deficits. Extremities: Symmetric 5 x 5 power. Skin: No rashes, lesions or ulcers Psychiatry: Judgement and insight appear normal. Mood & affect appropriate. RUE dressing is in place.                Diet Orders (From admission, onward)     Start     Ordered   08/12/23 1519  Diet 2 gram sodium Room service appropriate? Yes; Fluid consistency: Thin  Diet effective now       Question Answer Comment  Room service appropriate? Yes   Fluid consistency: Thin      08/12/23 1518  Objective: Vitals:   08/13/23 1500 08/13/23 2027 08/14/23 0430 08/14/23 0925  BP: 123/82 136/71 129/63 117/65  Pulse: 63 60 (!) 54 (!) 54  Resp: 18 20 18 18   Temp: 98 F (36.7 C) 97.7 F (36.5 C) 98 F (36.7 C) 98.2 F (36.8 C)  TempSrc: Oral Oral Oral   SpO2: 93% 96% 92% 93%  Weight:   69.3 kg   Height:        Intake/Output Summary (Last 24 hours) at 08/14/2023 1126 Last data filed at  08/14/2023 0600 Gross per 24 hour  Intake 600 ml  Output 900 ml  Net -300 ml   Filed Weights   08/08/23 1400 08/12/23 0909 08/14/23 0430  Weight: 67 kg 68 kg 69.3 kg    Scheduled Meds:  acetaminophen  1,000 mg Oral Q6H WA   amLODipine  10 mg Oral Daily   atorvastatin  20 mg Oral Daily   docusate sodium  100 mg Oral BID   DULoxetine  60 mg Oral Daily   enoxaparin (LOVENOX) injection  40 mg Subcutaneous Q24H   gabapentin  200 mg Oral BID   lamoTRIgine  200 mg Oral Daily   latanoprost  1 drop Both Eyes QHS   pantoprazole  40 mg Oral BID   Continuous Infusions:  Nutritional status     Body mass index is 28.87 kg/m.  Data Reviewed:   CBC: Recent Labs  Lab 08/08/23 1524 08/09/23 0640 08/12/23 0445 08/13/23 0456 08/14/23 0449  WBC 5.1 4.5 5.8 6.2 5.8  HGB 12.0 11.3* 12.7 11.3* 10.4*  HCT 35.5* 34.4* 38.4 33.9* 31.4*  MCV 87.4 88.4 87.9 89.0 90.5  PLT 262 250 324 303 296   Basic Metabolic Panel: Recent Labs  Lab 08/08/23 1524 08/09/23 0640 08/10/23 0713 08/12/23 0445 08/13/23 0456 08/14/23 0449  NA 135 137 138 134* 135 137  K 3.6 3.2* 3.5 3.3* 4.4 3.6  CL 102 105 106 103 106 107  CO2 23 20* 20* 21* 19* 21*  GLUCOSE 89 80 83 99 128* 85  BUN 5* <5* <5* 10 11 10   CREATININE 0.84 0.78 0.59 0.67 0.70 0.69  CALCIUM 9.3 8.6* 8.5* 8.5* 8.1* 8.4*  MG 1.9 1.8 1.7  --   --   --   PHOS 2.9 2.9 2.7  --  4.7*  --    GFR: Estimated Creatinine Clearance: 58.3 mL/min (by C-G formula based on SCr of 0.69 mg/dL). Liver Function Tests: Recent Labs  Lab 08/08/23 1524 08/09/23 0640 08/10/23 0713  AST 22  --   --   ALT 14  --   --   ALKPHOS 64  --   --   BILITOT 1.0  --   --   PROT 6.5  --   --   ALBUMIN 3.1* 2.6* 2.7*   No results for input(s): "LIPASE", "AMYLASE" in the last 168 hours. Recent Labs  Lab 08/08/23 1608  AMMONIA 41*   Coagulation Profile: No results for input(s): "INR", "PROTIME" in the last 168 hours. Cardiac Enzymes: Recent Labs  Lab  08/08/23 1524  CKTOTAL 262*   BNP (last 3 results) No results for input(s): "PROBNP" in the last 8760 hours. HbA1C: No results for input(s): "HGBA1C" in the last 72 hours. CBG: No results for input(s): "GLUCAP" in the last 168 hours. Lipid Profile: No results for input(s): "CHOL", "HDL", "LDLCALC", "TRIG", "CHOLHDL", "LDLDIRECT" in the last 72 hours. Thyroid Function Tests: No results for input(s): "TSH", "T4TOTAL", "FREET4", "T3FREE", "THYROIDAB" in the last  72 hours. Anemia Panel: No results for input(s): "VITAMINB12", "FOLATE", "FERRITIN", "TIBC", "IRON", "RETICCTPCT" in the last 72 hours. Sepsis Labs: No results for input(s): "PROCALCITON", "LATICACIDVEN" in the last 168 hours.  Recent Results (from the past 240 hours)  Surgical pcr screen     Status: None   Collection Time: 08/10/23  8:23 PM   Specimen: Nasal Mucosa; Nasal Swab  Result Value Ref Range Status   MRSA, PCR NEGATIVE NEGATIVE Final   Staphylococcus aureus NEGATIVE NEGATIVE Final    Comment: (NOTE) The Xpert SA Assay (FDA approved for NASAL specimens in patients 71 years of age and older), is one component of a comprehensive surveillance program. It is not intended to diagnose infection nor to guide or monitor treatment. Performed at [facility], [address], [place], [place] [address]          Radiology Studies: DG Humerus Right Result Date: 08/12/2023 CLINICAL DATA:  Elective surgery. EXAM: RIGHT HUMERUS - 2+ VIEW COMPARISON:  Preoperative imaging FINDINGS: Four fluoroscopic spot views of the right humerus submitted from the operating room. Plate and screw as well as inter fragmentary screw fixation of proximal humeral fracture. There is been previous humeral head arthroplasty. Fluoroscopy time 42 seconds. Dose 4.71 mGy. IMPRESSION: Intraoperative fluoroscopy during proximal humeral fracture fixation. Electronically Signed   By: Narda Rutherford M.D.   On: 08/12/2023 14:44   DG C-Arm 1-60 Min-No  Report Result Date: 08/12/2023 Fluoroscopy was utilized by the requesting physician.  No radiographic interpretation.   DG C-Arm 1-60 Min-No Report Result Date: 08/12/2023 Fluoroscopy was utilized by the requesting physician.  No radiographic interpretation.           LOS: 1 day   Time spent= 35 mins    Miguel Rota, MD [facility]  If 7PM-7AM, please contact night-coverage  08/14/2023, 11:26 AM

## 2023-08-14 NOTE — Progress Notes (Signed)
 Subjective: Patient reports pain as moderate. Block wearing off. Tolerating diet.  Urinating.   No CP, SOB.   Objective:   VITALS:   Vitals:   08/13/23 1500 08/13/23 2027 08/14/23 0430 08/14/23 0925  BP: 123/82 136/71 129/63 117/65  Pulse: 63 60 (!) 54 (!) 54  Resp: 18 20 18 18   Temp: 98 F (36.7 C) 97.7 F (36.5 C) 98 F (36.7 C) 98.2 F (36.8 C)  TempSrc: Oral Oral Oral   SpO2: 93% 96% 92% 93%  Weight:   69.3 kg   Height:          Latest Ref Rng & Units 08/14/2023    4:49 AM 08/13/2023    4:56 AM 08/12/2023    4:45 AM  CBC  WBC 4.0 - 10.5 K/uL 5.8  6.2  5.8   Hemoglobin 12.0 - 15.0 g/dL 84.1  66.0  63.0   Hematocrit 36.0 - 46.0 % 31.4  33.9  38.4   Platelets 150 - 400 K/uL 296  303  324       Latest Ref Rng & Units 08/14/2023    4:49 AM 08/13/2023    4:56 AM 08/12/2023    4:45 AM  BMP  Glucose 70 - 99 mg/dL 85  160  99   BUN 8 - 23 mg/dL 10  11  10    Creatinine 0.44 - 1.00 mg/dL 1.09  3.23  5.57   Sodium 135 - 145 mmol/L 137  135  134   Potassium 3.5 - 5.1 mmol/L 3.6  4.4  3.3   Chloride 98 - 111 mmol/L 107  106  103   CO2 22 - 32 mmol/L 21  19  21    Calcium 8.9 - 10.3 mg/dL 8.4  8.1  8.5    Intake/Output      04/09 0701 04/10 0700 04/10 0701 04/11 0700   P.O. 600    I.V. (mL/kg)     IV Piggyback     Total Intake(mL/kg) 600 (8.7)    Urine (mL/kg/hr) 1400 (0.8)    Emesis/NG output 0    Other 0    Stool 0    Blood 0    Total Output 1400    Net -800         Urine Occurrence 1 x    Stool Occurrence 0 x    Emesis Occurrence 0 x       Physical Exam: General: NAD.  Sitting up in bed, calm, comfortable  Resp: No increased wob Cardio: regular rate and rhythm ABD soft Neurologically intact MSK Decreased sensation to back of hand and fingers Intact sensation to palmar surface of hand and fingers Intact pulses distally Weakly able to move fingers more Some small flickers of wrist extension present, wrist rests in flexion  Edema and ecchymosis to RUE  from shoulder to fingers Incision: dressing C/D/I Sling in place   Assessment: 2 Days Post-Op  S/P Procedure(s) (LRB): OPEN REDUCTION INTERNAL FIXATION (ORIF) PROXIMAL HUMERUS FRACTURE (Right) by Dr. Jewel Baize. 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)   Acute encephalopathy   Plan: Will monitor radial nerve function as time  goes but fracture may have injured it which could result in lasting deficits  Advance diet Incentive Spirometry Elevate arm on pillows and Apply ice  Weightbearing: NWB RUE Insicional and dressing care: Dressings left intact until follow-up and Reinforce dressings as needed Orthopedic device(s):  sling Showering: Keep dressing dry VTE prophylaxis: Lovenox 40mg  qd , SCDs, ambulation Pain control: PRN Follow - up plan: 2 weeks post op Contact information:  Margarita Rana MD, Levester Fresh PA-C  Dispo: Skilled Nursing Facility/Rehab whenever bed available. Ok from orthopedic standpoint to d/c when medically ready.     Jenne Pane, PA-C Office [phone] 08/14/2023, 10:01 AM

## 2023-08-14 NOTE — TOC Progression Note (Signed)
 Transition of Care [facility]) - Progression Note    Patient Details  Name: Dynasia Kercheval MRN: [id number] Date of Birth: Mar 03, 1951  [facility] of [facility] CM/SW Contact  Dellie Burns [place], [place] Phone Number: 08/14/2023, 3:48 PM  Clinical Narrative: Per Revonda Standard with [facility], auth received from [facility] and they are prepared to admit tomorrow. MD updated.   Dellie Burns, MSW, LCSW [phone] (coverage)        Expected Discharge Plan: Skilled Nursing Facility Barriers to Discharge: Continued Medical Work up, English as a second language teacher  Expected Discharge Plan and Services       Living arrangements for the past 2 months: Single Family Home                                       Social Determinants of Health (SDOH) Interventions SDOH Screenings   Food Insecurity: No Food Insecurity (08/08/2023)  Housing: Patient Unable To Answer (08/08/2023)  Transportation Needs: Patient Unable To Answer (08/08/2023)  Utilities: Patient Unable To Answer (08/08/2023)  Alcohol Screen: Low Risk  (09/09/2022)  Depression (PHQ2-9): Low Risk  (12/24/2022)  Financial Resource Strain: Low Risk  (02/15/2020)  Physical Activity: Insufficiently Active (11/26/2021)  Social Connections: Patient Unable To Answer (08/08/2023)  Stress: Stress Concern Present (02/15/2020)  Tobacco Use: High Risk (08/12/2023)    Readmission Risk Interventions     No data to display

## 2023-08-15 DIAGNOSIS — I6930 Unspecified sequelae of cerebral infarction: Secondary | ICD-10-CM | POA: Diagnosis not present

## 2023-08-15 DIAGNOSIS — F319 Bipolar disorder, unspecified: Secondary | ICD-10-CM | POA: Diagnosis not present

## 2023-08-15 DIAGNOSIS — Z89511 Acquired absence of right leg below knee: Secondary | ICD-10-CM | POA: Diagnosis not present

## 2023-08-15 DIAGNOSIS — S42301D Unspecified fracture of shaft of humerus, right arm, subsequent encounter for fracture with routine healing: Secondary | ICD-10-CM | POA: Diagnosis not present

## 2023-08-15 DIAGNOSIS — F411 Generalized anxiety disorder: Secondary | ICD-10-CM | POA: Diagnosis not present

## 2023-08-15 DIAGNOSIS — F3130 Bipolar disorder, current episode depressed, mild or moderate severity, unspecified: Secondary | ICD-10-CM | POA: Diagnosis not present

## 2023-08-15 DIAGNOSIS — E785 Hyperlipidemia, unspecified: Secondary | ICD-10-CM | POA: Diagnosis not present

## 2023-08-15 DIAGNOSIS — H409 Unspecified glaucoma: Secondary | ICD-10-CM | POA: Diagnosis not present

## 2023-08-15 DIAGNOSIS — E876 Hypokalemia: Secondary | ICD-10-CM | POA: Diagnosis not present

## 2023-08-15 DIAGNOSIS — J309 Allergic rhinitis, unspecified: Secondary | ICD-10-CM | POA: Diagnosis not present

## 2023-08-15 DIAGNOSIS — M6281 Muscle weakness (generalized): Secondary | ICD-10-CM | POA: Diagnosis not present

## 2023-08-15 DIAGNOSIS — R001 Bradycardia, unspecified: Secondary | ICD-10-CM | POA: Diagnosis not present

## 2023-08-15 DIAGNOSIS — M8580 Other specified disorders of bone density and structure, unspecified site: Secondary | ICD-10-CM | POA: Diagnosis not present

## 2023-08-15 DIAGNOSIS — I700 Atherosclerosis of aorta: Secondary | ICD-10-CM | POA: Diagnosis not present

## 2023-08-15 DIAGNOSIS — G629 Polyneuropathy, unspecified: Secondary | ICD-10-CM | POA: Diagnosis not present

## 2023-08-15 DIAGNOSIS — Z7401 Bed confinement status: Secondary | ICD-10-CM | POA: Diagnosis not present

## 2023-08-15 DIAGNOSIS — Z720 Tobacco use: Secondary | ICD-10-CM | POA: Diagnosis not present

## 2023-08-15 DIAGNOSIS — I10 Essential (primary) hypertension: Secondary | ICD-10-CM | POA: Diagnosis not present

## 2023-08-15 DIAGNOSIS — Z96611 Presence of right artificial shoulder joint: Secondary | ICD-10-CM | POA: Diagnosis not present

## 2023-08-15 DIAGNOSIS — Z4789 Encounter for other orthopedic aftercare: Secondary | ICD-10-CM | POA: Diagnosis not present

## 2023-08-15 DIAGNOSIS — Z96612 Presence of left artificial shoulder joint: Secondary | ICD-10-CM | POA: Diagnosis not present

## 2023-08-15 DIAGNOSIS — T879 Unspecified complications of amputation stump: Secondary | ICD-10-CM | POA: Diagnosis not present

## 2023-08-15 DIAGNOSIS — Z96652 Presence of left artificial knee joint: Secondary | ICD-10-CM | POA: Diagnosis not present

## 2023-08-15 DIAGNOSIS — R531 Weakness: Secondary | ICD-10-CM | POA: Diagnosis not present

## 2023-08-15 LAB — BASIC METABOLIC PANEL WITH GFR
Anion gap: 7 (ref 5–15)
BUN: 11 mg/dL (ref 8–23)
CO2: 20 mmol/L — ABNORMAL LOW (ref 22–32)
Calcium: 8.6 mg/dL — ABNORMAL LOW (ref 8.9–10.3)
Chloride: 110 mmol/L (ref 98–111)
Creatinine, Ser: 0.76 mg/dL (ref 0.44–1.00)
GFR, Estimated: >60 mL/min (ref >60)
Glucose, Bld: 81 mg/dL (ref 70–99)
Potassium: 3.6 mmol/L (ref 3.5–5.1)
Sodium: 137 mmol/L (ref 135–145)

## 2023-08-15 LAB — CBC
HCT: 34.7 % — ABNORMAL LOW (ref 36.0–46.0)
Hemoglobin: 11 g/dL — ABNORMAL LOW (ref 12.0–15.0)
MCH: 29.8 pg (ref 26.0–34.0)
MCHC: 31.7 g/dL (ref 30.0–36.0)
MCV: 94 fL (ref 80.0–100.0)
Platelets: 325 10*3/uL (ref 150–400)
RBC: 3.69 MIL/uL — ABNORMAL LOW (ref 3.87–5.11)
RDW: 14.6 % (ref 11.5–15.5)
WBC: 4.9 10*3/uL (ref 4.0–10.5)
nRBC: 0 % (ref 0.0–0.2)

## 2023-08-15 MED ORDER — DOCUSATE SODIUM 100 MG PO CAPS: 100.0000 mg | ORAL_CAPSULE | Freq: Two times a day (BID) | ORAL | Status: DC

## 2023-08-15 MED ORDER — POTASSIUM CHLORIDE CRYS ER 20 MEQ PO TBCR: 40.0000 meq | EXTENDED_RELEASE_TABLET | Freq: Once | ORAL | Status: DC

## 2023-08-15 MED ORDER — GABAPENTIN 100 MG PO CAPS: 200.0000 mg | ORAL_CAPSULE | Freq: Two times a day (BID) | ORAL | Status: DC

## 2023-08-15 MED ORDER — AMLODIPINE BESYLATE 10 MG PO TABS: 10.0000 mg | ORAL_TABLET | Freq: Every day | ORAL | Status: DC

## 2023-08-15 MED ORDER — BISACODYL 10 MG RE SUPP: 10.0000 mg | Freq: Every day | RECTAL | Status: DC | PRN

## 2023-08-15 MED ORDER — SENNOSIDES-DOCUSATE SODIUM 8.6-50 MG PO TABS: 1.0000 | ORAL_TABLET | Freq: Two times a day (BID) | ORAL | Status: DC | PRN

## 2023-08-15 NOTE — Discharge Summary (Signed)
 Physician Discharge Summary  Teniola Tseng ZOX:[id number] DOB: 10-03-1950 DOA: 08/08/2023  PCP: Excell Seltzer, MD  Admit date: 08/08/2023 Discharge date: 08/15/2023  Admitted From: Home Disposition: SNF  Recommendations for Outpatient Follow-up:  Follow up with PCP in 1-2 weeks Please obtain BMP/CBC in one week your next doctors visit.  Outpatient follow-up with orthopedic.  Aspirin twice daily prescribed for DVT prophylaxis along with pain medications Reduced dose of gabapentin and increased Norvasc Patient unsure if she was on propranolol prior to admission.  Will hold off on this for now   Discharge Condition: Stable CODE STATUS: Full code Diet recommendation: Heart healthy  Brief/Interim Summary: Brief Narrative:   73-year-old F with PMH of CVA, right BKA, HTN, anxiety, bipolar disorder, tobacco use disorder, hyperlipidemia, right shoulder replacement and GERD sent to ED from orthopedics office due to incoherence and inability to care for herself. Patient fell off wheelchair and seen in ED on 4/2. X-ray showed comminuted right humerus fracture, and she was discharged home from ED with a splint and sling for outpatient follow-up with Ortho. At outpatient follow up with Dr. Eulah Pont, requested to admit the patient as she was unable to take care of herself at home.  Eventually underwent ORIF on 4/8.  Patient is doing well postoperatively, SNF placement.  [name] updated.   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 Healing  Acute encephalopathy: Resolved Likely delirium.  Metabolic workup and CT head is negative.  This has not resolved   Essential hypertension:  Norvasc 10 mg daily.  Does not appear to be taking propranolol.     Anxiety and bipolar disorder -mentation at baseline, stable -Continue home lamictal, duloxetine.  -Decreased on gabapentin.   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  Stable     History of CVA/hyperlipidemia -Stable - Continue home statin, not on antiplatelet PTA.   Tobacco use disorder: Reports smoking about half a pack a day. - Encouraged smoking cessation.  Not interested in nicotine patch   Hypokalemia:  Prn repletion    DVT prophylaxis: Per orthopedic    Code Status: Limited: Do not attempt resuscitation (DNR) -DNR-LIMITED -Do Not Intubate/DNI  Family Communication:   SNF placement today  Subjective: Doing ok no new complaints.   Examination:  General exam: drowsy post anesthesia Respiratory system: Clear to auscultation. Respiratory effort normal. Cardiovascular system: S1 & S2 heard, RRR. No JVD, murmurs, rubs, gallops or clicks. No pedal edema. Gastrointestinal system: Abdomen is nondistended, soft and nontender. No organomegaly or masses felt. Normal bowel sounds heard. Central nervous system: Alert and oriented. No focal neurological deficits. Extremities: Symmetric 5 x 5 power. Skin: No rashes, lesions or ulcers Psychiatry: Judgement and insight appear normal. Mood & affect appropriate. RUE dressing is in place.    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)   Acute encephalopathy      Discharge Exam: Vitals:   08/15/23 0500 08/15/23 0953  BP: 130/67 (!) 144/70  Pulse: (!) 58 66  Resp: 19 18  Temp: 97.7 F (36.5 C)   SpO2: 90% 95%   Vitals:   08/14/23 1711 08/14/23 2040 08/15/23 0500 08/15/23 0953  BP: (!) 113/52 119/71 130/67 (!) 144/70  Pulse: (!) 55 61 (!) 58 66  Resp: 18 19 19 18   Temp: 98.2 F (36.8 C) (!) 97.5 F (36.4 C) 97.7 F (36.5 C)   TempSrc:  Oral Oral   SpO2: 90% 93% 90% 95%  Weight:      Height:          Discharge Instructions   Allergies as of 08/15/2023       Reactions   Hydroxyzine Hives, Rash   Bactrim [sulfamethoxazole-trimethoprim] Nausea And Vomiting   Dilaudid [hydromorphone] Itching   Ketorolac Rash    May take with benadryl   Toradol [ketorolac Tromethamine] Rash   May take with benadryl   Tramadol Hives   Zofran [ondansetron] Other (See Comments)   Broke mouth out        Medication List     STOP taking these medications    methocarbamol 500 MG tablet Commonly known as: ROBAXIN   oxyCODONE-acetaminophen 5-325 MG tablet Commonly known as: Percocet   propranolol 20 MG tablet Commonly known as: INDERAL       TAKE these medications    acetaminophen 500 MG tablet Commonly known as: TYLENOL Take 500 mg by mouth every 6 (six) hours as needed.   albuterol 108 (90 Base) MCG/ACT inhaler Commonly known as: VENTOLIN HFA INHALE 2 PUFFS BY MOUTH EVERY 4 HOURS AS NEEDED FOR SHORTNESS OF BREATH   amLODipine 10 MG tablet Commonly known as: NORVASC Take 1 tablet (10 mg total) by mouth daily. What changed:  medication strength how much to take   aspirin EC 81 MG tablet Take 1 tablet (81 mg total) by mouth 2 (two) times daily. To prevent blood clots for 30 days after surgery.   atorvastatin 40 MG tablet Commonly known as: LIPITOR Take 0.5 tablets  (20 mg total) by mouth daily.   bisacodyl 10 MG suppository Commonly known as: DULCOLAX Place 1 suppository (10 mg total) rectally daily as needed for severe constipation.   carboxymethylcellulose 0.5 % Soln Commonly known as: Lubricant Eye Drops Place 2 drops into both eyes daily as needed (Dry eye). Systane   docusate sodium 100 MG capsule Commonly known as: COLACE Take 1 capsule (100 mg total) by mouth 2 (two) times daily.   DULoxetine 60 MG capsule Commonly known as: Cymbalta Take 1 capsule (60 mg total) by mouth daily.   fluticasone 50 MCG/ACT nasal spray Commonly known as: FLONASE Place 1 spray into both nostrils daily as needed for allergies or rhinitis.   gabapentin 100 MG capsule Commonly known as: NEURONTIN Take 2 capsules (200 mg total) by mouth 2 (two) times daily. What changed:  medication strength how much to take how to take this when to take this additional instructions   lamoTRIgine 200 MG tablet Commonly known as: LAMICTAL Take 1 tablet (  200 mg total) by mouth daily.   latanoprost 0.005 % ophthalmic solution Commonly known as: Xalatan Place 1 drop into both eyes at bedtime.   oxyCODONE 5 MG immediate release tablet Commonly known as: Roxicodone Take 1 tablet (5 mg total) by mouth every 4 (four) hours as needed for severe pain (pain score 7-10).   pantoprazole 40 MG tablet Commonly known as: PROTONIX Take 1 tablet by mouth twice daily   senna-docusate 8.6-50 MG tablet Commonly known as: Senokot-S Take 1 tablet by mouth 2 (two) times daily as needed for moderate constipation.        Contact information for follow-up providers     Sheral Apley, MD. Schedule an appointment as soon as possible for a visit in 2 week(s).   Specialty: Orthopedic Surgery Contact information: [address] [place] [address] [phone]         Excell Seltzer, MD Follow up in 1 week(s).   Specialty: Family Medicine Contact  information: [address] [place] [address] [phone]              Contact information for after-discharge care     Destination     [facility] .   Service: Skilled Nursing Contact information: [address] [place] [address] [phone]                    Allergies  Allergen Reactions   Hydroxyzine Hives and Rash   Bactrim [Sulfamethoxazole-Trimethoprim] Nausea And Vomiting   Dilaudid [Hydromorphone] Itching   Ketorolac Rash     May take with benadryl   Toradol [Ketorolac Tromethamine] Rash    May take with benadryl   Tramadol Hives   Zofran [Ondansetron] Other (See Comments)    Broke mouth out    You were cared for by a hospitalist during your hospital stay. If you have any questions about your discharge medications or the care you received while you were in the hospital after you are discharged, you can call the unit and asked to speak with the hospitalist on call if the hospitalist that took care of you is not available. Once you are discharged, your primary care physician will handle any further medical issues. Please note that no refills for any discharge medications will be authorized once you are discharged, as it is imperative that you return to your primary care physician (or establish a relationship with a primary care physician if you do not have one) for your aftercare needs so that they can reassess your need for medications and monitor your lab values.  You were cared for by a hospitalist during your hospital stay. If you have any questions about your discharge medications or the care you received while you were in the hospital after you are discharged, you can call the unit and asked to speak with the hospitalist on call if the hospitalist that took care of you is not available. Once you are discharged, your primary care physician will handle any further medical issues. Please note that  NO REFILLS for any discharge medications will be authorized once you are discharged, as it is imperative that you return to your primary care physician (or establish a relationship with a primary care physician if you do not have one) for your aftercare needs so that they can reassess your need for medications and monitor your lab values.  Please request your Prim.MD to go over all Hospital Tests and Procedure/Radiological  results at the follow up, please get all Hospital records sent to your Prim MD by signing hospital release before you go home.  Get CBC, CMP, 2 view Chest X ray checked  by Primary MD during your next visit or SNF MD in 5-7 days ( we routinely change or add medications that can affect your baseline labs and fluid status, therefore we recommend that you get the mentioned basic workup next visit with your PCP, your PCP may decide not to get them or add new tests based on their clinical decision)  On your next visit with your primary care physician please Get Medicines reviewed and adjusted.  If you experience worsening of your admission symptoms, develop shortness of breath, life threatening emergency, suicidal or homicidal thoughts you must seek medical attention immediately by calling 911 or calling your MD immediately  if symptoms less severe.  You Must read complete instructions/literature along with all the possible adverse reactions/side effects for all the Medicines you take and that have been prescribed to you. Take any new Medicines after you have completely understood and accpet all the possible adverse reactions/side effects.   Do not drive, operate heavy machinery, perform activities at heights, swimming or participation in water activities or provide baby sitting services if your were admitted for syncope or siezures until you have seen by Primary MD or a Neurologist and advised to do so again.  Do not drive when taking Pain medications.   Procedures/Studies: DG  Humerus Right Result Date: 08/12/2023 CLINICAL DATA:  Elective surgery. EXAM: RIGHT HUMERUS - 2+ VIEW COMPARISON:  Preoperative imaging FINDINGS: Four fluoroscopic spot views of the right humerus submitted from the operating room. Plate and screw as well as inter fragmentary screw fixation of proximal humeral fracture. There is been previous humeral head arthroplasty. Fluoroscopy time 42 seconds. Dose 4.71 mGy. IMPRESSION: Intraoperative fluoroscopy during proximal humeral fracture fixation. Electronically Signed   By: Narda Rutherford M.D.   On: 08/12/2023 14:44   DG C-Arm 1-60 Min-No Report Result Date: 08/12/2023 Fluoroscopy was utilized by the requesting physician.  No radiographic interpretation.   DG C-Arm 1-60 Min-No Report Result Date: 08/12/2023 Fluoroscopy was utilized by the requesting physician.  No radiographic interpretation.   CT Head Wo Contrast Result Date: 08/06/2023 CLINICAL DATA:  The patient fell from her wheelchair onto the floor with head, neck and face trauma and a known right humeral fracture. There is a laceration of the chin. EXAM: CT HEAD WITHOUT CONTRAST CT MAXILLOFACIAL WITHOUT CONTRAST CT CERVICAL SPINE WITHOUT CONTRAST TECHNIQUE: Multidetector CT imaging of the head, cervical spine, and maxillofacial structures were performed using the standard protocol without intravenous contrast. Multiplanar CT image reconstructions of the cervical spine and maxillofacial structures were also generated. RADIATION DOSE REDUCTION: This exam was performed according to the departmental dose-optimization program which includes automated exposure control, adjustment of the mA and/or kV according to patient size and/or use of iterative reconstruction technique. COMPARISON:  CT scan head 12/12/2022, CTA head and neck and reconstructions 01/18/2022. FINDINGS: CT HEAD FINDINGS Brain: There is a chronic cortical infarct in the right high parietal area. No cortical based acute infarct, hemorrhage, mass  or mass effect are seen. There is mild global atrophy and mild small-vessel disease of the cerebral white matter. The ventricles are normal in size and position. Basal cisterns are patent. Vascular: The carotid siphons are heavily calcified. No hyperdense central vessel is seen. Skull: Negative for fractures or focal lesions. Other: None.  No visible scalp hematoma. CT  MAXILLOFACIAL FINDINGS Osseous: No fracture or mandibular dislocation. No destructive process. There is mandibulofacial osteopenia. The patient is edentulous. Orbits: Negative. No traumatic or inflammatory finding. Again noted old lens replacements. Sinuses: There is patchy partial opacification of the ethmoid air cells, seen previously. Trace membrane thickening in the frontal and maxillary sinuses. The sphenoid sinus, bilateral mastoid air cells and middle ears are clear. The nasal septum is slightly right deviated. All turbinates are intact. The nasal passages and both ostiomeatal units are clear. Soft tissues: The soft tissues of the chin were not fully included in the scan to see the laceration described in the history. There is asymmetric swelling in the lower lip to the left. No space-occupying facial hematoma or mass is evident. The parotid and submandibular glands are unremarkable. CT CERVICAL SPINE FINDINGS Alignment: Again noted is minimal grade 1 anterolisthesis, likely degenerative at C3-4, C4-5 and C5-6, with straightened lordosis. Alignment is unchanged with no traumatic or further listhesis being seen. Narrowing and spurring again noted of the anterior atlantodental joint. Skull base and vertebrae: No acute fracture is evident. There is mild osteopenia. 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 No primary bone lesion or focal pathologic process. Soft tissues and spinal canal: No prevertebral fluid or swelling. No visible canal hematoma. 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 No laryngeal or thyroid mass is seen, but the lower poles of the thyroid gland are obscured by spray artifact from bilateral shoulder replacements. Disc levels: Multilevel moderate facet joint hypertrophy is noted, slightly greater on the left. There is mild cervical spondylosis. Mild disc space loss again at C4-5 and C5-6, with moderate disc space loss C7-T1. Other cervical discs maintained in height. No herniated discs, cord compromise or significant dorsal disc osteophyte complexes are seen. Again noted is moderate left and mild right foraminal stenosis at C3-4, C4-5 and C5-6. Other foramina remain clear. Upper chest: Atherosclerosis in the aortic arch and great vessels. No pneumothorax. Bilateral shoulder replacements. Otherwise negative. Other: None. IMPRESSION: 1. No acute intracranial CT findings or depressed skull fractures. 2. Chronic changes.  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 3. No evidence of facial fractures. 4. Asymmetric swelling in the lower lip to the left. 5. No evidence of cervical fractures or traumatic listhesis. 6. Osteopenia, degenerative change and straightened lordosis. 7. Aortic and branch vessel atherosclerosis. Electronically Signed   By: Almira Bar M.D.   On: 08/06/2023 20:33   CT Cervical Spine Wo Contrast Result Date: 08/06/2023 CLINICAL DATA:  The patient fell from her wheelchair onto the floor with head, neck and face trauma and a known right humeral fracture. There is a laceration of the chin. EXAM: CT HEAD WITHOUT CONTRAST CT MAXILLOFACIAL WITHOUT CONTRAST CT CERVICAL SPINE WITHOUT CONTRAST TECHNIQUE: Multidetector CT imaging of the head, cervical spine, and maxillofacial structures were performed using the standard protocol without intravenous contrast. Multiplanar CT image reconstructions of the cervical spine and maxillofacial structures were also generated.  RADIATION DOSE REDUCTION: This exam was performed according to the departmental dose-optimization program which includes automated exposure control, adjustment of the mA and/or kV according to patient size and/or use of iterative reconstruction technique. COMPARISON:  CT scan head 12/12/2022, CTA head and neck and reconstructions 01/18/2022. FINDINGS: CT HEAD FINDINGS Brain: There is a chronic cortical infarct in the right high parietal area. No cortical based acute infarct,  hemorrhage, mass or mass effect are seen. There is mild global atrophy and mild small-vessel disease of the cerebral white matter. The ventricles are normal in size and position. Basal cisterns are patent. Vascular: The carotid siphons are heavily calcified. No hyperdense central vessel is seen. Skull: Negative for fractures or focal lesions. Other: None.  No visible scalp hematoma. CT MAXILLOFACIAL FINDINGS Osseous: No fracture or mandibular dislocation. No destructive process. There is mandibulofacial osteopenia. The patient is edentulous. Orbits: Negative. No traumatic or inflammatory finding. Again noted old lens replacements. Sinuses: There is patchy partial opacification of the ethmoid air cells, seen previously. Trace membrane thickening in the frontal and maxillary sinuses. The sphenoid sinus, bilateral mastoid air cells and middle ears are clear. The nasal septum is slightly right deviated. All turbinates are intact. The nasal passages and both ostiomeatal units are clear. Soft tissues: The soft tissues of the chin were not fully included in the scan to see the laceration described in the history. There is asymmetric swelling in the lower lip to the left. No space-occupying facial hematoma or mass is evident. The parotid and submandibular glands are unremarkable. CT CERVICAL SPINE FINDINGS Alignment: Again noted is minimal grade 1 anterolisthesis, likely degenerative at C3-4, C4-5 and C5-6, with straightened lordosis. Alignment is  unchanged with no traumatic or further listhesis being seen. Narrowing and spurring again noted of the anterior atlantodental joint. Skull base and vertebrae: No acute fracture is evident. There is mild osteopenia. 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 No primary bone lesion or focal pathologic process. Soft tissues and spinal canal: No prevertebral fluid or swelling. No visible canal hematoma. 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 No laryngeal or thyroid mass is seen, but the lower poles of the thyroid gland are obscured by spray artifact from bilateral shoulder replacements. Disc levels: Multilevel moderate facet joint hypertrophy is noted, slightly greater on the left. There is mild cervical spondylosis. Mild disc space loss again at C4-5 and C5-6, with moderate disc space loss C7-T1. Other cervical discs maintained in height. No herniated discs, cord compromise or significant dorsal disc osteophyte complexes are seen. Again noted is moderate left and mild right foraminal stenosis at C3-4, C4-5 and C5-6. Other foramina remain clear. Upper chest: Atherosclerosis in the aortic arch and great vessels. No pneumothorax. Bilateral shoulder replacements. Otherwise negative. Other: None. IMPRESSION: 1. No acute intracranial CT findings or depressed skull fractures. 2. Chronic changes.  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 3. No evidence of facial fractures. 4. Asymmetric swelling in the lower lip to the left. 5. No evidence of cervical fractures or traumatic listhesis. 6. Osteopenia, degenerative change and straightened lordosis. 7. Aortic and branch vessel atherosclerosis. Electronically Signed   By: Almira Bar M.D.   On: 08/06/2023 20:33   CT Maxillofacial WO CM Result Date: 08/06/2023 CLINICAL DATA:  The patient fell from her wheelchair onto  the floor with head, neck and face trauma and a known right humeral fracture. There is a laceration of the chin. EXAM: CT HEAD WITHOUT CONTRAST CT MAXILLOFACIAL WITHOUT CONTRAST CT CERVICAL SPINE WITHOUT CONTRAST TECHNIQUE: Multidetector CT imaging of the head, cervical spine, and maxillofacial structures were performed using the standard protocol without intravenous contrast. Multiplanar CT image reconstructions of the cervical spine and maxillofacial structures were also generated. RADIATION DOSE REDUCTION: This exam was performed according to  the departmental dose-optimization program which includes automated exposure control, adjustment of the mA and/or kV according to patient size and/or use of iterative reconstruction technique. COMPARISON:  CT scan head 12/12/2022, CTA head and neck and reconstructions 01/18/2022. FINDINGS: CT HEAD FINDINGS Brain: There is a chronic cortical infarct in the right high parietal area. No cortical based acute infarct, hemorrhage, mass or mass effect are seen. There is mild global atrophy and mild small-vessel disease of the cerebral white matter. The ventricles are normal in size and position. Basal cisterns are patent. Vascular: The carotid siphons are heavily calcified. No hyperdense central vessel is seen. Skull: Negative for fractures or focal lesions. Other: None.  No visible scalp hematoma. CT MAXILLOFACIAL FINDINGS Osseous: No fracture or mandibular dislocation. No destructive process. There is mandibulofacial osteopenia. The patient is edentulous. Orbits: Negative. No traumatic or inflammatory finding. Again noted old lens replacements. Sinuses: There is patchy partial opacification of the ethmoid air cells, seen previously. Trace membrane thickening in the frontal and maxillary sinuses. The sphenoid sinus, bilateral mastoid air cells and middle ears are clear. The nasal septum is slightly right deviated. All turbinates are intact. The nasal passages and both ostiomeatal  units are clear. Soft tissues: The soft tissues of the chin were not fully included in the scan to see the laceration described in the history. There is asymmetric swelling in the lower lip to the left. No space-occupying facial hematoma or mass is evident. The parotid and submandibular glands are unremarkable. CT CERVICAL SPINE FINDINGS Alignment: Again noted is minimal grade 1 anterolisthesis, likely degenerative at C3-4, C4-5 and C5-6, with straightened lordosis. Alignment is unchanged with no traumatic or further listhesis being seen. Narrowing and spurring again noted of the anterior atlantodental joint. Skull base and vertebrae: No acute fracture is evident. There is mild osteopenia. 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 No primary bone lesion or focal pathologic process. Soft tissues and spinal canal: No prevertebral fluid or swelling. No visible canal hematoma. 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 No laryngeal or thyroid mass is seen, but the lower poles of the thyroid gland are obscured by spray artifact from bilateral shoulder replacements. Disc levels: Multilevel moderate facet joint hypertrophy is noted, slightly greater on the left. There is mild cervical spondylosis. Mild disc space loss again at C4-5 and C5-6, with moderate disc space loss C7-T1. Other cervical discs maintained in height. No herniated discs, cord compromise or significant dorsal disc osteophyte complexes are seen. Again noted is moderate left and mild right foraminal stenosis at C3-4, C4-5 and C5-6. Other foramina remain clear. Upper chest: Atherosclerosis in the aortic arch and great vessels. No pneumothorax. Bilateral shoulder replacements. Otherwise negative. Other: None. IMPRESSION: 1. No acute intracranial CT findings or depressed skull fractures. 2. Chronic  changes.  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 3. No evidence of facial fractures. 4. Asymmetric swelling in the lower lip to the left. 5. No evidence of cervical fractures or traumatic listhesis. 6. Osteopenia, degenerative change and straightened lordosis. 7. Aortic and branch vessel atherosclerosis. Electronically Signed   By: Almira Bar M.D.   On: 08/06/2023 20:33   DG Humerus Right Result Date: 08/06/2023 CLINICAL DATA:  Fall with right humeral injury and deformity. EXAM: RIGHT HUMERUS - 2+ VIEW COMPARISON:  Right shoulder series 11/27/2021. FINDINGS: There is an acute closed comminuted oblique/spiral  mid shaft fracture of the right humerus, with a sizable butterfly fragment between the fracture margins and mild to moderate posterior and lateral angulation of the main distal fragment as well as apex medial angulation of the main proximal fracture component. Again noted is right shoulder replacement hardware. The fracture begins about 1 cm below the tip of the humeral hardware stem. Generalized osteopenia.  No proximal or distal dislocation is seen. There are small scattered dystrophic calcifications in the soft tissues laterally. IMPRESSION: 1. Acute closed comminuted oblique/spiral mid shaft fracture of the right humerus, with a sizable butterfly fragment between the fracture margins and mild to moderate posterior and lateral angulation of the main distal fragment, as well as apex medial angulation of the main proximal fracture component. 2. Osteopenia. 3. Right shoulder replacement hardware. Electronically Signed   By: Almira Bar M.D.   On: 08/06/2023 20:05   DG Knee Complete 4 Views Right Result Date: 07/29/2023 CLINICAL DATA:  Pain EXAM: RIGHT KNEE - COMPLETE 4+ VIEW COMPARISON:  06/13/2023 FINDINGS: Prior below-knee amputation of the right leg. Well-corticated resection margins. Prior right total knee arthroplasty and distal right femoral ORIF. Healed distal femoral fracture. Bones are  diffusely demineralized. No definite erosion. No fractures. Vascular calcifications. No focal soft tissue swelling is evident. IMPRESSION: Prior below-knee amputation of the right leg. No acute findings. Electronically Signed   By: Duanne Guess D.O.   On: 07/29/2023 17:27   CT ABDOMEN PELVIS W CONTRAST Result Date: 07/17/2023 CLINICAL DATA:  Abdominal pain. Decreased appetite. Patient reports diarrhea. Remote history of uterine cancer. EXAM: CT ABDOMEN AND PELVIS WITH CONTRAST TECHNIQUE: Multidetector CT imaging of the abdomen and pelvis was performed using the standard protocol following bolus administration of intravenous contrast. RADIATION DOSE REDUCTION: This exam was performed according to the departmental dose-optimization program which includes automated exposure control, adjustment of the mA and/or kV according to patient size and/or use of iterative reconstruction technique. CONTRAST:  OMNIPAQUE IOHEXOL 300 MG/ML  SOLN COMPARISON:  12/12/2022 FINDINGS: Lower chest: The lung bases are clear. Hepatobiliary: Improved biliary dilatation from prior. Post cholecystectomy. Focal fatty infiltration adjacent to the falciform ligament. No suspicious liver lesion. Pancreas: Parenchymal atrophy. No ductal dilatation or inflammation. Spleen: Normal in size without focal abnormality. Adrenals/Urinary Tract: No adrenal nodule. No hydronephrosis, renal calculi or renal inflammation. Tiny renal hypodensities are too small to characterize. No further follow-up imaging is recommended. Unremarkable urinary bladder. Stomach/Bowel: Decompressed stomach. No small bowel obstruction or inflammatory change. Normal appendix visualized. Fluid/liquid stool in the proximal colon. Small volume of formed stool in the distal colon. Scattered colonic diverticula. No diverticulitis, colonic wall thickening or obvious colonic mass. Vascular/Lymphatic: Aortic atherosclerosis. No aortic aneurysm. No acute vascular findings.  Partially included right femoral vascular stent. No abdominopelvic adenopathy. Reproductive: Status post hysterectomy. No adnexal masses. The ovaries are tentatively visualized and stable from prior. Other: No ascites.  No abdominal wall hernia. Musculoskeletal: Lumbar degenerative change. There are no acute or suspicious osseous abnormalities. IMPRESSION: 1. Fluid/liquid stool in the proximal colon, can be seen with diarrheal illness. No bowel obstruction or inflammation. 2. Scattered colonic diverticula without diverticulitis. 3. Improved biliary dilatation from prior. Aortic Atherosclerosis (ICD10-I70.0). Electronically Signed   By: Narda Rutherford M.D.   On: 07/17/2023 18:28     The results of significant diagnostics from this hospitalization (including imaging, microbiology, ancillary and laboratory) are listed below for reference.     Microbiology: Recent Results (from the past 240 hours)  Surgical pcr screen  Status: None   Collection Time: 08/10/23  8:23 PM   Specimen: Nasal Mucosa; Nasal Swab  Result Value Ref Range Status   MRSA, PCR NEGATIVE NEGATIVE Final   Staphylococcus aureus NEGATIVE NEGATIVE Final    Comment: (NOTE) The Xpert SA Assay (FDA approved for NASAL specimens in patients 25 years of age and older), is one component of a comprehensive surveillance program. It is not intended to diagnose infection nor to guide or monitor treatment. Performed at [facility], [address] [place], [place] [address]      Labs: BNP (last 3 results) No results for input(s): "BNP" in the last 8760 hours. Basic Metabolic Panel: Recent Labs  Lab 08/08/23 1524 08/09/23 0640 08/10/23 0713 08/12/23 0445 08/13/23 0456 08/14/23 0449 08/15/23 0607  NA 135 137 138 134* 135 137 137  K 3.6 3.2* 3.5 3.3* 4.4 3.6 3.6  CL 102 105 106 103 106 107 110  CO2 23 20* 20* 21* 19* 21* 20*  GLUCOSE 89 80 83 99 128* 85 81  BUN 5* <5* <5* 10 11 10 11   CREATININE 0.84 0.78 0.59 0.67  0.70 0.69 0.76  CALCIUM 9.3 8.6* 8.5* 8.5* 8.1* 8.4* 8.6*  MG 1.9 1.8 1.7  --   --   --   --   PHOS 2.9 2.9 2.7  --  4.7*  --   --    Liver Function Tests: Recent Labs  Lab 08/08/23 1524 08/09/23 0640 08/10/23 0713  AST 22  --   --   ALT 14  --   --   ALKPHOS 64  --   --   BILITOT 1.0  --   --   PROT 6.5  --   --   ALBUMIN 3.1* 2.6* 2.7*   No results for input(s): "LIPASE", "AMYLASE" in the last 168 hours. Recent Labs  Lab 08/08/23 1608  AMMONIA 41*   CBC: Recent Labs  Lab 08/09/23 0640 08/12/23 0445 08/13/23 0456 08/14/23 0449 08/15/23 0607  WBC 4.5 5.8 6.2 5.8 4.9  HGB 11.3* 12.7 11.3* 10.4* 11.0*  HCT 34.4* 38.4 33.9* 31.4* 34.7*  MCV 88.4 87.9 89.0 90.5 94.0  PLT 250 324 303 296 325   Cardiac Enzymes: Recent Labs  Lab 08/08/23 1524  CKTOTAL 262*   BNP: Invalid input(s): "POCBNP" CBG: No results for input(s): "GLUCAP" in the last 168 hours. D-Dimer No results for input(s): "DDIMER" in the last 72 hours. Hgb A1c No results for input(s): "HGBA1C" in the last 72 hours. Lipid Profile No results for input(s): "CHOL", "HDL", "LDLCALC", "TRIG", "CHOLHDL", "LDLDIRECT" in the last 72 hours. Thyroid function studies No results for input(s): "TSH", "T4TOTAL", "T3FREE", "THYROIDAB" in the last 72 hours.  Invalid input(s): "FREET3" Anemia work up No results for input(s): "VITAMINB12", "FOLATE", "FERRITIN", "TIBC", "IRON", "RETICCTPCT" in the last 72 hours. Urinalysis    Component Value Date/Time   COLORURINE YELLOW 08/08/2023 1521   APPEARANCEUR CLEAR 08/08/2023 1521   APPEARANCEUR Clear 12/15/2019 1017   LABSPEC 1.003 (L) 08/08/2023 1521   PHURINE 7.0 08/08/2023 1521   GLUCOSEU NEGATIVE 08/08/2023 1521   HGBUR SMALL (A) 08/08/2023 1521   BILIRUBINUR NEGATIVE 08/08/2023 1521   BILIRUBINUR Negative 08/08/2022 1633   BILIRUBINUR Negative 12/15/2019 1017   KETONESUR NEGATIVE 08/08/2023 1521   PROTEINUR NEGATIVE 08/08/2023 1521   UROBILINOGEN 0.2  08/08/2022 1633   NITRITE NEGATIVE 08/08/2023 1521   LEUKOCYTESUR NEGATIVE 08/08/2023 1521   Sepsis Labs Recent Labs  Lab 08/12/23 0445 08/13/23 0456 08/14/23 0449  08/15/23 0607  WBC 5.8 6.2 5.8 4.9   Microbiology Recent Results (from the past 240 hours)  Surgical pcr screen     Status: None   Collection Time: 08/10/23  8:23 PM   Specimen: Nasal Mucosa; Nasal Swab  Result Value Ref Range Status   MRSA, PCR NEGATIVE NEGATIVE Final   Staphylococcus aureus NEGATIVE NEGATIVE Final    Comment: (NOTE) The Xpert SA Assay (FDA approved for NASAL specimens in patients 4 years of age and older), is one component of a comprehensive surveillance program. It is not intended to diagnose infection nor to guide or monitor treatment. Performed at [facility], [address] [place], [place] [address]      Time coordinating discharge:  I have spent 35 minutes face to face with the patient and on the ward discussing the patients care, assessment, plan and disposition with other care givers. >50% of the time was devoted counseling the patient about the risks and benefits of treatment/Discharge disposition and coordinating care.   SIGNED:   Miguel Rota, MD  [facility] 08/15/2023, 10:42 AM   If 7PM-7AM, please contact night-coverage

## 2023-08-15 NOTE — Care Management Important Message (Signed)
 Important Message  Patient Details  Name: Sydney Evans MRN: [id number] Date of Birth: 1951-03-10   Important Message Given:  Yes - Medicare IM Patient left prior to IM delivery will mail a copy to the patient home address.     Tevin Shillingford 08/15/2023, 4:48 PM

## 2023-08-15 NOTE — TOC Transition Note (Signed)
 Transition of Care [facility]) - Discharge Note   Patient Details  Name: Sydney Evans MRN: [id number] Date of Birth: Apr 09, 1951  Transition of Care [facility]) CM/SW Contact:  Deatra Robinson, [place] Phone Number: 08/15/2023, 11:51 AM   Clinical Narrative: Pt for dc to [facility] today. Spoke to [name] in admissions who confirmed they are prepared to admit pt to room 506A. Pt and pt's dtr [name] aware of dc and report both report agreeable. RN provided with number for report and PTAR arranged for transport. SW signing off at dc.   Dellie Burns, MSW, LCSW [phone] (coverage)        Final next level of care: Skilled Nursing Facility Barriers to Discharge: Barriers Resolved   Patient Goals and CMS Choice Patient states their goals for this hospitalization and ongoing recovery are:: To go to rehab CMS Medicare.gov Compare Post Acute Care list provided to:: Patient Choice offered to / list presented to : Patient [place] ownership interest in [facility].provided to:: Patient    Discharge Placement              Patient chooses bed at: Other - please specify in the comment section below: [facility]) Patient to be transferred to facility by: PTAR Name of family member notified: [name]/Dtr Patient and family notified of of transfer: 08/15/23  Discharge Plan and Services Additional resources added to the After Visit Summary for                                       Social Drivers of Health (SDOH) Interventions SDOH Screenings   Food Insecurity: No Food Insecurity (08/08/2023)  Housing: Patient Unable To Answer (08/08/2023)  Transportation Needs: Patient Unable To Answer (08/08/2023)  Utilities: Patient Unable To Answer (08/08/2023)  Alcohol Screen: Low Risk  (09/09/2022)  Depression (PHQ2-9): Low Risk  (12/24/2022)  Financial Resource Strain: Low Risk  (02/15/2020)  Physical Activity: Insufficiently Active (11/26/2021)  Social Connections: Patient  Unable To Answer (08/08/2023)  Stress: Stress Concern Present (02/15/2020)  Tobacco Use: High Risk (08/12/2023)     Readmission Risk Interventions     No data to display

## 2023-08-15 NOTE — Progress Notes (Signed)
 Report given to Gardiner Barefoot, RN at [facility]. All questions/concerns were answered and addressed. AVS printed and dc packet is ready. Awaiting PTAR's arrival for transport.

## 2023-08-15 NOTE — Progress Notes (Signed)
 Occupational Therapy Treatment Patient Details Name: Sydney Evans MRN: [id number] DOB: December 20, 1950 Today's Date: 08/15/2023   History of present illness 73 y.o. female presenting 4/2 after fall from wheelchair. Found to have comminuted oblique/spiral midshaft fx of the R humerus. S/P ORIF to RUE on 08/12/23. PMH significant for CVA (with residual L hearing deficit), right BKA, HTN, anxiety, bipolar disorder, tobacco use disorder, hyperlipidemia, right shoulder replacement and GERD.   OT comments  Pt making slow but steady progress with adls and exercises with RUE to elbow wrist and hand only. Pt very limited with all mobility due to being RLE amputee and now unable to bear weight into RUE.  Pt stated before she fell and broke her arm, she was only eating one meal a day and that was a TV dinner bc she had become so weak she could no longer cook or properly take care of herself.  Pt would benefit from less than 3 hours a day to attempt to get pt back to baseline or better so she can safely live on her own. Pt stated she has not worn her prosthetic in months so essentially has done adls from her w/c level. Pt has an aid that comes 3x week just to help her bathe but otherwise is on her own unless daughters stop by or friend takes her for groceries. Will continue to see for ads and RUE rehab.      If plan is discharge home, recommend the following:  Two people to help with walking and/or transfers;Two people to help with bathing/dressing/bathroom;Assistance with cooking/housework;Assist for transportation;Help with stairs or ramp for entrance   Equipment Recommendations  Other (comment) (tbd)    Recommendations for Other Services      Precautions / Restrictions Precautions Precautions: Fall;Shoulder Shoulder Interventions: Shoulder sling/immobilizer;At all times Recall of Precautions/Restrictions: Impaired Precaution/Restrictions Comments: No PROM/AROM of shoulder. AROM elbow/wrist/hand. needs  reminders for ROM/WB restrictions Required Braces or Orthoses: Sling Restrictions Weight Bearing Restrictions Per Provider Order: Yes RUE Weight Bearing Per Provider Order: Non weight bearing Other Position/Activity Restrictions: Rt shoulder sling, NO active or passive R shoulder ROM       Mobility Bed Mobility Overal bed mobility: Needs Assistance Bed Mobility: Supine to Sit, Sit to Supine     Supine to sit: Min assist, HOB elevated Sit to supine: Min assist, HOB elevated   General bed mobility comments: Pt came to EOB but sitting in long sit first and then turning legs to EOB. Pt was able to scoot toward EOB with min assist from therapist pulling on pad    Transfers Overall transfer level: Needs assistance Equipment used: 2 person hand held assist Transfers: Sit to/from Stand, Bed to chair/wheelchair/BSC Sit to Stand: Min assist, +2 safety/equipment, From elevated surface Stand pivot transfers: Min assist, +2 physical assistance Squat pivot transfers: Min assist, +2 physical assistance, From elevated surface       General transfer comment: Squat pivot toward drop arm chair on her L side with +2 minA needing bed pad/gait belt lift and steadying assist. x3 small squat/scoots to reach chair from EOB with chair directly adjacent. Pt then able to stand from chair with L arm rest support for LUE; min assist blocking Rt side and gait belt lift assist while pillows/pads repositioned under her hips.     Balance Overall balance assessment: Needs assistance Sitting-balance support: No upper extremity supported, Feet supported Sitting balance-Leahy Scale: Good     Standing balance support: Single extremity supported Standing balance-Leahy Scale: Poor Standing  balance comment: minA for standing with LUE support but needs +2 for safety with squat pivots as pt does not have use of RUE currently and no prosthetic available at this time.                           ADL either  performed or assessed with clinical judgement   ADL Overall ADL's : Needs assistance/impaired                 Upper Body Dressing : Maximal assistance;Sitting Upper Body Dressing Details (indicate cue type and reason): educated pt on how to take sling off for exercises only and how to donn sling.     Toilet Transfer: Minimal assistance;+2 for physical assistance;+2 for safety/equipment;Squat-pivot Toilet Transfer Details (indicate cue type and reason): transfer to the Left Toileting- Clothing Manipulation and Hygiene: Moderate assistance;Sit to/from stand Toileting - Clothing Manipulation Details (indicate cue type and reason): Pt attempts to use RUE when in sling. Reminders given to use LUE only. Pt is R handed.     Functional mobility during ADLs: Moderate assistance General ADL Comments: Pt very limited due to pain in RUE, lack of recall of precautions regarding RUE, R BKA and fatigue/pain    Extremity/Trunk Assessment Upper Extremity Assessment Upper Extremity Assessment: RUE deficits/detail RUE Deficits / Details: No movement allowed in shoulder. Pt lacking 20 degrees extension in R elbow but can actively flex and extend. Hand very swollen so pt unable to fully make fist due to swelling. RUE: Unable to fully assess due to pain;Unable to fully assess due to immobilization RUE Sensation: decreased light touch RUE Coordination: decreased fine motor;decreased gross motor   Lower Extremity Assessment Lower Extremity Assessment: Defer to PT evaluation        Vision   Vision Assessment?: No apparent visual deficits   Perception     Praxis     Communication Communication Communication: Impaired Factors Affecting Communication: Hearing impaired   Cognition Arousal: Alert Behavior During Therapy: WFL for tasks assessed/performed Cognition: No family/caregiver present to determine baseline             OT - Cognition Comments: following basic commands and is  oriented, unable to recall events from day of surgery or OT/PT session this weekend. cues to avoid using RUE in a way that requires shoulder ROM                 Following commands: Intact        Cueing   Cueing Techniques: Verbal cues  Exercises Exercises: Shoulder Shoulder Exercises Elbow Flexion: AAROM, 10 reps, Right Elbow Extension: AAROM, Right, 10 reps Wrist Flexion: AROM, Right, 10 reps Wrist Extension: AAROM, Right, 10 reps Digit Composite Flexion: AROM, Right, 10 reps Composite Extension: AROM, Right, 10 reps Neck Flexion: AROM, 5 reps Neck Extension: AROM, 5 reps Neck Lateral Flexion - Right: AROM, 5 reps Neck Lateral Flexion - Left: AROM, 5 reps    Shoulder Instructions Shoulder Instructions Donning/doffing shirt without moving shoulder: Maximal assistance Method for sponge bathing under operated UE: Minimal assistance Donning/doffing sling/immobilizer: Maximal assistance Correct positioning of sling/immobilizer: Moderate assistance ROM for elbow, wrist and digits of operated UE: Minimal assistance Sling wearing schedule (on at all times/off for ADL's): Supervision/safety Proper positioning of operated UE when showering: Minimal assistance Positioning of UE while sleeping: Minimal assistance     General Comments Pt most limited by pain and how having little use of entire R side due  to being a RLE amputee as well as new R shoulder surgery    Pertinent Vitals/ Pain       Pain Assessment Pain Assessment: 0-10 Pain Score: 9  Pain Location: RUE Pain Descriptors / Indicators: Grimacing, Guarding Pain Intervention(s): Premedicated before session  Home Living                                          Prior Functioning/Environment              Frequency  Min 1X/week        Progress Toward Goals  OT Goals(current goals can now be found in the care plan section)  Progress towards OT goals: Progressing toward goals  Acute Rehab  OT Goals Patient Stated Goal: to get back home OT Goal Formulation: With patient Time For Goal Achievement: 08/23/23 Potential to Achieve Goals: Good ADL Goals Pt Will Perform Upper Body Bathing: with set-up;sitting Pt Will Perform Upper Body Dressing: with min assist;sitting Pt Will Perform Lower Body Dressing: with mod assist;sitting/lateral leans Pt Will Transfer to Toilet: with supervision;squat pivot transfer Pt/caregiver will Perform Home Exercise Program: Right Upper extremity;Increased ROM  Plan      Co-evaluation                 AM-PAC OT "6 Clicks" Daily Activity     Outcome Measure   Help from another person eating meals?: A Little Help from another person taking care of personal grooming?: A Little Help from another person toileting, which includes using toliet, bedpan, or urinal?: A Lot Help from another person bathing (including washing, rinsing, drying)?: A Lot Help from another person to put on and taking off regular upper body clothing?: A Lot Help from another person to put on and taking off regular lower body clothing?: A Lot 6 Click Score: 14    End of Session Equipment Utilized During Treatment: Gait belt;Other (comment)  OT Visit Diagnosis: Unsteadiness on feet (R26.81);Muscle weakness (generalized) (M62.81);Pain;Other abnormalities of gait and mobility (R26.89);History of falling (Z91.81) Pain - Right/Left: Right Pain - part of body: Shoulder;Arm   Activity Tolerance Patient tolerated treatment well   Patient Left with call bell/phone within reach;in bed;with bed alarm set   Nurse Communication Mobility status        Time: 1478-2956 OT Time Calculation (min): 22 min  Charges: OT General Charges $OT Visit: 1 Visit OT Treatments $Self Care/Home Management : 8-22 mins   Hope Budds 08/15/2023, 11:47 AM

## 2023-08-18 DIAGNOSIS — F419 Anxiety disorder, unspecified: Secondary | ICD-10-CM | POA: Diagnosis not present

## 2023-08-18 DIAGNOSIS — S42291D Other displaced fracture of upper end of right humerus, subsequent encounter for fracture with routine healing: Secondary | ICD-10-CM | POA: Diagnosis not present

## 2023-08-18 DIAGNOSIS — Z8541 Personal history of malignant neoplasm of cervix uteri: Secondary | ICD-10-CM | POA: Diagnosis not present

## 2023-08-18 DIAGNOSIS — F319 Bipolar disorder, unspecified: Secondary | ICD-10-CM | POA: Diagnosis not present

## 2023-08-18 DIAGNOSIS — Z89511 Acquired absence of right leg below knee: Secondary | ICD-10-CM | POA: Diagnosis not present

## 2023-08-18 DIAGNOSIS — K219 Gastro-esophageal reflux disease without esophagitis: Secondary | ICD-10-CM | POA: Diagnosis not present

## 2023-08-18 DIAGNOSIS — E785 Hyperlipidemia, unspecified: Secondary | ICD-10-CM | POA: Diagnosis not present

## 2023-08-18 DIAGNOSIS — Z8673 Personal history of transient ischemic attack (TIA), and cerebral infarction without residual deficits: Secondary | ICD-10-CM | POA: Diagnosis not present

## 2023-08-18 DIAGNOSIS — R001 Bradycardia, unspecified: Secondary | ICD-10-CM | POA: Diagnosis not present

## 2023-08-18 DIAGNOSIS — I10 Essential (primary) hypertension: Secondary | ICD-10-CM | POA: Diagnosis not present

## 2023-08-18 DIAGNOSIS — F17200 Nicotine dependence, unspecified, uncomplicated: Secondary | ICD-10-CM | POA: Diagnosis not present

## 2023-08-18 DIAGNOSIS — Z96611 Presence of right artificial shoulder joint: Secondary | ICD-10-CM | POA: Diagnosis not present

## 2023-08-19 ENCOUNTER — Telehealth: Payer: Self-pay

## 2023-08-19 NOTE — Telephone Encounter (Signed)
 I have looked through the chart and cannot find anything about this. Hopefully Dr Cherlyn Cornet knows something about it.

## 2023-08-19 NOTE — Telephone Encounter (Signed)
 Okay to right a letter stating she has a dog that she needs for emotional support. If she needs it before Thursday.. stamp it and send/mail/have ready for pick up for patient.

## 2023-08-19 NOTE — Telephone Encounter (Signed)
 Copied from CRM [id number]. Topic: General - Other >> Aug 19, 2023  2:56 PM Howard Macho wrote: Reason for CRM: patient called stating that this was the third time that she has called concerning her dog be medically necessary and patient would like a note sent to [facility] regarding the approval

## 2023-08-22 DIAGNOSIS — G9340 Encephalopathy, unspecified: Secondary | ICD-10-CM | POA: Diagnosis not present

## 2023-08-26 DIAGNOSIS — F419 Anxiety disorder, unspecified: Secondary | ICD-10-CM | POA: Diagnosis not present

## 2023-08-26 DIAGNOSIS — F319 Bipolar disorder, unspecified: Secondary | ICD-10-CM | POA: Diagnosis not present

## 2023-08-26 DIAGNOSIS — S42291D Other displaced fracture of upper end of right humerus, subsequent encounter for fracture with routine healing: Secondary | ICD-10-CM | POA: Diagnosis not present

## 2023-08-26 DIAGNOSIS — Z89511 Acquired absence of right leg below knee: Secondary | ICD-10-CM | POA: Diagnosis not present

## 2023-08-26 DIAGNOSIS — Z96611 Presence of right artificial shoulder joint: Secondary | ICD-10-CM | POA: Diagnosis not present

## 2023-08-26 DIAGNOSIS — K219 Gastro-esophageal reflux disease without esophagitis: Secondary | ICD-10-CM | POA: Diagnosis not present

## 2023-08-26 DIAGNOSIS — Z8673 Personal history of transient ischemic attack (TIA), and cerebral infarction without residual deficits: Secondary | ICD-10-CM | POA: Diagnosis not present

## 2023-08-26 DIAGNOSIS — Z8541 Personal history of malignant neoplasm of cervix uteri: Secondary | ICD-10-CM | POA: Diagnosis not present

## 2023-08-26 DIAGNOSIS — F17200 Nicotine dependence, unspecified, uncomplicated: Secondary | ICD-10-CM | POA: Diagnosis not present

## 2023-08-26 DIAGNOSIS — E785 Hyperlipidemia, unspecified: Secondary | ICD-10-CM | POA: Diagnosis not present

## 2023-08-26 DIAGNOSIS — I10 Essential (primary) hypertension: Secondary | ICD-10-CM | POA: Diagnosis not present

## 2023-08-26 DIAGNOSIS — R001 Bradycardia, unspecified: Secondary | ICD-10-CM | POA: Diagnosis not present

## 2023-08-28 DIAGNOSIS — F4323 Adjustment disorder with mixed anxiety and depressed mood: Secondary | ICD-10-CM | POA: Diagnosis not present

## 2023-08-29 ENCOUNTER — Telehealth: Payer: Self-pay

## 2023-08-29 ENCOUNTER — Telehealth: Payer: Self-pay | Admitting: Family Medicine

## 2023-08-29 DIAGNOSIS — Z89511 Acquired absence of right leg below knee: Secondary | ICD-10-CM

## 2023-08-29 DIAGNOSIS — S42291A Other displaced fracture of upper end of right humerus, initial encounter for closed fracture: Secondary | ICD-10-CM | POA: Diagnosis not present

## 2023-08-29 NOTE — Telephone Encounter (Signed)
 I have not seen any orders come through on Sydney Evans for OT/PT.  Only fax number was provided so I am not able to return call to [facility] with [facility] to request orders.

## 2023-08-29 NOTE — Telephone Encounter (Signed)
 Please clarify.  My understanding is she would need a new home health referral for PT/OT but if she still in rehab after her hospitalization?  If she needs a home health referral I believe I need to see her to get this covered.  I have not seen her since the hospitalization. Alternatively if she is in rehab maybe I can just send a written prescription requesting OT PT.

## 2023-08-29 NOTE — Transitions of Care (Post Inpatient/ED Visit) (Signed)
   08/29/2023  Name: Sydney Evans MRN: [id number] DOB: 29-Aug-1950  Today's TOC FU Call Status: Today's TOC FU Call Status:: Unsuccessful Call (1st Attempt) Unsuccessful Call (1st Attempt) Date: 08/29/23  Attempted to reach the patient regarding the most recent Inpatient/ED visit.  Follow Up Plan: Additional outreach attempts will be made to reach the patient to complete the Transitions of Care (Post Inpatient/ED visit) call.   Signature Darrall Ellison, LPN [facility] Nurse Health Advisor Direct Dial [phone]

## 2023-08-29 NOTE — Addendum Note (Signed)
 Addended by: Herby Lolling E on: 08/29/2023 04:54 PM   Modules accepted: Orders

## 2023-08-29 NOTE — Telephone Encounter (Signed)
 Copied from CRM [id number]. Topic: General - Other >> Aug 29, 2023  1:06 PM Deaijah H wrote: Reason for CRM: Louanna Rouse w/ [facility] called in to verify if patient was an active patient advised yes. Needs an whole new order for PT/OT start of care date 09/02/2023 with Dr. Cherlyn Cornet signature. Please fax to [phone].

## 2023-08-29 NOTE — Telephone Encounter (Signed)
 Order placed

## 2023-08-29 NOTE — Telephone Encounter (Signed)
  Call pt and scheuelde appt for f/u     Copied from CRM [id number]. Topic: General - Other >> Aug 29, 2023  2:47 PM Freya Jesus wrote: Reason for CRM: Carmelina Chinchilla from [facility] is calling to advise provider that patient requested to reschedule appointment for Monday. Call back: [phone] opt 2.

## 2023-08-29 NOTE — Telephone Encounter (Signed)
 Copied from CRM [id number]. Topic: General - Other >> Aug 28, 2023  4:53 PM Alyse July wrote: Reason for CRM: Patient is requesting a order for DME for 4 prong medal cane be submitted.

## 2023-09-01 NOTE — Telephone Encounter (Signed)
 Called [facility] and they were able to provide phone number for [facility].  I spoke with Louanna Rouse.  They received orders for [facility] but when they called to clarify orders with them,  they were advised patient had been discharged home. So then it falls back on the PCP.  They are just requesting written orders on Rx pad for PT/OT with start date of care of 09/05/2023.  They said they could except orders without a hospital follow up appointment.

## 2023-09-01 NOTE — Transitions of Care (Post Inpatient/ED Visit) (Unsigned)
   09/01/2023  Name: Sydney Evans MRN: [id number] DOB: July 02, 1950  Today's TOC FU Call Status: Today's TOC FU Call Status:: Unsuccessful Call (2nd Attempt) Unsuccessful Call (1st Attempt) Date: 08/29/23 Unsuccessful Call (2nd Attempt) Date: 09/01/23  Attempted to reach the patient regarding the most recent Inpatient/ED visit.  Follow Up Plan: Additional outreach attempts will be made to reach the patient to complete the Transitions of Care (Post Inpatient/ED visit) call.   Signature Darrall Ellison, LPN [facility] Nurse Health Advisor Direct Dial [phone]

## 2023-09-01 NOTE — Telephone Encounter (Signed)
 Community message sent to [employer] about DME Jeananne Mighty order in [place].

## 2023-09-02 ENCOUNTER — Telehealth: Payer: Self-pay | Admitting: Family Medicine

## 2023-09-02 ENCOUNTER — Other Ambulatory Visit: Payer: Self-pay | Admitting: Family Medicine

## 2023-09-02 DIAGNOSIS — R296 Repeated falls: Secondary | ICD-10-CM

## 2023-09-02 DIAGNOSIS — Z89511 Acquired absence of right leg below knee: Secondary | ICD-10-CM

## 2023-09-02 DIAGNOSIS — R531 Weakness: Secondary | ICD-10-CM

## 2023-09-02 NOTE — Telephone Encounter (Signed)
 Copied from CRM [id number]. Topic: Clinical - Home Health Verbal Orders >> Sep 02, 2023 12:07 PM Adonis Hoot wrote: Caller/Agency: [facility] Callback Number: [phone] Service Requested: Physical Therapy Frequency: 1wk 5 Any new concerns about the patient? No

## 2023-09-02 NOTE — Transitions of Care (Post Inpatient/ED Visit) (Signed)
   09/02/2023  Name: Sydney Evans MRN: [id number] DOB: March 06, 1951  Today's TOC FU Call Status: Today's TOC FU Call Status:: Unsuccessful Call (3rd Attempt) Unsuccessful Call (1st Attempt) Date: 08/29/23 Unsuccessful Call (2nd Attempt) Date: 09/01/23 Unsuccessful Call (3rd Attempt) Date: 09/02/23  Attempted to reach the patient regarding the most recent Inpatient/ED visit.  Follow Up Plan: No further outreach attempts will be made at this time. We have been unable to contact the patient.  Signature Darrall Ellison, LPN [facility] Nurse Health Advisor Direct Dial [phone]

## 2023-09-02 NOTE — Telephone Encounter (Signed)
 PT/OT orders faxed to [facility] at [phone]

## 2023-09-02 NOTE — Telephone Encounter (Signed)
 Spoke with Sydney Evans at [facility] and advised him that [facility] has contacted as well and orders were faxed in to them this morning for PT/OT.  He will look into this to see if he can find out what [facility] agency will be going out to see patient and he will call us  back for orders if needed.

## 2023-09-04 ENCOUNTER — Inpatient Hospital Stay: Admitting: Family Medicine

## 2023-09-08 ENCOUNTER — Telehealth: Payer: Self-pay | Admitting: Family Medicine

## 2023-09-08 ENCOUNTER — Encounter: Payer: Self-pay | Admitting: Internal Medicine

## 2023-09-08 NOTE — Telephone Encounter (Signed)
 Copied from CRM [id number]. Topic: Clinical - Home Health Verbal Orders >> Sep 08, 2023  1:59 PM Laird Pih wrote: Caller/Agency: [facility]  Callback Number: [phone] Service Requested: Occupational Therapy Frequency: 1 week 1, 2 week 7  Any new concerns about the patient? No

## 2023-09-09 ENCOUNTER — Ambulatory Visit (INDEPENDENT_AMBULATORY_CARE_PROVIDER_SITE_OTHER)
Admission: RE | Admit: 2023-09-09 | Discharge: 2023-09-09 | Disposition: A | Discharge status: Home / Self Care | Source: Ambulatory Visit | Attending: Family Medicine | Admitting: Family Medicine

## 2023-09-09 ENCOUNTER — Encounter: Payer: Self-pay | Admitting: Internal Medicine

## 2023-09-09 ENCOUNTER — Encounter: Payer: Self-pay | Admitting: Family Medicine

## 2023-09-09 ENCOUNTER — Ambulatory Visit (INDEPENDENT_AMBULATORY_CARE_PROVIDER_SITE_OTHER): Admitting: Family Medicine

## 2023-09-09 VITALS — BP 150/72 | HR 64 | Temp 97.4°F

## 2023-09-09 DIAGNOSIS — R296 Repeated falls: Secondary | ICD-10-CM | POA: Diagnosis not present

## 2023-09-09 DIAGNOSIS — M79601 Pain in right arm: Secondary | ICD-10-CM

## 2023-09-09 DIAGNOSIS — M79641 Pain in right hand: Secondary | ICD-10-CM

## 2023-09-09 MED ORDER — HYDROCODONE-ACETAMINOPHEN 5-325 MG PO TABS: 1.0000 | ORAL_TABLET | Freq: Four times a day (QID) | ORAL | 0 refills | Status: AC | PRN

## 2023-09-09 NOTE — Progress Notes (Signed)
 Patient ID: Sydney Evans, female    DOB: 02-24-1951, 73 y.o.   MRN: [id number]  This visit was conducted in person.  BP (!) 150/72   Pulse 64   Temp (!) 97.4 F (36.3 C) (Temporal)   SpO2 98%    CC:  Chief Complaint  Patient presents with   Arm Pain    Radiate down to hand   Sydney Evans out of wheelchair a week ago when she went to pet a dog    Subjective:   HPI: Sydney Evans is a 73 y.o. female presenting on 09/09/2023 for Arm Pain (Radiate down to hand) and Fall Sydney Evans out of wheelchair a week ago when she went to pet a dog)   Recent admission for frequent falls and resulting  right humeral fracture, displaced.  S/P surgical repair. Dr. Abigail Abler.  Went rehab facility... now home from rehab since 08/28/2023  1 week ago feel out of chair again.Aaron Aas gradually worsening pain in right thumb and upper arm.  Was treated with oxycodone  1/2 tablet 5 mg  every 4 hours ... was helping some until last 3-4 days... so stopped, now using ibuprofen   400 mg every three hours.  Minimal SE   Some swelling  Has follow up with Ortho 09/01/2023   Pain keeping her up at night, nausea.      Relevant past medical, surgical, family and social history reviewed and updated as indicated. Interim medical history since our last visit reviewed. Allergies and medications reviewed and updated. Outpatient Medications Prior to Visit  Medication Sig Dispense Refill   albuterol  (VENTOLIN  HFA) 108 (90 Base) MCG/ACT inhaler INHALE 2 PUFFS BY MOUTH EVERY 4 HOURS AS NEEDED FOR SHORTNESS OF BREATH 9 g 0   amLODipine  (NORVASC ) 10 MG tablet Take 1 tablet (10 mg total) by mouth daily.     atorvastatin  (LIPITOR) 20 MG tablet Take 20 mg by mouth daily.     carboxymethylcellulose (LUBRICANT EYE DROPS) 0.5 % SOLN Place 2 drops into both eyes daily as needed (Dry eye). Systane 30 mL 3   DULoxetine  (CYMBALTA ) 60 MG capsule Take 1 capsule (60 mg total) by mouth daily. 30 capsule 3   fluticasone  (FLONASE ) 50 MCG/ACT nasal  spray Place 1 spray into both nostrils daily as needed for allergies or rhinitis. 16 g 5   gabapentin  (NEURONTIN ) 100 MG capsule Take 2 capsules (200 mg total) by mouth 2 (two) times daily.     lamoTRIgine  (LAMICTAL ) 200 MG tablet Take 1 tablet (200 mg total) by mouth daily. 90 tablet 0   pantoprazole  (PROTONIX ) 40 MG tablet Take 1 tablet by mouth twice daily 180 tablet 1   acetaminophen  (TYLENOL ) 500 MG tablet Take 500 mg by mouth every 6 (six) hours as needed.     aspirin  EC 81 MG tablet Take 1 tablet (81 mg total) by mouth 2 (two) times daily. To prevent blood clots for 30 days after surgery. 60 tablet 0   bisacodyl  (DULCOLAX) 10 MG suppository Place 1 suppository (10 mg total) rectally daily as needed for severe constipation.     docusate sodium  (COLACE) 100 MG capsule Take 1 capsule (100 mg total) by mouth 2 (two) times daily.     latanoprost  (XALATAN ) 0.005 % ophthalmic solution Place 1 drop into both eyes at bedtime. 2.5 mL 5   senna-docusate (SENOKOT-S) 8.6-50 MG tablet Take 1 tablet by mouth 2 (two) times daily as needed for moderate constipation.     atorvastatin  (LIPITOR) 40  MG tablet Take 0.5 tablets (20 mg total) by mouth daily. 90 tablet 3   oxyCODONE  (ROXICODONE ) 5 MG immediate release tablet Take 1 tablet (5 mg total) by mouth every 4 (four) hours as needed for severe pain (pain score 7-10). (Patient not taking: Reported on 09/09/2023) 30 tablet 0   No facility-administered medications prior to visit.     Per HPI unless specifically indicated in ROS section below Review of Systems  Constitutional:  Negative for fatigue and fever.  HENT:  Negative for congestion.   Eyes:  Negative for pain.  Respiratory:  Negative for cough and shortness of breath.   Cardiovascular:  Negative for chest pain, palpitations and leg swelling.  Gastrointestinal:  Negative for abdominal pain.  Genitourinary:  Negative for dysuria and vaginal bleeding.  Musculoskeletal:  Negative for back pain.   Neurological:  Negative for syncope, light-headedness and headaches.  Psychiatric/Behavioral:  Negative for dysphoric mood.    Objective:  BP (!) 150/72   Pulse 64   Temp (!) 97.4 F (36.3 C) (Temporal)   SpO2 98%   Wt Readings from Last 3 Encounters:  08/14/23 152 lb 12.5 oz (69.3 kg)  08/06/23 150 lb (68 kg)  03/14/23 167 lb (75.8 kg)      Physical Exam Constitutional:      General: She is not in acute distress.    Appearance: Normal appearance. She is well-developed. She is not ill-appearing or toxic-appearing.  HENT:     Head: Normocephalic.     Right Ear: Hearing, tympanic membrane, ear canal and external ear normal. Tympanic membrane is not erythematous, retracted or bulging.     Left Ear: Hearing, tympanic membrane, ear canal and external ear normal. Tympanic membrane is not erythematous, retracted or bulging.     Nose: No mucosal edema or rhinorrhea.     Right Sinus: No maxillary sinus tenderness or frontal sinus tenderness.     Left Sinus: No maxillary sinus tenderness or frontal sinus tenderness.     Mouth/Throat:     Pharynx: Uvula midline.  Eyes:     General: Lids are normal. Lids are everted, no foreign bodies appreciated.     Conjunctiva/sclera: Conjunctivae normal.     Pupils: Pupils are equal, round, and reactive to light.  Neck:     Thyroid : No thyroid  mass or thyromegaly.     Vascular: No carotid bruit.     Trachea: Trachea normal.  Cardiovascular:     Rate and Rhythm: Normal rate and regular rhythm.     Pulses: Normal pulses.     Heart sounds: Normal heart sounds, S1 normal and S2 normal. No murmur heard.    No friction rub. No gallop.  Pulmonary:     Effort: Pulmonary effort is normal. No tachypnea or respiratory distress.     Breath sounds: Normal breath sounds. No decreased breath sounds, wheezing, rhonchi or rales.  Abdominal:     General: Bowel sounds are normal.     Palpations: Abdomen is soft.     Tenderness: There is no abdominal tenderness.   Musculoskeletal:     Right shoulder: Swelling, tenderness and bony tenderness present. Decreased range of motion.     Right hand: Swelling, tenderness and bony tenderness present. No deformity. Decreased range of motion.     Cervical back: Normal range of motion and neck supple.     Comments:  In sling     Right Lower Extremity: Right leg is amputated below knee.  Skin:    General:  Skin is warm and dry.     Findings: No rash.  Neurological:     Mental Status: She is alert.  Psychiatric:        Mood and Affect: Mood is not anxious or depressed.        Speech: Speech normal.        Behavior: Behavior normal. Behavior is cooperative.        Thought Content: Thought content normal.        Judgment: Judgment normal.       Results for orders placed or performed during the hospital encounter of 08/08/23  Rapid urine drug screen (hospital performed)   Collection Time: 08/08/23  3:15 PM  Result Value Ref Range   Opiates NONE DETECTED NONE DETECTED   Cocaine NONE DETECTED NONE DETECTED   Benzodiazepines NONE DETECTED NONE DETECTED   Amphetamines NONE DETECTED NONE DETECTED   Tetrahydrocannabinol NONE DETECTED NONE DETECTED   Barbiturates NONE DETECTED NONE DETECTED  Urinalysis, Routine w reflex microscopic -Urine, Clean Catch   Collection Time: 08/08/23  3:21 PM  Result Value Ref Range   Color, Urine YELLOW YELLOW   APPearance CLEAR CLEAR   Specific Gravity, Urine 1.003 (L) 1.005 - 1.030   pH 7.0 5.0 - 8.0   Glucose, UA NEGATIVE NEGATIVE mg/dL   Hgb urine dipstick SMALL (A) NEGATIVE   Bilirubin Urine NEGATIVE NEGATIVE   Ketones, ur NEGATIVE NEGATIVE mg/dL   Protein, ur NEGATIVE NEGATIVE mg/dL   Nitrite NEGATIVE NEGATIVE   Leukocytes,Ua NEGATIVE NEGATIVE   RBC / HPF 0-5 0 - 5 RBC/hpf   WBC, UA 0-5 0 - 5 WBC/hpf   Bacteria, UA NONE SEEN NONE SEEN   Squamous Epithelial / HPF 0-5 0 - 5 /HPF  Comprehensive metabolic panel with GFR   Collection Time: 08/08/23  3:24 PM  Result  Value Ref Range   Sodium 135 135 - 145 mmol/L   Potassium 3.6 3.5 - 5.1 mmol/L   Chloride 102 98 - 111 mmol/L   CO2 23 22 - 32 mmol/L   Glucose, Bld 89 70 - 99 mg/dL   BUN 5 (L) 8 - 23 mg/dL   Creatinine, Ser 1.61 0.44 - 1.00 mg/dL   Calcium  9.3 8.9 - 10.3 mg/dL   Total Protein 6.5 6.5 - 8.1 g/dL   Albumin 3.1 (L) 3.5 - 5.0 g/dL   AST 22 15 - 41 U/L   ALT 14 0 - 44 U/L   Alkaline Phosphatase 64 38 - 126 U/L   Total Bilirubin 1.0 0.0 - 1.2 mg/dL   GFR, Estimated >09 >60 mL/min   Anion gap 10 5 - 15  CK   Collection Time: 08/08/23  3:24 PM  Result Value Ref Range   Total CK 262 (H) 38 - 234 U/L  CBC   Collection Time: 08/08/23  3:24 PM  Result Value Ref Range   WBC 5.1 4.0 - 10.5 K/uL   RBC 4.06 3.87 - 5.11 MIL/uL   Hemoglobin 12.0 12.0 - 15.0 g/dL   HCT 45.4 (L) 09.8 - 11.9 %   MCV 87.4 80.0 - 100.0 fL   MCH 29.6 26.0 - 34.0 pg   MCHC 33.8 30.0 - 36.0 g/dL   RDW 14.7 82.9 - 56.2 %   Platelets 262 150 - 400 K/uL   nRBC 0.0 0.0 - 0.2 %  Phosphorus   Collection Time: 08/08/23  3:24 PM  Result Value Ref Range   Phosphorus 2.9 2.5 - 4.6 mg/dL  Magnesium   Collection Time: 08/08/23  3:24 PM  Result Value Ref Range   Magnesium  1.9 1.7 - 2.4 mg/dL  VITAMIN D  25 Hydroxy (Vit-D Deficiency, Fractures)   Collection Time: 08/08/23  3:24 PM  Result Value Ref Range   Vit D, 25-Hydroxy 44.01 30 - 100 ng/mL  HIV Antibody (routine testing w rflx)   Collection Time: 08/08/23  4:08 PM  Result Value Ref Range   HIV Screen 4th Generation wRfx Non Reactive Non Reactive  TSH   Collection Time: 08/08/23  4:08 PM  Result Value Ref Range   TSH 1.781 0.350 - 4.500 uIU/mL  RPR   Collection Time: 08/08/23  4:08 PM  Result Value Ref Range   RPR Ser Ql NON REACTIVE NON REACTIVE  Vitamin B12   Collection Time: 08/08/23  4:08 PM  Result Value Ref Range   Vitamin B-12 1,342 (H) 180 - 914 pg/mL  Ammonia   Collection Time: 08/08/23  4:08 PM  Result Value Ref Range   Ammonia 41 (H) 9 - 35  umol/L  Blood gas, venous   Collection Time: 08/08/23  4:56 PM  Result Value Ref Range   pH, Ven 7.35 7.25 - 7.43   pCO2, Ven 43 (L) 44 - 60 mmHg   pO2, Ven 34 32 - 45 mmHg   Bicarbonate 24.3 20.0 - 28.0 mmol/L   Acid-base deficit 1.5 0.0 - 2.0 mmol/L   O2 Saturation 60.2 %   Patient temperature 36.7    Collection site LEFT ARM    Drawn by Forbes Ida   Renal function panel   Collection Time: 08/09/23  6:40 AM  Result Value Ref Range   Sodium 137 135 - 145 mmol/L   Potassium 3.2 (L) 3.5 - 5.1 mmol/L   Chloride 105 98 - 111 mmol/L   CO2 20 (L) 22 - 32 mmol/L   Glucose, Bld 80 70 - 99 mg/dL   BUN <5 (L) 8 - 23 mg/dL   Creatinine, Ser 7.84 0.44 - 1.00 mg/dL   Calcium  8.6 (L) 8.9 - 10.3 mg/dL   Phosphorus 2.9 2.5 - 4.6 mg/dL   Albumin 2.6 (L) 3.5 - 5.0 g/dL   GFR, Estimated >69 >62 mL/min   Anion gap 12 5 - 15  Magnesium    Collection Time: 08/09/23  6:40 AM  Result Value Ref Range   Magnesium  1.8 1.7 - 2.4 mg/dL  CBC   Collection Time: 08/09/23  6:40 AM  Result Value Ref Range   WBC 4.5 4.0 - 10.5 K/uL   RBC 3.89 3.87 - 5.11 MIL/uL   Hemoglobin 11.3 (L) 12.0 - 15.0 g/dL   HCT 95.2 (L) 84.1 - 32.4 %   MCV 88.4 80.0 - 100.0 fL   MCH 29.0 26.0 - 34.0 pg   MCHC 32.8 30.0 - 36.0 g/dL   RDW 40.1 02.7 - 25.3 %   Platelets 250 150 - 400 K/uL   nRBC 0.0 0.0 - 0.2 %  Renal function panel   Collection Time: 08/10/23  7:13 AM  Result Value Ref Range   Sodium 138 135 - 145 mmol/L   Potassium 3.5 3.5 - 5.1 mmol/L   Chloride 106 98 - 111 mmol/L   CO2 20 (L) 22 - 32 mmol/L   Glucose, Bld 83 70 - 99 mg/dL   BUN <5 (L) 8 - 23 mg/dL   Creatinine, Ser 6.64 0.44 - 1.00 mg/dL   Calcium  8.5 (L) 8.9 - 10.3 mg/dL   Phosphorus 2.7 2.5 - 4.6 mg/dL  Albumin 2.7 (L) 3.5 - 5.0 g/dL   GFR, Estimated >64 >33 mL/min   Anion gap 12 5 - 15  Magnesium    Collection Time: 08/10/23  7:13 AM  Result Value Ref Range   Magnesium  1.7 1.7 - 2.4 mg/dL  Surgical pcr screen   Collection Time:  08/10/23  8:23 PM   Specimen: Nasal Mucosa; Nasal Swab  Result Value Ref Range   MRSA, PCR NEGATIVE NEGATIVE   Staphylococcus aureus NEGATIVE NEGATIVE  CBC   Collection Time: 08/12/23  4:45 AM  Result Value Ref Range   WBC 5.8 4.0 - 10.5 K/uL   RBC 4.37 3.87 - 5.11 MIL/uL   Hemoglobin 12.7 12.0 - 15.0 g/dL   HCT 29.5 18.8 - 41.6 %   MCV 87.9 80.0 - 100.0 fL   MCH 29.1 26.0 - 34.0 pg   MCHC 33.1 30.0 - 36.0 g/dL   RDW 60.6 30.1 - 60.1 %   Platelets 324 150 - 400 K/uL   nRBC 0.0 0.0 - 0.2 %  Basic metabolic panel with GFR   Collection Time: 08/12/23  4:45 AM  Result Value Ref Range   Sodium 134 (L) 135 - 145 mmol/L   Potassium 3.3 (L) 3.5 - 5.1 mmol/L   Chloride 103 98 - 111 mmol/L   CO2 21 (L) 22 - 32 mmol/L   Glucose, Bld 99 70 - 99 mg/dL   BUN 10 8 - 23 mg/dL   Creatinine, Ser 0.93 0.44 - 1.00 mg/dL   Calcium  8.5 (L) 8.9 - 10.3 mg/dL   GFR, Estimated >23 >55 mL/min   Anion gap 10 5 - 15  No blood products   Collection Time: 08/12/23  9:30 AM  Result Value Ref Range   Transfuse no blood products      TRANSFUSE NO BLOOD PRODUCTS, VERIFIED BY CARESON HOWELL RN AT 0930 4.8.25 Performed at [facility], [address] [place], [place] [address]   CBC   Collection Time: 08/13/23  4:56 AM  Result Value Ref Range   WBC 6.2 4.0 - 10.5 K/uL   RBC 3.81 (L) 3.87 - 5.11 MIL/uL   Hemoglobin 11.3 (L) 12.0 - 15.0 g/dL   HCT 25.4 (L) 27.0 - 62.3 %   MCV 89.0 80.0 - 100.0 fL   MCH 29.7 26.0 - 34.0 pg   MCHC 33.3 30.0 - 36.0 g/dL   RDW 76.2 83.1 - 51.7 %   Platelets 303 150 - 400 K/uL   nRBC 0.0 0.0 - 0.2 %  Basic metabolic panel with GFR   Collection Time: 08/13/23  4:56 AM  Result Value Ref Range   Sodium 135 135 - 145 mmol/L   Potassium 4.4 3.5 - 5.1 mmol/L   Chloride 106 98 - 111 mmol/L   CO2 19 (L) 22 - 32 mmol/L   Glucose, Bld 128 (H) 70 - 99 mg/dL   BUN 11 8 - 23 mg/dL   Creatinine, Ser 6.16 0.44 - 1.00 mg/dL   Calcium  8.1 (L) 8.9 - 10.3 mg/dL   GFR,  Estimated >07 >37 mL/min   Anion gap 10 5 - 15  Phosphorus   Collection Time: 08/13/23  4:56 AM  Result Value Ref Range   Phosphorus 4.7 (H) 2.5 - 4.6 mg/dL  Basic metabolic panel with GFR   Collection Time: 08/14/23  4:49 AM  Result Value Ref Range   Sodium 137 135 - 145 mmol/L   Potassium 3.6 3.5 - 5.1 mmol/L   Chloride 107  98 - 111 mmol/L   CO2 21 (L) 22 - 32 mmol/L   Glucose, Bld 85 70 - 99 mg/dL   BUN 10 8 - 23 mg/dL   Creatinine, Ser 8.11 0.44 - 1.00 mg/dL   Calcium  8.4 (L) 8.9 - 10.3 mg/dL   GFR, Estimated >91 >47 mL/min   Anion gap 9 5 - 15  CBC   Collection Time: 08/14/23  4:49 AM  Result Value Ref Range   WBC 5.8 4.0 - 10.5 K/uL   RBC 3.47 (L) 3.87 - 5.11 MIL/uL   Hemoglobin 10.4 (L) 12.0 - 15.0 g/dL   HCT 82.9 (L) 56.2 - 13.0 %   MCV 90.5 80.0 - 100.0 fL   MCH 30.0 26.0 - 34.0 pg   MCHC 33.1 30.0 - 36.0 g/dL   RDW 86.5 78.4 - 69.6 %   Platelets 296 150 - 400 K/uL   nRBC 0.0 0.0 - 0.2 %  Basic metabolic panel with GFR   Collection Time: 08/15/23  6:07 AM  Result Value Ref Range   Sodium 137 135 - 145 mmol/L   Potassium 3.6 3.5 - 5.1 mmol/L   Chloride 110 98 - 111 mmol/L   CO2 20 (L) 22 - 32 mmol/L   Glucose, Bld 81 70 - 99 mg/dL   BUN 11 8 - 23 mg/dL   Creatinine, Ser 2.95 0.44 - 1.00 mg/dL   Calcium  8.6 (L) 8.9 - 10.3 mg/dL   GFR, Estimated >28 >41 mL/min   Anion gap 7 5 - 15  CBC   Collection Time: 08/15/23  6:07 AM  Result Value Ref Range   WBC 4.9 4.0 - 10.5 K/uL   RBC 3.69 (L) 3.87 - 5.11 MIL/uL   Hemoglobin 11.0 (L) 12.0 - 15.0 g/dL   HCT 32.4 (L) 40.1 - 02.7 %   MCV 94.0 80.0 - 100.0 fL   MCH 29.8 26.0 - 34.0 pg   MCHC 31.7 30.0 - 36.0 g/dL   RDW 25.3 66.4 - 40.3 %   Platelets 325 150 - 400 K/uL   nRBC 0.0 0.0 - 0.2 %    Assessment and Plan  Right hand pain Assessment & Plan: Acute, concern for additional fracture given new fall after surgical repair of right humeral fracture. Pain poorly controlled. Will evaluate with x-ray hand as  well as repeat x-ray humerus to make sure no movement of hardware/new fracture.  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   Orders: -     DG Humerus Right; Future -     DG Hand Complete Right; Future  Right arm pain Assessment & Plan: Acute, concern for additional fracture given new fall after surgical repair of right humeral fracture. Pain poorly controlled. Will evaluate with x-ray hand as well as repeat x-ray humerus to make sure no movement of hardware/new fracture.  Will treat with hydrocodone  for breakthrough pain:Take 1-2 tablets by mouth every 6 (six) hours as needed for moderate pain   Orders: -     DG Humerus Right; Future  Other orders -     HYDROcodone -Acetaminophen ; Take 1-2 tablets by mouth every 6 (six) hours as needed for moderate pain (pain score 4-6).  Dispense: 30 tablet; Refill: 0  No follow-ups on file.   Herby Lolling, MD

## 2023-09-09 NOTE — Telephone Encounter (Signed)
 Noted and agree.

## 2023-09-09 NOTE — Telephone Encounter (Signed)
 Verbal orders given to [facility], via telephone,  for OT 1 x week for 1 week, then 2 x week for 7 weeks.

## 2023-09-11 ENCOUNTER — Other Ambulatory Visit: Payer: Self-pay | Admitting: Family Medicine

## 2023-09-11 ENCOUNTER — Ambulatory Visit: Payer: Self-pay

## 2023-09-11 MED ORDER — PROMETHAZINE HCL 25 MG PO TABS: 12.5000 mg | ORAL_TABLET | Freq: Three times a day (TID) | ORAL | 0 refills | Status: AC | PRN

## 2023-09-11 NOTE — Addendum Note (Signed)
 Addended by: Herby Lolling E on: 09/11/2023 01:16 PM   Modules accepted: Orders

## 2023-09-11 NOTE — Telephone Encounter (Signed)
 RN triaged pt, pt then requested that RN speak to her caregiver Jenette Mitchell at the following number [phone]. RN attempted to call caregiver/transportation to schedule appt for pt. Pt authorizes speaking to [employer]. Jenette Mitchell did not answer, no VM left. Pt needs to be scheduled, pt will not schedule without speaking to [place].  Chief Complaint: nausea Symptoms: nausea, dry-heaves Frequency: ongoing, pt states that she has been evaluated by her PCP for this and was told that she would get an rx. Pt states that she was never given the rx.  Pertinent Negatives: Patient denies CP, SOB, fever, GU s/s, abd pain, vomiting,  Disposition: [] ED /[] Urgent Care (no appt availability in office) / [x] Appointment(In office/virtual)/ []  [facility]/ [] Home Care/ [] Refused Recommended Disposition /[] [place] [facility]/ []  Follow-up with PCP Additional Notes: Pt states that she has been having this nausea ongoing for awhile. Pt states that she was told she would get an rx for antemetic, but was never given the rx. Pt states that she never throws up. Pt will not schedule appt, requests that RN contact her caregiver, Audery Blazing. RN attempted contact, no VM could be left. RN advised pt to let Jenette Mitchell know to call and schedule for pt.   Reason for Disposition . Nausea lasts > 1 week  Answer Assessment - Initial Assessment Questions 1. NAUSEA SEVERITY: "How bad is the nausea?" (e.g., mild, moderate, severe; dehydration, weight loss)   - MILD: loss of appetite without change in eating habits   - MODERATE: decreased oral intake without significant weight loss, dehydration, or malnutrition   - SEVERE: inadequate caloric or fluid intake, significant weight loss, symptoms of dehydration     Moderate to severe 2. ONSET: "When did the nausea begin?"     weeks 3. VOMITING: "Any vomiting?" If Yes, ask: "How many times today?"     Haven't vomited 4. RECURRENT SYMPTOM: "Have you had nausea before?" If Yes, ask:  "When was the last time?" "What happened that time?"     Nothing they just gave me medicine 5. CAUSE: "What do you think is causing the nausea?"      No idea  Protocols used: Nausea-A-AH

## 2023-09-11 NOTE — Telephone Encounter (Signed)
 Jenette Mitchell (caregiver) notified by telephone that Dr. Cherlyn Cornet sent Mo[place] a Rx for Phenergan  to her pharmacy.  Have her limit use as this can make her feel tired and dizzy.  Cannot prescribe Zofran  as she has had side effects to this in the past per allergy profile.

## 2023-09-11 NOTE — Telephone Encounter (Signed)
 Last office visit 09/09/23 hand/arm pain.  Last refilled 04/25/2023 for 2.5 ml with 5 refills.  Next Appt: No future appointments.

## 2023-09-11 NOTE — Telephone Encounter (Signed)
 Let patient know that a prescription for Phenergan  has been sent to her pharmacy.  Have her limit use as this can make her feel tired and dizzy.  Cannot prescribe Zofran  as she has had side effects to this in the past per allergy profile.

## 2023-09-11 NOTE — Telephone Encounter (Signed)
 Patient was just seen 2 days ago.  Don't feel patient needs another appointment.  Will forward message to Dr. Cherlyn Cornet to review and give recommendations.

## 2023-09-12 ENCOUNTER — Ambulatory Visit: Payer: Medicare HMO | Admitting: Internal Medicine

## 2023-09-12 ENCOUNTER — Ambulatory Visit: Payer: Medicare HMO

## 2023-09-12 ENCOUNTER — Other Ambulatory Visit: Payer: Medicare HMO

## 2023-09-15 ENCOUNTER — Encounter: Payer: Self-pay | Admitting: *Deleted

## 2023-09-15 ENCOUNTER — Other Ambulatory Visit: Payer: Self-pay | Admitting: Family Medicine

## 2023-09-15 ENCOUNTER — Encounter: Payer: Self-pay | Admitting: Family Medicine

## 2023-09-15 NOTE — Telephone Encounter (Signed)
 Copied from CRM [id number]. Topic: Clinical - Medical Advice >> Sep 15, 2023  3:22 PM [name] wrote: Reason for CRM: patient called in again regarding her Hydrocodone  prescription and I asked patient if she had looked at her Mychart messages because I saw where there was a message from the doctor sent to her just a few minutes before she called. Patient hadn't been on mychart so I read the message to her and she verbalized understanding of messages, however didn't answer Dr. Artis Lat question and also asked if medication was going to be sent in for her. I stated that it seems like Dr. Cherlyn Cornet is concerned about the medication and wants patient to see her orthopedic. Patient seemed unhappy at not getting medication, however I suggested that she get on mychart to perhaps answer doctor's questions and I also let patient know I would send a message to clinical team to let them know I had spoken with her.

## 2023-09-15 NOTE — Telephone Encounter (Signed)
 Last office visit 09/09/2023 for Arm/Hand Pain.  Last refilled 09/09/2023 for #30 with no refills.  Next appt: No future appointments.

## 2023-09-15 NOTE — Telephone Encounter (Unsigned)
 Copied from CRM [id number]. Topic: Clinical - Medication Refill >> Sep 15, 2023  9:07 AM Allyne Areola wrote: Medication: HYDROcodone -acetaminophen  (NORCO/VICODIN) 5-325 MG tablet   Has the patient contacted their pharmacy? Yes, they asked patient to request this medication through her primary care provider.  (Agent: If no, request that the patient contact the pharmacy for the refill. If patient does not wish to contact the pharmacy document the reason why and proceed with request.) (Agent: If yes, when and what did the pharmacy advise?)  This is the patient's preferred pharmacy:  [facility] [address] ([address]), [place] - [address] [place]) [place] [address] Phone: [phone] Fax: [phone]  Is this the correct pharmacy for this prescription? Yes If no, delete pharmacy and type the correct one.   Has the prescription been filled recently? No  Is the patient out of the medication? Yes  Has the patient been seen for an appointment in the last year OR does the patient have an upcoming appointment? Yes  Can we respond through MyChart? Yes  Agent: Please be advised that Rx refills may take up to 3 business days. We ask that you follow-up with your pharmacy.

## 2023-09-16 DIAGNOSIS — Z8541 Personal history of malignant neoplasm of cervix uteri: Secondary | ICD-10-CM

## 2023-09-16 DIAGNOSIS — I10 Essential (primary) hypertension: Secondary | ICD-10-CM

## 2023-09-16 DIAGNOSIS — K219 Gastro-esophageal reflux disease without esophagitis: Secondary | ICD-10-CM

## 2023-09-16 DIAGNOSIS — S42201D Unspecified fracture of upper end of right humerus, subsequent encounter for fracture with routine healing: Secondary | ICD-10-CM | POA: Diagnosis not present

## 2023-09-16 DIAGNOSIS — F419 Anxiety disorder, unspecified: Secondary | ICD-10-CM | POA: Diagnosis not present

## 2023-09-16 DIAGNOSIS — G9341 Metabolic encephalopathy: Secondary | ICD-10-CM

## 2023-09-16 DIAGNOSIS — R001 Bradycardia, unspecified: Secondary | ICD-10-CM

## 2023-09-16 DIAGNOSIS — E785 Hyperlipidemia, unspecified: Secondary | ICD-10-CM

## 2023-09-16 DIAGNOSIS — Z89511 Acquired absence of right leg below knee: Secondary | ICD-10-CM | POA: Diagnosis not present

## 2023-09-16 DIAGNOSIS — I2510 Atherosclerotic heart disease of native coronary artery without angina pectoris: Secondary | ICD-10-CM

## 2023-09-16 DIAGNOSIS — F319 Bipolar disorder, unspecified: Secondary | ICD-10-CM | POA: Diagnosis not present

## 2023-09-16 DIAGNOSIS — Z96611 Presence of right artificial shoulder joint: Secondary | ICD-10-CM

## 2023-09-23 ENCOUNTER — Encounter (INDEPENDENT_AMBULATORY_CARE_PROVIDER_SITE_OTHER): Payer: Self-pay

## 2023-09-24 NOTE — Assessment & Plan Note (Signed)
 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

## 2023-09-24 NOTE — Assessment & Plan Note (Addendum)
 Acute, concern for additional fracture given new fall after surgical repair of right humeral fracture. Pain poorly controlled. Will evaluate with x-ray hand as well as repeat x-ray humerus to make sure no movement of hardware/new fracture.  Will treat with hydrocodone  for breakthrough pain, limit as able giving sedating side effect will further increase risk of fall.:Take 1-2 tablets by mouth every 6 (six) hours as needed for moderate pain

## 2023-10-03 ENCOUNTER — Telehealth: Payer: Self-pay | Admitting: Family Medicine

## 2023-10-03 NOTE — Telephone Encounter (Signed)
 Does this need new referral to PT OT? Or can verbal orders be given or something on prescription?

## 2023-10-03 NOTE — Telephone Encounter (Signed)
 Copied from CRM [id number]. Topic: General - Other >> Oct 03, 2023 12:03 PM Martinique E wrote: Reason for CRM: Jessee Mormon, patient's agent, called in stating that [facility] needs patient's PT and OT orders for [facility] faxed to [phone]. That fax number is the authorization department for [facility]. Callback number for Jessee Mormon is [phone], patient was on the line as well.

## 2023-10-06 ENCOUNTER — Telehealth: Payer: Self-pay

## 2023-10-06 ENCOUNTER — Telehealth: Payer: Self-pay | Admitting: Family Medicine

## 2023-10-06 NOTE — Telephone Encounter (Signed)
Verbal orders given for skilled nursing

## 2023-10-06 NOTE — Telephone Encounter (Signed)
 Copied from CRM [id number]. Topic: Clinical - Home Health Verbal Orders >> Oct 06, 2023 10:51 AM Artemio Larry wrote: Caller/Agency: Cindy from [facility]  Callback Number: [phone] Service Requested: Skilled Nursing Frequency: n/a Any new concerns about the patient? Yes, patient had evaluation 09/17/23 nursing, therapy 09/23/23 - Carmelina Chinchilla called to confirm patient insurance and patient hasn't been seen since evaluation due to no authorization

## 2023-10-06 NOTE — Telephone Encounter (Signed)
 Left message for Carmelina Chinchilla with [facility] to return my call in regards to skilled nursing orders she is requesting.  Need more specific details on her request like frequency.

## 2023-10-06 NOTE — Telephone Encounter (Signed)
 Copied from CRM [id number]. Topic: General - Call Back - No Documentation >> Oct 06, 2023 11:32 AM Kita Perish H wrote: Reason for CRM: Patient states she missed a call from office, no notes or messages in system.  Ashwini [phone]

## 2023-10-06 NOTE — Telephone Encounter (Signed)
 I dialed patient's number earlier instead to [facility].  Nothing needed currently from patient.

## 2023-10-06 NOTE — Telephone Encounter (Signed)
 It needs to be something written either referral or on Rx pad so I can fax it to [facility].

## 2023-10-07 NOTE — Telephone Encounter (Signed)
 PT/OT orders faxed to [facility] at [phone].

## 2023-10-13 ENCOUNTER — Encounter: Payer: Self-pay | Admitting: Family Medicine

## 2023-10-13 DIAGNOSIS — I10 Essential (primary) hypertension: Secondary | ICD-10-CM

## 2023-10-13 DIAGNOSIS — F319 Bipolar disorder, unspecified: Secondary | ICD-10-CM

## 2023-10-13 MED ORDER — AMLODIPINE BESYLATE 10 MG PO TABS: 10.0000 mg | ORAL_TABLET | Freq: Every day | ORAL | 1 refills | Status: DC

## 2023-10-13 NOTE — Telephone Encounter (Addendum)
 Last office visit 09/09/23 for Arm pain/Fall. Last refilled Lamotrigine  04/24/2023 for #90 with no refills.  Gabapentin  300 mg on  04/25/2023 for #240 with 3 refills but look like dose was changed in hospital 08/15/23 to 100 mg 2 capsules bid.   Next Appt: No future appointments.

## 2023-10-14 MED ORDER — GABAPENTIN 100 MG PO CAPS: 200.0000 mg | ORAL_CAPSULE | Freq: Two times a day (BID) | ORAL | 3 refills | Status: DC

## 2023-10-14 MED ORDER — LAMOTRIGINE 200 MG PO TABS: 200.0000 mg | ORAL_TABLET | Freq: Every day | ORAL | 0 refills | Status: DC

## 2023-10-17 ENCOUNTER — Other Ambulatory Visit: Payer: Self-pay | Admitting: Family Medicine

## 2023-11-05 ENCOUNTER — Other Ambulatory Visit: Payer: Self-pay | Admitting: Family Medicine

## 2023-11-14 ENCOUNTER — Telehealth: Payer: Self-pay

## 2023-11-14 NOTE — Telephone Encounter (Signed)
 Copied from CRM [id number]. Topic: General - Other >> Nov 14, 2023  2:56 PM Martinique E wrote: Reason for CRM: Sharlet, RN with [facility], called in to relay that they are discharging the patient from skilled nursing today, 11/14/2023. Any further questions, Pam's phone number is [phone].

## 2023-11-18 ENCOUNTER — Other Ambulatory Visit: Payer: Self-pay | Admitting: Family Medicine

## 2023-11-18 NOTE — Telephone Encounter (Signed)
 Last office visit 09/09/23 for Arm Pain/Fall.  Last refilled 09/11/23 for 9 ml with no refills.  Next appt: No future appointments.

## 2023-11-26 ENCOUNTER — Other Ambulatory Visit: Payer: Self-pay | Admitting: Family Medicine

## 2023-11-26 DIAGNOSIS — E782 Mixed hyperlipidemia: Secondary | ICD-10-CM

## 2023-11-26 MED ORDER — ATORVASTATIN CALCIUM 20 MG PO TABS: 20.0000 mg | ORAL_TABLET | Freq: Every day | ORAL | 1 refills | Status: AC

## 2023-11-28 ENCOUNTER — Other Ambulatory Visit: Payer: Self-pay | Admitting: Family Medicine

## 2023-12-09 ENCOUNTER — Other Ambulatory Visit: Payer: Self-pay | Admitting: Family Medicine

## 2023-12-11 ENCOUNTER — Encounter: Payer: Self-pay | Admitting: *Deleted

## 2023-12-11 ENCOUNTER — Other Ambulatory Visit: Payer: Self-pay

## 2023-12-11 ENCOUNTER — Ambulatory Visit: Payer: Self-pay

## 2023-12-11 ENCOUNTER — Telehealth: Payer: Self-pay | Admitting: Family Medicine

## 2023-12-11 ENCOUNTER — Emergency Department
Admission: EM | Admit: 2023-12-11 | Discharge: 2023-12-11 | Disposition: A | Discharge status: Home / Self Care | Source: Ambulatory Visit | Attending: Emergency Medicine | Admitting: Emergency Medicine

## 2023-12-11 DIAGNOSIS — I10 Essential (primary) hypertension: Secondary | ICD-10-CM | POA: Insufficient documentation

## 2023-12-11 DIAGNOSIS — Z760 Encounter for issue of repeat prescription: Secondary | ICD-10-CM | POA: Insufficient documentation

## 2023-12-11 NOTE — Telephone Encounter (Signed)
 Called Ms. Moritz but her voicemail was full so I was unable to leave a message.  I will also send patient a MyChart message.

## 2023-12-11 NOTE — Telephone Encounter (Signed)
 Please call patient and verify that she is taking this.  We can only prescribe Cymbalta  if she is also taking Lamictal  given her past diagnosis was bipolar disorder.  It appears in the past she was monitored by psychiatry and we need to get her back to them for chronic care.  Please have her set up a follow-up appointment with psychiatry.

## 2023-12-11 NOTE — Telephone Encounter (Addendum)
 Spoke in detail on phone with her daughter Burnard (DPR in place) who had patient on other line. Pt currently in ED seeing ED MD.  Pt was told to go to ER for refill. Note states mood issues off citalopram    Upon chart review and med review with patient , she has been taking BOTH Cymbalta  60 mg daily and citalopram  daily until 6 days ago. In addition  to lamictal   Appears med was changed at rehab and she should only be on Cymbalta  60 mg daily which she has already refilled.  Pt stated that she has no additional symptoms and understands recommendation. Pt denies SI.  I encouraged daughter and pt to make appt for med review now she has been discharged from rehab on 11/14/2023... both agreeable.  We previously encouraged pt to follow up with psychiatry for bipolar do.. I will again mention at upcoming OV.     CC: Rena and Romaine Maciolek pool as FYI

## 2023-12-11 NOTE — Telephone Encounter (Signed)
 FYI Only or Action Required?: Action required by provider: medication refill request. Citalopram , pt requesting 30 days only as she will be switching pharmacies soon.  Patient was last seen in primary care on 09/09/2023 by [name], MD.  Called Nurse Triage reporting Medication Problem.  Symptoms began 6 days ago.  Interventions attempted: Nothing.  Symptoms are: gradually worsening.  Triage Disposition: Go to ED Now (or PCP Triage)  Patient/caregiver understands and will follow disposition?: Yes       Copied from CRM [id number]. Topic: Clinical - Red Word Triage >> Dec 11, 2023  1:52 PM [name] wrote: Red Word that prompted transfer to Nurse Triage: patient called in for a medicine refill , said she's been without it for 6 days and she feels depressed and fatigue due to not having it. She's asking for Citalopram  Hydrobromide 40 mg Oral Daily Reason for Disposition  Very strange or paranoid behavior    Pt reporting neurological symptoms of concern for withdrawal from SSRI  Answer Assessment - Initial Assessment Questions 1. CONCERN: What happened that made you call today?     Pt ran out of medication x 6 days ago, symptomatic 2. DEPRESSION SYMPTOM SCREENING: How are you feeling overall? (e.g., decreased energy, increased sleeping or difficulty sleeping, difficulty concentrating, feelings of sadness, guilt, hopelessness, or worthlessness)     Increased irritability 3. RISK OF HARM - SUICIDAL IDEATION:  Do you ever have thoughts of hurting or killing yourself?  (e.g., yes, no, no but preoccupation with thoughts about death)     Denies 4. RISK OF HARM - HOMICIDAL IDEATION:  Do you ever have thoughts of hurting or killing someone else?  (e.g., yes, no, no but preoccupation with thoughts about death)     Denies 5. FUNCTIONAL IMPAIRMENT: How have things been going for you overall? Have you had more difficulty than usual doing your normal daily activities?  (e.g., better,  same, worse; self-care, school, work, interactions)  8. STRESSORS: Has there been any new stress or recent changes in your life?     Pt ran out of Citalopram  6 days ago 10. OTHER: Do you have any other physical symptoms right now? (e.g., fever)       Pt notes changes to speech noting periods of expressive aphasia, intermittent vision changes  Protocols used: Depression-A-AH

## 2023-12-11 NOTE — Telephone Encounter (Signed)
 Copied from CRM [id number]. Topic: Clinical - Medication Refill >> Dec 11, 2023  1:46 PM [name] wrote: Medication: citapan 40 mgs   Has the patient contacted their pharmacy? Yes (Agent: If no, request that the patient contact the pharmacy for the refill. If patient does not wish to contact the pharmacy document the reason why and proceed with request.) (Agent: If yes, when and what did the pharmacy advise?)  This is the patient's preferred pharmacy:  [facility] [address] ([address]), [place] - [address] [place]) [place] [address] Phone: [phone] Fax: [phone]  Is this the correct pharmacy for this prescription? Yes If no, delete pharmacy and type the correct one.   Has the prescription been filled recently? No  Is the patient out of the medication? Yes  Has the patient been seen for an appointment in the last year OR does the patient have an upcoming appointment? Yes  Can we respond through MyChart? No  Agent: Please be advised that Rx refills may take up to 3 business days. We ask that you follow-up with your pharmacy.

## 2023-12-11 NOTE — Telephone Encounter (Addendum)
 Rena tried to relay message to Ms. Pearline.  She wanted to know who and when was her medication changed and why didn't anyone tell her it was change.  I will forward this back to Dr. Avelina to have her call and speak with patient about this.    FYI:  Looks like patient is currently in the ER for medication refill

## 2023-12-11 NOTE — ED Provider Notes (Signed)
 [facility] Provider Note    Event Date/Time   First MD Initiated Contact with Patient 12/11/23 1639     (approximate)   History   Medication Refill   HPI  Sydney Evans is a 73 y.o. female with PMH of hypertension, depression, bipolar disorder, anxiety who presents for medication refill.  Patient is requesting a refill of the Celexa  and states that she has been out of the medication for 7 days.  No other complaints at this time.     Physical Exam   Triage Vital Signs: ED Triage Vitals  Encounter Vitals Group     BP 12/11/23 1622 (!) 145/66     Girls Systolic BP Percentile --      Girls Diastolic BP Percentile --      Boys Systolic BP Percentile --      Boys Diastolic BP Percentile --      Pulse Rate 12/11/23 1622 68     Resp 12/11/23 1622 18     Temp 12/11/23 1622 99.1 F (37.3 C)     Temp Source 12/11/23 1622 Oral     SpO2 12/11/23 1622 98 %     Weight 12/11/23 1623 130 lb (59 kg)     Height 12/11/23 1623 5' 2 (1.575 m)     Head Circumference --      Peak Flow --      Pain Score 12/11/23 1622 0     Pain Loc --      Pain Education --      Exclude from Growth Chart --     Most recent vital signs: Vitals:   12/11/23 1622  BP: (!) 145/66  Pulse: 68  Resp: 18  Temp: 99.1 F (37.3 C)  SpO2: 98%    General: Awake, no distress.  CV:  Good peripheral perfusion.  Resp:  Normal effort.  Abd:  No distention.  Other:     ED Results / Procedures / Treatments   Labs (all labs ordered are listed, but only abnormal results are displayed) Labs Reviewed - No data to display  PROCEDURES:  Critical Care performed: No  Procedures   MEDICATIONS ORDERED IN ED: Medications - No data to display   IMPRESSION / MDM / ASSESSMENT AND PLAN / ED COURSE  I reviewed the triage vital signs and the nursing notes.                             73 year old female presents for a medication refill. BP is elevated but patient has a history of  HTN. VSS otherwise. Patient NAD on exam.  Differential diagnosis includes, but is not limited to, medication refill.  Patient's presentation is most consistent with acute, uncomplicated illness.  Review of patient's chart shows that the Celexa  was discontinued on 07/29/2023 by her primary care provider and she was switched to Cymbalta .  Appears that she was given enough medication for 4 months.  She reached out to her primary care provider today for refill. Patient was not aware that her medications had been changed.   While in the emergency department, she spoke with her primary care provider over the phone and realized that she had the medication she needed at home.  She no longer needs a medication refill.  Patient discharged in stable condition.      FINAL CLINICAL IMPRESSION(S) / ED DIAGNOSES   Final diagnoses:  Medication refill  Rx / DC Orders   ED Discharge Orders     None        Note:  This document was prepared using Dragon voice recognition software and may include unintentional dictation errors.   Cleaster Tinnie LABOR, PA-C 12/11/23 1730    Claudene Rover, MD 12/11/23 [phone]

## 2023-12-11 NOTE — Telephone Encounter (Signed)
 Citalopram  40 mg not on current medication list.

## 2023-12-11 NOTE — Telephone Encounter (Signed)
Citalopram is not on current medication list.  Please advise.

## 2023-12-11 NOTE — ED Triage Notes (Signed)
 Pt to triage via wheelchair.  Pt requesting refill of celexa .  Out of medicine for 7 days.  Pt alert  speech clear.

## 2023-12-11 NOTE — Discharge Instructions (Signed)
 Follow up with your primary care provider as needed.

## 2023-12-15 ENCOUNTER — Ambulatory Visit: Payer: Self-pay

## 2023-12-15 NOTE — Telephone Encounter (Signed)
 FYI Only or Action Required?: Action required by provider: request for appointment.  Patient was last seen in primary care on 09/09/2023 by Sydney Greig BRAVO, MD.  Called Nurse Triage reporting Hand Pain.  Symptoms began several weeks ago.  Interventions attempted: Nothing.  Symptoms are: unchanged.  Triage Disposition: See PCP When Office is Open (Within 3 Days)  Patient/caregiver understands and will follow disposition?: YesCopied from CRM [id number]. Topic: Clinical - Red Word Triage >> Dec 15, 2023 10:04 AM Armenia J wrote: [facility] that prompted transfer to Nurse Triage: Patient has a few moles on her back, one of which is black, and she is needing that to be looked at. 3/4th of her thumb nail is a yellow color with pain and swelling and she wants to make sure its not fungus. ----------------------------------------------------------------------- From previous Reason for Contact - Scheduling: Patient/patient representative is calling to schedule an appointment. Refer to attachments for appointment information. Reason for Disposition  [1] MODERATE pain (e.g., interferes with normal activities) AND [2] present > 3 days  Answer Assessment - Initial Assessment Questions Thumb nail is discolored for at least 3 weeks. Nail is yellow. Pain is around cuticle. Pt also wants to discuss some moles that she can't see. Pt requested to be seen Thursday due to transportation scheduling.      1. ONSET: When did the pain start?      Months ago 2. LOCATION and RADIATION: Where is the pain located?  (e.g., fingertip, around nail, joint, entire      Right thumb 3. SEVERITY: How bad is the pain? What does it keep you from doing?   (Scale 1-10; or mild, moderate, severe)     5 4. APPEARANCE: What does the finger look like? (e.g., redness, swelling, bruising, pallor)     Nailbed is yellow 5. WORK OR EXERCISE: Has there been any recent work or exercise that involved this part (i.e., fingers or  hand) of the body?     na 6. CAUSE: What do you think is causing the pain?     Not sure 7. AGGRAVATING FACTORS: What makes the pain worse? (e.g., using computer)     Na  8. OTHER SYMPTOMS: Do you have any other symptoms? (e.g., fever, neck pain, numbness)     denies  Protocols used: Finger Pain-A-AH

## 2023-12-16 NOTE — Telephone Encounter (Signed)
 Appointment scheduled with Dr. Avelina 12/18/23.

## 2023-12-18 ENCOUNTER — Ambulatory Visit (INDEPENDENT_AMBULATORY_CARE_PROVIDER_SITE_OTHER): Admitting: Family Medicine

## 2023-12-18 VITALS — BP 90/60 | HR 54 | Temp 99.0°F

## 2023-12-18 DIAGNOSIS — R296 Repeated falls: Secondary | ICD-10-CM

## 2023-12-18 DIAGNOSIS — I10 Essential (primary) hypertension: Secondary | ICD-10-CM | POA: Diagnosis not present

## 2023-12-18 DIAGNOSIS — G8929 Other chronic pain: Secondary | ICD-10-CM | POA: Diagnosis not present

## 2023-12-18 DIAGNOSIS — M79641 Pain in right hand: Secondary | ICD-10-CM

## 2023-12-18 DIAGNOSIS — F319 Bipolar disorder, unspecified: Secondary | ICD-10-CM | POA: Diagnosis not present

## 2023-12-18 MED ORDER — CITALOPRAM HYDROBROMIDE 40 MG PO TABS: 40.0000 mg | ORAL_TABLET | Freq: Every day | ORAL | 1 refills | Status: DC

## 2023-12-18 MED ORDER — CYCLOBENZAPRINE HCL 5 MG PO TABS: 2.5000 mg | ORAL_TABLET | Freq: Every day | ORAL | 0 refills | Status: AC

## 2023-12-18 NOTE — Progress Notes (Signed)
 Patient ID: Sydney Evans, female    DOB: Mar 31, 1951, 73 y.o.   MRN: [id number]  This visit was conducted in person.  BP 90/60   Pulse (!) 54   Temp 99 F (37.2 C) (Temporal)   SpO2 96%    CC:  Chief Complaint  Patient presents with   Hand Pain    Right Thumb     Nevus    On back   Medication Mangement    Wants to go back on Ciltalopram Wants a muscle relaxant    Subjective:   HPI: Sydney Evans is a 73 y.o. female presenting on 12/18/2023 for Hand Pain (Right Thumb//), Nevus (On back), and Medication Mangement (Wants to go back on Ciltalopram/Wants a muscle relaxant)  Right thumb pain Plain film of right hand from Sep 09, 2023 showed mild osteoarthritis with joint space narrowing and spurring.   Bipolar depression: At last hospitalization/rehab her citalopram  was changed to Cymbalta .  She had been taking both of them for a while until recent ED visit.  She continues on Lamictal  200 mg daily as mood stabilizer.  She has been feeling more better with her mood overall... trying to be more healthy  She does not have support animal at home and is lonely.  She does not want  cymbalta  because she does not feel she has significant phantom pain. She feels that cymblata is making it hard to fall asleep.  She feels it has not helped with her mood any more that citalopram  did.  She has been drinking protein shakes as supplements to meals. She is trying to stay active with dancing to music in chair.  She is motivated.  She has not been falling in last 4 months... this has resolved. Balance is better with home exercises and better nutrition.  She is requesting a muscle relaxant to use for low back , flank and hips... she would use this on a limited basis, but has cramps and spasms from sitting in wheelchair  She has a mole on her back that she is concerned about... home health nurse has noted  3 areas   Hypertension:  Low normal on amlodipine  10 mg daily BP Readings from  Last 3 Encounters:  12/18/23 90/60  12/11/23 (!) 145/66  09/09/23 (!) 150/72  Using medication without problems or lightheadedness:  occ Chest pain with exertion: none Edema:none Short of breath: none Average home BPs: Other issues:  She has lost 20 lbs in last 4 months. Wt Readings from Last 3 Encounters:  12/11/23 130 lb (59 kg)  08/14/23 152 lb 12.5 oz (69.3 kg)  08/06/23 150 lb (68 kg)     Relevant past medical, surgical, family and social history reviewed and updated as indicated. Interim medical history since our last visit reviewed. Allergies and medications reviewed and updated. Outpatient Medications Prior to Visit  Medication Sig Dispense Refill   albuterol  (VENTOLIN  HFA) 108 (90 Base) MCG/ACT inhaler INHALE 2 PUFFS BY MOUTH EVERY 4 HOURS AS NEEDED FOR SHORTNESS OF BREATH 9 g 0   amLODipine  (NORVASC ) 10 MG tablet Take 1 tablet (10 mg total) by mouth daily. 90 tablet 1   atorvastatin  (LIPITOR) 20 MG tablet Take 1 tablet (20 mg total) by mouth daily. 90 tablet 1   carboxymethylcellulose (LUBRICANT EYE DROPS) 0.5 % SOLN Place 2 drops into both eyes daily as needed (Dry eye). Systane 30 mL 3   fluticasone  (FLONASE ) 50 MCG/ACT nasal spray Place 1 spray into both nostrils daily as needed  for allergies or rhinitis. 16 g 5   gabapentin  (NEURONTIN ) 100 MG capsule Take 2 capsules (200 mg total) by mouth 2 (two) times daily. 120 capsule 3   HYDROcodone -acetaminophen  (NORCO/VICODIN) 5-325 MG tablet Take 1-2 tablets by mouth every 6 (six) hours as needed for moderate pain (pain score 4-6). 30 tablet 0   lamoTRIgine  (LAMICTAL ) 200 MG tablet Take 1 tablet (200 mg total) by mouth daily. 90 tablet 0   latanoprost  (XALATAN ) 0.005 % ophthalmic solution INSTILL 1 DROP INTO EACH EYE AT BEDTIME 9 mL 0   pantoprazole  (PROTONIX ) 40 MG tablet Take 1 tablet by mouth twice daily 180 tablet 1   promethazine  (PHENERGAN ) 25 MG tablet Take 0.5-1 tablets (12.5-25 mg total) by mouth every 8 (eight) hours  as needed for nausea or vomiting. 30 tablet 0   DULoxetine  (CYMBALTA ) 60 MG capsule Take 1 capsule (60 mg total) by mouth daily. 30 capsule 3   No facility-administered medications prior to visit.     Per HPI unless specifically indicated in ROS section below Review of Systems  Constitutional:  Negative for fatigue and fever.  HENT:  Negative for congestion.   Eyes:  Negative for pain.  Respiratory:  Negative for cough and shortness of breath.   Cardiovascular:  Negative for chest pain, palpitations and leg swelling.  Gastrointestinal:  Negative for abdominal pain.  Genitourinary:  Negative for dysuria and vaginal bleeding.  Musculoskeletal:  Negative for back pain.  Neurological:  Negative for syncope, light-headedness and headaches.  Psychiatric/Behavioral:  Negative for dysphoric mood.    Objective:  BP 90/60   Pulse (!) 54   Temp 99 F (37.2 C) (Temporal)   SpO2 96%   Wt Readings from Last 3 Encounters:  12/11/23 130 lb (59 kg)  08/14/23 152 lb 12.5 oz (69.3 kg)  08/06/23 150 lb (68 kg)      Physical Exam Constitutional:      General: She is not in acute distress.    Appearance: Normal appearance. She is well-developed. She is not ill-appearing or toxic-appearing.  HENT:     Head: Normocephalic.     Right Ear: Hearing, tympanic membrane, ear canal and external ear normal. Tympanic membrane is not erythematous, retracted or bulging.     Left Ear: Hearing, tympanic membrane, ear canal and external ear normal. Tympanic membrane is not erythematous, retracted or bulging.     Nose: No mucosal edema or rhinorrhea.     Right Sinus: No maxillary sinus tenderness or frontal sinus tenderness.     Left Sinus: No maxillary sinus tenderness or frontal sinus tenderness.     Mouth/Throat:     Pharynx: Uvula midline.  Eyes:     General: Lids are normal. Lids are everted, no foreign bodies appreciated.     Conjunctiva/sclera: Conjunctivae normal.     Pupils: Pupils are equal, round,  and reactive to light.  Neck:     Thyroid : No thyroid  mass or thyromegaly.     Vascular: No carotid bruit.     Trachea: Trachea normal.  Cardiovascular:     Rate and Rhythm: Normal rate and regular rhythm.     Pulses: Normal pulses.     Heart sounds: Normal heart sounds, S1 normal and S2 normal. No murmur heard.    No friction rub. No gallop.  Pulmonary:     Effort: Pulmonary effort is normal. No tachypnea or respiratory distress.     Breath sounds: Normal breath sounds. No decreased breath sounds, wheezing, rhonchi or rales.  Abdominal:     General: Bowel sounds are normal.     Palpations: Abdomen is soft.     Tenderness: There is no abdominal tenderness.  Musculoskeletal:     Right elbow: Normal.     Right forearm: Tenderness and bony tenderness present.     Right wrist: Normal.     Cervical back: Normal range of motion and neck supple.     Comments: Right knee stump tender with slight swelling on distal stump where patient hit directly, no contusion.     Right Lower Extremity: Right leg is amputated below knee.  Skin:    General: Skin is warm and dry.     Findings: No rash.     Comments: 3 skin lesions on central back, 1 nonirritated skin tag, 1 large blackhead with likely subcutaneous cyst underlying without redness or swelling or pain.  1 large seborrheic keratosis not irritated  Neurological:     Mental Status: She is alert.  Psychiatric:        Mood and Affect: Mood is not anxious or depressed.        Speech: Speech normal.        Behavior: Behavior normal. Behavior is cooperative.        Thought Content: Thought content normal.        Judgment: Judgment normal.       Results for orders placed or performed during the hospital encounter of 08/08/23  Rapid urine drug screen (hospital performed)   Collection Time: 08/08/23  3:15 PM  Result Value Ref Range   Opiates NONE DETECTED NONE DETECTED   Cocaine NONE DETECTED NONE DETECTED   Benzodiazepines NONE DETECTED NONE  DETECTED   Amphetamines NONE DETECTED NONE DETECTED   Tetrahydrocannabinol NONE DETECTED NONE DETECTED   Barbiturates NONE DETECTED NONE DETECTED  Urinalysis, Routine w reflex microscopic -Urine, Clean Catch   Collection Time: 08/08/23  3:21 PM  Result Value Ref Range   Color, Urine YELLOW YELLOW   APPearance CLEAR CLEAR   Specific Gravity, Urine 1.003 (L) 1.005 - 1.030   pH 7.0 5.0 - 8.0   Glucose, UA NEGATIVE NEGATIVE mg/dL   Hgb urine dipstick SMALL (A) NEGATIVE   Bilirubin Urine NEGATIVE NEGATIVE   Ketones, ur NEGATIVE NEGATIVE mg/dL   Protein, ur NEGATIVE NEGATIVE mg/dL   Nitrite NEGATIVE NEGATIVE   Leukocytes,Ua NEGATIVE NEGATIVE   RBC / HPF 0-5 0 - 5 RBC/hpf   WBC, UA 0-5 0 - 5 WBC/hpf   Bacteria, UA NONE SEEN NONE SEEN   Squamous Epithelial / HPF 0-5 0 - 5 /HPF  Comprehensive metabolic panel with GFR   Collection Time: 08/08/23  3:24 PM  Result Value Ref Range   Sodium 135 135 - 145 mmol/L   Potassium 3.6 3.5 - 5.1 mmol/L   Chloride 102 98 - 111 mmol/L   CO2 23 22 - 32 mmol/L   Glucose, Bld 89 70 - 99 mg/dL   BUN 5 (L) 8 - 23 mg/dL   Creatinine, Ser 9.15 0.44 - 1.00 mg/dL   Calcium  9.3 8.9 - 10.3 mg/dL   Total Protein 6.5 6.5 - 8.1 g/dL   Albumin 3.1 (L) 3.5 - 5.0 g/dL   AST 22 15 - 41 U/L   ALT 14 0 - 44 U/L   Alkaline Phosphatase 64 38 - 126 U/L   Total Bilirubin 1.0 0.0 - 1.2 mg/dL   GFR, Estimated >39 >39 mL/min   Anion gap 10 5 - 15  CK  Collection Time: 08/08/23  3:24 PM  Result Value Ref Range   Total CK 262 (H) 38 - 234 U/L  CBC   Collection Time: 08/08/23  3:24 PM  Result Value Ref Range   WBC 5.1 4.0 - 10.5 K/uL   RBC 4.06 3.87 - 5.11 MIL/uL   Hemoglobin 12.0 12.0 - 15.0 g/dL   HCT 64.4 (L) 63.9 - 53.9 %   MCV 87.4 80.0 - 100.0 fL   MCH 29.6 26.0 - 34.0 pg   MCHC 33.8 30.0 - 36.0 g/dL   RDW 86.2 88.4 - 84.4 %   Platelets 262 150 - 400 K/uL   nRBC 0.0 0.0 - 0.2 %  Phosphorus   Collection Time: 08/08/23  3:24 PM  Result Value Ref Range    Phosphorus 2.9 2.5 - 4.6 mg/dL  Magnesium    Collection Time: 08/08/23  3:24 PM  Result Value Ref Range   Magnesium  1.9 1.7 - 2.4 mg/dL  VITAMIN D  25 Hydroxy (Vit-D Deficiency, Fractures)   Collection Time: 08/08/23  3:24 PM  Result Value Ref Range   Vit D, 25-Hydroxy 44.01 30 - 100 ng/mL  HIV Antibody (routine testing w rflx)   Collection Time: 08/08/23  4:08 PM  Result Value Ref Range   HIV Screen 4th Generation wRfx Non Reactive Non Reactive  TSH   Collection Time: 08/08/23  4:08 PM  Result Value Ref Range   TSH 1.781 0.350 - 4.500 uIU/mL  RPR   Collection Time: 08/08/23  4:08 PM  Result Value Ref Range   RPR Ser Ql NON REACTIVE NON REACTIVE  Vitamin B12   Collection Time: 08/08/23  4:08 PM  Result Value Ref Range   Vitamin B-12 1,342 (H) 180 - 914 pg/mL  Ammonia   Collection Time: 08/08/23  4:08 PM  Result Value Ref Range   Ammonia 41 (H) 9 - 35 umol/L  Blood gas, venous   Collection Time: 08/08/23  4:56 PM  Result Value Ref Range   pH, Ven 7.35 7.25 - 7.43   pCO2, Ven 43 (L) 44 - 60 mmHg   pO2, Ven 34 32 - 45 mmHg   Bicarbonate 24.3 20.0 - 28.0 mmol/L   Acid-base deficit 1.5 0.0 - 2.0 mmol/L   O2 Saturation 60.2 %   Patient temperature 36.7    Collection site LEFT ARM    Drawn by MYRTIE GAY   Renal function panel   Collection Time: 08/09/23  6:40 AM  Result Value Ref Range   Sodium 137 135 - 145 mmol/L   Potassium 3.2 (L) 3.5 - 5.1 mmol/L   Chloride 105 98 - 111 mmol/L   CO2 20 (L) 22 - 32 mmol/L   Glucose, Bld 80 70 - 99 mg/dL   BUN <5 (L) 8 - 23 mg/dL   Creatinine, Ser 9.21 0.44 - 1.00 mg/dL   Calcium  8.6 (L) 8.9 - 10.3 mg/dL   Phosphorus 2.9 2.5 - 4.6 mg/dL   Albumin 2.6 (L) 3.5 - 5.0 g/dL   GFR, Estimated >39 >39 mL/min   Anion gap 12 5 - 15  Magnesium    Collection Time: 08/09/23  6:40 AM  Result Value Ref Range   Magnesium  1.8 1.7 - 2.4 mg/dL  CBC   Collection Time: 08/09/23  6:40 AM  Result Value Ref Range   WBC 4.5 4.0 - 10.5 K/uL   RBC  3.89 3.87 - 5.11 MIL/uL   Hemoglobin 11.3 (L) 12.0 - 15.0 g/dL   HCT 65.5 (L) 63.9 -  46.0 %   MCV 88.4 80.0 - 100.0 fL   MCH 29.0 26.0 - 34.0 pg   MCHC 32.8 30.0 - 36.0 g/dL   RDW 85.7 88.4 - 84.4 %   Platelets 250 150 - 400 K/uL   nRBC 0.0 0.0 - 0.2 %  Renal function panel   Collection Time: 08/10/23  7:13 AM  Result Value Ref Range   Sodium 138 135 - 145 mmol/L   Potassium 3.5 3.5 - 5.1 mmol/L   Chloride 106 98 - 111 mmol/L   CO2 20 (L) 22 - 32 mmol/L   Glucose, Bld 83 70 - 99 mg/dL   BUN <5 (L) 8 - 23 mg/dL   Creatinine, Ser 9.40 0.44 - 1.00 mg/dL   Calcium  8.5 (L) 8.9 - 10.3 mg/dL   Phosphorus 2.7 2.5 - 4.6 mg/dL   Albumin 2.7 (L) 3.5 - 5.0 g/dL   GFR, Estimated >39 >39 mL/min   Anion gap 12 5 - 15  Magnesium    Collection Time: 08/10/23  7:13 AM  Result Value Ref Range   Magnesium  1.7 1.7 - 2.4 mg/dL  Surgical pcr screen   Collection Time: 08/10/23  8:23 PM   Specimen: Nasal Mucosa; Nasal Swab  Result Value Ref Range   MRSA, PCR NEGATIVE NEGATIVE   Staphylococcus aureus NEGATIVE NEGATIVE  CBC   Collection Time: 08/12/23  4:45 AM  Result Value Ref Range   WBC 5.8 4.0 - 10.5 K/uL   RBC 4.37 3.87 - 5.11 MIL/uL   Hemoglobin 12.7 12.0 - 15.0 g/dL   HCT 61.5 63.9 - 53.9 %   MCV 87.9 80.0 - 100.0 fL   MCH 29.1 26.0 - 34.0 pg   MCHC 33.1 30.0 - 36.0 g/dL   RDW 85.8 88.4 - 84.4 %   Platelets 324 150 - 400 K/uL   nRBC 0.0 0.0 - 0.2 %  Basic metabolic panel with GFR   Collection Time: 08/12/23  4:45 AM  Result Value Ref Range   Sodium 134 (L) 135 - 145 mmol/L   Potassium 3.3 (L) 3.5 - 5.1 mmol/L   Chloride 103 98 - 111 mmol/L   CO2 21 (L) 22 - 32 mmol/L   Glucose, Bld 99 70 - 99 mg/dL   BUN 10 8 - 23 mg/dL   Creatinine, Ser 9.32 0.44 - 1.00 mg/dL   Calcium  8.5 (L) 8.9 - 10.3 mg/dL   GFR, Estimated >39 >39 mL/min   Anion gap 10 5 - 15  No blood products   Collection Time: 08/12/23  9:30 AM  Result Value Ref Range   Transfuse no blood products      TRANSFUSE NO  BLOOD PRODUCTS, VERIFIED BY CARESON HOWELL RN AT 0930 4.8.25 Performed at [facility], [address] [place], [place] [address]   CBC   Collection Time: 08/13/23  4:56 AM  Result Value Ref Range   WBC 6.2 4.0 - 10.5 K/uL   RBC 3.81 (L) 3.87 - 5.11 MIL/uL   Hemoglobin 11.3 (L) 12.0 - 15.0 g/dL   HCT 66.0 (L) 63.9 - 53.9 %   MCV 89.0 80.0 - 100.0 fL   MCH 29.7 26.0 - 34.0 pg   MCHC 33.3 30.0 - 36.0 g/dL   RDW 85.6 88.4 - 84.4 %   Platelets 303 150 - 400 K/uL   nRBC 0.0 0.0 - 0.2 %  Basic metabolic panel with GFR   Collection Time: 08/13/23  4:56 AM  Result Value Ref Range  Sodium 135 135 - 145 mmol/L   Potassium 4.4 3.5 - 5.1 mmol/L   Chloride 106 98 - 111 mmol/L   CO2 19 (L) 22 - 32 mmol/L   Glucose, Bld 128 (H) 70 - 99 mg/dL   BUN 11 8 - 23 mg/dL   Creatinine, Ser 9.29 0.44 - 1.00 mg/dL   Calcium  8.1 (L) 8.9 - 10.3 mg/dL   GFR, Estimated >39 >39 mL/min   Anion gap 10 5 - 15  Phosphorus   Collection Time: 08/13/23  4:56 AM  Result Value Ref Range   Phosphorus 4.7 (H) 2.5 - 4.6 mg/dL  Basic metabolic panel with GFR   Collection Time: 08/14/23  4:49 AM  Result Value Ref Range   Sodium 137 135 - 145 mmol/L   Potassium 3.6 3.5 - 5.1 mmol/L   Chloride 107 98 - 111 mmol/L   CO2 21 (L) 22 - 32 mmol/L   Glucose, Bld 85 70 - 99 mg/dL   BUN 10 8 - 23 mg/dL   Creatinine, Ser 9.30 0.44 - 1.00 mg/dL   Calcium  8.4 (L) 8.9 - 10.3 mg/dL   GFR, Estimated >39 >39 mL/min   Anion gap 9 5 - 15  CBC   Collection Time: 08/14/23  4:49 AM  Result Value Ref Range   WBC 5.8 4.0 - 10.5 K/uL   RBC 3.47 (L) 3.87 - 5.11 MIL/uL   Hemoglobin 10.4 (L) 12.0 - 15.0 g/dL   HCT 68.5 (L) 63.9 - 53.9 %   MCV 90.5 80.0 - 100.0 fL   MCH 30.0 26.0 - 34.0 pg   MCHC 33.1 30.0 - 36.0 g/dL   RDW 85.3 88.4 - 84.4 %   Platelets 296 150 - 400 K/uL   nRBC 0.0 0.0 - 0.2 %  Basic metabolic panel with GFR   Collection Time: 08/15/23  6:07 AM  Result Value Ref Range   Sodium 137 135 - 145 mmol/L    Potassium 3.6 3.5 - 5.1 mmol/L   Chloride 110 98 - 111 mmol/L   CO2 20 (L) 22 - 32 mmol/L   Glucose, Bld 81 70 - 99 mg/dL   BUN 11 8 - 23 mg/dL   Creatinine, Ser 9.23 0.44 - 1.00 mg/dL   Calcium  8.6 (L) 8.9 - 10.3 mg/dL   GFR, Estimated >39 >39 mL/min   Anion gap 7 5 - 15  CBC   Collection Time: 08/15/23  6:07 AM  Result Value Ref Range   WBC 4.9 4.0 - 10.5 K/uL   RBC 3.69 (L) 3.87 - 5.11 MIL/uL   Hemoglobin 11.0 (L) 12.0 - 15.0 g/dL   HCT 65.2 (L) 63.9 - 53.9 %   MCV 94.0 80.0 - 100.0 fL   MCH 29.8 26.0 - 34.0 pg   MCHC 31.7 30.0 - 36.0 g/dL   RDW 85.3 88.4 - 84.4 %   Platelets 325 150 - 400 K/uL   nRBC 0.0 0.0 - 0.2 %    Assessment and Plan  Essential hypertension Assessment & Plan: Chronic, low normal..  Likely reduced given substantial weight loss She will follow the blood pressure at home and call in 2 weeks with measurements.  If numbers are low normal and any lightheadedness we will decrease amlodipine  to 5 mg daily  On amlodipine  10 mg daily   Bipolar depression (HCC) Assessment & Plan: Good control control, chronic.      Change cymbalta  toCelexa 40 mg daily given side effect of interfering with sleep.  We discussed  that I am hesitant to make this change given I do think she has had significant improvement from my standpoint with her mood on Cymbalta .  She will let me know if her mood worsens back on citalopram . Congratulated her on all the lifestyle changes she has done that have helped both her mood and her physical status.  Lamictal  200 mg daily.  Not currently followed by psychiatry.   Right hand pain  Frequent falls Assessment & Plan: Past, this has stopped with improved nutrition, hydration and home physical therapy. She appears to be doing better overall.   Other chronic pain Assessment & Plan: Per patient chronic pain is minimal.  She would like to switch back to citalopram .  I am hesitant given I feel that Cymbalta  could be helping with her  overall mood and chronic pain history.  She will keep an eye on this back on citalopram .   Other orders -     Citalopram  Hydrobromide; Take 1 tablet (40 mg total) by mouth daily.  Dispense: 90 tablet; Refill: 1 -     Cyclobenzaprine  HCl; Take 0.5 tablets (2.5 mg total) by mouth at bedtime.  Dispense: 30 tablet; Refill: 0    Return in about 3 months (around 03/19/2024) for phone AMW,  fasting labs then CPE with me.   Greig Ring, MD

## 2023-12-18 NOTE — Assessment & Plan Note (Addendum)
 Chronic, low normal..  Likely reduced given substantial weight loss She will follow the blood pressure at home and call in 2 weeks with measurements.  If numbers are low normal and any lightheadedness we will decrease amlodipine  to 5 mg daily  On amlodipine  10 mg daily

## 2023-12-18 NOTE — Patient Instructions (Addendum)
 Check Blood pressure daily at home over the next 2 week... call with measurements... if it is running low normal with the weight loss... we can decrease the amlodipine .  Stop Cymbalta  and restarting ciltalopram.  Limit the muscle relaxant to only as needed for muscle spasms.

## 2023-12-18 NOTE — Assessment & Plan Note (Signed)
 Per patient chronic pain is minimal.  She would like to switch back to citalopram .  I am hesitant given I feel that Cymbalta  could be helping with her overall mood and chronic pain history.  She will keep an eye on this back on citalopram .

## 2023-12-18 NOTE — Assessment & Plan Note (Addendum)
 Good control control, chronic.      Change cymbalta  toCelexa 40 mg daily given side effect of interfering with sleep.  We discussed that I am hesitant to make this change given I do think she has had significant improvement from my standpoint with her mood on Cymbalta .  She will let me know if her mood worsens back on citalopram . Congratulated her on all the lifestyle changes she has done that have helped both her mood and her physical status.  Lamictal  200 mg daily.  Not currently followed by psychiatry.

## 2023-12-18 NOTE — Assessment & Plan Note (Signed)
 Past, this has stopped with improved nutrition, hydration and home physical therapy. She appears to be doing better overall.

## 2023-12-26 DIAGNOSIS — Z89511 Acquired absence of right leg below knee: Secondary | ICD-10-CM | POA: Diagnosis not present

## 2024-01-07 ENCOUNTER — Other Ambulatory Visit: Payer: Self-pay | Admitting: Family Medicine

## 2024-01-07 DIAGNOSIS — F319 Bipolar disorder, unspecified: Secondary | ICD-10-CM

## 2024-01-07 DIAGNOSIS — S42291D Other displaced fracture of upper end of right humerus, subsequent encounter for fracture with routine healing: Secondary | ICD-10-CM | POA: Diagnosis not present

## 2024-01-07 DIAGNOSIS — I10 Essential (primary) hypertension: Secondary | ICD-10-CM

## 2024-01-07 MED ORDER — AMLODIPINE BESYLATE 10 MG PO TABS: 10.0000 mg | ORAL_TABLET | Freq: Every day | ORAL | 1 refills | Status: AC

## 2024-01-07 MED ORDER — LAMOTRIGINE 200 MG PO TABS: 200.0000 mg | ORAL_TABLET | Freq: Every day | ORAL | 0 refills | Status: DC

## 2024-01-07 NOTE — Telephone Encounter (Signed)
 Copied from CRM #[id number]. Topic: Clinical - Medication Refill >> Jan 07, 2024  9:21 AM Burnard DEL wrote: Medication: lamoTRIgine  (LAMICTAL ) 200 MG tablet  amLODipine  (NORVASC ) 10 MG tablet  Has the patient contacted their pharmacy? No (Agent: If no, request that the patient contact the pharmacy for the refill. If patient does not wish to contact the pharmacy document the reason why and proceed with request.) (Agent: If yes, when and what did the pharmacy advise?)  This is the patient's preferred pharmacy:  [facility] - [place], [place] - [address] [place] [address] Phone: [phone] Fax: [phone]  Is this the correct pharmacy for this prescription? Yes If no, delete pharmacy and type the correct one.   Has the prescription been filled recently? No  Is the patient out of the medication? Yes  Has the patient been seen for an appointment in the last year OR does the patient have an upcoming appointment? Yes  Can we respond through MyChart? no  Agent: Please be advised that Rx refills may take up to 3 business days. We ask that you follow-up with your pharmacy.

## 2024-01-09 ENCOUNTER — Telehealth: Payer: Self-pay | Admitting: *Deleted

## 2024-01-09 NOTE — Telephone Encounter (Signed)
 Copied from CRM [id number]. Topic: General - Other >> Jan 09, 2024  8:44 AM Revonda D wrote: Reason for CRM: Leanne with [facility] stated that they would like to re certify the pt for PT but will have to discharged the pt due to the insurance denying the re certification request.

## 2024-01-09 NOTE — Telephone Encounter (Signed)
 I think this is just an FYI.  They wanted to re-certify patient of PT for insurance denied it.

## 2024-01-30 ENCOUNTER — Other Ambulatory Visit: Payer: Self-pay | Admitting: Family Medicine

## 2024-01-30 NOTE — Telephone Encounter (Signed)
 Copied from CRM [id number]. Topic: Clinical - Medication Refill >> Jan 30, 2024 11:36 AM Suzen RAMAN wrote: Medication: gabapentin  (NEURONTIN ) 300 MG capsule   Has the patient contacted their pharmacy? Yes  This is the patient's preferred pharmacy:  [facility] - [place], [place] - [address] [place] [address] Phone: [phone] Fax: [phone]  Is this the correct pharmacy for this prescription? Yes If no, delete pharmacy and type the correct one.   Has the prescription been filled recently? No  Is the patient out of the medication? No  Has the patient been seen for an appointment in the last year OR does the patient have an upcoming appointment? Yes  Can we respond through MyChart? Yes  Agent: Please be advised that Rx refills may take up to 3 business days. We ask that you follow-up with your pharmacy.

## 2024-01-30 NOTE — Telephone Encounter (Signed)
 Last office visit 12/18/2023 for HTN, Bipolar, Hand Pain, Falls, Chronic Pain..  Last refilled 10/14/2023 for #120 with 3 refills.  Next appt: No future appointments.

## 2024-02-02 ENCOUNTER — Other Ambulatory Visit: Payer: Self-pay | Admitting: Family Medicine

## 2024-02-02 MED ORDER — FLUTICASONE PROPIONATE 50 MCG/ACT NA SUSP: 1.0000 | Freq: Every day | NASAL | 5 refills | Status: AC | PRN

## 2024-02-02 NOTE — Telephone Encounter (Unsigned)
 Copied from CRM [id number]. Topic: Clinical - Medication Refill >> Feb 02, 2024  8:15 AM Tiffini S wrote: Medication: ibuprofen  800mg ,. fluticasone  (FLONASE ) 50 MCG/ACT nasal spray Diclofenac  Sodium Topical Gel 1%  Has the patient contacted their pharmacy? No (Agent: If no, request that the patient contact the pharmacy for the refill. If patient does not wish to contact the pharmacy document the reason why and proceed with request.) (Agent: If yes, when and what did the pharmacy advise?)  This is the patient's preferred pharmacy:  [facility] - [place], [place] - [address] [place] [address] Phone: [phone] Fax: [phone]  Is this the correct pharmacy for this prescription? Yes If no, delete pharmacy and type the correct one.   Has the prescription been filled recently? Yes  Is the patient out of the medication? No- need new prescription/ refills   Has the patient been seen for an appointment in the last year OR does the patient have an upcoming appointment? Yes  Can we respond through MyChart? No, patient asked for a call back at [phone]  Agent: Please be advised that Rx refills may take up to 3 business days. We ask that you follow-up with your pharmacy.

## 2024-02-02 NOTE — Telephone Encounter (Signed)
 Last office visit 12/18/23 for hand pain/HTN. Ibuprofen  and diclofenac  gel is not on current medication list.  No future appointments.

## 2024-02-03 MED ORDER — GABAPENTIN 100 MG PO CAPS: 200.0000 mg | ORAL_CAPSULE | Freq: Two times a day (BID) | ORAL | 3 refills | Status: AC

## 2024-02-03 MED ORDER — DICLOFENAC SODIUM 1 % EX GEL: 4.0000 g | Freq: Four times a day (QID) | CUTANEOUS | 5 refills | Status: AC

## 2024-02-03 MED ORDER — IBUPROFEN 800 MG PO TABS: 800.0000 mg | ORAL_TABLET | Freq: Three times a day (TID) | ORAL | 1 refills | Status: DC | PRN

## 2024-02-09 ENCOUNTER — Ambulatory Visit: Payer: Self-pay

## 2024-02-09 NOTE — Telephone Encounter (Signed)
 FYI Only or Action Required?: FYI only for provider.  Patient was last seen in primary care on 12/18/2023 by [name], MD.  Called Nurse Triage reporting Recurrent Skin Infections.  Symptoms began a week ago.  Interventions attempted: Rest, hydration, or home remedies.  Symptoms are: gradually worsening.  Triage Disposition: See PCP When Office is Open (Within 3 Days)  Patient/caregiver understands and will follow disposition?:    Copied from CRM #[id number]. Topic: Clinical - Red Word Triage >> Feb 09, 2024 10:02 AM Avram MATSU wrote: Red Word that prompted transfer to Nurse Triage: boil on her leg, red ring around it and painful.   ----------------------------------------------------------------------- From previous Reason for Contact - Scheduling: Patient/patient representative is calling to schedule an appointment. Refer to attachments for appointment information. Reason for Disposition  Boil > 1/2 inch across (> 12 mm; larger than a marble)  Answer Assessment - Initial Assessment Questions 1. APPEARANCE of BOIL: What does the boil look like?      Has a large red ring around, area is turning black 2. LOCATION: Where is the boil located?      Left top of leg 3. NUMBER: How many boils are there?      1 4. SIZE: How big is the boil? (e.g., inches, cm; compare to size of a coin or other object)     unsure 5. ONSET: When did the boil start?     X week 6. PAIN: Is there any pain? If Yes, ask: How bad is the pain?   (Scale 1-10; or mild, moderate, severe)     7/10 7. FEVER: Do you have a fever? If Yes, ask: What is it, how was it measured, and when did it start?      no 8. SOURCE: Have you been around anyone with boils or other Staph infections? Have you ever had boils before?     unsure 9. OTHER SYMPTOMS: Do you have any other symptoms? (e.g., shaking chills, weakness, rash elsewhere on body)     no 10. PREGNANCY: Is there any chance you are  pregnant? When was your last menstrual period?       na  Protocols used: Boil (Skin Abscess)-A-AH

## 2024-02-10 NOTE — Telephone Encounter (Signed)
 Patient cancelled appointment scheduled for 02/11/24 with Dr. Avelina.

## 2024-02-10 NOTE — Telephone Encounter (Signed)
 Noted

## 2024-02-11 ENCOUNTER — Ambulatory Visit: Admitting: Family Medicine

## 2024-03-18 DIAGNOSIS — H401231 Low-tension glaucoma, bilateral, mild stage: Secondary | ICD-10-CM | POA: Diagnosis not present

## 2024-03-19 DIAGNOSIS — S42291D Other displaced fracture of upper end of right humerus, subsequent encounter for fracture with routine healing: Secondary | ICD-10-CM | POA: Diagnosis not present

## 2024-03-19 DIAGNOSIS — M79601 Pain in right arm: Secondary | ICD-10-CM | POA: Diagnosis not present

## 2024-03-19 DIAGNOSIS — Z89511 Acquired absence of right leg below knee: Secondary | ICD-10-CM | POA: Diagnosis not present

## 2024-03-19 DIAGNOSIS — Z8781 Personal history of (healed) traumatic fracture: Secondary | ICD-10-CM | POA: Diagnosis not present

## 2024-03-19 DIAGNOSIS — Z96611 Presence of right artificial shoulder joint: Secondary | ICD-10-CM | POA: Diagnosis not present

## 2024-03-19 DIAGNOSIS — G5603 Carpal tunnel syndrome, bilateral upper limbs: Secondary | ICD-10-CM | POA: Diagnosis not present

## 2024-03-19 DIAGNOSIS — Z9889 Other specified postprocedural states: Secondary | ICD-10-CM | POA: Diagnosis not present

## 2024-04-06 ENCOUNTER — Other Ambulatory Visit: Payer: Self-pay | Admitting: Family

## 2024-04-06 ENCOUNTER — Other Ambulatory Visit: Payer: Self-pay | Admitting: Family Medicine

## 2024-04-06 DIAGNOSIS — K219 Gastro-esophageal reflux disease without esophagitis: Secondary | ICD-10-CM

## 2024-04-06 DIAGNOSIS — F319 Bipolar disorder, unspecified: Secondary | ICD-10-CM

## 2024-04-06 NOTE — Telephone Encounter (Signed)
 Please try and schedule Medicare Wellness with Erminio and CPE with fasting labs prior with Dr. Avelina.

## 2024-04-16 ENCOUNTER — Other Ambulatory Visit: Payer: Self-pay | Admitting: Family Medicine

## 2024-04-16 NOTE — Telephone Encounter (Signed)
 Last office visit 12/18/2023 for HTN, Bipolar depression, Right Hand Pain, Frequent Falls and Chronic pain.  Last refilled 02/03/2024 for #30 with 1 refill.  Next appt: CPE 05/11/2024.

## 2024-05-07 ENCOUNTER — Ambulatory Visit: Payer: Self-pay

## 2024-05-07 NOTE — Telephone Encounter (Signed)
 FYI Only or Action Required?: FYI only for provider: will call orthopedic office or go to urgent care.  Patient was last seen in primary care on 12/18/2023 by [name], MD.  Called Nurse Triage reporting Fall and Hand Injury.  Symptoms began yesterday.  Interventions attempted: Ice/heat application.  Symptoms are: gradually worsening.  Triage Disposition: See HCP Within 4 Hours (Or PCP Triage)  Patient/caregiver understands and will follow disposition?: Yes               Copied from CRM #[id number]. Topic: Clinical - Red Word Triage >> May 07, 2024  8:05 AM Sydney Evans wrote: Red Word that prompted transfer to Nurse Triage: [name] yesterday and used left hand to catch her fall. No bruising- Swollen and very painful. Unable to hold anything. Can move wrist and fingers. Reason for Disposition  [1] SEVERE pain (e.g., excruciating, unable to use hand at all) AND [2] not improved 2 hours after pain medicine/ice packs  Answer Assessment - Initial Assessment Questions 1. MECHANISM: How did the injury happen?     She went to sit down on walker but didn't lock it, walker slipped out from under her and caught the fall with her left hand.  2. ONSET: When did the injury happen? (e.g., minutes, hours ago)      Teacher, [name]  3. APPEARANCE of INJURY: What does the injury look like?      No bruising or open cuts/wounds but large amount of swelling to left hand.  4. SEVERITY: Can you use your hand normally? Can you bend your fingers into a ball and then fully open them?     No, she can close her fist not fully and can not hold onto objects. She has weakness in left hand. She states she can move wrist, hand and fingers.  5. SIZE: For cuts, bruises, or swelling, ask: How large is it? (e.g., inches or centimeters; entire hand)      Large bruising to left hand.  6. PAIN: How bad is the pain? (Scale 0-10; or none, mild, moderate, severe)     8/10, throbbing. She states she has  been using ice packs.  7. TETANUS: For any breaks in the skin, ask: When was your last tetanus booster?     N/A.  8. OTHER SYMPTOMS: Do you have any other symptoms?      No numbness in left hand or fingers.  Protocols used: Hand Injury-A-AH

## 2024-05-07 NOTE — Telephone Encounter (Signed)
 Noted

## 2024-05-11 ENCOUNTER — Encounter: Admitting: Family Medicine

## 2024-06-06 ENCOUNTER — Other Ambulatory Visit: Payer: Self-pay | Admitting: Family Medicine

## 2024-06-08 NOTE — Telephone Encounter (Signed)
 Please schedule CPE with Dr. Avelina after 07/09/2024.

## 2024-06-09 NOTE — Telephone Encounter (Signed)
"  Lvm to call office     "

## 2024-06-11 NOTE — Telephone Encounter (Signed)
 lvm for pt to call office to schedule appt.

## 2024-07-09 ENCOUNTER — Ambulatory Visit
